# Patient Record
Sex: Male | Born: 1935 | Race: White | Hispanic: No | Marital: Single | Smoking: Former smoker
Health system: Southern US, Community
[De-identification: ages and names within clinical notes are randomized; demographics above are authoritative.]

## PROBLEM LIST (undated history)

## (undated) DIAGNOSIS — N390 Urinary tract infection, site not specified: Secondary | ICD-10-CM

## (undated) DIAGNOSIS — K219 Gastro-esophageal reflux disease without esophagitis: Secondary | ICD-10-CM

## (undated) DIAGNOSIS — E785 Hyperlipidemia, unspecified: Secondary | ICD-10-CM

## (undated) DIAGNOSIS — I219 Acute myocardial infarction, unspecified: Secondary | ICD-10-CM

## (undated) DIAGNOSIS — K851 Biliary acute pancreatitis without necrosis or infection: Secondary | ICD-10-CM

## (undated) DIAGNOSIS — I2510 Atherosclerotic heart disease of native coronary artery without angina pectoris: Secondary | ICD-10-CM

## (undated) DIAGNOSIS — D696 Thrombocytopenia, unspecified: Secondary | ICD-10-CM

## (undated) DIAGNOSIS — N183 Chronic kidney disease, stage 3 unspecified: Secondary | ICD-10-CM

## (undated) DIAGNOSIS — I5042 Chronic combined systolic (congestive) and diastolic (congestive) heart failure: Secondary | ICD-10-CM

## (undated) DIAGNOSIS — H269 Unspecified cataract: Secondary | ICD-10-CM

## (undated) DIAGNOSIS — M1990 Unspecified osteoarthritis, unspecified site: Secondary | ICD-10-CM

## (undated) DIAGNOSIS — Z9981 Dependence on supplemental oxygen: Secondary | ICD-10-CM

## (undated) DIAGNOSIS — C801 Malignant (primary) neoplasm, unspecified: Secondary | ICD-10-CM

## (undated) DIAGNOSIS — I10 Essential (primary) hypertension: Secondary | ICD-10-CM

## (undated) DIAGNOSIS — I739 Peripheral vascular disease, unspecified: Secondary | ICD-10-CM

## (undated) DIAGNOSIS — E875 Hyperkalemia: Secondary | ICD-10-CM

## (undated) DIAGNOSIS — G4730 Sleep apnea, unspecified: Secondary | ICD-10-CM

## (undated) DIAGNOSIS — M109 Gout, unspecified: Secondary | ICD-10-CM

## (undated) HISTORY — DX: Hyperkalemia: E87.5

## (undated) HISTORY — DX: Essential (primary) hypertension: I10

## (undated) HISTORY — PX: UPPER GI ENDOSCOPY: SHX6162

## (undated) HISTORY — PX: EYE SURGERY: SHX253

## (undated) HISTORY — DX: Unspecified cataract: H26.9

## (undated) HISTORY — PX: APPENDECTOMY: SHX54

## (undated) HISTORY — DX: Atherosclerotic heart disease of native coronary artery without angina pectoris: I25.10

## (undated) HISTORY — DX: Sleep apnea, unspecified: G47.30

## (undated) HISTORY — PX: HERNIA REPAIR: SHX51

## (undated) HISTORY — DX: Hyperlipidemia, unspecified: E78.5

## (undated) HISTORY — PX: CHOLECYSTECTOMY: SHX55

## (undated) HISTORY — PX: OTHER SURGICAL HISTORY: SHX169

## (undated) HISTORY — DX: Thrombocytopenia, unspecified: D69.6

## (undated) HISTORY — PX: PARTIAL COLECTOMY: SHX5273

## (undated) HISTORY — DX: Acute myocardial infarction, unspecified: I21.9

## (undated) HISTORY — PX: COLONOSCOPY: SHX174

## (undated) HISTORY — PX: BACK SURGERY: SHX140

## (undated) HISTORY — DX: Unspecified osteoarthritis, unspecified site: M19.90

---

## 1990-10-11 DIAGNOSIS — E119 Type 2 diabetes mellitus without complications: Secondary | ICD-10-CM | POA: Insufficient documentation

## 1995-10-12 DIAGNOSIS — I219 Acute myocardial infarction, unspecified: Secondary | ICD-10-CM

## 1995-10-12 HISTORY — PX: CORONARY ARTERY BYPASS GRAFT: SHX141

## 1995-10-12 HISTORY — DX: Acute myocardial infarction, unspecified: I21.9

## 1998-07-09 ENCOUNTER — Other Ambulatory Visit: Admission: RE | Admit: 1998-07-09 | Discharge: 1998-07-09 | Payer: Self-pay | Admitting: Gastroenterology

## 2000-11-22 ENCOUNTER — Emergency Department (HOSPITAL_COMMUNITY): Admission: EM | Admit: 2000-11-22 | Discharge: 2000-11-22 | Payer: Self-pay

## 2000-11-23 ENCOUNTER — Ambulatory Visit (HOSPITAL_COMMUNITY): Admission: RE | Admit: 2000-11-23 | Discharge: 2000-11-23 | Payer: Self-pay | Admitting: Gastroenterology

## 2000-11-23 ENCOUNTER — Encounter: Payer: Self-pay | Admitting: Gastroenterology

## 2000-11-25 ENCOUNTER — Observation Stay (HOSPITAL_COMMUNITY): Admission: RE | Admit: 2000-11-25 | Discharge: 2000-11-26 | Payer: Self-pay | Admitting: Gastroenterology

## 2000-11-25 ENCOUNTER — Encounter: Payer: Self-pay | Admitting: Gastroenterology

## 2000-11-25 ENCOUNTER — Encounter (INDEPENDENT_AMBULATORY_CARE_PROVIDER_SITE_OTHER): Payer: Self-pay | Admitting: Specialist

## 2000-11-28 ENCOUNTER — Ambulatory Visit (HOSPITAL_COMMUNITY): Admission: RE | Admit: 2000-11-28 | Discharge: 2000-11-28 | Payer: Self-pay

## 2001-07-10 ENCOUNTER — Other Ambulatory Visit: Admission: RE | Admit: 2001-07-10 | Discharge: 2001-07-10 | Payer: Self-pay | Admitting: Gastroenterology

## 2001-07-10 ENCOUNTER — Encounter (INDEPENDENT_AMBULATORY_CARE_PROVIDER_SITE_OTHER): Payer: Self-pay

## 2003-10-12 DIAGNOSIS — Z86010 Personal history of colonic polyps: Secondary | ICD-10-CM | POA: Insufficient documentation

## 2003-10-12 DIAGNOSIS — C801 Malignant (primary) neoplasm, unspecified: Secondary | ICD-10-CM

## 2003-10-12 HISTORY — DX: Malignant (primary) neoplasm, unspecified: C80.1

## 2003-10-12 HISTORY — PX: CARDIAC CATHETERIZATION: SHX172

## 2004-07-21 ENCOUNTER — Inpatient Hospital Stay (HOSPITAL_COMMUNITY): Admission: EM | Admit: 2004-07-21 | Discharge: 2004-07-25 | Payer: Self-pay | Admitting: Emergency Medicine

## 2006-05-03 ENCOUNTER — Emergency Department (HOSPITAL_COMMUNITY): Admission: EM | Admit: 2006-05-03 | Discharge: 2006-05-03 | Payer: Self-pay | Admitting: Emergency Medicine

## 2009-10-11 DIAGNOSIS — D696 Thrombocytopenia, unspecified: Secondary | ICD-10-CM

## 2009-10-11 HISTORY — PX: CARDIAC CATHETERIZATION: SHX172

## 2009-10-11 HISTORY — DX: Thrombocytopenia, unspecified: D69.6

## 2009-11-14 ENCOUNTER — Encounter: Admission: RE | Admit: 2009-11-14 | Discharge: 2009-11-14 | Payer: Self-pay | Admitting: Cardiology

## 2009-11-21 ENCOUNTER — Inpatient Hospital Stay (HOSPITAL_BASED_OUTPATIENT_CLINIC_OR_DEPARTMENT_OTHER): Admission: RE | Admit: 2009-11-21 | Discharge: 2009-11-21 | Payer: Self-pay | Admitting: Cardiology

## 2009-11-25 ENCOUNTER — Inpatient Hospital Stay (HOSPITAL_COMMUNITY): Admission: RE | Admit: 2009-11-25 | Discharge: 2009-11-26 | Payer: Self-pay | Admitting: Cardiology

## 2010-05-21 ENCOUNTER — Ambulatory Visit: Payer: Self-pay | Admitting: Cardiology

## 2010-06-04 ENCOUNTER — Ambulatory Visit: Payer: Self-pay | Admitting: Cardiology

## 2010-12-30 LAB — BASIC METABOLIC PANEL
BUN: 22 mg/dL (ref 6–23)
CO2: 26 mEq/L (ref 19–32)
Calcium: 8.8 mg/dL (ref 8.4–10.5)
Chloride: 104 mEq/L (ref 96–112)
Creatinine, Ser: 1.3 mg/dL (ref 0.4–1.5)
GFR calc Af Amer: >60 mL/min (ref >60)
GFR calc non Af Amer: 54 mL/min — ABNORMAL LOW (ref >60)
Glucose, Bld: 156 mg/dL — ABNORMAL HIGH (ref 70–99)
Potassium: 4.7 mEq/L (ref 3.5–5.1)
Sodium: 138 mEq/L (ref 135–145)

## 2010-12-30 LAB — GLUCOSE, CAPILLARY
Glucose-Capillary: 118 mg/dL — ABNORMAL HIGH (ref 70–99)
Glucose-Capillary: 145 mg/dL — ABNORMAL HIGH (ref 70–99)
Glucose-Capillary: 155 mg/dL — ABNORMAL HIGH (ref 70–99)
Glucose-Capillary: 74 mg/dL (ref 70–99)

## 2010-12-30 LAB — CBC
HCT: 40.3 % (ref 39.0–52.0)
Hemoglobin: 13.8 g/dL (ref 13.0–17.0)
MCHC: 34.2 g/dL (ref 30.0–36.0)
MCV: 93.6 fL (ref 78.0–100.0)
Platelets: 94 10*3/uL — ABNORMAL LOW (ref 150–400)
RBC: 4.31 MIL/uL (ref 4.22–5.81)
RDW: 14.6 % (ref 11.5–15.5)
WBC: 6.1 10*3/uL (ref 4.0–10.5)

## 2011-02-26 NOTE — Discharge Summary (Signed)
NAMEMOBEEN, KUTZNER NO.:  [id number]   MEDICAL RECORD NO.:  [id number]          PATIENT TYPE:  INP   LOCATION:  2928                         FACILITY:  [facility]   PHYSICIAN:  Kaylyn Lim., M.D.DATE OF BIRTH:  1933/04/16   DATE OF ADMISSION:  07/21/2004  DATE OF DISCHARGE:  07/25/2004                                 DISCHARGE SUMMARY   DISCHARGE DIAGNOSES:  1.  Non-ST elevation myocardial infarction, peak troponin 0.9.  2.  Coronary artery disease with 99% blockage in the vein graft to PLV      artery, status post successful percutaneous coronary intervention and      Taxus stent placement.  3.  Dyslipidemia.  4.  Diabetes mellitus.  5.  Hypertension.   HISTORY OF PRESENT ILLNESS AND HOSPITAL COURSE:  The patient is a 77-year-  old white male admitted with non-ST elevation MI.  He underwent cardiac  catheterization on July 23, 2004 which showed significant native vessel  disease as well as disease to his vein grafts.  The vein graft to the PDA  had an ostial 40% stenosis followed by proximal 50-60% stenosis.  Mid and  distal vein grafts had no significant disease.  Vein graft to the PLV  artery.  He had a 99% proximal stenosis with  TIMI-1 flow in the mid and  distal vessels.  It was noted that the LAD was patent without any  significant disease.  Due to contrast load and the patient's baseline  creatinine of 1.4, PCI was staged for July 24, 2004.  At that time the  patient underwent successful PCI to the vein graft to its PLV with Taxus  stent placement x2.  With a 3.5 x 16 mm Taxus stent deployed followed by 3.5  x 12 mm Taxus stent deployed.  The patient had no residual stenosis and TIMI-  3 flow throughout the vein graft.   Post procedure the patient did well.  He has had no significant complaints.  He has been ambulatory in the room and tolerating p.o. diet well.  He will  be discharged home today to continue the following medications.   DISCHARGE MEDICATIONS:  1.  Lisinopril 40 mg daily.  2.  Glucotrol XL 5 mg daily.  3.  Zocor 40 mg daily.  4.  Protonix 40 mg daily.  5.  Metoprolol 25 mg b.i.d.  6.  Plavix 75 mg daily.  7.  Aspirin 325 mg daily.  8.  Insulin as before.  9.  Pain management is Tylenol 500 mg q.6h. p.r.n.   ACTIVITY:  The patient was advised to do no strenuous activity for 48 hours  and to restrict his normal workouts for one week.   DIET:  ADA 2000 calorie.   WOUND CARE:  Routine post catheterization care.   FOLLOW UP:  He will follow up with [facility] in one to two weeks  with either Dr. Verlon Setting, Dr. Doreatha Lew or Dr. Martinique.  The patient will call  the office at [phone] for an appointment.  All the patient's questions were  answered prior  to discharge.       TWK/MEDQ  D:  07/25/2004  T:  07/25/2004  Job:  [id number]

## 2011-02-26 NOTE — Op Note (Signed)
[facility]  Patient:    Scott [name], Scott [name]                         MRN: [id number] Proc. Date: 11/25/00 Adm. Date:  [id number] Attending:  Tiffany Kocher CC:         Sandy Salaam. Deatra Ina, M.D. [facility]   Operative Report  PREOPERATIVE DIAGNOSIS:  Acute cholecystitis and cholelithiasis.  POSTOPERATIVE DIAGNOSIS:  Acute gangrenous cholecystitis and cholelithiasis.  OPERATION:  Laparoscopic cholecystectomy.  SURGEON:  Ward Givens, M.D.  ANESTHESIA: General.  DESCRIPTION OF PROCEDURE:  After adequate induction of anesthesia, adequate monitoring, and routine preparation and draping of the abdomen, I made a short transverse infraumbilical incision and dissected down a small umbilical hernia, opened it, reduced the fat, and dissected down to the fascia.  The hernia defect was about needed size for putting in the scope.  I had good access to the peritoneal cavity.  I placed a pursestring O Vicryl suture and secured a Hasson cannula.  I then inflated the abdomen with CO2 and did a laparoscopy.  I found there was adhesion of omentum to the undersurface of the gallbladder and liver, but otherwise I saw no abnormalities.  After placement of three additional ports and positioning the patient head up, foot down, tilted to the left, I dissected the adhesions away from the fundus of the gallbladder.  I drained the gallbladder with suction aspirator so I could grasp it.  Then I grasped the fundus and elevated it toward the right shoulder.  There was a lot of adhesion to the undersurface of the gallbladder as it was very acutely inflamed.  The gallbladder was evidently necrotic, the integrity was not good, and there was some spillage of bile from it.  I sucked that out as I went along.  No gross stones were spilled.  I then dissected on down the gallbladder, placing another clamp on the infundibulum and retracting it to the right, and I was able to identify the  cystic artery, clip it with three clips, and cut between the two clips closer to the gallbladder.  I dissected on further and clearly saw the infundibulum of the gallbladder merging into the cystic duct.  I clipped the cystic duct with three clips and cut between the the two closer to the gallbladder.  I then elevated the gallbladder out of the gallbladder fossa.  As I came up toward the fundus, the gallbladder was quite necrotic, and integrity was poor, and I simply removed the majority of the gallbladder, leaving the posterior wall intact against the liver.  I cauterized the remaining mucosa, although it was necrotic.  I got hemostasis with the cautery.  I copiously irrigated the right upper quadrant and removed the irrigant.  I placed the gallbladder into a plastic pouch and removed it through the umbilical incision, then replaced the Hasson cannula and, under direct vision, placed a suction drain through one of the lateral ports into the gallbladder fossa and secured it was 2-0 nylon.  I then again inspected the area and found that there was good hemostasis and no evident leakage of bile.  I anesthetized all of the incisions with long-acting local anesthetic.  I closed the pursestring suture in the umbilical incision, and that closed it nicely.  I removed the CO2 and removed the other ports and closed all the incisions with intracuticular 4-0 Vicryl and Steri-Strips.  I applied bandages, and the patient went to  the recovery room in satisfactory condition. DD:  11/25/00 TD:  11/26/00 Job: [id number]

## 2011-02-26 NOTE — Cardiovascular Report (Signed)
[name], [name] NO.:  [id number]   MEDICAL RECORD NO.:  [id number]          PATIENT TYPE:  INP   LOCATION:  2034                         FACILITY:  [facility]   PHYSICIAN:  Kaylyn Lim., M.D.DATE OF BIRTH:  1933-01-02   DATE OF PROCEDURE:  07/23/2004  DATE OF DISCHARGE:                              CARDIAC CATHETERIZATION   PROCEDURES:  Left heart catheterization, graft angiogram, LIMA angiogram, LV  angiogram.   DESCRIPTION OF PROCEDURE:  The patient was brought to the cardiac  catheterization lab after appropriate and informed consent.  He was prepped  and draped in sterile fashion.  A 6-French sheath was placed in the right  femoral artery without difficulty.  Coronary angiography, __________ graft  angiography, LIMA angiography and LV angiography were then performed without  difficulty.   RESULTS:  Left Main:  Long lung vessel with a distal 30-40% stenosis.  LAD:  Has a proximal 60-70% diffuse disease followed by 100% mid occlusion.  Distal vessels seen with milking flow.  LCX:  Proximal 30-40% stenosis.  OM1:  Large vessel with minimal disease.  OM2:  Large vessel with minimal disease.  RCA:  Dominant with proximal ectasia with sequential 40-50% stenosis and  100% mid occlusion.   VEIN GRAFTS:  Vein Graft to the PDA:  Ostial 40% stenosis followed by a  proximal hazy 50-60% stenosis.  Mid and distal vein graft without  significant disease.  Fill in a PDA with no significant disease.  Vein Graft to the PLV:  Has a proximal 99% stenosis with a TIMI I flow in  the mid and distal vessel filling a PLV vessel with mild luminal  irregularities.  LIMA to LAD is patent with no significant disease filling  the mid LAD and back filling small diagonals.  LV:  EF was 45-50% with moderate to severe diaphragmatic hypokinesis.  LVEDP  preinjection was 14 mmHg, post was 32 mmHg.   IMPRESSION:  1.  Obstructive native three vessel disease.  2.  Patent vein graft  to the PDA with a 50-60% proximal stenosis.  Vein      graft to the PLV with 99% proximal stenosis and patent LIMA to the LAD      with no significant blockage.  3.  Mild LV dysfunction with an EF of 45-50% and moderate to severe      diaphragmatic hypokinesis.   PLAN:  1.  Maximal medical therapy including heparin, Integrilin, aspirin, Plavix,      nitroglycerin and beta blockade.  2.  Staged intervention of vein graft to the PLV tomorrow secondary to      patient with renal insufficiency based on creatinine of 1.4.      Approximately 155 cc of Omnipaque dye was used during the procedure.      The patient tolerated the procedure well and was transported to the      holding area in stable condition.       TWK/MEDQ  D:  07/23/2004  T:  07/23/2004  Job:  [id number]

## 2011-02-26 NOTE — Cardiovascular Report (Signed)
NAMEDENZALE, MOYA NO.:  [id number]   MEDICAL RECORD NO.:  [id number]          PATIENT TYPE:  INP   LOCATION:  2928                         FACILITY:  [facility]   PHYSICIAN:  Peter M. Martinique, M.D.  DATE OF BIRTH:  06/05/1933   DATE OF PROCEDURE:  DATE OF DISCHARGE:                              CARDIAC CATHETERIZATION   INDICATIONS FOR PROCEDURE:  The patient is a 77 year old white male, status  post CABG eight years ago presents with a non-Q-wave myocardial infarction.  He has a critical lesion in the proximal saphenous vein graft  to the  posterolateral branch of the right coronary artery.  The procedure is  percutaneous stenting of the vein graft to the posterolateral branch.  Access is via the right femoral artery using the standard Seldinger  technique.   EQUIPMENT:  1.  6-French arterial sheath.  2.  6-French multi-purpose A-1 guide.  3.  A filter wire EZ.  4.  A 3.5 x 16 mm Taxus drug-eluting stent in a 3.5 x 12 mm Taxus drug-      eluting stent.   MEDICATIONS:  1.  Nitroglycerin drip.  2.  Nitroglycerin 200 mcg intracoronary x2.  3.  Heparin 4800 units IV.  4.  Integrilin drip was continued to 0.99 mcg/kg/per minute.  5.  ACT post heparin was 262.   DESCRIPTION OF PROCEDURE:  After initial __________  were obtained, we first  crossed the lesion with a filter wire.  The basket on the filter wire was  deployed in the distal saphenous vein graft.  We then primarily stented the  lesion of the proximal vein graft using a 3.5 x 16 mm Taxus stent.  This was  deployed at 8 atmospheres and then post dilated to 12 atmospheres.  This  yielded an excellent result to the stent site. However, at the distal stent  margin, there was a focal intimal disruption/dissection.  We next covered  this using an additional 3.5 x 12 mm Taxus stent.  This was again deployed  at 8 atmospheres post dilated to 12 atmospheres.  This yielded an excellent  angiographic result with  0% residual stenosis and a smooth appearance.  There was TIMI-3 flow to the far distal vessels.  No filling defects were  noted.  We then extracted the filter wire and examination revealed a small  amount of bland debris within the filter.  The patient tolerated the  procedure well without chest pain or ECG changes.   FINAL INTERPRETATION:  Successful stenting of the proximal saphenous vein  graft to the posterolateral branch of the right coronary artery.       PMJ/MEDQ  D:  07/24/2004  T:  07/24/2004  Job:  [id number]   cc:   Ludwig Lean. Doreatha Lew, M.D.  Fax: [phone]   Kaylyn Lim., M.D.  [address]  [place], [place] [address]  Fax: [phone]
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
## 2011-03-31 ENCOUNTER — Encounter: Payer: Self-pay | Admitting: *Deleted

## 2011-03-31 ENCOUNTER — Ambulatory Visit: Payer: Self-pay | Admitting: Cardiology

## 2011-04-01 ENCOUNTER — Encounter: Payer: Self-pay | Admitting: *Deleted

## 2011-04-07 ENCOUNTER — Ambulatory Visit (INDEPENDENT_AMBULATORY_CARE_PROVIDER_SITE_OTHER): Payer: Medicare Other | Admitting: Cardiology

## 2011-04-07 ENCOUNTER — Encounter: Payer: Self-pay | Admitting: Cardiology

## 2011-04-07 VITALS — BP 122/72 | HR 54 | Ht 72.0 in | Wt 217.0 lb

## 2011-04-07 DIAGNOSIS — E119 Type 2 diabetes mellitus without complications: Secondary | ICD-10-CM | POA: Insufficient documentation

## 2011-04-07 DIAGNOSIS — I219 Acute myocardial infarction, unspecified: Secondary | ICD-10-CM | POA: Insufficient documentation

## 2011-04-07 DIAGNOSIS — E785 Hyperlipidemia, unspecified: Secondary | ICD-10-CM

## 2011-04-07 DIAGNOSIS — I2510 Atherosclerotic heart disease of native coronary artery without angina pectoris: Secondary | ICD-10-CM

## 2011-04-07 DIAGNOSIS — I259 Chronic ischemic heart disease, unspecified: Secondary | ICD-10-CM

## 2011-04-07 DIAGNOSIS — I255 Ischemic cardiomyopathy: Secondary | ICD-10-CM | POA: Insufficient documentation

## 2011-04-07 DIAGNOSIS — I10 Essential (primary) hypertension: Secondary | ICD-10-CM

## 2011-04-07 NOTE — Patient Instructions (Signed)
Continue your current medications.  Get me a copy of your lab work from the [facility] when you go.  I will see you back in 6 months.

## 2011-04-07 NOTE — Progress Notes (Signed)
[name] Date of Birth: 1933-06-16   History of Present Illness: Scott [name] is seen for assumption of care. He is a former patient of Dr. Doreatha Lew. He has a history of coronary disease and is status post myocardial infarction 1997. He at coronary bypass surgery x3 at that time. This included an LIMA graft to LAD, saphenous vein graft to the PDA, saphenous vein graft to the posterior lateral branch. In October 2005 he had stenting of the vein graft to the posterior lateral branch. In 2011 he had stenting of the proximal portion of this vein graft to the posterior lateral branch. On followup today he states he is feeling well. [facility]. He denies any significant left arm pain or chest pain which was his prior anginal symptom.   Current Outpatient Prescriptions on File Prior to Visit  Medication Sig Dispense Refill  . allopurinol (ZYLOPRIM) 100 MG tablet Take 200 mg by mouth daily.       Marland Kitchen aspirin 81 MG EC tablet Take 81 mg by mouth daily.        . clopidogrel (PLAVIX) 75 MG tablet Take 75 mg by mouth daily.        . colchicine 0.6 MG tablet Take 0.6 mg by mouth as needed.        . diclofenac (VOLTAREN) 75 MG EC tablet Take 75 mg by mouth daily.        . hydrochlorothiazide 25 MG tablet Take 12.5 mg by mouth daily.       . insulin aspart (NOVOLOG) 100 UNIT/ML injection Inject into the skin as directed.        . labetalol (NORMODYNE) 200 MG tablet Take 200 mg by mouth 2 (two) times daily.        . Multiple Vitamin (MULTIVITAMIN) tablet Take 1 tablet by mouth daily.        Marland Kitchen OMEPRAZOLE PO Take 1 capsule by mouth daily.        . simvastatin (ZOCOR) 20 MG tablet Take 10 mg by mouth at bedtime.          No Known Allergies  Past Medical History  Diagnosis Date  . Hypertension   . Diabetes mellitus   . Ischemic heart disease     dates back to 1997 --   . Myocardial infarction     had CABG x3 -- left internal mammary to the LAD, vein graft to the  posterior descending, and a vein graft to the posterior lateral branch  . Hyperkalemia   . Thrombocytopenia   . Shortness of breath   . Dyslipidemia   . Arthritis   . Gout     Past Surgical History  Procedure Date  . Coronary artery bypass graft 1997     x3 -- left internal mammary to the LAD, vein graft to the posterior descending, and a vein graft to the posterior lateral branch  . Cardiac catheterization 2005    stenting of the vein graft to the posterior lateral per -- Dr. Martinique      . Cardiac catheterization 2011    showed right coronary, totally occluded LAD and severe stenosis in both saphenous vein graft to the posterior lateral and posterior descending  -- stenting of the proximal portion of the SVG to the posterior lateral branch in 2/11  . Appendectomy   . Cholecystectomy     History  Smoking status  . Former Smoker  . Quit  date: 10/11/1986  Smokeless tobacco  . Not on file    History  Alcohol Use No    Family History  Problem Relation Age of Onset  . Coronary artery disease Father   . Heart attack Father   . Coronary artery disease Brother     1/2 brother   . Kidney disease Sister   . Cancer Cousin   . Cardiomyopathy Mother     Review of Systems: The review of systems is positive for elevated blood sugars particularly morning sometimes up to 200. He is on insulin therapy. His diabetes and medical problems are managed at the [facility].  All other systems were reviewed and are negative.  Physical Exam: BP 122/72  Pulse 54  Ht 6' (1.829 m)  Wt 217 lb (98.431 kg)  BMI 29.43 kg/m2 He is an overweight white male in no acute distress. He is normocephalic, atraumatic. Pupils are equal round and reactive to light and accommodation. Sclera are clear. Oropharynx is clear. Neck is supple without JVD, adenopathy, thyromegaly, or bruits. Lungs are clear. Cardiac exam reveals a regular rate and rhythm without gallop, murmur, or click. Abdomen is obese, soft, nontender. He  has no masses or bruits. Femoral and pedal pulses are 2+ and symmetric. He has no significant edema. He is alert and oriented x3. Cranial nerves II through XII are intact. LABORATORY DATA: ECG demonstrates sinus bradycardia with sinus arrhythmia. He has nonspecific ST and T-wave changes which are unchanged from January of 2011.  Assessment / Plan:

## 2011-04-07 NOTE — Assessment & Plan Note (Signed)
Blood pressure control is acceptable on current medications.

## 2011-04-07 NOTE — Assessment & Plan Note (Signed)
He is having no significant anginal symptoms at this time. We need to continue aggressive risk factor modification. Given his significant vein graft disease with prior stents I would recommend dual antiplatelet therapy indefinitely.

## 2011-04-07 NOTE — Assessment & Plan Note (Signed)
I have requested a copy of his most recent lab work from the [facility]. I will plan to followup again in 6 months.

## 2011-04-08 ENCOUNTER — Encounter: Payer: Self-pay | Admitting: Cardiology

## 2011-11-03 ENCOUNTER — Ambulatory Visit (INDEPENDENT_AMBULATORY_CARE_PROVIDER_SITE_OTHER): Payer: Medicare Other

## 2011-11-03 ENCOUNTER — Encounter: Payer: Self-pay | Admitting: Cardiology

## 2011-11-03 DIAGNOSIS — E119 Type 2 diabetes mellitus without complications: Secondary | ICD-10-CM

## 2011-11-03 DIAGNOSIS — I2510 Atherosclerotic heart disease of native coronary artery without angina pectoris: Secondary | ICD-10-CM

## 2011-11-03 DIAGNOSIS — R0602 Shortness of breath: Secondary | ICD-10-CM

## 2011-11-03 DIAGNOSIS — R059 Cough, unspecified: Secondary | ICD-10-CM

## 2011-11-03 DIAGNOSIS — R05 Cough: Secondary | ICD-10-CM

## 2011-11-05 ENCOUNTER — Encounter: Payer: Self-pay | Admitting: Cardiology

## 2011-11-05 ENCOUNTER — Ambulatory Visit (INDEPENDENT_AMBULATORY_CARE_PROVIDER_SITE_OTHER): Payer: Medicare Other | Admitting: Cardiology

## 2011-11-05 VITALS — BP 118/60 | HR 53 | Ht 72.0 in | Wt 213.4 lb

## 2011-11-05 DIAGNOSIS — R0600 Dyspnea, unspecified: Secondary | ICD-10-CM | POA: Insufficient documentation

## 2011-11-05 DIAGNOSIS — R0989 Other specified symptoms and signs involving the circulatory and respiratory systems: Secondary | ICD-10-CM

## 2011-11-05 DIAGNOSIS — I2510 Atherosclerotic heart disease of native coronary artery without angina pectoris: Secondary | ICD-10-CM

## 2011-11-05 DIAGNOSIS — R0609 Other forms of dyspnea: Secondary | ICD-10-CM

## 2011-11-05 NOTE — Assessment & Plan Note (Signed)
I suspect his symptoms are more related to pulmonary process given his recent flulike illness. However there is concern of component of congestive heart failure. His BNP level is elevated. His JVD is elevated. His lungs are clear today and he has no significant edema. I agree with the addition of diuretic therapy. We will obtain an echocardiogram. Prior LV function was mildly reduced related to his remote heart attack. I'll followup again in one month.

## 2011-11-05 NOTE — Assessment & Plan Note (Signed)
Prior coronary history as noted. He has no significant anginal symptoms. We will continue with his dual antiplatelet therapy and lipid-lowering therapy.

## 2011-11-05 NOTE — Progress Notes (Signed)
Scott [name] Date of Birth: Apr 29, 1933   History of Present Illness: [name] is seen for a followup visit. He has a history of coronary disease and is status post myocardial infarction 1997. He at coronary bypass surgery x3 at that time. This included an LIMA graft to LAD, saphenous vein graft to the PDA, saphenous vein graft to the posterior lateral branch. In October 2005 he had stenting of the vein graft to the posterior lateral branch. In 2011 he had stenting of the proximal portion of this vein graft to the posterior lateral branch. . He denies any significant left arm pain or chest pain which was his prior anginal symptom.  Around Christmastime he had an episode of flu. Since then he is still had persistent chest congestion and some cough. He did have a chest x-ray done at urgent medical care which by report showed a question left lower lobe retrocardiac infiltrate. There were some increased markings throughout. He had cardiomegaly. A BNP level was also done and was 890. We have no baseline for comparison. He was started on antibiotics and diuretic therapy. He feels that his symptoms are improving.  Current Outpatient Prescriptions on File Prior to Visit  Medication Sig Dispense Refill  . allopurinol (ZYLOPRIM) 100 MG tablet Take 200 mg by mouth daily.       Marland Kitchen aspirin 81 MG EC tablet Take 81 mg by mouth daily.        . clopidogrel (PLAVIX) 75 MG tablet Take 75 mg by mouth daily.        . colchicine 0.6 MG tablet Take 0.6 mg by mouth as needed.        . diclofenac (VOLTAREN) 75 MG EC tablet Take 75 mg by mouth daily.        . hydrochlorothiazide 25 MG tablet Take 12.5 mg by mouth daily.       . insulin aspart (NOVOLOG) 100 UNIT/ML injection Inject into the skin as directed.        . labetalol (NORMODYNE) 200 MG tablet Take 200 mg by mouth 2 (two) times daily.        . Multiple Vitamin (MULTIVITAMIN) tablet Take 1 tablet by mouth daily.        Marland Kitchen OMEPRAZOLE PO Take 1 capsule by mouth  daily.        . simvastatin (ZOCOR) 20 MG tablet Take 10 mg by mouth at bedtime.          No Known Allergies  Past Medical History  Diagnosis Date  . Hypertension   . Diabetes mellitus   . Ischemic heart disease     dates back to 1997 --   . Myocardial infarction     had CABG x3 -- left internal mammary to the LAD, vein graft to the posterior descending, and a vein graft to the posterior lateral branch  . Hyperkalemia   . Thrombocytopenia   . Shortness of breath   . Dyslipidemia   . Arthritis   . Gout   . CAD (coronary artery disease)     Past Surgical History  Procedure Date  . Coronary artery bypass graft 1997     x3 -- left internal mammary to the LAD, vein graft to the posterior descending, and a vein graft to the posterior lateral branch  . Cardiac catheterization 2005    stenting of the vein graft to the posterior lateral per -- Dr. Martinique      . Cardiac catheterization 2011  showed right coronary, totally occluded LAD and severe stenosis in both saphenous vein graft to the posterior lateral and posterior descending  -- stenting of the proximal portion of the SVG to the posterior lateral branch in 2/11  . Appendectomy   . Cholecystectomy     History  Smoking status  . Former Smoker  . Quit date: 10/11/1986  Smokeless tobacco  . Not on file    History  Alcohol Use No    Family History  Problem Relation Age of Onset  . Coronary artery disease Father   . Heart attack Father   . Coronary artery disease Brother     1/2 brother   . Kidney disease Sister   . Cancer Cousin   . Cardiomyopathy Mother     Review of Systems: As noted in history of present illness All other systems were reviewed and are negative.  Physical Exam: BP 118/60  Pulse 53  Ht 6' (1.829 m)  Wt 213 lb 6.4 oz (96.798 kg)  BMI 28.94 kg/m2 He is an overweight white male in no acute distress. He is normocephalic, atraumatic. Pupils are equal round and reactive to light and  accommodation. Sclera are clear. Oropharynx is clear. Neck is supple with JVD to 6 cm. There is no adenopathy, thyromegaly, or bruits. Lungs are clear. Cardiac exam reveals a regular rate and rhythm without gallop, murmur, or click. Abdomen is obese, soft, nontender. He has no masses or bruits. Femoral and pedal pulses are 2+ and symmetric. He has no significant edema. He is alert and oriented x3. Cranial nerves II through XII are intact. LABORATORY DATA: ECG demonstrates sinus bradycardia with first degree AV block. He has nonspecific ST and T-wave changes. Compared to prior ECG tracings there is increased T-wave inversion in the lateral leads but with less ST depression.  Assessment / Plan:

## 2011-11-05 NOTE — Patient Instructions (Signed)
We will schedule you for an echocardiogram.  Continue your current medication.  I will see you again in 1 month.

## 2011-11-12 ENCOUNTER — Ambulatory Visit (HOSPITAL_COMMUNITY): Payer: Medicare Other | Attending: Cardiology | Admitting: Radiology

## 2011-11-12 DIAGNOSIS — Z8249 Family history of ischemic heart disease and other diseases of the circulatory system: Secondary | ICD-10-CM | POA: Insufficient documentation

## 2011-11-12 DIAGNOSIS — I2510 Atherosclerotic heart disease of native coronary artery without angina pectoris: Secondary | ICD-10-CM

## 2011-11-12 DIAGNOSIS — R0609 Other forms of dyspnea: Secondary | ICD-10-CM | POA: Insufficient documentation

## 2011-11-12 DIAGNOSIS — I10 Essential (primary) hypertension: Secondary | ICD-10-CM | POA: Insufficient documentation

## 2011-11-12 DIAGNOSIS — I252 Old myocardial infarction: Secondary | ICD-10-CM | POA: Insufficient documentation

## 2011-11-12 DIAGNOSIS — R0600 Dyspnea, unspecified: Secondary | ICD-10-CM

## 2011-11-12 DIAGNOSIS — E119 Type 2 diabetes mellitus without complications: Secondary | ICD-10-CM | POA: Insufficient documentation

## 2011-11-12 DIAGNOSIS — Z87891 Personal history of nicotine dependence: Secondary | ICD-10-CM | POA: Insufficient documentation

## 2011-11-12 DIAGNOSIS — E785 Hyperlipidemia, unspecified: Secondary | ICD-10-CM | POA: Insufficient documentation

## 2011-11-12 DIAGNOSIS — R0989 Other specified symptoms and signs involving the circulatory and respiratory systems: Secondary | ICD-10-CM | POA: Insufficient documentation

## 2011-11-12 DIAGNOSIS — I2589 Other forms of chronic ischemic heart disease: Secondary | ICD-10-CM | POA: Insufficient documentation

## 2011-11-12 DIAGNOSIS — I517 Cardiomegaly: Secondary | ICD-10-CM | POA: Insufficient documentation

## 2011-12-10 ENCOUNTER — Ambulatory Visit (INDEPENDENT_AMBULATORY_CARE_PROVIDER_SITE_OTHER): Payer: Medicare Other | Admitting: Cardiology

## 2011-12-10 ENCOUNTER — Encounter: Payer: Self-pay | Admitting: Cardiology

## 2011-12-10 VITALS — BP 130/71 | HR 59 | Ht 72.0 in | Wt 215.0 lb

## 2011-12-10 DIAGNOSIS — R0609 Other forms of dyspnea: Secondary | ICD-10-CM

## 2011-12-10 DIAGNOSIS — E785 Hyperlipidemia, unspecified: Secondary | ICD-10-CM

## 2011-12-10 DIAGNOSIS — R0989 Other specified symptoms and signs involving the circulatory and respiratory systems: Secondary | ICD-10-CM

## 2011-12-10 DIAGNOSIS — I2789 Other specified pulmonary heart diseases: Secondary | ICD-10-CM

## 2011-12-10 DIAGNOSIS — I2510 Atherosclerotic heart disease of native coronary artery without angina pectoris: Secondary | ICD-10-CM

## 2011-12-10 DIAGNOSIS — R0600 Dyspnea, unspecified: Secondary | ICD-10-CM

## 2011-12-10 DIAGNOSIS — I509 Heart failure, unspecified: Secondary | ICD-10-CM

## 2011-12-10 DIAGNOSIS — I5032 Chronic diastolic (congestive) heart failure: Secondary | ICD-10-CM

## 2011-12-10 DIAGNOSIS — I272 Pulmonary hypertension, unspecified: Secondary | ICD-10-CM

## 2011-12-10 NOTE — Patient Instructions (Signed)
Continue your current medications. Exercise regularly.  Avoid sodium in your diet. Be careful to avoid canned or processed foods  I will see you again in 6 months.  Call if you have more shortness of breath or chest pain.

## 2011-12-10 NOTE — Progress Notes (Signed)
[name] Date of Birth: 1933-08-08   History of Present Illness: Mr. Pearson is seen for a followup visit. He has a history of coronary disease and is status post myocardial infarction 1997. He at coronary bypass surgery x3 at that time. This included an LIMA graft to LAD, saphenous vein graft to the PDA, saphenous vein graft to the posterior lateral branch. In October 2005 he had stenting of the vein graft to the posterior lateral branch. In 2011 he had stenting of the proximal portion of this vein graft to the posterior lateral branch. A with symptoms of increased dyspnea. This followed a flulike illness around Christmas time. He did have an elevated BNP level of 890. His chest x-ray did not show clear evidence of congestive heart failure. He states his breathing is back to his baseline now. He still notes some shortness of breath with exertion. He has had no increase in edema. He took Lasix for about a week but didn't really see any change.  Current Outpatient Prescriptions on File Prior to Visit  Medication Sig Dispense Refill  . allopurinol (ZYLOPRIM) 100 MG tablet Take 200 mg by mouth daily.       Marland Kitchen aspirin 81 MG EC tablet Take 81 mg by mouth daily.        . clopidogrel (PLAVIX) 75 MG tablet Take 75 mg by mouth daily.        . colchicine 0.6 MG tablet Take 0.6 mg by mouth as needed.        . diclofenac (VOLTAREN) 75 MG EC tablet Take 75 mg by mouth daily.        . hydrochlorothiazide 25 MG tablet Take 12.5 mg by mouth daily.       . insulin aspart (NOVOLOG) 100 UNIT/ML injection Inject into the skin as directed.        . labetalol (NORMODYNE) 200 MG tablet Take 200 mg by mouth 2 (two) times daily.        . Multiple Vitamin (MULTIVITAMIN) tablet Take 1 tablet by mouth daily.        Marland Kitchen OMEPRAZOLE PO Take 1 capsule by mouth daily.        . simvastatin (ZOCOR) 20 MG tablet Take 10 mg by mouth at bedtime.          No Known Allergies  Past Medical History  Diagnosis Date  . Hypertension    . Diabetes mellitus   . Ischemic heart disease     dates back to 1997 --   . Myocardial infarction     had CABG x3 -- left internal mammary to the LAD, vein graft to the posterior descending, and a vein graft to the posterior lateral branch  . Hyperkalemia   . Thrombocytopenia   . Shortness of breath   . Dyslipidemia   . Arthritis   . Gout   . CAD (coronary artery disease)     Past Surgical History  Procedure Date  . Coronary artery bypass graft 1997     x3 -- left internal mammary to the LAD, vein graft to the posterior descending, and a vein graft to the posterior lateral branch  . Cardiac catheterization 2005    stenting of the vein graft to the posterior lateral per -- Dr. Martinique      . Cardiac catheterization 2011    showed right coronary, totally occluded LAD and severe stenosis in both saphenous vein graft to the posterior lateral and posterior descending  -- stenting of  the proximal portion of the SVG to the posterior lateral branch in 2/11  . Appendectomy   . Cholecystectomy     History  Smoking status  . Former Smoker  . Quit date: 10/11/1986  Smokeless tobacco  . Not on file    History  Alcohol Use No    Family History  Problem Relation Age of Onset  . Coronary artery disease Father   . Heart attack Father   . Coronary artery disease Brother     1/2 brother   . Kidney disease Sister   . Cancer Cousin   . Cardiomyopathy Mother     Review of Systems: As noted in history of present illness All other systems were reviewed and are negative.  Physical Exam: BP 130/71  Pulse 59  Ht 6' (1.829 m)  Wt 215 lb (97.523 kg)  BMI 29.16 kg/m2 He is an overweight white male in no acute distress. He is normocephalic, atraumatic. Pupils are equal round and reactive to light and accommodation. Sclera are clear. Oropharynx is clear. Neck is supple with JVD to 6 cm. There is no adenopathy, thyromegaly, or bruits. Lungs are clear. Cardiac exam reveals a regular rate  and rhythm without gallop, murmur, or click. Abdomen is obese, soft, nontender. He has no masses or bruits. Femoral and pedal pulses are 2+ and symmetric. He has no significant edema. He is alert and oriented x3. Cranial nerves II through XII are intact. LABORATORY DATA: Echocardiogram demonstrated low-normal left ventricular function with ejection fraction of 50%. This is unchanged. He had moderate to severe left atrial enlargement with moderate pulmonary hypertension. Estimated RV systolic pressure was 60 mm of mercury.  Assessment / Plan:

## 2011-12-10 NOTE — Assessment & Plan Note (Signed)
This ventricular systolic function is low normal. He has a dilated left atrium and evidence of pulmonary hypertension. I suspect this is the primary cause of his ongoing dyspnea. He does not appear to be volume overloaded at this point. We will continue with his hydrochlorothiazide. I stressed the importance of sodium restriction in his diet. I have encouraged him to continue with his exercise. If he notices any increased weight or edema he may take Lasix as needed. I will followup again in 6 months.

## 2011-12-10 NOTE — Assessment & Plan Note (Signed)
He has no anginal symptoms at this time. He shortness of breath is back to baseline. We will defer further ischemic workup at this time. I will see him back again in 6 months. He is to call if he has any chest pain or if his shortness of breath increases.

## 2011-12-10 NOTE — Assessment & Plan Note (Signed)
His lab work is followed routinely at the [place]. He is on simvastatin. I asked that he bring a copy of his lab work on his next visit.

## 2012-01-18 ENCOUNTER — Encounter: Payer: Self-pay | Admitting: Nurse Practitioner

## 2012-01-18 ENCOUNTER — Ambulatory Visit (INDEPENDENT_AMBULATORY_CARE_PROVIDER_SITE_OTHER): Payer: Medicare Other | Admitting: Nurse Practitioner

## 2012-01-18 ENCOUNTER — Telehealth: Payer: Self-pay | Admitting: Cardiology

## 2012-01-18 ENCOUNTER — Other Ambulatory Visit: Payer: Self-pay | Admitting: *Deleted

## 2012-01-18 VITALS — BP 154/80 | HR 62 | Ht 72.0 in | Wt 217.0 lb

## 2012-01-18 DIAGNOSIS — R0600 Dyspnea, unspecified: Secondary | ICD-10-CM

## 2012-01-18 DIAGNOSIS — R0902 Hypoxemia: Secondary | ICD-10-CM

## 2012-01-18 DIAGNOSIS — R0609 Other forms of dyspnea: Secondary | ICD-10-CM

## 2012-01-18 DIAGNOSIS — R0989 Other specified symptoms and signs involving the circulatory and respiratory systems: Secondary | ICD-10-CM

## 2012-01-18 DIAGNOSIS — I5032 Chronic diastolic (congestive) heart failure: Secondary | ICD-10-CM

## 2012-01-18 DIAGNOSIS — I509 Heart failure, unspecified: Secondary | ICD-10-CM

## 2012-01-18 LAB — CBC WITH DIFFERENTIAL/PLATELET
Basophils Absolute: 0 10*3/uL (ref 0.0–0.1)
Basophils Relative: 0.5 % (ref 0.0–3.0)
Eosinophils Absolute: 0.2 10*3/uL (ref 0.0–0.7)
Eosinophils Relative: 2.7 % (ref 0.0–5.0)
HCT: 40.8 % (ref 39.0–52.0)
Hemoglobin: 13.1 g/dL (ref 13.0–17.0)
Lymphocytes Relative: 19.3 % (ref 12.0–46.0)
Lymphs Abs: 1.3 10*3/uL (ref 0.7–4.0)
MCHC: 32.2 g/dL (ref 30.0–36.0)
MCV: 91.3 fl (ref 78.0–100.0)
Monocytes Absolute: 1 10*3/uL (ref 0.1–1.0)
Monocytes Relative: 15.3 % — ABNORMAL HIGH (ref 3.0–12.0)
Neutro Abs: 4 10*3/uL (ref 1.4–7.7)
Neutrophils Relative %: 62.2 % (ref 43.0–77.0)
Platelets: 84 10*3/uL — ABNORMAL LOW (ref 150.0–400.0)
RBC: 4.47 Mil/uL (ref 4.22–5.81)
RDW: 17.4 % — ABNORMAL HIGH (ref 11.5–14.6)
WBC: 6.5 10*3/uL (ref 4.5–10.5)

## 2012-01-18 LAB — BASIC METABOLIC PANEL
BUN: 33 mg/dL — ABNORMAL HIGH (ref 6–23)
CO2: 26 mEq/L (ref 19–32)
Calcium: 9.1 mg/dL (ref 8.4–10.5)
Chloride: 104 mEq/L (ref 96–112)
Creatinine, Ser: 1.6 mg/dL — ABNORMAL HIGH (ref 0.4–1.5)
GFR: 45.42 mL/min — ABNORMAL LOW (ref >60.00)
Glucose, Bld: 131 mg/dL — ABNORMAL HIGH (ref 70–99)
Potassium: 4.9 mEq/L (ref 3.5–5.1)
Sodium: 139 mEq/L (ref 135–145)

## 2012-01-18 LAB — BRAIN NATRIURETIC PEPTIDE: Pro B Natriuretic peptide (BNP): 998 pg/mL — ABNORMAL HIGH (ref 0.0–100.0)

## 2012-01-18 MED ORDER — FUROSEMIDE 20 MG PO TABS: 20.0000 mg | ORAL_TABLET | Freq: Every day | ORAL | Status: DC

## 2012-01-18 NOTE — Assessment & Plan Note (Addendum)
Patient presents with DOE and PND. His oxygen sats at rest are 93%, with walking down the hall, he drops to 81%, and with oxygen and walking it goes to 94%. He did not feel all that short of breath. We are going to check BMET, BNP and CBC today. We will send him for a repeat CXR. We are going to arrange for a CT angio of the chest tomorrow (no current labs in order to do today). May need to consider referral to pulmonary as well. I have also started him on Lasix 20 mg daily. Oxygen has been arranged as well.  Further disposition to follow. Patient is agreeable to this plan and will call if any problems develop in the interim.   Addendum 01/19/12. His labs show some renal impairment. Will not be able to do the CT. Will try to arrange for a VQ scan. On phone conversation today, he reports that he is actually already on Lasix and not on HCTZ. We will increase the Lasix to 40 mg daily.

## 2012-01-18 NOTE — Progress Notes (Signed)
Scott [name] Date of Birth: 06-Mar-1933 Medical Record [id number]  History of Present Illness: Scott [name] is seen today for a work in visit. He is seen for Dr. Martinique. He has multiple medical issues which include CAD, prior MI in 1997, s/p CABG x 3 at that time with LIMA to LAD, SVG to PD, SVG to PL. In 2005, he had stenting of the SVG to the PL and in 2011 he had stenting of the proximal portion of this vein graft to the PL branch. He has chest and arm pain as his angina. His other issues include DM, HTN, pulmonary HTN, arthritis and gout.   He comes in today. He is here alone. He says he has just not done well since December. He thought he had the flu. He finally went to the Urgent Care in January. BNP was up. CXR was not too bad. He took some Lasix for about a week with no real change and slowly got better. He was here in February and was sent for an echo. This showed his EF to be just low normal. His follow up visit in March was unremarkable. Now he says that he has just continued to do poorly. He is more short of breath. For the past few nights, he has had PND. He has no cough. No swelling. Weights have been fairly stable. No hemoptysis. No recent travel. No chest pain. No pain in his lower legs. Blood sugars are up and down. [place] [facility] [place] him longer. He does not have any Lasix on hand.   Current Outpatient Prescriptions on File Prior to Visit  Medication Sig Dispense Refill  . allopurinol (ZYLOPRIM) 100 MG tablet Take 200 mg by mouth daily.       Marland Kitchen aspirin 81 MG EC tablet Take 81 mg by mouth daily.        . clopidogrel (PLAVIX) 75 MG tablet Take 75 mg by mouth daily.        . colchicine 0.6 MG tablet Take 0.6 mg by mouth as needed.        . diclofenac (VOLTAREN) 75 MG EC tablet Take 75 mg by mouth daily.        . hydrochlorothiazide 25 MG tablet Take 12.5 mg by mouth daily.       . insulin aspart (NOVOLOG) 100 UNIT/ML injection Inject into the skin as  directed.        . labetalol (NORMODYNE) 200 MG tablet Take 200 mg by mouth 2 (two) times daily.        . Multiple Vitamin (MULTIVITAMIN) tablet Take 1 tablet by mouth daily.        Marland Kitchen OMEPRAZOLE PO Take 1 capsule by mouth daily.        . simvastatin (ZOCOR) 20 MG tablet Take 10 mg by mouth at bedtime.          No Known Allergies  Past Medical History  Diagnosis Date  . Hypertension   . Diabetes mellitus   . Ischemic heart disease     dates back to 1997 --   . Myocardial infarction     had CABG x3 -- left internal mammary to the LAD, vein graft to the posterior descending, and a vein graft to the posterior lateral branch  . Hyperkalemia   . Thrombocytopenia   . Shortness of breath   . Dyslipidemia   . Arthritis   . Gout   . CAD (coronary  artery disease)     Past Surgical History  Procedure Date  . Coronary artery bypass graft 1997     x3 -- left internal mammary to the LAD, vein graft to the posterior descending, and a vein graft to the posterior lateral branch  . Cardiac catheterization 2005    stenting of the vein graft to the posterior lateral per -- Dr. Martinique      . Cardiac catheterization 2011    showed right coronary, totally occluded LAD and severe stenosis in both saphenous vein graft to the posterior lateral and posterior descending  -- stenting of the proximal portion of the SVG to the posterior lateral branch in 2/11  . Appendectomy   . Cholecystectomy     History  Smoking status  . Former Smoker  . Quit date: 10/11/1986  Smokeless tobacco  . Not on file    History  Alcohol Use No    Family History  Problem Relation Age of Onset  . Coronary artery disease Father   . Heart attack Father   . Coronary artery disease Brother     1/2 brother   . Kidney disease Sister   . Cancer Cousin   . Cardiomyopathy Mother     Review of Systems: The review of systems is per the HPI.  All other systems were reviewed and are negative.  Physical Exam: BP 154/80   Pulse 62  Ht 6' (1.829 m)  Wt 217 lb (98.431 kg)  BMI 29.43 kg/m2  SpO2 95% Patient is very pleasant and in no acute distress. Skin is warm and dry. Color is normal.  HEENT is unremarkable. Normocephalic/atraumatic. PERRL. Sclera are nonicteric. Neck is supple. No masses. No JVD. Lungs are clear. Cardiac exam shows a regular rate and rhythm. Occasional ectopic noted.  Abdomen is soft. Extremities are without edema. Gait and ROM are intact. No gross neurologic deficits noted.  LABORATORY DATA: EKG today shows sinus with ST and T wave changes inferolaterally. He has a prolonged QT. This tracing is unchanged from past EKGs.   Lab Results  Component Value Date   WBC 6.1 11/26/2009   HGB 13.8 11/26/2009   HCT 40.3 11/26/2009   PLT 94* 11/26/2009   GLUCOSE 156* 11/26/2009   NA 138 11/26/2009   K 4.7 11/26/2009   CL 104 11/26/2009   CREATININE 1.30 11/26/2009   BUN 22 11/26/2009   CO2 26 11/26/2009     Assessment / Plan:

## 2012-01-18 NOTE — Telephone Encounter (Signed)
New Problem:     Patient called in wanting to be seen today because he has had trouble breathing and hasn't been sleeping for the past two days.

## 2012-01-18 NOTE — Telephone Encounter (Signed)
Patient called stated for the past 3 to 4 nights wakes up gasping for breath.Appointment scheduled with Truitt Merle NP today 01/18/12 at 3:15 pm

## 2012-01-18 NOTE — Assessment & Plan Note (Signed)
We will see what his BNP comes back at. EF was low normal on recent echo. I suspect other etiologies for his DOE.

## 2012-01-18 NOTE — Patient Instructions (Signed)
We are going to start you on Lasix 20 mg daily  We are going to check labs today  I want you to go and get a CXR at [facility] on the 1st floor tomorrow morning  I am going to arrange for a CT scan of your chest tomorrow here at this office tomorrow  We need to put you on some oxygen - wear especially at night  We may end up sending you to a lung doctor

## 2012-01-19 ENCOUNTER — Other Ambulatory Visit: Payer: Self-pay

## 2012-01-19 ENCOUNTER — Inpatient Hospital Stay: Admission: RE | Admit: 2012-01-19 | Payer: Medicare Other | Source: Ambulatory Visit

## 2012-01-19 ENCOUNTER — Encounter (HOSPITAL_COMMUNITY): Payer: Self-pay

## 2012-01-19 ENCOUNTER — Other Ambulatory Visit: Payer: Self-pay | Admitting: Nurse Practitioner

## 2012-01-19 ENCOUNTER — Encounter (HOSPITAL_COMMUNITY)
Admission: RE | Admit: 2012-01-19 | Discharge: 2012-01-19 | Disposition: A | Discharge status: Home / Self Care | Payer: Medicare Other | Source: Ambulatory Visit | Attending: Nurse Practitioner | Admitting: Nurse Practitioner

## 2012-01-19 DIAGNOSIS — R0902 Hypoxemia: Secondary | ICD-10-CM

## 2012-01-19 DIAGNOSIS — R0602 Shortness of breath: Secondary | ICD-10-CM

## 2012-01-19 DIAGNOSIS — R0600 Dyspnea, unspecified: Secondary | ICD-10-CM

## 2012-01-19 MED ORDER — TECHNETIUM TO 99M ALBUMIN AGGREGATED
3.3000 | Freq: Once | INTRAVENOUS | Status: AC | PRN
  Administered 2012-01-19: 3.3 via INTRAVENOUS

## 2012-02-08 ENCOUNTER — Ambulatory Visit (INDEPENDENT_AMBULATORY_CARE_PROVIDER_SITE_OTHER): Payer: Medicare Other | Admitting: Internal Medicine

## 2012-02-08 ENCOUNTER — Encounter: Payer: Self-pay | Admitting: Internal Medicine

## 2012-02-08 VITALS — BP 126/64 | HR 95 | Temp 97.6°F | Ht 72.0 in | Wt 215.4 lb

## 2012-02-08 DIAGNOSIS — R0600 Dyspnea, unspecified: Secondary | ICD-10-CM

## 2012-02-08 DIAGNOSIS — R0609 Other forms of dyspnea: Secondary | ICD-10-CM

## 2012-02-08 DIAGNOSIS — R0989 Other specified symptoms and signs involving the circulatory and respiratory systems: Secondary | ICD-10-CM

## 2012-02-08 NOTE — Progress Notes (Signed)
  Subjective:    Patient ID: Scott [name], male    DOB: 01-12-1933,   MRN: [id number]  HPI  40 yowm quit smoking 1990 no trouble until Flu Dec 2012 and sob ever since so w/u by dr Martinique who referred to pulmonary clinic 02/08/2012   02/08/2012 1st pulmonary cc some better p rx by Dr Martinique (night time spells resolved p doubled lasix dose) but doe x swimming x 15 laps baseline  vs 10 now and fast walking across parking assoc with hoarseness and some sense of excessive post nasal drainge daytime cough but minimally productive with no discolored or bloody mucus. No obvious daytime variabilty or assoc  r chest tightness, subjective wheeze overt   hb symptoms. No unusual exp hx or h/o childhood pna/ asthma or premature birth to his knowledge.   Sleeping ok without nocturnal  or early am exacerbation  of respiratory  c/o's or need for noct saba. Also denies any obvious fluctuation of symptoms with weather or environmental changes or other aggravating or alleviating factors except as outlined above     Review of Systems  Constitutional: Negative for fever, chills, activity change, appetite change and unexpected weight change.  HENT: Negative for congestion, sore throat, rhinorrhea, sneezing, trouble swallowing, dental problem, voice change and postnasal drip.   Eyes: Negative for visual disturbance.  Respiratory: Positive for cough and shortness of breath. Negative for choking.   Cardiovascular: Negative for chest pain and leg swelling.  Gastrointestinal: Negative for nausea, vomiting and abdominal pain.  Genitourinary: Negative for difficulty urinating.  Musculoskeletal: Positive for arthralgias.  Skin: Negative for rash.  Psychiatric/Behavioral: Negative for behavioral problems and confusion.       Objective:   Physical Exam Very hoarse pot bellied amb wm nad/ upper dentures  Wt 215 02/08/2012  Wt Readings from Last 3 Encounters:  02/08/12 215 lb 6.4 oz (97.705 kg)  01/18/12 217 lb (98.431  kg)  12/10/11 215 lb (97.523 kg)    HEENT mild turbinate edema.  Oropharynx no thrush or excess pnd or cobblestoning.  No JVD or cervical adenopathy. Mild accessory muscle hypertrophy. Trachea midline, nl thryroid. Chest was hyperinflated by percussion with diminished breath sounds and moderate increased exp time without wheeze. Hoover sign positive at mid inspiration. Regular rate and rhythm without murmur gallop or rub or increase P2 or edema.  Abd: no hsm, nl excursion. Ext warm without cyanosis or clubbing.     01/18/12 CXR  Cardiomegaly with progressive interstitial prominence.  Possibilities include pulmonary venous congestion without overt  congestive failure or developing interstitial lung disease  V/Q same day low prob     Assessment & Plan:

## 2012-02-08 NOTE — Patient Instructions (Signed)
Try prilosec 20mg   Take 30-60 min before first meal of the day and Pepcid 20 mg one bedtime until you return  GERD (REFLUX)  is an extremely common cause of respiratory symptoms( like hoarsenss and short of breath), many times with no significant heartburn at all.    It can be treated with medication, but also with lifestyle changes including avoidance of late meals, excessive alcohol, smoking cessation, and avoid fatty foods, chocolate, peppermint, colas, red wine, and acidic juices such as orange juice.  NO MINT OR MENTHOL PRODUCTS SO NO COUGH DROPS  USE SUGARLESS CANDY INSTEAD (jolley ranchers or Stover's)  NO OIL BASED VITAMINS - use powdered substitutes.  Please schedule a follow up office visit in 4 weeks, sooner if needed with PFT's

## 2012-03-01 ENCOUNTER — Telehealth: Payer: Self-pay | Admitting: Nurse Practitioner

## 2012-03-01 NOTE — Telephone Encounter (Signed)
error 

## 2012-03-03 NOTE — Assessment & Plan Note (Signed)
Not able to reproduce this problem in the office but he does have features that suggest  Classic Upper airway cough syndrome, so named because it's frequently impossible to sort out how much is  CR/sinusitis with freq throat clearing (which can be related to primary GERD)   vs  causing  secondary (" extra esophageal")  GERD from wide swings in gastric pressure that occur with throat clearing, often  promoting self use of mint and menthol lozenges that reduce the lower esophageal sphincter tone and exacerbate the problem further in a cyclical fashion.   These are the same pts (now being labeled as having "irritable larynx syndrome" by some cough centers) who not infrequently have a history of having failed to tolerate ace inhibitors,  dry powder inhalers or biphosphonates or report having atypical reflux symptoms that don't respond to standard doses of PPI , and are easily confused as having aecopd or asthma flares by even experienced allergists/ pulmonologists.   For now max rx for GERD then return to office in 4 weeks to regroup.

## 2012-03-08 ENCOUNTER — Encounter: Payer: Self-pay | Admitting: Internal Medicine

## 2012-03-08 ENCOUNTER — Ambulatory Visit (INDEPENDENT_AMBULATORY_CARE_PROVIDER_SITE_OTHER): Payer: Medicare Other | Admitting: Internal Medicine

## 2012-03-08 VITALS — BP 132/66 | HR 60 | Temp 97.7°F | Ht 72.0 in | Wt 214.0 lb

## 2012-03-08 DIAGNOSIS — R0989 Other specified symptoms and signs involving the circulatory and respiratory systems: Secondary | ICD-10-CM

## 2012-03-08 DIAGNOSIS — R0609 Other forms of dyspnea: Secondary | ICD-10-CM

## 2012-03-08 DIAGNOSIS — R0600 Dyspnea, unspecified: Secondary | ICD-10-CM

## 2012-03-08 LAB — PULMONARY FUNCTION TEST

## 2012-03-08 NOTE — Progress Notes (Signed)
PFT done today. 

## 2012-03-08 NOTE — Assessment & Plan Note (Signed)
I had an extended summary discussion with the patient today lasting 15 to 20 minutes of a 25 minute visit on the following issues:  PFT's reviewed and suggest obesity, not air flow obstruction, so he doesn't have significant copd and very unlilkely he ever will  ERV is disproportionate and reflects effect of abd on diaphragm fx and lung volumes  a) needs to work on wt loss as a priority.  b)  May place him at risk of gerd so careful tapering off gerd rx, emphasized diet.

## 2012-03-08 NOTE — Patient Instructions (Addendum)
Your lung functions do not show significant lung damage from smoking but do show the effect of your abdomen on your diaphragm  Try stop  prilosec first for a few weeks to see if any worse respiratory symptoms and if not then ok try off pepcid, too  GERD (REFLUX)  is an extremely common cause of respiratory symptoms, many times with no significant heartburn at all.    It can be treated with medication, but also with lifestyle changes including avoidance of late meals, excessive alcohol, smoking cessation, and avoid fatty foods, chocolate, peppermint, colas, red wine, and acidic juices such as orange juice.  NO MINT OR MENTHOL PRODUCTS SO NO COUGH DROPS  USE SUGARLESS CANDY INSTEAD (jolley ranchers or Stover's)  NO OIL BASED VITAMINS - use powdered substitutes.    Follow up as needed.

## 2012-03-08 NOTE — Progress Notes (Signed)
  Subjective:    Patient ID: Scott [name], male    DOB: 03-18-1933,   MRN: [id number]  HPI  32 yowm quit smoking 1990 no trouble until Flu Dec 2012 and sob ever since so w/u by dr Scott [name] who referred to pulmonary clinic 02/08/2012 with PFT's no significant obstruction 03/08/12  02/08/2012 1st pulmonary cc some better p rx by Dr Scott [name] (night time spells resolved p doubled lasix dose) but doe x swimming x 15 laps baseline  vs 10 now and fast walking across parking assoc with hoarseness and some sense of excessive post nasal drainge daytime cough but minimally productive.  No obvious daytime variabilty or assoc chest tightness, subjective wheeze overt   hb symptoms. No unusual exp hx or h/o childhood pna/ asthma or premature birth to his knowledge.  rec Try prilosec 20mg   Take 30-60 min before first meal of the day and Pepcid 20 mg one bedtime until you return GERDdiet Please schedule a follow up office visit in 4 weeks, sooner if needed with PFT's.  03/08/2012 f/u ov/Scott [name] cc breathing much better to point where can do 15 laps of swimming and all previous activities s limiting sob, no cough, no need for daytime saba, no over hb or reflux symptoms.  Sleeping ok without nocturnal  or early am exacerbation  of respiratory  c/o's or need for noct saba. Also denies any obvious fluctuation of symptoms with weather or environmental changes or other aggravating or alleviating factors except as outlined above.  ROS  At present neg for  any significant sore throat, dysphagia, dental problems, itching, sneezing,  nasal congestion or excess/ purulent secretions, ear ache,   fever, chills, sweats, unintended wt loss, pleuritic or exertional cp, hemoptysis, palpitations, orthopnea pnd or leg swelling.  Also denies presyncope, palpitations, heartburn, abdominal pain, anorexia, nausea, vomiting, diarrhea  or change in bowel or urinary habits, change in stools or urine, dysuria,hematuria,  rash, arthralgias, visual  complaints, headache, numbness weakness or ataxia or problems with walking or coordination. No noted change in mood/affect or memory.              Objective:   Physical Exam Minimally   hoarse pot bellied amb wm nad/ upper dentures  Wt  214 03/08/2012  Wt Readings from Last 3 Encounters:  02/08/12 215 lb 6.4 oz (97.705 kg)  01/18/12 217 lb (98.431 kg)  12/10/11 215 lb (97.523 kg)    HEENT mild turbinate edema.  Oropharynx no thrush or excess pnd or cobblestoning.  No JVD or cervical adenopathy. No sign accessory muscle hypertrophy. Trachea midline, nl thryroid. Chest was not significantly hyperinflated by percussion with ok  breath sounds and no increased exp time without wheeze. Hoover sign positive at end inspiration. Regular rate and rhythm without murmur gallop or rub or increase P2 or edema.  Abd: pot bellied, no hsm, nl excursion. Ext warm without cyanosis or clubbing.     01/18/12 CXR  Cardiomegaly with progressive interstitial prominence.  Possibilities include pulmonary venous congestion without overt  congestive failure or developing interstitial lung disease  V/Q same day low prob     Assessment & Plan:

## 2012-03-14 ENCOUNTER — Telehealth: Payer: Self-pay | Admitting: Cardiology

## 2012-06-02 ENCOUNTER — Telehealth: Payer: Self-pay | Admitting: Cardiology

## 2012-06-02 DIAGNOSIS — I2510 Atherosclerotic heart disease of native coronary artery without angina pectoris: Secondary | ICD-10-CM

## 2012-06-02 NOTE — Telephone Encounter (Signed)
Patient called was told spoke to [place] re quarding artery in rt neck bulges out more than the lf side,no pain or discomfort.Advised if gets worse or becomes painful to call back.

## 2012-06-02 NOTE — Telephone Encounter (Signed)
Patient called stated he is still sob.States he feels bad,no energy.States he saw Dr.Wert and was told his lungs are ok.States he does not sleep well at night and thought he might need a sleep study test done.Spoke with Dr.Jordan he advised ok to do sleep study and needs appointment after sleep study.

## 2012-06-02 NOTE — Telephone Encounter (Signed)
Pt c/o SOB, lightheaded, artery in neck seems to be bulging over the last couple of day.  Please return call to patient at hm#

## 2012-06-02 NOTE — Telephone Encounter (Signed)
Pt has another question, pls call [phone]

## 2012-06-21 ENCOUNTER — Ambulatory Visit (HOSPITAL_BASED_OUTPATIENT_CLINIC_OR_DEPARTMENT_OTHER): Payer: Medicare Other | Attending: Cardiology | Admitting: Radiology

## 2012-06-21 VITALS — Ht 72.0 in | Wt 215.0 lb

## 2012-06-21 DIAGNOSIS — G4733 Obstructive sleep apnea (adult) (pediatric): Secondary | ICD-10-CM | POA: Insufficient documentation

## 2012-06-21 DIAGNOSIS — R259 Unspecified abnormal involuntary movements: Secondary | ICD-10-CM | POA: Insufficient documentation

## 2012-06-21 DIAGNOSIS — I4949 Other premature depolarization: Secondary | ICD-10-CM | POA: Insufficient documentation

## 2012-06-21 DIAGNOSIS — I2510 Atherosclerotic heart disease of native coronary artery without angina pectoris: Secondary | ICD-10-CM

## 2012-06-29 ENCOUNTER — Other Ambulatory Visit: Payer: Self-pay

## 2012-06-29 ENCOUNTER — Telehealth: Payer: Self-pay | Admitting: Pulmonary Disease

## 2012-06-29 DIAGNOSIS — G4730 Sleep apnea, unspecified: Secondary | ICD-10-CM

## 2012-06-29 DIAGNOSIS — G4710 Hypersomnia, unspecified: Secondary | ICD-10-CM

## 2012-06-29 NOTE — Telephone Encounter (Signed)
I spoke with Scott [name] at cardiology and she will have Dr. Martinique call [facility] to discuss sleep test results. Pt is scheduled for consult on 07/27/12 but they would like this sooner. The only consult slot that is available is 07/24/12. Dr. Gwenette Greet, pls advise. Your first 15 min, non consult slot, is on 20/9/13. I have used most of your hold slots already.

## 2012-06-30 ENCOUNTER — Telehealth: Payer: Self-pay

## 2012-06-30 NOTE — Telephone Encounter (Signed)
Spoke to patient 06/29/12 was told received message from [place] sleep study revealed moderate sleep apnea.Appointment scheduled with Dr.Clance 07/27/12.Patient complained of swelling in feet and leg,sob,dizzy.Advised to keep appointment with Dr.Jordan 07/04/12.

## 2012-06-30 NOTE — Procedures (Signed)
NAMETAIGE, DESANTIS NO.:  [id number]  MEDICAL RECORD NO.:  [id number]          PATIENT TYPE:  OUT  LOCATION:  SLEEP CENTER                 FACILITY:  [facility]  PHYSICIAN:  Kathee Delton, MD,FCCPDATE OF BIRTH:  Dec 21[birth year] 1934  DATE OF STUDY:                           NOCTURNAL POLYSOMNOGRAM  REFERRING PHYSICIAN:  Peter M. Martinique, M.D.  LOCATION:  Sleep apnea.  INDICATION FOR STUDY:  Hypersomnia with sleep apnea.  EPWORTH SLEEPINESS SCORE:  2.  MEDICATIONS:  SLEEP ARCHITECTURE:  The patient had total sleep time of 269 minutes with no slow-wave sleep or REM noted.  Sleep onset latency was normal at 7.5 minutes and sleep efficiency was poor at 64%.  RESPIRATORY DATA:  The patient was noted to have 63 apneas and 43 obstructive hypopneas, giving him an apnea-hypopnea index of 24 events per hour.  The events occurred in all body positions, and there was loud snoring noted throughout.  OXYGEN DATA:  The patient was found to have oxygen desaturation as low as 82% with his obstructive events.  At 0133 hours, the patient was placed on 1 L of oxygen per Sleep Center protocol.  CARDIAC DATA:  Frequent PVCs noted throughout, and an occasional episode of bigeminy.  MOVEMENT-PARASOMNIA:  The patient was found have 362 leg jerks with 6 per hour resulting in arousal or awakening.  IMPRESSIONS-RECOMMENDATIONS: 1. Moderate obstructive sleep apnea/hypopnea syndrome, with an AHI of     24 events per hour and oxygen desaturation as low as 82%.     Treatment for this degree of sleep apnea can include a trial of     weight loss alone, upper airway surgery, dental appliance, and also     CPAP.  Clinical correlation suggested. 2. Frequent PVCs noted throughout, as well as an occasional episode of     bigeminy. 3. Very large numbers of periodic limb movements, with significant     sleep disruption.  It is unclear whether the patient's limb     movements were associated with his  sleep-disordered breathing, or     whether he may have a     concomitant primary movement disorder of sleep.  Clinical     correlation is suggested after treatment of his sleep-disordered     breathing.     Kathee Delton, MD,FCCP [place], [place] Board of Sleep Medicine    [facility]/MEDQ  D:  06/29/2012 07:36:25  T:  06/30/2012 00:16:45  Job:  [id number]

## 2012-07-04 ENCOUNTER — Ambulatory Visit (INDEPENDENT_AMBULATORY_CARE_PROVIDER_SITE_OTHER): Payer: Medicare Other | Admitting: Cardiology

## 2012-07-04 ENCOUNTER — Encounter: Payer: Self-pay | Admitting: Cardiology

## 2012-07-04 VITALS — BP 138/76 | HR 61 | Resp 18 | Ht 72.0 in | Wt 229.8 lb

## 2012-07-04 DIAGNOSIS — I10 Essential (primary) hypertension: Secondary | ICD-10-CM

## 2012-07-04 DIAGNOSIS — R0600 Dyspnea, unspecified: Secondary | ICD-10-CM

## 2012-07-04 DIAGNOSIS — I509 Heart failure, unspecified: Secondary | ICD-10-CM

## 2012-07-04 DIAGNOSIS — R0609 Other forms of dyspnea: Secondary | ICD-10-CM

## 2012-07-04 DIAGNOSIS — I2510 Atherosclerotic heart disease of native coronary artery without angina pectoris: Secondary | ICD-10-CM

## 2012-07-04 DIAGNOSIS — R0989 Other specified symptoms and signs involving the circulatory and respiratory systems: Secondary | ICD-10-CM

## 2012-07-04 DIAGNOSIS — G4733 Obstructive sleep apnea (adult) (pediatric): Secondary | ICD-10-CM | POA: Insufficient documentation

## 2012-07-04 DIAGNOSIS — I5033 Acute on chronic diastolic (congestive) heart failure: Secondary | ICD-10-CM

## 2012-07-04 MED ORDER — FUROSEMIDE 40 MG PO TABS: 40.0000 mg | ORAL_TABLET | Freq: Two times a day (BID) | ORAL | Status: DC

## 2012-07-04 NOTE — Progress Notes (Signed)
Scott [name] Date of Birth: [birth year]   History of Present Illness: Scott [name] is seen for a followup visit. He has a history of coronary disease and is status post myocardial infarction 1997. He at coronary bypass surgery x3 at that time. This included an LIMA graft to LAD, saphenous vein graft to the PDA, saphenous vein graft to the posterior lateral branch. In October 2005 he had stenting of the vein graft to the posterior lateral branch. In 2011 he had stenting of the proximal portion of this vein graft to the posterior lateral branch. His more recent complaint has been of increasing dyspnea. In April he was started on Lasix 20 mg a day. He really noted no significant benefit. BNP level at that time was 988. Chest x-ray showed pulmonary vascular congestion. He has since had pulmonary evaluation with Scott [name]. Pulmonary function studies were consistent with restrictive lung disease without obstructive lung disease. A VQ scan was low probability. He has had a sleep study which demonstrated evidence of moderate obstructive sleep apnea. He is scheduled for evaluation with Scott [name] next month. Over the past 2 months he reports that his breathing has been particularly bad. He has noted a marked increase in swelling over the past 2 weeks. His weight is up 15 pounds. He complains of PND. He has dyspnea on exertion walking 30 yards.  Current Outpatient Prescriptions on File Prior to Visit  Medication Sig Dispense Refill  . allopurinol (ZYLOPRIM) 100 MG tablet Take 200 mg by mouth daily.       Marland Kitchen aspirin 81 MG EC tablet Take 81 mg by mouth daily.        . clopidogrel (PLAVIX) 75 MG tablet Take 75 mg by mouth daily.        . colchicine 0.6 MG tablet Take 0.6 mg by mouth as needed.        . diclofenac (VOLTAREN) 75 MG EC tablet Take 75 mg by mouth daily.        . famotidine (PEPCID) 20 MG tablet Take 20 mg by mouth at bedtime.      . insulin aspart (NOVOLOG) 100 UNIT/ML injection Inject into the skin as  directed.        . labetalol (NORMODYNE) 200 MG tablet Take 200 mg by mouth 2 (two) times daily.        . Multiple Vitamin (MULTIVITAMIN) tablet Take 1 tablet by mouth daily.        Marland Kitchen omeprazole (PRILOSEC) 20 MG capsule Take 20 mg by mouth daily before breakfast.      . simvastatin (ZOCOR) 20 MG tablet Take 10 mg by mouth at bedtime.        Marland Kitchen DISCONTD: furosemide (LASIX) 20 MG tablet Take 1 tablet (20 mg total) by mouth daily.  30 tablet  11    No Known Allergies  Past Medical History  Diagnosis Date  . Hypertension   . Diabetes mellitus   . Ischemic heart disease     dates back to 1997 --   . Myocardial infarction     had CABG x3 -- left internal mammary to the LAD, vein graft to the posterior descending, and a vein graft to the posterior lateral branch  . Hyperkalemia   . Thrombocytopenia   . Shortness of breath   . Dyslipidemia   . Arthritis   . Gout   . CAD (coronary artery disease)     Past Surgical History  Procedure Date  . Coronary artery  bypass graft 1997     x3 -- left internal mammary to the LAD, vein graft to the posterior descending, and a vein graft to the posterior lateral branch  . Cardiac catheterization 2005    stenting of the vein graft to the posterior lateral per -- Scott [name]      . Cardiac catheterization 2011    showed right coronary, totally occluded LAD and severe stenosis in both saphenous vein graft to the posterior lateral and posterior descending  -- stenting of the proximal portion of the SVG to the posterior lateral branch in 2/11  . Appendectomy   . Cholecystectomy     History  Smoking status  . Former Smoker -- 1.5 packs/day for 35 years  . Types: Cigarettes  . Quit date: 10/12/1983  Smokeless tobacco  . Never Used    History  Alcohol Use  . Yes    social only    Family History  Problem Relation Age of Onset  . Coronary artery disease Father   . Heart attack Father   . Coronary artery disease Brother     1/2 brother   .  Kidney disease Sister   . Cardiomyopathy Mother   . Asthma Brother     Review of Systems: As noted in history of present illness All other systems were reviewed and are negative.  Physical Exam: BP 138/76  Pulse 61  Resp 18  Ht 6' (1.829 m)  Wt 229 lb 12.8 oz (104.237 kg)  BMI 31.17 kg/m2  SpO2 96% He is an overweight white male in no acute distress. He is normocephalic, atraumatic. Pupils are equal round and reactive to light and accommodation. Sclera are clear. Oropharynx is clear. Neck is supple with JVD to the angle of the jaw. There is no adenopathy, thyromegaly, or bruits. Lungs are clear. Cardiac exam reveals a regular rate and rhythm without gallop, murmur, or click. Abdomen is obese, distended, and nontender. He has no masses or bruits. Femoral and pedal pulses are 2+ and symmetric. He has 2+  edema. He is alert and oriented x3. Cranial nerves II through XII are intact.  LABORATORY DATA: Prior Echocardiogram demonstrated low-normal left ventricular function with ejection fraction of 50%. This is unchanged. He had moderate to severe left atrial enlargement with moderate pulmonary hypertension. Estimated RV systolic pressure was 60 mm of mercury.  Assessment / Plan: 1. Acute on chronic diastolic CHF. Patient has significant volume overloaded today. This is evidenced by increased JVD, edema, and weight gain. I recommended increasing his Lasix to 40 mg twice a day. We will continue with sodium restriction. He will continue his other medications. I will have him return in 2 weeks for followup and we will check a basic metabolic panel and BNP level at that time. He does have a history of some chronic kidney disease. He has not had a previous right heart catheterization. His last left heart catheterization did show a normal LV EDP at the beginning of the procedure but after dye load EDP increased to 32 mmHg which is consistent with diastolic dysfunction. I think he has at least 15 pounds of  volume on board, maybe more.  2. Obesity with obstructive sleep apnea. He will follow through with Scott [name]. For now will continue with nocturnal oxygen.  3. Coronary disease status post CABG and prior coronary interventions as noted above.  4. Hypertension.  5. Diabetes mellitus on insulin.  6. Dyslipidemia.

## 2012-07-04 NOTE — Telephone Encounter (Signed)
Katrinka Blazing with cardiology aware pf [place] recs for appt. She will let the pt know that we will get him in sooner if possible. I will hold the msg in my box as a reminder.

## 2012-07-04 NOTE — Telephone Encounter (Signed)
I looked at his sleep study, and I think the 14th should be adequate.  However, I would like to keep him in mind so that as people cancel or reschedule, we can get him in earlier.  Let pt know we will get him in as soon as possible.

## 2012-07-04 NOTE — Patient Instructions (Addendum)
Increase furosemide to 40 mg twice a day.  Restrict your sodium intake.  I will have you return in 2 weeks with lab work.

## 2012-07-05 NOTE — Telephone Encounter (Signed)
Pt aware of earlier appt for sleep consult on Mon., 07/10/12 and will arrive at 2:15. Pt verbalized understanding.

## 2012-07-05 NOTE — Telephone Encounter (Signed)
LMOMTCB x 1 for pt to see if the pt can come for an earlier appt/sleep consult on Mon., 07/10/12 with [place] at 2:30. He would need to arrive at 0:15 I have held this slot for him.Marland Kitchen

## 2012-07-10 ENCOUNTER — Encounter: Payer: Self-pay | Admitting: Pulmonary Disease

## 2012-07-10 ENCOUNTER — Ambulatory Visit (INDEPENDENT_AMBULATORY_CARE_PROVIDER_SITE_OTHER): Payer: Medicare Other | Admitting: Pulmonary Disease

## 2012-07-10 VITALS — BP 122/62 | HR 64 | Temp 97.8°F | Ht 72.0 in | Wt 222.0 lb

## 2012-07-10 DIAGNOSIS — G4733 Obstructive sleep apnea (adult) (pediatric): Secondary | ICD-10-CM

## 2012-07-10 NOTE — Patient Instructions (Addendum)
Will start on cpap.  Please call if having issues with tolerance. Work on weight reduction. followup with me in 6 weeks.

## 2012-07-10 NOTE — Progress Notes (Signed)
  Subjective:    Patient ID: Scott [name], male    DOB: 11/14/1932, 78 y.o.   MRN: [id number]  HPI The patient is a 78 year old male who I've been asked to see for management of obstructive sleep apnea.  He recently underwent a sleep study, where he was found to have moderate disease with an AHI of 24 events per hour.  The patient has underlying cardiovascular disease as well as diabetes, and from his description clearly has an impact to his quality of life.  He has been told that he has loud snoring, and as well as an abnormal breathing pattern during sleep.  He has frequent awakenings, and is not rested in the mornings upon arising.  He notes significant sleep pressured during the day with periods of inactivity, and can fall sleep very easily with reading the newspaper at times.  He also notes some sleepiness with driving.  The patient states that his weight is up about 5-8 pounds over the last 2 years, and his Epworth sleepiness score today is 12.  Sleep Questionnaire: What time do you typically go to bed?( Between what hours) 10-11pm How long does it take you to fall asleep? 5-70mins How many times during the night do you wake up? 7 What time do you get out of bed to start your day? 0700 Do you drive or operate heavy machinery in your occupation? No How much has your weight changed (up or down) over the past two years? (In pounds) 8 lb (3.629 kg) Have you ever had a sleep study before? Yes If yes, location of study? [place] If yes, date of study? 06/21/2012 Do you currently use CPAP? No Do you wear oxygen at any time? Yes O2 Flow Rate (L/min) 3 L/min     Review of Systems  Constitutional: Negative for fever and unexpected weight change.  HENT: Negative for ear pain, nosebleeds, congestion, sore throat, rhinorrhea, sneezing, trouble swallowing, dental problem, postnasal drip and sinus pressure.   Eyes: Negative for redness and itching.  Respiratory: Positive for shortness of breath. Negative  for cough, chest tightness and wheezing.   Cardiovascular: Positive for palpitations and leg swelling.  Gastrointestinal: Negative for nausea and vomiting.  Genitourinary: Negative for dysuria.  Musculoskeletal: Negative for joint swelling.  Skin: Negative for rash.  Neurological: Negative for headaches.  Hematological: Does not bruise/bleed easily.  Psychiatric/Behavioral: Negative for dysphoric mood. The patient is not nervous/anxious.        Objective:   Physical Exam Constitutional:  Overweight male, no acute distress  HENT:  Nares patent without discharge, but deviation to left with mild narrowing.  Oropharynx without exudate, palate and uvula are thick and elongated.   Eyes:  Perrla, eomi, no scleral icterus  Neck:  No JVD, no TMG  Cardiovascular:  Normal rate, regular rhythm, no rubs or gallops.  No murmurs        Intact distal pulses  Pulmonary :  Normal breath sounds, no stridor or respiratory distress   No rales, rhonchi, or wheezing  Abdominal:  Soft, nondistended, bowel sounds present.  No tenderness noted.   Musculoskeletal:  1-2+ lower extremity edema noted.  Lymph Nodes:  No cervical lymphadenopathy noted  Skin:  No cyanosis noted  Neurologic:  Appears sleepy, appropriate, moves all 4 extremities without obvious deficit.         Assessment & Plan:

## 2012-07-10 NOTE — Assessment & Plan Note (Signed)
The patient has been diagnosed with moderate obstructive sleep apnea on a recent study, and is clearly having an impact on his quality of life by his description.  He also has underlying cardiac issues that can be affected by untreated sleep apnea.  I have reviewed the pathophysiology of sleep apnea with him, including the various treatment options available for moderate disease.  I think CPAP coupled with weight loss would be his best treatment, and he is agreeable to try. I will set the patient up on cpap at a moderate pressure level to allow for desensitization, and will troubleshoot the device over the next 4-6weeks if needed.  The pt is to call me if having issues with tolerance.  Will then optimize the pressure once patient is able to wear cpap on a consistent basis.

## 2012-07-19 ENCOUNTER — Encounter: Payer: Self-pay | Admitting: Nurse Practitioner

## 2012-07-19 ENCOUNTER — Ambulatory Visit (INDEPENDENT_AMBULATORY_CARE_PROVIDER_SITE_OTHER): Payer: Medicare Other | Admitting: Nurse Practitioner

## 2012-07-19 ENCOUNTER — Ambulatory Visit (INDEPENDENT_AMBULATORY_CARE_PROVIDER_SITE_OTHER): Payer: Medicare Other | Admitting: *Deleted

## 2012-07-19 VITALS — BP 112/58 | HR 54 | Ht 72.0 in | Wt 221.0 lb

## 2012-07-19 DIAGNOSIS — I2510 Atherosclerotic heart disease of native coronary artery without angina pectoris: Secondary | ICD-10-CM

## 2012-07-19 DIAGNOSIS — I509 Heart failure, unspecified: Secondary | ICD-10-CM

## 2012-07-19 DIAGNOSIS — R0600 Dyspnea, unspecified: Secondary | ICD-10-CM

## 2012-07-19 DIAGNOSIS — R0602 Shortness of breath: Secondary | ICD-10-CM

## 2012-07-19 DIAGNOSIS — I10 Essential (primary) hypertension: Secondary | ICD-10-CM

## 2012-07-19 DIAGNOSIS — R0609 Other forms of dyspnea: Secondary | ICD-10-CM

## 2012-07-19 DIAGNOSIS — I259 Chronic ischemic heart disease, unspecified: Secondary | ICD-10-CM

## 2012-07-19 DIAGNOSIS — G4733 Obstructive sleep apnea (adult) (pediatric): Secondary | ICD-10-CM

## 2012-07-19 DIAGNOSIS — I219 Acute myocardial infarction, unspecified: Secondary | ICD-10-CM

## 2012-07-19 DIAGNOSIS — I5033 Acute on chronic diastolic (congestive) heart failure: Secondary | ICD-10-CM

## 2012-07-19 LAB — BASIC METABOLIC PANEL
BUN: 29 mg/dL — ABNORMAL HIGH (ref 6–23)
CO2: 31 mEq/L (ref 19–32)
Calcium: 8.9 mg/dL (ref 8.4–10.5)
Chloride: 100 mEq/L (ref 96–112)
Creatinine, Ser: 1.4 mg/dL (ref 0.4–1.5)
GFR: 53.48 mL/min — ABNORMAL LOW (ref >60.00)
Glucose, Bld: 204 mg/dL — ABNORMAL HIGH (ref 70–99)
Potassium: 4.6 mEq/L (ref 3.5–5.1)
Sodium: 138 mEq/L (ref 135–145)

## 2012-07-19 LAB — BRAIN NATRIURETIC PEPTIDE: Pro B Natriuretic peptide (BNP): 1116 pg/mL — ABNORMAL HIGH (ref 0.0–100.0)

## 2012-07-19 MED ORDER — CLOPIDOGREL BISULFATE 75 MG PO TABS: 75.0000 mg | ORAL_TABLET | Freq: Every day | ORAL | Status: DC

## 2012-07-19 NOTE — Progress Notes (Signed)
Scott [name] Date of Birth: [birth year] Medical Record [id number]  History of Present Illness: Scott [name] is seen back today for a 2 week check. He is seen for Dr. Martinique. He has known CAD and is s/p MI in 1997 with CABG x 3 at that time. This included a LIMA to LAD< SVG to PDA, SVG to PL. In October of 2005, he had stenting of the SVG to the PL branch. He has had ongoing issues with dyspnea. PFT's showed a restrictive lung disease pattern. Sleep study has showed moderate OSA. His other issues include DM, HTN, pulmonary HTN, arthritis and gout. Dr. Martinique saw him 2 weeks ago. He had volume overload. Lasix was increased.   He comes back today. He is here alone. He notes that his breathing is not much better but his swelling is improving. No longer with swelling in his upper legs. Still with some in the lower extremities. His weight is down 8 pounds with the extra Lasix. No chest pain. Has felt a little dizzy. He will start a CPAP trial tonight per Dr. Gwenette Greet. He is tolerating his medicines. He does admit that he eats out most meals and probably gets too much salt. He lives alone and says it is just easier to go out.   Current Outpatient Prescriptions on File Prior to Visit  Medication Sig Dispense Refill  . allopurinol (ZYLOPRIM) 100 MG tablet Take 200 mg by mouth daily.       Marland Kitchen aspirin 81 MG EC tablet Take 81 mg by mouth daily.        . colchicine 0.6 MG tablet Take 0.6 mg by mouth as needed.        . diclofenac (VOLTAREN) 75 MG EC tablet Take 75 mg by mouth daily.        . famotidine (PEPCID) 20 MG tablet Take 20 mg by mouth at bedtime.      . furosemide (LASIX) 40 MG tablet Take 1 tablet (40 mg total) by mouth 2 (two) times daily.  180 tablet  3  . insulin aspart (NOVOLOG) 100 UNIT/ML injection Inject into the skin as directed.        . labetalol (NORMODYNE) 200 MG tablet Take 200 mg by mouth 2 (two) times daily.        . Multiple Vitamin (MULTIVITAMIN) tablet Take 1 tablet by mouth daily.        Marland Kitchen  omeprazole (PRILOSEC) 20 MG capsule Take 20 mg by mouth daily before breakfast.      . simvastatin (ZOCOR) 20 MG tablet Take 10 mg by mouth at bedtime.        Marland Kitchen DISCONTD: clopidogrel (PLAVIX) 75 MG tablet Take 75 mg by mouth daily.          No Known Allergies  Past Medical History  Diagnosis Date  . Hypertension   . Diabetes mellitus   . Ischemic heart disease     dates back to 1997 --   . Myocardial infarction     had CABG x3 -- left internal mammary to the LAD, vein graft to the posterior descending, and a vein graft to the posterior lateral branch  . Hyperkalemia   . Thrombocytopenia   . Shortness of breath   . Dyslipidemia   . Arthritis   . Gout   . CAD (coronary artery disease)   . Seasonal allergies   . Sinus trouble   . Sleep apnea     Past Surgical History  Procedure Date  .  Coronary artery bypass graft 1997     x3 -- left internal mammary to the LAD, vein graft to the posterior descending, and a vein graft to the posterior lateral branch  . Cardiac catheterization 2005    stenting of the vein graft to the posterior lateral per -- Dr. Martinique      . Cardiac catheterization 2011    showed right coronary, totally occluded LAD and severe stenosis in both saphenous vein graft to the posterior lateral and posterior descending  -- stenting of the proximal portion of the SVG to the posterior lateral branch in 2/11  . Appendectomy   . Cholecystectomy   . Back surgery     15 years ago  . Colon surgery     cancerous polyps    History  Smoking status  . Former Smoker -- 1.5 packs/day for 35 years  . Types: Cigarettes  . Quit date: 10/12/1983  Smokeless tobacco  . Never Used    History  Alcohol Use  . Yes    social only    Family History  Problem Relation Age of Onset  . Coronary artery disease Father   . Heart attack Father   . Coronary artery disease Brother     1/2 brother   . Kidney disease Sister   . Cardiomyopathy Mother   . Asthma Brother      Review of Systems: The review of systems is per the HPI.  All other systems were reviewed and are negative.  Physical Exam: BP 112/58  Pulse 54  Ht 6' (1.829 m)  Wt 221 lb (100.245 kg)  BMI 29.97 kg/m2  SpO2 95% Patient is very pleasant and in no acute distress. Skin is warm and dry. Color is normal.  HEENT is unremarkable. Normocephalic/atraumatic. PERRL. Sclera are nonicteric. Neck is supple. No masses. No JVD. Lungs are clear. Cardiac exam shows a regular rate and rhythm. Occasional ectopic noted.  Abdomen is soft. Extremities are with 1+ edema. Gait and ROM are intact. No gross neurologic deficits noted.  LABORATORY DATA: BNP & BMET PENDING FOR TODAY  Lab Results  Component Value Date   WBC 6.5 01/18/2012   HGB 13.1 01/18/2012   HCT 40.8 01/18/2012   PLT 84.0* 01/18/2012   GLUCOSE 131* 01/18/2012   NA 139 01/18/2012   K 4.9 01/18/2012   CL 104 01/18/2012   CREATININE 1.6* 01/18/2012   BUN 33* 01/18/2012   CO2 26 01/18/2012   Echo Study Conclusions from February 2013  - Left ventricle: The cavity size was normal. Wall thickness was increased in a pattern of mild LVH. Systolic function was normal. The estimated ejection fraction was 50%, in the range of 50% to 55%. - Mitral valve: Calcified annulus. Mildly thickened leaflets . Mild regurgitation. - Left atrium: The atrium was moderately to severely dilated. - Right ventricle: The cavity size was mildly dilated. - Right atrium: The atrium was moderately to severely dilated. - Pulmonary arteries: PA peak pressure: 45mm Hg (S).  Assessment / Plan:  1. Diastolic heart failure - his weight is down. He still has fluid on board. Salt use is going to be an issue. He doesn't really cook and lives alone. I have left him on his current regimen. Will check labs today and see him back in 3 weeks.   2. CAD - no chest pain reported.  3. HTN - blood pressure looks good.  4. OSA - to start CPAP trial tonight per Dr. Gwenette Greet. I think this will  probably benefit him greatly.  I will see him back in about 3 weeks. Check labs today. Continue with current medicines. Patient is agreeable to this plan and will call if any problems develop in the interim.

## 2012-07-19 NOTE — Patient Instructions (Addendum)
Stay on your current medicines  I want to see you in 3 weeks  Try to cut back on the salt  Call the [place] office at [phone] if you have any questions, problems or concerns.

## 2012-07-25 ENCOUNTER — Telehealth: Payer: Self-pay | Admitting: Cardiology

## 2012-07-25 NOTE — Telephone Encounter (Signed)
Patient called no answer.LMTC. 

## 2012-07-25 NOTE — Telephone Encounter (Signed)
F/u  Returning call back to nurse Alger call.

## 2012-07-27 ENCOUNTER — Institutional Professional Consult (permissible substitution): Payer: Medicare Other | Admitting: Pulmonary Disease

## 2012-07-28 NOTE — Telephone Encounter (Signed)
Patient called 07/25/12 no answer.Left message on personal voice mail lab results.Advised to continue Lasix 40 mg twice a day.Advised to call back if weight increases or if he has any more swelling.

## 2012-08-06 ENCOUNTER — Encounter: Payer: Medicare Other | Admitting: Physician Assistant

## 2012-08-09 ENCOUNTER — Encounter: Payer: Self-pay | Admitting: Nurse Practitioner

## 2012-08-09 ENCOUNTER — Ambulatory Visit (INDEPENDENT_AMBULATORY_CARE_PROVIDER_SITE_OTHER): Payer: Medicare Other | Admitting: Nurse Practitioner

## 2012-08-09 VITALS — BP 138/66 | HR 60 | Ht 72.0 in | Wt 221.1 lb

## 2012-08-09 DIAGNOSIS — R509 Fever, unspecified: Secondary | ICD-10-CM

## 2012-08-09 DIAGNOSIS — I5032 Chronic diastolic (congestive) heart failure: Secondary | ICD-10-CM

## 2012-08-09 LAB — CBC WITH DIFFERENTIAL/PLATELET
Basophils Absolute: 0 10*3/uL (ref 0.0–0.1)
Basophils Relative: 0.3 % (ref 0.0–3.0)
Eosinophils Absolute: 0.2 10*3/uL (ref 0.0–0.7)
Eosinophils Relative: 2.4 % (ref 0.0–5.0)
HCT: 34.9 % — ABNORMAL LOW (ref 39.0–52.0)
Hemoglobin: 11 g/dL — ABNORMAL LOW (ref 13.0–17.0)
Lymphocytes Relative: 7.5 % — ABNORMAL LOW (ref 12.0–46.0)
Lymphs Abs: 0.5 10*3/uL — ABNORMAL LOW (ref 0.7–4.0)
MCHC: 31.6 g/dL (ref 30.0–36.0)
MCV: 94.2 fl (ref 78.0–100.0)
Monocytes Absolute: 1.2 10*3/uL — ABNORMAL HIGH (ref 0.1–1.0)
Monocytes Relative: 18 % — ABNORMAL HIGH (ref 3.0–12.0)
Neutro Abs: 4.6 10*3/uL (ref 1.4–7.7)
Neutrophils Relative %: 71.8 % (ref 43.0–77.0)
Platelets: 81 10*3/uL — ABNORMAL LOW (ref 150.0–400.0)
RBC: 3.71 Mil/uL — ABNORMAL LOW (ref 4.22–5.81)
RDW: 19.1 % — ABNORMAL HIGH (ref 11.5–14.6)
WBC: 6.4 10*3/uL (ref 4.5–10.5)

## 2012-08-09 LAB — BASIC METABOLIC PANEL
BUN: 53 mg/dL — ABNORMAL HIGH (ref 6–23)
CO2: 26 mEq/L (ref 19–32)
Calcium: 8.6 mg/dL (ref 8.4–10.5)
Chloride: 101 mEq/L (ref 96–112)
Creatinine, Ser: 2.1 mg/dL — ABNORMAL HIGH (ref 0.4–1.5)
GFR: 32.31 mL/min — ABNORMAL LOW (ref >60.00)
Glucose, Bld: 241 mg/dL — ABNORMAL HIGH (ref 70–99)
Potassium: 5.4 mEq/L — ABNORMAL HIGH (ref 3.5–5.1)
Sodium: 134 mEq/L — ABNORMAL LOW (ref 135–145)

## 2012-08-09 NOTE — Patient Instructions (Addendum)
We need to check labs today  You need to start using a cane/stick so you want fall.  Stay on your current medicines  Dr. Martinique and I will see you in a month  Call the [place] office at [phone] if you have any questions, problems or concerns.

## 2012-08-09 NOTE — Progress Notes (Signed)
Scott [name] Date of Birth: [birth year] Medical Record [id number]  History of Present Illness: Scott [name] is seen back today for a follow up visit. He is seen for Dr. Martinique. He has known CAD andis s/p MI in 1997 with CABG x 3 at that time. This included a LIMA to LAD, SVG to PD, SVG to PL. He has had stenting of the SVG to the PL in October of 2005. He has had ongoing issues with shortness of breath. PFT's have shown a restrictive pattern. Sleep study has shown moderate OSA. Other issues include DM, HTN, pulmonary HTN, arthritis, and gout. EF has been low normal at 50%.   Most recently he has had issues with volume overload. Lasix was increased. Salt use is a problem and will be a chronic issue. He lives alone and does not cook. Thus he goes out a lot.   He comes in today. He is here alone. He has had the "flu" recently. Says it started a week ago. He woke up not feeling good. Had fever and chills with diarrhea. This has been off and on until about Monday. Now doing a little better. Went to the Urgent Care but had to wait too long and left without being seen. Breathing might be a little better. Still has some swelling. Still gets some salt use. Does not hurt anywhere. Remains unsteady on his feet. Has not fallen. Not really wanting to use a cane or walker. Not able to afford support stockings. Still on the increased dose of Lasix.  He is not having chest pain. Has not been to see his PCP at the [place] in some time but is due. He sees Dr. Gwenette Greet in November and notes that the CPAP has helped and he has been able to tolerate.    Current Outpatient Prescriptions on File Prior to Visit  Medication Sig Dispense Refill  . allopurinol (ZYLOPRIM) 100 MG tablet Take 200 mg by mouth daily.       Marland Kitchen aspirin 81 MG EC tablet Take 81 mg by mouth daily.        . clopidogrel (PLAVIX) 75 MG tablet Take 1 tablet (75 mg total) by mouth daily.  30 tablet  6  . colchicine 0.6 MG tablet Take 0.6 mg by mouth as needed.        .  diclofenac (VOLTAREN) 75 MG EC tablet Take 75 mg by mouth daily.        . famotidine (PEPCID) 20 MG tablet Take 20 mg by mouth at bedtime.      . furosemide (LASIX) 40 MG tablet Take 1 tablet (40 mg total) by mouth 2 (two) times daily.  180 tablet  3  . insulin aspart (NOVOLOG) 100 UNIT/ML injection Inject 10 Units into the skin 2 (two) times daily.       . insulin glargine (LANTUS) 100 UNIT/ML injection Inject 15 Units into the skin 2 (two) times daily.      Marland Kitchen labetalol (NORMODYNE) 200 MG tablet Take 200 mg by mouth 2 (two) times daily.        . Multiple Vitamin (MULTIVITAMIN) tablet Take 1 tablet by mouth daily.        Marland Kitchen omeprazole (PRILOSEC) 20 MG capsule Take 20 mg by mouth daily before breakfast.      . simvastatin (ZOCOR) 20 MG tablet Take 10 mg by mouth at bedtime.          No Known Allergies  Past Medical History  Diagnosis Date  .  Hypertension   . Diabetes mellitus   . Ischemic heart disease     dates back to 1997 --   . Myocardial infarction     had CABG x3 -- left internal mammary to the LAD, vein graft to the posterior descending, and a vein graft to the posterior lateral branch  . Hyperkalemia   . Thrombocytopenia   . Shortness of breath   . Dyslipidemia   . Arthritis   . Gout   . CAD (coronary artery disease)   . Seasonal allergies   . Sinus trouble   . Sleep apnea   . Cataract   . CHF (congestive heart failure)     Past Surgical History  Procedure Date  . Coronary artery bypass graft 1997     x3 -- left internal mammary to the LAD, vein graft to the posterior descending, and a vein graft to the posterior lateral branch  . Cardiac catheterization 2005    stenting of the vein graft to the posterior lateral per -- Dr. Martinique      . Cardiac catheterization 2011    showed right coronary, totally occluded LAD and severe stenosis in both saphenous vein graft to the posterior lateral and posterior descending  -- stenting of the proximal portion of the SVG to the  posterior lateral branch in 2/11  . Appendectomy   . Cholecystectomy   . Back surgery     15 years ago  . Colon surgery     cancerous polyps    History  Smoking status  . Former Smoker -- 1.5 packs/day for 35 years  . Types: Cigarettes  . Quit date: 10/12/1983  Smokeless tobacco  . Never Used    History  Alcohol Use  . Yes    social only    Family History  Problem Relation Age of Onset  . Coronary artery disease Father   . Heart attack Father   . Coronary artery disease Brother     1/2 brother   . Kidney disease Sister   . Cardiomyopathy Mother   . Asthma Brother     Review of Systems: The review of systems is per the HPI.  All other systems were reviewed and are negative.  Physical Exam: BP 138/66  Pulse 60  Ht 6' (1.829 m)  Wt 221 lb 1.9 oz (100.299 kg)  BMI 29.99 kg/m2  SpO2 97% Weight is unchanged today.  Patient is very pleasant and in no acute distress. He is unsteady. He looks chronically ill. Skin is warm and dry. He feels a little hot but temperature is only 97.  Color is normal.  HEENT is unremarkable. Normocephalic/atraumatic. PERRL. Sclera are nonicteric. Neck is supple. No masses. No JVD. Lungs are clear with decreased breath sounds. Cardiac exam shows a regular rate and rhythm. Abdomen is obese but soft. Extremities are with 1+ edema. Gait is unstead and ROM is intact. No gross neurologic deficits noted.  LABORATORY DATA: BMET and CBC are pending for today.   Lab Results  Component Value Date   WBC 6.5 01/18/2012   HGB 13.1 01/18/2012   HCT 40.8 01/18/2012   PLT 84.0* 01/18/2012   GLUCOSE 204* 07/19/2012   NA 138 07/19/2012   K 4.6 07/19/2012   CL 100 07/19/2012   CREATININE 1.4 07/19/2012   BUN 29* 07/19/2012   CO2 31 07/19/2012   Echo Study Conclusions from Feb 2013  - Left ventricle: The cavity size was normal. Wall thickness was increased in a pattern  of mild LVH. Systolic function was normal. The estimated ejection fraction was 50%, in the  range of 50% to 55%. - Mitral valve: Calcified annulus. Mildly thickened leaflets . Mild regurgitation. - Left atrium: The atrium was moderately to severely dilated. - Right ventricle: The cavity size was mildly dilated. - Right atrium: The atrium was moderately to severely dilated. - Pulmonary arteries: PA peak pressure: 27mm Hg (S).  Assessment / Plan:  1. Diastolic heart failure - weight is unchanged. Still with some edema. Salt use is going to be an ongoing issue. He is not on an ACE. Not sure why but with his dizziness I think that will be a problem. I have left him on his current regimen. We will recheck his labs today as well. His issues will be difficult to manage given his life style choices and finances.  2. Recent GI illness with fever - will check some labs today. He says it is getting better.   3. Unsteadiness - I strongly encouraged him to use a cane or walker. He seems pretty resistant. Also encouraged him to see his PCP.   Patient is agreeable to this plan and will call if any problems develop in the interim.

## 2012-08-10 ENCOUNTER — Other Ambulatory Visit: Payer: Self-pay | Admitting: *Deleted

## 2012-08-10 DIAGNOSIS — I25810 Atherosclerosis of coronary artery bypass graft(s) without angina pectoris: Secondary | ICD-10-CM

## 2012-08-11 DIAGNOSIS — I5042 Chronic combined systolic (congestive) and diastolic (congestive) heart failure: Secondary | ICD-10-CM

## 2012-08-11 DIAGNOSIS — N390 Urinary tract infection, site not specified: Secondary | ICD-10-CM

## 2012-08-11 HISTORY — DX: Chronic combined systolic (congestive) and diastolic (congestive) heart failure: I50.42

## 2012-08-11 HISTORY — DX: Urinary tract infection, site not specified: N39.0

## 2012-08-15 ENCOUNTER — Other Ambulatory Visit: Payer: Self-pay

## 2012-08-15 ENCOUNTER — Other Ambulatory Visit (INDEPENDENT_AMBULATORY_CARE_PROVIDER_SITE_OTHER): Payer: Medicare Other

## 2012-08-15 DIAGNOSIS — I25810 Atherosclerosis of coronary artery bypass graft(s) without angina pectoris: Secondary | ICD-10-CM

## 2012-08-15 DIAGNOSIS — E875 Hyperkalemia: Secondary | ICD-10-CM

## 2012-08-15 LAB — CBC WITH DIFFERENTIAL/PLATELET
Basophils Absolute: 0 10*3/uL (ref 0.0–0.1)
Basophils Relative: 0.5 % (ref 0.0–3.0)
Eosinophils Absolute: 0.1 10*3/uL (ref 0.0–0.7)
Eosinophils Relative: 1.1 % (ref 0.0–5.0)
HCT: 36.4 % — ABNORMAL LOW (ref 39.0–52.0)
Hemoglobin: 11.4 g/dL — ABNORMAL LOW (ref 13.0–17.0)
Lymphocytes Relative: 9.8 % — ABNORMAL LOW (ref 12.0–46.0)
Lymphs Abs: 0.8 10*3/uL (ref 0.7–4.0)
MCHC: 31.4 g/dL (ref 30.0–36.0)
MCV: 93.8 fl (ref 78.0–100.0)
Monocytes Absolute: 0.7 10*3/uL (ref 0.1–1.0)
Monocytes Relative: 9 % (ref 3.0–12.0)
Neutro Abs: 6.3 10*3/uL (ref 1.4–7.7)
Neutrophils Relative %: 79.6 % — ABNORMAL HIGH (ref 43.0–77.0)
Platelets: 138 10*3/uL — ABNORMAL LOW (ref 150.0–400.0)
RBC: 3.89 Mil/uL — ABNORMAL LOW (ref 4.22–5.81)
RDW: 19.7 % — ABNORMAL HIGH (ref 11.5–14.6)
WBC: 8 10*3/uL (ref 4.5–10.5)

## 2012-08-15 LAB — BASIC METABOLIC PANEL
BUN: 33 mg/dL — ABNORMAL HIGH (ref 6–23)
CO2: 25 mEq/L (ref 19–32)
Calcium: 8.9 mg/dL (ref 8.4–10.5)
Chloride: 104 mEq/L (ref 96–112)
Creatinine, Ser: 1.5 mg/dL (ref 0.4–1.5)
GFR: 47.79 mL/min — ABNORMAL LOW (ref >60.00)
Glucose, Bld: 86 mg/dL (ref 70–99)
Potassium: 6.1 mEq/L (ref 3.5–5.1)
Sodium: 137 mEq/L (ref 135–145)

## 2012-08-16 ENCOUNTER — Other Ambulatory Visit (INDEPENDENT_AMBULATORY_CARE_PROVIDER_SITE_OTHER): Payer: Medicare Other

## 2012-08-16 ENCOUNTER — Other Ambulatory Visit: Payer: Self-pay | Admitting: *Deleted

## 2012-08-16 DIAGNOSIS — E875 Hyperkalemia: Secondary | ICD-10-CM

## 2012-08-16 DIAGNOSIS — E876 Hypokalemia: Secondary | ICD-10-CM

## 2012-08-16 LAB — BASIC METABOLIC PANEL
BUN: 33 mg/dL — ABNORMAL HIGH (ref 6–23)
CO2: 25 mEq/L (ref 19–32)
Calcium: 8.8 mg/dL (ref 8.4–10.5)
Chloride: 105 mEq/L (ref 96–112)
Creatinine, Ser: 1.4 mg/dL (ref 0.4–1.5)
GFR: 52.58 mL/min — ABNORMAL LOW (ref >60.00)
Glucose, Bld: 153 mg/dL — ABNORMAL HIGH (ref 70–99)
Potassium: 5.2 mEq/L — ABNORMAL HIGH (ref 3.5–5.1)
Sodium: 136 mEq/L (ref 135–145)

## 2012-08-21 ENCOUNTER — Encounter: Payer: Self-pay | Admitting: Pulmonary Disease

## 2012-08-21 ENCOUNTER — Ambulatory Visit (INDEPENDENT_AMBULATORY_CARE_PROVIDER_SITE_OTHER): Payer: Medicare Other | Admitting: Pulmonary Disease

## 2012-08-21 VITALS — BP 108/60 | HR 56 | Temp 97.1°F | Ht 72.0 in | Wt 229.0 lb

## 2012-08-21 DIAGNOSIS — G4733 Obstructive sleep apnea (adult) (pediatric): Secondary | ICD-10-CM

## 2012-08-21 NOTE — Assessment & Plan Note (Signed)
The patient is doing fairly well with CPAP, but maybe having some breakthrough events which is leading to persistent nocturia.  Overall he thinks he is sleeping better, and has improved alertness during the day.  We need to optimize his pressure on the automatic setting, and the patient is agreeable.  I have also encouraged him to work aggressively on weight loss.

## 2012-08-21 NOTE — Patient Instructions (Addendum)
Will set your machine on auto setting for the next few weeks to optimize your pressure.  Will let you know the results. Work on weight loss followup with me in 50mos if doing well.

## 2012-08-21 NOTE — Progress Notes (Signed)
  Subjective:    Patient ID: Scott [name], male    DOB: Jul 08, 1933, 78 y.o.   MRN: [id number]  HPI The patient comes in today for followup of his obstructive sleep apnea.  He is wearing CPAP compliantly, and is having no issues with his mask fit or pressure.  He is seen improved sleep and daytime alertness, but is still having issues with nocturia.  I have reminded him that we have yet to optimize his pressure, although this may have nothing to do with his urinary symptoms.   Review of Systems  Constitutional: Negative for fever and unexpected weight change.  HENT: Positive for congestion and sneezing. Negative for ear pain, nosebleeds, sore throat, rhinorrhea, trouble swallowing, dental problem, postnasal drip and sinus pressure.   Eyes: Negative for redness and itching.  Respiratory: Positive for shortness of breath. Negative for cough, chest tightness and wheezing.   Cardiovascular: Negative for palpitations and leg swelling.  Gastrointestinal: Negative for nausea and vomiting.  Genitourinary: Negative for dysuria.  Musculoskeletal: Negative for joint swelling.  Skin: Negative for rash.  Neurological: Negative for headaches.  Hematological: Does not bruise/bleed easily.  Psychiatric/Behavioral: Negative for dysphoric mood. The patient is not nervous/anxious.        Objective:   Physical Exam Overweight male in no acute distress Nose without purulent discharge noted No skin breakdown or pressure necrosis from the CPAP mask Neck without lymphadenopathy or thyromegaly Lower extremities with minimal edema, no cyanosis Alert and oriented, does not appear to be sleepy, moves all 4 extremities.       Assessment & Plan:

## 2012-08-31 ENCOUNTER — Encounter: Payer: Self-pay | Admitting: Cardiology

## 2012-08-31 ENCOUNTER — Inpatient Hospital Stay (HOSPITAL_COMMUNITY): Payer: Medicare Other

## 2012-08-31 ENCOUNTER — Encounter (HOSPITAL_COMMUNITY): Payer: Self-pay | Admitting: General Practice

## 2012-08-31 ENCOUNTER — Ambulatory Visit: Payer: Medicare Other | Admitting: Nurse Practitioner

## 2012-08-31 ENCOUNTER — Inpatient Hospital Stay (HOSPITAL_COMMUNITY)
Admission: AD | Admit: 2012-08-31 | Discharge: 2012-09-05 | DRG: 287 | Disposition: A | Discharge status: Home / Self Care | Payer: Medicare Other | Source: Ambulatory Visit | Attending: Cardiology | Admitting: Cardiology

## 2012-08-31 ENCOUNTER — Telehealth: Payer: Self-pay | Admitting: Cardiology

## 2012-08-31 ENCOUNTER — Ambulatory Visit (INDEPENDENT_AMBULATORY_CARE_PROVIDER_SITE_OTHER): Payer: Medicare Other | Admitting: Cardiology

## 2012-08-31 ENCOUNTER — Other Ambulatory Visit: Payer: Medicare Other

## 2012-08-31 VITALS — BP 128/60 | HR 49 | Ht 72.0 in | Wt 225.8 lb

## 2012-08-31 DIAGNOSIS — I5043 Acute on chronic combined systolic (congestive) and diastolic (congestive) heart failure: Principal | ICD-10-CM | POA: Diagnosis present

## 2012-08-31 DIAGNOSIS — N183 Chronic kidney disease, stage 3 unspecified: Secondary | ICD-10-CM

## 2012-08-31 DIAGNOSIS — Z794 Long term (current) use of insulin: Secondary | ICD-10-CM

## 2012-08-31 DIAGNOSIS — I509 Heart failure, unspecified: Secondary | ICD-10-CM

## 2012-08-31 DIAGNOSIS — Z7982 Long term (current) use of aspirin: Secondary | ICD-10-CM

## 2012-08-31 DIAGNOSIS — Z951 Presence of aortocoronary bypass graft: Secondary | ICD-10-CM

## 2012-08-31 DIAGNOSIS — I2589 Other forms of chronic ischemic heart disease: Secondary | ICD-10-CM | POA: Diagnosis present

## 2012-08-31 DIAGNOSIS — I5033 Acute on chronic diastolic (congestive) heart failure: Secondary | ICD-10-CM

## 2012-08-31 DIAGNOSIS — I2510 Atherosclerotic heart disease of native coronary artery without angina pectoris: Secondary | ICD-10-CM

## 2012-08-31 DIAGNOSIS — Z79899 Other long term (current) drug therapy: Secondary | ICD-10-CM

## 2012-08-31 DIAGNOSIS — N390 Urinary tract infection, site not specified: Secondary | ICD-10-CM | POA: Diagnosis present

## 2012-08-31 DIAGNOSIS — I2789 Other specified pulmonary heart diseases: Secondary | ICD-10-CM | POA: Diagnosis present

## 2012-08-31 DIAGNOSIS — G4733 Obstructive sleep apnea (adult) (pediatric): Secondary | ICD-10-CM

## 2012-08-31 DIAGNOSIS — E785 Hyperlipidemia, unspecified: Secondary | ICD-10-CM

## 2012-08-31 DIAGNOSIS — I498 Other specified cardiac arrhythmias: Secondary | ICD-10-CM | POA: Diagnosis present

## 2012-08-31 DIAGNOSIS — I5023 Acute on chronic systolic (congestive) heart failure: Secondary | ICD-10-CM

## 2012-08-31 DIAGNOSIS — I252 Old myocardial infarction: Secondary | ICD-10-CM

## 2012-08-31 DIAGNOSIS — A498 Other bacterial infections of unspecified site: Secondary | ICD-10-CM | POA: Diagnosis present

## 2012-08-31 DIAGNOSIS — E119 Type 2 diabetes mellitus without complications: Secondary | ICD-10-CM

## 2012-08-31 DIAGNOSIS — M109 Gout, unspecified: Secondary | ICD-10-CM | POA: Diagnosis present

## 2012-08-31 DIAGNOSIS — I129 Hypertensive chronic kidney disease with stage 1 through stage 4 chronic kidney disease, or unspecified chronic kidney disease: Secondary | ICD-10-CM | POA: Diagnosis present

## 2012-08-31 DIAGNOSIS — I10 Essential (primary) hypertension: Secondary | ICD-10-CM

## 2012-08-31 DIAGNOSIS — I255 Ischemic cardiomyopathy: Secondary | ICD-10-CM

## 2012-08-31 HISTORY — DX: Gastro-esophageal reflux disease without esophagitis: K21.9

## 2012-08-31 HISTORY — DX: Chronic combined systolic (congestive) and diastolic (congestive) heart failure: I50.42

## 2012-08-31 HISTORY — DX: Urinary tract infection, site not specified: N39.0

## 2012-08-31 HISTORY — DX: Gout, unspecified: M10.9

## 2012-08-31 LAB — URINALYSIS, ROUTINE W REFLEX MICROSCOPIC
Bilirubin Urine: NEGATIVE
Glucose, UA: NEGATIVE mg/dL
Ketones, ur: NEGATIVE mg/dL
Nitrite: NEGATIVE
Protein, ur: NEGATIVE mg/dL
Specific Gravity, Urine: 1.013 (ref 1.005–1.030)
Urobilinogen, UA: 1 mg/dL (ref 0.0–1.0)
pH: 6 (ref 5.0–8.0)

## 2012-08-31 LAB — URINE MICROSCOPIC-ADD ON

## 2012-08-31 LAB — TROPONIN I
Troponin I: <0.3 ng/mL (ref <0.30)
Troponin I: <0.3 ng/mL (ref <0.30)

## 2012-08-31 LAB — GLUCOSE, CAPILLARY
Glucose-Capillary: 117 mg/dL — ABNORMAL HIGH (ref 70–99)
Glucose-Capillary: 54 mg/dL — ABNORMAL LOW (ref 70–99)
Glucose-Capillary: 56 mg/dL — ABNORMAL LOW (ref 70–99)
Glucose-Capillary: 74 mg/dL (ref 70–99)

## 2012-08-31 LAB — COMPREHENSIVE METABOLIC PANEL
ALT: 18 U/L (ref 0–53)
AST: 29 U/L (ref 0–37)
Albumin: 3.4 g/dL — ABNORMAL LOW (ref 3.5–5.2)
Alkaline Phosphatase: 185 U/L — ABNORMAL HIGH (ref 39–117)
BUN: 25 mg/dL — ABNORMAL HIGH (ref 6–23)
CO2: 28 mEq/L (ref 19–32)
Calcium: 9.5 mg/dL (ref 8.4–10.5)
Chloride: 101 mEq/L (ref 96–112)
Creatinine, Ser: 1.2 mg/dL (ref 0.50–1.35)
GFR calc Af Amer: 66 mL/min — ABNORMAL LOW (ref >90)
GFR calc non Af Amer: 57 mL/min — ABNORMAL LOW (ref >90)
Glucose, Bld: 127 mg/dL — ABNORMAL HIGH (ref 70–99)
Potassium: 4.9 mEq/L (ref 3.5–5.1)
Sodium: 139 mEq/L (ref 135–145)
Total Bilirubin: 1.1 mg/dL (ref 0.3–1.2)
Total Protein: 7.6 g/dL (ref 6.0–8.3)

## 2012-08-31 LAB — CBC WITH DIFFERENTIAL/PLATELET
Basophils Absolute: 0 10*3/uL (ref 0.0–0.1)
Basophils Relative: 0 % (ref 0–1)
Eosinophils Absolute: 0.2 10*3/uL (ref 0.0–0.7)
Eosinophils Relative: 4 % (ref 0–5)
HCT: 36.7 % — ABNORMAL LOW (ref 39.0–52.0)
Hemoglobin: 11.7 g/dL — ABNORMAL LOW (ref 13.0–17.0)
Lymphocytes Relative: 18 % (ref 12–46)
Lymphs Abs: 1 10*3/uL (ref 0.7–4.0)
MCH: 29.9 pg (ref 26.0–34.0)
MCHC: 31.9 g/dL (ref 30.0–36.0)
MCV: 93.9 fL (ref 78.0–100.0)
Monocytes Absolute: 0.9 10*3/uL (ref 0.1–1.0)
Monocytes Relative: 16 % — ABNORMAL HIGH (ref 3–12)
Neutro Abs: 3.4 10*3/uL (ref 1.7–7.7)
Neutrophils Relative %: 61 % (ref 43–77)
Platelets: 79 10*3/uL — ABNORMAL LOW (ref 150–400)
RBC: 3.91 MIL/uL — ABNORMAL LOW (ref 4.22–5.81)
RDW: 18.7 % — ABNORMAL HIGH (ref 11.5–15.5)
WBC: 5.5 10*3/uL (ref 4.0–10.5)

## 2012-08-31 LAB — PRO B NATRIURETIC PEPTIDE: Pro B Natriuretic peptide (BNP): 2864 pg/mL — ABNORMAL HIGH (ref 0–450)

## 2012-08-31 LAB — MAGNESIUM: Magnesium: 1.7 mg/dL (ref 1.5–2.5)

## 2012-08-31 LAB — PROTIME-INR
INR: 1.29 (ref 0.00–1.49)
Prothrombin Time: 15.8 seconds — ABNORMAL HIGH (ref 11.6–15.2)

## 2012-08-31 LAB — HEMOGLOBIN A1C
Hgb A1c MFr Bld: 7 % — ABNORMAL HIGH (ref <5.7)
Mean Plasma Glucose: 154 mg/dL — ABNORMAL HIGH (ref <117)

## 2012-08-31 LAB — APTT: aPTT: 35 seconds (ref 24–37)

## 2012-08-31 MED ORDER — ALLOPURINOL 100 MG PO TABS
200.0000 mg | ORAL_TABLET | Freq: Every day | ORAL | Status: DC
  Administered 2012-08-31 – 2012-09-05 (×6): 200 mg via ORAL
  Filled 2012-08-31 (×6): qty 2

## 2012-08-31 MED ORDER — INSULIN REGULAR HUMAN 100 UNIT/ML IJ SOLN
10.0000 [IU] | Freq: Two times a day (BID) | INTRAMUSCULAR | Status: DC
  Filled 2012-08-31 (×17): qty 0.1

## 2012-08-31 MED ORDER — FAMOTIDINE 20 MG PO TABS
20.0000 mg | ORAL_TABLET | Freq: Every day | ORAL | Status: DC
  Administered 2012-08-31 – 2012-09-04 (×5): 20 mg via ORAL
  Filled 2012-08-31 (×6): qty 1

## 2012-08-31 MED ORDER — INSULIN ASPART 100 UNIT/ML ~~LOC~~ SOLN
0.0000 [IU] | Freq: Three times a day (TID) | SUBCUTANEOUS | Status: DC
  Administered 2012-09-02: 2 [IU] via SUBCUTANEOUS
  Administered 2012-09-02 – 2012-09-03 (×3): 3 [IU] via SUBCUTANEOUS

## 2012-08-31 MED ORDER — ENOXAPARIN SODIUM 40 MG/0.4ML ~~LOC~~ SOLN
40.0000 mg | SUBCUTANEOUS | Status: DC
  Administered 2012-08-31 – 2012-09-03 (×4): 40 mg via SUBCUTANEOUS
  Filled 2012-08-31 (×6): qty 0.4

## 2012-08-31 MED ORDER — SODIUM CHLORIDE 0.9 % IV SOLN: 250.0000 mL | INTRAVENOUS | Status: DC | PRN

## 2012-08-31 MED ORDER — ONE-DAILY MULTI VITAMINS PO TABS
1.0000 | ORAL_TABLET | Freq: Every day | ORAL | Status: DC
Start: 2012-08-31 — End: 2012-08-31

## 2012-08-31 MED ORDER — ONDANSETRON HCL 4 MG/2ML IJ SOLN: 4.0000 mg | Freq: Four times a day (QID) | INTRAMUSCULAR | Status: DC | PRN

## 2012-08-31 MED ORDER — ZOLPIDEM TARTRATE 5 MG PO TABS
5.0000 mg | ORAL_TABLET | Freq: Every evening | ORAL | Status: DC | PRN
  Administered 2012-09-01 – 2012-09-04 (×4): 5 mg via ORAL
  Filled 2012-08-31 (×4): qty 1

## 2012-08-31 MED ORDER — INSULIN ASPART 100 UNIT/ML ~~LOC~~ SOLN: 0.0000 [IU] | Freq: Every day | SUBCUTANEOUS | Status: DC

## 2012-08-31 MED ORDER — PANTOPRAZOLE SODIUM 40 MG PO TBEC
40.0000 mg | DELAYED_RELEASE_TABLET | Freq: Every day | ORAL | Status: DC
  Administered 2012-08-31 – 2012-09-05 (×6): 40 mg via ORAL
  Filled 2012-08-31 (×4): qty 1

## 2012-08-31 MED ORDER — INSULIN REGULAR HUMAN 100 UNIT/ML IJ SOLN
10.0000 [IU] | Freq: Two times a day (BID) | INTRAMUSCULAR | Status: DC
  Administered 2012-08-31: 7 [IU] via SUBCUTANEOUS
  Administered 2012-09-01: 10 [IU] via SUBCUTANEOUS
  Filled 2012-08-31 (×17): qty 0.1

## 2012-08-31 MED ORDER — ALPRAZOLAM 0.25 MG PO TABS
0.2500 mg | ORAL_TABLET | Freq: Two times a day (BID) | ORAL | Status: DC | PRN
  Filled 2012-08-31: qty 1

## 2012-08-31 MED ORDER — ACETAMINOPHEN 325 MG PO TABS: 650.0000 mg | ORAL_TABLET | ORAL | Status: DC | PRN

## 2012-08-31 MED ORDER — NITROGLYCERIN 0.4 MG SL SUBL: 0.4000 mg | SUBLINGUAL_TABLET | SUBLINGUAL | Status: DC | PRN

## 2012-08-31 MED ORDER — COLCHICINE 0.6 MG PO TABS
0.6000 mg | ORAL_TABLET | Freq: Three times a day (TID) | ORAL | Status: DC | PRN
  Administered 2012-09-01 – 2012-09-04 (×7): 0.6 mg via ORAL
  Filled 2012-08-31 (×9): qty 1

## 2012-08-31 MED ORDER — CARVEDILOL 12.5 MG PO TABS
12.5000 mg | ORAL_TABLET | Freq: Two times a day (BID) | ORAL | Status: DC
  Administered 2012-08-31 – 2012-09-05 (×9): 12.5 mg via ORAL
  Filled 2012-08-31 (×12): qty 1

## 2012-08-31 MED ORDER — SODIUM CHLORIDE 0.9 % IJ SOLN
3.0000 mL | INTRAMUSCULAR | Status: DC | PRN
  Administered 2012-09-02 – 2012-09-03 (×2): 3 mL via INTRAVENOUS

## 2012-08-31 MED ORDER — SODIUM CHLORIDE 0.9 % IJ SOLN
3.0000 mL | Freq: Two times a day (BID) | INTRAMUSCULAR | Status: DC
  Administered 2012-08-31 – 2012-09-04 (×10): 3 mL via INTRAVENOUS

## 2012-08-31 MED ORDER — CLOPIDOGREL BISULFATE 75 MG PO TABS
75.0000 mg | ORAL_TABLET | Freq: Every day | ORAL | Status: DC
  Administered 2012-08-31 – 2012-09-05 (×6): 75 mg via ORAL
  Filled 2012-08-31 (×6): qty 1

## 2012-08-31 MED ORDER — ADULT MULTIVITAMIN W/MINERALS CH
1.0000 | ORAL_TABLET | Freq: Every day | ORAL | Status: DC
  Administered 2012-08-31 – 2012-09-05 (×6): 1 via ORAL
  Filled 2012-08-31 (×6): qty 1

## 2012-08-31 MED ORDER — SIMVASTATIN 10 MG PO TABS
10.0000 mg | ORAL_TABLET | Freq: Every day | ORAL | Status: DC
  Administered 2012-08-31 – 2012-09-04 (×5): 10 mg via ORAL
  Filled 2012-08-31 (×6): qty 1

## 2012-08-31 MED ORDER — INSULIN NPH (HUMAN) (ISOPHANE) 100 UNIT/ML ~~LOC~~ SUSP
15.0000 [IU] | Freq: Two times a day (BID) | SUBCUTANEOUS | Status: DC
  Administered 2012-08-31: 10 [IU] via SUBCUTANEOUS
  Administered 2012-09-01: 8 [IU] via SUBCUTANEOUS
  Administered 2012-09-01: 10 [IU] via SUBCUTANEOUS
  Administered 2012-09-02: 8 [IU] via SUBCUTANEOUS
  Filled 2012-08-31 (×4): qty 10

## 2012-08-31 MED ORDER — ASPIRIN 81 MG PO TBEC: 81.0000 mg | DELAYED_RELEASE_TABLET | Freq: Every day | ORAL | Status: DC

## 2012-08-31 MED ORDER — FUROSEMIDE 10 MG/ML IJ SOLN
80.0000 mg | Freq: Two times a day (BID) | INTRAMUSCULAR | Status: DC
  Administered 2012-08-31 – 2012-09-02 (×4): 80 mg via INTRAVENOUS
  Filled 2012-08-31 (×7): qty 8

## 2012-08-31 MED ORDER — ASPIRIN EC 81 MG PO TBEC
81.0000 mg | DELAYED_RELEASE_TABLET | Freq: Every day | ORAL | Status: DC
  Administered 2012-08-31 – 2012-09-05 (×6): 81 mg via ORAL
  Filled 2012-08-31 (×6): qty 1

## 2012-08-31 NOTE — Progress Notes (Signed)
Patient has arrived to unit as a direct admission, will continue to monitor patient

## 2012-08-31 NOTE — Progress Notes (Signed)
Hypoglycemic Event  CBG: 56  Treatment: 15 GM carbohydrate snack  Symptoms: Shaky  Follow-up CBG: Time:21:57 CBG Result:74  Possible Reasons for Event: Inadequate meal intake  Comments/MD notified: Pt given graham crackers and peanut butter once blood sugar re-checked.     Elyn Aquas A  Remember to initiate Hypoglycemia Order Set & complete

## 2012-08-31 NOTE — Patient Instructions (Signed)
Recommend hospitalization today for IV diuresis

## 2012-08-31 NOTE — Progress Notes (Signed)
Patient is currently being transported to radiology for chest xray.  Ruben Im RN

## 2012-08-31 NOTE — Progress Notes (Signed)
[name] Date of Birth: 08/23/1933 Medical Record [id number]  History of Present Illness: [name] is seen today as a work in visit. He was seen on October 30 with congestive heart failure. His Lasix was increased to 40 mg twice a day but his renal function had deteriorated with a creatinine of 2. It was then decreased to 40 mg per day. Since then he has noticed no improvement. In fact over the past week he has had a significant increase in weight gain, marked increase in abdominal girth, scrotal and penile edema, and lower extremity edema. He has increased shortness of breath. He does eat out a lot and sodium intake has been a persistent problem. He drinks V8 juice regularly. He has a history of coronary disease and is status post CABG in 1997. He had stenting of the saphenous vein graft to the posterior lateral branch in October 2005. In February 2011 he had repeat stenting of this vein graft as well as stenting of the saphenous vein graft to the PDA. Ejection fraction at that time was 45%. Echocardiogram on 11/12/2011 showed an ejection fraction of 50-55%. He had moderate to severe left atrial enlargement and severe right atrial enlargement. Estimated PA pressure was 60 mm of mercury.  Current Outpatient Prescriptions on File Prior to Visit  Medication Sig Dispense Refill  . allopurinol (ZYLOPRIM) 100 MG tablet Take 200 mg by mouth daily.       Marland Kitchen aspirin 81 MG EC tablet Take 81 mg by mouth daily.        . clopidogrel (PLAVIX) 75 MG tablet Take 1 tablet (75 mg total) by mouth daily.  30 tablet  6  . colchicine 0.6 MG tablet Take 0.6 mg by mouth as needed.        . diclofenac (VOLTAREN) 75 MG EC tablet Take 75 mg by mouth daily.        . famotidine (PEPCID) 20 MG tablet Take 20 mg by mouth at bedtime.      . insulin aspart (NOVOLOG) 100 UNIT/ML injection Inject 10 Units into the skin 2 (two) times daily.       . insulin glargine (LANTUS) 100 UNIT/ML injection Inject 15 Units into the skin 2  (two) times daily.      Marland Kitchen labetalol (NORMODYNE) 200 MG tablet Take 200 mg by mouth 2 (two) times daily.        . Multiple Vitamin (MULTIVITAMIN) tablet Take 1 tablet by mouth daily.        Marland Kitchen omeprazole (PRILOSEC) 20 MG capsule Take 20 mg by mouth daily before breakfast.      . simvastatin (ZOCOR) 20 MG tablet Take 10 mg by mouth at bedtime.        . [DISCONTINUED] furosemide (LASIX) 40 MG tablet Take 1 tablet (40 mg total) by mouth 2 (two) times daily.  180 tablet  3    No Known Allergies  Past Medical History  Diagnosis Date  . Hypertension   . Diabetes mellitus   . Ischemic heart disease     dates back to 1997 --   . Myocardial infarction     had CABG x3 -- left internal mammary to the LAD, vein graft to the posterior descending, and a vein graft to the posterior lateral branch  . Hyperkalemia   . Thrombocytopenia   . Shortness of breath   . Dyslipidemia   . Arthritis   . Gout   . CAD (coronary artery disease)   .  Seasonal allergies   . Sinus trouble   . Sleep apnea   . Cataract   . CHF (congestive heart failure)     Past Surgical History  Procedure Date  . Coronary artery bypass graft 1997     x3 -- left internal mammary to the LAD, vein graft to the posterior descending, and a vein graft to the posterior lateral branch  . Cardiac catheterization 2005    stenting of the vein graft to the posterior lateral per -- Dr. Martinique      . Cardiac catheterization 2011    showed right coronary, totally occluded LAD and severe stenosis in both saphenous vein graft to the posterior lateral and posterior descending  -- stenting of the proximal portion of the SVG to the posterior lateral branch in 2/11  . Appendectomy   . Cholecystectomy   . Back surgery     15 years ago  . Colon surgery     cancerous polyps    History  Smoking status  . Former Smoker -- 1.5 packs/day for 35 years  . Types: Cigarettes  . Quit date: 10/12/1983  Smokeless tobacco  . Never Used    History    Alcohol Use  . Yes    Comment: social only    Family History  Problem Relation Age of Onset  . Coronary artery disease Father   . Heart attack Father   . Coronary artery disease Brother     1/2 brother   . Kidney disease Sister   . Cardiomyopathy Mother   . Asthma Brother     Review of Systems: The review of systems is positive for gout. He reports his doctor at the [place] has increased his allopurinol to 300 mg twice a day. He uses colchicine about every other day. He has been using diclofenac intermittently. All other systems were reviewed and are negative.  Physical Exam: BP 128/60  Pulse 49  Ht 6' (1.829 m)  Wt 225 lb 12.8 oz (102.422 kg)  BMI 30.62 kg/m2  SpO2 97% He is a pleasant, elderly white male in no acute distress. HEENT exam: Normocephalic, atraumatic. Balding. Pupils are equal round and reactive to light and accommodation. Extraocular movements are full. Sclera are clear. Oropharynx is clear. He has upper and lower dentures. Neck is supple without adenopathy, thyromegaly, or bruits. He has jugular venous distention to the angle of the jaw. Lungs: Bibasilar rales. Breathing is unlabored. Cardiovascular: Bradycardic. Regular rate without gallop. PMI is nonpalpable. No murmur. Abdomen: Markedly distended and edematous. Bowel sounds are positive. No masses. Liver is enlarged to percussion. Patient has significant scrotal and penile edema. Extremities: 3+ edema. No cyanosis. Pedal pulses are difficult to palpate due to to edema. Skin: Diffuse red scaly rash on the abdomen. Neuro: Alert and oriented x3. Cranial nerves II through XII are intact. Normal motor and sensory exam. LABORATORY DATA: ECG demonstrates marked sinus bradycardia with a rate of 49 beats per minute and first degree AV block. ST and T wave changes consistent with inferior lateral ischemia.  Assessment / Plan: 1. Congestive heart failure acute on chronic diastolic and systolic dysfunction with class IV  symptoms. Anasarca on exam. Outpatient therapy has been unsuccessful. Patient be admitted for IV diuresis with Lasix. Would begin with 80 mg twice a day. He needs sodium and fluid restriction. Would recommend switching his labetalol to carvedilol initially 12.5 mg twice a day. We'll need to watch closely for bradycardia. Recommend complete cessation of any nonsteroidal anti-inflammatory drugs.  He needs dietary teaching. We will update his echocardiogram. If LV function has deteriorated he may need cardiac catheterization to further assess his hemodynamic status and to rule out recurrent ischemia. We'll check baseline lab work including chemistries, CBC, TSH, BNP, and urinalysis. We'll check chest x-ray.  2. Coronary artery disease status post CABG in 1997. Status post multiple stent procedures and saphenous vein graft to the PDA and posterior lateral branches. Continue aspirin and Plavix.  3. Gout. Given his renal insufficiency he should not be on this high dose of allopurinol. Recommended reducing dose to 200 mg daily. He may use colchicine for acute flares.  4. Diabetes mellitus, insulin requiring  5. Chronic kidney disease stage III.  6. Hypertension.  7. Hyperlipidemia on simvastatin. Will reassess lipid status in the hospital.

## 2012-08-31 NOTE — Telephone Encounter (Signed)
New problem:    C/O swelling in both feet's &  legs .

## 2012-08-31 NOTE — Telephone Encounter (Signed)
Patient called stated he is having swelling in lower legs radiating up into groin and abdomen.States he is sob.States urine is dark yellow and has a foul odor.States had to cancel appointment with Scott Merle NP today,  thought he was going to [place] but changed his mind.Patient was told to come to office now Dr.Jordan will see you.

## 2012-08-31 NOTE — H&P (Signed)
Scott [name]  Date of Birth: June 21, 1933  Medical Record [id number]  History of Present Illness:  [name] is seen today as a work in visit. He was seen on October 30 with congestive heart failure. His Lasix was increased to 40 mg twice a day but his renal function had deteriorated with a creatinine of 2. It was then decreased to 40 mg per day. Since then he has noticed no improvement. In fact over the past week he has had a significant increase in weight gain, marked increase in abdominal girth, scrotal and penile edema, and lower extremity edema. He has increased shortness of breath. He does eat out a lot and sodium intake has been a persistent problem. He drinks V8 juice regularly.  He has a history of coronary disease and is status post CABG in 1997. He had stenting of the saphenous vein graft to the posterior lateral branch in October 2005. In February 2011 he had repeat stenting of this vein graft as well as stenting of the saphenous vein graft to the PDA. Ejection fraction at that time was 45%. Echocardiogram on 11/12/2011 showed an ejection fraction of 50-55%. He had moderate to severe left atrial enlargement and severe right atrial enlargement. Estimated PA pressure was 60 mm of mercury.  Current Outpatient Prescriptions on File Prior to Visit   Medication  Sig  Dispense  Refill   .  allopurinol (ZYLOPRIM) 100 MG tablet  Take 200 mg by mouth daily.     Marland Kitchen  aspirin 81 MG EC tablet  Take 81 mg by mouth daily.     .  clopidogrel (PLAVIX) 75 MG tablet  Take 1 tablet (75 mg total) by mouth daily.  30 tablet  6   .  colchicine 0.6 MG tablet  Take 0.6 mg by mouth as needed.     .  diclofenac (VOLTAREN) 75 MG EC tablet  Take 75 mg by mouth daily.     .  famotidine (PEPCID) 20 MG tablet  Take 20 mg by mouth at bedtime.     .  insulin aspart (NOVOLOG) 100 UNIT/ML injection  Inject 10 Units into the skin 2 (two) times daily.     .  insulin glargine (LANTUS) 100 UNIT/ML injection  Inject 15 Units into the  skin 2 (two) times daily.     Marland Kitchen  labetalol (NORMODYNE) 200 MG tablet  Take 200 mg by mouth 2 (two) times daily.     .  Multiple Vitamin (MULTIVITAMIN) tablet  Take 1 tablet by mouth daily.     Marland Kitchen  omeprazole (PRILOSEC) 20 MG capsule  Take 20 mg by mouth daily before breakfast.     .  simvastatin (ZOCOR) 20 MG tablet  Take 10 mg by mouth at bedtime.     .  [DISCONTINUED] furosemide (LASIX) 40 MG tablet  Take 1 tablet (40 mg total) by mouth 2 (two) times daily.  180 tablet  3    No Known Allergies  Past Medical History   Diagnosis  Date   .  Hypertension    .  Diabetes mellitus    .  Ischemic heart disease      dates back to 1997 --   .  Myocardial infarction      had CABG x3 -- left internal mammary to the LAD, vein graft to the posterior descending, and a vein graft to the posterior lateral branch   .  Hyperkalemia    .  Thrombocytopenia    .  Shortness of breath    .  Dyslipidemia    .  Arthritis    .  Gout    .  CAD (coronary artery disease)    .  Seasonal allergies    .  Sinus trouble    .  Sleep apnea    .  Cataract    .  CHF (congestive heart failure)     Past Surgical History   Procedure  Date   .  Coronary artery bypass graft  1997     x3 -- left internal mammary to the LAD, vein graft to the posterior descending, and a vein graft to the posterior lateral branch   .  Cardiac catheterization  2005     stenting of the vein graft to the posterior lateral per -- Dr. Martinique   .  Cardiac catheterization  2011     showed right coronary, totally occluded LAD and severe stenosis in both saphenous vein graft to the posterior lateral and posterior descending -- stenting of the proximal portion of the SVG to the posterior lateral branch in 2/11   .  Appendectomy    .  Cholecystectomy    .  Back surgery      15 years ago   .  Colon surgery      cancerous polyps    History   Smoking status   .  Former Smoker -- 1.5 packs/day for 35 years   .  Types:  Cigarettes   .  Quit date:   10/12/1983   Smokeless tobacco   .  Never Used    History   Alcohol Use   .  Yes     Comment: social only    Family History   Problem  Relation  Age of Onset   .  Coronary artery disease  Father    .  Heart attack  Father    .  Coronary artery disease  Brother       1/2 brother    .  Kidney disease  Sister    .  Cardiomyopathy  Mother    .  Asthma  Brother     Review of Systems:  The review of systems is positive for gout. He reports his doctor at the [place] has increased his allopurinol to 300 mg twice a day. He uses colchicine about every other day. He has been using diclofenac intermittently. All other systems were reviewed and are negative.  Physical Exam:  BP 128/60  Pulse 49  Ht 6' (1.829 m)  Wt 225 lb 12.8 oz (102.422 kg)  BMI 30.62 kg/m2  SpO2 97%  He is a pleasant, elderly white male in no acute distress.  HEENT exam: Normocephalic, atraumatic. Balding. Pupils are equal round and reactive to light and accommodation. Extraocular movements are full. Sclera are clear. Oropharynx is clear. He has upper and lower dentures. Neck is supple without adenopathy, thyromegaly, or bruits. He has jugular venous distention to the angle of the jaw.  Lungs: Bibasilar rales. Breathing is unlabored.  Cardiovascular: Bradycardic. Regular rate without gallop. PMI is nonpalpable. No murmur.  Abdomen: Markedly distended and edematous. Bowel sounds are positive. No masses. Liver is enlarged to percussion. Patient has significant scrotal and penile edema.  Extremities: 3+ edema. No cyanosis. Pedal pulses are difficult to palpate due to to edema.  Skin: Diffuse red scaly rash on the abdomen.  Neuro: Alert and oriented x3. Cranial nerves II through XII are intact. Normal motor and sensory  exam.  LABORATORY DATA:  ECG demonstrates marked sinus bradycardia with a rate of 49 beats per minute and first degree AV block. ST and T wave changes consistent with inferior lateral ischemia.  Assessment /  Plan:  1. Congestive heart failure acute on chronic diastolic and systolic dysfunction with class IV symptoms. Anasarca on exam. Outpatient therapy has been unsuccessful. Patient be admitted for IV diuresis with Lasix. Would begin with 80 mg twice a day. He needs sodium and fluid restriction. Would recommend switching his labetalol to carvedilol initially 12.5 mg twice a day. We'll need to watch closely for bradycardia. Recommend complete cessation of any nonsteroidal anti-inflammatory drugs. He needs dietary teaching. We will update his echocardiogram. If LV function has deteriorated he may need cardiac catheterization to further assess his hemodynamic status and to rule out recurrent ischemia. We'll check baseline lab work including chemistries, CBC, TSH, BNP, and urinalysis. We'll check chest x-ray.  2. Coronary artery disease status post CABG in 1997. Status post multiple stent procedures and saphenous vein graft to the PDA and posterior lateral branches. Continue aspirin and Plavix.  3. Gout. Given his renal insufficiency he should not be on this high dose of allopurinol. Recommended reducing dose to 200 mg daily. He may use colchicine for acute flares.  4. Diabetes mellitus, insulin requiring  5. Chronic kidney disease stage III.  6. Hypertension.  7. Hyperlipidemia on simvastatin. Will reassess lipid status in the hospital.

## 2012-08-31 NOTE — Progress Notes (Signed)
Patient has returned to unit from radiology, patient is stable; will continue to monitor patient. Ruben Im RN

## 2012-09-01 DIAGNOSIS — N183 Chronic kidney disease, stage 3 unspecified: Secondary | ICD-10-CM | POA: Diagnosis present

## 2012-09-01 DIAGNOSIS — I10 Essential (primary) hypertension: Secondary | ICD-10-CM

## 2012-09-01 DIAGNOSIS — I059 Rheumatic mitral valve disease, unspecified: Secondary | ICD-10-CM

## 2012-09-01 DIAGNOSIS — I2510 Atherosclerotic heart disease of native coronary artery without angina pectoris: Secondary | ICD-10-CM

## 2012-09-01 DIAGNOSIS — E119 Type 2 diabetes mellitus without complications: Secondary | ICD-10-CM

## 2012-09-01 DIAGNOSIS — G4733 Obstructive sleep apnea (adult) (pediatric): Secondary | ICD-10-CM

## 2012-09-01 LAB — BASIC METABOLIC PANEL
BUN: 27 mg/dL — ABNORMAL HIGH (ref 6–23)
CO2: 29 mEq/L (ref 19–32)
Calcium: 9.2 mg/dL (ref 8.4–10.5)
Chloride: 95 mEq/L — ABNORMAL LOW (ref 96–112)
Creatinine, Ser: 1.27 mg/dL (ref 0.50–1.35)
GFR calc Af Amer: 61 mL/min — ABNORMAL LOW (ref >90)
GFR calc non Af Amer: 53 mL/min — ABNORMAL LOW (ref >90)
Glucose, Bld: 123 mg/dL — ABNORMAL HIGH (ref 70–99)
Potassium: 4.1 mEq/L (ref 3.5–5.1)
Sodium: 132 mEq/L — ABNORMAL LOW (ref 135–145)

## 2012-09-01 LAB — LIPID PANEL
Cholesterol: 72 mg/dL (ref 0–200)
HDL: 38 mg/dL — ABNORMAL LOW (ref >39)
LDL Cholesterol: 23 mg/dL (ref 0–99)
Total CHOL/HDL Ratio: 1.9 RATIO
Triglycerides: 55 mg/dL (ref <150)
VLDL: 11 mg/dL (ref 0–40)

## 2012-09-01 LAB — GLUCOSE, CAPILLARY
Glucose-Capillary: 115 mg/dL — ABNORMAL HIGH (ref 70–99)
Glucose-Capillary: 126 mg/dL — ABNORMAL HIGH (ref 70–99)
Glucose-Capillary: 113 mg/dL — ABNORMAL HIGH (ref 70–99)
Glucose-Capillary: 98 mg/dL (ref 70–99)

## 2012-09-01 LAB — TROPONIN I: Troponin I: <0.3 ng/mL (ref <0.30)

## 2012-09-01 LAB — TSH: TSH: 1.942 u[IU]/mL (ref 0.350–4.500)

## 2012-09-01 MED ORDER — INSULIN ASPART 100 UNIT/ML ~~LOC~~ SOLN
10.0000 [IU] | Freq: Two times a day (BID) | SUBCUTANEOUS | Status: DC
  Administered 2012-09-01 – 2012-09-02 (×2): 5 [IU] via SUBCUTANEOUS

## 2012-09-01 NOTE — Plan of Care (Signed)
Problem: Phase I Progression Outcomes Goal: EF % per last Echo/documented,Core Reminder form on chart Outcome: Completed/Met Date Met:  09/01/12 EF 25-30% per echo on 09/01/2012

## 2012-09-01 NOTE — Progress Notes (Signed)
TELEMETRY: Reviewed telemetry pt in sinus brady. 9 beat run of NSVT: Filed Vitals:   09/01/12 0442 09/01/12 0612 09/01/12 1022 09/01/12 1456  BP: 130/60 139/69 130/66 149/69  Pulse:  55 53 54  Temp:  98 F (36.7 C)  98.4 F (36.9 C)  TempSrc:  Oral  Oral  Resp:   18 17  Height:      Weight:  98.793 kg (217 lb 12.8 oz)    SpO2:  98% 99% 99%    Intake/Output Summary (Last 24 hours) at 09/01/12 1458 Last data filed at 09/01/12 1444  Gross per 24 hour  Intake    783 ml  Output   5000 ml  Net  -4217 ml    SUBJECTIVE Feels OK. Diuresing well. No chest pain.  LABS: Basic Metabolic Panel:  Basename 09/01/12 0300 08/31/12 1525  NA 132* 139  K 4.1 4.9  CL 95* 101  CO2 29 28  GLUCOSE 123* 127*  BUN 27* 25*  CREATININE 1.27 1.20  CALCIUM 9.2 9.5  MG -- 1.7  PHOS -- --   Liver Function Tests:  Basename 08/31/12 1525  AST 29  ALT 18  ALKPHOS 185*  BILITOT 1.1  PROT 7.6  ALBUMIN 3.4*   No results found for this basename: LIPASE:2,AMYLASE:2 in the last 72 hours CBC:  Basename 08/31/12 1525  WBC 5.5  NEUTROABS 3.4  HGB 11.7*  HCT 36.7*  MCV 93.9  PLT 79*   Cardiac Enzymes:  Basename 09/01/12 0300 08/31/12 2034 08/31/12 1525  CKTOTAL -- -- --  CKMB -- -- --  CKMBINDEX -- -- --  TROPONINI <0.30 <0.30 <0.30   BNP: 2864  Hemoglobin A1C:  Basename 08/31/12 1525  HGBA1C 7.0*   Fasting Lipid Panel:  Basename 09/01/12 0300  CHOL 72  HDL 38*  LDLCALC 23  TRIG 55  CHOLHDL 1.9  LDLDIRECT --   Thyroid Function Tests:  Basename 08/31/12 1726  TSH 1.942  T4TOTAL --  T3FREE --  THYROIDAB --   Radiology/Studies:  X-ray Chest Pa And Lateral  08/31/2012  *RADIOLOGY REPORT*  Clinical Data: Short of breath, CHF  CHEST - 2 VIEW  Comparison: Chest x-ray of 01/19/2012  Findings: Moderate cardiomegaly is present and there are somewhat prominent interstitial markings at the bases suggestive of mild interstitial edema.  No effusion is seen.  Median  sternotomy sutures are noted from prior CABG.  No acute bony abnormality is seen.  IMPRESSION: Cardiomegaly.  Question mild interstitial edema   Original Report Authenticated By: Ivar Drape, M.D.    Echo: pending  PHYSICAL EXAM General: elderly, well nourished, in no acute distress. Head: Normocephalic, atraumatic, sclera non-icteric, no xanthomas, nares are without discharge. Neck: Negative for carotid bruits. JVD elevated to angle of jaw Lungs: Clear bilaterally to auscultation without wheezes, rales, or rhonchi. Breathing is unlabored. Heart: RRR S1 S2 without murmurs, rubs, or gallops.  Abdomen: distended, edematous, + ascites. No rebound/guarding. No obvious abdominal masses. Msk:  Strength and tone appears normal for age. Extremities: 3+ edema.  Distal pedal pulses are 2+ and equal bilaterally. Neuro: Alert and oriented X 3. Moves all extremities spontaneously. Psych:  Responds to questions appropriately with a normal affect.  ASSESSMENT AND PLAN: 1. Acute on chronic systolic and diastolic dysfunction with severe right heart failure. Good initial response to diuresis. Echo pending. Continue coreg and IV lasix. Hold ACEi and ARB for now. May consider adding if renal function remains stable. Continue sodium and fluid restriction. Dietary counseling. Still  has about 15 pounds to lose.  2. CAD s/p CABG 1997. S/p stents to SVGs- last in 2011. Enzymes negative.  3. CKD stage 3. Monitor with diuresis  4. DM on insulin  5. HTN  6. Hyperlipidemia controlled on simvastatin.   Principal Problem:  *Acute on chronic diastolic CHF (congestive heart failure) Active Problems:  CAD (coronary artery disease)  Hypertension  Ischemic heart disease  Dyslipidemia  Diabetes mellitus, type II, insulin dependent  OSA (obstructive sleep apnea)  CKD (chronic kidney disease) stage 3, GFR 30-59 ml/min    Signed, Azure Barrales Martinique MD,FACC 09/01/2012 2:59 PM

## 2012-09-01 NOTE — Plan of Care (Signed)
Problem: Undesirable Food Choices (NB-1.7) Goal: Nutrition education Formal process to instruct or train a patient/client in a skill or to impart knowledge to help patients/clients voluntarily manage or modify food choices and eating behavior to maintain or improve health.  Outcome: Completed/Met Date Met:  09/01/12 Nutrition Education Note  RD consulted for nutrition education regarding CHF and DM compliance.  RD provided "Low Sodium Nutrition Therapy" handout from the [employer]. Reviewed patient's dietary recall. Provided examples on ways to decrease sodium intake in diet. Discouraged intake of processed foods and use of salt shaker. Encouraged fresh fruits and vegetables as well as whole grain sources of carbohydrates to maximize fiber intake.   RD discussed why it is important for patient to adhere to diet recommendations, and emphasized the role of fluids, foods to avoid, and importance of weighing self daily.     Lab Results  Component Value Date    HGBA1C 7.0* 08/31/2012    RD provided "Carbohydrate Counting for People with Diabetes" handout from the Academy of [employer]etics. Discussed different food groups and their effects on blood sugar, emphasizing carbohydrate-containing foods. Provided list of carbohydrates and recommended serving sizes of common foods.  Discussed importance of controlled and consistent carbohydrate intake throughout the day. Provided examples of ways to balance meals/snacks and encouraged intake of high-fiber, whole grain complex carbohydrates.   Expect fair compliance.  Body mass index is 29.54 kg/(m^2). Pt meets criteria for overweight based on current BMI.  Current diet order is Carb Mod, patient is consuming approximately 100% of meals at this time. Labs and medications reviewed. No further nutrition interventions warranted at this time. RD contact information provided. If additional nutrition issues arise, please  re-consult RD.   Orson Slick RD, LDN Pager [phone] After Hours pager [phone]

## 2012-09-01 NOTE — Progress Notes (Signed)
UR Chart Review Completed  

## 2012-09-01 NOTE — Progress Notes (Signed)
Monitor tech notified RN that pt had 9 beat run of V-tach. BP 130/60 manual, HR 76. MD notified and no new orders given. Will continue to monitor and assess. Elyn Aquas A RN, 09/01/2012 4:47 AM

## 2012-09-01 NOTE — Progress Notes (Signed)
Pt HR in 50s this AM. 12.5mg  of coreg scheduled. MD notified and order to hold coreg this AM. Will continue to monitor and assess. Elyn Aquas A RN, 09/01/2012 6:36 AM

## 2012-09-01 NOTE — Progress Notes (Signed)
Echocardiogram 2D Echocardiogram has been performed.  Scott [name] 09/01/2012, 10:26 AM

## 2012-09-01 NOTE — Progress Notes (Signed)
09/01/12 0223  BiPAP/CPAP/SIPAP  BiPAP/CPAP/SIPAP Pt Type Adult  Mask Type Full face mask  Mask Size Medium  IPAP (IPAP Max=20)  EPAP (EPAP Min = 6)  Flow Rate 3 lpm  BiPAP/CPAP/SIPAP CPAP  Patient Home Equipment No  Auto Titrate Yes  BiPAP/CPAP /SiPAP Vitals  Bilateral Breath Sounds Clear;Diminished   Patient placed on CPAP with Full Face Mask and 3L oxygen bleed in. He can not remember his home settings, therefore I placed him on Auto Mode with a Max pressure of 20 and a min pressure of 6. He tolerates it very well at this time and he is instructed to call RT for any problem with the CPAP.

## 2012-09-01 NOTE — Progress Notes (Signed)
09/01/12 2200  BiPAP/CPAP/SIPAP  BiPAP/CPAP/SIPAP Pt Type Adult  Mask Type Full face mask  Mask Size Medium  IPAP (IPAP Max=20)  EPAP (EPAP Min=6)  Oxygen Percent 32 %  Flow Rate 3 lpm  BiPAP/CPAP/SIPAP CPAP  Patient Home Equipment No  Auto Titrate Yes   Patient placed on CPAP Full Face Mask with 3L oxygen bleed in. He is on Auto Mode and tolerates it very well.

## 2012-09-02 DIAGNOSIS — I5033 Acute on chronic diastolic (congestive) heart failure: Secondary | ICD-10-CM

## 2012-09-02 DIAGNOSIS — I509 Heart failure, unspecified: Secondary | ICD-10-CM

## 2012-09-02 LAB — URINE CULTURE: Colony Count: >=100000

## 2012-09-02 LAB — GLUCOSE, CAPILLARY
Glucose-Capillary: 111 mg/dL — ABNORMAL HIGH (ref 70–99)
Glucose-Capillary: 159 mg/dL — ABNORMAL HIGH (ref 70–99)
Glucose-Capillary: 123 mg/dL — ABNORMAL HIGH (ref 70–99)
Glucose-Capillary: 153 mg/dL — ABNORMAL HIGH (ref 70–99)

## 2012-09-02 LAB — BASIC METABOLIC PANEL
BUN: 28 mg/dL — ABNORMAL HIGH (ref 6–23)
CO2: 31 mEq/L (ref 19–32)
Calcium: 9.8 mg/dL (ref 8.4–10.5)
Chloride: 96 mEq/L (ref 96–112)
Creatinine, Ser: 1.41 mg/dL — ABNORMAL HIGH (ref 0.50–1.35)
GFR calc Af Amer: 54 mL/min — ABNORMAL LOW (ref >90)
GFR calc non Af Amer: 47 mL/min — ABNORMAL LOW (ref >90)
Glucose, Bld: 94 mg/dL (ref 70–99)
Potassium: 4.1 mEq/L (ref 3.5–5.1)
Sodium: 137 mEq/L (ref 135–145)

## 2012-09-02 MED ORDER — FUROSEMIDE 10 MG/ML IJ SOLN
40.0000 mg | Freq: Every day | INTRAMUSCULAR | Status: DC
  Administered 2012-09-03: 40 mg via INTRAVENOUS
  Filled 2012-09-02: qty 4

## 2012-09-02 MED ORDER — INSULIN ASPART 100 UNIT/ML ~~LOC~~ SOLN
5.0000 [IU] | Freq: Two times a day (BID) | SUBCUTANEOUS | Status: DC
  Administered 2012-09-02 – 2012-09-05 (×3): 5 [IU] via SUBCUTANEOUS

## 2012-09-02 MED ORDER — INSULIN NPH (HUMAN) (ISOPHANE) 100 UNIT/ML ~~LOC~~ SUSP
5.0000 [IU] | Freq: Two times a day (BID) | SUBCUTANEOUS | Status: DC
  Administered 2012-09-02 – 2012-09-04 (×3): 5 [IU] via SUBCUTANEOUS

## 2012-09-02 NOTE — Progress Notes (Signed)
Pt refuses to take insulin as ordered.  MD has been made aware.  Insulin orders have been adjusted by MD.  Will continue to monitor. Gae Gallop RN

## 2012-09-02 NOTE — Progress Notes (Signed)
PT has cpap machine set up in his room ready to go with 2 lpm o2 bleed in. PT wants to watch tv a little while longer but says he can put the machine on himself when he's ready for it. RT advised PT to call if he does need help. RT will continue to monitor.

## 2012-09-02 NOTE — Progress Notes (Signed)
Patient Name: Scott [name]      SUBJECTIVE: History of ischemic heart disease with prior bypass surgery stenting 2005/11 with echo EF 2/13 50- 55% evidence of pulmonary hypertension and severe biatrial enlargement. He been seen in recent weeks as an outpatient with congestive symptoms, increasing diuretics complicated by worsening renal insufficiency  Hence was admitted. Some propensity to bradycardia  With vigorous diuresis.  Echo yesterday demonstrates severe worsening of LV function now 25-30%. bereathing better, no chest pain  Past Medical History  Diagnosis Date  . Hypertension   . Ischemic heart disease     dates back to 1997 --   . Myocardial infarction     had CABG x3 -- left internal mammary to the LAD, vein graft to the posterior descending, and a vein graft to the posterior lateral branch  . Hyperkalemia   . Thrombocytopenia   . Shortness of breath   . Dyslipidemia   . Arthritis   . Gout   . CAD (coronary artery disease)   . Seasonal allergies   . Sinus trouble   . Sleep apnea   . Cataract   . CHF (congestive heart failure)   . Diabetes mellitus     insulin dependent  . Chronic kidney disease     renal insufficency  . GERD (gastroesophageal reflux disease)     PHYSICAL EXAM Filed Vitals:   09/01/12 1022 09/01/12 1456 09/01/12 2009 09/02/12 0558  BP: 130/66 149/69 137/60 135/51  Pulse: 53 54 55 57  Temp:  98.4 F (36.9 C) 98.2 F (36.8 C) 98.1 F (36.7 C)  TempSrc:  Oral Oral Oral  Resp: 18 17 18 18   Height:      Weight:    218 lb 7.6 oz (99.1 kg)  SpO2: 99% 99% 92% 94%    Well developed and nourished in no acute distress HENT normal Neck supple with JVP<10 Carotids brisk and full witht bruits Clear Regular rate and rhythm, no murmurs or gallops Abd-soft with active BS without hepatomegaly No Clubbing cyanosis edema Skin-warm and dry A & Oriented  Grossly normal sensory and motor function  ECG without change but QT very long  TELEMETRY:  Reviewed telemetry pt in sinus brady >55   Intake/Output Summary (Last 24 hours) at 09/02/12 1203 Last data filed at 09/02/12 0833  Gross per 24 hour  Intake    720 ml  Output   3150 ml  Net  -2430 ml    LABS: Basic Metabolic Panel:  Lab [id number] 09/01/12 0300 08/31/12 1525  NA 137 132* 139  K 4.1 4.1 4.9  CL 96 95* 101  CO2 31 29 28   GLUCOSE 94 123* 127*  BUN 28* 27* 25*  CREATININE 1.41* 1.27 1.20  CALCIUM 9.8 9.2 --  MG -- -- 1.7  PHOS -- -- --   Cardiac Enzymes:  Basename 09/01/12 0300 08/31/12 2034 08/31/12 1525  CKTOTAL -- -- --  CKMB -- -- --  CKMBINDEX -- -- --  TROPONINI <0.30 <0.30 <0.30   CBC:  Lab 08/31/12 1525  WBC 5.5  NEUTROABS 3.4  HGB 11.7*  HCT 36.7*  MCV 93.9  PLT 79*   PROTIME:  Basename 08/31/12 1525  LABPROT 15.8*  INR 1.29   Liver Function Tests:  Basename 08/31/12 1525  AST 29  ALT 18  ALKPHOS 185*  BILITOT 1.1  PROT 7.6  ALBUMIN 3.4*   No results found for this basename: LIPASE:2,AMYLASE:2 in the last 72 hours BNP: BNP (last  3 results)  Basename 08/31/12 1525 07/19/12 1028 01/18/12 1603  PROBNP 2864.0* 1116.0* 998.0*   D-Dimer: No results found for this basename: DDIMER:2 in the last 72 hours Hemoglobin A1C:  Basename 08/31/12 1525  HGBA1C 7.0*   Fasting Lipid Panel:  Basename 09/01/12 0300  CHOL 72  HDL 38*  LDLCALC 23  TRIG 55  CHOLHDL 1.9  LDLDIRECT --   Thyroid Function Tests:  Basename 08/31/12 1726  TSH 1.942  T4TOTAL --  T3FREE --  THYROIDAB --      ASSESSMENT AND PLAN:  Patient [facility] Problem List: Acute on chronic systolic heart failure (A999333)   Hypertension ()   Ischemic cardiomyopathy ()   Dyslipidemia ()   CAD (coronary artery disease) ()   Diabetes mellitus, type II, insulin dependent (04/07/2011)   OSA (obstructive sleep apnea) (07/04/2012)   CKD (chronic kidney disease) stage 3, GFR 30-59 ml/min (09/01/2012)  Long QT    We'll begin to back off on  diuretics a low bit as he will need to undergo catheterization and creatinine is starting to bump. Carvedilol has been initiated; he'll need ACE inhibitors but will defer until post catheterization       Signed, Virl Axe MD  09/02/2012

## 2012-09-03 LAB — GLUCOSE, CAPILLARY
Glucose-Capillary: 87 mg/dL (ref 70–99)
Glucose-Capillary: 158 mg/dL — ABNORMAL HIGH (ref 70–99)
Glucose-Capillary: 156 mg/dL — ABNORMAL HIGH (ref 70–99)

## 2012-09-03 LAB — BASIC METABOLIC PANEL
BUN: 30 mg/dL — ABNORMAL HIGH (ref 6–23)
CO2: 31 mEq/L (ref 19–32)
Calcium: 10.1 mg/dL (ref 8.4–10.5)
Chloride: 94 mEq/L — ABNORMAL LOW (ref 96–112)
Creatinine, Ser: 1.45 mg/dL — ABNORMAL HIGH (ref 0.50–1.35)
GFR calc Af Amer: 52 mL/min — ABNORMAL LOW (ref >90)
GFR calc non Af Amer: 45 mL/min — ABNORMAL LOW (ref >90)
Glucose, Bld: 95 mg/dL (ref 70–99)
Potassium: 4.1 mEq/L (ref 3.5–5.1)
Sodium: 136 mEq/L (ref 135–145)

## 2012-09-03 MED ORDER — CIPROFLOXACIN HCL 500 MG PO TABS
500.0000 mg | ORAL_TABLET | Freq: Two times a day (BID) | ORAL | Status: DC
  Administered 2012-09-03 – 2012-09-05 (×5): 500 mg via ORAL
  Filled 2012-09-03 (×7): qty 1

## 2012-09-03 MED ORDER — DIAZEPAM 5 MG PO TABS: 5.0000 mg | ORAL_TABLET | ORAL | Status: AC

## 2012-09-03 MED ORDER — SODIUM CHLORIDE 0.9 % IV SOLN
1.0000 mL/kg/h | INTRAVENOUS | Status: DC
  Administered 2012-09-04: 1 mL/kg/h via INTRAVENOUS

## 2012-09-03 MED ORDER — ASPIRIN 81 MG PO CHEW
324.0000 mg | CHEWABLE_TABLET | ORAL | Status: AC
  Administered 2012-09-04: 324 mg via ORAL
  Filled 2012-09-03: qty 4

## 2012-09-03 MED ORDER — SODIUM CHLORIDE 0.9 % IV SOLN: INTRAVENOUS | Status: DC

## 2012-09-03 NOTE — Progress Notes (Addendum)
Patient Name: Scott [name]      SUBJECTIVE: History of ischemic heart disease with prior bypass surgery stenting 2005/11 with echo EF 2/13 50- 55% evidence of pulmonary hypertension and severe biatrial enlargement. He been seen in recent weeks as an outpatient with congestive symptoms, increasing diuretics complicated by worsening renal insufficiency  Hence was admitted. Some propensity to bradycardia  With vigorous diuresis.  Echo yesterday demonstrates severe worsening of LV function now 25-30%. Plan Cath in am breathing better, no chest pain  Past Medical History  Diagnosis Date  . Hypertension   . Ischemic heart disease     dates back to 1997 --   . Myocardial infarction     had CABG x3 -- left internal mammary to the LAD, vein graft to the posterior descending, and a vein graft to the posterior lateral branch  . Hyperkalemia   . Thrombocytopenia   . Shortness of breath   . Dyslipidemia   . Arthritis   . Gout   . CAD (coronary artery disease)   . Seasonal allergies   . Sinus trouble   . Sleep apnea   . Cataract   . CHF (congestive heart failure)   . Diabetes mellitus     insulin dependent  . Chronic kidney disease     renal insufficency  . GERD (gastroesophageal reflux disease)     PHYSICAL EXAM Filed Vitals:   09/02/12 1505 09/02/12 1707 09/02/12 2030 09/03/12 0445  BP: 132/59 144/63 138/67 128/59  Pulse: 52 55 57 62  Temp: 98.5 F (36.9 C)  97.8 F (36.6 C) 98.2 F (36.8 C)  TempSrc: Oral  Oral Oral  Resp: 18  18 20   Height:      Weight:    203 lb 4.8 oz (92.216 kg)  SpO2: 97%  97% 96%    Well developed and nourished in no acute distress HENT normal Neck supple with JVP<10 Carotids brisk and full witht bruits Clear Regular rate and rhythm, no murmurs or gallops Abd-soft with active BS without hepatomegaly No Clubbing cyanosis edema Skin-warm and dry A & Oriented  Grossly normal sensory and motor function  ECG without change but QT very  long  TELEMETRY: Reviewed telemetry pt in sinus brady >55   Intake/Output Summary (Last 24 hours) at 09/03/12 1047 Last data filed at 09/03/12 0958  Gross per 24 hour  Intake    936 ml  Output   3975 ml  Net  -3039 ml    LABS: Basic Metabolic Panel:  Lab [id number] 0715 09/02/12 0535 09/01/12 0300 08/31/12 1525  NA 136 137 132* 139  K 4.1 4.1 4.1 4.9  CL 94* 96 95* 101  CO2 31 31 29 28   GLUCOSE 95 94 123* 127*  BUN 30* 28* 27* 25*  CREATININE 1.45* 1.41* 1.27 1.20  CALCIUM 10.1 9.8 -- --  MG -- -- -- 1.7  PHOS -- -- -- --   Cardiac Enzymes:  Basename 09/01/12 0300 08/31/12 2034 08/31/12 1525  CKTOTAL -- -- --  CKMB -- -- --  CKMBINDEX -- -- --  TROPONINI <0.30 <0.30 <0.30   CBC:  Lab 08/31/12 1525  WBC 5.5  NEUTROABS 3.4  HGB 11.7*  HCT 36.7*  MCV 93.9  PLT 79*   PROTIME:  Basename 08/31/12 1525  LABPROT 15.8*  INR 1.29   Liver Function Tests:  Basename 08/31/12 1525  AST 29  ALT 18  ALKPHOS 185*  BILITOT 1.1  PROT 7.6  ALBUMIN 3.4*  No results found for this basename: LIPASE:2,AMYLASE:2 in the last 72 hours BNP: BNP (last 3 results)  Basename 08/31/12 1525 07/19/12 1028 01/18/12 1603  PROBNP 2864.0* 1116.0* 998.0*   D-Dimer: No results found for this basename: DDIMER:2 in the last 72 hours Hemoglobin A1C:  Basename 08/31/12 1525  HGBA1C 7.0*   Fasting Lipid Panel:  Basename 09/01/12 0300  CHOL 72  HDL 38*  LDLCALC 23  TRIG 55  CHOLHDL 1.9  LDLDIRECT --   Thyroid Function Tests:  Basename 08/31/12 1726  TSH 1.942  T4TOTAL --  T3FREE --  THYROIDAB --      ASSESSMENT AND PLAN:  Patient [facility] Problem List: Acute on chronic systolic heart failure (A999333)   Hypertension ()   Ischemic cardiomyopathy ()   Dyslipidemia ()   CAD (coronary artery disease) ()   Diabetes mellitus, type II, insulin dependent (04/07/2011)   OSA (obstructive sleep apnea) (07/04/2012)   CKD (chronic kidney disease) stage 3,  GFR 30-59 ml/min (09/01/2012)  Long QT   +UA  ? UTI    Renal function still climbing  Will hold on diuretics this afternoon and in am>>NOTE THEY ARE D/Ced and will need to be resumed post cath;  Bradycardia stable  Begin cipro X 5d   anticipate Cors only       Signed, Virl Axe MD  09/03/2012

## 2012-09-03 NOTE — Progress Notes (Signed)
IV attempt 2x, unsuccessfully . IV team paged

## 2012-09-04 ENCOUNTER — Encounter (HOSPITAL_COMMUNITY)
Admission: AD | Disposition: A | Discharge status: Home / Self Care | Payer: Self-pay | Source: Ambulatory Visit | Attending: Cardiology

## 2012-09-04 DIAGNOSIS — I2510 Atherosclerotic heart disease of native coronary artery without angina pectoris: Secondary | ICD-10-CM

## 2012-09-04 HISTORY — PX: LEFT AND RIGHT HEART CATHETERIZATION WITH CORONARY/GRAFT ANGIOGRAM: SHX5448

## 2012-09-04 LAB — GLUCOSE, CAPILLARY
Glucose-Capillary: 85 mg/dL (ref 70–99)
Glucose-Capillary: 109 mg/dL — ABNORMAL HIGH (ref 70–99)
Glucose-Capillary: 119 mg/dL — ABNORMAL HIGH (ref 70–99)
Glucose-Capillary: 106 mg/dL — ABNORMAL HIGH (ref 70–99)
Glucose-Capillary: 160 mg/dL — ABNORMAL HIGH (ref 70–99)

## 2012-09-04 LAB — POCT I-STAT 3, VENOUS BLOOD GAS (G3P V)
Acid-Base Excess: 6 mmol/L — ABNORMAL HIGH (ref 0.0–2.0)
Bicarbonate: 31.8 mEq/L — ABNORMAL HIGH (ref 20.0–24.0)
O2 Saturation: 72 %
TCO2: 33 mmol/L (ref 0–100)
pCO2, Ven: 49.7 mmHg (ref 45.0–50.0)
pH, Ven: 7.413 — ABNORMAL HIGH (ref 7.250–7.300)
pO2, Ven: 38 mmHg (ref 30.0–45.0)

## 2012-09-04 LAB — CREATININE, SERUM
Creatinine, Ser: 1.31 mg/dL (ref 0.50–1.35)
GFR calc Af Amer: 59 mL/min — ABNORMAL LOW (ref >90)
GFR calc non Af Amer: 51 mL/min — ABNORMAL LOW (ref >90)

## 2012-09-04 LAB — CBC
HCT: 36.9 % — ABNORMAL LOW (ref 39.0–52.0)
HCT: 38.1 % — ABNORMAL LOW (ref 39.0–52.0)
Hemoglobin: 12 g/dL — ABNORMAL LOW (ref 13.0–17.0)
Hemoglobin: 12.1 g/dL — ABNORMAL LOW (ref 13.0–17.0)
MCH: 29.4 pg (ref 26.0–34.0)
MCH: 29.6 pg (ref 26.0–34.0)
MCHC: 32.5 g/dL (ref 30.0–36.0)
MCHC: 31.8 g/dL (ref 30.0–36.0)
MCV: 90.4 fL (ref 78.0–100.0)
MCV: 93.2 fL (ref 78.0–100.0)
Platelets: 98 10*3/uL — ABNORMAL LOW (ref 150–400)
Platelets: 110 10*3/uL — ABNORMAL LOW (ref 150–400)
RBC: 4.08 MIL/uL — ABNORMAL LOW (ref 4.22–5.81)
RBC: 4.09 MIL/uL — ABNORMAL LOW (ref 4.22–5.81)
RDW: 18.2 % — ABNORMAL HIGH (ref 11.5–15.5)
RDW: 18.4 % — ABNORMAL HIGH (ref 11.5–15.5)
WBC: 6.7 10*3/uL (ref 4.0–10.5)
WBC: 6.1 10*3/uL (ref 4.0–10.5)

## 2012-09-04 LAB — BASIC METABOLIC PANEL
BUN: 30 mg/dL — ABNORMAL HIGH (ref 6–23)
CO2: 30 mEq/L (ref 19–32)
Calcium: 9.8 mg/dL (ref 8.4–10.5)
Chloride: 95 mEq/L — ABNORMAL LOW (ref 96–112)
Creatinine, Ser: 1.33 mg/dL (ref 0.50–1.35)
GFR calc Af Amer: 58 mL/min — ABNORMAL LOW (ref >90)
GFR calc non Af Amer: 50 mL/min — ABNORMAL LOW (ref >90)
Glucose, Bld: 112 mg/dL — ABNORMAL HIGH (ref 70–99)
Potassium: 4.2 mEq/L (ref 3.5–5.1)
Sodium: 137 mEq/L (ref 135–145)

## 2012-09-04 SURGERY — LEFT AND RIGHT HEART CATHETERIZATION WITH CORONARY/GRAFT ANGIOGRAM

## 2012-09-04 MED ORDER — NITROGLYCERIN 0.2 MG/ML ON CALL CATH LAB
INTRAVENOUS | Status: AC
  Filled 2012-09-04: qty 1

## 2012-09-04 MED ORDER — HEPARIN (PORCINE) IN NACL 2-0.9 UNIT/ML-% IJ SOLN
INTRAMUSCULAR | Status: AC
  Filled 2012-09-04: qty 1500

## 2012-09-04 MED ORDER — MIDAZOLAM HCL 2 MG/2ML IJ SOLN
INTRAMUSCULAR | Status: AC
  Filled 2012-09-04: qty 2

## 2012-09-04 MED ORDER — LIDOCAINE HCL (PF) 1 % IJ SOLN
INTRAMUSCULAR | Status: AC
  Filled 2012-09-04: qty 30

## 2012-09-04 MED ORDER — FENTANYL CITRATE 0.05 MG/ML IJ SOLN
INTRAMUSCULAR | Status: AC
  Filled 2012-09-04: qty 2

## 2012-09-04 MED ORDER — ONDANSETRON HCL 4 MG/2ML IJ SOLN: 4.0000 mg | Freq: Four times a day (QID) | INTRAMUSCULAR | Status: DC | PRN

## 2012-09-04 MED ORDER — ACETAMINOPHEN 325 MG PO TABS: 650.0000 mg | ORAL_TABLET | ORAL | Status: DC | PRN

## 2012-09-04 MED ORDER — DIPHENHYDRAMINE HCL 50 MG/ML IJ SOLN
INTRAMUSCULAR | Status: AC
  Filled 2012-09-04: qty 1

## 2012-09-04 MED ORDER — SODIUM CHLORIDE 0.9 % IV SOLN: INTRAVENOUS | Status: AC

## 2012-09-04 NOTE — Plan of Care (Signed)
Problem: Phase I Progression Outcomes Goal: Dyspnea controlled at rest (HF) Outcome: Completed/Met Date Met:  09/04/12 Pt has not had any c/o SOB while at rest, goal met, O2 WNL Goal: Other Phase I Outcomes/Goals Outcome: Completed/Met Date Met:  09/04/12 Pt had cardiac cath today, pts right groin site level 0, remains on bedrest until 8pm, pt doing well, tolerating diet, will continue to monitor

## 2012-09-04 NOTE — CV Procedure (Signed)
   Cardiac Catheterization Procedure Note  Name: Scott [name] MRN: [id number] DOB: 02-12-1933  Procedure: Right Heart Cath, Left Heart Cath, Selective Coronary Angiography, saphenous vein graft angiography, LIMA angiography   Indication: CHF. 79 year-old patient with CAD s/p CABG. Now with reduced LV function and heart failure. Referred for right and left heart cath.   Procedural Details: The right groin was prepped, draped, and anesthetized with 1% lidocaine. Using the modified Seldinger technique a 5 French sheath was placed in the right femoral artery and a 7 French sheath was placed in the right femoral vein. A Swan-Ganz catheter was used for the right heart catheterization. Standard protocol was followed for recording of right heart pressures and sampling of oxygen saturations. Fick cardiac output was calculated. Standard Judkins catheters were used for selective coronary angiography and left ventriculography. There were no immediate procedural complications. The patient was transferred to the post catheterization recovery area for further monitoring.  Procedural Findings: Hemodynamics RA 13 RV 69/15 PA 64/21 mean 37 PCWP a wave 21 v wave 36, mean 21 LV 143/22 AO 143/60 mean 93  Oxygen saturations: PA 72 AO 91  Cardiac Output (Fick) 9.2  Cardiac Index (Fick) 4.3   Coronary angiography: Coronary dominance: right  Left mainstem: Left main is long. There is diffuse 50% mid left main stenosis into 75% distal left main.  Left anterior descending (LAD): The LAD is totally occluded in its proximal aspect.  Left circumflex (LCx): The left circumflex is patent there is diffuse 50% proximal stenosis. There are 2 patent obtuse marginal branches.  Right coronary artery (RCA): The RCA is heavily calcified and severely diseased. The mid vessel is totally occluded.  Saphenous vein graft to PDA: Widely patent with patent stents in the proximal body of the graft.  Saphenous vein graft to  PLA is patent there is mild in-stent restenosis in the proximal aspect of the graft but there is no significant disease noted. The PLA is a large branch with no significant stenosis  LIMA to LAD: Widely patent with no significant stenosis. The native vessel territory is more consistent with the diagonal territory than a true LAD and I suspect the native LAD is an anatomic variant the doesn't reach the left ventricular apex.  Left ventriculography: Deferred because of chronic kidney disease  Final Conclusions:   1. Severe three-vessel native coronary artery disease 2. Continued patency of the saphenous vein graft to PDA, saphenous vein graft to PLA, and LIMA to LAD 3. Moderate pulmonary hypertension likely related to left heart disease 4. Elevated left ventricular and pulmonary wedge pressure with large V waves  Recommendations: Continued medical therapy for treatment of congestive heart failure.   Sherren Mocha 09/04/2012, 3:25 PM

## 2012-09-04 NOTE — Progress Notes (Signed)
TELEMETRY: Reviewed telemetry pt in sinus brady, rate 50s.  Filed Vitals:   09/03/12 1548 09/03/12 2103 09/03/12 2324 09/04/12 0556  BP:  128/61  124/58  Pulse: 57 59 58 63  Temp:  98.1 F (36.7 C)  98.1 F (36.7 C)  TempSrc:  Oral  Oral  Resp:  18 16 18   Height:      Weight:    91.037 kg (200 lb 11.2 oz)  SpO2:  95% 96% 94%    Intake/Output Summary (Last 24 hours) at 09/04/12 0831 Last data filed at 09/04/12 0800  Gross per 24 hour  Intake    726 ml  Output   3025 ml  Net  -2299 ml    SUBJECTIVE Feels well. Diuresing well. No chest pain. Breathing much better.  LABS: Basic Metabolic Panel:  Basename 09/03/12 0715 09/02/12 0535  NA 136 137  K 4.1 4.1  CL 94* 96  CO2 31 31  GLUCOSE 95 94  BUN 30* 28*  CREATININE 1.45* 1.41*  CALCIUM 10.1 9.8  MG -- --  PHOS -- --   CBC:  Basename 09/04/12 0516  WBC 6.7  NEUTROABS --  HGB 12.0*  HCT 36.9*  MCV 90.4  PLT 98*   BNP: 2864  Radiology/Studies:  X-ray Chest Pa And Lateral  08/31/2012  *RADIOLOGY REPORT*  Clinical Data: Short of breath, CHF  CHEST - 2 VIEW  Comparison: Chest x-ray of 01/19/2012  Findings: Moderate cardiomegaly is present and there are somewhat prominent interstitial markings at the bases suggestive of mild interstitial edema.  No effusion is seen.  Median sternotomy sutures are noted from prior CABG.  No acute bony abnormality is seen.  IMPRESSION: Cardiomegaly.  Question mild interstitial edema   Original Report Authenticated By: Ivar Drape, M.D.    Echo: Study Conclusions  - Left ventricle: The cavity size was mildly dilated. Wall thickness was increased in a pattern of mild LVH. Systolic function was severely reduced. The estimated ejection fraction was in the range of 25% to 30%. Diffuse hypokinesis. Doppler parameters are consistent with restrictive physiology, indicative of decreased left ventricular diastolic compliance and/or increased left atrial pressure. Doppler parameters are  consistent with high ventricular filling pressure. - Aortic valve: Trivial regurgitation. - Mitral valve: Calcified annulus. Mild regurgitation. - Left atrium: The atrium was moderately dilated. - Right ventricle: The cavity size was mildly dilated. Systolic function was moderately reduced. - Right atrium: The atrium was moderately dilated. - Tricuspid valve: Moderate regurgitation. - Pulmonary arteries: Systolic pressure was severely increased. PA peak pressure: 18mm Hg (S).    PHYSICAL EXAM General: elderly, well nourished, in no acute distress. Head: Normocephalic, atraumatic, sclera non-icteric, no xanthomas, nares are without discharge. Neck: Negative for carotid bruits. JVD elevated to 4 cm Lungs: Clear bilaterally to auscultation without wheezes, rales, or rhonchi. Breathing is unlabored. Heart: RRR S1 S2 without murmurs, rubs, or gallops.  Abdomen: soft, nondistended. No rebound/guarding. No obvious abdominal masses. Msk:  Strength and tone appears normal for age. Extremities: trace edema.  Distal pedal pulses are 2+ and equal bilaterally. Neuro: Alert and oriented X 3. Moves all extremities spontaneously. Psych:  Responds to questions appropriately with a normal affect.  ASSESSMENT AND PLAN: 1. Acute on chronic systolic and diastolic dysfunction with severe right heart failure. Excellent response to diuresis with 21 lb weight loss since admission. Marked improvement in edema and ascites. Echo shows worsening of LV function since February. Now EF 25-30%. Continue coreg. Hold ACEi and ARB for  now. Lasix on hold for cath with gentle hydration. If creatinine improved today will proceed with right and left heart cath with coronary and graft angiography.  Continue sodium and fluid restriction. Dietary counseling. Consider adding ACEi/ARB when renal function stable post cath.  2. CAD s/p CABG 1997. S/p stents to SVGs- last in 2011. Enzymes negative.  3. CKD stage 3. Monitor with  diuresis  4. DM on insulin  5. HTN  6. Hyperlipidemia controlled on simvastatin.  7. UTI Ecoli. Now on Cipro.   Principal Problem:  *Acute on chronic systolic heart failure Active Problems:  CAD (coronary artery disease)  Hypertension  Ischemic cardiomyopathy  Dyslipidemia  Diabetes mellitus, type II, insulin dependent  OSA (obstructive sleep apnea)  CKD (chronic kidney disease) stage 3, GFR 30-59 ml/min    Signed, Naiah Donahoe [place] MD,FACC 09/04/2012 8:31 AM

## 2012-09-05 ENCOUNTER — Telehealth: Payer: Self-pay | Admitting: Cardiology

## 2012-09-05 ENCOUNTER — Encounter (HOSPITAL_COMMUNITY): Payer: Self-pay | Admitting: Cardiology

## 2012-09-05 DIAGNOSIS — I2589 Other forms of chronic ischemic heart disease: Secondary | ICD-10-CM

## 2012-09-05 DIAGNOSIS — I5043 Acute on chronic combined systolic (congestive) and diastolic (congestive) heart failure: Principal | ICD-10-CM

## 2012-09-05 LAB — POCT I-STAT 3, ART BLOOD GAS (G3+)
Acid-Base Excess: 4 mmol/L — ABNORMAL HIGH (ref 0.0–2.0)
Bicarbonate: 29.3 mEq/L — ABNORMAL HIGH (ref 20.0–24.0)
O2 Saturation: 91 %
TCO2: 31 mmol/L (ref 0–100)
pCO2 arterial: 46.7 mmHg — ABNORMAL HIGH (ref 35.0–45.0)
pH, Arterial: 7.406 (ref 7.350–7.450)
pO2, Arterial: 61 mmHg — ABNORMAL LOW (ref 80.0–100.0)

## 2012-09-05 LAB — BASIC METABOLIC PANEL
BUN: 27 mg/dL — ABNORMAL HIGH (ref 6–23)
CO2: 28 mEq/L (ref 19–32)
Calcium: 9.5 mg/dL (ref 8.4–10.5)
Chloride: 99 mEq/L (ref 96–112)
Creatinine, Ser: 1.16 mg/dL (ref 0.50–1.35)
GFR calc Af Amer: 68 mL/min — ABNORMAL LOW (ref >90)
GFR calc non Af Amer: 59 mL/min — ABNORMAL LOW (ref >90)
Glucose, Bld: 114 mg/dL — ABNORMAL HIGH (ref 70–99)
Potassium: 4.2 mEq/L (ref 3.5–5.1)
Sodium: 135 mEq/L (ref 135–145)

## 2012-09-05 LAB — GLUCOSE, CAPILLARY: Glucose-Capillary: 110 mg/dL — ABNORMAL HIGH (ref 70–99)

## 2012-09-05 MED ORDER — CIPROFLOXACIN HCL 500 MG PO TABS: 500.0000 mg | ORAL_TABLET | Freq: Two times a day (BID) | ORAL | Status: AC

## 2012-09-05 MED ORDER — PANTOPRAZOLE SODIUM 40 MG PO TBEC: 40.0000 mg | DELAYED_RELEASE_TABLET | Freq: Every day | ORAL | Status: DC

## 2012-09-05 MED ORDER — NITROGLYCERIN 0.4 MG SL SUBL: 0.4000 mg | SUBLINGUAL_TABLET | SUBLINGUAL | Status: DC | PRN

## 2012-09-05 MED ORDER — FUROSEMIDE 40 MG PO TABS
40.0000 mg | ORAL_TABLET | Freq: Two times a day (BID) | ORAL | Status: DC
  Administered 2012-09-05: 40 mg via ORAL
  Filled 2012-09-05 (×3): qty 1

## 2012-09-05 MED ORDER — FUROSEMIDE 40 MG PO TABS: 40.0000 mg | ORAL_TABLET | Freq: Two times a day (BID) | ORAL | Status: DC

## 2012-09-05 MED ORDER — ALLOPURINOL 100 MG PO TABS: 200.0000 mg | ORAL_TABLET | Freq: Every day | ORAL | Status: DC

## 2012-09-05 MED ORDER — LISINOPRIL 5 MG PO TABS: 5.0000 mg | ORAL_TABLET | Freq: Every day | ORAL | Status: DC

## 2012-09-05 MED ORDER — LISINOPRIL 5 MG PO TABS
5.0000 mg | ORAL_TABLET | Freq: Every day | ORAL | Status: DC
  Administered 2012-09-05: 5 mg via ORAL
  Filled 2012-09-05: qty 1

## 2012-09-05 MED ORDER — CARVEDILOL 12.5 MG PO TABS: 12.5000 mg | ORAL_TABLET | Freq: Two times a day (BID) | ORAL | Status: DC

## 2012-09-05 NOTE — Telephone Encounter (Signed)
Pt. states that he is doing well since hosp. d/c, he is going to receive all his medications today from pharmacy and is aware of scheduled appt. with Truitt Merle, NP on 09/12/12 at 8:30 am.  Pt. given office phone number and asked to call if he has any problems, he verbalized understanding.

## 2012-09-05 NOTE — Progress Notes (Signed)
TELEMETRY: Reviewed telemetry pt in sinus brady, rate 50s.  Filed Vitals:   09/04/12 2001 09/04/12 2137 09/05/12 0143 09/05/12 0545  BP: 134/61 125/57 124/60 129/70  Pulse: 61 57 60 60  Temp:  97.8 F (36.6 C) 98 F (36.7 C) 97.7 F (36.5 C)  TempSrc:  Oral Oral Oral  Resp:  18 18 16   Height:      Weight:    90.674 kg (199 lb 14.4 oz)  SpO2:  93% 93%     Intake/Output Summary (Last 24 hours) at 09/05/12 0908 Last data filed at 09/05/12 0854  Gross per 24 hour  Intake   1190 ml  Output   1725 ml  Net   -535 ml    SUBJECTIVE Feels well. Diuresing well. No chest pain. Breathing much better.  LABS: Basic Metabolic Panel:  Basename 09/05/12 0545 09/04/12 1655 09/04/12 0516  NA 135 -- 137  K 4.2 -- 4.2  CL 99 -- 95*  CO2 28 -- 30  GLUCOSE 114* -- 112*  BUN 27* -- 30*  CREATININE 1.16 1.31 --  CALCIUM 9.5 -- 9.8  MG -- -- --  PHOS -- -- --   CBC:  Basename 09/04/12 1655 09/04/12 0516  WBC 6.1 6.7  NEUTROABS -- --  HGB 12.1* 12.0*  HCT 38.1* 36.9*  MCV 93.2 90.4  PLT 110* 98*   BNP: 2864  Radiology/Studies:  X-ray Chest Pa And Lateral  08/31/2012  *RADIOLOGY REPORT*  Clinical Data: Short of breath, CHF  CHEST - 2 VIEW  Comparison: Chest x-ray of 01/19/2012  Findings: Moderate cardiomegaly is present and there are somewhat prominent interstitial markings at the bases suggestive of mild interstitial edema.  No effusion is seen.  Median sternotomy sutures are noted from prior CABG.  No acute bony abnormality is seen.  IMPRESSION: Cardiomegaly.  Question mild interstitial edema   Original Report Authenticated By: Ivar Drape, M.D.    Echo: Study Conclusions  - Left ventricle: The cavity size was mildly dilated. Wall thickness was increased in a pattern of mild LVH. Systolic function was severely reduced. The estimated ejection fraction was in the range of 25% to 30%. Diffuse hypokinesis. Doppler parameters are consistent with restrictive physiology,  indicative of decreased left ventricular diastolic compliance and/or increased left atrial pressure. Doppler parameters are consistent with high ventricular filling pressure. - Aortic valve: Trivial regurgitation. - Mitral valve: Calcified annulus. Mild regurgitation. - Left atrium: The atrium was moderately dilated. - Right ventricle: The cavity size was mildly dilated. Systolic function was moderately reduced. - Right atrium: The atrium was moderately dilated. - Tricuspid valve: Moderate regurgitation. - Pulmonary arteries: Systolic pressure was severely increased. PA peak pressure: 56mm Hg (S).  Cardiac Catheterization Procedure Note  Name: Scott [name]  MRN: [id number]  DOB: 1933/03/01  Procedure: Right Heart Cath, Left Heart Cath, Selective Coronary Angiography, saphenous vein graft angiography, LIMA angiography  Indication: CHF. 78 year-old patient with CAD s/p CABG. Now with reduced LV function and heart failure. Referred for right and left heart cath.  Procedural Details: The right groin was prepped, draped, and anesthetized with 1% lidocaine. Using the modified Seldinger technique a 5 French sheath was placed in the right femoral artery and a 7 French sheath was placed in the right femoral vein. A Swan-Ganz catheter was used for the right heart catheterization. Standard protocol was followed for recording of right heart pressures and sampling of oxygen saturations. Fick cardiac output was calculated. Standard Judkins catheters were used for  selective coronary angiography and left ventriculography. There were no immediate procedural complications. The patient was transferred to the post catheterization recovery area for further monitoring.  Procedural Findings:  Hemodynamics  RA 13  RV 69/15  PA 64/21 mean 37  PCWP a wave 21 v wave 36, mean 21  LV 143/22  AO 143/60 mean 93  Oxygen saturations:  PA 72  AO 91  Cardiac Output (Fick) 9.2  Cardiac Index (Fick) 4.3  Coronary  angiography:  Coronary dominance: right  Left mainstem: Left main is long. There is diffuse 50% mid left main stenosis into 75% distal left main.  Left anterior descending (LAD): The LAD is totally occluded in its proximal aspect.  Left circumflex (LCx): The left circumflex is patent there is diffuse 50% proximal stenosis. There are 2 patent obtuse marginal branches.  Right coronary artery (RCA): The RCA is heavily calcified and severely diseased. The mid vessel is totally occluded.  Saphenous vein graft to PDA: Widely patent with patent stents in the proximal body of the graft.  Saphenous vein graft to PLA is patent there is mild in-stent restenosis in the proximal aspect of the graft but there is no significant disease noted. The PLA is a large branch with no significant stenosis  LIMA to LAD: Widely patent with no significant stenosis. The native vessel territory is more consistent with the diagonal territory than a true LAD and I suspect the native LAD is an anatomic variant the doesn't reach the left ventricular apex.  Left ventriculography: Deferred because of chronic kidney disease  Final Conclusions:  1. Severe three-vessel native coronary artery disease  2. Continued patency of the saphenous vein graft to PDA, saphenous vein graft to PLA, and LIMA to LAD  3. Moderate pulmonary hypertension likely related to left heart disease  4. Elevated left ventricular and pulmonary wedge pressure with large V waves  Recommendations: Continued medical therapy for treatment of congestive heart failure.  Sherren Mocha  09/04/2012, 3:25 PM   PHYSICAL EXAM General: elderly, well nourished, in no acute distress. Head: Normocephalic, atraumatic, sclera non-icteric, no xanthomas, nares are without discharge. Neck: Negative for carotid bruits. JVD elevated to 4 cm Lungs: Clear bilaterally to auscultation without wheezes, rales, or rhonchi. Breathing is unlabored. Heart: RRR S1 S2 without murmurs, rubs,  or gallops.  Abdomen: soft, nondistended. No rebound/guarding. No obvious abdominal masses. Msk:  Strength and tone appears normal for age. Extremities: trace edema.  Distal pedal pulses are 2+ and equal bilaterally. Neuro: Alert and oriented X 3. Moves all extremities spontaneously. Psych:  Responds to questions appropriately with a normal affect.  ASSESSMENT AND PLAN: 1. Acute on chronic systolic and diastolic dysfunction with severe right heart failure. Excellent response to diuresis with 22 lb weight loss since admission. Marked improvement in edema and ascites. Echo shows worsening of LV function since February. Now EF 25-30%. Continue coreg. Resume oral Lasix and low dose ACEi. Continue sodium and fluid restriction. Dietary counseling.  Needs to avoid NSAIDS and weigh daily. Plan discharge today with close follow up in our office.  2. CAD s/p CABG 1997. S/p stents to SVGs- last in 2011. Enzymes negative. Grafts patent.  3. CKD stage 3. Creatinine improved.  4. DM on insulin  5. HTN  6. Hyperlipidemia controlled on simvastatin.  7. UTI Ecoli. Now on Cipro.  8. Gout. Patient was on 600 mg Allopurinol per [place]. Given renal function this was reduced to 200 mg. Use colchicine for acute flairs.   Principal Problem:  *  Acute on chronic systolic heart failure Active Problems:  CAD (coronary artery disease)  Hypertension  Ischemic cardiomyopathy  Dyslipidemia  Diabetes mellitus, type II, insulin dependent  OSA (obstructive sleep apnea)  CKD (chronic kidney disease) stage 3, GFR 30-59 ml/min    Signed, Peter Martin[place] MD,FACC 09/05/2012 9:08 AM

## 2012-09-05 NOTE — Discharge Summary (Signed)
Discharge Summary   Patient ID: Scott [name] MRN: [id number], DOB/AGE: 1932-11-08 78 y.o.  Primary MD: Hilbert Corrigan, MD Primary Cardiologist: Dr. Martinique Admit date: 08/31/2012 D/C date:     09/05/2012      Primary Discharge Diagnoses:  1. Acute on chronic systolic and diastolic dysfunction with severe Right Heart Failure  - Diuresed 22lbs (221--> 199lbs)  - Echo showed EF 25-30%, mild MR, mod TR, mod R/LAE, mod reduced RV systolic fxn, PASP [id number]  - Dc'd on Lasix 40mg  BID, Coreg 12.5mg  BID, Lisinopril 5mg ,   2. Coronary Artery Disease  - Cath showed continued graft patency, mod pulm htn, elevated LV & pulm wedge pressure  3. CKD, Stage 3  - Encouraged to avoid NSAIDs   - F/u BMET  4. Gout  - Reduce Allopurinol from 600mg  to 200mg  daily  5. E.Coli UTI  - Cipro for 10 days (through 12/3)  - F/u PCP  6. Dietary Noncompliance  - Encouraged to limit fast food restaurants and sodium intake   Secondary Discharge Diagnoses:  Past Medical History  Diagnosis Date  . Hypertension   . Hyperkalemia   . Thrombocytopenia   . Dyslipidemia   . Arthritis   . Gout   . CAD (coronary artery disease)     3v CABG 1997; PCI of SVG to PL '05; PCI SVG to PL & SVG to PDA '11  . Sleep apnea   . Cataract   . Diabetes mellitus     insulin dependent  . GERD (gastroesophageal reflux disease)     Allergies No Known Allergies  Diagnostic Studies/Procedures:   09/01/12 - Echo Study Conclusions: - Left ventricle: The cavity size was mildly dilated. Wall thickness was increased in a pattern of mild LVH. Systolic function was severely reduced. The estimated ejection fraction was in the range of 25% to 30%. Diffuse hypokinesis. Doppler parameters are consistent with restrictive physiology, indicative of decreased left ventricular diastolic compliance and/or increased left atrial pressure. Doppler parameters are consistent with high ventricular filling pressure. - Aortic valve: Trivial  regurgitation. - Mitral valve: Calcified annulus. Mild regurgitation. - Left atrium: The atrium was moderately dilated. - Right ventricle: The cavity size was mildly dilated. Systolic function was moderately reduced. - Right atrium: The atrium was moderately dilated. - Tricuspid valve: Moderate regurgitation. - Pulmonary arteries: Systolic pressure was severely increased. PA peak pressure: 24mm Hg (S).  09/04/12 - Right/Left Heart Cath Hemodynamics  RA 13  RV 69/15  PA 64/21 mean 37  PCWP a wave 21 v wave 36, mean 21  LV 143/22  AO 143/60 mean 93  Oxygen saturations:  PA 72  AO 91  Cardiac Output (Fick) 9.2  Cardiac Index (Fick) 4.3  Coronary angiography:  Coronary dominance: right  Left mainstem: Left main is long. There is diffuse 50% mid left main stenosis into 75% distal left main.  Left anterior descending (LAD): The LAD is totally occluded in its proximal aspect.  Left circumflex (LCx): The left circumflex is patent there is diffuse 50% proximal stenosis. There are 2 patent obtuse marginal branches.  Right coronary artery (RCA): The RCA is heavily calcified and severely diseased. The mid vessel is totally occluded.  Saphenous vein graft to PDA: Widely patent with patent stents in the proximal body of the graft.  Saphenous vein graft to PLA is patent there is mild in-stent restenosis in the proximal aspect of the graft but there is no significant disease noted. The PLA is a large branch with no  significant stenosis  LIMA to LAD: Widely patent with no significant stenosis. The native vessel territory is more consistent with the diagonal territory than a true LAD and I suspect the native LAD is an anatomic variant the doesn't reach the left ventricular apex.  Left ventriculography: Deferred because of chronic kidney disease  Final Conclusions:  1. Severe three-vessel native coronary artery disease  2. Continued patency of the saphenous vein graft to PDA, saphenous vein graft to  PLA, and LIMA to LAD  3. Moderate pulmonary hypertension likely related to left heart disease  4. Elevated left ventricular and pulmonary wedge pressure with large V waves  Recommendations: Continued medical therapy for treatment of congestive heart failure.   History of Present Illness: 78 y.o. male w/ the above medical problems who was seen in clinic on 08/31/12 for weight gain, edema, and shortness of breath for which he was admitted to [facility] for further evaluation and treatment.   Hospital Course: At [facility] he was noted to be markedly volume overloaded with anasarca. EKG revealed marked sinus bradycardia 49bpm 1st degree AVB, ST/T wave changes consistent with inferior lateral ischemia. CXR showed cardiomegaly and question mild interstitial edema. Cardiac enzymes were cycled and remained negative.  He was diuresed with IV lasix with improvement in volume status and symptoms. Diuresed a total of 22lbs. Labetalol was switched to Coreg with close monitoring for bradycardia. Echo showed EF 25-30%, mild MR, mod TR, mod R/LAE, mod reduced RV systolic fxn, PASP [id number]. Given reduced EF he underwent right/left heart cath which revealed continued graft patency, mod pulm htn, elevated LV & pulm wedge pressure. He tolerated the procedure well without complications. He received extensive education regarding low sodium diet and fluid restriction as well as avoidance of NSAIDs and reduction of Allopurinol. UCx showed E.Coli UTI for which he was started on Cipro on 11/24 with plans to complete 10 day course. He was seen and evaluated by Dr. Martinique who felt he was stable for discharge home with plans for follow up as scheduled below.  Discharge Vitals: Blood pressure 129/70, pulse 60, temperature 97.7 F (36.5 C), temperature source Oral, resp. rate 16, height 6' (1.829 m), weight 199 lb 14.4 oz (90.674 kg), SpO2 93.00%.  Labs: Component Value Date   WBC 6.1 09/04/2012   HGB 12.1* 09/04/2012    HCT 38.1* 09/04/2012   MCV 93.2 09/04/2012   PLT 110* 09/04/2012    Lab 09/05/12 0545 08/31/12 1525  NA 135 --  K 4.2 --  CL 99 --  CO2 28 --  BUN 27* --  CREATININE 1.16 --  CALCIUM 9.5 --  PROT -- 7.6  BILITOT -- 1.1  ALKPHOS -- 185*  ALT -- 18  AST -- 29  GLUCOSE 114* --   Component Value Date   CHOL 72 09/01/2012   HDL 38* 09/01/2012   LDLCALC 23 09/01/2012   TRIG 55 09/01/2012    08/31/2012 15:25 08/31/2012 20:34 09/01/2012 03:00  Troponin I <0.30 <0.30 <0.30     08/31/2012 15:25  Pro B Natriuretic peptide (BNP) 2864.0 (H)     08/31/2012 15:25  Hemoglobin A1C 7.0 (H)     08/31/2012 17:26  TSH 1.942    Discharge Medications     Medication List     As of 09/05/2012 11:32 AM    STOP taking these medications         labetalol 200 MG tablet   Commonly known as: NORMODYNE   Replaced by: carvedilol 12.5  MG tablet      omeprazole 20 MG capsule   Commonly known as: PRILOSEC   Replaced by: pantoprazole 40 MG tablet      TAKE these medications         allopurinol 100 MG tablet   Commonly known as: ZYLOPRIM   Take 2 tablets (200 mg total) by mouth daily.      aspirin 81 MG EC tablet   Take 81 mg by mouth daily.      carvedilol 12.5 MG tablet   Commonly known as: COREG   Take 1 tablet (12.5 mg total) by mouth 2 (two) times daily with a meal.      ciprofloxacin 500 MG tablet   Commonly known as: CIPRO   Take 1 tablet (500 mg total) by mouth 2 (two) times daily.      clopidogrel 75 MG tablet   Commonly known as: PLAVIX   Take 75 mg by mouth daily.      colchicine 0.6 MG tablet   Take 0.6 mg by mouth 3 (three) times daily as needed. For gout pain      famotidine 20 MG tablet   Commonly known as: PEPCID   Take 20 mg by mouth at bedtime.      furosemide 40 MG tablet   Commonly known as: LASIX   Take 1 tablet (40 mg total) by mouth 2 (two) times daily.      insulin NPH 100 UNIT/ML injection   Commonly known as: HUMULIN N,NOVOLIN N   Inject  15 Units into the skin 2 (two) times daily before a meal.      insulin regular 100 units/mL injection   Commonly known as: NOVOLIN R,HUMULIN R   Inject 10 Units into the skin 2 (two) times daily before a meal.      lisinopril 5 MG tablet   Commonly known as: PRINIVIL,ZESTRIL   Take 1 tablet (5 mg total) by mouth daily.      multivitamin tablet   Take 1 tablet by mouth daily.      nitroGLYCERIN 0.4 MG SL tablet   Commonly known as: NITROSTAT   Place 1 tablet (0.4 mg total) under the tongue every 5 (five) minutes as needed for chest pain (up to 3 doses).      pantoprazole 40 MG tablet   Commonly known as: PROTONIX   Take 1 tablet (40 mg total) by mouth daily.      simvastatin 20 MG tablet   Commonly known as: ZOCOR   Take 10 mg by mouth at bedtime.      VITAMIN B-12 PO   Take 1 tablet by mouth daily.          Disposition   Discharge Orders    Future Appointments: Provider: Department: Dept Phone: Center:   09/12/2012 8:30 AM Burtis Junes, NP [facility]) [phone] [id number]   02/22/2013 11:30 AM Kathee Delton, MD [facility] [phone] None     Future Orders Please Complete By Expires   Diet - low sodium heart healthy      Increase activity slowly      Discharge instructions      Comments:   * Please take all medications as prescribed and bring them with you to your office visits. Please bring discharge paperwork with you to the pharmacy to pick up your medications. * Please avoid all NSAIDs (diclofenac, ibuprofen, aleve, etc). * You were started on antibiotics (Cipro) for a urinary  tract infection. Please finish antibiotics and follow up with your primary care provider. * Your Prilosec was changed to Protonix due to its interactions with Plavix.     Follow-up Information    Follow up with Truitt Merle, NP. On 09/12/2012. (8:30)    Contact information:   [facility]. [address] [place]  [address] [phone]           Outstanding Labs/Studies:  BMET  Duration of Discharge Encounter: Greater than 30 minutes including physician and PA time.  Signed, Caprina Wussow PA-C 09/05/2012, 11:32 AM

## 2012-09-05 NOTE — Plan of Care (Signed)
Problem: Phase I Progression Outcomes Goal: Hemodynamically stable Outcome: Completed/Met Date Met:  09/05/12 Pt stable, will be d/c to home, goal met  Problem: Phase II Progression Outcomes Goal: Dyspnea controlled with activity Outcome: Completed/Met Date Met:  09/05/12 Pt oob ad lib, no c/o sob, pt wnl on room air, goal met Goal: Discharge plan established Outcome: Completed/Met Date Met:  09/05/12 Pt being d/c to home, d/c instructions, follow up appointments, and meds to be given to pt,   Problem: Phase III Progression Outcomes Goal: Activity at appropriate level-compared to baseline (UP IN CHAIR FOR HEMODIALYSIS)  Outcome: Completed/Met Date Met:  09/05/12 Pt oob ad lib independently which is baseline, goal met Goal: Dyspnea controlled with activity Outcome: Completed/Met Date Met:  09/05/12 No c/o dyspnea goal met Goal: Discharge plan remains appropriate-arrangements made Outcome: Completed/Met Date Met:  09/05/12 Pt being d/c to home, goal met

## 2012-09-05 NOTE — Telephone Encounter (Signed)
Transitional care pt per [facility] call

## 2012-09-06 NOTE — Discharge Summary (Signed)
Patient seen and examined and history reviewed. Agree with above findings and plan. See earlier rounding note.  Collier Salina [facility] 09/06/2012 9:11 AM

## 2012-09-12 ENCOUNTER — Encounter: Payer: Self-pay | Admitting: Nurse Practitioner

## 2012-09-12 ENCOUNTER — Ambulatory Visit (INDEPENDENT_AMBULATORY_CARE_PROVIDER_SITE_OTHER): Payer: Medicare Other | Admitting: Nurse Practitioner

## 2012-09-12 VITALS — BP 142/60 | HR 54 | Ht 72.0 in | Wt 205.2 lb

## 2012-09-12 DIAGNOSIS — I255 Ischemic cardiomyopathy: Secondary | ICD-10-CM

## 2012-09-12 LAB — BASIC METABOLIC PANEL
BUN: 38 mg/dL — ABNORMAL HIGH (ref 6–23)
CO2: 25 mEq/L (ref 19–32)
Calcium: 8.8 mg/dL (ref 8.4–10.5)
Chloride: 102 mEq/L (ref 96–112)
Creatinine, Ser: 1.4 mg/dL (ref 0.4–1.5)
GFR: 52.14 mL/min — ABNORMAL LOW (ref >60.00)
Glucose, Bld: 143 mg/dL — ABNORMAL HIGH (ref 70–99)
Potassium: 4.4 mEq/L (ref 3.5–5.1)
Sodium: 134 mEq/L — ABNORMAL LOW (ref 135–145)

## 2012-09-12 NOTE — Progress Notes (Signed)
Scott [name] Date of Birth: 05-06-1933 Medical Record [id number]  History of Present Illness: Scott [name] is seen back today for a post hospital visit. He is seen for Dr. Martinique. He has multiple issues which include chronic systolic and diastolic heart failure, HTN, HLD, insulin dependent DM, GERD, CAD with past CABG, stage 3 CKD, gout, UTI and dietary noncompliance.   He has most recently been admitted with severe right heart failure. He was diuresed 22 pounds. Echo showed EF 25 to 30% with mild RM, mod TR, mod RAE & LAE, moderate reduced RV systolic function and PASP of 49mmHg. He did undergo cardiac cath which showed his grafts to be patent, moderate pulmonary HTN, elevated LV and pulmonary wedge pressures.  He comes in today. He is here alone. He says he is doing great! His weight is staying at 200 at home. He is only using 40 mg of Lasix a day and not the BID dose that was advised on discharge. Not short of breath. Belly feels better. Less swelling. He is happy with how he feels. He is trying to use less salt. Not drinking V8 anymore.   Current Outpatient Prescriptions on File Prior to Visit  Medication Sig Dispense Refill  . allopurinol (ZYLOPRIM) 100 MG tablet Take 2 tablets (200 mg total) by mouth daily.  60 tablet  1  . aspirin 81 MG EC tablet Take 81 mg by mouth daily.        . carvedilol (COREG) 12.5 MG tablet Take 1 tablet (12.5 mg total) by mouth 2 (two) times daily with a meal.  60 tablet  6  . ciprofloxacin (CIPRO) 500 MG tablet Take 1 tablet (500 mg total) by mouth 2 (two) times daily.  15 tablet  0  . clopidogrel (PLAVIX) 75 MG tablet Take 75 mg by mouth daily.      . colchicine 0.6 MG tablet Take 0.6 mg by mouth 3 (three) times daily as needed. For gout pain      . Cyanocobalamin (VITAMIN B-12 PO) Take 1 tablet by mouth daily.      . famotidine (PEPCID) 20 MG tablet Take 20 mg by mouth at bedtime.      . furosemide (LASIX) 40 MG tablet Take 1 tablet (40 mg total) by mouth 2 (two)  times daily.  60 tablet  6  . insulin NPH (HUMULIN N,NOVOLIN N) 100 UNIT/ML injection Inject 15 Units into the skin 2 (two) times daily before a meal.      . insulin regular (NOVOLIN R,HUMULIN R) 100 units/mL injection Inject 10 Units into the skin 2 (two) times daily before a meal.      . lisinopril (PRINIVIL,ZESTRIL) 5 MG tablet Take 1 tablet (5 mg total) by mouth daily.  30 tablet  6  . Multiple Vitamin (MULTIVITAMIN) tablet Take 1 tablet by mouth daily.        . nitroGLYCERIN (NITROSTAT) 0.4 MG SL tablet Place 1 tablet (0.4 mg total) under the tongue every 5 (five) minutes as needed for chest pain (up to 3 doses).  25 tablet  3  . pantoprazole (PROTONIX) 40 MG tablet Take 1 tablet (40 mg total) by mouth daily.  30 tablet  2  . simvastatin (ZOCOR) 20 MG tablet Take 10 mg by mouth at bedtime.          No Known Allergies  Past Medical History  Diagnosis Date  . Hypertension   . Hyperkalemia   . Thrombocytopenia   . Dyslipidemia   .  Arthritis   . Gout   . CAD (coronary artery disease)     3v CABG 1997; PCI of SVG to PL '05; PCI SVG to PL & SVG to PDA '11; Cath 08/2012 patent grafts  . Sleep apnea   . Cataract   . Chronic combined systolic and diastolic CHF (congestive heart failure)     EF 50-55% 11/2011; EF 25-30% 08/2012  . Diabetes mellitus     insulin dependent  . Chronic kidney disease     Stage3  . GERD (gastroesophageal reflux disease)   . Gout   . UTI (lower urinary tract infection) 08/2012    E.coli    Past Surgical History  Procedure Date  . Coronary artery bypass graft 1997     (LIMA to LAD, SVG to PDA, SVG to PL)  . Cardiac catheterization 2005    stenting of the vein graft to the posterior lateral per -- Dr. Martinique      . Cardiac catheterization 2011    showed right coronary, totally occluded LAD and severe stenosis in both saphenous vein graft to the posterior lateral and posterior descending  -- stenting of the proximal portion of the SVG to the posterior  lateral branch in 2/11  . Appendectomy   . Cholecystectomy   . Back surgery     15 years ago  . Colon surgery     cancerous polyps    History  Smoking status  . Former Smoker -- 1.5 packs/day for 35 years  . Types: Cigarettes  . Quit date: 10/12/1983  Smokeless tobacco  . Never Used    History  Alcohol Use  . Yes    Comment: social only    Family History  Problem Relation Age of Onset  . Coronary artery disease Father   . Heart attack Father   . Coronary artery disease Brother     1/2 brother   . Kidney disease Sister   . Cardiomyopathy Mother   . Asthma Brother     Review of Systems: The review of systems is per the HPI.  All other systems were reviewed and are negative.  Physical Exam: BP 142/60  Pulse 54  Ht 6' (1.829 m)  Wt 205 lb 3.2 oz (93.078 kg)  BMI 27.83 kg/m2 Weight remains down 20 pounds. Patient is very pleasant and in no acute distress. Skin is warm and dry. Color is normal.  HEENT is unremarkable. Normocephalic/atraumatic. PERRL. Sclera are nonicteric. Neck is supple. No masses. No JVD. Lungs are clear. Cardiac exam shows a regular rate and rhythm. Abdomen is soft. Extremities are with just trace edema. Gait and ROM are intact. No gross neurologic deficits noted.   LABORATORY DATA: BMET is pending.   Lab Results  Component Value Date   WBC 6.1 09/04/2012   HGB 12.1* 09/04/2012   HCT 38.1* 09/04/2012   PLT 110* 09/04/2012   GLUCOSE 114* 09/05/2012   CHOL 72 09/01/2012   TRIG 55 09/01/2012   HDL 38* 09/01/2012   LDLCALC 23 09/01/2012   ALT 18 08/31/2012   AST 29 08/31/2012   NA 135 09/05/2012   K 4.2 09/05/2012   CL 99 09/05/2012   CREATININE 1.16 09/05/2012   BUN 27* 09/05/2012   CO2 28 09/05/2012   TSH 1.942 08/31/2012   INR 1.29 08/31/2012   HGBA1C 7.0* 08/31/2012   Echo Study Conclusions  - Left ventricle: The cavity size was mildly dilated. Wall thickness was increased in a pattern of mild LVH. Systolic  function was  severely reduced. The estimated ejection fraction was in the range of 25% to 30%. Diffuse hypokinesis. Doppler parameters are consistent with restrictive physiology, indicative of decreased left ventricular diastolic compliance and/or increased left atrial pressure. Doppler parameters are consistent with high ventricular filling pressure. - Aortic valve: Trivial regurgitation. - Mitral valve: Calcified annulus. Mild regurgitation. - Left atrium: The atrium was moderately dilated. - Right ventricle: The cavity size was mildly dilated. Systolic function was moderately reduced. - Right atrium: The atrium was moderately dilated. - Tricuspid valve: Moderate regurgitation. - Pulmonary arteries: Systolic pressure was severely increased. PA peak pressure: 58mm Hg (S).  Cardiac Cath Final Conclusions:   1. Severe three-vessel native coronary artery disease  2. Continued patency of the saphenous vein graft to PDA, saphenous vein graft to PLA, and LIMA to LAD  3. Moderate pulmonary hypertension likely related to left heart disease  4. Elevated left ventricular and pulmonary wedge pressure with large V waves   Recommendations: Continued medical therapy for treatment of congestive heart failure.  Sherren Mocha  09/04/2012, 3:25 PM   Assessment / Plan: 1. Ischemic CM - EF of 25 to 30% - looks better clinically. Will recheck BMET today. No change in his medicines. I will see him back in about 3 to 4 weeks. He is strongly encouraged to stay away from the salt/fast food. Use extra Lasix prn weight gain of 3 pounds overnight.   2. CAD - recent cath shows bypass grafts to be patent - no chest pain reported.   3. CKD - need to recheck labs today. He is on low dose ACE.   4. UTI - finishing Cipro today.   Overall he is doing better. We need to check labs today. I will see him in about 4 weeks.   Patient is agreeable to this plan and will call if any problems develop in the interim.

## 2012-09-12 NOTE — Patient Instructions (Addendum)
Stay on your current medicines  We need to recheck your lab today (BMET)  Weigh every morning. Take an extra dose of your Lasix in the afternoon if your weight goes up 3 pounds overnight. This would be fluid weight.   I will see you in 3 to 4 weeks  Stay away from salt/fast food  Call the [place] office at [phone] if you have any questions, problems or concerns.

## 2012-10-04 DIAGNOSIS — I2589 Other forms of chronic ischemic heart disease: Secondary | ICD-10-CM

## 2012-10-12 ENCOUNTER — Telehealth: Payer: Self-pay | Admitting: Cardiology

## 2012-10-12 NOTE — Telephone Encounter (Signed)
Pt needs refill of plavix uses [facility]

## 2012-10-13 ENCOUNTER — Other Ambulatory Visit: Payer: Self-pay | Admitting: *Deleted

## 2012-10-13 MED ORDER — CLOPIDOGREL BISULFATE 75 MG PO TABS: 75.0000 mg | ORAL_TABLET | Freq: Every day | ORAL | Status: DC

## 2012-10-17 ENCOUNTER — Encounter: Payer: Self-pay | Admitting: Nurse Practitioner

## 2012-10-17 ENCOUNTER — Other Ambulatory Visit: Payer: Self-pay

## 2012-10-17 ENCOUNTER — Ambulatory Visit (INDEPENDENT_AMBULATORY_CARE_PROVIDER_SITE_OTHER): Payer: Medicare Other | Admitting: Nurse Practitioner

## 2012-10-17 VITALS — BP 122/76 | HR 60 | Ht 72.0 in | Wt 194.0 lb

## 2012-10-17 DIAGNOSIS — I2589 Other forms of chronic ischemic heart disease: Secondary | ICD-10-CM

## 2012-10-17 DIAGNOSIS — I255 Ischemic cardiomyopathy: Secondary | ICD-10-CM

## 2012-10-17 DIAGNOSIS — I10 Essential (primary) hypertension: Secondary | ICD-10-CM

## 2012-10-17 LAB — BASIC METABOLIC PANEL
BUN: 43 mg/dL — ABNORMAL HIGH (ref 6–23)
CO2: 30 mEq/L (ref 19–32)
Calcium: 9.5 mg/dL (ref 8.4–10.5)
Chloride: 100 mEq/L (ref 96–112)
Creatinine, Ser: 1.4 mg/dL (ref 0.4–1.5)
GFR: 54.36 mL/min — ABNORMAL LOW (ref >60.00)
Glucose, Bld: 253 mg/dL — ABNORMAL HIGH (ref 70–99)
Potassium: 5 mEq/L (ref 3.5–5.1)
Sodium: 135 mEq/L (ref 135–145)

## 2012-10-17 NOTE — Progress Notes (Signed)
Scott [name] Date of Birth: Sep 05, 1933 Medical Record [id number]  History of Present Illness: Scott [name] is seen back today for a one month check. He is seen for Dr. Martinique. He has multiple issues which include chronic systolic and diastolic heart failure, HTN, HLD, IDDM, GERD, CAD with past CABG and recent cath showing his grafts were patent, stage 3 CKD, gout, UTIs and dietary noncompliance.   He was admitted back in November with worsening heart failure. Was diuresed 22 pounds. Echo showed his EF to be 25 to 30% with mild MR, mod TR, mod RAE and LAE, moderately reduced RV function and PASP of 77. Ad moderate pulmonary HTN on cath with elevated LV and pulmonary wedge pressures.   He was seen a month ago. Felt to be ok at that time.   He comes in today. He is here alone. He is doing very well. He has returned to swimming 3 x a week. No chest pain. Not short of breath. Swelling is gone. Weight is staying around 190 at home. He is avoiding salt. Tolerating his medicines well.   Current Outpatient Prescriptions on File Prior to Visit  Medication Sig Dispense Refill  . allopurinol (ZYLOPRIM) 100 MG tablet Take 2 tablets (200 mg total) by mouth daily.  60 tablet  1  . aspirin 81 MG EC tablet Take 81 mg by mouth daily.        . carvedilol (COREG) 12.5 MG tablet Take 1 tablet (12.5 mg total) by mouth 2 (two) times daily with a meal.  60 tablet  6  . clopidogrel (PLAVIX) 75 MG tablet Take 1 tablet (75 mg total) by mouth daily.  30 tablet  5  . colchicine 0.6 MG tablet Take 0.6 mg by mouth 3 (three) times daily as needed. For gout pain      . Cyanocobalamin (VITAMIN B-12 PO) Take 1 tablet by mouth daily.      . famotidine (PEPCID) 20 MG tablet Take 20 mg by mouth at bedtime.      . furosemide (LASIX) 40 MG tablet Take 40 mg by mouth daily.      . insulin NPH (HUMULIN N,NOVOLIN N) 100 UNIT/ML injection Inject 15 Units into the skin 2 (two) times daily before a meal.      . insulin regular (NOVOLIN  R,HUMULIN R) 100 units/mL injection Inject 10 Units into the skin 2 (two) times daily before a meal.      . lisinopril (PRINIVIL,ZESTRIL) 5 MG tablet Take 1 tablet (5 mg total) by mouth daily.  30 tablet  6  . Multiple Vitamin (MULTIVITAMIN) tablet Take 1 tablet by mouth daily.        . nitroGLYCERIN (NITROSTAT) 0.4 MG SL tablet Place 1 tablet (0.4 mg total) under the tongue every 5 (five) minutes as needed for chest pain (up to 3 doses).  25 tablet  3  . pantoprazole (PROTONIX) 40 MG tablet Take 1 tablet (40 mg total) by mouth daily.  30 tablet  2  . simvastatin (ZOCOR) 20 MG tablet Take 10 mg by mouth at bedtime.          No Known Allergies  Past Medical History  Diagnosis Date  . Hypertension   . Hyperkalemia   . Thrombocytopenia   . Dyslipidemia   . Arthritis   . Gout   . CAD (coronary artery disease)     3v CABG 1997; PCI of SVG to PL '05; PCI SVG to PL & SVG to PDA '11;  Cath 08/2012 patent grafts  . Sleep apnea   . Cataract   . Chronic combined systolic and diastolic CHF (congestive heart failure)     EF 50-55% 11/2011; EF 25-30% 08/2012  . Diabetes mellitus     insulin dependent  . Chronic kidney disease     Stage3  . GERD (gastroesophageal reflux disease)   . Gout   . UTI (lower urinary tract infection) 08/2012    E.coli    Past Surgical History  Procedure Date  . Coronary artery bypass graft 1997     (LIMA to LAD, SVG to PDA, SVG to PL)  . Cardiac catheterization 2005    stenting of the vein graft to the posterior lateral per -- Dr. Martinique      . Cardiac catheterization 2011    showed right coronary, totally occluded LAD and severe stenosis in both saphenous vein graft to the posterior lateral and posterior descending  -- stenting of the proximal portion of the SVG to the posterior lateral branch in 2/11  . Appendectomy   . Cholecystectomy   . Back surgery     15 years ago  . Colon surgery     cancerous polyps    History  Smoking status  . Former Smoker --  1.5 packs/day for 35 years  . Types: Cigarettes  . Quit date: 10/12/1983  Smokeless tobacco  . Never Used    History  Alcohol Use  . Yes    Comment: social only    Family History  Problem Relation Age of Onset  . Coronary artery disease Father   . Heart attack Father   . Coronary artery disease Brother     1/2 brother   . Kidney disease Sister   . Cardiomyopathy Mother   . Asthma Brother     Review of Systems: The review of systems is per the HPI.  All other systems were reviewed and are negative.  Physical Exam: BP 122/76  Pulse 60  Ht 6' (1.829 m)  Wt 194 lb (87.998 kg)  BMI 26.31 kg/m2 Patient is very pleasant and in no acute distress. Skin is warm and dry. Color is normal.  HEENT is unremarkable. Normocephalic/atraumatic. PERRL. Sclera are nonicteric. Neck is supple. No masses. No JVD. Lungs are clear. Cardiac exam shows a regular rate and rhythm. Abdomen is soft. Extremities are without edema. Gait and ROM are intact. No gross neurologic deficits noted.   LABORATORY DATA: BMET is pending  Lab Results  Component Value Date   WBC 6.1 09/04/2012   HGB 12.1* 09/04/2012   HCT 38.1* 09/04/2012   PLT 110* 09/04/2012   GLUCOSE 143* 09/12/2012   CHOL 72 09/01/2012   TRIG 55 09/01/2012   HDL 38* 09/01/2012   LDLCALC 23 09/01/2012   ALT 18 08/31/2012   AST 29 08/31/2012   NA 134* 09/12/2012   K 4.4 09/12/2012   CL 102 09/12/2012   CREATININE 1.4 09/12/2012   BUN 38* 09/12/2012   CO2 25 09/12/2012   TSH 1.942 08/31/2012   INR 1.29 08/31/2012   HGBA1C 7.0* 08/31/2012   Assessment / Plan:  1. Ischemic CM with systolic and diastolic heart failure - EF is 25 to 30% - he looks very well today. I have left him on his current regimen. He will see Dr. Martinique back in 2 months. Salt restriction I think will be the crux of him doing well.  2. CAD - patent grafts per cath in November. Will continue with medical  management  3. CKD - remains on low dose ACE - we are checking  lab today.   Patient is agreeable to this plan and will call if any problems develop in the interim.

## 2012-10-17 NOTE — Patient Instructions (Addendum)
I think you are doing very well.  Stay on your current medicines  Activity is as tolerated.   Continue to restrict your salt use  See Dr. Martinique in 2 months  Use extra Lasix if your weight goes up by 3 pounds overnight.   We will check lab today (BMET)  Call the [place] office at [phone] if you have any questions, problems or concerns.

## 2012-11-26 ENCOUNTER — Other Ambulatory Visit: Payer: Self-pay | Admitting: Pulmonary Disease

## 2012-11-26 DIAGNOSIS — G4733 Obstructive sleep apnea (adult) (pediatric): Secondary | ICD-10-CM

## 2012-12-09 ENCOUNTER — Encounter: Payer: Self-pay | Admitting: Family Medicine

## 2012-12-25 ENCOUNTER — Ambulatory Visit (INDEPENDENT_AMBULATORY_CARE_PROVIDER_SITE_OTHER): Payer: Medicare Other | Admitting: Cardiology

## 2012-12-25 ENCOUNTER — Other Ambulatory Visit (INDEPENDENT_AMBULATORY_CARE_PROVIDER_SITE_OTHER): Payer: Medicare Other

## 2012-12-25 ENCOUNTER — Encounter: Payer: Self-pay | Admitting: Cardiology

## 2012-12-25 VITALS — BP 134/66 | HR 63 | Ht 72.0 in | Wt 196.8 lb

## 2012-12-25 DIAGNOSIS — R0989 Other specified symptoms and signs involving the circulatory and respiratory systems: Secondary | ICD-10-CM

## 2012-12-25 DIAGNOSIS — I2589 Other forms of chronic ischemic heart disease: Secondary | ICD-10-CM

## 2012-12-25 DIAGNOSIS — I255 Ischemic cardiomyopathy: Secondary | ICD-10-CM

## 2012-12-25 DIAGNOSIS — I2510 Atherosclerotic heart disease of native coronary artery without angina pectoris: Secondary | ICD-10-CM

## 2012-12-25 DIAGNOSIS — I5023 Acute on chronic systolic (congestive) heart failure: Secondary | ICD-10-CM

## 2012-12-25 NOTE — Patient Instructions (Signed)
Continue your current therapy including sodium restriction.  Monitor your weight.  I will see you in 3 months.

## 2012-12-25 NOTE — Progress Notes (Signed)
Scott [name] Date of Birth: 1933-05-10 Medical Record [id number]  History of Present Illness: Scott [name] is seen today for follow up. He has multiple issues which include chronic systolic and diastolic heart failure, HTN, HLD, insulin dependent DM, GERD, CAD with past CABG, stage 3 CKD, gout, UTI and dietary noncompliance.   He has most recently been admitted in December with severe right heart failure. He was diuresed 22 pounds. Echo showed EF 25 to 30% with mild RM, mod TR, mod RAE & LAE, moderate reduced RV systolic function and PASP of 36mmHg. He did undergo cardiac cath which showed his grafts to be patent, moderate pulmonary HTN, elevated LV and pulmonary wedge pressures.  On follow up today he reports he is doing well. He is restricting his sodium intake. His weight at home has been stable. He denies any increase in edema, orthopnea, or dyspnea. His energy level is good.  Current Outpatient Prescriptions on File Prior to Visit  Medication Sig Dispense Refill  . allopurinol (ZYLOPRIM) 100 MG tablet Take 2 tablets (200 mg total) by mouth daily.  60 tablet  1  . aspirin 81 MG EC tablet Take 81 mg by mouth daily.        . carvedilol (COREG) 12.5 MG tablet Take 1 tablet (12.5 mg total) by mouth 2 (two) times daily with a meal.  60 tablet  6  . clopidogrel (PLAVIX) 75 MG tablet Take 1 tablet (75 mg total) by mouth daily.  30 tablet  5  . colchicine 0.6 MG tablet Take 0.6 mg by mouth 3 (three) times daily as needed. For gout pain      . Cyanocobalamin (VITAMIN B-12 PO) Take 1 tablet by mouth daily.      . famotidine (PEPCID) 20 MG tablet Take 20 mg by mouth at bedtime.      . furosemide (LASIX) 40 MG tablet Take 40 mg by mouth daily.      . insulin NPH (HUMULIN N,NOVOLIN N) 100 UNIT/ML injection Inject 15 Units into the skin 2 (two) times daily before a meal.      . insulin regular (NOVOLIN R,HUMULIN R) 100 units/mL injection Inject 10 Units into the skin 2 (two) times daily before a meal.      .  lisinopril (PRINIVIL,ZESTRIL) 5 MG tablet Take 1 tablet (5 mg total) by mouth daily.  30 tablet  6  . Multiple Vitamin (MULTIVITAMIN) tablet Take 1 tablet by mouth daily.        . nitroGLYCERIN (NITROSTAT) 0.4 MG SL tablet Place 1 tablet (0.4 mg total) under the tongue every 5 (five) minutes as needed for chest pain (up to 3 doses).  25 tablet  3  . pantoprazole (PROTONIX) 40 MG tablet Take 1 tablet (40 mg total) by mouth daily.  30 tablet  2  . simvastatin (ZOCOR) 20 MG tablet Take 10 mg by mouth at bedtime.         No current facility-administered medications on file prior to visit.    No Known Allergies  Past Medical History  Diagnosis Date  . Hypertension   . Hyperkalemia   . Thrombocytopenia   . Dyslipidemia   . Arthritis   . Gout   . CAD (coronary artery disease)     3v CABG 1997; PCI of SVG to PL '05; PCI SVG to PL & SVG to PDA '11; Cath 08/2012 patent grafts  . Sleep apnea   . Cataract   . Chronic combined systolic and diastolic CHF (  congestive heart failure)     EF 50-55% 11/2011; EF 25-30% 08/2012  . Diabetes mellitus     insulin dependent  . Chronic kidney disease     Stage3  . GERD (gastroesophageal reflux disease)   . Gout   . UTI (lower urinary tract infection) 08/2012    E.coli    Past Surgical History  Procedure Laterality Date  . Coronary artery bypass graft  1997     (LIMA to LAD, SVG to PDA, SVG to PL)  . Cardiac catheterization  2005    stenting of the vein graft to the posterior lateral per -- Dr. Martinique      . Cardiac catheterization  2011    showed right coronary, totally occluded LAD and severe stenosis in both saphenous vein graft to the posterior lateral and posterior descending  -- stenting of the proximal portion of the SVG to the posterior lateral branch in 2/11  . Appendectomy    . Cholecystectomy    . Back surgery      15 years ago  . Colon surgery      cancerous polyps    History  Smoking status  . Former Smoker -- 1.50 packs/day for  35 years  . Types: Cigarettes  . Quit date: 10/12/1983  Smokeless tobacco  . Never Used    History  Alcohol Use  . Yes    Comment: social only    Family History  Problem Relation Age of Onset  . Coronary artery disease Father   . Heart attack Father   . Coronary artery disease Brother     1/2 brother   . Kidney disease Sister   . Cardiomyopathy Mother   . Asthma Brother     Review of Systems: Patient had recent steroid injections in his right knee and right shoulder. This has resulted in a significant increase in his blood sugars.  All other systems were reviewed and are negative.  Physical Exam: BP 134/66  Pulse 63  Ht 6' (1.829 m)  Wt 196 lb 12.8 oz (89.268 kg)  BMI 26.69 kg/m2  SpO2 95% Patient was weighed with his coat on. Patient is very pleasant and in no acute distress. Skin is warm and dry. Color is normal.  HEENT is unremarkable. Normocephalic/atraumatic. PERRL. Sclera are nonicteric. Neck is supple. No masses. No JVD. Lungs are clear. Cardiac exam shows a regular rate and rhythm. Abdomen is soft. Extremities are with just trace edema. Gait and ROM are intact. No gross neurologic deficits noted.   LABORATORY DATA: BMET is pending.   Lab Results  Component Value Date   WBC 6.1 09/04/2012   HGB 12.1* 09/04/2012   HCT 38.1* 09/04/2012   PLT 110* 09/04/2012   GLUCOSE 253* 10/17/2012   CHOL 72 09/01/2012   TRIG 55 09/01/2012   HDL 38* 09/01/2012   LDLCALC 23 09/01/2012   ALT 18 08/31/2012   AST 29 08/31/2012   NA 135 10/17/2012   K 5.0 10/17/2012   CL 100 10/17/2012   CREATININE 1.4 10/17/2012   BUN 43* 10/17/2012   CO2 30 10/17/2012   TSH 1.942 08/31/2012   INR 1.29 08/31/2012   HGBA1C 7.0* 08/31/2012   Echo Study Conclusions  - Left ventricle: The cavity size was mildly dilated. Wall thickness was increased in a pattern of mild LVH. Systolic function was severely reduced. The estimated ejection fraction was in the range of 25% to 30%. Diffuse hypokinesis.  Doppler parameters are consistent with restrictive physiology, indicative  of decreased left ventricular diastolic compliance and/or increased left atrial pressure. Doppler parameters are consistent with high ventricular filling pressure. - Aortic valve: Trivial regurgitation. - Mitral valve: Calcified annulus. Mild regurgitation. - Left atrium: The atrium was moderately dilated. - Right ventricle: The cavity size was mildly dilated. Systolic function was moderately reduced. - Right atrium: The atrium was moderately dilated. - Tricuspid valve: Moderate regurgitation. - Pulmonary arteries: Systolic pressure was severely increased. PA peak pressure: 59mm Hg (S).  Cardiac Cath Final Conclusions:   1. Severe three-vessel native coronary artery disease  2. Continued patency of the saphenous vein graft to PDA, saphenous vein graft to PLA, and LIMA to LAD  3. Moderate pulmonary hypertension likely related to left heart disease  4. Elevated left ventricular and pulmonary wedge pressure with large V waves   Recommendations: Continued medical therapy for treatment of congestive heart failure.  Sherren Mocha  09/04/2012, 3:25 PM   Assessment / Plan: 1. Chronic systolic and diastolic congestive heart care secondary to Ischemic CM - EF of 25 to 30% - clinically well compensated. No change in his medicines. I will see him back in 3 months. He is strongly encouraged to stay away from the salt/fast food. Use extra Lasix prn weight gain of 3 pounds overnight. We will check fasting lab work on his return.  2. CAD - recent cath shows bypass grafts to be patent - no chest pain reported.   3. CKD -  He is on low dose ACE.

## 2013-02-22 ENCOUNTER — Encounter: Payer: Self-pay | Admitting: Pulmonary Disease

## 2013-02-22 ENCOUNTER — Ambulatory Visit (INDEPENDENT_AMBULATORY_CARE_PROVIDER_SITE_OTHER): Payer: Medicare Other | Admitting: Pulmonary Disease

## 2013-02-22 VITALS — BP 142/62 | HR 50 | Temp 97.4°F | Ht 72.0 in | Wt 207.8 lb

## 2013-02-22 DIAGNOSIS — G4733 Obstructive sleep apnea (adult) (pediatric): Secondary | ICD-10-CM

## 2013-02-22 NOTE — Assessment & Plan Note (Signed)
The pt is doing well with cpap at optimal pressure.  He denies any mask or pressure issues, and feels his sleep and daytime alertness have improved.  I have asked him to keep up with his supplies, and to keep working aggressively on weight loss.  Will see him back in one year if doing well.

## 2013-02-22 NOTE — Patient Instructions (Addendum)
Continue with cpap, and keep up with mask cushion changes and supplies Keep working on weight loss followup with me in one year if doing well, but call sooner if you get to 180 pounds.

## 2013-02-22 NOTE — Progress Notes (Signed)
  Subjective:    Patient ID: Scott [name], male    DOB: Aug 20, 1933, 79 y.o.   MRN: [id number]  HPI The patient comes in today for followup of his obstructive sleep apnea.  He is wearing CPAP compliantly and his optimized pressure, and feels that his sleep is definitely improved with increased daytime alertness.  He is having no issues with his mask fit or pressure.  He has lost 22 pounds since his last visit, but tells me the majority of that is fluid because of congestive heart failure.   Review of Systems  Constitutional: Negative for fever and unexpected weight change.  HENT: Positive for rhinorrhea. Negative for ear pain, nosebleeds, congestion, sore throat, sneezing, trouble swallowing, dental problem, postnasal drip and sinus pressure.        Allergies  Eyes: Negative for redness and itching.  Respiratory: Negative for cough, chest tightness, shortness of breath and wheezing.   Cardiovascular: Negative for palpitations and leg swelling.  Gastrointestinal: Negative for nausea and vomiting.  Genitourinary: Negative for dysuria.  Musculoskeletal: Negative for joint swelling.  Skin: Negative for rash.  Neurological: Negative for headaches.  Hematological: Does not bruise/bleed easily.  Psychiatric/Behavioral: Negative for dysphoric mood. The patient is not nervous/anxious.        Objective:   Physical Exam Well-developed male in no acute distress Nose without purulence or discharge noted No skin breakdown or pressure necrosis from the CPAP mask Neck without lymphadenopathy or thyromegaly Lower extremities with no significant edema, no cyanosis Alert, does not appear to be overly sleepy, moves all 4 extremities.       Assessment & Plan:

## 2013-03-27 ENCOUNTER — Ambulatory Visit (INDEPENDENT_AMBULATORY_CARE_PROVIDER_SITE_OTHER): Payer: Medicare Other | Admitting: Cardiology

## 2013-03-27 ENCOUNTER — Encounter: Payer: Self-pay | Admitting: Cardiology

## 2013-03-27 VITALS — BP 138/62 | HR 58 | Ht 72.0 in | Wt 209.0 lb

## 2013-03-27 DIAGNOSIS — I2589 Other forms of chronic ischemic heart disease: Secondary | ICD-10-CM

## 2013-03-27 DIAGNOSIS — I255 Ischemic cardiomyopathy: Secondary | ICD-10-CM

## 2013-03-27 DIAGNOSIS — I5023 Acute on chronic systolic (congestive) heart failure: Secondary | ICD-10-CM

## 2013-03-27 DIAGNOSIS — I2510 Atherosclerotic heart disease of native coronary artery without angina pectoris: Secondary | ICD-10-CM

## 2013-03-27 NOTE — Patient Instructions (Signed)
Increase your Lasix to 40 mg twice a day.  Restrict your sodium intake.  I will see you in 4 weeks with lab work  1.5 Gram Low Sodium Diet A 1.5 gram sodium diet restricts the amount of sodium in the diet to no more than 1.5 g or 1500 mg daily. The American Heart Association recommends Americans over the age of 67 to consume no more than 1500 mg of sodium each day to reduce the risk of developing high blood pressure. Research also shows that limiting sodium may reduce heart attack and stroke risk. Many foods contain sodium for flavor and sometimes as a preservative. When the amount of sodium in a diet needs to be low, it is important to know what to look for when choosing foods and drinks. The following includes some information and guidelines to help make it easier for you to adapt to a low sodium diet. QUICK TIPS  Do not add salt to food.  Avoid convenience items and fast food.  Choose unsalted snack foods.  Buy lower sodium products, often labeled as "lower sodium" or "no salt added."  Check food labels to learn how much sodium is in 1 serving.  When eating at a restaurant, ask that your food be prepared with less salt or none, if possible. READING FOOD LABELS FOR SODIUM INFORMATION The nutrition facts label is a good place to find how much sodium is in foods. Look for products with no more than 400 mg of sodium per serving. Remember that 1.5 g = 1500 mg. The food label may also list foods as:  Sodium-free: Less than 5 mg in a serving.  Very low sodium: 35 mg or less in a serving.  Low-sodium: 140 mg or less in a serving.  Light in sodium: 50% less sodium in a serving. For example, if a food that usually has 300 mg of sodium is changed to become light in sodium, it will have 150 mg of sodium.  Reduced sodium: 25% less sodium in a serving. For example, if a food that usually has 400 mg of sodium is changed to reduced sodium, it will have 300 mg of sodium. CHOOSING  FOODS Grains  Avoid: Salted crackers and snack items. Some cereals, including instant hot cereals. Bread stuffing and biscuit mixes. Seasoned rice or pasta mixes.  Choose: Unsalted snack items. Low-sodium cereals, oats, puffed wheat and rice, shredded wheat. English muffins and bread. Pasta. Meats  Avoid: Salted, canned, smoked, spiced, pickled meats, including fish and poultry. Bacon, ham, sausage, cold cuts, hot dogs, anchovies.  Choose: Low-sodium canned tuna and salmon. Fresh or frozen meat, poultry, and fish. Dairy  Avoid: Processed cheese and spreads. Cottage cheese. Buttermilk and condensed milk. Regular cheese.  Choose: Milk. Low-sodium cottage cheese. Yogurt. Sour cream. Low-sodium cheese. Fruits and Vegetables  Avoid: Regular canned vegetables. Regular canned tomato sauce and paste. Frozen vegetables in sauces. Olives. Angie Fava. Relishes. Sauerkraut.  Choose: Low-sodium canned vegetables. Low-sodium tomato sauce and paste. Frozen or fresh vegetables. Fresh and frozen fruit. Condiments  Avoid: Canned and packaged gravies. Worcestershire sauce. Tartar sauce. Barbecue sauce. Soy sauce. Steak sauce. Ketchup. Onion, garlic, and table salt. Meat flavorings and tenderizers.  Choose: Fresh and dried herbs and spices. Low-sodium varieties of mustard and ketchup. Lemon juice. Tabasco sauce. Horseradish. SAMPLE 1.5 GRAM SODIUM MEAL PLAN Breakfast / Sodium (mg)  1 cup low-fat milk / 143 mg  1 whole-wheat English muffin / 240 mg  1 tbs heart-healthy margarine / 153 mg  1  hard-boiled egg / 139 mg  1 small orange / 0 mg Lunch / Sodium (mg)  1 cup raw carrots / 76 mg  2 tbs no salt added peanut butter / 5 mg  2 slices whole-wheat bread / 270 mg  1 tbs jelly / 6 mg   cup red grapes / 2 mg Dinner / Sodium (mg)  1 cup whole-wheat pasta / 2 mg  1 cup low-sodium tomato sauce / 73 mg  3 oz lean ground beef / 57 mg  1 small side salad (1 cup raw spinach leaves,  cup  cucumber,  cup yellow bell pepper) with 1 tsp olive oil and 1 tsp red wine vinegar / 25 mg Snack / Sodium (mg)  1 container low-fat vanilla yogurt / 107 mg  3 graham cracker squares / 127 mg Nutrient Analysis  Calories: 1745  Protein: 75 g  Carbohydrate: 237 g  Fat: 57 g  Sodium: 1425 mg Document Released: 09/27/2005 Document Revised: 12/20/2011 Document Reviewed: 12/29/2009 [employer] Patient Information 2014 [place], [place].

## 2013-03-31 NOTE — Progress Notes (Signed)
[name] Date of Birth: 17-Dec-1932 Medical Record [id number]  History of Present Illness: Scott [name] is seen today for follow up. He has multiple issues which include chronic systolic and diastolic heart failure, HTN, HLD, insulin dependent DM, GERD, CAD with past CABG, stage 3 CKD, gout, UTI and dietary noncompliance.  He was most recently been admitted in December 2013 with severe right heart failure. He was diuresed 22 pounds. Echo showed EF 25 to 30% with mild RM, mod TR, mod RAE & LAE, moderate reduced RV systolic function and PASP of 44mmHg. He did undergo cardiac cath which showed his grafts to be patent, moderate pulmonary HTN, elevated LV and pulmonary wedge pressures. On followup today he continues to struggle with his weight. He still eats out most of his meals and so has a high sodium intake. His weight is up 13 pounds. He does note some increased abdominal swelling but denies any increased lower extremity edema, shortness of breath, orthopnea, or chest pain. [facility]   Current Outpatient Prescriptions on File Prior to Visit  Medication Sig Dispense Refill  . allopurinol (ZYLOPRIM) 100 MG tablet Take 2 tablets (200 mg total) by mouth daily.  60 tablet  1  . aspirin 81 MG EC tablet Take 81 mg by mouth daily.        . carvedilol (COREG) 12.5 MG tablet Take 1 tablet (12.5 mg total) by mouth 2 (two) times daily with a meal.  60 tablet  6  . clopidogrel (PLAVIX) 75 MG tablet Take 1 tablet (75 mg total) by mouth daily.  30 tablet  5  . colchicine 0.6 MG tablet Take 0.6 mg by mouth 3 (three) times daily as needed. For gout pain      . Cyanocobalamin (VITAMIN B-12 PO) Take 1 tablet by mouth daily.      . famotidine (PEPCID) 20 MG tablet Take 20 mg by mouth at bedtime.      . furosemide (LASIX) 40 MG tablet Take 40 mg by mouth daily.      . insulin NPH (HUMULIN N,NOVOLIN N) 100 UNIT/ML injection Inject 15 Units into the skin 2 (two) times daily before a  meal.      . insulin regular (NOVOLIN R,HUMULIN R) 100 units/mL injection Inject 10 Units into the skin 2 (two) times daily before a meal.      . lisinopril (PRINIVIL,ZESTRIL) 5 MG tablet Take 1 tablet (5 mg total) by mouth daily.  30 tablet  6  . Multiple Vitamin (MULTIVITAMIN) tablet Take 1 tablet by mouth daily.        . nitroGLYCERIN (NITROSTAT) 0.4 MG SL tablet Place 1 tablet (0.4 mg total) under the tongue every 5 (five) minutes as needed for chest pain (up to 3 doses).  25 tablet  3  . pantoprazole (PROTONIX) 40 MG tablet Take 1 tablet (40 mg total) by mouth daily.  30 tablet  2  . simvastatin (ZOCOR) 20 MG tablet Take 10 mg by mouth at bedtime.         No current facility-administered medications on file prior to visit.    No Known Allergies  Past Medical History  Diagnosis Date  . Hypertension   . Hyperkalemia   . Thrombocytopenia   . Dyslipidemia   . Arthritis   . Gout   . CAD (coronary artery disease)     3v CABG 1997; PCI of SVG to PL '05; PCI SVG to  PL & SVG to PDA '11; Cath 08/2012 patent grafts  . Sleep apnea   . Cataract   . Chronic combined systolic and diastolic CHF (congestive heart failure)     EF 50-55% 11/2011; EF 25-30% 08/2012  . Diabetes mellitus     insulin dependent  . Chronic kidney disease     Stage3  . GERD (gastroesophageal reflux disease)   . Gout   . UTI (lower urinary tract infection) 08/2012    E.coli    Past Surgical History  Procedure Laterality Date  . Coronary artery bypass graft  1997     (LIMA to LAD, SVG to PDA, SVG to PL)  . Cardiac catheterization  2005    stenting of the vein graft to the posterior lateral per -- Dr. Martinique      . Cardiac catheterization  2011    showed right coronary, totally occluded LAD and severe stenosis in both saphenous vein graft to the posterior lateral and posterior descending  -- stenting of the proximal portion of the SVG to the posterior lateral branch in 2/11  . Appendectomy    . Cholecystectomy     . Back surgery      15 years ago  . Colon surgery      cancerous polyps    History  Smoking status  . Former Smoker -- 1.50 packs/day for 35 years  . Types: Cigarettes  . Quit date: 10/12/1983  Smokeless tobacco  . Never Used    History  Alcohol Use  . Yes    Comment: social only    Family History  Problem Relation Age of Onset  . Coronary artery disease Father   . Heart attack Father   . Coronary artery disease Brother     1/2 brother   . Kidney disease Sister   . Cardiomyopathy Mother   . Asthma Brother     Review of Systems: As noted in history of present illness.  All other systems were reviewed and are negative.  Physical Exam: BP 138/62  Pulse 58  Ht 6' (1.829 m)  Wt 209 lb (94.802 kg)  BMI 28.34 kg/m2 Patient is very pleasant and in no acute distress. Skin is warm and dry. Color is normal.  HEENT is unremarkable. Normocephalic/atraumatic. PERRL. Sclera are nonicteric. Neck is supple. No masses. No JVD. Lungs are clear. Cardiac exam shows a regular rate and rhythm. Abdomen is soft. It is mildly distended. No hepatosplenomegaly. Bowel sounds are positive. Extremities are with just trace edema. Gait and ROM are intact. No gross neurologic deficits noted.   LABORATORY DATA:   Lab Results  Component Value Date   WBC 6.1 09/04/2012   HGB 12.1* 09/04/2012   HCT 38.1* 09/04/2012   PLT 110* 09/04/2012   GLUCOSE 253* 10/17/2012   CHOL 72 09/01/2012   TRIG 55 09/01/2012   HDL 38* 09/01/2012   LDLCALC 23 09/01/2012   ALT 18 08/31/2012   AST 29 08/31/2012   NA 135 10/17/2012   K 5.0 10/17/2012   CL 100 10/17/2012   CREATININE 1.4 10/17/2012   BUN 43* 10/17/2012   CO2 30 10/17/2012   TSH 1.942 08/31/2012   INR 1.29 08/31/2012   HGBA1C 7.0* 08/31/2012   Echo Study Conclusions  - Left ventricle: The cavity size was mildly dilated. Wall thickness was increased in a pattern of mild LVH. Systolic function was severely reduced. The estimated ejection fraction was in  the range of 25% to 30%. Diffuse hypokinesis. Doppler parameters  are consistent with restrictive physiology, indicative of decreased left ventricular diastolic compliance and/or increased left atrial pressure. Doppler parameters are consistent with high ventricular filling pressure. - Aortic valve: Trivial regurgitation. - Mitral valve: Calcified annulus. Mild regurgitation. - Left atrium: The atrium was moderately dilated. - Right ventricle: The cavity size was mildly dilated. Systolic function was moderately reduced. - Right atrium: The atrium was moderately dilated. - Tricuspid valve: Moderate regurgitation. - Pulmonary arteries: Systolic pressure was severely increased. PA peak pressure: 73mm Hg (S).  Cardiac Cath Final Conclusions:   1. Severe three-vessel native coronary artery disease  2. Continued patency of the saphenous vein graft to PDA, saphenous vein graft to PLA, and LIMA to LAD  3. Moderate pulmonary hypertension likely related to left heart disease  4. Elevated left ventricular and pulmonary wedge pressure with large V waves   Recommendations: Continued medical therapy for treatment of congestive heart failure.  Sherren Mocha  09/04/2012, 3:25 PM   Assessment / Plan: 1. Chronic systolic and diastolic congestive heart care secondary to Ischemic CM - EF of 25 to 30% - clinically volume overloaded with weight gain of 13 pounds. He does weigh himself at home but doesn't make any adjustments in his diuretics if he gains weight. I recommended increasing his Lasix to 40 mg twice a day. Suggested that if he gains more than 3 pounds in a day or 5 pounds in a week he should increase this to 3 times a day. I will followup again in 4 weeks and we will check a CBC and basic metabolic panel. If his renal function remains stable I would consider adding Aldactone at that time.  2. CAD - prior cath shows bypass grafts to be patent - no chest pain reported.   3. CKD -  He is on low  dose ACE.

## 2013-04-11 NOTE — Progress Notes (Signed)
This encounter was created in error - please disregard.

## 2013-05-02 ENCOUNTER — Encounter: Payer: Self-pay | Admitting: Cardiology

## 2013-05-02 ENCOUNTER — Other Ambulatory Visit: Payer: Self-pay | Admitting: Nurse Practitioner

## 2013-05-02 ENCOUNTER — Ambulatory Visit (INDEPENDENT_AMBULATORY_CARE_PROVIDER_SITE_OTHER): Payer: Non-veteran care | Admitting: Cardiology

## 2013-05-02 VITALS — BP 130/69 | HR 64 | Ht 72.0 in | Wt 201.0 lb

## 2013-05-02 DIAGNOSIS — I2510 Atherosclerotic heart disease of native coronary artery without angina pectoris: Secondary | ICD-10-CM

## 2013-05-02 DIAGNOSIS — I2589 Other forms of chronic ischemic heart disease: Secondary | ICD-10-CM

## 2013-05-02 DIAGNOSIS — I10 Essential (primary) hypertension: Secondary | ICD-10-CM

## 2013-05-02 DIAGNOSIS — I5023 Acute on chronic systolic (congestive) heart failure: Secondary | ICD-10-CM

## 2013-05-02 DIAGNOSIS — I255 Ischemic cardiomyopathy: Secondary | ICD-10-CM

## 2013-05-02 LAB — CBC WITH DIFFERENTIAL/PLATELET
Basophils Absolute: 0 10*3/uL (ref 0.0–0.1)
Basophils Relative: 0.4 % (ref 0.0–3.0)
Eosinophils Absolute: 0.1 10*3/uL (ref 0.0–0.7)
Eosinophils Relative: 1.4 % (ref 0.0–5.0)
HCT: 44.3 % (ref 39.0–52.0)
Hemoglobin: 14.8 g/dL (ref 13.0–17.0)
Lymphocytes Relative: 21.4 % (ref 12.0–46.0)
Lymphs Abs: 1.9 10*3/uL (ref 0.7–4.0)
MCHC: 33.3 g/dL (ref 30.0–36.0)
MCV: 93.8 fl (ref 78.0–100.0)
Monocytes Absolute: 1.1 10*3/uL — ABNORMAL HIGH (ref 0.1–1.0)
Monocytes Relative: 12.3 % — ABNORMAL HIGH (ref 3.0–12.0)
Neutro Abs: 5.7 10*3/uL (ref 1.4–7.7)
Neutrophils Relative %: 64.5 % (ref 43.0–77.0)
Platelets: 97 10*3/uL — ABNORMAL LOW (ref 150.0–400.0)
RBC: 4.72 Mil/uL (ref 4.22–5.81)
RDW: 14.5 % (ref 11.5–14.6)
WBC: 8.9 10*3/uL (ref 4.5–10.5)

## 2013-05-02 LAB — BASIC METABOLIC PANEL
BUN: 41 mg/dL — ABNORMAL HIGH (ref 6–23)
CO2: 28 mEq/L (ref 19–32)
Calcium: 10.2 mg/dL (ref 8.4–10.5)
Chloride: 100 mEq/L (ref 96–112)
Creatinine, Ser: 1.6 mg/dL — ABNORMAL HIGH (ref 0.4–1.5)
GFR: 44.62 mL/min — ABNORMAL LOW (ref >60.00)
Glucose, Bld: 186 mg/dL — ABNORMAL HIGH (ref 70–99)
Potassium: 5.3 mEq/L — ABNORMAL HIGH (ref 3.5–5.1)
Sodium: 137 mEq/L (ref 135–145)

## 2013-05-02 NOTE — Addendum Note (Signed)
Addended by: Roberts Gaudy on: 05/02/2013 12:22 PM   Modules accepted: Orders

## 2013-05-02 NOTE — Patient Instructions (Signed)
Continue the higher dose of lasix  Continue sodium restriction  We will check lab work today  I will see you in 6 weeks.

## 2013-05-02 NOTE — Progress Notes (Signed)
Scott [name] Date of Birth: 22-Jun-1933 Medical Record [id number]  History of Present Illness: Scott [name] is seen today for follow up. He has multiple issues which include chronic systolic and diastolic heart failure, HTN, HLD, insulin dependent DM, GERD, CAD with past CABG, stage 3 CKD, gout, UTI and dietary noncompliance.  He was most recently been admitted in December 2013 with severe right heart failure. He was diuresed 22 pounds. Echo showed EF 25 to 30% with mild RM, mod TR, mod RAE & LAE, moderate reduced RV systolic function and PASP of 25mmHg. He did undergo cardiac cath which showed his grafts to be patent, moderate pulmonary HTN, elevated LV and pulmonary wedge pressures. On his last visit in June he had gained 13 pounds. We increased his Lasix to 80 mg per day. He has been trying to do better with his diet. He has responded well to the increased diuretic dose and his weight is now down 8 pounds. He denies any increase in shortness of breath and it may be a little bit better. In reviewing his old records he apparently has a history of hyperkalemia. He states this was diagnosed about 7 years ago and he has had to be careful with high potassium foods.   Current Outpatient Prescriptions on File Prior to Visit  Medication Sig Dispense Refill  . allopurinol (ZYLOPRIM) 100 MG tablet Take 2 tablets (200 mg total) by mouth daily.  60 tablet  1  . aspirin 81 MG EC tablet Take 81 mg by mouth daily.        . carvedilol (COREG) 12.5 MG tablet Take 1 tablet (12.5 mg total) by mouth 2 (two) times daily with a meal.  60 tablet  6  . colchicine 0.6 MG tablet Take 0.6 mg by mouth 3 (three) times daily as needed. For gout pain      . Cyanocobalamin (VITAMIN B-12 PO) Take 1 tablet by mouth daily.      . famotidine (PEPCID) 20 MG tablet Take 20 mg by mouth at bedtime.      . furosemide (LASIX) 40 MG tablet Take 80 mg by mouth daily. 40 mg tab in the morning and 40 mg tab at night      . insulin NPH (HUMULIN  N,NOVOLIN N) 100 UNIT/ML injection Inject 15 Units into the skin 2 (two) times daily before a meal.      . insulin regular (NOVOLIN R,HUMULIN R) 100 units/mL injection Inject 10 Units into the skin 2 (two) times daily before a meal.      . lisinopril (PRINIVIL,ZESTRIL) 5 MG tablet Take 1 tablet (5 mg total) by mouth daily.  30 tablet  6  . Multiple Vitamin (MULTIVITAMIN) tablet Take 1 tablet by mouth daily.        . nitroGLYCERIN (NITROSTAT) 0.4 MG SL tablet Place 1 tablet (0.4 mg total) under the tongue every 5 (five) minutes as needed for chest pain (up to 3 doses).  25 tablet  3  . pantoprazole (PROTONIX) 40 MG tablet Take 1 tablet (40 mg total) by mouth daily.  30 tablet  2  . simvastatin (ZOCOR) 20 MG tablet Take 10 mg by mouth at bedtime.         No current facility-administered medications on file prior to visit.    No Known Allergies  Past Medical History  Diagnosis Date  . Hypertension   . Hyperkalemia   . Thrombocytopenia   . Dyslipidemia   . Arthritis   . Gout   .  CAD (coronary artery disease)     3v CABG 1997; PCI of SVG to PL '05; PCI SVG to PL & SVG to PDA '11; Cath 08/2012 patent grafts  . Sleep apnea   . Cataract   . Chronic combined systolic and diastolic CHF (congestive heart failure)     EF 50-55% 11/2011; EF 25-30% 08/2012  . Diabetes mellitus     insulin dependent  . Chronic kidney disease     Stage3  . GERD (gastroesophageal reflux disease)   . Gout   . UTI (lower urinary tract infection) 08/2012    E.coli    Past Surgical History  Procedure Laterality Date  . Coronary artery bypass graft  1997     (LIMA to LAD, SVG to PDA, SVG to PL)  . Cardiac catheterization  2005    stenting of the vein graft to the posterior lateral per -- Dr. Martinique      . Cardiac catheterization  2011    showed right coronary, totally occluded LAD and severe stenosis in both saphenous vein graft to the posterior lateral and posterior descending  -- stenting of the proximal  portion of the SVG to the posterior lateral branch in 2/11  . Appendectomy    . Cholecystectomy    . Back surgery      15 years ago  . Colon surgery      cancerous polyps    History  Smoking status  . Former Smoker -- 1.50 packs/day for 35 years  . Types: Cigarettes  . Quit date: 10/12/1983  Smokeless tobacco  . Never Used    History  Alcohol Use  . Yes    Comment: social only    Family History  Problem Relation Age of Onset  . Coronary artery disease Father   . Heart attack Father   . Coronary artery disease Brother     1/2 brother   . Kidney disease Sister   . Cardiomyopathy Mother   . Asthma Brother     Review of Systems: As noted in history of present illness.  All other systems were reviewed and are negative.  Physical Exam: BP 130/69  Pulse 64  Ht 6' (1.829 m)  Wt 201 lb (91.173 kg)  BMI 27.25 kg/m2 Patient is very pleasant and in no acute distress. Skin is warm and dry. Color is normal.  HEENT is unremarkable. Normocephalic/atraumatic. PERRL. Sclera are nonicteric. Neck is supple. No masses. No JVD. Lungs are clear. Cardiac exam shows a regular rate and rhythm. Abdomen is soft. It is not distended. No hepatosplenomegaly. Bowel sounds are positive. Extremities are without edema. Gait and ROM are intact. No gross neurologic deficits noted.   LABORATORY DATA:   Lab Results  Component Value Date   WBC 6.1 09/04/2012   HGB 12.1* 09/04/2012   HCT 38.1* 09/04/2012   PLT 110* 09/04/2012   GLUCOSE 253* 10/17/2012   CHOL 72 09/01/2012   TRIG 55 09/01/2012   HDL 38* 09/01/2012   LDLCALC 23 09/01/2012   ALT 18 08/31/2012   AST 29 08/31/2012   NA 135 10/17/2012   K 5.0 10/17/2012   CL 100 10/17/2012   CREATININE 1.4 10/17/2012   BUN 43* 10/17/2012   CO2 30 10/17/2012   TSH 1.942 08/31/2012   INR 1.29 08/31/2012   HGBA1C 7.0* 08/31/2012   Echo Study Conclusions  - Left ventricle: The cavity size was mildly dilated. Wall thickness was increased in a pattern of  mild LVH. Systolic function was  severely reduced. The estimated ejection fraction was in the range of 25% to 30%. Diffuse hypokinesis. Doppler parameters are consistent with restrictive physiology, indicative of decreased left ventricular diastolic compliance and/or increased left atrial pressure. Doppler parameters are consistent with high ventricular filling pressure. - Aortic valve: Trivial regurgitation. - Mitral valve: Calcified annulus. Mild regurgitation. - Left atrium: The atrium was moderately dilated. - Right ventricle: The cavity size was mildly dilated. Systolic function was moderately reduced. - Right atrium: The atrium was moderately dilated. - Tricuspid valve: Moderate regurgitation. - Pulmonary arteries: Systolic pressure was severely increased. PA peak pressure: 56mm Hg (S).  Cardiac Cath Final Conclusions:   1. Severe three-vessel native coronary artery disease  2. Continued patency of the saphenous vein graft to PDA, saphenous vein graft to PLA, and LIMA to LAD  3. Moderate pulmonary hypertension likely related to left heart disease  4. Elevated left ventricular and pulmonary wedge pressure with large V waves   Recommendations: Continued medical therapy for treatment of congestive heart failure.  Sherren Mocha  09/04/2012, 3:25 PM   Assessment / Plan: 1. Chronic systolic and diastolic congestive heart care secondary to Ischemic CM - EF of 25 to 30% - clinically improved with a weight loss of 8 pounds on a higher dose of Lasix.  I recommended continuing his Lasix to 40 mg twice a day. Suggested that if he gains more than 3 pounds in a day or 5 pounds in a week he should increase this to 3 times a day. We will check a basic metabolic panel and CBC today. Given his history of hyperkalemia he is not a good candidate for Aldactone. I will followup again in 6 weeks.  2. CAD - prior cath shows bypass grafts to be patent - no chest pain reported.   3. CKD -  He is on low  dose ACE.

## 2013-05-03 ENCOUNTER — Other Ambulatory Visit: Payer: Self-pay

## 2013-05-03 DIAGNOSIS — I10 Essential (primary) hypertension: Secondary | ICD-10-CM

## 2013-05-12 ENCOUNTER — Observation Stay (HOSPITAL_COMMUNITY)
Admission: EM | Admit: 2013-05-12 | Discharge: 2013-05-13 | Disposition: A | Discharge status: Home / Self Care | Payer: Medicare Other | Attending: Cardiology | Admitting: Cardiology

## 2013-05-12 ENCOUNTER — Encounter (HOSPITAL_COMMUNITY): Payer: Self-pay | Admitting: Emergency Medicine

## 2013-05-12 ENCOUNTER — Emergency Department (HOSPITAL_COMMUNITY): Payer: Medicare Other

## 2013-05-12 DIAGNOSIS — I10 Essential (primary) hypertension: Secondary | ICD-10-CM | POA: Diagnosis present

## 2013-05-12 DIAGNOSIS — I509 Heart failure, unspecified: Secondary | ICD-10-CM | POA: Insufficient documentation

## 2013-05-12 DIAGNOSIS — E119 Type 2 diabetes mellitus without complications: Secondary | ICD-10-CM | POA: Insufficient documentation

## 2013-05-12 DIAGNOSIS — I209 Angina pectoris, unspecified: Secondary | ICD-10-CM

## 2013-05-12 DIAGNOSIS — J4489 Other specified chronic obstructive pulmonary disease: Secondary | ICD-10-CM | POA: Insufficient documentation

## 2013-05-12 DIAGNOSIS — Z79899 Other long term (current) drug therapy: Secondary | ICD-10-CM | POA: Insufficient documentation

## 2013-05-12 DIAGNOSIS — Z951 Presence of aortocoronary bypass graft: Secondary | ICD-10-CM | POA: Insufficient documentation

## 2013-05-12 DIAGNOSIS — Z794 Long term (current) use of insulin: Secondary | ICD-10-CM | POA: Insufficient documentation

## 2013-05-12 DIAGNOSIS — I5043 Acute on chronic combined systolic (congestive) and diastolic (congestive) heart failure: Secondary | ICD-10-CM

## 2013-05-12 DIAGNOSIS — I200 Unstable angina: Secondary | ICD-10-CM

## 2013-05-12 DIAGNOSIS — E785 Hyperlipidemia, unspecified: Secondary | ICD-10-CM | POA: Insufficient documentation

## 2013-05-12 DIAGNOSIS — I2589 Other forms of chronic ischemic heart disease: Secondary | ICD-10-CM | POA: Insufficient documentation

## 2013-05-12 DIAGNOSIS — J449 Chronic obstructive pulmonary disease, unspecified: Secondary | ICD-10-CM | POA: Insufficient documentation

## 2013-05-12 DIAGNOSIS — I255 Ischemic cardiomyopathy: Secondary | ICD-10-CM | POA: Diagnosis present

## 2013-05-12 DIAGNOSIS — I129 Hypertensive chronic kidney disease with stage 1 through stage 4 chronic kidney disease, or unspecified chronic kidney disease: Secondary | ICD-10-CM | POA: Insufficient documentation

## 2013-05-12 DIAGNOSIS — N183 Chronic kidney disease, stage 3 unspecified: Secondary | ICD-10-CM | POA: Insufficient documentation

## 2013-05-12 DIAGNOSIS — G4733 Obstructive sleep apnea (adult) (pediatric): Secondary | ICD-10-CM | POA: Insufficient documentation

## 2013-05-12 DIAGNOSIS — I5042 Chronic combined systolic (congestive) and diastolic (congestive) heart failure: Secondary | ICD-10-CM | POA: Insufficient documentation

## 2013-05-12 DIAGNOSIS — I2510 Atherosclerotic heart disease of native coronary artery without angina pectoris: Principal | ICD-10-CM | POA: Insufficient documentation

## 2013-05-12 LAB — COMPREHENSIVE METABOLIC PANEL
ALT: 80 U/L — ABNORMAL HIGH (ref 0–53)
AST: 85 U/L — ABNORMAL HIGH (ref 0–37)
Albumin: 3.7 g/dL (ref 3.5–5.2)
Alkaline Phosphatase: 80 U/L (ref 39–117)
BUN: 34 mg/dL — ABNORMAL HIGH (ref 6–23)
CO2: 31 mEq/L (ref 19–32)
Calcium: 10.1 mg/dL (ref 8.4–10.5)
Chloride: 92 mEq/L — ABNORMAL LOW (ref 96–112)
Creatinine, Ser: 1.29 mg/dL (ref 0.50–1.35)
GFR calc Af Amer: 60 mL/min — ABNORMAL LOW (ref >90)
GFR calc non Af Amer: 51 mL/min — ABNORMAL LOW (ref >90)
Glucose, Bld: 170 mg/dL — ABNORMAL HIGH (ref 70–99)
Potassium: 4.4 mEq/L (ref 3.5–5.1)
Sodium: 135 mEq/L (ref 135–145)
Total Bilirubin: 0.5 mg/dL (ref 0.3–1.2)
Total Protein: 7.2 g/dL (ref 6.0–8.3)

## 2013-05-12 LAB — PROTIME-INR
INR: 0.99 (ref 0.00–1.49)
Prothrombin Time: 12.9 seconds (ref 11.6–15.2)

## 2013-05-12 LAB — CBC WITH DIFFERENTIAL/PLATELET
Basophils Absolute: 0 10*3/uL (ref 0.0–0.1)
Basophils Relative: 0 % (ref 0–1)
Eosinophils Absolute: 0.1 10*3/uL (ref 0.0–0.7)
Eosinophils Relative: 2 % (ref 0–5)
HCT: 43.1 % (ref 39.0–52.0)
Hemoglobin: 14.7 g/dL (ref 13.0–17.0)
Lymphocytes Relative: 26 % (ref 12–46)
Lymphs Abs: 2 10*3/uL (ref 0.7–4.0)
MCH: 31.7 pg (ref 26.0–34.0)
MCHC: 34.1 g/dL (ref 30.0–36.0)
MCV: 92.9 fL (ref 78.0–100.0)
Monocytes Absolute: 0.9 10*3/uL (ref 0.1–1.0)
Monocytes Relative: 11 % (ref 3–12)
Neutro Abs: 4.7 10*3/uL (ref 1.7–7.7)
Neutrophils Relative %: 61 % (ref 43–77)
Platelets: 75 10*3/uL — ABNORMAL LOW (ref 150–400)
RBC: 4.64 MIL/uL (ref 4.22–5.81)
RDW: 14.1 % (ref 11.5–15.5)
WBC: 7.8 10*3/uL (ref 4.0–10.5)

## 2013-05-12 LAB — POCT I-STAT, CHEM 8
BUN: 35 mg/dL — ABNORMAL HIGH (ref 6–23)
Calcium, Ion: 1.13 mmol/L (ref 1.13–1.30)
Chloride: 96 mEq/L (ref 96–112)
Creatinine, Ser: 1.6 mg/dL — ABNORMAL HIGH (ref 0.50–1.35)
Glucose, Bld: 231 mg/dL — ABNORMAL HIGH (ref 70–99)
HCT: 46 % (ref 39.0–52.0)
Hemoglobin: 15.6 g/dL (ref 13.0–17.0)
Potassium: 5.1 mEq/L (ref 3.5–5.1)
Sodium: 134 mEq/L — ABNORMAL LOW (ref 135–145)
TCO2: 28 mmol/L (ref 0–100)

## 2013-05-12 LAB — POCT I-STAT TROPONIN I: Troponin i, poc: 0.02 ng/mL (ref 0.00–0.08)

## 2013-05-12 LAB — APTT: aPTT: 25 seconds (ref 24–37)

## 2013-05-12 LAB — GLUCOSE, CAPILLARY: Glucose-Capillary: 173 mg/dL — ABNORMAL HIGH (ref 70–99)

## 2013-05-12 LAB — TROPONIN I
Troponin I: <0.3 ng/mL (ref <0.30)
Troponin I: <0.3 ng/mL (ref <0.30)

## 2013-05-12 LAB — PRO B NATRIURETIC PEPTIDE: Pro B Natriuretic peptide (BNP): 335.6 pg/mL (ref 0–450)

## 2013-05-12 MED ORDER — INSULIN ASPART 100 UNIT/ML ~~LOC~~ SOLN: 0.0000 [IU] | Freq: Every day | SUBCUTANEOUS | Status: DC

## 2013-05-12 MED ORDER — INSULIN NPH (HUMAN) (ISOPHANE) 100 UNIT/ML ~~LOC~~ SUSP
15.0000 [IU] | Freq: Two times a day (BID) | SUBCUTANEOUS | Status: DC
  Filled 2013-05-12: qty 10

## 2013-05-12 MED ORDER — HEPARIN (PORCINE) IN NACL 100-0.45 UNIT/ML-% IJ SOLN
1250.0000 [IU]/h | INTRAMUSCULAR | Status: DC
Start: 2013-05-12 — End: 2013-05-13
  Administered 2013-05-12: 1250 [IU]/h via INTRAVENOUS
  Filled 2013-05-12 (×2): qty 250

## 2013-05-12 MED ORDER — INSULIN NPH (HUMAN) (ISOPHANE) 100 UNIT/ML ~~LOC~~ SUSP
15.0000 [IU] | Freq: Two times a day (BID) | SUBCUTANEOUS | Status: DC
  Administered 2013-05-12: 15 [IU] via SUBCUTANEOUS
  Filled 2013-05-12: qty 10

## 2013-05-12 MED ORDER — FUROSEMIDE 40 MG PO TABS
40.0000 mg | ORAL_TABLET | Freq: Two times a day (BID) | ORAL | Status: DC
Start: 2013-05-12 — End: 2013-05-13
  Administered 2013-05-12: 40 mg via ORAL
  Filled 2013-05-12 (×4): qty 1

## 2013-05-12 MED ORDER — SODIUM CHLORIDE 0.9 % IV SOLN: 250.0000 mL | INTRAVENOUS | Status: DC | PRN

## 2013-05-12 MED ORDER — PANTOPRAZOLE SODIUM 40 MG PO TBEC
40.0000 mg | DELAYED_RELEASE_TABLET | Freq: Every day | ORAL | Status: DC
  Administered 2013-05-13: 40 mg via ORAL
  Filled 2013-05-12: qty 1

## 2013-05-12 MED ORDER — SODIUM CHLORIDE 0.9 % IJ SOLN: 3.0000 mL | INTRAMUSCULAR | Status: DC | PRN

## 2013-05-12 MED ORDER — LISINOPRIL 5 MG PO TABS
5.0000 mg | ORAL_TABLET | Freq: Every day | ORAL | Status: DC
  Administered 2013-05-13: 5 mg via ORAL
  Filled 2013-05-12: qty 1

## 2013-05-12 MED ORDER — ONDANSETRON HCL 4 MG/2ML IJ SOLN: 4.0000 mg | Freq: Four times a day (QID) | INTRAMUSCULAR | Status: DC | PRN

## 2013-05-12 MED ORDER — SIMVASTATIN 10 MG PO TABS
10.0000 mg | ORAL_TABLET | Freq: Every day | ORAL | Status: DC
  Administered 2013-05-12: 10 mg via ORAL
  Filled 2013-05-12 (×2): qty 1

## 2013-05-12 MED ORDER — ACETAMINOPHEN 325 MG PO TABS
650.0000 mg | ORAL_TABLET | ORAL | Status: DC | PRN
  Administered 2013-05-13: 650 mg via ORAL
  Filled 2013-05-12: qty 2

## 2013-05-12 MED ORDER — SODIUM CHLORIDE 0.9 % IJ SOLN: 3.0000 mL | Freq: Two times a day (BID) | INTRAMUSCULAR | Status: DC

## 2013-05-12 MED ORDER — COLCHICINE 0.6 MG PO TABS
0.6000 mg | ORAL_TABLET | Freq: Three times a day (TID) | ORAL | Status: DC | PRN
  Administered 2013-05-12 – 2013-05-13 (×2): 0.6 mg via ORAL
  Filled 2013-05-12 (×2): qty 1

## 2013-05-12 MED ORDER — NITROGLYCERIN 0.4 MG SL SUBL: 0.4000 mg | SUBLINGUAL_TABLET | SUBLINGUAL | Status: DC | PRN

## 2013-05-12 MED ORDER — HEPARIN BOLUS VIA INFUSION
4000.0000 [IU] | Freq: Once | INTRAVENOUS | Status: AC
  Administered 2013-05-12: 4000 [IU] via INTRAVENOUS
  Filled 2013-05-12: qty 4000

## 2013-05-12 MED ORDER — INSULIN ASPART 100 UNIT/ML ~~LOC~~ SOLN
0.0000 [IU] | Freq: Three times a day (TID) | SUBCUTANEOUS | Status: DC
  Administered 2013-05-13: 11 [IU] via SUBCUTANEOUS

## 2013-05-12 MED ORDER — CLOPIDOGREL BISULFATE 75 MG PO TABS
75.0000 mg | ORAL_TABLET | Freq: Every day | ORAL | Status: DC
  Administered 2013-05-13: 75 mg via ORAL
  Filled 2013-05-12: qty 1

## 2013-05-12 MED ORDER — CARVEDILOL 12.5 MG PO TABS
12.5000 mg | ORAL_TABLET | Freq: Two times a day (BID) | ORAL | Status: DC
  Filled 2013-05-12 (×3): qty 1

## 2013-05-12 MED ORDER — FAMOTIDINE 20 MG PO TABS
20.0000 mg | ORAL_TABLET | Freq: Every day | ORAL | Status: DC
Start: 2013-05-12 — End: 2013-05-13
  Administered 2013-05-12: 20 mg via ORAL
  Filled 2013-05-12 (×2): qty 1

## 2013-05-12 MED ORDER — ALLOPURINOL 100 MG PO TABS
200.0000 mg | ORAL_TABLET | Freq: Every day | ORAL | Status: DC
  Administered 2013-05-13: 200 mg via ORAL
  Filled 2013-05-12: qty 2

## 2013-05-12 MED ORDER — ASPIRIN 81 MG PO CHEW
81.0000 mg | CHEWABLE_TABLET | Freq: Every day | ORAL | Status: DC
  Administered 2013-05-13: 81 mg via ORAL
  Filled 2013-05-12: qty 1

## 2013-05-12 NOTE — Progress Notes (Signed)
ANTICOAGULATION CONSULT NOTE - Initial Consult  Pharmacy Consult for Heparin Indication: chest pain/ACS  No Known Allergies  Patient Measurements: Height: 6' (182.9 cm) Weight: 200 lb (90.719 kg) IBW/kg (Calculated) : 77.6   Vital Signs: Temp: 98.2 F (36.8 C) (08/02 1426) Temp src: Oral (08/02 1426) BP: 110/94 mmHg (08/02 1745) Pulse Rate: 59 (08/02 1745)  Labs:  Recent Labs  05/12/13 1438 05/12/13 1450 05/12/13 1627  HGB 14.7 15.6  --   HCT 43.1 46.0  --   PLT 75*  --   --   CREATININE  --  1.60*  --   TROPONINI  --   --  <0.30    Estimated Creatinine Clearance: 41.8 ml/min (by C-G formula based on Cr of 1.6).   Medical History: Past Medical History  Diagnosis Date  . Hypertension   . Hyperkalemia   . Thrombocytopenia   . Dyslipidemia   . Arthritis   . Gout   . CAD (coronary artery disease)     3v CABG 1997; PCI of SVG to PL '05; PCI SVG to PL & SVG to PDA '11; Cath 08/2012 patent grafts  . Sleep apnea   . Cataract   . Chronic combined systolic and diastolic CHF (congestive heart failure)     EF 50-55% 11/2011; EF 25-30% 08/2012  . Diabetes mellitus     insulin dependent  . Chronic kidney disease     Stage3  . GERD (gastroesophageal reflux disease)   . Gout   . UTI (lower urinary tract infection) 08/2012    E.coli     Assessment: 80yom with hx CAD/CABG -multiple PCIs since CABG -today developed CP with radiation to L arm.  First tp is negative, H/H stable pltc low but stable - last 48mo of CBC shows pltc 70-80.  Begin heparin drip.   Goal of Therapy:  Heparin level 0.3-0.7 units/ml Monitor platelets by anticoagulation protocol: Yes   Plan:  Heparin bolus 4000 uts IV x1 Heparin drip 1250 uts/hr Daily HL and CBC  Bonnita Nasuti Pharm.D. CPP, BCPS Clinical Pharmacist [phone] 05/12/2013 7:09 PM

## 2013-05-12 NOTE — H&P (Signed)
History and Physical   Patient ID: Scott [name], MRN: [id number], DOB: January 23, 1933   Date of Encounter: 05/12/2013, 4:28 PM  Primary Physician: Hilbert Corrigan, MD Cardiologist: Dr. Peter Martinique   Chief Complaint:  Chest Pain  History of Present Illness: Scott [name] is a 79 y.o. male with a hx of CAD, s/p CABG and multiple PCIs since, DM2, HTN, HL, combined systolic and diastolic CHF as well as right-sided heart failure, gout, CKD. He was admitted in 08/2012 with severe right-sided heart failure. His ejection fraction was previously normal but was now reduced. Echo 08/2012: Mild LVH, EF 25-30%, trivial AI, MAC, mild MR, moderate BAE, mild RVE, moderately reduced RVSF, moderate TR, PASP 77. Because of his reduced EF, he was set up for a right and left-sided heart catheterization. This demonstrated that the LAD and RCA was occluded, proximal circumflex with 50%, SVG-PDA was patent with patent stents, SVG-PLA patent with mild proximal ISR, LIMA-LAD patent. He was last seen in the office 05/02/13 by Dr. Martinique. He had recently had his diuresis increased for volume overload. He was in improved condition and continued on his current dose of diuretic. He was otherwise in his usual state of health. He works out at [employer] 3 times a week swimming and riding a stationary bike. He denies exertional chest pain or shortness of breath. His weights at home have been stable. Today, shortly after eating lunch he developed what he thought was indigestion. He took Maalox without relief. His symptoms continued to worsen and felt like a heaviness. He then had associated left arm numbness and diaphoresis and slight shortness of breath. This seemed similar to previous anginal symptoms. He took nitroglycerin without relief. He called 911. EMS gave him one more nitroglycerin tablet as well as 4 baby aspirin. Shortly after this his symptoms resolved. Pain lasted for a total of 30-40 minutes. He has not had a recurrence of pain. Initial  ECG is similar to prior electrocardiograms. Initial troponin is negative. Initial chest x-ray without acute findings. Cardiology is asked to further evaluate.   Past Medical History  Diagnosis Date  . Hypertension   . Hyperkalemia   . Thrombocytopenia   . Dyslipidemia   . Arthritis   . Gout   . CAD (coronary artery disease)     3v CABG 1997; PCI of SVG to PL '05; PCI SVG to PL & SVG to PDA '11; Cath 08/2012 patent grafts  . Sleep apnea   . Cataract   . Chronic combined systolic and diastolic CHF (congestive heart failure)     EF 50-55% 11/2011; EF 25-30% 08/2012  . Diabetes mellitus     insulin dependent  . Chronic kidney disease     Stage3  . GERD (gastroesophageal reflux disease)   . Gout   . UTI (lower urinary tract infection) 08/2012    E.coli     Past Surgical History  Procedure Laterality Date  . Coronary artery bypass graft  1997     (LIMA to LAD, SVG to PDA, SVG to PL)  . Cardiac catheterization  2005    stenting of the vein graft to the posterior lateral per -- Dr. Martinique      . Cardiac catheterization  2011    showed right coronary, totally occluded LAD and severe stenosis in both saphenous vein graft to the posterior lateral and posterior descending  -- stenting of the proximal portion of the SVG to the posterior lateral branch in 2/11  . Appendectomy    .  Cholecystectomy    . Back surgery      15 years ago  . Partial colectomy      cancerous polyps      No current facility-administered medications for this encounter.   Current Outpatient Prescriptions  Medication Sig Dispense Refill  . allopurinol (ZYLOPRIM) 100 MG tablet Take 2 tablets (200 mg total) by mouth daily.  60 tablet  1  . aspirin 81 MG EC tablet Take 81 mg by mouth daily.        . carvedilol (COREG) 12.5 MG tablet Take 1 tablet (12.5 mg total) by mouth 2 (two) times daily with a meal.  60 tablet  6  . clopidogrel (PLAVIX) 75 MG tablet Take 75 mg by mouth daily.      . colchicine 0.6 MG  tablet Take 0.6 mg by mouth 3 (three) times daily as needed. For gout pain      . Cyanocobalamin (VITAMIN B-12 PO) Take 1 tablet by mouth daily.      . famotidine (PEPCID) 20 MG tablet Take 20 mg by mouth at bedtime.      . furosemide (LASIX) 40 MG tablet Take 40 mg by mouth 2 (two) times daily.       . insulin NPH (HUMULIN N,NOVOLIN N) 100 UNIT/ML injection Inject 15 Units into the skin 2 (two) times daily before a meal.      . insulin regular (NOVOLIN R,HUMULIN R) 100 units/mL injection Inject 10 Units into the skin 2 (two) times daily before a meal.      . lisinopril (PRINIVIL,ZESTRIL) 5 MG tablet Take 1 tablet (5 mg total) by mouth daily.  30 tablet  6  . Multiple Vitamin (MULTIVITAMIN) tablet Take 1 tablet by mouth daily.        . nitroGLYCERIN (NITROSTAT) 0.4 MG SL tablet Place 1 tablet (0.4 mg total) under the tongue every 5 (five) minutes as needed for chest pain (up to 3 doses).  25 tablet  3  . pantoprazole (PROTONIX) 40 MG tablet Take 1 tablet (40 mg total) by mouth daily.  30 tablet  2  . simvastatin (ZOCOR) 20 MG tablet Take 10 mg by mouth at bedtime.            Allergies: No Known Allergies   Social History:  The patient  reports that he quit smoking about 29 years ago. His smoking use included Cigarettes. He has a 52.5 pack-year smoking history. He has never used smokeless tobacco. He reports that  drinks alcohol. He reports that he does not use illicit drugs.   Family History:  The patient's family history includes Asthma in his brother; Cardiomyopathy in his mother; Coronary artery disease in his brother and father; Heart attack in his father; and Kidney disease in his sister.   ROS:  Please see the history of present illness.     All other systems reviewed and negative.   Vital Signs: Blood pressure 147/60, pulse 56, temperature 98.2 F (36.8 C), temperature source Oral, resp. rate 17, height 6' (1.829 m), weight 200 lb (90.719 kg), SpO2 98.00%.  PHYSICAL EXAM: General:   Well nourished, well developed, in no acute distress HEENT: normal Lymph: no adenopathy Neck: no JVD Endocrine:  No thryomegaly Vascular: No carotid bruits  Cardiac:  normal S1, S2; RRR; no murmur Lungs:  clear to auscultation bilaterally, no wheezing, rhonchi or rales Abd: soft, nontender, no hepatomegaly Ext: no edema Musculoskeletal:  No deformities, BUE and BLE strength normal and equal Skin: warm  and dry Neuro:  CNs 2-12 intact, no focal abnormalities noted Psych:  Normal affect    EKG:  NSR, HR 54, interventricular conduction delay, T-wave inversions in 1, aVL, V2-V6  Labs:   Lab Results  Component Value Date   WBC 7.8 05/12/2013   HGB 15.6 05/12/2013   HCT 46.0 05/12/2013   MCV 92.9 05/12/2013   PLT 75* 05/12/2013       Recent Labs Lab 05/12/13 1450  NA 134*  K 5.1  CL 96  BUN 35*  CREATININE 1.60*  GLUCOSE 231*    Troponin I   0.02   Radiology/Studies:   Dg Chest 2 View  05/12/2013     IMPRESSION: Stable mild cardiomegaly and COPD.  No acute cardiopulmonary disease.  Probable early interstitial fibrosis in the lower lobes.   Original Report Authenticated By: Evangeline Dakin, M.D.      ASSESSMENT AND PLAN:   1. Unstable Angina:  Symptoms concerning for [place].  However, he had stable anatomy < 12 mos ago by cath.  His symptoms lasted 30-40 mins.  Will admit for obs.  Continue ASA, beta blocker, statin.  Start IV heparin.  If he rules out, could consider inpatient myoview for risk stratification.  If he rules in, will need cath if renal fxn remains stable. 2. CAD:  Continue ASA, statin, beta blocker.  Cycle enzymes as noted.   3. Combined Systolic and Diastolic CHF:  Volume appears stable.  Continue current diuretic dose.  Continue beta blocker, ACE. 4. CKD:  Keep close eye on renal fxn. 5. Diabetes Mellitus:  Continue current regimen.  Cover with SSI. 6. GERD:  Continue PPI. 7. Hyperlipidemia:  Continue statin. 8. Hypertension:  Continue current regimen.    Signed,  Richardson Dopp, PA-C 05/12/2013 4:28 PM    Pager # [phone]  as above, patient seen and examined. Briefly he is a 79 year old male with past medical history of coronary artery disease status post coronary artery bypass and graft, diabetes mellitus, congestive heart failure, thrombocytopenia and renal insufficiency who presents with chest pain. Last cardiac catheterization in November of 2013 as outlined above. Medical therapy recommended. The patient had lunch today and approximately 15-30 minutes later developed substernal chest pain. It was described as a pressure and "indigestion". There was some left arm tingling. He had diaphoresis but no nausea or dyspnea. Note he finds it difficult to differentiate indigestion which he has occasionally from his cardiac pain. This pain was more severe and lasted approximately one hour. It did not improve with Maalox but did improve following 2 sublingual nitroglycerin. He is presently pain-free. Plan to admit and rule out myocardial infarction with serial enzymes. If his enzymes are abnormal she will most likely require cardiac catheterization. If normal I would favor a functional study. This particularly in light of catheterization in November of 2013 and medical therapy recommended, renal insufficiency and thrombocytopenia. Continue aspirin, beta blocker and statin. We'll treat with IV heparin until enzymes returned. Kirk Ruths 4:52 PM

## 2013-05-12 NOTE — ED Notes (Signed)
Pt presents to ED via EMS after having chest pain today. Patient was sitting at home when he started having chest pain, left arm pain, shortness of breath, diaphoretic and paleness. Patient took 1 sl nitro at home before calling EMS. EMS gave 2 more sl nitro, 324 baby asa. Patient pain free on arrival to ED. Patient states he feels like he did when he had heart attack. Patient has history of CABG cardiac stents. NAD.

## 2013-05-12 NOTE — ED Provider Notes (Signed)
CSN: [id number]     Arrival date & time 05/12/13  1422 History     None    Chief Complaint  Patient presents with  . Chest Pain   (Consider location/radiation/quality/duration/timing/severity/associated sxs/prior Treatment) Patient is a 79 y.o. male presenting with chest pain. The history is provided by the patient.  Chest Pain Pain location:  Substernal area Pain quality: pressure   Pain radiates to:  L arm Pain radiates to the back: no   Pain severity:  Moderate Onset quality:  Sudden Timing:  Rare Progression:  Resolved Chronicity:  Recurrent Relieved by:  Nitroglycerin and aspirin Worsened by:  Nothing tried Ineffective treatments:  None tried Associated symptoms: diaphoresis, shortness of breath and weakness   Associated symptoms: no abdominal pain, no back pain, no cough, no dysphagia, no fatigue, no fever, no headache, no nausea, no numbness and not vomiting     Past Medical History  Diagnosis Date  . Hypertension   . Hyperkalemia   . Thrombocytopenia   . Dyslipidemia   . Arthritis   . Gout   . CAD (coronary artery disease)     3v CABG 1997; PCI of SVG to PL '05; PCI SVG to PL & SVG to PDA '11; Cath 08/2012 patent grafts  . Sleep apnea   . Cataract   . Chronic combined systolic and diastolic CHF (congestive heart failure)     EF 50-55% 11/2011; EF 25-30% 08/2012  . Diabetes mellitus     insulin dependent  . Chronic kidney disease     Stage3  . GERD (gastroesophageal reflux disease)   . Gout   . UTI (lower urinary tract infection) 08/2012    E.coli   Past Surgical History  Procedure Laterality Date  . Coronary artery bypass graft  1997     (LIMA to LAD, SVG to PDA, SVG to PL)  . Cardiac catheterization  2005    stenting of the vein graft to the posterior lateral per -- Dr. Martinique      . Cardiac catheterization  2011    showed right coronary, totally occluded LAD and severe stenosis in both saphenous vein graft to the posterior lateral and posterior  descending  -- stenting of the proximal portion of the SVG to the posterior lateral branch in 2/11  . Appendectomy    . Cholecystectomy    . Back surgery      15 years ago  . Colon surgery      cancerous polyps  . Cardiac stents     Family History  Problem Relation Age of Onset  . Coronary artery disease Father   . Heart attack Father   . Coronary artery disease Brother     1/2 brother   . Kidney disease Sister   . Cardiomyopathy Mother   . Asthma Brother    History  Substance Use Topics  . Smoking status: Former Smoker -- 1.50 packs/day for 35 years    Types: Cigarettes    Quit date: 10/12/1983  . Smokeless tobacco: Never Used  . Alcohol Use: Yes     Comment: social only    Review of Systems  Constitutional: Positive for diaphoresis. Negative for fever, activity change, appetite change and fatigue.  HENT: Negative for congestion, sore throat, facial swelling, rhinorrhea, trouble swallowing, neck pain, neck stiffness, voice change and sinus pressure.   Eyes: Negative.   Respiratory: Positive for shortness of breath. Negative for cough, choking, chest tightness and wheezing.   Cardiovascular: Positive for chest pain.  Gastrointestinal: Negative for nausea, vomiting and abdominal pain.  Genitourinary: Negative for dysuria, urgency, frequency, hematuria, flank pain and difficulty urinating.  Musculoskeletal: Negative for back pain and gait problem.  Skin: Negative for rash and wound.  Neurological: Positive for weakness and light-headedness. Negative for facial asymmetry, numbness and headaches.  Psychiatric/Behavioral: Negative for behavioral problems, confusion and agitation. The patient is not nervous/anxious and is not hyperactive.   All other systems reviewed and are negative.    Allergies  Review of patient's allergies indicates no known allergies.  Home Medications   Current Outpatient Rx  Name  Route  Sig  Dispense  Refill  . allopurinol (ZYLOPRIM) 100 MG  tablet   Oral   Take 2 tablets (200 mg total) by mouth daily.   60 tablet   1   . aspirin 81 MG EC tablet   Oral   Take 81 mg by mouth daily.           . carvedilol (COREG) 12.5 MG tablet   Oral   Take 1 tablet (12.5 mg total) by mouth 2 (two) times daily with a meal.   60 tablet   6   . clopidogrel (PLAVIX) 75 MG tablet   Oral   Take 75 mg by mouth daily.         . colchicine 0.6 MG tablet   Oral   Take 0.6 mg by mouth 3 (three) times daily as needed. For gout pain         . Cyanocobalamin (VITAMIN B-12 PO)   Oral   Take 1 tablet by mouth daily.         . famotidine (PEPCID) 20 MG tablet   Oral   Take 20 mg by mouth at bedtime.         . furosemide (LASIX) 40 MG tablet   Oral   Take 40 mg by mouth 2 (two) times daily.          . insulin NPH (HUMULIN N,NOVOLIN N) 100 UNIT/ML injection   Subcutaneous   Inject 15 Units into the skin 2 (two) times daily before a meal.         . insulin regular (NOVOLIN R,HUMULIN R) 100 units/mL injection   Subcutaneous   Inject 10 Units into the skin 2 (two) times daily before a meal.         . lisinopril (PRINIVIL,ZESTRIL) 5 MG tablet   Oral   Take 1 tablet (5 mg total) by mouth daily.   30 tablet   6   . Multiple Vitamin (MULTIVITAMIN) tablet   Oral   Take 1 tablet by mouth daily.           . nitroGLYCERIN (NITROSTAT) 0.4 MG SL tablet   Sublingual   Place 1 tablet (0.4 mg total) under the tongue every 5 (five) minutes as needed for chest pain (up to 3 doses).   25 tablet   3   . pantoprazole (PROTONIX) 40 MG tablet   Oral   Take 1 tablet (40 mg total) by mouth daily.   30 tablet   2   . simvastatin (ZOCOR) 20 MG tablet   Oral   Take 10 mg by mouth at bedtime.            BP 147/60  Pulse 56  Temp(Src) 98.2 F (36.8 C) (Oral)  Resp 17  Ht 6' (1.829 m)  Wt 200 lb (90.719 kg)  BMI 27.12 kg/m2  SpO2 98% Physical Exam  Nursing note and vitals reviewed. Constitutional: He is oriented to  person, place, and time. He appears well-developed and well-nourished. No distress.  HENT:  Head: Normocephalic and atraumatic.  Right Ear: External ear normal.  Left Ear: External ear normal.  Mouth/Throat: No oropharyngeal exudate.  Eyes: Conjunctivae and EOM are normal. Pupils are equal, round, and reactive to light. Right eye exhibits no discharge. Left eye exhibits no discharge.  Neck: Normal range of motion. Neck supple. No JVD present. No tracheal deviation present. No thyromegaly present.  Cardiovascular: Regular rhythm, normal heart sounds and intact distal pulses.  Bradycardia present.  Exam reveals no gallop and no friction rub.   No murmur heard. Pulmonary/Chest: Effort normal and breath sounds normal. No respiratory distress. He has no wheezes. He exhibits no tenderness.  Abdominal: Soft. Bowel sounds are normal. He exhibits no distension. There is no tenderness. There is no rebound and no guarding.  Musculoskeletal: Normal range of motion. He exhibits no edema and no tenderness.  Lymphadenopathy:    He has no cervical adenopathy.  Neurological: He is alert and oriented to person, place, and time. No cranial nerve deficit.  Skin: Skin is warm and dry. No rash noted. He is not diaphoretic. No pallor.  Psychiatric: He has a normal mood and affect. His behavior is normal.    ED Course   Procedures (including critical care time)  Labs Reviewed  CBC WITH DIFFERENTIAL - Abnormal; Notable for the following:    Platelets 75 (*)    All other components within normal limits  POCT I-STAT, CHEM 8 - Abnormal; Notable for the following:    Sodium 134 (*)    BUN 35 (*)    Creatinine, Ser 1.60 (*)    Glucose, Bld 231 (*)    All other components within normal limits  TROPONIN I  PRO B NATRIURETIC PEPTIDE  POCT I-STAT TROPONIN I   Dg Chest 2 View  05/12/2013   *RADIOLOGY REPORT*  Clinical Data: Chest pain radiating into the left arm.  Shortness of breath.  Diaphoresis.  Dizziness.   Nausea.  CABG 1999.  Current history of diabetes and hypertension.  Former smoker.  CHEST - 2 VIEW  Comparison: Two-view chest x-ray 08/31/2012, 01/19/2012, 11/14/2009.  Findings: Prior sternotomy for CABG.  Cardiac silhouette mildly enlarged but stable.  Thoracic aorta tortuous atherosclerotic, unchanged.  Hilar and mediastinal contours otherwise unremarkable. Pulmonary vascularity normal without evidence of pulmonary edema. Increased interstitial markings in the lower lobes, unchanged. Mild central peribronchial thickening, unchanged.  No new pulmonary parenchymal abnormalities.  No pleural effusions.  Visualized bony thorax intact.  IMPRESSION: Stable mild cardiomegaly and COPD.  No acute cardiopulmonary disease.  Probable early interstitial fibrosis in the lower lobes.   Original Report Authenticated By: Evangeline Dakin, M.D.   No diagnosis found.  MDM  79 y.o. M with a PMH significant for CAD s/p CABG and PCI x 2, CHF with EF of 25-30%, CKD, DM, HTN here with concerning CP. It is substernal, associated with SOB, diaphoresis, radiates to left arm and similar to previous anginal events he has had in the past. Patient pain now resolved after getting two doses of nitro en route by EMS. EMS also gave aspirin. EKG shows lateral t wave depressions that are different that old EKG. Spoke with cardiology who will admit the patient. The istat troponin is currently negative.  EKG: normal sinus rhythm, ST depression in V4, V5, V6.   Case discussed with Dr. Tito Dine, MD 05/12/13  1559 

## 2013-05-12 NOTE — ED Provider Notes (Signed)
I saw and evaluated the patient, reviewed the resident's note and I agree with the findings and plan.   80yM with CP. Hx of CAD s/p 3 vessel CABG. Had cath 11/13 which showed patent grafts. Symptoms today concerning though. CP pressure with numbness in L arm, SOB and diaphoresis. Onset around 1300. Currently symptom free. Has EKG changes. ASA prehospital. Initial troponin normal. Dr Martinique cardiologist. Admit.   Virgel Manifold, MD 05/12/13 [id number]

## 2013-05-13 ENCOUNTER — Observation Stay (HOSPITAL_COMMUNITY): Payer: Medicare Other

## 2013-05-13 ENCOUNTER — Encounter (HOSPITAL_COMMUNITY): Payer: Self-pay | Admitting: Physician Assistant

## 2013-05-13 DIAGNOSIS — I2510 Atherosclerotic heart disease of native coronary artery without angina pectoris: Secondary | ICD-10-CM

## 2013-05-13 DIAGNOSIS — I200 Unstable angina: Secondary | ICD-10-CM

## 2013-05-13 DIAGNOSIS — I209 Angina pectoris, unspecified: Secondary | ICD-10-CM

## 2013-05-13 LAB — HEPARIN LEVEL (UNFRACTIONATED)
Heparin Unfractionated: 0.6 IU/mL (ref 0.30–0.70)
Heparin Unfractionated: 0.59 IU/mL (ref 0.30–0.70)

## 2013-05-13 LAB — GLUCOSE, CAPILLARY
Glucose-Capillary: 213 mg/dL — ABNORMAL HIGH (ref 70–99)
Glucose-Capillary: 251 mg/dL — ABNORMAL HIGH (ref 70–99)
Glucose-Capillary: 236 mg/dL — ABNORMAL HIGH (ref 70–99)

## 2013-05-13 LAB — TROPONIN I
Troponin I: <0.3 ng/mL (ref <0.30)
Troponin I: <0.3 ng/mL (ref <0.30)

## 2013-05-13 LAB — CBC
HCT: 43.6 % (ref 39.0–52.0)
Hemoglobin: 14.8 g/dL (ref 13.0–17.0)
MCH: 31.4 pg (ref 26.0–34.0)
MCHC: 33.9 g/dL (ref 30.0–36.0)
MCV: 92.6 fL (ref 78.0–100.0)
Platelets: 70 10*3/uL — ABNORMAL LOW (ref 150–400)
RBC: 4.71 MIL/uL (ref 4.22–5.81)
RDW: 14.1 % (ref 11.5–15.5)
WBC: 10.3 10*3/uL (ref 4.0–10.5)

## 2013-05-13 MED ORDER — TECHNETIUM TC 99M SESTAMIBI - CARDIOLITE
30.0000 | Freq: Once | INTRAVENOUS | Status: AC | PRN
  Administered 2013-05-13: 11:00:00 30 via INTRAVENOUS

## 2013-05-13 MED ORDER — TECHNETIUM TC 99M SESTAMIBI - CARDIOLITE
10.0000 | Freq: Once | INTRAVENOUS | Status: AC | PRN
  Administered 2013-05-13: 09:00:00 10 via INTRAVENOUS

## 2013-05-13 MED ORDER — REGADENOSON 0.4 MG/5ML IV SOLN
INTRAVENOUS | Status: AC
  Administered 2013-05-13: 0.4 mg
  Filled 2013-05-13: qty 5

## 2013-05-13 MED ORDER — REGADENOSON 0.4 MG/5ML IV SOLN
0.4000 mg | Freq: Once | INTRAVENOUS | Status: DC
  Filled 2013-05-13: qty 5

## 2013-05-13 MED ORDER — ATORVASTATIN CALCIUM 80 MG PO TABS: 80.0000 mg | ORAL_TABLET | Freq: Every day | ORAL | Status: DC

## 2013-05-13 MED ORDER — ISOSORBIDE MONONITRATE ER 30 MG PO TB24: 30.0000 mg | ORAL_TABLET | Freq: Every day | ORAL | Status: DC

## 2013-05-13 NOTE — Progress Notes (Signed)
Patient Name: Scott [name] Date of Encounter: 05/13/2013     Principal Problem:   Unstable angina Active Problems:   Hypertension   Dyslipidemia   CAD (coronary artery disease)   Diabetes mellitus, type II, insulin dependent   CKD (chronic kidney disease) stage 3, GFR 30-59 ml/min   Chronic combined systolic and diastolic heart failure    SUBJECTIVE: Feels well without chest pain or shortness of breath.   OBJECTIVE  Filed Vitals:   05/13/13 0900 05/13/13 1046 05/13/13 1049 05/13/13 1412  BP: 141/74 133/96 126/67 127/79  Pulse: 80 91  72  Temp:    98 F (36.7 C)  TempSrc:    Oral  Resp:    18  Height:      Weight:      SpO2:    96%   No intake or output data in the 24 hours ending 05/13/13 1630 Weight change:   PHYSICAL EXAM  General: Well developed, well nourished, in no acute distress. Head: Normocephalic, atraumatic, sclera non-icteric, no xanthomas, nares are without discharge.  Neck: Supple without bruits or JVD. Lungs:  Resp regular and unlabored, CTAB without appreciable wheezes, rales or rhonchi Heart: RRR no s3, s4, or murmurs. Abdomen: Soft, non-tender, non-distended, BS + x 4.  Msk: Prior sternotomy scar noted. Strength and tone appears normal for age. Extremities: No clubbing, cyanosis or edema. DP/PT/Radials 2+ and equal bilaterally. Neuro: Alert and oriented X 3. Moves all extremities spontaneously. Psych: Normal affect.    LABS:  Recent Labs     05/12/13  1438  05/12/13  1450  05/13/13  0150  WBC  7.8   --   10.3  HGB  14.7  15.6  14.8  HCT  43.1  46.0  43.6  MCV  92.9   --   92.6  PLT  75*   --   70*   Recent Labs Lab 05/12/13 1450 05/12/13 2009  NA 134* 135  K 5.1 4.4  CL 96 92*  CO2  --  31  BUN 35* 34*  CREATININE 1.60* 1.29  CALCIUM  --  10.1  PROT  --  7.2  BILITOT  --  0.5  ALKPHOS  --  80  ALT  --  80*  AST  --  85*  GLUCOSE 231* 170*   Recent Labs     05/12/13  2010  05/13/13  0150  05/13/13  0720  TROPONINI   <0.30  <0.30  <0.30    TELE: NSR, 70-80 bpm, rare PVCs  ECG: NSR, 75 bpm, ST depressions V3-V6, II, III, aVF, isolated 1 mm STE aVR (more prominent from 08/31/12 tracing), trace PVCs    Radiology/Studies:  Dg Chest 2 View  05/12/2013   *RADIOLOGY REPORT*  Clinical Data: Chest pain radiating into the left arm.  Shortness of breath.  Diaphoresis.  Dizziness.  Nausea.  CABG 1999.  Current history of diabetes and hypertension.  Former smoker.  CHEST - 2 VIEW  Comparison: Two-view chest x-ray 08/31/2012, 01/19/2012, 11/14/2009.  Findings: Prior sternotomy for CABG.  Cardiac silhouette mildly enlarged but stable.  Thoracic aorta tortuous atherosclerotic, unchanged.  Hilar and mediastinal contours otherwise unremarkable. Pulmonary vascularity normal without evidence of pulmonary edema. Increased interstitial markings in the lower lobes, unchanged. Mild central peribronchial thickening, unchanged.  No new pulmonary parenchymal abnormalities.  No pleural effusions.  Visualized bony thorax intact.  IMPRESSION: Stable mild cardiomegaly and COPD.  No acute cardiopulmonary disease.  Probable early interstitial fibrosis in the  lower lobes.   Original Report Authenticated By: Evangeline Dakin, M.D.   Nm Myocar Multi W/spect W/wall Motion / Ef  05/13/2013   *RADIOLOGY REPORT*  Clinical data: Unstable angina, CAD  NUCLEAR MEDICINE MYOCARDIAL PERFUSION IMAGING NUCLEAR MEDICINE LEFT VENTRICULAR WALL MOTION ANALYSIS NUCLEAR MEDICINE LEFT VENTRICULAR EJECTION FRACTION CALCULATION  Technique: Standard single day myocardial SPECT imaging was performed after resting intravenous injection of Tc-58m cardiolite. After intravenous infusion of Lexiscan (regadenoson) under supervision of cardiology staff, cardiolite was injected intravenously and standard myocardial SPECT imaging was performed. Quantitative gated imaging was also performed to evaluate left ventricular wall motion and estimate left ventricular ejection fraction.   Radiopharmaceutical: 10+30 mCi Tc61m cardiolite IV.  Comparison: None.  Findings:  The stress SPECT images demonstrate physiologic distribution of radiopharmaceutical. Rest images demonstrate no perfusion defects.  The gated stress SPECT images demonstrate normal left ventricular myocardial thickening.  No focal wall motion abnormality is seen.  Calculated left ventricular end-diastolic volume 123456, end- systolic volume [id number], ejection fraction of 44%.  IMPRESSION:  1. Negative for pharmacologic-stress induced ischemia.  2. Left ventricular ejection fraction 44%.   Original Report Authenticated By: Julian Hy, M.D.    Current Medications:  . allopurinol  200 mg Oral Daily  . aspirin  81 mg Oral Daily  . carvedilol  12.5 mg Oral BID WC  . clopidogrel  75 mg Oral Daily  . famotidine  20 mg Oral QHS  . furosemide  40 mg Oral BID  . insulin aspart  0-20 Units Subcutaneous TID WC  . insulin aspart  0-5 Units Subcutaneous QHS  . insulin NPH  15 Units Subcutaneous BID AC  . lisinopril  5 mg Oral Daily  . pantoprazole  40 mg Oral Daily  . regadenoson  0.4 mg Intravenous Once  . simvastatin  10 mg Oral QHS  . sodium chloride  3 mL Intravenous Q12H    ASSESSMENT AND PLAN:  79yo Caucasian male with PMHx s/f CAD (s/p CABG, multiple PCIs), ischemic CM/combined CHF, type 2 DM, HTN, HLD, CKD (stage III) and gout who was hospitalized yesterday for chest pain.   1. Chest pain, moderate-high risk for cardiac etiology- the patient has ruled out overnight. No further chest pain. He does have difficulty distinguishing from indigestion, but notes if it was heartburn, it was "the worst heartburn of my life." Lexiscan Myoview reveals no evidence of ischemia. EF 44%. EKG this AM is worrisome when comparing to 08/31/12 tracing- there is more prominent downsloping ST depressions in V4-V6, II, III, aVF and an isolated 1 mm STE in aVR. He does have 50% mid left main and 75% distal left main lesions on prior cath.  The cath film was reviewed by Dr. Acie Fredrickson. The patient's EKG changes were felt to represent diffuse CAD and potentially microvascular disease. Will plan to discharge on ASA, Plavix, BB, ACEi. Switch to high-potency statin (atorvastatin 80). Add long-acting nitrate and recommended use of NTG SL PRN for future episodes of chest pain. GI etiologies may also be contributing and he should continue Protonix and follow-up with his PCP. Continue PPI, H2 blocker.   2. Ischemic cardiomyopathy/combined CHF- euvolemic on exam. Continue ACEi/BB and home Lasix. EF 44% by Myoview today. Consider adding aldosterone antagonist in the outpatient setting if BP permits.   3. Type 2 DM- CBGs elevated this admission. Will resume outpatient diabetic regimen. Follow-up with PCP for ongoing management.   4. CKD, stage III- stable. Continue glycemic control to mitigate progressive diabetic nephropathy. Currently on ACEi.  Cr < 1.5.    5. Hypertension- well-controlled. Monitor as an outpatient with addition of Imdur.   6. Hyperlipidemia- switch simvastatin with atorvastatin. Check LFTs, lipid panel in 6 weeks.   Dispo- discharge today. Follow-up in office in 1-2 weeks, PCP 1 week. Medication changes as above.    Signed, R. Valeria Batman, PA-C 05/13/2013, 4:30 PM  Attending Note:   The patient was seen and examined.  Agree with assessment and plan as noted above.  Changes made to the above note as needed.  Pt appears to be stable.  I personally reviewed his ECG compared to his previous tracing from 2013.  I also reviewed the cath angiograms cines.    He is currently pain free.  The myoview did not reveal any significant ischemia.  It is possible that the somewhat worsened ECG changes are due to his severe native CAD.  The last cath revealed a patent SVG  to the PDA, SVG to PLSA, and IMA to LAD.  I do not think that a stenosis in one of these major branches would not be picked up on the myoview study and I think he would be  having signifcant symptoms.  Will continue current medications for now.  Follow up with [place] in the next several weeks.   Thayer Headings, Brooke Bonito., MD, [facility] 05/13/2013, 9:42 PM

## 2013-05-13 NOTE — Progress Notes (Signed)
ANTICOAGULATION CONSULT NOTE - Follow Up Consult  Pharmacy Consult for heparin Indication: USAP  Labs:  Recent Labs  05/12/13 1438 05/12/13 1450 05/12/13 1627 05/12/13 2009 05/12/13 2010 05/13/13 0150  HGB 14.7 15.6  --   --   --  14.8  HCT 43.1 46.0  --   --   --  43.6  PLT 75*  --   --   --   --  70*  APTT  --   --   --  25  --   --   LABPROT  --   --   --  12.9  --   --   INR  --   --   --  0.99  --   --   HEPARINUNFRC  --   --   --   --   --  0.60  CREATININE  --  1.60*  --  1.29  --   --   TROPONINI  --   --  <0.30  --  <0.30  --     Assessment/Plan:  79yo male therapeutic on heparin with initial dosing for USAP.  Will continue gtt at current rate and confirm stable with next CE.  Wynona Neat, PharmD, BCPS  05/13/2013,2:23 AM

## 2013-05-13 NOTE — Discharge Summary (Signed)
Discharge Summary   Patient ID: Scott [name],  MRN: [id number], DOB/AGE: [birth year] 79 y.o.  Admit date: 05/12/2013 Discharge date: 05/13/2013  Primary Physician: Hilbert Corrigan, MD Primary Cardiologist: P. Martinique, MD  Discharge Diagnoses Principal Problem:   Unstable angina Active Problems:   CAD (coronary artery disease)   Chronic combined systolic and diastolic heart failure   Diabetes mellitus, type II, insulin dependent   CKD (chronic kidney disease) stage 3, GFR 30-59 ml/min   Hypertension   Ischemic cardiomyopathy   Dyslipidemia   OSA (obstructive sleep apnea)   Allergies No Known Allergies  Diagnostic Studies/Procedures  PA/LATERAL CHEST X-RAY - 05/12/13  IMPRESSION:  Stable mild cardiomegaly and COPD. No acute cardiopulmonary disease. Probable early interstitial fibrosis in the lower lobes.   LEXISCAN MYOVIEW - 05/13/13  IMPRESSION:  1. Negative for pharmacologic-stress induced ischemia.  2. Left ventricular ejection fraction 44%.  History of Present Illness Scott [name] is a 79 y.o. male who was observed overnight at [facility] from 8/2 to 05/13/13 with the above problem list.   79yo Caucasian male with PMHx s/f CAD (s/p CABG, multiple PCIs), ischemic CM/combined CHF, type 2 DM, HTN, HLD, CKD (stage III) and gout. He was admitted 08/2012 with severe right-sided heart failure and newly reduced EF (25-30%). He underwent right and left heart catheterization revealing occluded LAD, RCA, 50% proximal LCx and patent grafts. He followup with Dr. Martinique last month and was noted to be stable from a cardiac standpoint. He is quite active exercising at the [facility] 3 times a week (swimming and riding a stationary bike). He had not experienced limitation to his functional capacity.  The date of admission, he experienced substernal chest pressure which he attributed to indigestion. He took Maalox without relief. His symptoms continued to worsen and experienced associated left arm  numbness, diaphoresis and shortness of breath. This was numbness in his prior anginal symptoms. He took after glycerin without relief. EMS was called he was transported to the ED.  There, EKG revealed no evidence of ischemia. Initial troponin I returned within normal limits. Chest x-ray as above indicated no acute findings. There were changes consistent with COPD and early findings of pulmonary fibrosis. Given his cardiac history, cardiac risk factors and chest pain description consistent with unstable angina, the patient was admitted for observation and started on heparin.  Hospital Course   Three subsequent sets of troponin I returned within normal limits. He remained asymptomatic overnight. He was set up for [facility] the following morning. As above, this indicated no evidence of ischemia or infarction; EF of 44%. He was rounded on later in the afternoon. EKG was compared to November of last year. There were more prominent downsloping ST depressions V3 to V6, 2, 3, aVF and an isolated 1 mm ST elevation in aVR. This finding was discussed with Dr. nausea or personally reviewed the patient's prior cath films. The changes represented on the patient's EKG was suspected to be secondary to diffuse coronary disease. Additionally, the patient's episode of chest pain was attributed to potentially microvascular disease. He was deemed to be overall stable from a cardiac standpoint and discharged home.   He will followup as detailed below. He will continue the medication regimen outlined below. Imdur will be added for antianginal effect. Simvastatin will be replaced with hypodensity atorvastatin. LFTs and lipid panel should be checked in 6 weeks. Additionally, he was advised to followup with his PCP to rule out GI etiologies of his chest pain. He  did remain euvolemic this admission. Blood sugars were elevated. He will continue his outpatient insulin regimen and followup with his PCP. This information has been  clearly outlined in the discharge AVS.  Discharge Vitals:  Blood pressure 127/79, pulse 72, temperature 98 F (36.7 C), temperature source Oral, resp. rate 18, height 6' (1.829 m), weight 90.719 kg (200 lb), SpO2 96.00%.   Labs: Recent Labs     05/12/13  1438  05/12/13  1450  05/13/13  0150  WBC  7.8   --   10.3  HGB  14.7  15.6  14.8  HCT  43.1  46.0  43.6  MCV  92.9   --   92.6  PLT  75*   --   70*    Recent Labs Lab 05/12/13 1450 05/12/13 2009  NA 134* 135  K 5.1 4.4  CL 96 92*  CO2  --  31  BUN 35* 34*  CREATININE 1.60* 1.29  CALCIUM  --  10.1  PROT  --  7.2  BILITOT  --  0.5  ALKPHOS  --  80  ALT  --  80*  AST  --  85*  GLUCOSE 231* 170*   Recent Labs     05/12/13  2010  05/13/13  0150  05/13/13  0720  TROPONINI  <0.30  <0.30  <0.30   Disposition:  Discharge Orders   Future Appointments Provider Department Dept Phone   02/22/2014 11:00 AM Kathee Delton, MD [facility] [phone]   Future Orders Complete By Expires     Diet - low sodium heart healthy  As directed           Follow-up Information   Follow up with [facility]. (Office will call you with an appointment date & time in 1-2 weeks. )    Contact information:   [place]  [phone]       Discharge Medications:    Medication List    STOP taking these medications       simvastatin 20 MG tablet  Commonly known as:  ZOCOR      TAKE these medications       allopurinol 100 MG tablet  Commonly known as:  ZYLOPRIM  Take 2 tablets (200 mg total) by mouth daily.     aspirin 81 MG EC tablet  Take 81 mg by mouth daily.     atorvastatin 80 MG tablet  Commonly known as:  LIPITOR  Take 1 tablet (80 mg total) by mouth daily.     carvedilol 12.5 MG tablet  Commonly known as:  COREG  Take 1 tablet (12.5 mg total) by mouth 2 (two) times daily with a meal.     clopidogrel 75 MG tablet  Commonly known as:  PLAVIX  Take 75 mg by mouth  daily.     colchicine 0.6 MG tablet  Take 0.6 mg by mouth 3 (three) times daily as needed. For gout pain     famotidine 20 MG tablet  Commonly known as:  PEPCID  Take 20 mg by mouth at bedtime.     furosemide 40 MG tablet  Commonly known as:  LASIX  Take 40 mg by mouth 2 (two) times daily.     insulin NPH 100 UNIT/ML injection  Commonly known as:  HUMULIN N,NOVOLIN N  Inject 15 Units into the skin 2 (two) times daily before a meal.     insulin regular 100 units/mL injection  Commonly known  as:  NOVOLIN R,HUMULIN R  Inject 10 Units into the skin 2 (two) times daily before a meal.     isosorbide mononitrate 30 MG 24 hr tablet  Commonly known as:  IMDUR  Take 1 tablet (30 mg total) by mouth daily.     lisinopril 5 MG tablet  Commonly known as:  PRINIVIL,ZESTRIL  Take 1 tablet (5 mg total) by mouth daily.     multivitamin tablet  Take 1 tablet by mouth daily.     nitroGLYCERIN 0.4 MG SL tablet  Commonly known as:  NITROSTAT  Place 1 tablet (0.4 mg total) under the tongue every 5 (five) minutes as needed for chest pain (up to 3 doses).     pantoprazole 40 MG tablet  Commonly known as:  PROTONIX  Take 1 tablet (40 mg total) by mouth daily.     VITAMIN B-12 PO  Take 1 tablet by mouth daily.       Outstanding Labs/Studies: LFTs, lipid panel in 6 weeks  Duration of Discharge Encounter: Greater than 30 minutes including physician time.  Signed, R. Valeria Batman, PA-C 05/13/2013, 7:11 PM   Attending Note:   The patient was seen and examined.  Agree with assessment and plan as noted above.  Changes made to the above note as needed.  See progress note.  He is stable for DC  Ramond Dial., MD, [facility] 05/13/2013, 9:46 PM

## 2013-05-13 NOTE — Progress Notes (Signed)
ANTICOAGULATION CONSULT NOTE - Initial Consult  Pharmacy Consult for Heparin Indication: chest pain/ACS  No Known Allergies  Patient Measurements: Height: 6' (182.9 cm) Weight: 200 lb (90.719 kg) IBW/kg (Calculated) : 77.6   Vital Signs: Temp: 98.5 F (36.9 C) (08/03 0446) Temp src: Oral (08/03 0200) BP: 152/72 mmHg (08/03 0446) Pulse Rate: 86 (08/03 0446)  Labs:  Recent Labs  05/12/13 1438 05/12/13 1450  05/12/13 2009 05/12/13 2010 05/13/13 0150 05/13/13 0720  HGB 14.7 15.6  --   --   --  14.8  --   HCT 43.1 46.0  --   --   --  43.6  --   PLT 75*  --   --   --   --  70*  --   APTT  --   --   --  25  --   --   --   LABPROT  --   --   --  12.9  --   --   --   INR  --   --   --  0.99  --   --   --   HEPARINUNFRC  --   --   --   --   --  0.60 0.59  CREATININE  --  1.60*  --  1.29  --   --   --   TROPONINI  --   --   < >  --  <0.30 <0.30 <0.30  < > = values in this interval not displayed.  Estimated Creatinine Clearance: 51.8 ml/min (by C-G formula based on Cr of 1.29).   Medical History: Past Medical History  Diagnosis Date  . Hypertension   . Hyperkalemia   . Thrombocytopenia   . Dyslipidemia   . Arthritis   . Gout   . CAD (coronary artery disease)     3v CABG 1997; PCI of SVG to PL '05; PCI SVG to PL & SVG to PDA '11; Cath 08/2012 patent grafts  . Sleep apnea   . Cataract   . Chronic combined systolic and diastolic CHF (congestive heart failure)     EF 50-55% 11/2011; EF 25-30% 08/2012  . Diabetes mellitus     insulin dependent  . Chronic kidney disease     Stage3  . GERD (gastroesophageal reflux disease)   . Gout   . UTI (lower urinary tract infection) 08/2012    E.coli     Assessment: 80yom with hx CAD/CABG -multiple PCIs since CABG, developed CP with radiation to L arm now on heparin.  Heparin level at goal.  No bleeding noted. Plt=70, but last 8mo of CBC shows pltc 70-80.   Goal of Therapy:  Heparin level 0.3-0.7 units/ml Monitor platelets  by anticoagulation protocol: Yes   Plan:  Continue heparin at 1250 units/hr. Daily heparin level/cbc.  Jerrye Beavers, PharmD, BCPS Clinical Pharmacist  Pager: [phone]

## 2013-05-17 NOTE — ED Provider Notes (Signed)
I saw and evaluated the patient, reviewed the resident's note and I agree with the findings and plan.  Please see other note.   Virgel Manifold, MD 05/17/13 1217

## 2013-06-12 ENCOUNTER — Ambulatory Visit (INDEPENDENT_AMBULATORY_CARE_PROVIDER_SITE_OTHER): Payer: Medicare Other | Admitting: Physician Assistant

## 2013-06-12 ENCOUNTER — Encounter: Payer: Self-pay | Admitting: Physician Assistant

## 2013-06-12 VITALS — BP 136/68 | HR 56 | Ht 72.0 in | Wt 203.8 lb

## 2013-06-12 DIAGNOSIS — I10 Essential (primary) hypertension: Secondary | ICD-10-CM

## 2013-06-12 DIAGNOSIS — E785 Hyperlipidemia, unspecified: Secondary | ICD-10-CM

## 2013-06-12 DIAGNOSIS — I5042 Chronic combined systolic (congestive) and diastolic (congestive) heart failure: Secondary | ICD-10-CM

## 2013-06-12 DIAGNOSIS — I2510 Atherosclerotic heart disease of native coronary artery without angina pectoris: Secondary | ICD-10-CM

## 2013-06-12 MED ORDER — ATORVASTATIN CALCIUM 80 MG PO TABS: 40.0000 mg | ORAL_TABLET | Freq: Every day | ORAL | Status: DC

## 2013-06-12 NOTE — Progress Notes (Signed)
[facility]. [address], [address] [place], [place]  [address] Phone: [phone] Fax:  [phone]  Date:  06/12/2013   ID:  Scott [name], DOB [birth year], MRN [id number]  PCP:  Hilbert Corrigan, MD  Cardiologist:  Dr. Peter Martinique     History of Present Illness: Scott [name] is a 79 y.o. male who returns for follow up after a recent admission to the hospital 8/2-8/3 for chest pain.  He has a hx of CAD, s/p CABG and multiple PCIs since, DM2, HTN, HL, combined systolic and diastolic CHF as well as R sided CHF, gout, CKD. He was admitted in 08/2012 with severe R sided heart failure. His EF was previously normal but was now reduced. Echo 08/2012: Mild LVH, EF 25-30%, trivial AI, MAC, mild MR, moderate BAE, mild RVE, moderately reduced RVSF, moderate TR, PASP 77. Because of his reduced EF, he was set up for a right and left-sided heart catheterization. This demonstrated that the LAD and RCA was occluded, pCFX with 50%, SVG-PDA was patent with patent stents, SVG-PLA patent with mild proximal ISR, LIMA-LAD patent.   He presented to the hospital with prolonged CP after eating similar to previous angina with associated diaphoresis, left arm numbness and dyspnea. Cardiac markers remained normal. Inpatient Lexiscan Myoview 05/13/13: No ischemia, EF 44%.  Patient was noted to have worsening ECG changes with more prominent downsloping ST depressions in V4-V6, II, III, aVF. It was thought this may have represented changes from severe native CAD. Prior cardiac catheterization was reviewed by Dr. Acie Fredrickson and medical therapy was planned.  Long-acting nitrates were added to his medical regimen.  Patient has not started isosorbide. He has been quite active since his discharge from the hospital. He works out at [employer] 3 days week. He denies recurrent chest discomfort. He denies significant dyspnea. He denies arm or jaw discomfort. He denies syncope. He denies orthopnea, PND or edema. Weights have been stable.  Labs  (11/13):   TSH 1.942, LDL 23 Labs (8/14):     K 4.4, Cr 1.29, ALT 80, Hgb 14.8, PLT 70K  Wt Readings from Last 3 Encounters:  06/12/13 203 lb 12.8 oz (92.443 kg)  05/12/13 200 lb (90.719 kg)  05/02/13 201 lb (91.173 kg)     Past Medical History  Diagnosis Date  . Hypertension   . Hyperkalemia   . Thrombocytopenia   . Dyslipidemia   . Arthritis   . Gout   . CAD (coronary artery disease)     a. 3v CABG 1997 b. PCI of SVG to PL '05 c. PCI SVG to PL & SVG to PDA '11 d. Cath 08/2012 patent grafts e. Lexiscan Myoview 05/13/13: no evidence of ischemia or infarction, EF 44%  . Sleep apnea   . Cataract   . Chronic combined systolic and diastolic CHF (congestive heart failure)     EF 50-55% 11/2011; EF 25-30% 08/2012  . Diabetes mellitus     insulin dependent  . Chronic kidney disease     Stage3  . GERD (gastroesophageal reflux disease)   . Gout   . UTI (lower urinary tract infection) 08/2012    E.coli    Current Outpatient Prescriptions  Medication Sig Dispense Refill  . allopurinol (ZYLOPRIM) 100 MG tablet Take 2 tablets (200 mg total) by mouth daily.  60 tablet  1  . aspirin 81 MG EC tablet Take 81 mg by mouth daily.        . carvedilol (COREG) 12.5 MG tablet Take 1  tablet (12.5 mg total) by mouth 2 (two) times daily with a meal.  60 tablet  6  . clopidogrel (PLAVIX) 75 MG tablet Take 75 mg by mouth daily.      . colchicine 0.6 MG tablet Take 0.6 mg by mouth 3 (three) times daily as needed. For gout pain      . Cyanocobalamin (VITAMIN B-12 PO) Take 1 tablet by mouth daily.      . famotidine (PEPCID) 20 MG tablet Take 20 mg by mouth at bedtime.      . furosemide (LASIX) 40 MG tablet Take 40 mg by mouth 2 (two) times daily.       . insulin NPH (HUMULIN N,NOVOLIN N) 100 UNIT/ML injection Inject 15 Units into the skin 2 (two) times daily before a meal.      . insulin regular (NOVOLIN R,HUMULIN R) 100 units/mL injection Inject 10 Units into the skin 2 (two) times daily before a meal.       . lisinopril (PRINIVIL,ZESTRIL) 5 MG tablet Take 1 tablet (5 mg total) by mouth daily.  30 tablet  6  . Multiple Vitamin (MULTIVITAMIN) tablet Take 1 tablet by mouth daily.        . nitroGLYCERIN (NITROSTAT) 0.4 MG SL tablet Place 1 tablet (0.4 mg total) under the tongue every 5 (five) minutes as needed for chest pain (up to 3 doses).  25 tablet  3  . pantoprazole (PROTONIX) 40 MG tablet Take 1 tablet (40 mg total) by mouth daily.  30 tablet  2  . atorvastatin (LIPITOR) 80 MG tablet Take 1 tablet (80 mg total) by mouth daily.  30 tablet  3  . isosorbide mononitrate (IMDUR) 30 MG 24 hr tablet Take 1 tablet (30 mg total) by mouth daily.  30 tablet  3   No current facility-administered medications for this visit.    Allergies:   No Known Allergies  Social History:  The patient  reports that he quit smoking about 29 years ago. His smoking use included Cigarettes. He has a 52.5 pack-year smoking history. He has never used smokeless tobacco. He reports that  drinks alcohol. He reports that he does not use illicit drugs.   ROS:  Please see the history of present illness.      All other systems reviewed and negative.   PHYSICAL EXAM: VS:  BP 136/68  Pulse 56  Ht 6' (1.829 m)  Wt 203 lb 12.8 oz (92.443 kg)  BMI 27.63 kg/m2 Well nourished, well developed, in no acute distress HEENT: normal Neck: no JVD Cardiac:  normal S1, S2; RRR; no murmur Lungs:  clear to auscultation bilaterally, no wheezing, rhonchi or rales Abd: soft, nontender, no hepatomegaly Ext: no edema Skin: warm and dry Neuro:  CNs 2-12 intact, no focal abnormalities noted  EKG:  Sinus bradycardia, HR 56, rightward axis, inferolateral ST changes similar to prior tracings     ASSESSMENT AND PLAN:  1. CAD:  No angina.  Continue ASA, Plavix and statin. Without recurrent symptoms, he can refrain from starting isosorbide. 2. Chronic Combined Systolic and Diastolic CHF:  EF improved to 44% by recent nuclear study.  He is NYHA  Class II-IIb.  Volume is stable.  Continue beta blocker and ACE inhibitor.  He weighs daily and knows when to take extra lasix.  3. CKD:  Recent creatinine stable.  4. Hyperlipidemia:  He was switched to Atorvastatin in the hospital.  He notes muscle cramps with higher doses of Simvastatin in the past.  Given his age and prior intolerance to statins, I have recommended he remain on Atorvastatin but reduce his dose to 40 mg QD.  Check Lipids and LFTs in 6-8 weeks.   5. Hypertension:  Controlled.  Continue current therapy.  6. GERD:  Continue PPI. 7. Disposition:  F/u with Dr. Peter Martinique in 3 mos.   Signed, Richardson Dopp, PA-C  06/12/2013 10:42 AM

## 2013-06-12 NOTE — Patient Instructions (Addendum)
DECREASE ATORVASTATIN TO 40 MG EVERY NIGHT (1/2 TAB OF THE 80 MG)  STOP TAKING THE IMDUR  YOU WILL NEED FASTING LIPID AND LIVER PANEL TO BE DONE IN 6-8 WEEKS  PLEASE FOLLOW UP WITH DR. Martinique IN 3 MONTHS

## 2013-08-06 ENCOUNTER — Other Ambulatory Visit (INDEPENDENT_AMBULATORY_CARE_PROVIDER_SITE_OTHER): Payer: Medicare Other

## 2013-08-06 DIAGNOSIS — E785 Hyperlipidemia, unspecified: Secondary | ICD-10-CM

## 2013-08-06 DIAGNOSIS — I10 Essential (primary) hypertension: Secondary | ICD-10-CM

## 2013-08-06 LAB — HEPATIC FUNCTION PANEL
ALT: 29 U/L (ref 0–53)
AST: 27 U/L (ref 0–37)
Albumin: 4.1 g/dL (ref 3.5–5.2)
Alkaline Phosphatase: 61 U/L (ref 39–117)
Bilirubin, Direct: 0.1 mg/dL (ref 0.0–0.3)
Total Bilirubin: 0.9 mg/dL (ref 0.3–1.2)
Total Protein: 7.3 g/dL (ref 6.0–8.3)

## 2013-08-06 LAB — LDL CHOLESTEROL, DIRECT: Direct LDL: 29.8 mg/dL

## 2013-08-06 LAB — BASIC METABOLIC PANEL
BUN: 43 mg/dL — ABNORMAL HIGH (ref 6–23)
CO2: 31 mEq/L (ref 19–32)
Calcium: 9.1 mg/dL (ref 8.4–10.5)
Chloride: 98 mEq/L (ref 96–112)
Creatinine, Ser: 1.5 mg/dL (ref 0.4–1.5)
GFR: 48.04 mL/min — ABNORMAL LOW (ref >60.00)
Glucose, Bld: 252 mg/dL — ABNORMAL HIGH (ref 70–99)
Potassium: 4.1 mEq/L (ref 3.5–5.1)
Sodium: 140 mEq/L (ref 135–145)

## 2013-08-06 LAB — LIPID PANEL
Cholesterol: 118 mg/dL (ref 0–200)
HDL: 47.6 mg/dL (ref >39.00)
Total CHOL/HDL Ratio: 2
Triglycerides: 237 mg/dL — ABNORMAL HIGH (ref 0.0–149.0)
VLDL: 47.4 mg/dL — ABNORMAL HIGH (ref 0.0–40.0)

## 2013-08-09 ENCOUNTER — Other Ambulatory Visit: Payer: Self-pay

## 2013-08-09 MED ORDER — FUROSEMIDE 40 MG PO TABS: 40.0000 mg | ORAL_TABLET | Freq: Two times a day (BID) | ORAL | Status: DC

## 2013-09-12 ENCOUNTER — Ambulatory Visit (INDEPENDENT_AMBULATORY_CARE_PROVIDER_SITE_OTHER): Payer: Medicare Other | Admitting: Cardiology

## 2013-09-12 ENCOUNTER — Encounter: Payer: Self-pay | Admitting: Cardiology

## 2013-09-12 VITALS — BP 139/90 | HR 67 | Ht 72.0 in | Wt 213.0 lb

## 2013-09-12 DIAGNOSIS — E785 Hyperlipidemia, unspecified: Secondary | ICD-10-CM

## 2013-09-12 DIAGNOSIS — I10 Essential (primary) hypertension: Secondary | ICD-10-CM

## 2013-09-12 DIAGNOSIS — I2589 Other forms of chronic ischemic heart disease: Secondary | ICD-10-CM

## 2013-09-12 DIAGNOSIS — I255 Ischemic cardiomyopathy: Secondary | ICD-10-CM

## 2013-09-12 DIAGNOSIS — I2510 Atherosclerotic heart disease of native coronary artery without angina pectoris: Secondary | ICD-10-CM

## 2013-09-12 DIAGNOSIS — I5023 Acute on chronic systolic (congestive) heart failure: Secondary | ICD-10-CM

## 2013-09-12 NOTE — Patient Instructions (Signed)
Monitor your weight daily. If your weight is increasing or you notice more swelling increase your morning dose of lasix to 80 mg until the weight comes down.  I will see you in 3 months with lab work

## 2013-09-12 NOTE — Progress Notes (Signed)
And in  Scott [name] Date of Birth: 06-Jul-1933 Medical Record [id number]  History of Present Illness: Scott [name] is seen today for follow up. He has multiple issues which include chronic systolic and diastolic heart failure, HTN, HLD, insulin dependent DM, GERD, CAD with past CABG, stage 3 CKD, gout, UTI and dietary noncompliance.  He was most recently been admitted in December 2013 with severe right heart failure. He was diuresed 22 pounds. Echo showed EF 25 to 30% with mild RM, mod TR, mod RAE & LAE, moderate reduced RV systolic function and PASP of 16mmHg. He did undergo cardiac cath which showed his grafts to be patent, moderate pulmonary HTN, elevated LV and pulmonary wedge pressures. On followup today he reports he is doing okay. He still eats out for most of his meals. His sodium intake is unknown. His weight has been steadily creeping up. He still goes to the [employer] days a [facility] and either swims rides an exercise bike. He denies any increase shortness of breath. Sometimes his ankles feel warm or swollen. He is currently taking 40 of Lasix twice a day.   Current Outpatient Prescriptions on File Prior to Visit  Medication Sig Dispense Refill  . allopurinol (ZYLOPRIM) 100 MG tablet Take 2 tablets (200 mg total) by mouth daily.  60 tablet  1  . aspirin 81 MG EC tablet Take 81 mg by mouth daily.        . carvedilol (COREG) 12.5 MG tablet Take 1 tablet (12.5 mg total) by mouth 2 (two) times daily with a meal.  60 tablet  6  . clopidogrel (PLAVIX) 75 MG tablet Take 75 mg by mouth daily.      . colchicine 0.6 MG tablet Take 0.6 mg by mouth 3 (three) times daily as needed. For gout pain      . Cyanocobalamin (VITAMIN B-12 PO) Take 1 tablet by mouth daily.      . famotidine (PEPCID) 20 MG tablet Take 20 mg by mouth at bedtime.      . furosemide (LASIX) 40 MG tablet Take 1 tablet (40 mg total) by mouth 2 (two) times daily.  60 tablet  6  . insulin NPH (HUMULIN N,NOVOLIN N) 100 UNIT/ML injection Inject 15  Units into the skin 2 (two) times daily before a meal.      . insulin regular (NOVOLIN R,HUMULIN R) 100 units/mL injection Inject 10 Units into the skin 2 (two) times daily before a meal.      . lisinopril (PRINIVIL,ZESTRIL) 5 MG tablet Take 1 tablet (5 mg total) by mouth daily.  30 tablet  6  . Multiple Vitamin (MULTIVITAMIN) tablet Take 1 tablet by mouth daily.        . nitroGLYCERIN (NITROSTAT) 0.4 MG SL tablet Place 1 tablet (0.4 mg total) under the tongue every 5 (five) minutes as needed for chest pain (up to 3 doses).  25 tablet  3  . pantoprazole (PROTONIX) 40 MG tablet Take 1 tablet (40 mg total) by mouth daily.  30 tablet  2   No current facility-administered medications on file prior to visit.    No Known Allergies  Past Medical History  Diagnosis Date  . Hypertension   . Hyperkalemia   . Thrombocytopenia   . Dyslipidemia   . Arthritis   . Gout   . CAD (coronary artery disease)     a. 3v CABG 1997 b. PCI of SVG to PL '05 c. PCI SVG to PL & SVG to PDA '11  d. Cath 08/2012 patent grafts e. Lexiscan Myoview 05/13/13: no evidence of ischemia or infarction, EF 44%  . Sleep apnea   . Cataract   . Chronic combined systolic and diastolic CHF (congestive heart failure)     EF 50-55% 11/2011; EF 25-30% 08/2012  . Diabetes mellitus     insulin dependent  . Chronic kidney disease     Stage3  . GERD (gastroesophageal reflux disease)   . Gout   . UTI (lower urinary tract infection) 08/2012    E.coli    Past Surgical History  Procedure Laterality Date  . Coronary artery bypass graft  1997     (LIMA to LAD, SVG to PDA, SVG to PL)  . Cardiac catheterization  2005    stenting of the vein graft to the posterior lateral per -- Dr. Martinique      . Cardiac catheterization  2011    showed right coronary, totally occluded LAD and severe stenosis in both saphenous vein graft to the posterior lateral and posterior descending  -- stenting of the proximal portion of the SVG to the posterior  lateral branch in 2/11  . Appendectomy    . Cholecystectomy    . Back surgery      15 years ago  . Partial colectomy      cancerous polyps    History  Smoking status  . Former Smoker -- 1.50 packs/day for 35 years  . Types: Cigarettes  . Quit date: 10/12/1983  Smokeless tobacco  . Never Used    History  Alcohol Use  . Yes    Comment: social only    Family History  Problem Relation Age of Onset  . Coronary artery disease Father   . Heart attack Father   . Coronary artery disease Brother     1/2 brother   . Kidney disease Sister   . Cardiomyopathy Mother   . Asthma Brother     Review of Systems: As noted in history of present illness.  All other systems were reviewed and are negative.  Physical Exam: BP 139/90  Pulse 67  Ht 6' (1.829 m)  Wt 213 lb (96.616 kg)  BMI 28.88 kg/m2 Patient is very pleasant and in no acute distress. Skin is warm and dry. Color is normal.  HEENT is unremarkable. Normocephalic/atraumatic. PERRL. Sclera are nonicteric. Neck is supple. No masses. No JVD. Lungs are clear. Cardiac exam shows a regular rate and rhythm. Abdomen is soft. It is  distended. No hepatosplenomegaly. Bowel sounds are positive. Extremities are without edema. Gait and ROM are intact. No gross neurologic deficits noted.   LABORATORY DATA:   Lab Results  Component Value Date   WBC 10.3 05/13/2013   HGB 14.8 05/13/2013   HCT 43.6 05/13/2013   PLT 70* 05/13/2013   GLUCOSE 252* 08/06/2013   CHOL 118 08/06/2013   TRIG 237.0* 08/06/2013   HDL 47.60 08/06/2013   LDLDIRECT 29.8 08/06/2013   LDLCALC 23 09/01/2012   ALT 29 08/06/2013   AST 27 08/06/2013   NA 140 08/06/2013   K 4.1 08/06/2013   CL 98 08/06/2013   CREATININE 1.5 08/06/2013   BUN 43* 08/06/2013   CO2 31 08/06/2013   TSH 1.942 08/31/2012   INR 0.99 05/12/2013   HGBA1C 7.0* 08/31/2012   Echo Study Conclusions  - Left ventricle: The cavity size was mildly dilated. Wall thickness was increased in a pattern of  mild LVH. Systolic function was severely reduced. The estimated ejection fraction was in  the range of 25% to 30%. Diffuse hypokinesis. Doppler parameters are consistent with restrictive physiology, indicative of decreased left ventricular diastolic compliance and/or increased left atrial pressure. Doppler parameters are consistent with high ventricular filling pressure. - Aortic valve: Trivial regurgitation. - Mitral valve: Calcified annulus. Mild regurgitation. - Left atrium: The atrium was moderately dilated. - Right ventricle: The cavity size was mildly dilated. Systolic function was moderately reduced. - Right atrium: The atrium was moderately dilated. - Tricuspid valve: Moderate regurgitation. - Pulmonary arteries: Systolic pressure was severely increased. PA peak pressure: 42mm Hg (S).  Cardiac Cath Final Conclusions:   1. Severe three-vessel native coronary artery disease  2. Continued patency of the saphenous vein graft to PDA, saphenous vein graft to PLA, and LIMA to LAD  3. Moderate pulmonary hypertension likely related to left heart disease  4. Elevated left ventricular and pulmonary wedge pressure with large V waves   Recommendations: Continued medical therapy for treatment of congestive heart failure.  Sherren Mocha  09/04/2012, 3:25 PM   Assessment / Plan: 1. Chronic systolic and diastolic congestive heart care secondary to Ischemic CM - EF of 25 to 30% - he has major issues with compliance with sodium restriction. Currently NYHA class IIB. I recommended continuing his Lasix to 40 mg twice a day. Suggested that if he gains more than 3 pounds in a day or 5 pounds in a week he should increase his a.m. Lasix to 80 mg.  Given his history of hyperkalemia he is not a good candidate for Aldactone. I will followup again in 3 months and check a CBC, basic metabolic panel, and BNP level at that time.  2. CAD - prior cath shows bypass grafts to be patent - no chest pain reported.    3. CKD -  He is on low dose ACE.

## 2013-10-28 ENCOUNTER — Other Ambulatory Visit: Payer: Self-pay | Admitting: Cardiology

## 2013-12-13 ENCOUNTER — Other Ambulatory Visit: Payer: Self-pay | Admitting: Cardiology

## 2013-12-27 ENCOUNTER — Ambulatory Visit (INDEPENDENT_AMBULATORY_CARE_PROVIDER_SITE_OTHER): Payer: Commercial Managed Care - HMO | Admitting: Cardiology

## 2013-12-27 ENCOUNTER — Encounter: Payer: Self-pay | Admitting: Cardiology

## 2013-12-27 VITALS — BP 132/60 | HR 50 | Ht 72.0 in | Wt 213.0 lb

## 2013-12-27 DIAGNOSIS — I2510 Atherosclerotic heart disease of native coronary artery without angina pectoris: Secondary | ICD-10-CM

## 2013-12-27 DIAGNOSIS — E785 Hyperlipidemia, unspecified: Secondary | ICD-10-CM

## 2013-12-27 DIAGNOSIS — I5022 Chronic systolic (congestive) heart failure: Secondary | ICD-10-CM

## 2013-12-27 DIAGNOSIS — I219 Acute myocardial infarction, unspecified: Secondary | ICD-10-CM

## 2013-12-27 DIAGNOSIS — I10 Essential (primary) hypertension: Secondary | ICD-10-CM

## 2013-12-27 NOTE — Patient Instructions (Signed)
Continue your current therapy  Restrict your salt intake as much as possible.  We will schedule you for an Echocardiogram.  I will see you in 4 months.

## 2013-12-27 NOTE — Progress Notes (Signed)
[facility] in  [name] Date of Birth: [birth year] Medical Record [id number]  History of Present Illness: Scott [name] is seen today for follow up. He has multiple issues which include chronic systolic and diastolic heart failure, HTN, HLD, insulin dependent DM, GERD, CAD with past CABG, stage 3 CKD, gout, UTI and dietary noncompliance.  In December 2013-  Echo showed EF 25 to 30% with mild RM, mod TR, mod RAE & LAE, moderate reduced RV systolic function and PASP of 81mmHg. He did undergo cardiac cath which showed his grafts to be patent, moderate pulmonary HTN, elevated LV and pulmonary wedge pressures. On followup today he reports he is doing okay. He still eats out for most of his meals. Tries to watch his salt intake but it is difficult. His weight is stable. No increase in edema, SOB, or chest pain. He still goes to [facility] [employer] days a week and either swims rides an exercise bike. He does have regular gouty attacks in his big toe for which he takes colchicine.   Current Outpatient Prescriptions on File Prior to Visit  Medication Sig Dispense Refill  . allopurinol (ZYLOPRIM) 100 MG tablet Take 2 tablets (200 mg total) by mouth daily.  60 tablet  1  . aspirin 81 MG EC tablet Take 81 mg by mouth daily.        . carvedilol (COREG) 12.5 MG tablet Take 1 tablet (12.5 mg total) by mouth 2 (two) times daily with a meal.  60 tablet  6  . clopidogrel (PLAVIX) 75 MG tablet TAKE 1 TABLET (75 MG TOTAL) BY MOUTH DAILY.  30 tablet  1  . colchicine 0.6 MG tablet Take 0.6 mg by mouth 3 (three) times daily as needed. For gout pain      . Cyanocobalamin (VITAMIN B-12 PO) Take 1 tablet by mouth daily.      . famotidine (PEPCID) 20 MG tablet Take 20 mg by mouth at bedtime.      . furosemide (LASIX) 40 MG tablet Take 1 tablet (40 mg total) by mouth 2 (two) times daily.  60 tablet  6  . insulin NPH (HUMULIN N,NOVOLIN N) 100 UNIT/ML injection Inject 15 Units into the skin 2 (two) times daily before a meal.      . insulin regular  (NOVOLIN R,HUMULIN R) 100 units/mL injection Inject 10 Units into the skin 2 (two) times daily before a meal.      . lisinopril (PRINIVIL,ZESTRIL) 5 MG tablet Take 1 tablet (5 mg total) by mouth daily.  30 tablet  6  . Multiple Vitamin (MULTIVITAMIN) tablet Take 1 tablet by mouth daily.        . nitroGLYCERIN (NITROSTAT) 0.4 MG SL tablet Place 1 tablet (0.4 mg total) under the tongue every 5 (five) minutes as needed for chest pain (up to 3 doses).  25 tablet  3  . pantoprazole (PROTONIX) 40 MG tablet Take 1 tablet (40 mg total) by mouth daily.  30 tablet  2   No current facility-administered medications on file prior to visit.    No Known Allergies  Past Medical History  Diagnosis Date  . Hypertension   . Hyperkalemia   . Thrombocytopenia   . Dyslipidemia   . Arthritis   . Gout   . CAD (coronary artery disease)     a. 3v CABG 1997 b. PCI of SVG to PL '05 c. PCI SVG to PL & SVG to PDA '11 d. Cath 08/2012 patent grafts e. Lexiscan Myoview 05/13/13: no  evidence of ischemia or infarction, EF 44%  . Sleep apnea   . Cataract   . Chronic combined systolic and diastolic CHF (congestive heart failure)     EF 50-55% 11/2011; EF 25-30% 08/2012  . Diabetes mellitus     insulin dependent  . Chronic kidney disease     Stage3  . GERD (gastroesophageal reflux disease)   . Gout   . UTI (lower urinary tract infection) 08/2012    E.coli    Past Surgical History  Procedure Laterality Date  . Coronary artery bypass graft  1997     (LIMA to LAD, SVG to PDA, SVG to PL)  . Cardiac catheterization  2005    stenting of the vein graft to the posterior lateral per -- Dr. Martinique      . Cardiac catheterization  2011    showed right coronary, totally occluded LAD and severe stenosis in both saphenous vein graft to the posterior lateral and posterior descending  -- stenting of the proximal portion of the SVG to the posterior lateral branch in 2/11  . Appendectomy    . Cholecystectomy    . Back surgery       15 years ago  . Partial colectomy      cancerous polyps    History  Smoking status  . Former Smoker -- 1.50 packs/day for 35 years  . Types: Cigarettes  . Quit date: 10/12/1983  Smokeless tobacco  . Never Used    History  Alcohol Use  . Yes    Comment: social only    Family History  Problem Relation Age of Onset  . Coronary artery disease Father   . Heart attack Father   . Coronary artery disease Brother     1/2 brother   . Kidney disease Sister   . Cardiomyopathy Mother   . Asthma Brother     Review of Systems: As noted in history of present illness.  All other systems were reviewed and are negative.  Physical Exam: BP 132/60  Pulse 50  Ht 6' (1.829 m)  Wt 213 lb (96.616 kg)  BMI 28.88 kg/m2 Patient is very pleasant and in no acute distress. Skin is warm and dry. Color is normal.  HEENT is unremarkable. Normocephalic/atraumatic. PERRL. Sclera are nonicteric. Neck is supple. No masses. No JVD. Lungs are clear. Cardiac exam shows a regular rate and rhythm. Abdomen is soft. It is not distended. No hepatosplenomegaly. Bowel sounds are positive. Extremities are without edema. Gait and ROM are intact. No gross neurologic deficits noted.   LABORATORY DATA:   Lab Results  Component Value Date   WBC 10.3 05/13/2013   HGB 14.8 05/13/2013   HCT 43.6 05/13/2013   PLT 70* 05/13/2013   GLUCOSE 252* 08/06/2013   CHOL 118 08/06/2013   TRIG 237.0* 08/06/2013   HDL 47.60 08/06/2013   LDLDIRECT 29.8 08/06/2013   LDLCALC 23 09/01/2012   ALT 29 08/06/2013   AST 27 08/06/2013   NA 140 08/06/2013   K 4.1 08/06/2013   CL 98 08/06/2013   CREATININE 1.5 08/06/2013   BUN 43* 08/06/2013   CO2 31 08/06/2013   TSH 1.942 08/31/2012   INR 0.99 05/12/2013   HGBA1C 7.0* 08/31/2012   Echo Study Conclusions  - Left ventricle: The cavity size was mildly dilated. Wall thickness was increased in a pattern of mild LVH. Systolic function was severely reduced. The estimated ejection fraction  was in the range of 25% to 30%. Diffuse hypokinesis. Doppler parameters  are consistent with restrictive physiology, indicative of decreased left ventricular diastolic compliance and/or increased left atrial pressure. Doppler parameters are consistent with high ventricular filling pressure. - Aortic valve: Trivial regurgitation. - Mitral valve: Calcified annulus. Mild regurgitation. - Left atrium: The atrium was moderately dilated. - Right ventricle: The cavity size was mildly dilated. Systolic function was moderately reduced. - Right atrium: The atrium was moderately dilated. - Tricuspid valve: Moderate regurgitation. - Pulmonary arteries: Systolic pressure was severely increased. PA peak pressure: 14mm Hg (S).  Cardiac Cath Final Conclusions:   1. Severe three-vessel native coronary artery disease  2. Continued patency of the saphenous vein graft to PDA, saphenous vein graft to PLA, and LIMA to LAD  3. Moderate pulmonary hypertension likely related to left heart disease  4. Elevated left ventricular and pulmonary wedge pressure with large V waves   Recommendations: Continued medical therapy for treatment of congestive heart failure.  Sherren Mocha  09/04/2012, 3:25 PM   Assessment / Plan: 1. Chronic systolic and diastolic congestive heart care secondary to Ischemic CM - EF of 25 to 30% - he has major issues with compliance with sodium restriction. I recommended continuing his Lasix to 40 mg twice a day. Suggested that if he gains more than 3 pounds in a day or 5 pounds in a week he should increase his a.m. Lasix to 80 mg.  Given his history of hyperkalemia he is not a good candidate for Aldactone. We will update his Echocardiogram. Follow up in 4 months with BMET and BNP  2. CAD - prior cath shows bypass grafts to be patent - no chest pain reported.   3. CKD -  He is on low dose ACE.

## 2014-01-10 ENCOUNTER — Ambulatory Visit (HOSPITAL_COMMUNITY): Payer: Medicare HMO | Attending: Cardiovascular Disease | Admitting: Radiology

## 2014-01-10 ENCOUNTER — Encounter: Payer: Self-pay | Admitting: Cardiovascular Disease

## 2014-01-10 DIAGNOSIS — Z87891 Personal history of nicotine dependence: Secondary | ICD-10-CM | POA: Insufficient documentation

## 2014-01-10 DIAGNOSIS — I252 Old myocardial infarction: Secondary | ICD-10-CM | POA: Insufficient documentation

## 2014-01-10 DIAGNOSIS — I129 Hypertensive chronic kidney disease with stage 1 through stage 4 chronic kidney disease, or unspecified chronic kidney disease: Secondary | ICD-10-CM | POA: Insufficient documentation

## 2014-01-10 DIAGNOSIS — I2510 Atherosclerotic heart disease of native coronary artery without angina pectoris: Secondary | ICD-10-CM

## 2014-01-10 DIAGNOSIS — E119 Type 2 diabetes mellitus without complications: Secondary | ICD-10-CM | POA: Insufficient documentation

## 2014-01-10 DIAGNOSIS — I5022 Chronic systolic (congestive) heart failure: Secondary | ICD-10-CM

## 2014-01-10 DIAGNOSIS — N189 Chronic kidney disease, unspecified: Secondary | ICD-10-CM | POA: Insufficient documentation

## 2014-01-10 DIAGNOSIS — I059 Rheumatic mitral valve disease, unspecified: Secondary | ICD-10-CM | POA: Insufficient documentation

## 2014-01-10 DIAGNOSIS — E785 Hyperlipidemia, unspecified: Secondary | ICD-10-CM

## 2014-01-10 DIAGNOSIS — E669 Obesity, unspecified: Secondary | ICD-10-CM | POA: Insufficient documentation

## 2014-01-10 DIAGNOSIS — I509 Heart failure, unspecified: Secondary | ICD-10-CM | POA: Insufficient documentation

## 2014-01-10 DIAGNOSIS — I379 Nonrheumatic pulmonary valve disorder, unspecified: Secondary | ICD-10-CM | POA: Insufficient documentation

## 2014-01-10 DIAGNOSIS — I219 Acute myocardial infarction, unspecified: Secondary | ICD-10-CM

## 2014-01-10 DIAGNOSIS — I079 Rheumatic tricuspid valve disease, unspecified: Secondary | ICD-10-CM | POA: Insufficient documentation

## 2014-01-10 DIAGNOSIS — I10 Essential (primary) hypertension: Secondary | ICD-10-CM

## 2014-01-10 NOTE — Progress Notes (Signed)
Echocardiogram Performed. 

## 2014-02-08 ENCOUNTER — Other Ambulatory Visit: Payer: Self-pay | Admitting: Cardiology

## 2014-02-20 ENCOUNTER — Telehealth: Payer: Self-pay | Admitting: Cardiology

## 2014-02-20 NOTE — Telephone Encounter (Signed)
Called stating he is having cataract surgery Fri at the [facility] and they need note from Dr. Martinique stating it is OK for him to have the surgery.  Will need to pick up note tomorrow afternoon.  Advised pt that Elly Modena, his nurse, will call you when note is available for pick up.  Will forward message to her.  Note just needs to say that he is OK to have cataract surgery.

## 2014-02-20 NOTE — Telephone Encounter (Signed)
New problem     Pt having eye surgery and need to know if it's ok to have surgery.

## 2014-02-20 NOTE — Telephone Encounter (Signed)
He is OK to have cataract surgery from my standpoint.  Tedi Hughson Martinique MD, [facility]

## 2014-02-21 ENCOUNTER — Ambulatory Visit (INDEPENDENT_AMBULATORY_CARE_PROVIDER_SITE_OTHER): Payer: Commercial Managed Care - HMO | Admitting: Pulmonary Disease

## 2014-02-21 ENCOUNTER — Encounter: Payer: Self-pay | Admitting: Pulmonary Disease

## 2014-02-21 VITALS — BP 130/82 | HR 49 | Temp 98.2°F | Ht 72.0 in | Wt 215.2 lb

## 2014-02-21 DIAGNOSIS — G4733 Obstructive sleep apnea (adult) (pediatric): Secondary | ICD-10-CM

## 2014-02-21 NOTE — Progress Notes (Signed)
   Subjective:    Patient ID: Scott [name], male    DOB: [birth year], 80 y.o.   MRN: [id number]  HPI The patient comes in today for followup of his obstructive sleep apnea. He is wearing CPAP compliantly, and is having no issues with his mask fit or pressure. He does complain about the straps on his mask hurting his ears, and would like to look at alternatives. He feels that he sleeps well with the device, and continues to have adequate daytime alertness. Of note, his weight is up about 8 pounds since last visit.   Review of Systems  Constitutional: Negative for fever and unexpected weight change.  HENT: Positive for rhinorrhea. Negative for congestion, dental problem, ear pain, nosebleeds, postnasal drip, sinus pressure, sneezing, sore throat and trouble swallowing.   Eyes: Negative for redness and itching.  Respiratory: Negative for cough, chest tightness, shortness of breath and wheezing.   Cardiovascular: Negative for palpitations and leg swelling.  Gastrointestinal: Negative for nausea and vomiting.  Genitourinary: Negative for dysuria.  Musculoskeletal: Negative for joint swelling.  Skin: Negative for rash.  Neurological: Negative for headaches.  Hematological: Does not bruise/bleed easily.  Psychiatric/Behavioral: Negative for dysphoric mood. The patient is not nervous/anxious.        Objective:   Physical Exam Ow male in nad Nose without purulence or d/c noted. No skin breakdown or pressure necrosis from the cpap mask Neck without LN or TMG LE without edema, no cyanosis Alert and oriented, moves all 4.        Assessment & Plan:

## 2014-02-21 NOTE — Assessment & Plan Note (Signed)
The pt is doing fairly well with cpap, and feels that it continues to help his sleep and daytime alertness.  He is having an issue with his headgear, and we can work on trying a replacement.  I have also asked him to keep up with supplies and to work on weight loss.

## 2014-02-21 NOTE — Patient Instructions (Signed)
Continue with cpap, and keep up with mask changes and supplies. See if your homecare company can suggest a different head gear for your mask that doesn't rub your ears. Work on weight loss followup with me again in one year.

## 2014-02-22 ENCOUNTER — Ambulatory Visit: Payer: Medicare Other | Admitting: Pulmonary Disease

## 2014-02-22 NOTE — Telephone Encounter (Signed)
Note was wrote saying ok to have cataract surgery.Note left at front desk 3rd floor.

## 2014-02-28 ENCOUNTER — Ambulatory Visit: Payer: Commercial Managed Care - HMO | Admitting: Pulmonary Disease

## 2014-09-04 ENCOUNTER — Telehealth: Payer: Self-pay

## 2014-09-04 NOTE — Telephone Encounter (Signed)
Received request from [facility] requesting Dr.Jordan's last office note showing patient requires O2 for insurance to keep paying.12/27/13 last office note faxed to Particia Lather with [facility] at fax # [phone].

## 2014-09-11 ENCOUNTER — Other Ambulatory Visit: Payer: Self-pay

## 2014-09-11 MED ORDER — CLOPIDOGREL BISULFATE 75 MG PO TABS: 75.0000 mg | ORAL_TABLET | Freq: Every day | ORAL | Status: DC

## 2014-09-19 ENCOUNTER — Encounter (HOSPITAL_COMMUNITY): Payer: Self-pay | Admitting: Cardiovascular Disease

## 2014-10-16 ENCOUNTER — Other Ambulatory Visit: Payer: Self-pay

## 2014-10-16 MED ORDER — CLOPIDOGREL BISULFATE 75 MG PO TABS: 75.0000 mg | ORAL_TABLET | Freq: Every day | ORAL | Status: DC

## 2014-11-19 DIAGNOSIS — I2510 Atherosclerotic heart disease of native coronary artery without angina pectoris: Secondary | ICD-10-CM | POA: Diagnosis not present

## 2014-11-19 DIAGNOSIS — I259 Chronic ischemic heart disease, unspecified: Secondary | ICD-10-CM | POA: Diagnosis not present

## 2014-11-19 DIAGNOSIS — I509 Heart failure, unspecified: Secondary | ICD-10-CM | POA: Diagnosis not present

## 2014-11-19 DIAGNOSIS — R0602 Shortness of breath: Secondary | ICD-10-CM | POA: Diagnosis not present

## 2014-12-11 ENCOUNTER — Other Ambulatory Visit: Payer: Self-pay | Admitting: Cardiology

## 2014-12-18 DIAGNOSIS — I2510 Atherosclerotic heart disease of native coronary artery without angina pectoris: Secondary | ICD-10-CM | POA: Diagnosis not present

## 2014-12-18 DIAGNOSIS — I509 Heart failure, unspecified: Secondary | ICD-10-CM | POA: Diagnosis not present

## 2014-12-18 DIAGNOSIS — I259 Chronic ischemic heart disease, unspecified: Secondary | ICD-10-CM | POA: Diagnosis not present

## 2014-12-18 DIAGNOSIS — R0602 Shortness of breath: Secondary | ICD-10-CM | POA: Diagnosis not present

## 2014-12-30 ENCOUNTER — Encounter: Payer: Self-pay | Admitting: Cardiology

## 2014-12-30 ENCOUNTER — Ambulatory Visit (INDEPENDENT_AMBULATORY_CARE_PROVIDER_SITE_OTHER): Payer: Commercial Managed Care - HMO | Admitting: Cardiology

## 2014-12-30 ENCOUNTER — Telehealth: Payer: Self-pay

## 2014-12-30 VITALS — BP 160/74 | HR 57 | Ht 72.0 in | Wt 215.0 lb

## 2014-12-30 DIAGNOSIS — R112 Nausea with vomiting, unspecified: Secondary | ICD-10-CM | POA: Diagnosis not present

## 2014-12-30 DIAGNOSIS — R110 Nausea: Secondary | ICD-10-CM

## 2014-12-30 DIAGNOSIS — I5042 Chronic combined systolic (congestive) and diastolic (congestive) heart failure: Secondary | ICD-10-CM

## 2014-12-30 DIAGNOSIS — R509 Fever, unspecified: Secondary | ICD-10-CM

## 2014-12-30 DIAGNOSIS — R0609 Other forms of dyspnea: Secondary | ICD-10-CM | POA: Diagnosis not present

## 2014-12-30 DIAGNOSIS — I2510 Atherosclerotic heart disease of native coronary artery without angina pectoris: Secondary | ICD-10-CM

## 2014-12-30 DIAGNOSIS — R0602 Shortness of breath: Secondary | ICD-10-CM | POA: Diagnosis not present

## 2014-12-30 LAB — CBC WITH DIFFERENTIAL/PLATELET
Basophils Absolute: 0 10*3/uL (ref 0.0–0.1)
Basophils Relative: 0 % (ref 0–1)
Eosinophils Absolute: 0.3 10*3/uL (ref 0.0–0.7)
Eosinophils Relative: 3 % (ref 0–5)
HCT: 45.7 % (ref 39.0–52.0)
Hemoglobin: 14.5 g/dL (ref 13.0–17.0)
Lymphocytes Relative: 27 % (ref 12–46)
Lymphs Abs: 2.6 10*3/uL (ref 0.7–4.0)
MCH: 29.4 pg (ref 26.0–34.0)
MCHC: 31.7 g/dL (ref 30.0–36.0)
MCV: 92.5 fL (ref 78.0–100.0)
MPV: 11.4 fL (ref 8.6–12.4)
Monocytes Absolute: 0.8 10*3/uL (ref 0.1–1.0)
Monocytes Relative: 8 % (ref 3–12)
Neutro Abs: 5.9 10*3/uL (ref 1.7–7.7)
Neutrophils Relative %: 62 % (ref 43–77)
Platelets: 104 10*3/uL — ABNORMAL LOW (ref 150–400)
RBC: 4.94 MIL/uL (ref 4.22–5.81)
RDW: 14.4 % (ref 11.5–15.5)
WBC: 9.5 10*3/uL (ref 4.0–10.5)

## 2014-12-30 LAB — BASIC METABOLIC PANEL
BUN: 23 mg/dL (ref 6–23)
CO2: 27 mEq/L (ref 19–32)
Calcium: 9.3 mg/dL (ref 8.4–10.5)
Chloride: 100 mEq/L (ref 96–112)
Creat: 1.29 mg/dL (ref 0.50–1.35)
Glucose, Bld: 204 mg/dL — ABNORMAL HIGH (ref 70–99)
Potassium: 4.7 mEq/L (ref 3.5–5.3)
Sodium: 138 mEq/L (ref 135–145)

## 2014-12-30 MED ORDER — METOCLOPRAMIDE HCL 5 MG PO TABS: 5.0000 mg | ORAL_TABLET | Freq: Three times a day (TID) | ORAL | Status: DC

## 2014-12-30 NOTE — Patient Instructions (Signed)
PLEASE DO LABS  FOLLOW UP WITH PRIMARY ABOUT YOUR SYMPTOMS  TAKE REGLAN 5 MG ONE TABLET WITH MEALS AN AT BEDTIME.

## 2014-12-30 NOTE — Telephone Encounter (Signed)
Received a call from patient stating he would like to see Dr.Jordan today.Stated he has been nauseated for 1 week,tingling in left arm,sob.Stated symptoms worse today.Advised Dr.Jordan not in office today.Appointment scheduled with [facility] [place] today at 11:15 am at [facility] office.

## 2014-12-31 LAB — PRO B NATRIURETIC PEPTIDE: Pro B Natriuretic peptide (BNP): 1521 pg/mL — ABNORMAL HIGH (ref <451)

## 2015-01-01 ENCOUNTER — Telehealth: Payer: Self-pay | Admitting: *Deleted

## 2015-01-01 ENCOUNTER — Encounter: Payer: Self-pay | Admitting: Cardiology

## 2015-01-01 DIAGNOSIS — I5042 Chronic combined systolic (congestive) and diastolic (congestive) heart failure: Secondary | ICD-10-CM

## 2015-01-01 DIAGNOSIS — R509 Fever, unspecified: Secondary | ICD-10-CM | POA: Insufficient documentation

## 2015-01-01 DIAGNOSIS — R7989 Other specified abnormal findings of blood chemistry: Secondary | ICD-10-CM

## 2015-01-01 DIAGNOSIS — R0609 Other forms of dyspnea: Secondary | ICD-10-CM

## 2015-01-01 DIAGNOSIS — R110 Nausea: Secondary | ICD-10-CM | POA: Insufficient documentation

## 2015-01-01 DIAGNOSIS — Z79899 Other long term (current) drug therapy: Secondary | ICD-10-CM

## 2015-01-01 NOTE — Telephone Encounter (Signed)
-----   Message from Leonie Man, MD sent at 01/01/2015  1:23 AM EDT ----- BNP is elevated - suggests increased LV filling pressures --  See note to temporarily increase Lasix dose.  Renal Fxn looks stable.

## 2015-01-01 NOTE — Assessment & Plan Note (Addendum)
Subjective finding. Check BMP and CBC with differential. No dysuria to suggest UTI. No signs or symptoms of pulmonary infection. With nausea would expect potential GI bug.  Recommend f/u with PCP

## 2015-01-01 NOTE — Assessment & Plan Note (Signed)
He doesn't really describe early satiety, but has felt full and bloated. He does have diabetes, he could have some problems with gastroparesis. We'll prescribe a short course of Reglan to see if this helps his nausea. Also second CBC with differential to exclude any potential infectious etiology.

## 2015-01-01 NOTE — Telephone Encounter (Signed)
Spoke to patient RN informed patient of Dr Harding--instructions concerning lasix Patient voiced uderstanding. Will go to lab next week

## 2015-01-01 NOTE — Progress Notes (Signed)
PCP: Hilbert Corrigan, MD  Clinic Note: Chief Complaint  Patient presents with  . Nausea    has been occuring x 1 week, denies chest pain  . Tingling    in left arm  . Shortness of Breath   HPI: Scott [name] is a 81 y.o. male with a PMH below who presents today for urgent work in visit for nausea, shortness of breath and arm tingling. He is a patient of Dr. Carlis Stable with chronic combined systolic and diastolic heart failure, hypertension, hyperlipidemia, insulin-dependent type 2 diabetes and stage IIIc daily in addition to the CAD status post CABG. He is known for having poor dietary habits.  In December 2013- Echo showed EF 25 to 30% with mild RM, mod TR, mod RAE & LAE, moderate reduced RV systolic function and PASP of 92mmHg. He did undergo cardiac cath which showed his grafts to be patent, moderate pulmonary HTN, elevated LV and pulmonary wedge pressures.  Past Medical History  Diagnosis Date  . Hypertension   . Hyperkalemia   . Thrombocytopenia   . Dyslipidemia   . Arthritis   . Gout   . CAD (coronary artery disease)     a. 3v CABG 1997 b. PCI of SVG to PL '05 c. PCI SVG to PL & SVG to PDA '11 d. Cath 08/2012 patent grafts e. Lexiscan Myoview 05/13/13: no evidence of ischemia or infarction, EF 44%  . Sleep apnea   . Cataract   . Chronic combined systolic and diastolic CHF (congestive heart failure) 08/2012    EF 50-55% 11/2011; 08/2012 - EF 25-30%,  with mild RM, mod TR, mod RAE & LAE, moderate reduced RV systolic function and PASP of 64mmHg. - > cath showed patent grafts & Mod Puml HTN wth elevated PCWP  . Diabetes mellitus     insulin dependent  . Chronic kidney disease     Stage3  . GERD (gastroesophageal reflux disease)   . Gout   . UTI (lower urinary tract infection) 08/2012    E.coli    Prior Cardiac Evaluation and Past Surgical History: Past Surgical History  Procedure Laterality Date  . Coronary artery bypass graft  1997     (LIMA to LAD, SVG to PDA, SVG to PL)   . Cardiac catheterization  2005    stenting of the vein graft to the posterior lateral per -- Dr. Martinique      . Cardiac catheterization  2011    showed right coronary, totally occluded LAD and severe stenosis in both saphenous vein graft to the posterior lateral and posterior descending  -- stenting of the proximal portion of the SVG to the posterior lateral branch in 2/11  . Appendectomy    . Cholecystectomy    . Back surgery      15 years ago  . Partial colectomy      cancerous polyps  . Left and right heart catheterization with coronary/graft angiogram  09/04/2012    Procedure: LEFT AND RIGHT HEART CATHETERIZATION WITH Beatrix Fetters;  Surgeon: Sherren Mocha, MD;  Location: [facility] CATH LAB;  patent grafts, mod 2ndary pulmonary HTN, elevated LV EDP & PCWP   Interval History: Scott [name] presents today with a not set of symptoms. He says he feels full basically from the top of the sternum up to his throat. He feels nauseated as though he feels that he may want to throw up but is unable to. He says he is "a little short of breath and notes it more  when moving "too much."  He denies any PND or orthopnea however. He has felt a bit warm for the last couple mornings. The nausea this morning and they did not want to take his medications, so his blood pressure is elevated. He really has baseline mild edema but nothing significant. He hasn't been taking an extra dose or 2 of Lasix, but has not done it in a while. His weight is excellent stable the last year in May. Over last couple days he has not felt like going to do his routine exercise. He denies any increased salt loading.  He notes having occasional left arm tingling, this is also not exertional. He denies any of these symptoms are similar to his previous anginal equivalent. He doesn't facility note early satiety but does feel bloated.  No palpitations, lightheadedness, dizziness, weakness or syncope/near syncope. No TIA/amaurosis fugax  symptoms.  ROS: A comprehensive was performed. Review of Systems  Constitutional: Positive for fever (subjectively felt warm off & on over pst few days.) and malaise/fatigue (just doesn not "feel well"). Negative for chills.  Respiratory: Positive for shortness of breath. Negative for cough and wheezing.   Gastrointestinal: Positive for nausea. Negative for vomiting, abdominal pain (more of  a "fullness"), diarrhea, constipation, blood in stool and melena.  Genitourinary: Negative for hematuria.  Musculoskeletal: Positive for joint pain.  Neurological: Positive for tingling (left arm). Negative for loss of consciousness and headaches.  Endo/Heme/Allergies: Negative.  Does not bruise/bleed easily.  Psychiatric/Behavioral: Negative.   All other systems reviewed and are negative.   Current Outpatient Prescriptions on File Prior to Visit  Medication Sig Dispense Refill  . allopurinol (ZYLOPRIM) 100 MG tablet Take 2 tablets (200 mg total) by mouth daily. 60 tablet 1  . aspirin 81 MG EC tablet Take 81 mg by mouth daily.      . carvedilol (COREG) 12.5 MG tablet Take 1 tablet (12.5 mg total) by mouth 2 (two) times daily with a meal. 60 tablet 6  . clopidogrel (PLAVIX) 75 MG tablet Take 1 tablet (75 mg total) by mouth daily. 30 tablet 0  . colchicine 0.6 MG tablet Take 0.6 mg by mouth 3 (three) times daily as needed. For gout pain    . Cyanocobalamin (VITAMIN B-12 PO) Take 1 tablet by mouth daily.    . famotidine (PEPCID) 20 MG tablet Take 20 mg by mouth at bedtime.    . furosemide (LASIX) 40 MG tablet Take 1 tablet (40 mg total) by mouth 2 (two) times daily. (Patient taking differently: Take 40 mg by mouth daily. ) 60 tablet 6  . insulin NPH (HUMULIN N,NOVOLIN N) 100 UNIT/ML injection Inject 15 Units into the skin 2 (two) times daily before a meal.    . insulin regular (NOVOLIN R,HUMULIN R) 100 units/mL injection Inject 10 Units into the skin 2 (two) times daily before a meal.    . lisinopril  (PRINIVIL,ZESTRIL) 5 MG tablet Take 1 tablet (5 mg total) by mouth daily. 30 tablet 6  . Multiple Vitamin (MULTIVITAMIN) tablet Take 1 tablet by mouth daily.      . nitroGLYCERIN (NITROSTAT) 0.4 MG SL tablet Place 1 tablet (0.4 mg total) under the tongue every 5 (five) minutes as needed for chest pain (up to 3 doses). 25 tablet 3   No current facility-administered medications on file prior to visit.   No Known Allergies  History  Substance Use Topics  . Smoking status: Former Smoker -- 1.50 packs/day for 35 years  Types: Cigarettes    Quit date: 10/12/1983  . Smokeless tobacco: Never Used  . Alcohol Use: Yes     Comment: social only   Family History  Problem Relation Age of Onset  . Coronary artery disease Father   . Heart attack Father   . Coronary artery disease Brother     1/2 brother   . Kidney disease Sister   . Cardiomyopathy Mother   . Asthma Brother     Wt Readings from Last 3 Encounters:  12/30/14 215 lb (97.523 kg)  02/21/14 215 lb 3.2 oz (97.614 kg)  12/27/13 213 lb (96.616 kg)   PHYSICAL EXAM BP 160/74 mmHg  Pulse 57  Ht 6' (1.829 m)  Wt 215 lb (97.523 kg)  BMI 29.15 kg/m2 General appearance: alert, cooperative, appears stated age, maybe mild distress/ just looks a bit uncomfortable - non-toxic;  and borderline obese with a protuberant abdomen HEENT: [place]/AT, EOMI, MMM, anicteric sclera Neck: no adenopathy, no carotid bruit and minimal JVD Lungs: clear to auscultation bilaterally, normal percussion bilaterally and non-labored Heart: regular rate and rhythm, S1 notmal & split  S2, soft ~1/6 HSM @ LLSB; No other M/R/G; non-displaced PMI Abdomen: soft, non-tender; bowel sounds normal; no masses,  no organomegaly; no HJR Extremities: extremities normal, atraumatic, no cyanosis, and edema ~1+ bilateral Pulses: 2+ and symmetric;  Skin: mobility and turgor normal  Neurologic: Mental status: Alert, oriented, thought content appropriate Cranial nerves: normal  (II-XII grossly intact)   Adult ECG Report  Rate: 57 ;  Rhythm: normal sinus rhythm, premature ventricular contractions (PVC) and 1 AVB, Non-specific IVCD, Non-specific ST-T wave abnormalities.  Narrative Interpretation: essentially stable ECG - QRS is slightly widened when compared to 05/2013.  Recent Labs:  None since 07/2013   ASSESSMENT / PLAN: Problem List Items Addressed This Visit    Arteriosclerotic coronary artery disease (Chronic)    I'm not sure and left arm tingling, does not sound like angina with what to me. Would defer any further invasive evaluation at this point. Unfortunately he did not his blood pressure medications today, but he is on a good dose of carvedilol and lisinopril. He has had multiple PCI's. He is on aspirin plus Plavix for long-standing dual antibiotic therapy.      Chronic combined systolic and diastolic heart failure    Not really sure what to make of his current symptoms. He has not been on any weight, and doesn't have any PND orthopnea. He does have some exertional dyspnea, but I'm not sure what his baseline is. He is a relatively poor historian.  No edema, minimal JVD no HJR or pulsatile liver.  At this point, I will check a BNP to assess for any signs of increased LV filling pressures. If elevated would probably recommend short course of increased Lasix. Would also potentially consider an echocardiogram, but would defer to Dr. Martinique who knows the patient.       Relevant Orders   EKG 12-Lead   Pro b natriuretic peptide (BNP) (Completed)   Basic metabolic panel (Completed)   CBC w/Diff (Completed)   DOE (dyspnea on exertion)    Unfortunately, I don't know his baseline. He does have chronic combined systolic and diastolic heart failure.  Check BNP withCMP (in case Lasix titration is indicated) Consider Echo.      Relevant Orders   EKG 12-Lead   Pro b natriuretic peptide (BNP) (Completed)   Basic metabolic panel (Completed)   CBC w/Diff  (Completed)   Low grade  fever    Subjective finding. Check BMP and CBC with differential. No dysuria to suggest UTI. No signs or symptoms of pulmonary infection. With nausea would expect potential GI bug.  Recommend f/u with PCP      Relevant Orders   EKG 12-Lead   Pro b natriuretic peptide (BNP) (Completed)   Basic metabolic panel (Completed)   CBC w/Diff (Completed)   Nausea - without vomiting - Primary    He doesn't really describe early satiety, but has felt full and bloated. He does have diabetes, he could have some problems with gastroparesis. We'll prescribe a short course of Reglan to see if this helps his nausea. Also second CBC with differential to exclude any potential infectious etiology.      Relevant Orders   EKG 12-Lead   Pro b natriuretic peptide (BNP) (Completed)   Basic metabolic panel (Completed)   CBC w/Diff (Completed)    Other Visit Diagnoses    SOB (shortness of breath)        Relevant Medications    metoCLOPramide (REGLAN) tablet    Other Relevant Orders    EKG 12-Lead    Pro b natriuretic peptide (BNP) (Completed)    Basic metabolic panel (Completed)    CBC w/Diff (Completed)       Orders Placed This Encounter  Procedures  . Pro b natriuretic peptide (BNP)  . Basic metabolic panel  . CBC w/Diff  . EKG 12-Lead    Order Specific Question:  Where should this test be performed    Answer:  OTHER   Meds ordered this encounter  Medications  . metoCLOPramide (REGLAN) 5 MG tablet    Sig: Take 1 tablet (5 mg total) by mouth 4 (four) times daily -  before meals and at bedtime.    Dispense:  28 tablet    Refill:  0    Followup: with Dr. Martinique or APP -    Also needs to see PCP for Nausea.  Leonie Man, M.D., M.S. Interventional Cardiologist   Pager # [phone]

## 2015-01-01 NOTE — Assessment & Plan Note (Signed)
Not really sure what to make of his current symptoms. He has not been on any weight, and doesn't have any PND orthopnea. He does have some exertional dyspnea, but I'm not sure what his baseline is. He is a relatively poor historian.  No edema, minimal JVD no HJR or pulsatile liver.  At this point, I will check a BNP to assess for any signs of increased LV filling pressures. If elevated would probably recommend short course of increased Lasix. Would also potentially consider an echocardiogram, but would defer to Dr. Martinique who knows the patient.

## 2015-01-01 NOTE — Telephone Encounter (Signed)
Spoke to patient. Results given . Verbalized understanding Patient aware to increase lasix for a few days.  Patient stated he is feeling better now. Patient states he thinks his problem was that he took a big dose of colchicine and that usually irritated his stomach.

## 2015-01-01 NOTE — Assessment & Plan Note (Signed)
Unfortunately, I don't know his baseline. He does have chronic combined systolic and diastolic heart failure.  Check BNP withCMP (in case Lasix titration is indicated) Consider Echo.

## 2015-01-01 NOTE — Telephone Encounter (Signed)
-----   Message from Leonie Man, MD sent at 01/01/2015  1:20 AM EDT ----- So - his BNP was elevated afterall.  Lets increase Lasix to 80 mg AM & 40 mg PM x 2 days then 40 mg BID x 4 days.  Reassess BNP on Monday. Will need f/u with Martinique or APP depending on Sx.  [place]

## 2015-01-01 NOTE — Assessment & Plan Note (Signed)
I'm not sure and left arm tingling, does not sound like angina with what to me. Would defer any further invasive evaluation at this point. Unfortunately he did not his blood pressure medications today, but he is on a good dose of carvedilol and lisinopril. He has had multiple PCI's. He is on aspirin plus Plavix for long-standing dual antibiotic therapy.

## 2015-01-06 DIAGNOSIS — Z79899 Other long term (current) drug therapy: Secondary | ICD-10-CM | POA: Diagnosis not present

## 2015-01-06 DIAGNOSIS — R7989 Other specified abnormal findings of blood chemistry: Secondary | ICD-10-CM | POA: Diagnosis not present

## 2015-01-06 DIAGNOSIS — R799 Abnormal finding of blood chemistry, unspecified: Secondary | ICD-10-CM | POA: Diagnosis not present

## 2015-01-06 DIAGNOSIS — R0609 Other forms of dyspnea: Secondary | ICD-10-CM | POA: Diagnosis not present

## 2015-01-06 DIAGNOSIS — I5042 Chronic combined systolic (congestive) and diastolic (congestive) heart failure: Secondary | ICD-10-CM | POA: Diagnosis not present

## 2015-01-07 LAB — PRO B NATRIURETIC PEPTIDE: Pro B Natriuretic peptide (BNP): 819.9 pg/mL — ABNORMAL HIGH (ref <451)

## 2015-01-10 ENCOUNTER — Other Ambulatory Visit: Payer: Self-pay | Admitting: Cardiology

## 2015-01-18 DIAGNOSIS — I2510 Atherosclerotic heart disease of native coronary artery without angina pectoris: Secondary | ICD-10-CM | POA: Diagnosis not present

## 2015-01-18 DIAGNOSIS — I259 Chronic ischemic heart disease, unspecified: Secondary | ICD-10-CM | POA: Diagnosis not present

## 2015-01-18 DIAGNOSIS — R0602 Shortness of breath: Secondary | ICD-10-CM | POA: Diagnosis not present

## 2015-01-18 DIAGNOSIS — I509 Heart failure, unspecified: Secondary | ICD-10-CM | POA: Diagnosis not present

## 2015-02-17 DIAGNOSIS — I509 Heart failure, unspecified: Secondary | ICD-10-CM | POA: Diagnosis not present

## 2015-02-17 DIAGNOSIS — R0602 Shortness of breath: Secondary | ICD-10-CM | POA: Diagnosis not present

## 2015-02-17 DIAGNOSIS — I259 Chronic ischemic heart disease, unspecified: Secondary | ICD-10-CM | POA: Diagnosis not present

## 2015-02-17 DIAGNOSIS — I2510 Atherosclerotic heart disease of native coronary artery without angina pectoris: Secondary | ICD-10-CM | POA: Diagnosis not present

## 2015-02-20 ENCOUNTER — Ambulatory Visit (INDEPENDENT_AMBULATORY_CARE_PROVIDER_SITE_OTHER): Payer: Commercial Managed Care - HMO | Admitting: Cardiology

## 2015-02-20 ENCOUNTER — Encounter: Payer: Self-pay | Admitting: Cardiology

## 2015-02-20 VITALS — BP 148/64 | HR 64 | Ht 72.0 in | Wt 219.2 lb

## 2015-02-20 DIAGNOSIS — N183 Chronic kidney disease, stage 3 unspecified: Secondary | ICD-10-CM

## 2015-02-20 DIAGNOSIS — I10 Essential (primary) hypertension: Secondary | ICD-10-CM | POA: Diagnosis not present

## 2015-02-20 DIAGNOSIS — I5042 Chronic combined systolic (congestive) and diastolic (congestive) heart failure: Secondary | ICD-10-CM | POA: Diagnosis not present

## 2015-02-20 DIAGNOSIS — I2510 Atherosclerotic heart disease of native coronary artery without angina pectoris: Secondary | ICD-10-CM

## 2015-02-20 MED ORDER — NITROGLYCERIN 0.4 MG SL SUBL: 0.4000 mg | SUBLINGUAL_TABLET | SUBLINGUAL | Status: AC | PRN

## 2015-02-20 NOTE — Patient Instructions (Signed)
Continue your current therapy  Monitor your weights- if your weight increases by 2-3 lbs I would take an extra lasix.  I will see you in 4 months with lab work.

## 2015-02-20 NOTE — Progress Notes (Signed)
And in  [name] Date of Birth: Jul 30, 1933 Medical Record [id number]  History of Present Illness: Castiel is seen today for follow up CHF. He has multiple issues which include chronic systolic and diastolic heart failure, HTN, HLD, insulin dependent DM, GERD, CAD with past CABG, stage 3 CKD, gout, UTI and dietary noncompliance.  In December 2013-  Echo showed EF 25 to 30% with mild MR, mod TR, mod RAE & LAE, moderate reduced RV systolic function and PASP of 13mmHg. He did undergo cardiac cath which showed his grafts to be patent, moderate pulmonary HTN, elevated LV and pulmonary wedge pressures. He was seen by Dr. Ellyn Hack on 12/30/14 with increased dyspnea, nausea, and arm tingling. He was volume overloaded. BNP up to 1500. Lasix increased to bid. Weight came down and he resumed prior dose. He thinks nausea related to colchicine which he was taking for gout at the time. He still eats out for most of his meals. Tries to watch his salt intake but it is difficult. His weight is up. No increase in edema, SOB, or chest pain. [facility] [employer] [facility] and either swims rides an exercise bike. Echo repeated 01/11/15 and showed improvement in EF to 35-40% with mild MR and moderate pulmonary HTN. BNP came down to 819.   Current Outpatient Prescriptions on File Prior to Visit  Medication Sig Dispense Refill  . allopurinol (ZYLOPRIM) 100 MG tablet Take 2 tablets (200 mg total) by mouth daily. 60 tablet 1  . aspirin 81 MG EC tablet Take 81 mg by mouth daily.      . carvedilol (COREG) 12.5 MG tablet Take 1 tablet (12.5 mg total) by mouth 2 (two) times daily with a meal. 60 tablet 6  . clopidogrel (PLAVIX) 75 MG tablet Take 1 tablet (75 mg total) by mouth daily. 30 tablet 0  . clopidogrel (PLAVIX) 75 MG tablet TAKE 1 TABLET BY MOUTH DAILY 30 tablet 1  . colchicine 0.6 MG tablet Take 0.6 mg by mouth 3 (three) times daily as needed. For gout pain    . Cyanocobalamin (VITAMIN B-12 PO) Take 1 tablet by mouth  daily.    . famotidine (PEPCID) 20 MG tablet Take 20 mg by mouth at bedtime.    . furosemide (LASIX) 40 MG tablet Take 1 tablet (40 mg total) by mouth 2 (two) times daily. (Patient taking differently: Take 40 mg by mouth daily. ) 60 tablet 6  . insulin NPH (HUMULIN N,NOVOLIN N) 100 UNIT/ML injection Inject 15 Units into the skin 2 (two) times daily before a meal.    . insulin regular (NOVOLIN R,HUMULIN R) 100 units/mL injection Inject 10 Units into the skin 2 (two) times daily before a meal.    . lisinopril (PRINIVIL,ZESTRIL) 5 MG tablet Take 1 tablet (5 mg total) by mouth daily. 30 tablet 6  . metoCLOPramide (REGLAN) 5 MG tablet Take 1 tablet (5 mg total) by mouth 4 (four) times daily -  before meals and at bedtime. 28 tablet 0  . Multiple Vitamin (MULTIVITAMIN) tablet Take 1 tablet by mouth daily.       No current facility-administered medications on file prior to visit.    No Known Allergies  Past Medical History  Diagnosis Date  . Hypertension   . Hyperkalemia   . Thrombocytopenia   . Dyslipidemia   . Arthritis   . Gout   . CAD (coronary artery disease)     a. 3v CABG 1997 b. PCI of  SVG to PL '05 c. PCI SVG to PL & SVG to PDA '11 d. Cath 08/2012 patent grafts e. Lexiscan Myoview 05/13/13: no evidence of ischemia or infarction, EF 44%  . Sleep apnea   . Cataract   . Chronic combined systolic and diastolic CHF (congestive heart failure) 08/2012    EF 50-55% 11/2011; 08/2012 - EF 25-30%,  with mild RM, mod TR, mod RAE & LAE, moderate reduced RV systolic function and PASP of 64mmHg. - > cath showed patent grafts & Mod Puml HTN wth elevated PCWP  . Diabetes mellitus     insulin dependent  . Chronic kidney disease     Stage3  . GERD (gastroesophageal reflux disease)   . Gout   . UTI (lower urinary tract infection) 08/2012    E.coli    Past Surgical History  Procedure Laterality Date  . Coronary artery bypass graft  1997     (LIMA to LAD, SVG to PDA, SVG to PL)  . Cardiac  catheterization  2005    stenting of the vein graft to the posterior lateral per -- Dr. Martinique      . Cardiac catheterization  2011    showed right coronary, totally occluded LAD and severe stenosis in both saphenous vein graft to the posterior lateral and posterior descending  -- stenting of the proximal portion of the SVG to the posterior lateral branch in 2/11  . Appendectomy    . Cholecystectomy    . Back surgery      15 years ago  . Partial colectomy      cancerous polyps  . Left and right heart catheterization with coronary/graft angiogram  09/04/2012    Procedure: LEFT AND RIGHT HEART CATHETERIZATION WITH Beatrix Fetters;  Surgeon: Sherren Mocha, MD;  Location: [facility] C[facility] LAB;  patent grafts, mod 2ndary pulmonary HTN, elevated LV EDP & PCWP    History  Smoking status  . Former Smoker -- 1.50 packs/day for 35 years  . Types: Cigarettes  . Quit date: 10/12/1983  Smokeless tobacco  . Never Used    History  Alcohol Use  . Yes    Comment: social only    Family History  Problem Relation Age of Onset  . Coronary artery disease Father   . Heart attack Father   . Coronary artery disease Brother     1/2 brother   . Kidney disease Sister   . Cardiomyopathy Mother   . Asthma Brother     Review of Systems: As noted in history of present illness.  All other systems were reviewed and are negative.  Physical Exam: BP 148/64 mmHg  Pulse 64  Ht 6' (1.829 m)  Wt 219 lb 3.2 oz (99.428 kg)  BMI 29.72 kg/m2 Patient is very pleasant and in no acute distress. Skin is warm and dry. Color is normal.  HEENT is unremarkable. Normocephalic/atraumatic. PERRL. Sclera are nonicteric. Neck is supple. No masses. No JVD. Lungs are clear. Cardiac exam shows a regular rate and rhythm. Abdomen is soft. It is not distended. No hepatosplenomegaly. Bowel sounds are positive. Extremities are without edema. Gait and ROM are intact. No gross neurologic deficits noted.   LABORATORY DATA:   Lab  Results  Component Value Date   WBC 9.5 12/30/2014   HGB 14.5 12/30/2014   HCT 45.7 12/30/2014   PLT 104* 12/30/2014   GLUCOSE 204* 12/30/2014   CHOL 118 08/06/2013   TRIG 237.0* 08/06/2013   HDL 47.60 08/06/2013   LDLDIRECT 29.8 08/06/2013  [place] 23 09/01/2012   ALT 29 08/06/2013   AST 27 08/06/2013   NA 138 12/30/2014   K 4.7 12/30/2014   CL 100 12/30/2014   CREATININE 1.29 12/30/2014   BUN 23 12/30/2014   CO2 27 12/30/2014   TSH 1.942 08/31/2012   INR 0.99 05/12/2013   HGBA1C 7.0* 08/31/2012   Echo Study Conclusions 01/11/15 Study Conclusions  - Left ventricle: The cavity size was moderately dilated. Wall thickness was increased in a pattern of moderate LVH. There was focal basal hypertrophy. Systolic function was moderately reduced. The estimated ejection fraction was in the range of 35% to 40%. Moderate hypokinesis. - Mitral valve: Mild regurgitation. - Left atrium: The atrium was mildly dilated. - Right atrium: The atrium was mildly to moderately dilated. - Pulmonary arteries: Systolic pressure was moderately increased. PA peak pressure: 53mm Hg (S).  Cardiac Cath Final Conclusions:   1. Severe three-vessel native coronary artery disease  2. Continued patency of the saphenous vein graft to PDA, saphenous vein graft to PLA, and LIMA to LAD  3. Moderate pulmonary hypertension likely related to left heart disease  4. Elevated left ventricular and pulmonary wedge pressure with large V waves   Recommendations: Continued medical therapy for treatment of congestive heart failure.  Sherren Mocha  09/04/2012, 3:25 PM   Assessment / Plan: 1. Chronic systolic and diastolic congestive heart care secondary to Ischemic CM - EF of 35-40% - he has major issues with compliance with sodium restriction. I recommended continuing his Lasix to 40 mg once day. If weight is up more than 2 lbs he is to take 40 mg bid.  Given his history of hyperkalemia he is not a good  candidate for Aldactone.  Follow up in 4 months with BMET and BNP  2. CAD - prior cath shows bypass grafts to be patent - no chest pain reported.   3. CKD -  He is on low dose ACE.   4. IDDM  5. Pulmonary HTN now moderate- improved from prior Echo.

## 2015-02-24 ENCOUNTER — Telehealth: Payer: Self-pay | Admitting: Cardiology

## 2015-02-24 NOTE — Telephone Encounter (Signed)
Returned call to patient no answer.LMTC. 

## 2015-02-24 NOTE — Telephone Encounter (Signed)
Please call,says he need to ask you about some blood work.

## 2015-02-24 NOTE — Telephone Encounter (Signed)
Returned call to patient.He stated he had lab work done at [place] [facility] in [place].Stated platelets 90.02/21/15 repeat 77.Stated he wanted to know if platelets have been low before.Advised

## 2015-02-24 NOTE — Telephone Encounter (Signed)
12/30/14 platelets 104.Stated he wanted to know if plavix could be causing.I will ask Dr.Jordan and call you back.

## 2015-02-25 NOTE — Telephone Encounter (Signed)
Pt is returning Cheryl's call from yesterday .

## 2015-02-25 NOTE — Telephone Encounter (Signed)
Returned call to patient Dr.Jordan advised to stop plavix.Continue aspirin 81 mg daily.Advised to see PCP about low platelets.

## 2015-02-27 ENCOUNTER — Ambulatory Visit (INDEPENDENT_AMBULATORY_CARE_PROVIDER_SITE_OTHER): Payer: Commercial Managed Care - HMO | Admitting: Pulmonary Disease

## 2015-02-27 ENCOUNTER — Encounter: Payer: Self-pay | Admitting: Pulmonary Disease

## 2015-02-27 VITALS — BP 142/72 | HR 62 | Temp 97.0°F | Ht 72.0 in | Wt 215.0 lb

## 2015-02-27 DIAGNOSIS — G4733 Obstructive sleep apnea (adult) (pediatric): Secondary | ICD-10-CM

## 2015-02-27 NOTE — Assessment & Plan Note (Signed)
The patient has been doing very well with his C Pap since the last visit, and is satisfied with his sleep and daytime alertness. I have asked him to keep up with his mask changes and supplies, and to follow-up again in one year.

## 2015-02-27 NOTE — Patient Instructions (Signed)
Continue on cpap, and keep up with mask changes and supplies Work on weight loss followup either here or [facility] in one year.

## 2015-02-27 NOTE — Progress Notes (Signed)
   Subjective:    Patient ID: Scott [name], male    DOB: 11/23/1932, 81 y.o.   MRN: [id number]  HPI The patient comes in today for follow-up of his obstructive sleep apnea. He is doing well with this EPAP device, and is having no issues with his mask fit or pressure. He is satisfied with his sleep and daytime alertness, and has maintained a stable weight since the last visit.   Review of Systems  Constitutional: Negative for fever and unexpected weight change.  HENT: Negative for congestion, dental problem, ear pain, nosebleeds, postnasal drip, rhinorrhea, sinus pressure, sneezing, sore throat and trouble swallowing.   Eyes: Negative for redness and itching.  Respiratory: Negative for cough, chest tightness, shortness of breath and wheezing.   Cardiovascular: Negative for palpitations and leg swelling.  Gastrointestinal: Negative for nausea and vomiting.  Genitourinary: Negative for dysuria.  Musculoskeletal: Negative for joint swelling.  Skin: Negative for rash.  Neurological: Negative for headaches.  Hematological: Does not bruise/bleed easily.  Psychiatric/Behavioral: Negative for dysphoric mood. The patient is not nervous/anxious.        Objective:   Physical Exam Overweight male in no acute distress Nose without purulence or discharge noted No skin breakdown or pressure necrosis from the C Pap mask Neck without lymphadenopathy or thyromegaly Lower extremities with minimal edema, no cyanosis Alert and oriented, moves all 4 extremities.       Assessment & Plan:

## 2015-03-05 ENCOUNTER — Encounter: Payer: Self-pay | Admitting: Cardiology

## 2015-03-20 DIAGNOSIS — I509 Heart failure, unspecified: Secondary | ICD-10-CM | POA: Diagnosis not present

## 2015-03-20 DIAGNOSIS — I2510 Atherosclerotic heart disease of native coronary artery without angina pectoris: Secondary | ICD-10-CM | POA: Diagnosis not present

## 2015-03-20 DIAGNOSIS — I259 Chronic ischemic heart disease, unspecified: Secondary | ICD-10-CM | POA: Diagnosis not present

## 2015-03-20 DIAGNOSIS — R0602 Shortness of breath: Secondary | ICD-10-CM | POA: Diagnosis not present

## 2015-04-19 DIAGNOSIS — I2510 Atherosclerotic heart disease of native coronary artery without angina pectoris: Secondary | ICD-10-CM | POA: Diagnosis not present

## 2015-04-19 DIAGNOSIS — I259 Chronic ischemic heart disease, unspecified: Secondary | ICD-10-CM | POA: Diagnosis not present

## 2015-04-19 DIAGNOSIS — I509 Heart failure, unspecified: Secondary | ICD-10-CM | POA: Diagnosis not present

## 2015-04-19 DIAGNOSIS — R0602 Shortness of breath: Secondary | ICD-10-CM | POA: Diagnosis not present

## 2015-06-24 ENCOUNTER — Ambulatory Visit (INDEPENDENT_AMBULATORY_CARE_PROVIDER_SITE_OTHER): Payer: Commercial Managed Care - HMO | Admitting: Cardiology

## 2015-06-24 ENCOUNTER — Encounter: Payer: Self-pay | Admitting: Cardiology

## 2015-06-24 VITALS — BP 140/64 | HR 58 | Ht 72.0 in | Wt 214.1 lb

## 2015-06-24 DIAGNOSIS — N183 Chronic kidney disease, stage 3 unspecified: Secondary | ICD-10-CM

## 2015-06-24 DIAGNOSIS — Z794 Long term (current) use of insulin: Secondary | ICD-10-CM

## 2015-06-24 DIAGNOSIS — E119 Type 2 diabetes mellitus without complications: Secondary | ICD-10-CM

## 2015-06-24 DIAGNOSIS — I2510 Atherosclerotic heart disease of native coronary artery without angina pectoris: Secondary | ICD-10-CM | POA: Diagnosis not present

## 2015-06-24 DIAGNOSIS — I255 Ischemic cardiomyopathy: Secondary | ICD-10-CM

## 2015-06-24 DIAGNOSIS — I5042 Chronic combined systolic (congestive) and diastolic (congestive) heart failure: Secondary | ICD-10-CM | POA: Diagnosis not present

## 2015-06-24 NOTE — Patient Instructions (Signed)
Continue your current therapy  I will see you in 4 months  

## 2015-06-24 NOTE — Progress Notes (Signed)
And in  [name] Date of Birth: 30-Jan-1933 Medical Record [id number]  History of Present Illness: Scott [name] is seen today for follow up CHF. He has multiple issues which include chronic systolic and diastolic heart failure, HTN, HLD, insulin dependent DM, GERD, CAD with past CABG, stage 3 CKD, gout, UTI and dietary noncompliance.  In December 2013-  Echo showed EF 25 to 30% with mild MR, mod TR, mod RAE & LAE, moderate reduced RV systolic function and PASP of 63mmHg. He did undergo cardiac cath which showed his grafts to be patent, moderate pulmonary HTN, elevated LV and pulmonary wedge pressures. He still eats out for most of his meals. Tries to watch his salt intake but it is difficult. His weight is stable. No increase in edema, SOB, or chest pain. [facility] still [facility] [employer] [facility] and either swims rides an exercise bike. Echo repeated 01/11/15 and showed improvement in EF to 35-40% with mild MR and moderate pulmonary HTN. He is taking lasix only once a day and can only remember one day when he took an extra dose. He is now being followed at the [facility] clinic in [place] and has regular lab work there.    Current Outpatient Prescriptions on File Prior to Visit  Medication Sig Dispense Refill  . allopurinol (ZYLOPRIM) 100 MG tablet Take 2 tablets (200 mg total) by mouth daily. 60 tablet 1  . aspirin 81 MG EC tablet Take 81 mg by mouth daily.      . carvedilol (COREG) 12.5 MG tablet Take 1 tablet (12.5 mg total) by mouth 2 (two) times daily with a meal. 60 tablet 6  . colchicine 0.6 MG tablet Take 0.6 mg by mouth 3 (three) times daily as needed. For gout pain    . Cyanocobalamin (VITAMIN B-12 PO) Take 1 tablet by mouth daily.    . famotidine (PEPCID) 20 MG tablet Take 20 mg by mouth at bedtime.    . furosemide (LASIX) 40 MG tablet Take 1 tablet (40 mg total) by mouth 2 (two) times daily. (Patient taking differently: Take 40 mg by mouth daily. ) 60 tablet 6  . insulin NPH (HUMULIN N,NOVOLIN N) 100  UNIT/ML injection Inject 15 Units into the skin 2 (two) times daily before a meal.    . insulin regular (NOVOLIN R,HUMULIN R) 100 units/mL injection Inject 10 Units into the skin 2 (two) times daily before a meal.    . lisinopril (PRINIVIL,ZESTRIL) 5 MG tablet Take 1 tablet (5 mg total) by mouth daily. 30 tablet 6  . metoCLOPramide (REGLAN) 5 MG tablet Take 1 tablet (5 mg total) by mouth 4 (four) times daily -  before meals and at bedtime. 28 tablet 0  . Multiple Vitamin (MULTIVITAMIN) tablet Take 1 tablet by mouth daily.      . nitroGLYCERIN (NITROSTAT) 0.4 MG SL tablet Place 1 tablet (0.4 mg total) under the tongue every 5 (five) minutes as needed for chest pain (up to 3 doses). 25 tablet 3   No current facility-administered medications on file prior to visit.    No Known Allergies  Past Medical History  Diagnosis Date  . Hypertension   . Hyperkalemia   . Thrombocytopenia   . Dyslipidemia   . Arthritis   . Gout   . CAD (coronary artery disease)     a. 3v CABG 1997 b. PCI of SVG to PL '05 c. PCI SVG to PL & SVG to PDA '11 d. Cath 08/2012 patent grafts  e. Lexiscan Myoview 05/13/13: no evidence of ischemia or infarction, EF 44%  . Sleep apnea   . Cataract   . Chronic combined systolic and diastolic CHF (congestive heart failure) 08/2012    EF 50-55% 11/2011; 08/2012 - EF 25-30%,  with mild RM, mod TR, mod RAE & LAE, moderate reduced RV systolic function and PASP of 55mmHg. - > cath showed patent grafts & Mod Puml HTN wth elevated PCWP  . Diabetes mellitus     insulin dependent  . Chronic kidney disease     Stage3  . GERD (gastroesophageal reflux disease)   . Gout   . UTI (lower urinary tract infection) 08/2012    E.coli    Past Surgical History  Procedure Laterality Date  . Coronary artery bypass graft  1997     (LIMA to LAD, SVG to PDA, SVG to PL)  . Cardiac catheterization  2005    stenting of the vein graft to the posterior lateral per -- Dr. Martinique      . Cardiac  catheterization  2011    showed right coronary, totally occluded LAD and severe stenosis in both saphenous vein graft to the posterior lateral and posterior descending  -- stenting of the proximal portion of the SVG to the posterior lateral branch in 2/11  . Appendectomy    . Cholecystectomy    . Back surgery      15 years ago  . Partial colectomy      cancerous polyps  . Left and right heart catheterization with coronary/graft angiogram  09/04/2012    Procedure: LEFT AND RIGHT HEART CATHETERIZATION WITH Beatrix Fetters;  Surgeon: Sherren Mocha, MD;  Location: [facility] CATH LAB;  patent grafts, mod 2ndary pulmonary HTN, elevated LV EDP & PCWP    History  Smoking status  . Former Smoker -- 1.50 packs/day for 35 years  . Types: Cigarettes  . Quit date: 10/12/1983  Smokeless tobacco  . Never Used    History  Alcohol Use  . Yes    Comment: social only    Family History  Problem Relation Age of Onset  . Coronary artery disease Father   . Heart attack Father   . Coronary artery disease Brother     1/2 brother   . Kidney disease Sister   . Cardiomyopathy Mother   . Asthma Brother     Review of Systems: As noted in history of present illness.  All other systems were reviewed and are negative.  Physical Exam: BP 140/64 mmHg  Pulse 58  Ht 6' (1.829 m)  Wt 97.115 kg (214 lb 1.6 oz)  BMI 29.03 kg/m2 Patient is very pleasant and in no acute distress. Skin is warm and dry. Color is normal.  HEENT is unremarkable. Normocephalic/atraumatic. PERRL. Sclera are nonicteric. Neck is supple. No masses. No JVD. Lungs are clear. Cardiac exam shows a regular rate and rhythm. Abdomen is soft. It is not distended. No hepatosplenomegaly. Bowel sounds are positive. Extremities are without edema. Gait and ROM are intact. No gross neurologic deficits noted.   LABORATORY DATA:   Lab Results  Component Value Date   WBC 9.5 12/30/2014   HGB 14.5 12/30/2014   HCT 45.7 12/30/2014   PLT 104*  12/30/2014   GLUCOSE 204* 12/30/2014   CHOL 118 08/06/2013   TRIG 237.0* 08/06/2013   HDL 47.60 08/06/2013   LDLDIRECT 29.8 08/06/2013   LDLCALC 23 09/01/2012   ALT 29 08/06/2013   AST 27 08/06/2013   NA 138  12/30/2014   K 4.7 12/30/2014   CL 100 12/30/2014   CREATININE 1.29 12/30/2014   BUN 23 12/30/2014   CO2 27 12/30/2014   TSH 1.942 08/31/2012   INR 0.99 05/12/2013   HGBA1C 7.0* 08/31/2012   Echo Study Conclusions 01/11/15 Study Conclusions  - Left ventricle: The cavity size was moderately dilated. Wall thickness was increased in a pattern of moderate LVH. There was focal basal hypertrophy. Systolic function was moderately reduced. The estimated ejection fraction was in the range of 35% to 40%. Moderate hypokinesis. - Mitral valve: Mild regurgitation. - Left atrium: The atrium was mildly dilated. - Right atrium: The atrium was mildly to moderately dilated. - Pulmonary arteries: Systolic pressure was moderately increased. PA peak pressure: 15mm Hg (S).  Cardiac Cath Final Conclusions:   1. Severe three-vessel native coronary artery disease  2. Continued patency of the saphenous vein graft to PDA, saphenous vein graft to PLA, and LIMA to LAD  3. Moderate pulmonary hypertension likely related to left heart disease  4. Elevated left ventricular and pulmonary wedge pressure with large V waves   Recommendations: Continued medical therapy for treatment of congestive heart failure.  Sherren Mocha  09/04/2012, 3:25 PM   Labs reviewed from the [facility]. Creatinine 1.9. Glucose 315 with A1c 8.0%. Platelets 107K.   Assessment / Plan: 1. Chronic systolic and diastolic congestive heart care secondary to Ischemic CM - EF of 35-40% - he has major issues with compliance with sodium restriction. I recommended continuing his Lasix to 40 mg once day. If weight is up more than 2 lbs he is to take 40 mg bid.  Given his history of hyperkalemia he is not a good candidate for Aldactone.   Follow up in 4 months.  2. CAD - prior cath shows bypass grafts to be patent - no chest pain reported.   3. CKD -  He is on low dose ACE. Labs monitored at the [facility]  4. IDDM- suboptimal control. Per primary care.   5. Pulmonary HTN now moderate- improved from prior Echo.

## 2015-10-20 DIAGNOSIS — I2510 Atherosclerotic heart disease of native coronary artery without angina pectoris: Secondary | ICD-10-CM | POA: Diagnosis not present

## 2015-10-20 DIAGNOSIS — I259 Chronic ischemic heart disease, unspecified: Secondary | ICD-10-CM | POA: Diagnosis not present

## 2015-10-20 DIAGNOSIS — I509 Heart failure, unspecified: Secondary | ICD-10-CM | POA: Diagnosis not present

## 2015-10-20 DIAGNOSIS — R0602 Shortness of breath: Secondary | ICD-10-CM | POA: Diagnosis not present

## 2015-11-14 ENCOUNTER — Ambulatory Visit (INDEPENDENT_AMBULATORY_CARE_PROVIDER_SITE_OTHER): Payer: Commercial Managed Care - HMO | Admitting: Cardiology

## 2015-11-14 ENCOUNTER — Encounter: Payer: Self-pay | Admitting: Cardiology

## 2015-11-14 VITALS — BP 142/72 | HR 58 | Ht 72.0 in | Wt 222.7 lb

## 2015-11-14 DIAGNOSIS — I2510 Atherosclerotic heart disease of native coronary artery without angina pectoris: Secondary | ICD-10-CM | POA: Diagnosis not present

## 2015-11-14 DIAGNOSIS — I5042 Chronic combined systolic (congestive) and diastolic (congestive) heart failure: Secondary | ICD-10-CM

## 2015-11-14 DIAGNOSIS — I10 Essential (primary) hypertension: Secondary | ICD-10-CM

## 2015-11-14 DIAGNOSIS — Z794 Long term (current) use of insulin: Secondary | ICD-10-CM

## 2015-11-14 DIAGNOSIS — E119 Type 2 diabetes mellitus without complications: Secondary | ICD-10-CM

## 2015-11-14 MED ORDER — LISINOPRIL 10 MG PO TABS: 10.0000 mg | ORAL_TABLET | Freq: Every day | ORAL | Status: DC

## 2015-11-14 NOTE — Progress Notes (Signed)
And in  Scott [name] Date of Birth: 10-Apr-1933 Medical Record [id number]  History of Present Illness: Scott [name] is seen today for follow up CHF. He has multiple issues which include chronic systolic and diastolic heart failure, HTN, HLD, insulin dependent DM, GERD, CAD with past CABG, stage 3 CKD, gout, UTI and dietary noncompliance.  In December 2013-  Echo showed EF 25 to 30% with mild MR, mod TR, mod RAE & LAE, moderate reduced RV systolic function and PASP of 8mmHg. He did undergo cardiac cath which showed his grafts to be patent, moderate pulmonary HTN, elevated LV and pulmonary wedge pressures. He still eats out for most of his meals. He finds it difficult to regulate his salt intake.  His weight has increased by 8 lbs. No increase in edema, SOB, or chest pain. He has only taken extra lasix once since his last visit. [facility] and either swims rides an exercise bike. Echo repeated 01/11/15 and showed improvement in EF to 35-40% with mild MR and moderate pulmonary HTN. His lab work is followed at the [facility]. Reports his sugars have been running high and insulin regimen has been changed.    Current Outpatient Prescriptions on File Prior to Visit  Medication Sig Dispense Refill  . allopurinol (ZYLOPRIM) 100 MG tablet Take 2 tablets (200 mg total) by mouth daily. 60 tablet 1  . aspirin 81 MG EC tablet Take 81 mg by mouth daily.      . carvedilol (COREG) 12.5 MG tablet Take 1 tablet (12.5 mg total) by mouth 2 (two) times daily with a meal. 60 tablet 6  . colchicine 0.6 MG tablet Take 0.6 mg by mouth 3 (three) times daily as needed. For gout pain    . Cyanocobalamin (VITAMIN B-12 PO) Take 1 tablet by mouth daily.    . famotidine (PEPCID) 20 MG tablet Take 20 mg by mouth at bedtime.    . furosemide (LASIX) 40 MG tablet Take 1 tablet (40 mg total) by mouth 2 (two) times daily. (Patient taking differently: Take 40 mg by mouth daily. ) 60 tablet 6  . insulin NPH (HUMULIN  N,NOVOLIN N) 100 UNIT/ML injection Inject 15 Units into the skin 2 (two) times daily before a meal.    . insulin regular (NOVOLIN R,HUMULIN R) 100 units/mL injection Inject 10 Units into the skin 2 (two) times daily before a meal.    . metoCLOPramide (REGLAN) 5 MG tablet Take 1 tablet (5 mg total) by mouth 4 (four) times daily -  before meals and at bedtime. 28 tablet 0  . Multiple Vitamin (MULTIVITAMIN) tablet Take 1 tablet by mouth daily.      . nitroGLYCERIN (NITROSTAT) 0.4 MG SL tablet Place 1 tablet (0.4 mg total) under the tongue every 5 (five) minutes as needed for chest pain (up to 3 doses). 25 tablet 3  . [DISCONTINUED] lisinopril (PRINIVIL,ZESTRIL) 5 MG tablet Take 1 tablet (5 mg total) by mouth daily. 30 tablet 6   No current facility-administered medications on file prior to visit.    No Known Allergies  Past Medical History  Diagnosis Date  . Hypertension   . Hyperkalemia   . Thrombocytopenia ([place])   . Dyslipidemia   . Arthritis   . Gout   . CAD (coronary artery disease)     a. 3v CABG 1997 b. PCI of SVG to PL '05 c. PCI SVG to PL & SVG to PDA '11 d. Cath 08/2012  patent grafts e. Lexiscan Myoview 05/13/13: no evidence of ischemia or infarction, EF 44%  . Sleep apnea   . Cataract   . Chronic combined systolic and diastolic CHF (congestive heart failure) ([place]) 08/2012    EF 50-55% 11/2011; 08/2012 - EF 25-30%,  with mild RM, mod TR, mod RAE & LAE, moderate reduced RV systolic function and PASP of 35mmHg. - > cath showed patent grafts & Mod Puml HTN wth elevated PCWP  . Diabetes mellitus     insulin dependent  . Chronic kidney disease     Stage3  . GERD (gastroesophageal reflux disease)   . Gout   . UTI (lower urinary tract infection) 08/2012    E.coli    Past Surgical History  Procedure Laterality Date  . Coronary artery bypass graft  1997     (LIMA to LAD, SVG to PDA, SVG to PL)  . Cardiac catheterization  2005    stenting of the vein graft to the posterior lateral  per -- Dr. Martinique      . Cardiac catheterization  2011    showed right coronary, totally occluded LAD and severe stenosis in both saphenous vein graft to the posterior lateral and posterior descending  -- stenting of the proximal portion of the SVG to the posterior lateral branch in 2/11  . Appendectomy    . Cholecystectomy    . Back surgery      15 years ago  . Partial colectomy      cancerous polyps  . Left and right heart catheterization with coronary/graft angiogram  09/04/2012    Procedure: LEFT AND RIGHT HEART CATHETERIZATION WITH Beatrix Fetters;  Surgeon: Sherren Mocha, MD;  Location: [facility] CATH LAB;  patent grafts, mod 2ndary pulmonary HTN, elevated LV EDP & PCWP    History  Smoking status  . Former Smoker -- 1.50 packs/day for 35 years  . Types: Cigarettes  . Quit date: 10/12/1983  Smokeless tobacco  . Never Used    History  Alcohol Use  . Yes    Comment: social only    Family History  Problem Relation Age of Onset  . Coronary artery disease Father   . Heart attack Father   . Coronary artery disease Brother     1/2 brother   . Kidney disease Sister   . Cardiomyopathy Mother   . Asthma Brother     Review of Systems: As noted in history of present illness.  All other systems were reviewed and are negative.  Physical Exam: BP 142/72 mmHg  Pulse 58  Ht 6' (1.829 m)  Wt 101.016 kg (222 lb 11.2 oz)  BMI 30.20 kg/m2 Patient is very pleasant and in no acute distress. Skin is warm and dry. Color is normal.  HEENT is unremarkable. Normocephalic/atraumatic. PERRL. Sclera are nonicteric. Neck is supple. No masses. No JVD. Lungs are clear. Cardiac exam shows a regular rate and rhythm. Normal S1-2. No gallop. Abdomen is soft. It is  distended. No hepatosplenomegaly. Bowel sounds are positive. Extremities reveal 1+ edema. Gait and ROM are intact. No gross neurologic deficits noted.   LABORATORY DATA:   Lab Results  Component Value Date   WBC 9.5 12/30/2014    HGB 14.5 12/30/2014   HCT 45.7 12/30/2014   PLT 104* 12/30/2014   GLUCOSE 204* 12/30/2014   CHOL 118 08/06/2013   TRIG 237.0* 08/06/2013   HDL 47.60 08/06/2013   LDLDIRECT 29.8 08/06/2013   LDLCALC 23 09/01/2012   ALT 29 08/06/2013  AST 27 08/06/2013   NA 138 12/30/2014   K 4.7 12/30/2014   CL 100 12/30/2014   CREATININE 1.29 12/30/2014   BUN 23 12/30/2014   CO2 27 12/30/2014   TSH 1.942 08/31/2012   INR 0.99 05/12/2013   HGBA1C 7.0* 08/31/2012   Echo Study Conclusions 01/11/15 Study Conclusions  - Left ventricle: The cavity size was moderately dilated. Wall thickness was increased in a pattern of moderate LVH. There was focal basal hypertrophy. Systolic function was moderately reduced. The estimated ejection fraction was in the range of 35% to 40%. Moderate hypokinesis. - Mitral valve: Mild regurgitation. - Left atrium: The atrium was mildly dilated. - Right atrium: The atrium was mildly to moderately dilated. - Pulmonary arteries: Systolic pressure was moderately increased. PA peak pressure: 47mm Hg (S).  Cardiac Cath Final Conclusions:   1. Severe three-vessel native coronary artery disease  2. Continued patency of the saphenous vein graft to PDA, saphenous vein graft to PLA, and LIMA to LAD  3. Moderate pulmonary hypertension likely related to left heart disease  4. Elevated left ventricular and pulmonary wedge pressure with large V waves   Recommendations: Continued medical therapy for treatment of congestive heart failure.  Sherren Mocha  09/04/2012, 3:25 PM   Ecg today shows NSR rate 58, first degree AV block. Right axis. ST-T changes consistent with inferolateral ischemia. This is unchanged from prior. I have personally reviewed and interpreted this study.   Assessment / Plan: 1. Chronic systolic and diastolic congestive heart failure secondary to Ischemic CM - EF of 35-40% - he has major issues with compliance with sodium restriction. I recommended  increasing his lasix to twice a day until weight is back to baseline. Will increase lisinopril to 10 mg daily.   Given his history of hyperkalemia he is not a good candidate for Aldactone.  Follow up in 4 months.  2. CAD - prior cath in 2013 showed bypass grafts to be patent - no chest pain reported.   3. CKD -   Labs monitored at the [place]  4. IDDM- suboptimal control. Per primary care.   5. Pulmonary HTN now moderate- improved from prior Echo.

## 2015-11-14 NOTE — Patient Instructions (Signed)
Increase lisinopril to 10 mg daily  You need to take lasix twice a day until your weight is back down to your baseline.  I will see you in 4 months.

## 2016-01-13 ENCOUNTER — Telehealth: Payer: Self-pay | Admitting: Cardiology

## 2016-01-13 MED ORDER — COLCHICINE 0.6 MG PO TABS: 0.6000 mg | ORAL_TABLET | Freq: Three times a day (TID) | ORAL | Status: DC | PRN

## 2016-01-13 NOTE — Telephone Encounter (Signed)
He is cleared from a cardiac standpoint for trigger finger surgery. We can prescribe colchicine 0.6 mg to take tid prn gout flair.  Rasheka Denard Martinique MD, [facility]

## 2016-01-13 NOTE — Telephone Encounter (Signed)
Received call from patient Dr.Jordan cleared you to have trigger finger surgery.Note faxed to Dominica Severin at [facility] fax # [phone].  Colchicine refill sent to pharmacy.

## 2016-01-13 NOTE — Telephone Encounter (Signed)
pt calling to ask that we send a fax to ok trigger finger surgery with [facility] Shu [facility]-pt will be partially under general anesthesia --pls fax to Att: Dominica Severin [phone]

## 2016-01-13 NOTE — Telephone Encounter (Signed)
Returned call to patient.He stated he needs surgical clearance from Dr.Jordan to have surgery on left little finger to be done at [place] in [facility] 02/02/16.Also wants to ask Dr.Jordan if ok to refill Colchicine 0.6 mg three times a day as needed for gout flare ups.Stated he was in a gout study and he received colchicine,study has ended.Message sent to [place].

## 2016-01-13 NOTE — Telephone Encounter (Signed)
Returned call to patient no answer.LMTC. 

## 2016-01-13 NOTE — Addendum Note (Signed)
Addended by: Kathyrn Lass on: 01/13/2016 03:47 PM   Modules accepted: Orders

## 2016-02-25 DIAGNOSIS — M720 Palmar fascial fibromatosis [Dupuytren]: Secondary | ICD-10-CM | POA: Insufficient documentation

## 2016-03-17 ENCOUNTER — Ambulatory Visit (INDEPENDENT_AMBULATORY_CARE_PROVIDER_SITE_OTHER): Payer: Commercial Managed Care - HMO | Admitting: Cardiology

## 2016-03-17 ENCOUNTER — Encounter: Payer: Self-pay | Admitting: Cardiology

## 2016-03-17 VITALS — BP 154/69 | HR 56 | Ht 72.0 in | Wt 218.4 lb

## 2016-03-17 DIAGNOSIS — I2510 Atherosclerotic heart disease of native coronary artery without angina pectoris: Secondary | ICD-10-CM

## 2016-03-17 DIAGNOSIS — I255 Ischemic cardiomyopathy: Secondary | ICD-10-CM | POA: Diagnosis not present

## 2016-03-17 DIAGNOSIS — I5042 Chronic combined systolic (congestive) and diastolic (congestive) heart failure: Secondary | ICD-10-CM | POA: Diagnosis not present

## 2016-03-17 DIAGNOSIS — Z794 Long term (current) use of insulin: Secondary | ICD-10-CM

## 2016-03-17 DIAGNOSIS — E119 Type 2 diabetes mellitus without complications: Secondary | ICD-10-CM

## 2016-03-17 DIAGNOSIS — N183 Chronic kidney disease, stage 3 unspecified: Secondary | ICD-10-CM

## 2016-03-17 DIAGNOSIS — I10 Essential (primary) hypertension: Secondary | ICD-10-CM

## 2016-03-17 NOTE — Progress Notes (Signed)
And in  [name] Date of Birth: 1932-12-20 Medical Record [id number]  History of Present Illness: Scott [name] is seen today for follow up CAD and CHF. He has multiple issues which include chronic systolic and diastolic heart failure, HTN, HLD, insulin dependent DM, GERD, CAD with past CABG, stage 3 CKD, gout, UTI and dietary noncompliance.  In December 2013-  Echo showed EF 25 to 30% with mild MR, mod TR, mod RAE & LAE, moderate reduced RV systolic function and PASP of 15mmHg. He did undergo cardiac cath which showed his grafts to be patent, moderate pulmonary HTN, elevated LV and pulmonary wedge pressures. He still eats out for most of his meals. He is really unable to monitor his salt intake.  His weight has been stable. He takes extra lasix if needed. No increase in edema, SOB, or chest pain.[facility] and either swims rides an exercise bike. Echo repeated 01/11/15 and showed improvement in EF to 35-40% with mild MR and moderate pulmonary HTN. His lab work is followed at the [facility]. Reports his sugars still run high. He does use CPAP at night.    Current Outpatient Prescriptions on File Prior to Visit  Medication Sig Dispense Refill  . allopurinol (ZYLOPRIM) 100 MG tablet Take 2 tablets (200 mg total) by mouth daily. 60 tablet 1  . aspirin 81 MG EC tablet Take 81 mg by mouth daily.      . carvedilol (COREG) 12.5 MG tablet Take 1 tablet (12.5 mg total) by mouth 2 (two) times daily with a meal. 60 tablet 6  . colchicine 0.6 MG tablet Take 1 tablet (0.6 mg total) by mouth 3 (three) times daily as needed. For gout pain 60 tablet 6  . Cyanocobalamin (VITAMIN B-12 PO) Take 1 tablet by mouth daily.    . famotidine (PEPCID) 20 MG tablet Take 20 mg by mouth at bedtime.    . furosemide (LASIX) 40 MG tablet Take 1 tablet (40 mg total) by mouth 2 (two) times daily. 60 tablet 6  . insulin NPH (HUMULIN N,NOVOLIN N) 100 UNIT/ML injection Inject 15 Units into the skin 2 (two)  times daily before a meal.    . insulin regular (NOVOLIN R,HUMULIN R) 100 units/mL injection Inject 10 Units into the skin 2 (two) times daily before a meal.    . lisinopril (PRINIVIL,ZESTRIL) 10 MG tablet Take 1 tablet (10 mg total) by mouth daily. 90 tablet 3  . metoCLOPramide (REGLAN) 5 MG tablet Take 1 tablet (5 mg total) by mouth 4 (four) times daily -  before meals and at bedtime. 28 tablet 0  . Multiple Vitamin (MULTIVITAMIN) tablet Take 1 tablet by mouth daily.      . nitroGLYCERIN (NITROSTAT) 0.4 MG SL tablet Place 1 tablet (0.4 mg total) under the tongue every 5 (five) minutes as needed for chest pain (up to 3 doses). 25 tablet 3   No current facility-administered medications on file prior to visit.    No Known Allergies  Past Medical History  Diagnosis Date  . Hypertension   . Hyperkalemia   . Thrombocytopenia ([place])   . Dyslipidemia   . Arthritis   . Gout   . CAD (coronary artery disease)     a. 3v CABG 1997 b. PCI of SVG to PL '05 c. PCI SVG to PL & SVG to PDA '11 d. Cath 08/2012 patent grafts e. Lexiscan Myoview 05/13/13: no evidence of ischemia or infarction, EF 44%  .  Sleep apnea   . Cataract   . Chronic combined systolic and diastolic CHF (congestive heart failure) ([place]) 08/2012    EF 50-55% 11/2011; 08/2012 - EF 25-30%,  with mild RM, mod TR, mod RAE & LAE, moderate reduced RV systolic function and PASP of 69mmHg. - > cath showed patent grafts & Mod Puml HTN wth elevated PCWP  . Diabetes mellitus     insulin dependent  . Chronic kidney disease     Stage3  . GERD (gastroesophageal reflux disease)   . Gout   . UTI (lower urinary tract infection) 08/2012    E.coli    Past Surgical History  Procedure Laterality Date  . Coronary artery bypass graft  1997     (LIMA to LAD, SVG to PDA, SVG to PL)  . Cardiac catheterization  2005    stenting of the vein graft to the posterior lateral per -- Dr. Martinique      . Cardiac catheterization  2011    showed right coronary,  totally occluded LAD and severe stenosis in both saphenous vein graft to the posterior lateral and posterior descending  -- stenting of the proximal portion of the SVG to the posterior lateral branch in 2/11  . Appendectomy    . Cholecystectomy    . Back surgery      15 years ago  . Partial colectomy      cancerous polyps  . Left and right heart catheterization with coronary/graft angiogram  09/04/2012    Procedure: LEFT AND RIGHT HEART CATHETERIZATION WITH Beatrix Fetters;  Surgeon: Sherren Mocha, MD;  Location: [facility] CATH LAB;  patent grafts, mod 2ndary pulmonary HTN, elevated LV EDP & PCWP    History  Smoking status  . Former Smoker -- 1.50 packs/day for 35 years  . Types: Cigarettes  . Quit date: 10/12/1983  Smokeless tobacco  . Never Used    History  Alcohol Use  . 0.0 oz/week  . 0 Standard drinks or equivalent per week    Comment: social only    Family History  Problem Relation Age of Onset  . Coronary artery disease Father   . Heart attack Father   . Coronary artery disease Brother     1/2 brother   . Kidney disease Sister   . Cardiomyopathy Mother   . Asthma Brother     Review of Systems: As noted in history of present illness.  All other systems were reviewed and are negative.  Physical Exam: BP 154/69 mmHg  Pulse 56  Ht 6' (1.829 m)  Wt 218 lb 6.4 oz (99.066 kg)  BMI 29.61 kg/m2 Patient is very pleasant and in no acute distress. Skin is warm and dry. Color is normal.  HEENT is unremarkable. Normocephalic/atraumatic. PERRL. Sclera are nonicteric. Neck is supple. No masses. No JVD. Lungs are clear. Cardiac exam shows a regular rate and rhythm. Normal S1-2. No gallop. Abdomen is soft. It is  distended. No hepatosplenomegaly. Bowel sounds are positive. Extremities reveal no edema. Gait and ROM are intact. No gross neurologic deficits noted.   LABORATORY DATA:   Lab Results  Component Value Date   WBC 9.5 12/30/2014   HGB 14.5 12/30/2014   HCT 45.7  12/30/2014   PLT 104* 12/30/2014   GLUCOSE 204* 12/30/2014   CHOL 118 08/06/2013   TRIG 237.0* 08/06/2013   HDL 47.60 08/06/2013   LDLDIRECT 29.8 08/06/2013   LDLCALC 23 09/01/2012   ALT 29 08/06/2013   AST 27 08/06/2013   NA  138 12/30/2014   K 4.7 12/30/2014   CL 100 12/30/2014   CREATININE 1.29 12/30/2014   BUN 23 12/30/2014   CO2 27 12/30/2014   TSH 1.942 08/31/2012   INR 0.99 05/12/2013   HGBA1C 7.0* 08/31/2012   Echo Study Conclusions 01/11/15 Study Conclusions  - Left ventricle: The cavity size was moderately dilated. Wall thickness was increased in a pattern of moderate LVH. There was focal basal hypertrophy. Systolic function was moderately reduced. The estimated ejection fraction was in the range of 35% to 40%. Moderate hypokinesis. - Mitral valve: Mild regurgitation. - Left atrium: The atrium was mildly dilated. - Right atrium: The atrium was mildly to moderately dilated. - Pulmonary arteries: Systolic pressure was moderately increased. PA peak pressure: 5mm Hg (S).   Ecg not done today    Assessment / Plan: 1. Chronic systolic and diastolic congestive heart failure secondary to Ischemic CM - EF of 35-40% - he has difficulty with compliance with sodium restriction since he does not cook. Will continue lasix 80 mg daily. Continue Coreg and lisinopril.  Given his history of hyperkalemia he is not a good candidate for Aldactone.  Follow up in 4 months.  2. CAD - prior cath in 2013 showed bypass grafts to be patent - no chest pain reported.   3. CKD -   Labs monitored at the [place]  4. IDDM- suboptimal control. Per primary care.   5. Pulmonary HTN now moderate  6. OSA on CPAP.

## 2016-03-17 NOTE — Patient Instructions (Signed)
Continue your current therapy  I will see you in 4-6 months

## 2016-04-18 DIAGNOSIS — I259 Chronic ischemic heart disease, unspecified: Secondary | ICD-10-CM | POA: Diagnosis not present

## 2016-04-18 DIAGNOSIS — R0602 Shortness of breath: Secondary | ICD-10-CM | POA: Diagnosis not present

## 2016-04-18 DIAGNOSIS — I509 Heart failure, unspecified: Secondary | ICD-10-CM | POA: Diagnosis not present

## 2016-04-18 DIAGNOSIS — I2510 Atherosclerotic heart disease of native coronary artery without angina pectoris: Secondary | ICD-10-CM | POA: Diagnosis not present

## 2016-10-19 DIAGNOSIS — R0602 Shortness of breath: Secondary | ICD-10-CM | POA: Diagnosis not present

## 2016-10-19 DIAGNOSIS — I259 Chronic ischemic heart disease, unspecified: Secondary | ICD-10-CM | POA: Diagnosis not present

## 2016-10-19 DIAGNOSIS — I2510 Atherosclerotic heart disease of native coronary artery without angina pectoris: Secondary | ICD-10-CM | POA: Diagnosis not present

## 2016-10-19 DIAGNOSIS — I509 Heart failure, unspecified: Secondary | ICD-10-CM | POA: Diagnosis not present

## 2017-03-04 ENCOUNTER — Other Ambulatory Visit: Payer: Self-pay | Admitting: *Deleted

## 2017-03-04 MED ORDER — COLCHICINE 0.6 MG PO TABS: 0.6000 mg | ORAL_TABLET | Freq: Three times a day (TID) | ORAL | 6 refills | Status: DC | PRN

## 2017-03-04 NOTE — Telephone Encounter (Signed)
REFILL 

## 2017-04-18 DIAGNOSIS — I259 Chronic ischemic heart disease, unspecified: Secondary | ICD-10-CM | POA: Diagnosis not present

## 2017-04-18 DIAGNOSIS — I2510 Atherosclerotic heart disease of native coronary artery without angina pectoris: Secondary | ICD-10-CM | POA: Diagnosis not present

## 2017-04-18 DIAGNOSIS — R0602 Shortness of breath: Secondary | ICD-10-CM | POA: Diagnosis not present

## 2017-04-18 DIAGNOSIS — I509 Heart failure, unspecified: Secondary | ICD-10-CM | POA: Diagnosis not present

## 2017-05-21 ENCOUNTER — Observation Stay (HOSPITAL_COMMUNITY)
Admission: EM | Admit: 2017-05-21 | Discharge: 2017-05-22 | Disposition: A | Discharge status: Home / Self Care | Payer: Medicare HMO | Attending: Cardiology | Admitting: Cardiology

## 2017-05-21 ENCOUNTER — Encounter (HOSPITAL_COMMUNITY): Payer: Self-pay | Admitting: Emergency Medicine

## 2017-05-21 ENCOUNTER — Emergency Department (HOSPITAL_COMMUNITY): Payer: Medicare HMO

## 2017-05-21 DIAGNOSIS — I129 Hypertensive chronic kidney disease with stage 1 through stage 4 chronic kidney disease, or unspecified chronic kidney disease: Secondary | ICD-10-CM | POA: Insufficient documentation

## 2017-05-21 DIAGNOSIS — I2510 Atherosclerotic heart disease of native coronary artery without angina pectoris: Secondary | ICD-10-CM | POA: Diagnosis present

## 2017-05-21 DIAGNOSIS — Z794 Long term (current) use of insulin: Secondary | ICD-10-CM

## 2017-05-21 DIAGNOSIS — G4733 Obstructive sleep apnea (adult) (pediatric): Secondary | ICD-10-CM | POA: Diagnosis present

## 2017-05-21 DIAGNOSIS — I110 Hypertensive heart disease with heart failure: Secondary | ICD-10-CM | POA: Insufficient documentation

## 2017-05-21 DIAGNOSIS — I5042 Chronic combined systolic (congestive) and diastolic (congestive) heart failure: Secondary | ICD-10-CM | POA: Insufficient documentation

## 2017-05-21 DIAGNOSIS — I10 Essential (primary) hypertension: Secondary | ICD-10-CM | POA: Diagnosis present

## 2017-05-21 DIAGNOSIS — Z87891 Personal history of nicotine dependence: Secondary | ICD-10-CM | POA: Diagnosis not present

## 2017-05-21 DIAGNOSIS — R079 Chest pain, unspecified: Secondary | ICD-10-CM | POA: Diagnosis not present

## 2017-05-21 DIAGNOSIS — Z79899 Other long term (current) drug therapy: Secondary | ICD-10-CM | POA: Insufficient documentation

## 2017-05-21 DIAGNOSIS — R9431 Abnormal electrocardiogram [ECG] [EKG]: Secondary | ICD-10-CM | POA: Diagnosis not present

## 2017-05-21 DIAGNOSIS — E119 Type 2 diabetes mellitus without complications: Secondary | ICD-10-CM

## 2017-05-21 DIAGNOSIS — R0789 Other chest pain: Secondary | ICD-10-CM | POA: Insufficient documentation

## 2017-05-21 DIAGNOSIS — N183 Chronic kidney disease, stage 3 unspecified: Secondary | ICD-10-CM | POA: Diagnosis present

## 2017-05-21 DIAGNOSIS — I200 Unstable angina: Secondary | ICD-10-CM | POA: Diagnosis not present

## 2017-05-21 DIAGNOSIS — Z7982 Long term (current) use of aspirin: Secondary | ICD-10-CM | POA: Insufficient documentation

## 2017-05-21 DIAGNOSIS — I5043 Acute on chronic combined systolic (congestive) and diastolic (congestive) heart failure: Secondary | ICD-10-CM | POA: Diagnosis present

## 2017-05-21 DIAGNOSIS — E785 Hyperlipidemia, unspecified: Secondary | ICD-10-CM | POA: Diagnosis present

## 2017-05-21 DIAGNOSIS — I255 Ischemic cardiomyopathy: Secondary | ICD-10-CM | POA: Diagnosis present

## 2017-05-21 HISTORY — DX: Chronic kidney disease, stage 3 unspecified: N18.30

## 2017-05-21 HISTORY — DX: Chronic kidney disease, stage 3 (moderate): N18.3

## 2017-05-21 LAB — CBC
HCT: 43.6 % (ref 39.0–52.0)
Hemoglobin: 13.8 g/dL (ref 13.0–17.0)
MCH: 29.6 pg (ref 26.0–34.0)
MCHC: 31.7 g/dL (ref 30.0–36.0)
MCV: 93.4 fL (ref 78.0–100.0)
Platelets: 73 10*3/uL — ABNORMAL LOW (ref 150–400)
RBC: 4.67 MIL/uL (ref 4.22–5.81)
RDW: 15 % (ref 11.5–15.5)
WBC: 7 10*3/uL (ref 4.0–10.5)

## 2017-05-21 LAB — GLUCOSE, CAPILLARY
Glucose-Capillary: 163 mg/dL — ABNORMAL HIGH (ref 65–99)
Glucose-Capillary: 194 mg/dL — ABNORMAL HIGH (ref 65–99)

## 2017-05-21 LAB — TROPONIN I
Troponin I: <0.03 ng/mL (ref <0.03)
Troponin I: 0.04 ng/mL (ref <0.03)

## 2017-05-21 LAB — I-STAT TROPONIN, ED: Troponin i, poc: 0.02 ng/mL (ref 0.00–0.08)

## 2017-05-21 LAB — BASIC METABOLIC PANEL
Anion gap: 9 (ref 5–15)
BUN: 28 mg/dL — ABNORMAL HIGH (ref 6–20)
CO2: 25 mmol/L (ref 22–32)
Calcium: 9.2 mg/dL (ref 8.9–10.3)
Chloride: 103 mmol/L (ref 101–111)
Creatinine, Ser: 1.46 mg/dL — ABNORMAL HIGH (ref 0.61–1.24)
GFR calc Af Amer: 50 mL/min — ABNORMAL LOW (ref >60)
GFR calc non Af Amer: 43 mL/min — ABNORMAL LOW (ref >60)
Glucose, Bld: 188 mg/dL — ABNORMAL HIGH (ref 65–99)
Potassium: 4.6 mmol/L (ref 3.5–5.1)
Sodium: 137 mmol/L (ref 135–145)

## 2017-05-21 MED ORDER — LISINOPRIL 10 MG PO TABS
10.0000 mg | ORAL_TABLET | Freq: Every day | ORAL | Status: DC
  Administered 2017-05-21 – 2017-05-22 (×2): 10 mg via ORAL
  Filled 2017-05-21 (×2): qty 1

## 2017-05-21 MED ORDER — ASPIRIN EC 81 MG PO TBEC
81.0000 mg | DELAYED_RELEASE_TABLET | Freq: Every day | ORAL | Status: DC
  Administered 2017-05-22: 81 mg via ORAL
  Filled 2017-05-21 (×2): qty 1

## 2017-05-21 MED ORDER — ONDANSETRON HCL 4 MG/2ML IJ SOLN: 4.0000 mg | Freq: Four times a day (QID) | INTRAMUSCULAR | Status: DC | PRN

## 2017-05-21 MED ORDER — ASPIRIN 81 MG PO CHEW
324.0000 mg | CHEWABLE_TABLET | Freq: Once | ORAL | Status: AC
  Administered 2017-05-21: 324 mg via ORAL
  Filled 2017-05-21: qty 4

## 2017-05-21 MED ORDER — REGADENOSON 0.4 MG/5ML IV SOLN
0.4000 mg | Freq: Once | INTRAVENOUS | Status: AC
  Administered 2017-05-22: 0.4 mg via INTRAVENOUS
  Filled 2017-05-21: qty 5

## 2017-05-21 MED ORDER — ALLOPURINOL 100 MG PO TABS
200.0000 mg | ORAL_TABLET | Freq: Every day | ORAL | Status: DC
  Administered 2017-05-21 – 2017-05-22 (×2): 200 mg via ORAL
  Filled 2017-05-21 (×2): qty 2

## 2017-05-21 MED ORDER — ACETAMINOPHEN 325 MG PO TABS: 650.0000 mg | ORAL_TABLET | ORAL | Status: DC | PRN

## 2017-05-21 MED ORDER — NITROGLYCERIN 0.4 MG SL SUBL: 0.4000 mg | SUBLINGUAL_TABLET | SUBLINGUAL | Status: DC | PRN

## 2017-05-21 MED ORDER — FAMOTIDINE 20 MG PO TABS
20.0000 mg | ORAL_TABLET | Freq: Every day | ORAL | Status: DC
  Administered 2017-05-21: 20 mg via ORAL
  Filled 2017-05-21: qty 1

## 2017-05-21 MED ORDER — METOCLOPRAMIDE HCL 5 MG PO TABS
5.0000 mg | ORAL_TABLET | Freq: Three times a day (TID) | ORAL | Status: DC
  Administered 2017-05-21 – 2017-05-22 (×4): 5 mg via ORAL
  Filled 2017-05-21 (×4): qty 1

## 2017-05-21 MED ORDER — CARVEDILOL 12.5 MG PO TABS
12.5000 mg | ORAL_TABLET | Freq: Two times a day (BID) | ORAL | Status: DC
  Administered 2017-05-21: 12.5 mg via ORAL
  Filled 2017-05-21: qty 1

## 2017-05-21 MED ORDER — INSULIN NPH (HUMAN) (ISOPHANE) 100 UNIT/ML ~~LOC~~ SUSP
15.0000 [IU] | Freq: Two times a day (BID) | SUBCUTANEOUS | Status: DC
  Administered 2017-05-21: 15 [IU] via SUBCUTANEOUS
  Filled 2017-05-21: qty 10

## 2017-05-21 MED ORDER — FUROSEMIDE 40 MG PO TABS
40.0000 mg | ORAL_TABLET | Freq: Two times a day (BID) | ORAL | Status: DC
  Administered 2017-05-21 – 2017-05-22 (×2): 40 mg via ORAL
  Filled 2017-05-21 (×2): qty 1

## 2017-05-21 MED ORDER — ATORVASTATIN CALCIUM 40 MG PO TABS
40.0000 mg | ORAL_TABLET | Freq: Every day | ORAL | Status: DC
  Administered 2017-05-21: 40 mg via ORAL
  Filled 2017-05-21: qty 1

## 2017-05-21 MED ORDER — INSULIN ASPART 100 UNIT/ML ~~LOC~~ SOLN
0.0000 [IU] | Freq: Three times a day (TID) | SUBCUTANEOUS | Status: DC
  Administered 2017-05-21: 3 [IU] via SUBCUTANEOUS
  Administered 2017-05-22: 2 [IU] via SUBCUTANEOUS
  Administered 2017-05-22: 3 [IU] via SUBCUTANEOUS

## 2017-05-21 NOTE — ED Triage Notes (Signed)
Pt to ER for onset of substernal chest discomfort and epigastric discomfort last night while at rest. States no radiation but is relieved with nitro. States took aspirin and nitro last night with relief. This morning the pain began again, took another nitro and it was relieved. Hx of stent placement. NAD at triage. Rates pain 2/10, states was 6/10 before nitro. VSS.

## 2017-05-21 NOTE — ED Provider Notes (Signed)
[facility] DEPT Provider Note   CSN: [id number] Arrival date & time: 05/21/17  0757     History   Chief Complaint Chief Complaint  Patient presents with  . Chest Pain    HPI Scott [name] is a 83 y.o. male.  83 yo M with a chief complaint of chest pain. This started last night severity with some diaphoresis. Felt that it was epigastric in nature and then radiated across the top of his abdomen. Feels similar to his prior MI but slightly lower. Improved significantly with nitroglycerin and so he did not seek medical care. Pain reoccurred this morning. No diaphoresis with it, felt mildly weak. Unsure if anything worse on exertion because he cannot exert himself. Denies lower extremity edema. Denies cough congestion or fever. Denies prior PE or DVT.   The history is provided by the patient.  Chest Pain   This is a new problem. The current episode started yesterday. The problem occurs daily. The problem has been gradually worsening. The pain is present in the substernal region. The pain is at a severity of 9/10. The pain is moderate. The quality of the pain is described as brief. The pain does not radiate. Duration of episode(s) is 2 days. Associated symptoms include diaphoresis. Pertinent negatives include no abdominal pain, no fever, no headaches, no palpitations, no shortness of breath and no vomiting. He has tried nothing for the symptoms. The treatment provided no relief.  His past medical history is significant for MI.    Past Medical History:  Diagnosis Date  . Arthritis   . CAD (coronary artery disease)    a. 1997 CABGx3 (VG->RPDA, VG->PLV, LIMA->LAD;  b. 07/2004 PCI VG->PLV (3.5x16 Taxus DES, 3.5x12 Taxus DES); c. 11/2009 PCI: VG->PLV 3.5x12 Promus DES), VG->RPDA (3.5x15 Promus DES); d. 08/2012 Cath: patent grafts; e. 05/2013 Lexiscan MV: no ischemia/infarction, EF 44%.  . Cataract   . Chronic combined systolic and diastolic CHF (congestive heart failure) ([place]) 08/2012   a. EF  50-55% 11/2011; b. 08/2012 - EF 25-30%,  with mild RM, mod TR, mod RAE & LAE, moderate reduced RV systolic function and PASP of 66mmHg. - > cath showed patent grafts & Mod Puml HTN wth elevated PCWP;  c. 01/2014 Echo: EF 35-40%, mod LVH, mod HK, mildly dil LA, mod dil RA, PASP 39mmHg.  . CKD (chronic kidney disease), stage III   . Dyslipidemia   . GERD (gastroesophageal reflux disease)   . Gout   . Hyperkalemia   . Hypertension   . Insulin Dependent Diabetes mellitus   . Sleep apnea   . Thrombocytopenia ([place])   . UTI (lower urinary tract infection) 08/2012   E.coli    Patient Active Problem List   Diagnosis Date Noted  . Nausea - without vomiting 01/01/2015  . Low grade fever 01/01/2015  . DOE (dyspnea on exertion) 01/01/2015  . Unstable angina ([place]) 05/12/2013  . Chronic combined systolic and diastolic heart failure ([place]) 05/12/2013  . CKD (chronic kidney disease) stage 3, GFR 30-59 ml/min 09/01/2012  . OSA (obstructive sleep apnea) 07/04/2012  . Dyspnea 11/05/2011  . Diabetes mellitus, type II, insulin dependent ([place]) 04/07/2011  . Hypertension   . Ischemic cardiomyopathy   . Myocardial infarction ([place])   . Dyslipidemia   . Arteriosclerotic coronary artery disease     Past Surgical History:  Procedure Laterality Date  . APPENDECTOMY    . BACK SURGERY     15 years ago  . CARDIAC CATHETERIZATION  2005  stenting of the vein graft to the posterior lateral per -- Dr. Martinique      . CARDIAC CATHETERIZATION  2011   showed right coronary, totally occluded LAD and severe stenosis in both saphenous vein graft to the posterior lateral and posterior descending  -- stenting of the proximal portion of the SVG to the posterior lateral branch in 2/11  . CHOLECYSTECTOMY    . CORONARY ARTERY BYPASS GRAFT  1997    (LIMA to LAD, SVG to PDA, SVG to PL)  . LEFT AND RIGHT HEART CATHETERIZATION WITH CORONARY/GRAFT ANGIOGRAM  09/04/2012   Procedure: LEFT AND RIGHT HEART CATHETERIZATION WITH  Beatrix Fetters;  Surgeon: Sherren Mocha, MD;  Location: [facility] CATH LAB;  patent grafts, mod 2ndary pulmonary HTN, elevated LV EDP & PCWP  . PARTIAL COLECTOMY     cancerous polyps       Home Medications    Prior to Admission medications   Medication Sig Start Date End Date Taking? Authorizing Provider  allopurinol (ZYLOPRIM) 100 MG tablet Take 2 tablets (200 mg total) by mouth daily. 09/05/12  Yes Hope, Jessica A, PA-C  aspirin 81 MG EC tablet Take 81 mg by mouth daily.     Yes [provider]  atorvastatin (LIPITOR) 80 MG tablet Take 80 mg by mouth every morning.   Yes [provider]  carvedilol (COREG) 12.5 MG tablet Take 1 tablet (12.5 mg total) by mouth 2 (two) times daily with a meal. 09/05/12  Yes Hope, Jessica A, PA-C  colchicine 0.6 MG tablet Take 1 tablet (0.6 mg total) by mouth 3 (three) times daily as needed. For gout pain. NEED OV. Patient taking differently: Take 0.6-1.8 mg by mouth 3 (three) times daily as needed (for gout). For gout pain. NEED OV. 03/04/17  Yes Martinique, Peter M, MD  Cyanocobalamin (VITAMIN B-12 PO) Take 1 tablet by mouth daily.   Yes [provider]  famotidine (PEPCID) 20 MG tablet Take 20 mg by mouth every morning.    Yes [provider]  fluticasone (FLONASE) 50 MCG/ACT nasal spray Place 2 sprays into both nostrils daily as needed for allergies or rhinitis.   Yes [provider]  furosemide (LASIX) 40 MG tablet Take 1 tablet (40 mg total) by mouth 2 (two) times daily. Patient taking differently: Take 20 mg by mouth 2 (two) times daily.  08/09/13  Yes Martinique, Peter M, MD  gabapentin (NEURONTIN) 300 MG capsule Take 300 mg by mouth 2 (two) times daily.   Yes [provider]  lisinopril (PRINIVIL,ZESTRIL) 10 MG tablet Take 1 tablet (10 mg total) by mouth daily. 11/14/15  Yes Martinique, Peter M, MD  loratadine (CLARITIN) 10 MG tablet Take 10 mg by mouth daily as needed for allergies.   Yes [provider]  losartan (COZAAR) 100 MG tablet Take 100 mg by mouth daily.   Yes [provider]  metoCLOPramide (REGLAN) 5 MG tablet Take 1 tablet (5 mg total) by mouth 4 (four) times daily -  before meals and at bedtime. 12/30/14  Yes Leonie Man, MD  Multiple Vitamin (MULTIVITAMIN) tablet Take 1 tablet by mouth daily.     Yes [provider]  nitroGLYCERIN (NITROSTAT) 0.4 MG SL tablet Place 1 tablet (0.4 mg total) under the tongue every 5 (five) minutes as needed for chest pain (up to 3 doses). 02/20/15  Yes Martinique, Peter M, MD  pantoprazole (PROTONIX) 40 MG tablet Take 40 mg by mouth daily.   Yes [provider]  tamsulosin (FLOMAX) 0.4 MG CAPS capsule Take 0.4 mg by mouth daily.   Yes [provider]  fluticasone (FLOVENT DISKUS) 50 MCG/BLIST diskus inhaler Inhale 1 puff into the lungs 2 (two) times daily.    [provider]  insulin NPH (HUMULIN N,NOVOLIN N) 100 UNIT/ML injection Inject 15 Units into the skin 2 (two) times daily before a meal.    [provider]  insulin regular (NOVOLIN R,HUMULIN R) 100 units/mL injection Inject 10 Units into the skin 2 (two) times daily before a meal.    [provider]    Family History Family History  Problem Relation Age of Onset  . Coronary artery disease Father   . Heart attack Father   . Coronary artery disease Brother        1/2 brother   . Kidney disease Sister   . Cardiomyopathy Mother   . Asthma Brother     Social History Social History  Substance Use Topics  . Smoking status: Former Smoker    Packs/day: 1.50    Years: 35.00    Types: Cigarettes    Quit date: 10/12/1983  . Smokeless tobacco: Never Used  . Alcohol use 0.0 oz/week     Comment: 2-3 drinks 3-4x/wk.     Allergies   Patient has no known allergies.   Review of Systems Review of Systems  Constitutional: Positive for diaphoresis. Negative for chills and fever.  HENT: Negative for congestion and  facial swelling.   Eyes: Negative for discharge and visual disturbance.  Respiratory: Negative for shortness of breath.   Cardiovascular: Positive for chest pain. Negative for palpitations.  Gastrointestinal: Negative for abdominal pain, diarrhea and vomiting.  Musculoskeletal: Negative for arthralgias and myalgias.  Skin: Negative for color change and rash.  Neurological: Negative for tremors, syncope and headaches.  Psychiatric/Behavioral: Negative for confusion and dysphoric mood.     Physical Exam Updated Vital Signs BP (!) 152/65   Pulse (!) 56   Temp 97.6 F (36.4 C) (Oral)   Resp 19   SpO2 96%   Physical Exam  Constitutional: He is oriented to person, place, and time. He appears well-developed and well-nourished.  HENT:  Head: Normocephalic and atraumatic.  Eyes: Pupils are equal, round, and reactive to light. EOM are normal.  Neck: Normal range of motion. Neck supple. No JVD present.  Cardiovascular: Normal rate and regular rhythm.  Exam reveals no gallop and no friction rub.   No murmur heard. Pulmonary/Chest: No respiratory distress. He has no wheezes. He exhibits no tenderness.  Abdominal: He exhibits no distension and no mass. There is no tenderness. There is no rebound and no guarding.  Musculoskeletal: Normal range of motion.  Neurological: He is alert and oriented to person, place, and time.  Skin: No rash noted. No pallor.  Psychiatric: He has a normal mood and affect. His behavior is normal.  Nursing note and vitals reviewed.    ED Treatments / Results  Labs (all labs ordered are listed, but only abnormal results are displayed) Labs Reviewed  BASIC METABOLIC PANEL - Abnormal; Notable for the following:       Result Value   Glucose, Bld 188 (*)    BUN 28 (*)    Creatinine, Ser 1.46 (*)    GFR calc non Af Amer 43 (*)    GFR calc Af Amer 50 (*)    All other components within normal limits  CBC - Abnormal; Notable for the following:    Platelets  9 (*)     All other components within normal limits  I-STAT TROPONIN, ED    EKG  EKG Interpretation  Date/Time:  Saturday May 21 2017 08:01:17 EDT Ventricular Rate:  54 PR Interval:  314 QRS Duration: 112 QT Interval:  508 QTC Calculation: 481 R Axis:   74 Text Interpretation:  Sinus bradycardia with sinus arrhythmia with 1st degree A-V block Cannot rule out Anterior infarct , age undetermined ST & T wave abnormality, consider inferolateral ischemia Abnormal ECG No significant change since last tracing Confirmed by Deno Etienne [phone]) on 05/21/2017 9:03:01 AM       Radiology Dg Chest 2 View  Result Date: 05/21/2017 CLINICAL DATA:  Substernal chest pain EXAM: CHEST  2 VIEW COMPARISON:  05/12/2013 FINDINGS: Cardiac shadow is stable. Postsurgical changes are again seen. The lungs are well aerated bilaterally. No focal infiltrate or sizable effusion is seen. No acute bony abnormality is noted. IMPRESSION: No active cardiopulmonary disease. Electronically Signed   By: Inez Catalina M.D.   On: 05/21/2017 08:30    Procedures Procedures (including critical care time)  Medications Ordered in ED Medications  nitroGLYCERIN (NITROSTAT) SL tablet 0.4 mg (not administered)  aspirin chewable tablet 324 mg (not administered)     Initial Impression / Assessment and Plan / ED Course  I have reviewed the triage vital signs and the nursing notes.  Pertinent labs & imaging results that were available during my care of the patient were reviewed by me and considered in my medical decision making (see chart for details).     83 yo M With a chief complaint of chest pain. This feels somewhat like his prior chest pain event. His EKG and troponin are unremarkable. I discussed the case with cardiology who came down to see the patient.  They will admit.    The patients results and plan were reviewed and discussed.   Any x-rays performed were independently reviewed by myself.   Differential diagnosis were  considered with the presenting HPI.  Medications  nitroGLYCERIN (NITROSTAT) SL tablet 0.4 mg (not administered)  aspirin chewable tablet 324 mg (not administered)    Vitals:   05/21/17 0806 05/21/17 0812 05/21/17 1000  BP: (!) 155/69 (!) 158/71 (!) 152/65  Pulse: (!) 56    Resp: 18  19  Temp: 97.6 F (36.4 C)    TempSrc: Oral    SpO2: 96%      Final diagnoses:  Chest pain with high risk for cardiac etiology    Admission/ observation were discussed with the admitting physician, patient and/or family and they are comfortable with the plan.    Final Clinical Impressions(s) / ED Diagnoses   Final diagnoses:  Chest pain with high risk for cardiac etiology    New Prescriptions New Prescriptions   No medications on file     Deno Etienne, DO 05/21/17 1243

## 2017-05-21 NOTE — H&P (Signed)
History & Physical    Patient ID: Scott [name] MRN: [id number], DOB/AGE: [birth year]   Admit date: 05/21/2017   Primary Physician: Reubin Milan, MD Primary Cardiologist: P. Martinique, MD   Patient Profile    83 year old male with prior history of CAD status post CABG and multiple interventions to vein grafts, chronic combined systolic and diastolic heart failure, stage III chronic kidney disease, hypertension, hyperlipidemia, sleep apnea, and gout, who presented to the emergency department this morning after an episode of chest discomfort.  Past Medical History    Past Medical History:  Diagnosis Date  . Arthritis   . CAD (coronary artery disease)    a. 1997 CABGx3 (VG->RPDA, VG->PLV, LIMA->LAD;  b. 07/2004 PCI VG->PLV (3.5x16 Taxus DES, 3.5x12 Taxus DES); c. 11/2009 PCI: VG->PLV 3.5x12 Promus DES), VG->RPDA (3.5x15 Promus DES); d. 08/2012 Cath: patent grafts; e. 05/2013 Lexiscan MV: no ischemia/infarction, EF 44%.  . Cataract   . Chronic combined systolic and diastolic CHF (congestive heart failure) ([place]) 08/2012   a. EF 50-55% 11/2011; b. 08/2012 - EF 25-30%,  with mild RM, mod TR, mod RAE & LAE, moderate reduced RV systolic function and PASP of 61mmHg. - > cath showed patent grafts & Mod Puml HTN wth elevated PCWP;  c. 01/2014 Echo: EF 35-40%, mod LVH, mod HK, mildly dil LA, mod dil RA, PASP 53mmHg.  . CKD (chronic kidney disease), stage III   . Dyslipidemia   . GERD (gastroesophageal reflux disease)   . Gout   . Hyperkalemia   . Hypertension   . Insulin Dependent Diabetes mellitus   . Sleep apnea   . Thrombocytopenia ([place])   . UTI (lower urinary tract infection) 08/2012   E.coli    Past Surgical History:  Procedure Laterality Date  . APPENDECTOMY    . BACK SURGERY     15 years ago  . CARDIAC CATHETERIZATION  2005   stenting of the vein graft to the posterior lateral per -- Dr. Martinique      . CARDIAC CATHETERIZATION  2011   showed right coronary, totally occluded LAD and  severe stenosis in both saphenous vein graft to the posterior lateral and posterior descending  -- stenting of the proximal portion of the SVG to the posterior lateral branch in 2/11  . CHOLECYSTECTOMY    . CORONARY ARTERY BYPASS GRAFT  1997    (LIMA to LAD, SVG to PDA, SVG to PL)  . LEFT AND RIGHT HEART CATHETERIZATION WITH CORONARY/GRAFT ANGIOGRAM  09/04/2012   Procedure: LEFT AND RIGHT HEART CATHETERIZATION WITH Beatrix Fetters;  Surgeon: Sherren Mocha, MD;  Location: [facility] CATH LAB;  patent grafts, mod 2ndary pulmonary HTN, elevated LV EDP & PCWP  . PARTIAL COLECTOMY     cancerous polyps     Allergies  No Known Allergies  History of Present Illness    No weight 83 year old male with the above complex past medical history including coronary artery disease status post coronary artery bypass grafting in 1997 with subsequent interventions to the vein graft to the PLV in 2005 followed by drug-eluting stent placement to the vein graft to the PLV and also vein graft to the PDA in 2011.  He was last hospitalized for c/p in 2014 @ which time he underwent stress testing which was nonischemic. His other history includes chronic combined systolic and diastolic congestive heart failure and ischemic cardiomyopathy with an EF as low as 25-30% in November 2013. Last documented EF was 35-40% by echo in April 2015. Other history  includes hypertension, hyperlipidemia, stage III chronic kidney disease, insulin-dependent diabetes, gout, and GERD.  He was last seen in clinic in June 2017, at which time he was doing recently well. He continues to do well over the past year and is reasonably active though does not routinely exercise. He does have 2-3 drinks multiple nights per week. He does not smoke. He was in his usual state of health until about 5 PM on August 10, when he developed lower sternal and epigastric bandlike discomfort without associated symptoms. He took Maalox and Pepto-Bismol. When symptoms did  not resolve, he took a nitroglycerin and drove to the emergency department. Once he arrived in the parking lot, symptoms resolve completely. Total duration symptoms was about 40 minutes. Since symptoms resolved, he drove back home. He had no recurrent symptoms overnight when he awoke this morning at about 7 AM, he again noted epigastric and lower sternal discomfort. Again, he took Pepto-Bismol followed by nitroglycerin with relief in about 30 minutes. He called a friend and asked him to drive him to the emergency department. Here, ECG is nonacute and so far, troponin is normal. He is currently chest pain-free.  Home Medications    Prior to Admission medications   Medication Sig Start Date End Date Taking? Authorizing Provider  allopurinol (ZYLOPRIM) 100 MG tablet Take 2 tablets (200 mg total) by mouth daily. 09/05/12   Hope, Jessica A, PA-C  aspirin 81 MG EC tablet Take 81 mg by mouth daily.      [provider]  carvedilol (COREG) 12.5 MG tablet Take 1 tablet (12.5 mg total) by mouth 2 (two) times daily with a meal. 09/05/12   Hope, Jessica A, PA-C  colchicine 0.6 MG tablet Take 1 tablet (0.6 mg total) by mouth 3 (three) times daily as needed. For gout pain. NEED OV. 03/04/17   Martinique, Peter M, MD  Cyanocobalamin (VITAMIN B-12 PO) Take 1 tablet by mouth daily.    [provider]  famotidine (PEPCID) 20 MG tablet Take 20 mg by mouth at bedtime.    [provider]  furosemide (LASIX) 40 MG tablet Take 1 tablet (40 mg total) by mouth 2 (two) times daily. 08/09/13   Martinique, Peter M, MD  insulin NPH (HUMULIN N,NOVOLIN N) 100 UNIT/ML injection Inject 15 Units into the skin 2 (two) times daily before a meal.    [provider]  insulin regular (NOVOLIN R,HUMULIN R) 100 units/mL injection Inject 10 Units into the skin 2 (two) times daily before a meal.    [provider]  lisinopril (PRINIVIL,ZESTRIL) 10 MG tablet Take 1 tablet (10 mg total) by mouth daily. 11/14/15    Martinique, Peter M, MD  metoCLOPramide (REGLAN) 5 MG tablet Take 1 tablet (5 mg total) by mouth 4 (four) times daily -  before meals and at bedtime. 12/30/14   Leonie Man, MD  Multiple Vitamin (MULTIVITAMIN) tablet Take 1 tablet by mouth daily.      [provider]  nitroGLYCERIN (NITROSTAT) 0.4 MG SL tablet Place 1 tablet (0.4 mg total) under the tongue every 5 (five) minutes as needed for chest pain (up to 3 doses). 02/20/15   Martinique, Peter M, MD    Family History    Family History  Problem Relation Age of Onset  . Coronary artery disease Father   . Heart attack Father   . Coronary artery disease Brother        1/2 brother   . Kidney disease Sister   .  Cardiomyopathy Mother   . Asthma Brother     Social History    Social History   Social History  . Marital status: Single    Spouse name: N/A  . Number of children: 0  . Years of education: N/A   Occupational History  . retired   ran a Office manager course    Social History Main Topics  . Smoking status: Former Smoker    Packs/day: 1.50    Years: 35.00    Types: Cigarettes    Quit date: 10/12/1983  . Smokeless tobacco: Never Used  . Alcohol use 0.0 oz/week     Comment: 2-3 drinks 3-4x/wk.  . Drug use: No  . Sexual activity: Not Currently    Birth control/ protection: None   Other Topics Concern  . Not on file   Social History Narrative   Lives in [place] by himself.  Active but does not routinely exercise.     Review of Systems    General:  No chills, fever, night sweats or weight changes.  Cardiovascular:  Positive chest pain, no dyspnea on exertion, edema, orthopnea, palpitations, paroxysmal nocturnal dyspnea. Dermatological: No rash, lesions/masses Respiratory: No cough, dyspnea Urologic: No hematuria, dysuria Abdominal:   No nausea, vomiting, diarrhea, bright red blood per rectum, melena, or hematemesis Neurologic:  No visual changes, wkns, changes in mental status. All other systems reviewed and are  otherwise negative except as noted above.  Physical Exam    Blood pressure (!) 152/65, pulse (!) 56, temperature 97.6 F (36.4 C), temperature source Oral, resp. rate 19, SpO2 96 %.  General: Pleasant, NAD Psych: Normal affect. Neuro: Alert and oriented X 3. Moves all extremities spontaneously. HEENT: Normal  Neck: Supple without bruits or JVD. Lungs:  Resp regular and unlabored, CTA. Heart: RRR no s3, s4, or murmurs. Abdomen: Soft, non-tender, non-distended, BS + x 4.  Extremities: No clubbing, cyanosis or edema. DP/PT/Radials 2+ and equal bilaterally.  Labs    Troponin [facility] of Care Test)  Recent Labs  05/21/17 0822  TROPIPOC 0.02    Lab Results  Component Value Date   WBC 7.0 05/21/2017   HGB 13.8 05/21/2017   HCT 43.6 05/21/2017   MCV 93.4 05/21/2017   PLT 73 (L) 05/21/2017    Recent Labs Lab 05/21/17 0810  NA 137  K 4.6  CL 103  CO2 25  BUN 28*  CREATININE 1.46*  CALCIUM 9.2  GLUCOSE 188*   Lab Results  Component Value Date   CHOL 118 08/06/2013   HDL 47.60 08/06/2013   LDLCALC 23 09/01/2012   TRIG 237.0 (H) 08/06/2013      Radiology Studies    Dg Chest 2 View  Result Date: 05/21/2017 CLINICAL DATA:  Substernal chest pain EXAM: CHEST  2 VIEW COMPARISON:  05/12/2013 FINDINGS: Cardiac shadow is stable. Postsurgical changes are again seen. The lungs are well aerated bilaterally. No focal infiltrate or sizable effusion is seen. No acute bony abnormality is noted. IMPRESSION: No active cardiopulmonary disease. Electronically Signed   By: Inez Catalina M.D.   On: 05/21/2017 08:30    ECG & Cardiac Imaging    Sinus bradycardia, 54, first AV block, incomplete left bundle branch block, inferolateral ST depression which is similar to prior ECGs.  Assessment & Plan    1. Precordial chest and epigastric discomfort/coronary artery disease: Patient presented to the emergency department this morning after 2 episodes of lower sternal and epigastric discomfort  lasting between 30-40 minutes and resolving after antacid preparations  and nitrates. ECG is nonacute and initial troponin is normal. He is currently chest pain-free. He does have a significant history of coronary artery disease with remote coronary artery bypass grafting and subsequent stenting of vein grafts to the PL and PDA. Last catheterization was in 2013 while last stress test was in 2014, and was nonischemic. Plan to admit cycle cardiac enzymes. Provided the troponins remained normal, we would likely pursue an inpatient Myoview in the morning. Continue aspirin, beta blocker, and ACE inhibitor. He is not currently on a statin and he says he isn't sure why.  Will look into it an plan to add.  2.  Essential HTN:  BP elevated in ED.  Resume home meds and adjust as necessary.  3.  HL:  Not on statin.  LDL was 23 in 08/2012.  4.  HFrEF/ICM:  euvolemic on exam.  Cont  blocker, acei, and lasix.  Not on MRA 2/2 h/o hyperkalemia  5.  IDDM: cont home meds.   6.  CKD III:  Creat appears to be @ baseline.  Follow.  7.  Thrombocytopenia:  Stable.  Will avoid heparin for now.  Signed, Murray Hodgkins, NP 05/21/2017, 10:55 AM  I have seen and examined this patient with Ignacia Bayley.  Agree with above, note added to reflect my findings.  On exam, iRRR, no murmurs, lungs clear. Had chest pain over the last 2 days with some relief with NTG. Troponin negative x1. Plan for rule out and if negative, will plan for stress myoview. ECG without acute changes. There is a myoview in the past to compare.     Will M. Camnitz MD 05/21/2017 12:11 PM

## 2017-05-22 ENCOUNTER — Observation Stay (HOSPITAL_COMMUNITY): Payer: Medicare HMO

## 2017-05-22 ENCOUNTER — Encounter (HOSPITAL_COMMUNITY): Payer: Self-pay | Admitting: Nurse Practitioner

## 2017-05-22 ENCOUNTER — Other Ambulatory Visit: Payer: Self-pay

## 2017-05-22 DIAGNOSIS — I2510 Atherosclerotic heart disease of native coronary artery without angina pectoris: Secondary | ICD-10-CM | POA: Diagnosis not present

## 2017-05-22 DIAGNOSIS — I200 Unstable angina: Secondary | ICD-10-CM | POA: Diagnosis not present

## 2017-05-22 DIAGNOSIS — E785 Hyperlipidemia, unspecified: Secondary | ICD-10-CM | POA: Diagnosis not present

## 2017-05-22 DIAGNOSIS — I129 Hypertensive chronic kidney disease with stage 1 through stage 4 chronic kidney disease, or unspecified chronic kidney disease: Secondary | ICD-10-CM | POA: Diagnosis not present

## 2017-05-22 DIAGNOSIS — E119 Type 2 diabetes mellitus without complications: Secondary | ICD-10-CM | POA: Diagnosis not present

## 2017-05-22 DIAGNOSIS — I5042 Chronic combined systolic (congestive) and diastolic (congestive) heart failure: Secondary | ICD-10-CM | POA: Diagnosis not present

## 2017-05-22 DIAGNOSIS — N183 Chronic kidney disease, stage 3 (moderate): Secondary | ICD-10-CM | POA: Diagnosis not present

## 2017-05-22 DIAGNOSIS — R079 Chest pain, unspecified: Secondary | ICD-10-CM | POA: Diagnosis not present

## 2017-05-22 DIAGNOSIS — Z87891 Personal history of nicotine dependence: Secondary | ICD-10-CM | POA: Diagnosis not present

## 2017-05-22 DIAGNOSIS — I110 Hypertensive heart disease with heart failure: Secondary | ICD-10-CM | POA: Diagnosis not present

## 2017-05-22 LAB — NM MYOCAR MULTI W/SPECT W/WALL MOTION / EF
Estimated workload: 1 METS
Exercise duration (min): 0 min
Exercise duration (sec): 0 s
MPHR: 138 {beats}/min
Peak HR: 138 {beats}/min
Percent HR: 47 %
Rest HR: 53 {beats}/min

## 2017-05-22 LAB — COMPREHENSIVE METABOLIC PANEL
ALT: 46 U/L (ref 17–63)
AST: 42 U/L — ABNORMAL HIGH (ref 15–41)
Albumin: 3.5 g/dL (ref 3.5–5.0)
Alkaline Phosphatase: 87 U/L (ref 38–126)
Anion gap: 7 (ref 5–15)
BUN: 30 mg/dL — ABNORMAL HIGH (ref 6–20)
CO2: 27 mmol/L (ref 22–32)
Calcium: 8.9 mg/dL (ref 8.9–10.3)
Chloride: 103 mmol/L (ref 101–111)
Creatinine, Ser: 1.58 mg/dL — ABNORMAL HIGH (ref 0.61–1.24)
GFR calc Af Amer: 45 mL/min — ABNORMAL LOW (ref >60)
GFR calc non Af Amer: 39 mL/min — ABNORMAL LOW (ref >60)
Glucose, Bld: 152 mg/dL — ABNORMAL HIGH (ref 65–99)
Potassium: 4.6 mmol/L (ref 3.5–5.1)
Sodium: 137 mmol/L (ref 135–145)
Total Bilirubin: 0.9 mg/dL (ref 0.3–1.2)
Total Protein: 6.7 g/dL (ref 6.5–8.1)

## 2017-05-22 LAB — GLUCOSE, CAPILLARY
Glucose-Capillary: 171 mg/dL — ABNORMAL HIGH (ref 65–99)
Glucose-Capillary: 194 mg/dL — ABNORMAL HIGH (ref 65–99)

## 2017-05-22 LAB — LIPID PANEL
Cholesterol: 107 mg/dL (ref 0–200)
HDL: 36 mg/dL — ABNORMAL LOW (ref >40)
LDL Cholesterol: 29 mg/dL (ref 0–99)
Total CHOL/HDL Ratio: 3 RATIO
Triglycerides: 212 mg/dL — ABNORMAL HIGH (ref <150)
VLDL: 42 mg/dL — ABNORMAL HIGH (ref 0–40)

## 2017-05-22 LAB — TROPONIN I: Troponin I: 0.03 ng/mL (ref <0.03)

## 2017-05-22 MED ORDER — REGADENOSON 0.4 MG/5ML IV SOLN
INTRAVENOUS | Status: AC
  Filled 2017-05-22: qty 5

## 2017-05-22 MED ORDER — ISOSORBIDE MONONITRATE ER 30 MG PO TB24: 30.0000 mg | ORAL_TABLET | Freq: Every day | ORAL | 6 refills | Status: DC

## 2017-05-22 MED ORDER — TECHNETIUM TC 99M TETROFOSMIN IV KIT
10.0000 | PACK | Freq: Once | INTRAVENOUS | Status: AC | PRN
  Administered 2017-05-22: 10 via INTRAVENOUS

## 2017-05-22 MED ORDER — TECHNETIUM TC 99M TETROFOSMIN IV KIT
30.0000 | PACK | Freq: Once | INTRAVENOUS | Status: AC | PRN
  Administered 2017-05-22: 30 via INTRAVENOUS

## 2017-05-22 NOTE — Discharge Summary (Signed)
Discharge Summary    Patient ID: Scott [name],  MRN: [id number], DOB/AGE: 1933/07/02 83 y.o.  Admit date: 05/21/2017 Discharge date: 05/22/2017  Primary Care Provider: Reubin [name] Primary Cardiologist: P. Martinique, MD   Discharge Diagnoses    Principal Problem:   Unstable angina (Scott [name])  **Low risk stress test this admission.  Active Problems:   Arteriosclerotic coronary artery disease   Hypertension   Ischemic cardiomyopathy   Diabetes mellitus, type II, insulin dependent (HCC)   CKD (chronic kidney disease) stage 3, GFR 30-59 ml/min   Chronic combined systolic and diastolic heart failure (HCC)   Dyslipidemia   OSA (obstructive sleep apnea)   Allergies No Known Allergies  Diagnostic Studies/Procedures    Lexiscan Cardiolite 8.12.2018  Low-risk study per Dr. Meda [name].  Final report not yet available. _____________   History of Present Illness     83 y/o ? with a h/o CAD s/p CABG x 3 in 1997 with subsequent PCI within the [facility]  PLV in 2005, followed by DES to both the [facility]  PLV and [facility]  PDA in 2011.  He had a non-ischemic myoview in 2014.  Other history included combined CHF with an EF of 35-40%, HTN, HL, stage III CKD, IDDM, gout, and GERD.  He was in his usual state of health until the evening prior to admission, when he had a 30-40 minute episode of lower sternal and epigastric band-like discomfort.  This eventually resolved after taking Scott [name], Scott [name], and sl ntg.  Symptoms recurred on the morning of admission, again resolving after approx 30 mins after taking Scott [name] and sl ntg.  Following the second episode, he decided to present to the ED for further evaluation.  There, ECG was non-acute and troponin was normal.  He was admitted for further evaluation.  Hospital Course     Consultants: None   Pt had no further chest pain following admission.  His troponin rose minimally to 0.04 with a flat trend, which was felt to be non-specific.  He underwent a lexiscan  myoview this morning, which showed a small area of questionable ischemia vs attenuation artifact, but was overall felt to be low-risk.  As [name] has been ambulating without recurrent symptoms or limitations, he will be discharged home today in good condition.  We did add long-acting nitrate therapy to his current regimen and have also arranged for early outpatient follow-up.  If he develops recurrent symptoms, he will likely require diagnostic catheterization. _____________  Discharge Vitals Blood pressure (!) 150/75, pulse (!) 59, temperature 98.5 F (36.9 C), temperature source Oral, resp. rate 18, height 6' (1.829 m), weight 216 lb 1.6 oz (98 kg), SpO2 94 %.  Filed Weights   05/21/17 1554 05/22/17 0536  Weight: 219 lb 9.3 oz (99.6 kg) 216 lb 1.6 oz (98 kg)    Labs & Radiologic Studies    CBC  Recent Labs  05/21/17 0810  WBC 7.0  HGB 13.8  HCT 43.6  MCV 93.4  PLT 73*   Basic Metabolic Panel  Recent Labs  05/21/17 0810 05/22/17 0359  NA 137 137  K 4.6 4.6  CL 103 103  CO2 25 27  GLUCOSE 188* 152*  BUN 28* 30*  CREATININE 1.46* 1.58*  CALCIUM 9.2 8.9   Liver Function Tests  Recent Labs  05/22/17 0359  AST 42*  ALT 46  ALKPHOS 87  BILITOT 0.9  PROT 6.7  ALBUMIN 3.5   Cardiac Enzymes  Recent Labs  05/21/17  1610 05/21/17 2125 05/22/17 0359  TROPONINI <0.03 0.04* 0.03*   Fasting Lipid Panel  Recent Labs  05/22/17 0359  CHOL 107  HDL 36*  LDLCALC 29  TRIG 212*  CHOLHDL 3.0   _____________  Dg Chest 2 View  Result Date: 05/21/2017 CLINICAL DATA:  Substernal chest pain EXAM: CHEST  2 VIEW COMPARISON:  05/12/2013 FINDINGS: Cardiac shadow is stable. Postsurgical changes are again seen. The lungs are well aerated bilaterally. No focal infiltrate or sizable effusion is seen. No acute bony abnormality is noted. IMPRESSION: No active cardiopulmonary disease. Electronically Signed   By: Scott [name] M.D.   On: 05/21/2017 08:30   Disposition   Pt is  being discharged home today in good condition.  Follow-up Plans & Appointments    Follow-up Information    Scott [name], [place] Follow up on 06/09/2017.   Specialties:  Cardiology, Radiology Why:  9:30 AM Contact information: [address] [place] [address] [phone]        Scott Milan, MD. Schedule an appointment as soon as possible for a visit in 2 week(s).   Specialty:  Internal Medicine Contact information: [place] [address] [phone]            Discharge Medications   Current Discharge Medication List    START taking these medications   Details  isosorbide mononitrate (IMDUR) 30 MG 24 hr tablet Take 1 tablet (30 mg total) by mouth daily. Qty: 30 tablet, Refills: 6      CONTINUE these medications which have NOT CHANGED   Details  allopurinol (ZYLOPRIM) 100 MG tablet Take 2 tablets (200 mg total) by mouth daily. Qty: 60 tablet, Refills: 1    aspirin 81 MG EC tablet Take 81 mg by mouth daily.      atorvastatin (LIPITOR) 80 MG tablet Take 80 mg by mouth every morning.    carvedilol (COREG) 12.5 MG tablet Take 1 tablet (12.5 mg total) by mouth 2 (two) times daily with a meal. Qty: 60 tablet, Refills: 6    colchicine 0.6 MG tablet Take 1 tablet (0.6 mg total) by mouth 3 (three) times daily as needed. For gout pain. NEED OV. Qty: 60 tablet, Refills: 6    Cyanocobalamin (VITAMIN B-12 PO) Take 1 tablet by mouth daily.    famotidine (PEPCID) 20 MG tablet Take 20 mg by mouth every morning.     fluticasone (FLONASE) 50 MCG/ACT nasal spray Place 2 sprays into both nostrils daily as needed for allergies or rhinitis.    furosemide (LASIX) 40 MG tablet Take 1 tablet (40 mg total) by mouth 2 (two) times daily. Qty: 60 tablet, Refills: 6    gabapentin (NEURONTIN) 300 MG capsule Take 300 mg by mouth 2 (two) times daily.    insulin aspart protamine- aspart (NOVOLOG MIX 70/30) (70-30) 100 UNIT/ML injection Inject 35 Units  into the skin See admin instructions. Inject 35 units underskin two time daily before breakfast and dinner.    loratadine (CLARITIN) 10 MG tablet Take 10 mg by mouth daily as needed for allergies.    losartan (COZAAR) 100 MG tablet Take 100 mg by mouth daily.    metoCLOPramide (REGLAN) 5 MG tablet Take 1 tablet (5 mg total) by mouth 4 (four) times daily -  before meals and at bedtime. Qty: 28 tablet, Refills: 0   Associated Diagnoses: SOB (shortness of breath)    Multiple Vitamin (MULTIVITAMIN) tablet Take 1 tablet by mouth daily.  nitroGLYCERIN (NITROSTAT) 0.4 MG SL tablet Place 1 tablet (0.4 mg total) under the tongue every 5 (five) minutes as needed for chest pain (up to 3 doses). Qty: 25 tablet, Refills: 3    pantoprazole (PROTONIX) 40 MG tablet Take 40 mg by mouth daily.    PRESCRIPTION MEDICATION Place 1-2 drops into both eyes daily.    tamsulosin (FLOMAX) 0.4 MG CAPS capsule Take 0.4 mg by mouth daily.      STOP taking these medications     lisinopril (PRINIVIL,ZESTRIL) 10 MG tablet      insulin NPH (HUMULIN N,NOVOLIN N) 100 UNIT/ML injection           Outstanding Labs/Studies   None  Duration of Discharge Encounter   Greater than 30 minutes including physician time.  Signed, Murray Hodgkins NP 05/22/2017, 2:35 PM   I have seen and examined this patient with Ignacia Bayley.  Agree with above, note added to reflect my findings.  On exam, RRR, no murmurs, lungs clear. Presented to the hospital with chest pain thought due to unstable angina. Nuclear stress test was deemed low risk. Long acting nitrate was started and the patient was discharged home in stable condition.    Will M. Camnitz MD 05/23/2017 6:14 AM

## 2017-05-22 NOTE — Discharge Instructions (Signed)
**  PLEASE REMEMBER TO BRING ALL OF YOUR MEDICATIONS TO EACH OF YOUR FOLLOW-UP OFFICE VISITS. ° °  ° °10 Habits of Highly Healthy People ° ° wants to help you get well and stay well.  Live a longer, healthier life by practicing healthy habits every day. ° °1.  Visit your primary care provider regularly. °2.  Make time for family and friends.  Healthy relationships are important. °3.  Take medications as directed by your provider. °4.  Maintain a healthy weight and a trim waistline. °5.  Eat healthy meals and snacks, rich in fruits, vegetables, whole grains, and lean proteins. °6.  Get moving every day - aim for 150 minutes of moderate physical activity each week. °7.  Don't smoke. °8.  Avoid alcohol or drink in moderation. °9.  Manage stress through meditation or mindful relaxation. °10.  Get seven to nine hours of quality sleep each night. ° °Want more information on healthy habits?  To learn more about these and other healthy habits, visit [email or web address]. °_____________ °  ° ° °

## 2017-05-22 NOTE — Progress Notes (Signed)
Progress Note  Patient Name: Scott [name] Date of Encounter: 05/22/2017  Primary Cardiologist: Martinique  Subjective   Feeling well. No current chest pain.  Inpatient Medications    Scheduled Meds: . allopurinol  200 mg Oral Daily  . aspirin EC  81 mg Oral Daily  . atorvastatin  40 mg Oral q1800  . carvedilol  12.5 mg Oral BID WC  . famotidine  20 mg Oral QHS  . furosemide  40 mg Oral BID  . insulin aspart  0-15 Units Subcutaneous TID WC  . insulin NPH Human  15 Units Subcutaneous BID AC  . lisinopril  10 mg Oral Daily  . metoCLOPramide  5 mg Oral TID AC & HS  . regadenoson  0.4 mg Intravenous Once   Continuous Infusions:  PRN Meds: acetaminophen, nitroGLYCERIN, ondansetron (ZOFRAN) IV   Vital Signs    Vitals:   05/21/17 1554 05/21/17 1703 05/21/17 2122 05/22/17 0536  BP: (!) 181/73 (!) 162/68  (!) 138/57  Pulse:  61 (!) 59 (!) 59  Resp: 16 20 18 18   Temp: 99.6 F (37.6 C) 98 F (36.7 C) 98.2 F (36.8 C) 98.5 F (36.9 C)  TempSrc: Oral Oral Oral Oral  SpO2: 96% 100% 100% 94%  Weight: 219 lb 9.3 oz (99.6 kg)   216 lb 1.6 oz (98 kg)  Height: 6' (1.829 m)       Intake/Output Summary (Last 24 hours) at 05/22/17 0739 Last data filed at 05/22/17 0546  Gross per 24 hour  Intake              480 ml  Output             1500 ml  Net            -1020 ml   Filed Weights   05/21/17 1554 05/22/17 0536  Weight: 219 lb 9.3 oz (99.6 kg) 216 lb 1.6 oz (98 kg)    Telemetry    Sinus bradycardia - Personally Reviewed  ECG    Sinus rhythm, first-degree AV block - Personally Reviewed  Physical Exam   GEN: No acute distress.   Neck: No JVD Cardiac: RRR, no murmurs, rubs, or gallops.  Respiratory: Clear to auscultation bilaterally. GI: Soft, nontender, non-distended  MS: No edema; No deformity. Neuro:  Nonfocal  Psych: Normal affect   Labs    Chemistry Recent Labs Lab 05/21/17 0810 05/22/17 0359  NA 137 137  K 4.6 4.6  CL 103 103  CO2 25 27  GLUCOSE  188* 152*  BUN 28* 30*  CREATININE 1.46* 1.58*  CALCIUM 9.2 8.9  PROT  --  6.7  ALBUMIN  --  3.5  AST  --  42*  ALT  --  46  ALKPHOS  --  87  BILITOT  --  0.9  GFRNONAA 43* 39*  GFRAA 50* 45*  ANIONGAP 9 7     Hematology Recent Labs Lab 05/21/17 0810  WBC 7.0  RBC 4.67  HGB 13.8  HCT 43.6  MCV 93.4  MCH 29.6  MCHC 31.7  RDW 15.0  PLT 73*    Cardiac Enzymes Recent Labs Lab 05/21/17 1610 05/21/17 2125 05/22/17 0359  TROPONINI <0.03 0.04* 0.03*    Recent Labs Lab 05/21/17 0822  TROPIPOC 0.02     BNPNo results for input(s): BNP, PROBNP in the last 168 hours.   DDimer No results for input(s): DDIMER in the last 168 hours.   Radiology    Dg Chest  2 View  Result Date: 05/21/2017 CLINICAL DATA:  Substernal chest pain EXAM: CHEST  2 VIEW COMPARISON:  05/12/2013 FINDINGS: Cardiac shadow is stable. Postsurgical changes are again seen. The lungs are well aerated bilaterally. No focal infiltrate or sizable effusion is seen. No acute bony abnormality is noted. IMPRESSION: No active cardiopulmonary disease. Electronically Signed   By: Inez Catalina M.D.   On: 05/21/2017 08:30    Cardiac Studies  TTE 2015 - Left ventricle: The cavity size was moderately dilated. Wall thickness was increased in a pattern of moderate LVH. There was focal basal hypertrophy. Systolic function was moderately reduced. The estimated ejection fraction was in the range of 35% to 40%. Moderate hypokinesis. - Mitral valve: Mild regurgitation. - Left atrium: The atrium was mildly dilated. - Right atrium: The atrium was mildly to moderately dilated. - Pulmonary arteries: Systolic pressure was moderately increased. PA peak pressure: 73mm Hg (S).  Patient Profile     83 y.o. male prior history of CAD status post CABG and multiple interventions to vein grafts, chronic combined systolic and diastolic heart failure, stage III chronic kidney disease, hypertension, hyperlipidemia, sleep  apnea, and gout presented with chest pain.  Assessment & Plan    1. Unstable angina: patient's chest pain has improved. He has not had pain since being in the hospital. Troponins are mildly elevated at 0.04 but has since trended down. We'll plan for stress testing to further determine if he has any evidence of obstructive coronary disease. He may benefit from long-acting nitrates at discharge.  2.  Essential HTN:   blood pressure well-controlled today  3.  HL:  Not on statin.   LDL 29 this admission  4.  HFrEF/ICM:   euvolemic, no changes.  5.  IDDM: Continue home medications   6.  CKD III:   creatinine at baseline  7.  Thrombocytopenia:   stable  Signed, Hayley Horn Meredith Leeds, MD  05/22/2017, 7:39 AM

## 2017-06-09 ENCOUNTER — Encounter: Payer: Self-pay | Admitting: Physician Assistant

## 2017-06-09 ENCOUNTER — Ambulatory Visit (INDEPENDENT_AMBULATORY_CARE_PROVIDER_SITE_OTHER): Payer: Medicare HMO | Admitting: Physician Assistant

## 2017-06-09 VITALS — BP 128/58 | HR 50 | Ht 72.0 in | Wt 218.0 lb

## 2017-06-09 DIAGNOSIS — G4733 Obstructive sleep apnea (adult) (pediatric): Secondary | ICD-10-CM | POA: Diagnosis not present

## 2017-06-09 DIAGNOSIS — E785 Hyperlipidemia, unspecified: Secondary | ICD-10-CM | POA: Diagnosis not present

## 2017-06-09 DIAGNOSIS — IMO0001 Reserved for inherently not codable concepts without codable children: Secondary | ICD-10-CM

## 2017-06-09 DIAGNOSIS — Z794 Long term (current) use of insulin: Secondary | ICD-10-CM

## 2017-06-09 DIAGNOSIS — N183 Chronic kidney disease, stage 3 unspecified: Secondary | ICD-10-CM

## 2017-06-09 DIAGNOSIS — R072 Precordial pain: Secondary | ICD-10-CM

## 2017-06-09 DIAGNOSIS — I25700 Atherosclerosis of coronary artery bypass graft(s), unspecified, with unstable angina pectoris: Secondary | ICD-10-CM

## 2017-06-09 DIAGNOSIS — Z9989 Dependence on other enabling machines and devices: Secondary | ICD-10-CM

## 2017-06-09 DIAGNOSIS — I5042 Chronic combined systolic (congestive) and diastolic (congestive) heart failure: Secondary | ICD-10-CM

## 2017-06-09 DIAGNOSIS — E119 Type 2 diabetes mellitus without complications: Secondary | ICD-10-CM

## 2017-06-09 DIAGNOSIS — I10 Essential (primary) hypertension: Secondary | ICD-10-CM

## 2017-06-09 LAB — BASIC METABOLIC PANEL
BUN/Creatinine Ratio: 23 (ref 10–24)
BUN: 42 mg/dL — ABNORMAL HIGH (ref 8–27)
CO2: 27 mmol/L (ref 20–29)
Calcium: 9.9 mg/dL (ref 8.6–10.2)
Chloride: 97 mmol/L (ref 96–106)
Creatinine, Ser: 1.8 mg/dL — ABNORMAL HIGH (ref 0.76–1.27)
GFR calc Af Amer: 40 mL/min/{1.73_m2} — ABNORMAL LOW (ref >59)
GFR calc non Af Amer: 34 mL/min/{1.73_m2} — ABNORMAL LOW (ref >59)
Glucose: 217 mg/dL — ABNORMAL HIGH (ref 65–99)
Potassium: 5.7 mmol/L — ABNORMAL HIGH (ref 3.5–5.2)
Sodium: 139 mmol/L (ref 134–144)

## 2017-06-09 LAB — CBC
Hematocrit: 43.6 % (ref 37.5–51.0)
Hemoglobin: 14.3 g/dL (ref 13.0–17.7)
MCH: 30.4 pg (ref 26.6–33.0)
MCHC: 32.8 g/dL (ref 31.5–35.7)
MCV: 93 fL (ref 79–97)
Platelets: 86 10*3/uL — CL (ref 150–379)
RBC: 4.71 x10E6/uL (ref 4.14–5.80)
RDW: 14.3 % (ref 12.3–15.4)
WBC: 6.9 10*3/uL (ref 3.4–10.8)

## 2017-06-09 LAB — PROTIME-INR
INR: 1.1 (ref 0.8–1.2)
Prothrombin Time: 11 s (ref 9.1–12.0)

## 2017-06-09 MED ORDER — PANTOPRAZOLE SODIUM 40 MG PO TBEC: 40.0000 mg | DELAYED_RELEASE_TABLET | Freq: Two times a day (BID) | ORAL | 3 refills | Status: DC

## 2017-06-09 NOTE — Progress Notes (Signed)
Cardiology Office Note    Date:  06/11/2017   ID:  Scott [name], DOB 08-13-1933, MRN [id number]  PCP:  Reubin Milan, MD  Cardiologist:  Dr. Martinique  Chief Complaint  Patient presents with  . Follow-up    post hospital     History of Present Illness:  Scott [name] is a 83 y.o. male with PMH of CAD s/p CABG (LIMA to LAD, SVG to PLA and SVG to PDA), HTN, HLD, chronic combined systolic and diastolic HF, IDDM, stage III CKD, gout, UTI, OSA on CPAP and dietary noncompliance. EF was 25-30% on echocardiogram in December 2013. Last cardiac catheterization was on 09/04/2012 which showed diffuse 50% mid left main stenosis into 75% distal left main, diffuse 50% left circumflex stenosis, mid RCA was totally occluded, widely patent SVG to PDA, patent SVG to PLA with mild in-stent restenosis in the proximal aspect of the graft, patent LIMA to LAD. Last echocardiogram obtained on 01/10/2014 showed EF has improved to 35-40%, moderate LVH, mild MR, PA peak pressure 50 mmHg. He was most recently admitted with chest pain on 05/21/2017. Serial troponin was borderline. He eventually underwent Myoview on 05/22/2017 which showed a small area of questionable ischemia versus attenuation artifact but overall felt to be low risk. He was able to ambulate without recurrent symptom limitation. Therefore he was discharged on the same day on 30 mg Imdur, lisinopril was discontinued.  Patient presents today for cardiology office visit. He describes this previous angina as a pain across the entire upper chest. However the recent symptom he went to the hospital for was a lower chest/upper abdomen pain. Since his discharge, he continued to have almost hourly episode of discomfort. It is occurring both when he is laying down and when he is sitting up. I will increase his Protonix to 40 mg twice a day. Interestingly, he was able to ride his bicycle for 10 minutes and swim for 10 minutes yesterday at [facility] without any discomfort. He is  planning to see a GI doctor. However he also mentions, there is no pain on palpation, food does not affect the degree of chest pain. The pain is also not affected by deep palpation, body rotation or deep inspiration. This morning, he had a prolonged chest pain, EKG does not show significant changes. I had a discussed the case with DOD Dr. Percival Spanish who agree that given worsening frequency of symptom, despite there are typical and atypical features, we need t to make sure there is no new blockages. We recommended cardiac catheterization, risk and benefit has been discussed with the patient will also agree to proceed.   Past Medical History:  Diagnosis Date  . Arthritis   . CAD (coronary artery disease)    a. 1997 CABGx3 (VG->RPDA, VG->PLV, LIMA->LAD);  b. 07/2004 PCI VG->PLV (3.5x16 Taxus DES, 3.5x12 Taxus DES); c. 11/2009 PCI: VG->PLV 3.5x12 Promus DES), VG->RPDA (3.5x15 Promus DES); d. 08/2012 Cath: patent grafts; e. 05/2013 Lexiscan MV: no ischemia/infarction, EF 44%.  . Cataract   . Chronic combined systolic and diastolic CHF (congestive heart failure) ([place]) 08/2012   a. EF 50-55% 11/2011; b. 08/2012 - EF 25-30%,  with mild RM, mod TR, mod RAE & LAE, moderate reduced RV systolic function and PASP of 91mmHg. - > cath showed patent grafts & Mod Puml HTN wth elevated PCWP;  c. 01/2014 Echo: EF 35-40%, mod LVH, mod HK, mildly dil LA, mod dil RA, PASP 4mmHg.  . CKD (chronic kidney disease), stage III   .  Dyslipidemia   . GERD (gastroesophageal reflux disease)   . Gout   . Hyperkalemia   . Hypertension   . Insulin Dependent Diabetes mellitus   . Sleep apnea   . Thrombocytopenia ([place])   . UTI (lower urinary tract infection) 08/2012   E.coli    Past Surgical History:  Procedure Laterality Date  . APPENDECTOMY    . BACK SURGERY     15 years ago  . CARDIAC CATHETERIZATION  2005   stenting of the vein graft to the posterior lateral per -- Dr. Martinique      . CARDIAC CATHETERIZATION  2011   showed  right coronary, totally occluded LAD and severe stenosis in both saphenous vein graft to the posterior lateral and posterior descending  -- stenting of the proximal portion of the SVG to the posterior lateral branch in 2/11  . CHOLECYSTECTOMY    . CORONARY ARTERY BYPASS GRAFT  1997    (LIMA to LAD, SVG to PDA, SVG to PL)  . LEFT AND RIGHT HEART CATHETERIZATION WITH CORONARY/GRAFT ANGIOGRAM  09/04/2012   Procedure: LEFT AND RIGHT HEART CATHETERIZATION WITH Beatrix Fetters;  Surgeon: Sherren Mocha, MD;  Location: [facility] CATH LAB;  patent grafts, mod 2ndary pulmonary HTN, elevated LV EDP & PCWP  . PARTIAL COLECTOMY     cancerous polyps    Current Medications: Outpatient Medications Prior to Visit  Medication Sig Dispense Refill  . allopurinol (ZYLOPRIM) 100 MG tablet Take 2 tablets (200 mg total) by mouth daily. 60 tablet 1  . aspirin 81 MG EC tablet Take 81 mg by mouth daily.      Marland Kitchen atorvastatin (LIPITOR) 80 MG tablet Take 80 mg by mouth every morning.    . carvedilol (COREG) 12.5 MG tablet Take 1 tablet (12.5 mg total) by mouth 2 (two) times daily with a meal. 60 tablet 6  . colchicine 0.6 MG tablet Take 1 tablet (0.6 mg total) by mouth 3 (three) times daily as needed. For gout pain. NEED OV. (Patient taking differently: Take 0.6-1.8 mg by mouth 3 (three) times daily as needed (for gout pain.). For gout pain. NEED OV.) 60 tablet 6  . Cyanocobalamin (VITAMIN B-12 PO) Take 1 tablet by mouth daily.    . famotidine (PEPCID) 20 MG tablet Take 20 mg by mouth every morning.     . fluticasone (FLONASE) 50 MCG/ACT nasal spray Place 2 sprays into both nostrils daily as needed for allergies or rhinitis.    . furosemide (LASIX) 40 MG tablet Take 1 tablet (40 mg total) by mouth 2 (two) times daily. (Patient taking differently: Take 20 mg by mouth 2 (two) times daily. ) 60 tablet 6  . gabapentin (NEURONTIN) 300 MG capsule Take 300 mg by mouth 2 (two) times daily.    . insulin aspart protamine- aspart  (NOVOLOG MIX 70/30) (70-30) 100 UNIT/ML injection Inject 35 Units into the skin See admin instructions. Inject 35 units underskin two time daily before breakfast and dinner.    . isosorbide mononitrate (IMDUR) 30 MG 24 hr tablet Take 1 tablet (30 mg total) by mouth daily. 30 tablet 6  . loratadine (CLARITIN) 10 MG tablet Take 10 mg by mouth daily as needed for allergies.    Marland Kitchen losartan (COZAAR) 100 MG tablet Take 100 mg by mouth daily.    . Multiple Vitamin (MULTIVITAMIN) tablet Take 1 tablet by mouth daily.      . nitroGLYCERIN (NITROSTAT) 0.4 MG SL tablet Place 1 tablet (0.4 mg total) under the  tongue every 5 (five) minutes as needed for chest pain (up to 3 doses). 25 tablet 3  . tamsulosin (FLOMAX) 0.4 MG CAPS capsule Take 0.4 mg by mouth daily.    . metoCLOPramide (REGLAN) 5 MG tablet Take 1 tablet (5 mg total) by mouth 4 (four) times daily -  before meals and at bedtime. 28 tablet 0  . pantoprazole (PROTONIX) 40 MG tablet Take 40 mg by mouth daily.    Marland Kitchen PRESCRIPTION MEDICATION Place 1-2 drops into both eyes daily.     No facility-administered medications prior to visit.      Allergies:   Patient has no known allergies.   Social History   Social History  . Marital status: Single    Spouse name: N/A  . Number of children: 0  . Years of education: N/A   Occupational History  . retired   ran a Office manager course    Social History Main Topics  . Smoking status: Former Smoker    Packs/day: 1.50    Years: 35.00    Types: Cigarettes    Quit date: 10/12/1983  . Smokeless tobacco: Never Used  . Alcohol use 0.0 oz/week     Comment: 2-3 drinks 3-4x/wk.  . Drug use: No  . Sexual activity: Not Currently    Birth control/ protection: None   Other Topics Concern  . None   Social History Narrative   Lives in [place] by himself.  Active but does not routinely exercise.     Family History:  The patient's family history includes Asthma in his brother; Cardiomyopathy in his mother; Coronary artery  disease in his brother and father; Heart attack in his father; Kidney disease in his sister.   ROS:   Please see the history of present illness.    ROS All other systems reviewed and are negative.   PHYSICAL EXAM:   VS:  BP (!) 128/58   Pulse (!) 50   Ht 6' (1.829 m)   Wt 218 lb (98.9 kg)   BMI 29.57 kg/m    GEN: Well nourished, well developed, in no acute distress  HEENT: normal  Neck: no JVD, carotid bruits, or masses Cardiac: RRR; no murmurs, rubs, or gallops,no edema  Respiratory:  clear to auscultation bilaterally, normal work of breathing GI: soft, nontender, nondistended, + BS MS: no deformity or atrophy  Skin: warm and dry, no rash Neuro:  Alert and Oriented x 3, Strength and sensation are intact Psych: euthymic mood, full affect  Wt Readings from Last 3 Encounters:  06/09/17 218 lb (98.9 kg)  05/22/17 216 lb 1.6 oz (98 kg)  03/17/16 218 lb 6.4 oz (99.1 kg)      Studies/Labs Reviewed:   EKG:  EKG is ordered today.  The ekg ordered today demonstrates sinus brady with incomplete LBBB and downsloping ST segment in lateral leads unchanged from prior EKG  Recent Labs: 05/22/2017: ALT 46 06/09/2017: BUN 42; Creatinine, Ser 1.80; Hemoglobin 14.3; Platelets 86; Potassium 5.7; Sodium 139   Lipid Panel    Component Value Date/Time   CHOL 107 05/22/2017 0359   TRIG 212 (H) 05/22/2017 0359   HDL 36 (L) 05/22/2017 0359   CHOLHDL 3.0 05/22/2017 0359   VLDL 42 (H) 05/22/2017 0359   LDLCALC 29 05/22/2017 0359   LDLDIRECT 29.8 08/06/2013 0849    Additional studies/ records that were reviewed today include:   Cath 09/04/2012 Procedural Findings: Hemodynamics RA 13 RV 69/15 PA 64/21 mean 37 PCWP a wave 21 v wave  36, mean 21 LV 143/22 AO 143/60 mean 93  Oxygen saturations: PA 72 AO 91  Cardiac Output (Fick) 9.2  Cardiac Index (Fick) 4.3           Coronary angiography: Coronary dominance: right  Left mainstem: Left main is long. There is diffuse 50% mid  left main stenosis into 75% distal left main.  Left anterior descending (LAD): The LAD is totally occluded in its proximal aspect.  Left circumflex (LCx): The left circumflex is patent there is diffuse 50% proximal stenosis. There are 2 patent obtuse marginal branches.  Right coronary artery (RCA): The RCA is heavily calcified and severely diseased. The mid vessel is totally occluded.  Saphenous vein graft to PDA: Widely patent with patent stents in the proximal body of the graft.  Saphenous vein graft to PLA is patent there is mild in-stent restenosis in the proximal aspect of the graft but there is no significant disease noted. The PLA is a large branch with no significant stenosis  LIMA to LAD: Widely patent with no significant stenosis. The native vessel territory is more consistent with the diagonal territory than a true LAD and I suspect the native LAD is an anatomic variant the doesn't reach the left ventricular apex.  Left ventriculography: Deferred because of chronic kidney disease  Final Conclusions:   1. Severe three-vessel native coronary artery disease 2. Continued patency of the saphenous vein graft to PDA, saphenous vein graft to PLA, and LIMA to LAD 3. Moderate pulmonary hypertension likely related to left heart disease 4. Elevated left ventricular and pulmonary wedge pressure with large V waves  Recommendations: Continued medical therapy for treatment of congestive heart failure.    Echo 01/10/2014 - Left ventricle: The cavity size was moderately dilated. Wall thickness was increased in a pattern of moderate LVH. There was focal basal hypertrophy. Systolic function was moderately reduced. The estimated ejection fraction was in the range of 35% to 40%. Moderate hypokinesis. - Mitral valve: Mild regurgitation. - Left atrium: The atrium was mildly dilated. - Right atrium: The atrium was mildly to moderately dilated. - Pulmonary arteries: Systolic  pressure was moderately increased. PA peak pressure: 1mm Hg (S).   ASSESSMENT:    1. Precordial pain   2. Coronary artery disease involving coronary bypass graft of native heart with unstable angina pectoris ([place])   3. Essential hypertension   4. Hyperlipidemia, unspecified hyperlipidemia type   5. Chronic combined systolic and diastolic heart failure ([place])   6. IDDM (insulin dependent diabetes mellitus) ([place])   7. CKD (chronic kidney disease), stage III   8. OSA on CPAP      PLAN:  In order of problems listed above:  1. Progressive chest pain: Despite recent negative Myoview, he continued to have worsening chest discomfort. The location of the chest just comfort is different from his previous angina, however its progressive nature recently. I discussed the case with Dr. Percival Spanish who recommended cardiac catheterization for definitive assessment.  -Risk and benefit of procedure explained to the patient who display clear understanding and agree to proceed. Discussed with patient possible procedural risk include bleeding, vascular injury, renal injury, arrythmia, MI, stroke and loss of limb or life.  2. CAD s/p CABG: Progressive chest discomfort. Recent negative Myoview on 05/22/2017.  3. Hypertension: Blood pressure stable. Chronic bradycardia, however no symptom of dizziness.  4. Hyperlipidemia: On Lipitor 80 mg daily.  5. IDDM: On insulin.  6. Chronic combined systolic and diastolic heart failure: No obvious heart failure symptoms  on physical exam  7. CKD stage III: Basic metabolic panel prior to cath  8. OSA on CPAP: continue current therapy    Medication Adjustments/Labs and Tests Ordered: Current medicines are reviewed at length with the patient today.  Concerns regarding medicines are outlined above.  Medication changes, Labs and Tests ordered today are listed in the Patient Instructions below. Patient Instructions    [facility] [address] [place] [address] [place] [address] Dept: [phone] Loc: [place]  06/09/2017  You are scheduled for a Cardiac Catheterization on Friday, August 31 with Dr. Peter Martinique.  1. Please arrive at the [facility] (Main Entrance A) at [facility]: [address] [place], [place] [address] at 11:00 [place] parking service is available.   Special note: Every effort is made to have your procedure done on time. Please understand that emergencies sometimes delay scheduled procedures.  2. Diet: Do not eat or drink anything after midnight prior to your procedure except sips of water to take medications.  3. Labs: YOU WILL HAVE LAB WORK TODAY  4. Medication instructions in preparation for your procedure:  TAKE 1/2 OF THE EVENING DOSE OF INSULIN TONIGHT AND NONE Friday MORNING  On the morning of your procedure, take your Aspirin and any morning medicines NOT listed above.  You may use sips of water.  5. Plan for one night stay--bring personal belongings. 6. Bring a current list of your medications and current insurance cards. 7. You MUST have a responsible person to drive you home. 8. Someone MUST be with you the first 24 hours after you arrive home or your discharge will be delayed. 9. Please wear clothes that are easy to get on and off and wear slip-on shoes.  Thank you for allowing [place] to care for you!   -- [facility] [place] TO [address] MG [address]    Weston Brass Almyra [place], [place]  06/11/2017 9:27 AM    [place] [place], [place], [place]  [address] Phone: [phone]; Fax: [phone]

## 2017-06-09 NOTE — Patient Instructions (Signed)
   [facility] [address] [place] [address] Dept: [phone] Loc: [place]  06/09/2017  You are scheduled for a Cardiac Catheterization on Friday, August 31 with Dr. Peter Martinique.  1. Please arrive at the [place] (Main Entrance A) at [facility]: [address] [place], [place] [address] at 11:00 [place] parking service is available.   Special note: Every effort is made to have your procedure done on time. Please understand that emergencies sometimes delay scheduled procedures.  2. Diet: Do not eat or drink anything after midnight prior to your procedure except sips of water to take medications.  3. Labs: YOU WILL HAVE LAB WORK TODAY  4. Medication instructions in preparation for your procedure:  TAKE 1/2 OF THE EVENING DOSE OF INSULIN TONIGHT AND NONE Friday MORNING  On the morning of your procedure, take your Aspirin and any morning medicines NOT listed above.  You may use sips of water.  5. Plan for one night stay--bring personal belongings. 6. Bring a current list of your medications and current insurance cards. 7. You MUST have a responsible person to drive you home. 8. Someone MUST be with you the first 24 hours after you arrive home or your discharge will be delayed. 9. Please wear clothes that are easy to get on and off and wear slip-on shoes.  Thank you for allowing [place] to care for you!   -- [facility]    INCREASE PROTONIX TO 40 MG TWICE DAILY

## 2017-06-09 NOTE — Progress Notes (Signed)
When repeating BMET, also plan to repeat CBC as well, likely explaination for severely low platelet is due to clotting of samples.

## 2017-06-10 ENCOUNTER — Encounter (HOSPITAL_COMMUNITY): Admission: RE | Payer: Self-pay | Source: Ambulatory Visit

## 2017-06-10 ENCOUNTER — Telehealth: Payer: Self-pay | Admitting: *Deleted

## 2017-06-10 ENCOUNTER — Ambulatory Visit (HOSPITAL_COMMUNITY): Admission: RE | Admit: 2017-06-10 | Payer: Medicare HMO | Source: Ambulatory Visit | Admitting: Cardiology

## 2017-06-10 DIAGNOSIS — N289 Disorder of kidney and ureter, unspecified: Secondary | ICD-10-CM

## 2017-06-10 SURGERY — LEFT HEART CATH AND CORS/GRAFTS ANGIOGRAPHY
Anesthesia: LOCAL

## 2017-06-10 NOTE — Telephone Encounter (Signed)
Follow up         Pt is returning call to [facility].

## 2017-06-10 NOTE — Telephone Encounter (Signed)
Spoke with pt, aware of directions regarding cath and medication changes. He will return to the office 06-15-17 for bmp and cbc. Lab orders placed.

## 2017-06-10 NOTE — Telephone Encounter (Signed)
-----   Message from [place], [place] sent at 06/09/2017  5:16 PM EDT ----- Hold lasix for 2 days this weekend as well as on the day of cath

## 2017-06-10 NOTE — Telephone Encounter (Signed)
Cath rescheduled for Friday 06-17-17 @ 12 noon with dr Martinique. He will need to arrive 3 hours prior for hydration. Left message for pt to call to discuss cath and medication instruction.

## 2017-06-11 ENCOUNTER — Encounter: Payer: Self-pay | Admitting: Physician Assistant

## 2017-06-14 ENCOUNTER — Other Ambulatory Visit: Payer: Self-pay | Admitting: Physician Assistant

## 2017-06-15 ENCOUNTER — Telehealth: Payer: Self-pay

## 2017-06-15 DIAGNOSIS — N289 Disorder of kidney and ureter, unspecified: Secondary | ICD-10-CM | POA: Diagnosis not present

## 2017-06-15 NOTE — Telephone Encounter (Signed)
Patient contacted pre-catheterization at [facility] scheduled for:  06/17/2017 @ 1200 Verified arrival time and place:  [facility] @ 0900 Confirmed AM meds to be taken pre-cath with sip of water: Take ASA Hold losartan, lasix for 48 hours prior to procedure 70/30-take 1/2 dose evening prior to procedure and hold am of procedure  Confirmed patient has responsible person to drive home post procedure and observe patient for 24 hours:  yes Addl concerns:  None noted

## 2017-06-16 LAB — CBC
Hematocrit: 41.2 % (ref 37.5–51.0)
Hemoglobin: 12.9 g/dL — ABNORMAL LOW (ref 13.0–17.7)
MCH: 29.7 pg (ref 26.6–33.0)
MCHC: 31.3 g/dL — ABNORMAL LOW (ref 31.5–35.7)
MCV: 95 fL (ref 79–97)
Platelets: 87 10*3/uL — CL (ref 150–379)
RBC: 4.35 x10E6/uL (ref 4.14–5.80)
RDW: 14.6 % (ref 12.3–15.4)
WBC: 8.2 10*3/uL (ref 3.4–10.8)

## 2017-06-16 LAB — BASIC METABOLIC PANEL
BUN/Creatinine Ratio: 16 (ref 10–24)
BUN: 21 mg/dL (ref 8–27)
CO2: 26 mmol/L (ref 20–29)
Calcium: 9.5 mg/dL (ref 8.6–10.2)
Chloride: 101 mmol/L (ref 96–106)
Creatinine, Ser: 1.32 mg/dL — ABNORMAL HIGH (ref 0.76–1.27)
GFR calc Af Amer: 58 mL/min/{1.73_m2} — ABNORMAL LOW (ref >59)
GFR calc non Af Amer: 50 mL/min/{1.73_m2} — ABNORMAL LOW (ref >59)
Glucose: 152 mg/dL — ABNORMAL HIGH (ref 65–99)
Potassium: 5.7 mmol/L — ABNORMAL HIGH (ref 3.5–5.2)
Sodium: 139 mmol/L (ref 134–144)

## 2017-06-16 NOTE — Telephone Encounter (Signed)
Kidney function looks much better. Platelet still low, I did review the result with Dr. Martinique who felt platelet is stable.

## 2017-06-17 ENCOUNTER — Ambulatory Visit (HOSPITAL_COMMUNITY)
Admission: RE | Admit: 2017-06-17 | Discharge: 2017-06-17 | Disposition: A | Discharge status: Home / Self Care | Payer: Medicare HMO | Source: Ambulatory Visit | Attending: Cardiology | Admitting: Cardiology

## 2017-06-17 ENCOUNTER — Ambulatory Visit (HOSPITAL_COMMUNITY)
Admission: RE | Disposition: A | Discharge status: Home / Self Care | Payer: Self-pay | Source: Ambulatory Visit | Attending: Cardiology

## 2017-06-17 DIAGNOSIS — E785 Hyperlipidemia, unspecified: Secondary | ICD-10-CM | POA: Diagnosis present

## 2017-06-17 DIAGNOSIS — Z7983 Long term (current) use of bisphosphonates: Secondary | ICD-10-CM | POA: Insufficient documentation

## 2017-06-17 DIAGNOSIS — I25110 Atherosclerotic heart disease of native coronary artery with unstable angina pectoris: Secondary | ICD-10-CM | POA: Diagnosis not present

## 2017-06-17 DIAGNOSIS — K219 Gastro-esophageal reflux disease without esophagitis: Secondary | ICD-10-CM | POA: Diagnosis not present

## 2017-06-17 DIAGNOSIS — I130 Hypertensive heart and chronic kidney disease with heart failure and stage 1 through stage 4 chronic kidney disease, or unspecified chronic kidney disease: Secondary | ICD-10-CM | POA: Insufficient documentation

## 2017-06-17 DIAGNOSIS — Z7982 Long term (current) use of aspirin: Secondary | ICD-10-CM | POA: Diagnosis not present

## 2017-06-17 DIAGNOSIS — E1122 Type 2 diabetes mellitus with diabetic chronic kidney disease: Secondary | ICD-10-CM | POA: Diagnosis not present

## 2017-06-17 DIAGNOSIS — E119 Type 2 diabetes mellitus without complications: Secondary | ICD-10-CM

## 2017-06-17 DIAGNOSIS — I10 Essential (primary) hypertension: Secondary | ICD-10-CM | POA: Diagnosis present

## 2017-06-17 DIAGNOSIS — Z951 Presence of aortocoronary bypass graft: Secondary | ICD-10-CM | POA: Insufficient documentation

## 2017-06-17 DIAGNOSIS — I5042 Chronic combined systolic (congestive) and diastolic (congestive) heart failure: Secondary | ICD-10-CM | POA: Diagnosis not present

## 2017-06-17 DIAGNOSIS — R079 Chest pain, unspecified: Secondary | ICD-10-CM | POA: Diagnosis present

## 2017-06-17 DIAGNOSIS — G4733 Obstructive sleep apnea (adult) (pediatric): Secondary | ICD-10-CM | POA: Insufficient documentation

## 2017-06-17 DIAGNOSIS — M109 Gout, unspecified: Secondary | ICD-10-CM | POA: Insufficient documentation

## 2017-06-17 DIAGNOSIS — Z87891 Personal history of nicotine dependence: Secondary | ICD-10-CM | POA: Diagnosis not present

## 2017-06-17 DIAGNOSIS — Y831 Surgical operation with implant of artificial internal device as the cause of abnormal reaction of the patient, or of later complication, without mention of misadventure at the time of the procedure: Secondary | ICD-10-CM | POA: Insufficient documentation

## 2017-06-17 DIAGNOSIS — I2510 Atherosclerotic heart disease of native coronary artery without angina pectoris: Secondary | ICD-10-CM | POA: Diagnosis present

## 2017-06-17 DIAGNOSIS — T82855A Stenosis of coronary artery stent, initial encounter: Secondary | ICD-10-CM | POA: Insufficient documentation

## 2017-06-17 DIAGNOSIS — N183 Chronic kidney disease, stage 3 (moderate): Secondary | ICD-10-CM | POA: Diagnosis not present

## 2017-06-17 DIAGNOSIS — I200 Unstable angina: Secondary | ICD-10-CM | POA: Diagnosis present

## 2017-06-17 DIAGNOSIS — Z794 Long term (current) use of insulin: Secondary | ICD-10-CM | POA: Diagnosis not present

## 2017-06-17 DIAGNOSIS — Z79899 Other long term (current) drug therapy: Secondary | ICD-10-CM | POA: Diagnosis not present

## 2017-06-17 DIAGNOSIS — R001 Bradycardia, unspecified: Secondary | ICD-10-CM | POA: Diagnosis not present

## 2017-06-17 HISTORY — PX: LEFT HEART CATH AND CORS/GRAFTS ANGIOGRAPHY: CATH118250

## 2017-06-17 LAB — BASIC METABOLIC PANEL
Anion gap: 9 (ref 5–15)
BUN: 25 mg/dL — ABNORMAL HIGH (ref 6–20)
CO2: 25 mmol/L (ref 22–32)
Calcium: 9 mg/dL (ref 8.9–10.3)
Chloride: 104 mmol/L (ref 101–111)
Creatinine, Ser: 1.46 mg/dL — ABNORMAL HIGH (ref 0.61–1.24)
GFR calc Af Amer: 50 mL/min — ABNORMAL LOW (ref >60)
GFR calc non Af Amer: 43 mL/min — ABNORMAL LOW (ref >60)
Glucose, Bld: 170 mg/dL — ABNORMAL HIGH (ref 65–99)
Potassium: 5.1 mmol/L (ref 3.5–5.1)
Sodium: 138 mmol/L (ref 135–145)

## 2017-06-17 LAB — GLUCOSE, CAPILLARY
Glucose-Capillary: 162 mg/dL — ABNORMAL HIGH (ref 65–99)
Glucose-Capillary: 159 mg/dL — ABNORMAL HIGH (ref 65–99)

## 2017-06-17 SURGERY — LEFT HEART CATH AND CORS/GRAFTS ANGIOGRAPHY
Anesthesia: LOCAL

## 2017-06-17 MED ORDER — IOPAMIDOL (ISOVUE-370) INJECTION 76%
INTRAVENOUS | Status: DC | PRN
  Administered 2017-06-17: 105 mL via INTRAVENOUS

## 2017-06-17 MED ORDER — HEPARIN (PORCINE) IN NACL 2-0.9 UNIT/ML-% IJ SOLN
INTRAMUSCULAR | Status: AC | PRN
  Administered 2017-06-17: 1000 mL

## 2017-06-17 MED ORDER — LIDOCAINE HCL (PF) 1 % IJ SOLN
INTRAMUSCULAR | Status: AC
  Filled 2017-06-17: qty 30

## 2017-06-17 MED ORDER — FUROSEMIDE 40 MG PO TABS: 20.0000 mg | ORAL_TABLET | Freq: Two times a day (BID) | ORAL | 3 refills | Status: DC

## 2017-06-17 MED ORDER — HYDRALAZINE HCL 20 MG/ML IJ SOLN
INTRAMUSCULAR | Status: AC
  Filled 2017-06-17: qty 1

## 2017-06-17 MED ORDER — HEPARIN (PORCINE) IN NACL 2-0.9 UNIT/ML-% IJ SOLN
INTRAMUSCULAR | Status: AC
  Filled 2017-06-17: qty 1000

## 2017-06-17 MED ORDER — SODIUM CHLORIDE 0.9% FLUSH: 3.0000 mL | Freq: Two times a day (BID) | INTRAVENOUS | Status: DC

## 2017-06-17 MED ORDER — MIDAZOLAM HCL 2 MG/2ML IJ SOLN
INTRAMUSCULAR | Status: DC | PRN
  Administered 2017-06-17: 1 mg via INTRAVENOUS

## 2017-06-17 MED ORDER — SODIUM CHLORIDE 0.9 % WEIGHT BASED INFUSION
3.0000 mL/kg/h | INTRAVENOUS | Status: AC
  Administered 2017-06-17: 3 mL/kg/h via INTRAVENOUS

## 2017-06-17 MED ORDER — SODIUM CHLORIDE 0.9 % WEIGHT BASED INFUSION: 1.0000 mL/kg/h | INTRAVENOUS | Status: AC

## 2017-06-17 MED ORDER — SODIUM CHLORIDE 0.9% FLUSH: 3.0000 mL | INTRAVENOUS | Status: DC | PRN

## 2017-06-17 MED ORDER — HYDRALAZINE HCL 20 MG/ML IJ SOLN
10.0000 mg | Freq: Once | INTRAMUSCULAR | Status: AC
  Administered 2017-06-17: 10 mg via INTRAVENOUS

## 2017-06-17 MED ORDER — LIDOCAINE HCL (PF) 1 % IJ SOLN
INTRAMUSCULAR | Status: DC | PRN
  Administered 2017-06-17: 18 mL

## 2017-06-17 MED ORDER — ASPIRIN 81 MG PO CHEW: 81.0000 mg | CHEWABLE_TABLET | ORAL | Status: DC

## 2017-06-17 MED ORDER — SODIUM CHLORIDE 0.9 % IV SOLN: 250.0000 mL | INTRAVENOUS | Status: DC | PRN

## 2017-06-17 MED ORDER — SODIUM CHLORIDE 0.9 % WEIGHT BASED INFUSION: 1.0000 mL/kg/h | INTRAVENOUS | Status: DC

## 2017-06-17 MED ORDER — IOPAMIDOL (ISOVUE-370) INJECTION 76%
INTRAVENOUS | Status: AC
  Filled 2017-06-17: qty 125

## 2017-06-17 MED ORDER — MIDAZOLAM HCL 2 MG/2ML IJ SOLN
INTRAMUSCULAR | Status: AC
  Filled 2017-06-17: qty 2

## 2017-06-17 SURGICAL SUPPLY — 10 items
CATH INFINITI 5 FR RCB (CATHETERS) ×2 IMPLANT
CATH INFINITI 5FR MULTPACK ANG (CATHETERS) ×2 IMPLANT
HOVERMATT SINGLE USE (MISCELLANEOUS) ×2 IMPLANT
KIT HEART LEFT (KITS) ×2 IMPLANT
PACK CARDIAC CATHETERIZATION (CUSTOM PROCEDURE TRAY) ×2 IMPLANT
SHEATH PINNACLE 5F 10CM (SHEATH) ×2 IMPLANT
SYR MEDRAD MARK V 150ML (SYRINGE) ×2 IMPLANT
TRANSDUCER W/STOPCOCK (MISCELLANEOUS) ×2 IMPLANT
TUBING CIL FLEX 10 FLL-RA (TUBING) ×2 IMPLANT
WIRE EMERALD 3MM-J .035X150CM (WIRE) ×2 IMPLANT

## 2017-06-17 NOTE — H&P (View-Only) (Signed)
Cardiology Office Note    Date:  06/11/2017   ID:  Scott [name], DOB 1933-02-21, MRN [id number]  PCP:  Reubin Milan, MD  Cardiologist:  Dr. Martinique  Chief Complaint  Patient presents with  . Follow-up    post hospital     History of Present Illness:  Scott [name] is a 83 y.o. male with PMH of CAD s/p CABG (LIMA to LAD, SVG to PLA and SVG to PDA), HTN, HLD, chronic combined systolic and diastolic HF, IDDM, stage III CKD, gout, UTI, OSA on CPAP and dietary noncompliance. EF was 25-30% on echocardiogram in December 2013. Last cardiac catheterization was on 09/04/2012 which showed diffuse 50% mid left main stenosis into 75% distal left main, diffuse 50% left circumflex stenosis, mid RCA was totally occluded, widely patent SVG to PDA, patent SVG to PLA with mild in-stent restenosis in the proximal aspect of the graft, patent LIMA to LAD. Last echocardiogram obtained on 01/10/2014 showed EF has improved to 35-40%, moderate LVH, mild MR, PA peak pressure 50 mmHg. He was most recently admitted with chest pain on 05/21/2017. Serial troponin was borderline. He eventually underwent Myoview on 05/22/2017 which showed a small area of questionable ischemia versus attenuation artifact but overall felt to be low risk. He was able to ambulate without recurrent symptom limitation. Therefore he was discharged on the same day on 30 mg Imdur, lisinopril was discontinued.  Patient presents today for cardiology office visit. He describes this previous angina as a pain across the entire upper chest. However the recent symptom he went to the hospital for was a lower chest/upper abdomen pain. Since his discharge, he continued to have almost hourly episode of discomfort. It is occurring both when he is laying down and when he is sitting up. I will increase his Protonix to 40 mg twice a day. Interestingly, he was able to ride his bicycle for 10 minutes and swim for 10 minutes yesterday at [facility] without any discomfort. He is  planning to see a GI doctor. However he also mentions, there is no pain on palpation, food does not affect the degree of chest pain. The pain is also not affected by deep palpation, body rotation or deep inspiration. This morning, he had a prolonged chest pain, EKG does not show significant changes. I had a discussed the case with DOD Dr. Percival Spanish who agree that given worsening frequency of symptom, despite there are typical and atypical features, we need t to make sure there is no new blockages. We recommended cardiac catheterization, risk and benefit has been discussed with the patient will also agree to proceed.   Past Medical History:  Diagnosis Date  . Arthritis   . CAD (coronary artery disease)    a. 1997 CABGx3 (VG->RPDA, VG->PLV, LIMA->LAD);  b. 07/2004 PCI VG->PLV (3.5x16 Taxus DES, 3.5x12 Taxus DES); c. 11/2009 PCI: VG->PLV 3.5x12 Promus DES), VG->RPDA (3.5x15 Promus DES); d. 08/2012 Cath: patent grafts; e. 05/2013 Lexiscan MV: no ischemia/infarction, EF 44%.  . Cataract   . Chronic combined systolic and diastolic CHF (congestive heart failure) ([place]) 08/2012   a. EF 50-55% 11/2011; b. 08/2012 - EF 25-30%,  with mild RM, mod TR, mod RAE & LAE, moderate reduced RV systolic function and PASP of 82mmHg. - > cath showed patent grafts & Mod Puml HTN wth elevated PCWP;  c. 01/2014 Echo: EF 35-40%, mod LVH, mod HK, mildly dil LA, mod dil RA, PASP 68mmHg.  . CKD (chronic kidney disease), stage III   .  Dyslipidemia   . GERD (gastroesophageal reflux disease)   . Gout   . Hyperkalemia   . Hypertension   . Insulin Dependent Diabetes mellitus   . Sleep apnea   . Thrombocytopenia ([place])   . UTI (lower urinary tract infection) 08/2012   E.coli    Past Surgical History:  Procedure Laterality Date  . APPENDECTOMY    . BACK SURGERY     15 years ago  . CARDIAC CATHETERIZATION  2005   stenting of the vein graft to the posterior lateral per -- Dr. Martinique      . CARDIAC CATHETERIZATION  2011   showed  right coronary, totally occluded LAD and severe stenosis in both saphenous vein graft to the posterior lateral and posterior descending  -- stenting of the proximal portion of the SVG to the posterior lateral branch in 2/11  . CHOLECYSTECTOMY    . CORONARY ARTERY BYPASS GRAFT  1997    (LIMA to LAD, SVG to PDA, SVG to PL)  . LEFT AND RIGHT HEART CATHETERIZATION WITH CORONARY/GRAFT ANGIOGRAM  09/04/2012   Procedure: LEFT AND RIGHT HEART CATHETERIZATION WITH Beatrix Fetters;  Surgeon: Sherren Mocha, MD;  Location: [facility] C[facility] LAB;  patent grafts, mod 2ndary pulmonary HTN, elevated LV EDP & PCWP  . PARTIAL COLECTOMY     cancerous polyps    Current Medications: Outpatient Medications Prior to Visit  Medication Sig Dispense Refill  . allopurinol (ZYLOPRIM) 100 MG tablet Take 2 tablets (200 mg total) by mouth daily. 60 tablet 1  . aspirin 81 MG EC tablet Take 81 mg by mouth daily.      Marland Kitchen atorvastatin (LIPITOR) 80 MG tablet Take 80 mg by mouth every morning.    . carvedilol (COREG) 12.5 MG tablet Take 1 tablet (12.5 mg total) by mouth 2 (two) times daily with a meal. 60 tablet 6  . colchicine 0.6 MG tablet Take 1 tablet (0.6 mg total) by mouth 3 (three) times daily as needed. For gout pain. NEED OV. (Patient taking differently: Take 0.6-1.8 mg by mouth 3 (three) times daily as needed (for gout pain.). For gout pain. NEED OV.) 60 tablet 6  . Cyanocobalamin (VITAMIN B-12 PO) Take 1 tablet by mouth daily.    . famotidine (PEPCID) 20 MG tablet Take 20 mg by mouth every morning.     . fluticasone (FLONASE) 50 MCG/ACT nasal spray Place 2 sprays into both nostrils daily as needed for allergies or rhinitis.    . furosemide (LASIX) 40 MG tablet Take 1 tablet (40 mg total) by mouth 2 (two) times daily. (Patient taking differently: Take 20 mg by mouth 2 (two) times daily. ) 60 tablet 6  . gabapentin (NEURONTIN) 300 MG capsule Take 300 mg by mouth 2 (two) times daily.    . insulin aspart protamine- aspart  (NOVOLOG MIX 70/30) (70-30) 100 UNIT/ML injection Inject 35 Units into the skin See admin instructions. Inject 35 units underskin two time daily before breakfast and dinner.    . isosorbide mononitrate (IMDUR) 30 MG 24 hr tablet Take 1 tablet (30 mg total) by mouth daily. 30 tablet 6  . loratadine (CLARITIN) 10 MG tablet Take 10 mg by mouth daily as needed for allergies.    Marland Kitchen losartan (COZAAR) 100 MG tablet Take 100 mg by mouth daily.    . Multiple Vitamin (MULTIVITAMIN) tablet Take 1 tablet by mouth daily.      . nitroGLYCERIN (NITROSTAT) 0.4 MG SL tablet Place 1 tablet (0.4 mg total) under the  tongue every 5 (five) minutes as needed for chest pain (up to 3 doses). 25 tablet 3  . tamsulosin (FLOMAX) 0.4 MG CAPS capsule Take 0.4 mg by mouth daily.    . metoCLOPramide (REGLAN) 5 MG tablet Take 1 tablet (5 mg total) by mouth 4 (four) times daily -  before meals and at bedtime. 28 tablet 0  . pantoprazole (PROTONIX) 40 MG tablet Take 40 mg by mouth daily.    Marland Kitchen PRESCRIPTION MEDICATION Place 1-2 drops into both eyes daily.     No facility-administered medications prior to visit.      Allergies:   Patient has no known allergies.   Social History   Social History  . Marital status: Single    Spouse name: N/A  . Number of children: 0  . Years of education: N/A   Occupational History  . retired   ran a Office manager course    Social History Main Topics  . Smoking status: Former Smoker    Packs/day: 1.50    Years: 35.00    Types: Cigarettes    Quit date: 10/12/1983  . Smokeless tobacco: Never Used  . Alcohol use 0.0 oz/week     Comment: 2-3 drinks 3-4x/wk.  . Drug use: No  . Sexual activity: Not Currently    Birth control/ protection: None   Other Topics Concern  . None   Social History Narrative   Lives in [place] by himself.  Active but does not routinely exercise.     Family History:  The patient's family history includes Asthma in his brother; Cardiomyopathy in his mother; Coronary artery  disease in his brother and father; Heart attack in his father; Kidney disease in his sister.   ROS:   Please see the history of present illness.    ROS All other systems reviewed and are negative.   PHYSICAL EXAM:   VS:  BP (!) 128/58   Pulse (!) 50   Ht 6' (1.829 m)   Wt 218 lb (98.9 kg)   BMI 29.57 kg/m    GEN: Well nourished, well developed, in no acute distress  HEENT: normal  Neck: no JVD, carotid bruits, or masses Cardiac: RRR; no murmurs, rubs, or gallops,no edema  Respiratory:  clear to auscultation bilaterally, normal work of breathing GI: soft, nontender, nondistended, + BS MS: no deformity or atrophy  Skin: warm and dry, no rash Neuro:  Alert and Oriented x 3, Strength and sensation are intact Psych: euthymic mood, full affect  Wt Readings from Last 3 Encounters:  06/09/17 218 lb (98.9 kg)  05/22/17 216 lb 1.6 oz (98 kg)  03/17/16 218 lb 6.4 oz (99.1 kg)      Studies/Labs Reviewed:   EKG:  EKG is ordered today.  The ekg ordered today demonstrates sinus brady with incomplete LBBB and downsloping ST segment in lateral leads unchanged from prior EKG  Recent Labs: 05/22/2017: ALT 46 06/09/2017: BUN 42; Creatinine, Ser 1.80; Hemoglobin 14.3; Platelets 86; Potassium 5.7; Sodium 139   Lipid Panel    Component Value Date/Time   CHOL 107 05/22/2017 0359   TRIG 212 (H) 05/22/2017 0359   HDL 36 (L) 05/22/2017 0359   CHOLHDL 3.0 05/22/2017 0359   VLDL 42 (H) 05/22/2017 0359   LDLCALC 29 05/22/2017 0359   LDLDIRECT 29.8 08/06/2013 0849    Additional studies/ records that were reviewed today include:   Cath 09/04/2012 Procedural Findings: Hemodynamics RA 13 RV 69/15 PA 64/21 mean 37 PCWP a wave 21 v wave  36, mean 21 LV 143/22 AO 143/60 mean 93  Oxygen saturations: PA 72 AO 91  Cardiac Output (Fick) 9.2  Cardiac Index (Fick) 4.3           Coronary angiography: Coronary dominance: right  Left mainstem: Left main is long. There is diffuse 50% mid  left main stenosis into 75% distal left main.  Left anterior descending (LAD): The LAD is totally occluded in its proximal aspect.  Left circumflex (LCx): The left circumflex is patent there is diffuse 50% proximal stenosis. There are 2 patent obtuse marginal branches.  Right coronary artery (RCA): The RCA is heavily calcified and severely diseased. The mid vessel is totally occluded.  Saphenous vein graft to PDA: Widely patent with patent stents in the proximal body of the graft.  Saphenous vein graft to PLA is patent there is mild in-stent restenosis in the proximal aspect of the graft but there is no significant disease noted. The PLA is a large branch with no significant stenosis  LIMA to LAD: Widely patent with no significant stenosis. The native vessel territory is more consistent with the diagonal territory than a true LAD and I suspect the native LAD is an anatomic variant the doesn't reach the left ventricular apex.  Left ventriculography: Deferred because of chronic kidney disease  Final Conclusions:   1. Severe three-vessel native coronary artery disease 2. Continued patency of the saphenous vein graft to PDA, saphenous vein graft to PLA, and LIMA to LAD 3. Moderate pulmonary hypertension likely related to left heart disease 4. Elevated left ventricular and pulmonary wedge pressure with large V waves  Recommendations: Continued medical therapy for treatment of congestive heart failure.    Echo 01/10/2014 - Left ventricle: The cavity size was moderately dilated. Wall thickness was increased in a pattern of moderate LVH. There was focal basal hypertrophy. Systolic function was moderately reduced. The estimated ejection fraction was in the range of 35% to 40%. Moderate hypokinesis. - Mitral valve: Mild regurgitation. - Left atrium: The atrium was mildly dilated. - Right atrium: The atrium was mildly to moderately dilated. - Pulmonary arteries: Systolic  pressure was moderately increased. PA peak pressure: 20mm Hg (S).   ASSESSMENT:    1. Precordial pain   2. Coronary artery disease involving coronary bypass graft of native heart with unstable angina pectoris ([place])   3. Essential hypertension   4. Hyperlipidemia, unspecified hyperlipidemia type   5. Chronic combined systolic and diastolic heart failure ([place])   6. IDDM (insulin dependent diabetes mellitus) ([place])   7. CKD (chronic kidney disease), stage III   8. OSA on CPAP      PLAN:  In order of problems listed above:  1. Progressive chest pain: Despite recent negative Myoview, he continued to have worsening chest discomfort. The location of the chest just comfort is different from his previous angina, however its progressive nature recently. I discussed the case with Dr. Percival Spanish who recommended cardiac catheterization for definitive assessment.  -Risk and benefit of procedure explained to the patient who display clear understanding and agree to proceed. Discussed with patient possible procedural risk include bleeding, vascular injury, renal injury, arrythmia, MI, stroke and loss of limb or life.  2. CAD s/p CABG: Progressive chest discomfort. Recent negative Myoview on 05/22/2017.  3. Hypertension: Blood pressure stable. Chronic bradycardia, however no symptom of dizziness.  4. Hyperlipidemia: On Lipitor 80 mg daily.  5. IDDM: On insulin.  6. Chronic combined systolic and diastolic heart failure: No obvious heart failure symptoms  on physical exam  7. CKD stage III: Basic metabolic panel prior to cath  8. OSA on CPAP: continue current therapy    Medication Adjustments/Labs and Tests Ordered: Current medicines are reviewed at length with the patient today.  Concerns regarding medicines are outlined above.  Medication changes, Labs and Tests ordered today are listed in the Patient Instructions below. Patient Instructions    [facility] [address] [place] [address] Dept: [phone] Loc: [place]  06/09/2017  You are scheduled for a Cardiac Catheterization on Friday, August 31 with Dr. Peter Martinique.  1. Please arrive at the [facility] (Main Entrance A) at [facility]: [address] [place], [place] [address] at 11:00 [place] parking service is available.   Special note: Every effort is made to have your procedure done on time. Please understand that emergencies sometimes delay scheduled procedures.  2. Diet: Do not eat or drink anything after midnight prior to your procedure except sips of water to take medications.  3. Labs: YOU WILL HAVE LAB WORK TODAY  4. Medication instructions in preparation for your procedure:  TAKE 1/2 OF THE EVENING DOSE OF INSULIN TONIGHT AND NONE Friday MORNING  On the morning of your procedure, take your Aspirin and any morning medicines NOT listed above.  You may use sips of water.  5. Plan for one night stay--bring personal belongings. 6. Bring a current list of your medications and current insurance cards. 7. You MUST have a responsible person to drive you home. 8. Someone MUST be with you the first 24 hours after you arrive home or your discharge will be delayed. 9. Please wear clothes that are easy to get on and off and wear slip-on shoes.  Thank you for allowing [place] to care for you!   -- [facility] [place] TO [address] MG [address]    Weston Brass Almyra Deforest, [place]  06/11/2017 9:27 AM    [facility], [place], [place]  [address] Phone: [phone]; Fax: [phone]

## 2017-06-17 NOTE — Interval H&P Note (Signed)
History and Physical Interval Note:  06/17/2017 1:07 PM  Scott [name]  has presented today for surgery, with the diagnosis of  chest pain  The various methods of treatment have been discussed with the patient and family. After consideration of risks, benefits and other options for treatment, the patient has consented to  Procedure(s): LEFT HEART CATH AND CORS/GRAFTS ANGIOGRAPHY (N/A) as a surgical intervention .  The patient's history has been reviewed, patient examined, no change in status, stable for surgery.  I have reviewed the patient's chart and labs.  Questions were answered to the patient's satisfaction.   Cath Lab Visit (complete for each Cath Lab visit)  Clinical Evaluation Leading to the Procedure:   ACS: Yes.    Non-ACS:    Anginal Classification: CCS III  Anti-ischemic medical therapy: Maximal Therapy (2 or more classes of medications)  Non-Invasive Test Results: Low-risk stress test findings: cardiac mortality <1%/year  Prior CABG: Previous CABG        Scott [name] Martinique MD,FACC 06/17/2017 1:09 PM

## 2017-06-17 NOTE — Discharge Instructions (Signed)
Angiogram, Care After °This sheet gives you information about how to care for yourself after your procedure. Your health care provider may also give you more specific instructions. If you have problems or questions, contact your health care provider. °What can I expect after the procedure? °After the procedure, it is common to have bruising and tenderness at the catheter insertion area. °Follow these instructions at home: °Insertion site care  °· Follow instructions from your health care provider about how to take care of your insertion site. Make sure you: °¨ Wash your hands with soap and water before you change your bandage (dressing). If soap and water are not available, use hand sanitizer. °¨ Change your dressing as told by your health care provider. °¨ Leave stitches (sutures), skin glue, or adhesive strips in place. These skin closures may need to stay in place for 2 weeks or longer. If adhesive strip edges start to loosen and curl up, you may trim the loose edges. Do not remove adhesive strips completely unless your health care provider tells you to do that. °· Do not take baths, swim, or use a hot tub until your health care provider approves. °· You may shower 24-48 hours after the procedure or as told by your health care provider. °¨ Gently wash the site with plain soap and water. °¨ Pat the area dry with a clean towel. °¨ Do not rub the site. This may cause bleeding. °· Do not apply powder or lotion to the site. Keep the site clean and dry. °· Check your insertion site every day for signs of infection. Check for: °¨ Redness, swelling, or pain. °¨ Fluid or blood. °¨ Warmth. °¨ Pus or a bad smell. °Activity  °· Rest as told by your health care provider, usually for 1-2 days. °· Do not lift anything that is heavier than 10 lbs. (4.5 kg) or as told by your health care provider. °· Do not drive for 24 hours if you were given a medicine to help you relax (sedative). °· Do not drive or use heavy machinery while  taking prescription pain medicine. °General instructions  °· Return to your normal activities as told by your health care provider, usually in about a week. Ask your health care provider what activities are safe for you. °· If the catheter site starts bleeding, lie flat and put pressure on the site. If the bleeding does not stop, get help right away. This is a medical emergency. °· Drink enough fluid to keep your urine clear or pale yellow. This helps flush the contrast dye from your body. °· Take over-the-counter and prescription medicines only as told by your health care provider. °· Keep all follow-up visits as told by your health care provider. This is important. °Contact a health care provider if: °· You have a fever or chills. °· You have redness, swelling, or pain around your insertion site. °· You have fluid or blood coming from your insertion site. °· The insertion site feels warm to the touch. °· You have pus or a bad smell coming from your insertion site. °· You have bruising around the insertion site. °· You notice blood collecting in the tissue around the catheter site (hematoma). The hematoma may be painful to the touch. °Get help right away if: °· You have severe pain at the catheter insertion area. °· The catheter insertion area swells very fast. °· The catheter insertion area is bleeding, and the bleeding does not stop when you hold steady pressure on   the area. °· The area near or just beyond the catheter insertion site becomes pale, cool, tingly, or numb. °These symptoms may represent a serious problem that is an emergency. Do not wait to see if the symptoms will go away. Get medical help right away. Call your local emergency services (911 in the [place] Do not drive yourself to the hospital. °Summary °· After the procedure, it is common to have bruising and tenderness at the catheter insertion area. °· After the procedure, it is important to rest and drink plenty of fluids. °· Do not take baths,  swim, or use a hot tub until your health care provider says it is okay to do so. You may shower 24-48 hours after the procedure or as told by your health care provider. °· If the catheter site starts bleeding, lie flat and put pressure on the site. If the bleeding does not stop, get help right away. This is a medical emergency. °This information is not intended to replace advice given to you by your health care provider. Make sure you discuss any questions you have with your health care provider. °Document Released: 04/15/2005 Document Revised: 09/01/2016 Document Reviewed: 09/01/2016 °Elsevier Interactive Patient Education © 2017 [employer] ° °

## 2017-06-17 NOTE — Progress Notes (Signed)
Site area: Right groin a 5 french arterial sheath was removed  Site Prior to Removal:  Level 0  Pressure Applied For 20 MINUTES    Bedrest Beginning at 1430p  Manual:   Yes.    Patient Status During Pull:  stable  Post Pull Groin Site:  Level 0  Post Pull Instructions Given:  Yes.    Post Pull Pulses Present:  Yes.    Dressing Applied:  Yes.    Comments:  VS remain stable

## 2017-06-20 ENCOUNTER — Encounter (HOSPITAL_COMMUNITY): Payer: Self-pay | Admitting: Cardiology

## 2017-06-20 MED FILL — Heparin Sodium (Porcine) 2 Unit/ML in Sodium Chloride 0.9%: INTRAMUSCULAR | Qty: 500 | Status: AC

## 2017-06-30 ENCOUNTER — Telehealth: Payer: Self-pay | Admitting: Cardiology

## 2017-06-30 NOTE — Telephone Encounter (Signed)
Spoke to pt. He indicates the GI referral was discussed at [place] visit.  Please advise - OK to order GI referral?   Informed pt I will f/u w him.

## 2017-06-30 NOTE — Telephone Encounter (Signed)
°  New Message   pt verbalized that he is calling for rn   Dr.Jordan told him that he would refer him to a GI physician

## 2017-06-30 NOTE — Telephone Encounter (Signed)
Yes refer to GI for evaluation of noncardiac chest pain.  Shanzay Hepworth Martinique MD, [facility]

## 2017-07-01 NOTE — Telephone Encounter (Signed)
Returned call to patient left Dr.Jordan's recommendations on personal voice mail.Scheduler will call back to schedule GI appointment.

## 2017-07-06 ENCOUNTER — Other Ambulatory Visit: Payer: Self-pay

## 2017-07-06 DIAGNOSIS — R079 Chest pain, unspecified: Secondary | ICD-10-CM

## 2017-08-31 ENCOUNTER — Encounter: Payer: Self-pay | Admitting: Gastroenterology

## 2017-08-31 ENCOUNTER — Ambulatory Visit (INDEPENDENT_AMBULATORY_CARE_PROVIDER_SITE_OTHER): Payer: Medicare HMO | Admitting: Gastroenterology

## 2017-08-31 VITALS — BP 158/82 | HR 60 | Ht 72.0 in | Wt 223.8 lb

## 2017-08-31 DIAGNOSIS — G8929 Other chronic pain: Secondary | ICD-10-CM

## 2017-08-31 DIAGNOSIS — I5042 Chronic combined systolic (congestive) and diastolic (congestive) heart failure: Secondary | ICD-10-CM

## 2017-08-31 DIAGNOSIS — R1013 Epigastric pain: Secondary | ICD-10-CM

## 2017-08-31 DIAGNOSIS — Z794 Long term (current) use of insulin: Secondary | ICD-10-CM | POA: Diagnosis not present

## 2017-08-31 DIAGNOSIS — N183 Chronic kidney disease, stage 3 unspecified: Secondary | ICD-10-CM

## 2017-08-31 DIAGNOSIS — E119 Type 2 diabetes mellitus without complications: Secondary | ICD-10-CM | POA: Diagnosis not present

## 2017-08-31 DIAGNOSIS — I2510 Atherosclerotic heart disease of native coronary artery without angina pectoris: Secondary | ICD-10-CM | POA: Diagnosis not present

## 2017-08-31 NOTE — Progress Notes (Signed)
[facility] Consult Note:  History: Scott [name] 08/31/2017  Referring physician: Reubin Milan, MD  Reason for consult/chief complaint: Non cardiac chest pain (Had cardiac workup that was negative per patient) and Gastroesophageal Reflux   Subjective  HPI:  This is an 83 year old man referred by primary care and his cardiologist regarding chest pain.  He has known coronary disease with a prior two-vessel bypass.  Scott [name] came to the ED in early August with severe lower sternal chest pain, and ruled out for MI.  He was then followed up by cardiology soon afterwards, and although there was not a clear exertional component, it was felt best that he should undergo a catheterization.  I reviewed this report, it showed no dominant lesions in his bypassed vessels to explain the pain.  This test on September 7 also showed continued cardiomyopathy with ejection fraction about 25%. Scott [name] has had several episodes of this pain since then, usually lasting about 5 minutes and they have not brought him to the ED.  He says on one occasion he was about to get in his car and come to the ED for it, but it resolved.  He took nitroglycerin with perhaps the first episode in early August, but not with subsequent episodes.  He took some Pepto-Bismol or Maalox for these episodes and they seem to get better.  There have not been any episodes where he did not take something so it is unknown if they might have resolved on their own. Scott [name] has been on a twice daily PPI for many years and was recently put on Pepcid because of this pain. In addition, when I noted that he has thrombocytopenia, he says this is been evaluated the [place], but there are no records for review regarding that. As an aside, he has a history of a carcinoma in situ and a polyp in 2005 during a colonoscopy at [facility].  This apparently resulted in a right hemicolectomy.  We have reports from a colonoscopy done at [facility] in 2008 where only  hyperplastic polyp was found.  He was advised to have a repeat colonoscopy in 5 years, and he believes that he did do so.  He cannot recall where or when he had that done, but thinks it may have been at the [place].  We do not have that record today.   ROS:  Review of Systems  Constitutional: Negative for appetite change and unexpected weight change.  HENT: Negative for mouth sores and voice change.   Eyes: Negative for pain and redness.  Respiratory: Negative for cough and shortness of breath.   Cardiovascular: Positive for chest pain. Negative for palpitations.  Genitourinary: Negative for dysuria and hematuria.  Musculoskeletal: Negative for arthralgias and myalgias.  Skin: Negative for pallor and rash.  Neurological: Negative for weakness and headaches.  Hematological: Negative for adenopathy.     Past Medical History: Past Medical History:  Diagnosis Date  . Arthritis   . CAD (coronary artery disease)    a. 1997 CABGx3 (VG->RPDA, VG->PLV, LIMA->LAD);  b. 07/2004 PCI VG->PLV (3.5x16 Taxus DES, 3.5x12 Taxus DES); c. 11/2009 PCI: VG->PLV 3.5x12 Promus DES), VG->RPDA (3.5x15 Promus DES); d. 08/2012 Cath: patent grafts; e. 05/2013 Lexiscan MV: no ischemia/infarction, EF 44%.  . Cataract   . Chronic combined systolic and diastolic CHF (congestive heart failure) ([place]) 08/2012   a. EF 50-55% 11/2011; b. 08/2012 - EF 25-30%,  with mild RM, mod TR, mod RAE & LAE, moderate reduced RV systolic function and PASP  of 24mmHg. - > cath showed patent grafts & Mod Puml HTN wth elevated PCWP;  c. 01/2014 Echo: EF 35-40%, mod LVH, mod HK, mildly dil LA, mod dil RA, PASP 65mmHg.  . CKD (chronic kidney disease), stage III ([place])   . Dyslipidemia   . GERD (gastroesophageal reflux disease)   . Gout   . Hyperkalemia   . Hypertension   . Insulin Dependent Diabetes mellitus   . Sleep apnea   . Thrombocytopenia ([place])   . UTI (lower urinary tract infection) 08/2012   E.coli     Past Surgical History: Past  Surgical History:  Procedure Laterality Date  . APPENDECTOMY    . BACK SURGERY     15 years ago  . CARDIAC CATHETERIZATION  2005   stenting of the vein graft to the posterior lateral per -- Dr. Martinique      . CARDIAC CATHETERIZATION  2011   showed right coronary, totally occluded LAD and severe stenosis in both saphenous vein graft to the posterior lateral and posterior descending  -- stenting of the proximal portion of the SVG to the posterior lateral branch in 2/11  . CHOLECYSTECTOMY    . CORONARY ARTERY BYPASS GRAFT  1997    (LIMA to LAD, SVG to PDA, SVG to PL)  . LEFT AND RIGHT HEART CATHETERIZATION WITH CORONARY/GRAFT ANGIOGRAM  09/04/2012   Procedure: LEFT AND RIGHT HEART CATHETERIZATION WITH Beatrix Fetters;  Surgeon: Sherren Mocha, MD;  Location: [facility] CATH LAB;  patent grafts, mod 2ndary pulmonary HTN, elevated LV EDP & PCWP  . LEFT HEART CATH AND CORS/GRAFTS ANGIOGRAPHY N/A 06/17/2017   Procedure: LEFT HEART CATH AND CORS/GRAFTS ANGIOGRAPHY;  Surgeon: Martinique, Peter M, MD;  Location: [place] CV LAB;  Service: Cardiovascular;  Laterality: N/A;  . PARTIAL COLECTOMY     cancerous polyps     Family History: Family History  Problem Relation Age of Onset  . Coronary artery disease Father   . Heart attack Father   . Coronary artery disease Brother        1/2 brother   . Kidney disease Sister   . Cardiomyopathy Mother   . Asthma Brother     Social History: Social History   Socioeconomic History  . Marital status: Single    Spouse name: None  . Number of children: 0  . Years of education: None  . Highest education level: None  Social Needs  . Financial resource strain: None  . Food insecurity - worry: None  . Food insecurity - inability: None  . Transportation needs - medical: None  . Transportation needs - non-medical: None  Occupational History  . Occupation: retired   ran a Office manager course  Tobacco Use  . Smoking status: Former Smoker    Packs/day: 1.50     Years: 35.00    Pack years: 52.50    Types: Cigarettes    Last attempt to quit: 10/12/1983    Years since quitting: 33.9  . Smokeless tobacco: Never Used  Substance and Sexual Activity  . Alcohol use: Yes    Alcohol/week: 0.0 oz    Comment: 2-3 drinks 3-4x/wk.  . Drug use: No  . Sexual activity: Not Currently    Birth control/protection: None  Other Topics Concern  . None  Social History Narrative   Lives in [place] by himself.  Active but does not routinely exercise.    Allergies: No Known Allergies  Outpatient Meds: Current Outpatient Medications  Medication Sig Dispense Refill  . allopurinol (  ZYLOPRIM) 100 MG tablet Take 2 tablets (200 mg total) by mouth daily. 60 tablet 1  . aspirin 81 MG EC tablet Take 81 mg by mouth daily.      Marland Kitchen atorvastatin (LIPITOR) 80 MG tablet Take 80 mg by mouth every morning.    . carvedilol (COREG) 12.5 MG tablet Take 1 tablet (12.5 mg total) by mouth 2 (two) times daily with a meal. 60 tablet 6  . colchicine 0.6 MG tablet Take 1 tablet (0.6 mg total) by mouth 3 (three) times daily as needed. For gout pain. NEED OV. (Patient taking differently: Take 0.6-1.8 mg by mouth 3 (three) times daily as needed (for gout pain.). For gout pain. NEED OV.) 60 tablet 6  . Cyanocobalamin (VITAMIN B-12 PO) Take 1 tablet by mouth daily.    . famotidine (PEPCID) 20 MG tablet Take 20 mg by mouth every morning.     . fluticasone (FLONASE) 50 MCG/ACT nasal spray Place 2 sprays into both nostrils daily as needed for allergies or rhinitis.    . furosemide (LASIX) 40 MG tablet Take 0.5 tablets (20 mg total) by mouth 2 (two) times daily. 180 tablet 3  . gabapentin (NEURONTIN) 300 MG capsule Take 300 mg by mouth 2 (two) times daily.    . insulin aspart protamine- aspart (NOVOLOG MIX 70/30) (70-30) 100 UNIT/ML injection Inject 35 Units into the skin See admin instructions. Inject 35 units underskin two time daily before breakfast and dinner.    . isosorbide mononitrate (IMDUR) 30 MG  24 hr tablet Take 1 tablet (30 mg total) by mouth daily. 30 tablet 6  . latanoprost (XALATAN) 0.005 % ophthalmic solution Place 1 drop into both eyes at bedtime.    Marland Kitchen loratadine (CLARITIN) 10 MG tablet Take 10 mg by mouth daily as needed for allergies.    Marland Kitchen losartan (COZAAR) 100 MG tablet Take 100 mg by mouth daily.    . Multiple Vitamin (MULTIVITAMIN) tablet Take 1 tablet by mouth daily.      . nitroGLYCERIN (NITROSTAT) 0.4 MG SL tablet Place 1 tablet (0.4 mg total) under the tongue every 5 (five) minutes as needed for chest pain (up to 3 doses). 25 tablet 3  . pantoprazole (PROTONIX) 40 MG tablet Take 1 tablet (40 mg total) by mouth 2 (two) times daily. 180 tablet 3  . tamsulosin (FLOMAX) 0.4 MG CAPS capsule Take 0.4 mg by mouth daily.     No current facility-administered medications for this visit.       ___________________________________________________________________ Objective   Exam:  BP (!) 158/82   Pulse 60   Ht 6' (1.829 m)   Wt 223 lb 12.8 oz (101.5 kg)   SpO2 95%   BMI 30.35 kg/m    General: this is a(n) elderly man in no acute distress.  He is pleasant and conversational.  Eyes: sclera anicteric, no redness  ENT: oral mucosa moist without lesions, no cervical or supraclavicular lymphadenopathy, good dentition  CV: RRR without murmur, S1/split S2, no JVD, no peripheral edema  Resp: clear to auscultation bilaterally, normal RR and effort noted  GI: soft, no tenderness, with active bowel sounds. No guarding or palpable organomegaly noted. midline large hernia over scar  Skin; warm and dry, no rash or jaundice noted  Neuro: awake, alert and oriented x 3. Normal gross motor function and fluent speech  Labs:  CMP Latest Ref Rng & Units 06/17/2017 06/15/2017 06/09/2017  Glucose 65 - 99 mg/dL 170(H) 152(H) 217(H)  BUN 6 - 20  mg/dL 25(H) 21 42(H)  Creatinine 0.61 - 1.24 mg/dL 1.46(H) 1.32(H) 1.80(H)  Sodium 135 - 145 mmol/L 138 139 139  Potassium 3.5 - 5.1 mmol/L  5.1 5.7(H) 5.7(H)  Chloride 101 - 111 mmol/L 104 101 97  CO2 22 - 32 mmol/L 25 26 27   Calcium 8.9 - 10.3 mg/dL 9.0 9.5 9.9  Total Protein 6.5 - 8.1 g/dL - - -  Total Bilirubin 0.3 - 1.2 mg/dL - - -  Alkaline Phos 38 - 126 U/L - - -  AST 15 - 41 U/L - - -  ALT 17 - 63 U/L - - -   CBC Latest Ref Rng & Units 06/15/2017 06/09/2017 05/21/2017  WBC 3.4 - 10.8 x10E3/uL 8.2 6.9 7.0  Hemoglobin 13.0 - 17.7 g/dL 12.9(L) 14.3 13.8  Hematocrit 37.5 - 51.0 % 41.2 43.6 43.6  Platelets 150 - 379 x10E3/uL 87(LL) 86(LL) 73(L)     Radiologic Studies: PA and lateral chest x-ray done during episode of chest pain 1118, no other imaging of the chest or abdomen performed.  Assessment: Encounter Diagnoses  Name Primary?  . Abdominal pain, chronic, epigastric Yes  . Chronic combined systolic and diastolic heart failure ([place])   . Arteriosclerotic coronary artery disease   . Diabetes mellitus, type II, insulin dependent ([place])   . CKD (chronic kidney disease) stage 3, GFR 30-59 ml/min ([facility])     Epigastric pain that is radiating along bilateral lower rib cage, brief episodes that are nonexertional and difficult to characterize.  He does not feel they are typical of his previous reflux symptoms.  He is also been on chronic PPI for many years.  It is not yet clear if this pain is digestive in nature, but I think it warrants an upper endoscopy to try and understand it better.  He is agreeable after a thorough discussion of procedure and risks.  The benefits and risks of the planned procedure were described in detail with the patient or (when appropriate) their health care proxy.  Risks were outlined as including, but not limited to, bleeding, infection, perforation, adverse medication reaction leading to cardiac or pulmonary decompensation, or pancreatitis (if ERCP).  The limitation of incomplete mucosal visualization was also discussed.  No guarantees or warranties were given.  The procedure will be done at the  hospital endoscopy lab due to the increased risk from his medical comorbidities, especially congestive heart failure.  I also feel he can discontinue famotidine.  Thank you for the courtesy of this consult.  Please call me with any questions or concerns.  Nelida Meuse III  CC: Reubin Milan, MD  Peter Martinique, MD

## 2017-08-31 NOTE — H&P (View-Only) (Signed)
[facility] Consult Note:  History: Scott [name] 08/31/2017  Referring physician: Reubin Milan, MD  Reason for consult/chief complaint: Non cardiac chest pain (Had cardiac workup that was negative per patient) and Gastroesophageal Reflux   Subjective  HPI:  This is an 83 year old man referred by primary care and his cardiologist regarding chest pain.  He has known coronary disease with a prior two-vessel bypass.  Scott [name] came to the ED in early August with severe lower sternal chest pain, and ruled out for MI.  He was then followed up by cardiology soon afterwards, and although there was not a clear exertional component, it was felt best that he should undergo a catheterization.  I reviewed this report, it showed no dominant lesions in his bypassed vessels to explain the pain.  This test on September 7 also showed continued cardiomyopathy with ejection fraction about 25%. Scott [name] has had several episodes of this pain since then, usually lasting about 5 minutes and they have not brought him to the ED.  He says on one occasion he was about to get in his car and come to the ED for it, but it resolved.  He took nitroglycerin with perhaps the first episode in early August, but not with subsequent episodes.  He took some Pepto-Bismol or Maalox for these episodes and they seem to get better.  There have not been any episodes where he did not take something so it is unknown if they might have resolved on their own. Scott [name] has been on a twice daily PPI for many years and was recently put on Pepcid because of this pain. In addition, when I noted that he has thrombocytopenia, he says this is been evaluated the [place], but there are no records for review regarding that. As an aside, he has a history of a carcinoma in situ and a polyp in 2005 during a colonoscopy at [facility].  This apparently resulted in a right hemicolectomy.  We have reports from a colonoscopy done at [facility] in 2008 where only  hyperplastic polyp was found.  He was advised to have a repeat colonoscopy in 5 years, and he believes that he did do so.  He cannot recall where or when he had that done, but thinks it may have been at the [place].  We do not have that record today.   ROS:  Review of Systems  Constitutional: Negative for appetite change and unexpected weight change.  HENT: Negative for mouth sores and voice change.   Eyes: Negative for pain and redness.  Respiratory: Negative for cough and shortness of breath.   Cardiovascular: Positive for chest pain. Negative for palpitations.  Genitourinary: Negative for dysuria and hematuria.  Musculoskeletal: Negative for arthralgias and myalgias.  Skin: Negative for pallor and rash.  Neurological: Negative for weakness and headaches.  Hematological: Negative for adenopathy.     Past Medical History: Past Medical History:  Diagnosis Date  . Arthritis   . CAD (coronary artery disease)    a. 1997 CABGx3 (VG->RPDA, VG->PLV, LIMA->LAD);  b. 07/2004 PCI VG->PLV (3.5x16 Taxus DES, 3.5x12 Taxus DES); c. 11/2009 PCI: VG->PLV 3.5x12 Promus DES), VG->RPDA (3.5x15 Promus DES); d. 08/2012 Cath: patent grafts; e. 05/2013 Lexiscan MV: no ischemia/infarction, EF 44%.  . Cataract   . Chronic combined systolic and diastolic CHF (congestive heart failure) ([place]) 08/2012   a. EF 50-55% 11/2011; b. 08/2012 - EF 25-30%,  with mild RM, mod TR, mod RAE & LAE, moderate reduced RV systolic function and PASP  of 37mmHg. - > cath showed patent grafts & Mod Puml HTN wth elevated PCWP;  c. 01/2014 Echo: EF 35-40%, mod LVH, mod HK, mildly dil LA, mod dil RA, PASP 63mmHg.  . CKD (chronic kidney disease), stage III ([place])   . Dyslipidemia   . GERD (gastroesophageal reflux disease)   . Gout   . Hyperkalemia   . Hypertension   . Insulin Dependent Diabetes mellitus   . Sleep apnea   . Thrombocytopenia ([place])   . UTI (lower urinary tract infection) 08/2012   E.coli     Past Surgical History: Past  Surgical History:  Procedure Laterality Date  . APPENDECTOMY    . BACK SURGERY     15 years ago  . CARDIAC CATHETERIZATION  2005   stenting of the vein graft to the posterior lateral per -- Dr. Martinique      . CARDIAC CATHETERIZATION  2011   showed right coronary, totally occluded LAD and severe stenosis in both saphenous vein graft to the posterior lateral and posterior descending  -- stenting of the proximal portion of the SVG to the posterior lateral branch in 2/11  . CHOLECYSTECTOMY    . CORONARY ARTERY BYPASS GRAFT  1997    (LIMA to LAD, SVG to PDA, SVG to PL)  . LEFT AND RIGHT HEART CATHETERIZATION WITH CORONARY/GRAFT ANGIOGRAM  09/04/2012   Procedure: LEFT AND RIGHT HEART CATHETERIZATION WITH Beatrix Fetters;  Surgeon: Sherren Mocha, MD;  Location: [facility] CATH LAB;  patent grafts, mod 2ndary pulmonary HTN, elevated LV EDP & PCWP  . LEFT HEART CATH AND CORS/GRAFTS ANGIOGRAPHY N/A 06/17/2017   Procedure: LEFT HEART CATH AND CORS/GRAFTS ANGIOGRAPHY;  Surgeon: Martinique, Peter M, MD;  Location: [facility] CV LAB;  Service: Cardiovascular;  Laterality: N/A;  . PARTIAL COLECTOMY     cancerous polyps     Family History: Family History  Problem Relation Age of Onset  . Coronary artery disease Father   . Heart attack Father   . Coronary artery disease Brother        1/2 brother   . Kidney disease Sister   . Cardiomyopathy Mother   . Asthma Brother     Social History: Social History   Socioeconomic History  . Marital status: Single    Spouse name: None  . Number of children: 0  . Years of education: None  . Highest education level: None  Social Needs  . Financial resource strain: None  . Food insecurity - worry: None  . Food insecurity - inability: None  . Transportation needs - medical: None  . Transportation needs - non-medical: None  Occupational History  . Occupation: retired   ran a Office manager course  Tobacco Use  . Smoking status: Former Smoker    Packs/day: 1.50     Years: 35.00    Pack years: 52.50    Types: Cigarettes    Last attempt to quit: 10/12/1983    Years since quitting: 33.9  . Smokeless tobacco: Never Used  Substance and Sexual Activity  . Alcohol use: Yes    Alcohol/week: 0.0 oz    Comment: 2-3 drinks 3-4x/wk.  . Drug use: No  . Sexual activity: Not Currently    Birth control/protection: None  Other Topics Concern  . None  Social History Narrative   Lives in [place] by himself.  Active but does not routinely exercise.    Allergies: No Known Allergies  Outpatient Meds: Current Outpatient Medications  Medication Sig Dispense Refill  . allopurinol (  ZYLOPRIM) 100 MG tablet Take 2 tablets (200 mg total) by mouth daily. 60 tablet 1  . aspirin 81 MG EC tablet Take 81 mg by mouth daily.      Marland Kitchen atorvastatin (LIPITOR) 80 MG tablet Take 80 mg by mouth every morning.    . carvedilol (COREG) 12.5 MG tablet Take 1 tablet (12.5 mg total) by mouth 2 (two) times daily with a meal. 60 tablet 6  . colchicine 0.6 MG tablet Take 1 tablet (0.6 mg total) by mouth 3 (three) times daily as needed. For gout pain. NEED OV. (Patient taking differently: Take 0.6-1.8 mg by mouth 3 (three) times daily as needed (for gout pain.). For gout pain. NEED OV.) 60 tablet 6  . Cyanocobalamin (VITAMIN B-12 PO) Take 1 tablet by mouth daily.    . famotidine (PEPCID) 20 MG tablet Take 20 mg by mouth every morning.     . fluticasone (FLONASE) 50 MCG/ACT nasal spray Place 2 sprays into both nostrils daily as needed for allergies or rhinitis.    . furosemide (LASIX) 40 MG tablet Take 0.5 tablets (20 mg total) by mouth 2 (two) times daily. 180 tablet 3  . gabapentin (NEURONTIN) 300 MG capsule Take 300 mg by mouth 2 (two) times daily.    . insulin aspart protamine- aspart (NOVOLOG MIX 70/30) (70-30) 100 UNIT/ML injection Inject 35 Units into the skin See admin instructions. Inject 35 units underskin two time daily before breakfast and dinner.    . isosorbide mononitrate (IMDUR) 30 MG  24 hr tablet Take 1 tablet (30 mg total) by mouth daily. 30 tablet 6  . latanoprost (XALATAN) 0.005 % ophthalmic solution Place 1 drop into both eyes at bedtime.    Marland Kitchen loratadine (CLARITIN) 10 MG tablet Take 10 mg by mouth daily as needed for allergies.    Marland Kitchen losartan (COZAAR) 100 MG tablet Take 100 mg by mouth daily.    . Multiple Vitamin (MULTIVITAMIN) tablet Take 1 tablet by mouth daily.      . nitroGLYCERIN (NITROSTAT) 0.4 MG SL tablet Place 1 tablet (0.4 mg total) under the tongue every 5 (five) minutes as needed for chest pain (up to 3 doses). 25 tablet 3  . pantoprazole (PROTONIX) 40 MG tablet Take 1 tablet (40 mg total) by mouth 2 (two) times daily. 180 tablet 3  . tamsulosin (FLOMAX) 0.4 MG CAPS capsule Take 0.4 mg by mouth daily.     No current facility-administered medications for this visit.       ___________________________________________________________________ Objective   Exam:  BP (!) 158/82   Pulse 60   Ht 6' (1.829 m)   Wt 223 lb 12.8 oz (101.5 kg)   SpO2 95%   BMI 30.35 kg/m    General: this is a(n) elderly man in no acute distress.  He is pleasant and conversational.  Eyes: sclera anicteric, no redness  ENT: oral mucosa moist without lesions, no cervical or supraclavicular lymphadenopathy, good dentition  CV: RRR without murmur, S1/split S2, no JVD, no peripheral edema  Resp: clear to auscultation bilaterally, normal RR and effort noted  GI: soft, no tenderness, with active bowel sounds. No guarding or palpable organomegaly noted. midline large hernia over scar  Skin; warm and dry, no rash or jaundice noted  Neuro: awake, alert and oriented x 3. Normal gross motor function and fluent speech  Labs:  CMP Latest Ref Rng & Units 06/17/2017 06/15/2017 06/09/2017  Glucose 65 - 99 mg/dL 170(H) 152(H) 217(H)  BUN 6 - 20  mg/dL 25(H) 21 42(H)  Creatinine 0.61 - 1.24 mg/dL 1.46(H) 1.32(H) 1.80(H)  Sodium 135 - 145 mmol/L 138 139 139  Potassium 3.5 - 5.1 mmol/L  5.1 5.7(H) 5.7(H)  Chloride 101 - 111 mmol/L 104 101 97  CO2 22 - 32 mmol/L 25 26 27   Calcium 8.9 - 10.3 mg/dL 9.0 9.5 9.9  Total Protein 6.5 - 8.1 g/dL - - -  Total Bilirubin 0.3 - 1.2 mg/dL - - -  Alkaline Phos 38 - 126 U/L - - -  AST 15 - 41 U/L - - -  ALT 17 - 63 U/L - - -   CBC Latest Ref Rng & Units 06/15/2017 06/09/2017 05/21/2017  WBC 3.4 - 10.8 x10E3/uL 8.2 6.9 7.0  Hemoglobin 13.0 - 17.7 g/dL 12.9(L) 14.3 13.8  Hematocrit 37.5 - 51.0 % 41.2 43.6 43.6  Platelets 150 - 379 x10E3/uL 87(LL) 86(LL) 73(L)     Radiologic Studies: PA and lateral chest x-ray done during episode of chest pain 1118, no other imaging of the chest or abdomen performed.  Assessment: Encounter Diagnoses  Name Primary?  . Abdominal pain, chronic, epigastric Yes  . Chronic combined systolic and diastolic heart failure ([place])   . Arteriosclerotic coronary artery disease   . Diabetes mellitus, type II, insulin dependent ([place])   . CKD (chronic kidney disease) stage 3, GFR 30-59 ml/min ([facility])     Epigastric pain that is radiating along bilateral lower rib cage, brief episodes that are nonexertional and difficult to characterize.  He does not feel they are typical of his previous reflux symptoms.  He is also been on chronic PPI for many years.  It is not yet clear if this pain is digestive in nature, but I think it warrants an upper endoscopy to try and understand it better.  He is agreeable after a thorough discussion of procedure and risks.  The benefits and risks of the planned procedure were described in detail with the patient or (when appropriate) their health care proxy.  Risks were outlined as including, but not limited to, bleeding, infection, perforation, adverse medication reaction leading to cardiac or pulmonary decompensation, or pancreatitis (if ERCP).  The limitation of incomplete mucosal visualization was also discussed.  No guarantees or warranties were given.  The procedure will be done at the  hospital endoscopy lab due to the increased risk from his medical comorbidities, especially congestive heart failure.  I also feel he can discontinue famotidine.  Thank you for the courtesy of this consult.  Please call me with any questions or concerns.  Nelida Meuse III  CC: Reubin Milan, MD  Peter Martinique, MD

## 2017-08-31 NOTE — Patient Instructions (Addendum)
If you are age 81 or older, your body mass index should be between 23-30. Your Body mass index is 30.35 kg/m. If this is out of the aforementioned range listed, please consider follow up with your Primary Care Provider.  If you are age 23 or younger, your body mass index should be between 19-25. Your Body mass index is 30.35 kg/m. If this is out of the aformentioned range listed, please consider follow up with your Primary Care Provider.   You have been scheduled for an endoscopy. Please follow written instructions given to you at your visit today. If you use inhalers (even only as needed), please bring them with you on the day of your procedure. Your physician has requested that you go to www.startemmi.com and enter the access code given to you at your visit today. This web site gives a general overview about your procedure. However, you should still follow specific instructions given to you by our office regarding your preparation for the procedure.  Stop taking famotidine.  Thank you for choosing [facility]  Dr Wilfrid Lund III

## 2017-09-14 ENCOUNTER — Encounter (HOSPITAL_COMMUNITY): Payer: Self-pay | Admitting: *Deleted

## 2017-09-14 ENCOUNTER — Other Ambulatory Visit: Payer: Self-pay

## 2017-09-21 ENCOUNTER — Ambulatory Visit (HOSPITAL_COMMUNITY)
Admission: RE | Admit: 2017-09-21 | Discharge: 2017-09-21 | Disposition: A | Discharge status: Home / Self Care | Payer: Medicare HMO | Source: Ambulatory Visit | Attending: Gastroenterology | Admitting: Gastroenterology

## 2017-09-21 ENCOUNTER — Encounter (HOSPITAL_COMMUNITY)
Admission: RE | Disposition: A | Discharge status: Home / Self Care | Payer: Self-pay | Source: Ambulatory Visit | Attending: Gastroenterology

## 2017-09-21 ENCOUNTER — Ambulatory Visit (HOSPITAL_COMMUNITY): Payer: Medicare HMO | Admitting: Anesthesiology

## 2017-09-21 ENCOUNTER — Other Ambulatory Visit: Payer: Self-pay

## 2017-09-21 ENCOUNTER — Encounter (HOSPITAL_COMMUNITY): Payer: Self-pay | Admitting: *Deleted

## 2017-09-21 DIAGNOSIS — M109 Gout, unspecified: Secondary | ICD-10-CM | POA: Insufficient documentation

## 2017-09-21 DIAGNOSIS — Z794 Long term (current) use of insulin: Secondary | ICD-10-CM | POA: Insufficient documentation

## 2017-09-21 DIAGNOSIS — K219 Gastro-esophageal reflux disease without esophagitis: Secondary | ICD-10-CM | POA: Diagnosis not present

## 2017-09-21 DIAGNOSIS — I2510 Atherosclerotic heart disease of native coronary artery without angina pectoris: Secondary | ICD-10-CM | POA: Insufficient documentation

## 2017-09-21 DIAGNOSIS — R110 Nausea: Secondary | ICD-10-CM | POA: Diagnosis not present

## 2017-09-21 DIAGNOSIS — Z951 Presence of aortocoronary bypass graft: Secondary | ICD-10-CM | POA: Diagnosis not present

## 2017-09-21 DIAGNOSIS — G4730 Sleep apnea, unspecified: Secondary | ICD-10-CM | POA: Insufficient documentation

## 2017-09-21 DIAGNOSIS — R079 Chest pain, unspecified: Secondary | ICD-10-CM

## 2017-09-21 DIAGNOSIS — Z79899 Other long term (current) drug therapy: Secondary | ICD-10-CM | POA: Insufficient documentation

## 2017-09-21 DIAGNOSIS — Z87891 Personal history of nicotine dependence: Secondary | ICD-10-CM | POA: Diagnosis not present

## 2017-09-21 DIAGNOSIS — R0789 Other chest pain: Secondary | ICD-10-CM | POA: Insufficient documentation

## 2017-09-21 DIAGNOSIS — M1990 Unspecified osteoarthritis, unspecified site: Secondary | ICD-10-CM | POA: Diagnosis not present

## 2017-09-21 DIAGNOSIS — N183 Chronic kidney disease, stage 3 unspecified: Secondary | ICD-10-CM

## 2017-09-21 DIAGNOSIS — G8929 Other chronic pain: Secondary | ICD-10-CM

## 2017-09-21 DIAGNOSIS — R1013 Epigastric pain: Secondary | ICD-10-CM | POA: Diagnosis not present

## 2017-09-21 DIAGNOSIS — Z7982 Long term (current) use of aspirin: Secondary | ICD-10-CM | POA: Diagnosis not present

## 2017-09-21 DIAGNOSIS — I10 Essential (primary) hypertension: Secondary | ICD-10-CM | POA: Diagnosis not present

## 2017-09-21 DIAGNOSIS — I5042 Chronic combined systolic (congestive) and diastolic (congestive) heart failure: Secondary | ICD-10-CM | POA: Diagnosis not present

## 2017-09-21 DIAGNOSIS — I130 Hypertensive heart and chronic kidney disease with heart failure and stage 1 through stage 4 chronic kidney disease, or unspecified chronic kidney disease: Secondary | ICD-10-CM | POA: Diagnosis not present

## 2017-09-21 DIAGNOSIS — E119 Type 2 diabetes mellitus without complications: Secondary | ICD-10-CM

## 2017-09-21 DIAGNOSIS — E785 Hyperlipidemia, unspecified: Secondary | ICD-10-CM | POA: Diagnosis not present

## 2017-09-21 DIAGNOSIS — D696 Thrombocytopenia, unspecified: Secondary | ICD-10-CM | POA: Insufficient documentation

## 2017-09-21 HISTORY — DX: Dependence on supplemental oxygen: Z99.81

## 2017-09-21 HISTORY — DX: Malignant (primary) neoplasm, unspecified: C80.1

## 2017-09-21 HISTORY — PX: ESOPHAGOGASTRODUODENOSCOPY (EGD) WITH PROPOFOL: SHX5813

## 2017-09-21 LAB — GLUCOSE, CAPILLARY: Glucose-Capillary: 245 mg/dL — ABNORMAL HIGH (ref 65–99)

## 2017-09-21 SURGERY — ESOPHAGOGASTRODUODENOSCOPY (EGD) WITH PROPOFOL
Anesthesia: Monitor Anesthesia Care

## 2017-09-21 MED ORDER — PROPOFOL 10 MG/ML IV BOLUS
INTRAVENOUS | Status: DC | PRN
  Administered 2017-09-21 (×2): 20 mg via INTRAVENOUS

## 2017-09-21 MED ORDER — LACTATED RINGERS IV SOLN
INTRAVENOUS | Status: DC
  Administered 2017-09-21: 11:00:00 via INTRAVENOUS

## 2017-09-21 MED ORDER — PROPOFOL 500 MG/50ML IV EMUL
INTRAVENOUS | Status: DC | PRN
  Administered 2017-09-21: 140 ug/kg/min via INTRAVENOUS

## 2017-09-21 MED ORDER — LIDOCAINE 2% (20 MG/ML) 5 ML SYRINGE
INTRAMUSCULAR | Status: DC | PRN
  Administered 2017-09-21: 100 mg via INTRAVENOUS

## 2017-09-21 MED ORDER — PROPOFOL 10 MG/ML IV BOLUS
INTRAVENOUS | Status: AC
  Filled 2017-09-21: qty 40

## 2017-09-21 MED ORDER — SODIUM CHLORIDE 0.9 % IV SOLN: INTRAVENOUS | Status: DC

## 2017-09-21 SURGICAL SUPPLY — 14 items

## 2017-09-21 NOTE — Anesthesia Postprocedure Evaluation (Signed)
Anesthesia Post Note  Patient: Scott [name]  Procedure(s) Performed: ESOPHAGOGASTRODUODENOSCOPY (EGD) WITH PROPOFOL (N/A )     Patient location during evaluation: PACU Anesthesia Type: MAC Level of consciousness: awake and alert Pain management: pain level controlled Vital Signs Assessment: post-procedure vital signs reviewed and stable Respiratory status: spontaneous breathing, nonlabored ventilation, respiratory function stable and patient connected to nasal cannula oxygen Cardiovascular status: stable and blood pressure returned to baseline Postop Assessment: no apparent nausea or vomiting Anesthetic complications: no    Last Vitals:  Vitals:   09/21/17 1300 09/21/17 1310  BP: (!) 149/77   Pulse: 60 65  Resp: 15 (!) 22  Temp:    SpO2: 96% 95%    Last Pain:  Vitals:   09/21/17 1245  TempSrc: Oral                 Cyril Woodmansee

## 2017-09-21 NOTE — Anesthesia Procedure Notes (Signed)
Performed by: Tyliah Schlereth L, CRNA Oxygen Delivery Method: Nasal cannula       

## 2017-09-21 NOTE — Transfer of Care (Signed)
Immediate Anesthesia Transfer of Care Note  Patient: Scott [name]  Procedure(s) Performed: ESOPHAGOGASTRODUODENOSCOPY (EGD) WITH PROPOFOL (N/A )  Patient Location: Endoscopy Unit  Anesthesia Type:MAC  Level of Consciousness: awake  Airway & Oxygen Therapy: Patient Spontanous Breathing and Patient connected to nasal cannula oxygen  Post-op Assessment: Report given to RN and Post -op Vital signs reviewed and stable  Post vital signs: Reviewed and stable  Last Vitals:  Vitals:   09/21/17 1115  BP: (!) 167/68  Pulse: 70  Resp: 13  Temp: 36.7 C  SpO2: 97%    Last Pain:  Vitals:   09/21/17 1115  TempSrc: Oral         Complications: No apparent anesthesia complications

## 2017-09-21 NOTE — Op Note (Signed)
[facility] Patient Name: Scott [name] Procedure Date: 09/21/2017 MRN: [id number] Attending MD: Estill Cotta. Loletha Carrow , MD Date of Birth: [birth year] CSN: [id number] Age: 83 Admit Type: Outpatient Procedure:                Upper GI endoscopy Indications:              Unexplained chest pain Providers:                Mallie Mussel L. Loletha Carrow, MD, Cleda Daub, RN, William Dalton, Technician Referring MD:             Peter Martinique, MD Medicines:                Monitored Anesthesia Care Complications:            No immediate complications. Estimated Blood Loss:     Estimated blood loss: none. Procedure:                Pre-Anesthesia Assessment:                           - Prior to the procedure, a History and Physical                            was performed, and patient medications and                            allergies were reviewed. The patient's tolerance of                            previous anesthesia was also reviewed. The risks                            and benefits of the procedure and the sedation                            options and risks were discussed with the patient.                            All questions were answered, and informed consent                            was obtained. Anticoagulants: The patient has taken                            aspirin. It was decided not to withhold this                            medication prior to the procedure. ASA Grade                            Assessment: III - A patient with severe systemic  disease. After reviewing the risks and benefits,                            the patient was deemed in satisfactory condition to                            undergo the procedure.                           After obtaining informed consent, the endoscope was                            passed under direct vision. Throughout the                            procedure, the patient's blood  pressure, pulse, and                            oxygen saturations were monitored continuously. The                            Endoscope was introduced through the mouth, and                            advanced to the second part of duodenum. The upper                            GI endoscopy was accomplished without difficulty.                            The patient tolerated the procedure well. Scope In: Scope Out: Findings:      The esophagus was normal.      The stomach was normal.      The cardia and gastric fundus were normal on retroflexion.      The examined duodenum was normal. Impression:               - Normal esophagus.                           - Normal stomach.                           - Normal examined duodenum.                           - No specimens collected.                           No visible cause for this chest pain, which the                            patient reports is different than his previous acid                            reflux symptoms. Moderate Sedation:  MAC sedation used Recommendation:           - Patient has a contact number available for                            emergencies. The signs and symptoms of potential                            delayed complications were discussed with the                            patient. Return to normal activities tomorrow.                            Written discharge instructions were provided to the                            patient.                           - Resume previous diet.                           - Continue present medications.                           - Return to primary care physician as previously                            scheduled. Procedure Code(s):        --- Professional ---                           [phone], Esophagogastroduodenoscopy, flexible,                            transoral; diagnostic, including collection of                            specimen(s) by brushing or washing,  when performed                            (separate procedure) Diagnosis Code(s):        --- Professional ---                           R07.9, Chest pain, unspecified CPT copyright 2016 American Medical Association. All rights reserved. The codes documented in this report are preliminary and upon coder review may  be revised to meet current compliance requirements. Demarri Elie L. Loletha Carrow, MD 09/21/2017 12:44:43 PM This report has been signed electronically. Number of Addenda: 0

## 2017-09-21 NOTE — Interval H&P Note (Signed)
History and Physical Interval Note:  09/21/2017 12:27 PM  Scott [name]  has presented today for surgery, with the diagnosis of epigastric pain  The various methods of treatment have been discussed with the patient and family. After consideration of risks, benefits and other options for treatment, the patient has consented to  Procedure(s): ESOPHAGOGASTRODUODENOSCOPY (EGD) WITH PROPOFOL (N/A) as a surgical intervention .  The patient's history has been reviewed, patient examined, no change in status, stable for surgery.  I have reviewed the patient's chart and labs.  Questions were answered to the patient's satisfaction.     Nelida Meuse III

## 2017-09-21 NOTE — Discharge Instructions (Signed)

## 2017-09-21 NOTE — Anesthesia Preprocedure Evaluation (Signed)
Anesthesia Evaluation  Patient identified by MRN, date of birth, ID band Patient awake    Reviewed: Allergy & Precautions, NPO status , Patient's Chart, lab work & pertinent test results, reviewed documented beta blocker date and time   History of Anesthesia Complications Negative for: history of anesthetic complications  Airway Mallampati: II  TM Distance: >3 FB Neck ROM: Full    Dental  (+) Upper Dentures, Missing,    Pulmonary sleep apnea , neg COPD, former smoker,    breath sounds clear to auscultation- rhonchi       Cardiovascular hypertension, Pt. on medications and Pt. on home beta blockers (-) angina+ CAD, + Past MI, + CABG and +CHF   Rhythm:Regular     Neuro/Psych negative neurological ROS  negative psych ROS   GI/Hepatic GERD  Medicated and Controlled,  Endo/Other  diabetes, Type 2, Insulin Dependent  Renal/GU Renal InsufficiencyRenal disease     Musculoskeletal  (+) Arthritis ,   Abdominal   Peds  Hematology negative hematology ROS (+)   Anesthesia Other Findings   Reproductive/Obstetrics                             Anesthesia Physical Anesthesia Plan  ASA: III  Anesthesia Plan: MAC   Post-op Pain Management:    Induction: Intravenous  PONV Risk Score and Plan: 1 and Treatment may vary due to age or medical condition  Airway Management Planned: Nasal Cannula  Additional Equipment: None  Intra-op Plan:   Post-operative Plan:   Informed Consent: I have reviewed the patients History and Physical, chart, labs and discussed the procedure including the risks, benefits and alternatives for the proposed anesthesia with the patient or authorized representative who has indicated his/her understanding and acceptance.   Dental advisory given  Plan Discussed with: CRNA and Surgeon  Anesthesia Plan Comments:         Anesthesia Quick Evaluation

## 2017-09-22 ENCOUNTER — Encounter (HOSPITAL_COMMUNITY): Payer: Self-pay | Admitting: Gastroenterology

## 2017-10-11 DIAGNOSIS — K851 Biliary acute pancreatitis without necrosis or infection: Secondary | ICD-10-CM

## 2017-10-11 HISTORY — DX: Biliary acute pancreatitis without necrosis or infection: K85.10

## 2017-10-15 ENCOUNTER — Inpatient Hospital Stay (HOSPITAL_COMMUNITY)
Admission: EM | Admit: 2017-10-15 | Discharge: 2017-10-18 | DRG: 439 | Disposition: A | Discharge status: Home / Self Care | Payer: Medicare HMO | Attending: Internal Medicine | Admitting: Internal Medicine

## 2017-10-15 ENCOUNTER — Other Ambulatory Visit: Payer: Self-pay

## 2017-10-15 ENCOUNTER — Encounter (HOSPITAL_COMMUNITY): Payer: Self-pay | Admitting: *Deleted

## 2017-10-15 ENCOUNTER — Emergency Department (HOSPITAL_COMMUNITY): Payer: Medicare HMO

## 2017-10-15 DIAGNOSIS — Z87891 Personal history of nicotine dependence: Secondary | ICD-10-CM | POA: Diagnosis not present

## 2017-10-15 DIAGNOSIS — E669 Obesity, unspecified: Secondary | ICD-10-CM | POA: Diagnosis present

## 2017-10-15 DIAGNOSIS — I130 Hypertensive heart and chronic kidney disease with heart failure and stage 1 through stage 4 chronic kidney disease, or unspecified chronic kidney disease: Secondary | ICD-10-CM | POA: Diagnosis present

## 2017-10-15 DIAGNOSIS — I252 Old myocardial infarction: Secondary | ICD-10-CM

## 2017-10-15 DIAGNOSIS — I5042 Chronic combined systolic (congestive) and diastolic (congestive) heart failure: Secondary | ICD-10-CM | POA: Diagnosis present

## 2017-10-15 DIAGNOSIS — R740 Nonspecific elevation of levels of transaminase and lactic acid dehydrogenase [LDH]: Secondary | ICD-10-CM | POA: Diagnosis not present

## 2017-10-15 DIAGNOSIS — Z8249 Family history of ischemic heart disease and other diseases of the circulatory system: Secondary | ICD-10-CM

## 2017-10-15 DIAGNOSIS — Z794 Long term (current) use of insulin: Secondary | ICD-10-CM | POA: Diagnosis not present

## 2017-10-15 DIAGNOSIS — N183 Chronic kidney disease, stage 3 unspecified: Secondary | ICD-10-CM | POA: Diagnosis present

## 2017-10-15 DIAGNOSIS — I2510 Atherosclerotic heart disease of native coronary artery without angina pectoris: Secondary | ICD-10-CM | POA: Diagnosis present

## 2017-10-15 DIAGNOSIS — E119 Type 2 diabetes mellitus without complications: Secondary | ICD-10-CM

## 2017-10-15 DIAGNOSIS — Z85038 Personal history of other malignant neoplasm of large intestine: Secondary | ICD-10-CM

## 2017-10-15 DIAGNOSIS — E1142 Type 2 diabetes mellitus with diabetic polyneuropathy: Secondary | ICD-10-CM | POA: Diagnosis present

## 2017-10-15 DIAGNOSIS — K8050 Calculus of bile duct without cholangitis or cholecystitis without obstruction: Secondary | ICD-10-CM | POA: Diagnosis present

## 2017-10-15 DIAGNOSIS — Z841 Family history of disorders of kidney and ureter: Secondary | ICD-10-CM

## 2017-10-15 DIAGNOSIS — D696 Thrombocytopenia, unspecified: Secondary | ICD-10-CM | POA: Diagnosis present

## 2017-10-15 DIAGNOSIS — R932 Abnormal findings on diagnostic imaging of liver and biliary tract: Secondary | ICD-10-CM | POA: Diagnosis not present

## 2017-10-15 DIAGNOSIS — E785 Hyperlipidemia, unspecified: Secondary | ICD-10-CM | POA: Diagnosis present

## 2017-10-15 DIAGNOSIS — Z9049 Acquired absence of other specified parts of digestive tract: Secondary | ICD-10-CM

## 2017-10-15 DIAGNOSIS — Z951 Presence of aortocoronary bypass graft: Secondary | ICD-10-CM

## 2017-10-15 DIAGNOSIS — Z6829 Body mass index (BMI) 29.0-29.9, adult: Secondary | ICD-10-CM

## 2017-10-15 DIAGNOSIS — K219 Gastro-esophageal reflux disease without esophagitis: Secondary | ICD-10-CM | POA: Diagnosis present

## 2017-10-15 DIAGNOSIS — K838 Other specified diseases of biliary tract: Secondary | ICD-10-CM | POA: Diagnosis not present

## 2017-10-15 DIAGNOSIS — Z79899 Other long term (current) drug therapy: Secondary | ICD-10-CM

## 2017-10-15 DIAGNOSIS — E1136 Type 2 diabetes mellitus with diabetic cataract: Secondary | ICD-10-CM | POA: Diagnosis present

## 2017-10-15 DIAGNOSIS — K839 Disease of biliary tract, unspecified: Secondary | ICD-10-CM | POA: Diagnosis not present

## 2017-10-15 DIAGNOSIS — K439 Ventral hernia without obstruction or gangrene: Secondary | ICD-10-CM | POA: Diagnosis present

## 2017-10-15 DIAGNOSIS — K851 Biliary acute pancreatitis without necrosis or infection: Secondary | ICD-10-CM | POA: Diagnosis present

## 2017-10-15 DIAGNOSIS — M109 Gout, unspecified: Secondary | ICD-10-CM | POA: Diagnosis present

## 2017-10-15 DIAGNOSIS — Z9861 Coronary angioplasty status: Secondary | ICD-10-CM

## 2017-10-15 DIAGNOSIS — Z825 Family history of asthma and other chronic lower respiratory diseases: Secondary | ICD-10-CM | POA: Diagnosis not present

## 2017-10-15 DIAGNOSIS — E1122 Type 2 diabetes mellitus with diabetic chronic kidney disease: Secondary | ICD-10-CM | POA: Diagnosis present

## 2017-10-15 DIAGNOSIS — N179 Acute kidney failure, unspecified: Secondary | ICD-10-CM | POA: Diagnosis present

## 2017-10-15 DIAGNOSIS — I10 Essential (primary) hypertension: Secondary | ICD-10-CM | POA: Diagnosis not present

## 2017-10-15 DIAGNOSIS — G4733 Obstructive sleep apnea (adult) (pediatric): Secondary | ICD-10-CM | POA: Diagnosis present

## 2017-10-15 DIAGNOSIS — R109 Unspecified abdominal pain: Secondary | ICD-10-CM | POA: Diagnosis not present

## 2017-10-15 DIAGNOSIS — M1990 Unspecified osteoarthritis, unspecified site: Secondary | ICD-10-CM | POA: Diagnosis present

## 2017-10-15 DIAGNOSIS — I5043 Acute on chronic combined systolic (congestive) and diastolic (congestive) heart failure: Secondary | ICD-10-CM | POA: Diagnosis present

## 2017-10-15 DIAGNOSIS — R1013 Epigastric pain: Secondary | ICD-10-CM | POA: Diagnosis not present

## 2017-10-15 DIAGNOSIS — K859 Acute pancreatitis without necrosis or infection, unspecified: Secondary | ICD-10-CM | POA: Diagnosis not present

## 2017-10-15 DIAGNOSIS — Z7982 Long term (current) use of aspirin: Secondary | ICD-10-CM

## 2017-10-15 DIAGNOSIS — R05 Cough: Secondary | ICD-10-CM | POA: Diagnosis present

## 2017-10-15 DIAGNOSIS — N281 Cyst of kidney, acquired: Secondary | ICD-10-CM | POA: Diagnosis not present

## 2017-10-15 DIAGNOSIS — I440 Atrioventricular block, first degree: Secondary | ICD-10-CM | POA: Diagnosis present

## 2017-10-15 HISTORY — DX: Biliary acute pancreatitis without necrosis or infection: K85.10

## 2017-10-15 LAB — CBC
HCT: 43.2 % (ref 39.0–52.0)
Hemoglobin: 13.7 g/dL (ref 13.0–17.0)
MCH: 29.7 pg (ref 26.0–34.0)
MCHC: 31.7 g/dL (ref 30.0–36.0)
MCV: 93.7 fL (ref 78.0–100.0)
Platelets: 102 10*3/uL — ABNORMAL LOW (ref 150–400)
RBC: 4.61 MIL/uL (ref 4.22–5.81)
RDW: 14.7 % (ref 11.5–15.5)
WBC: 10.9 10*3/uL — ABNORMAL HIGH (ref 4.0–10.5)

## 2017-10-15 LAB — I-STAT TROPONIN, ED: Troponin i, poc: 0 ng/mL (ref 0.00–0.08)

## 2017-10-15 LAB — COMPREHENSIVE METABOLIC PANEL
ALT: 188 U/L — ABNORMAL HIGH (ref 17–63)
AST: 255 U/L — ABNORMAL HIGH (ref 15–41)
Albumin: 4.1 g/dL (ref 3.5–5.0)
Alkaline Phosphatase: 185 U/L — ABNORMAL HIGH (ref 38–126)
Anion gap: 9 (ref 5–15)
BUN: 34 mg/dL — ABNORMAL HIGH (ref 6–20)
CO2: 28 mmol/L (ref 22–32)
Calcium: 9.4 mg/dL (ref 8.9–10.3)
Chloride: 97 mmol/L — ABNORMAL LOW (ref 101–111)
Creatinine, Ser: 1.84 mg/dL — ABNORMAL HIGH (ref 0.61–1.24)
GFR calc Af Amer: 38 mL/min — ABNORMAL LOW (ref >60)
GFR calc non Af Amer: 32 mL/min — ABNORMAL LOW (ref >60)
Glucose, Bld: 234 mg/dL — ABNORMAL HIGH (ref 65–99)
Potassium: 4.8 mmol/L (ref 3.5–5.1)
Sodium: 134 mmol/L — ABNORMAL LOW (ref 135–145)
Total Bilirubin: 2.3 mg/dL — ABNORMAL HIGH (ref 0.3–1.2)
Total Protein: 7.7 g/dL (ref 6.5–8.1)

## 2017-10-15 LAB — LIPASE, BLOOD: Lipase: >10000 U/L — ABNORMAL HIGH (ref 11–51)

## 2017-10-15 MED ORDER — HYDROMORPHONE HCL 1 MG/ML IJ SOLN
0.5000 mg | Freq: Once | INTRAMUSCULAR | Status: AC
  Administered 2017-10-15: 0.5 mg via INTRAVENOUS
  Filled 2017-10-15: qty 1

## 2017-10-15 MED ORDER — ONDANSETRON HCL 4 MG/2ML IJ SOLN
4.0000 mg | Freq: Once | INTRAMUSCULAR | Status: AC
  Administered 2017-10-15: 4 mg via INTRAVENOUS
  Filled 2017-10-15: qty 2

## 2017-10-15 MED ORDER — SODIUM CHLORIDE 0.9 % IV BOLUS (SEPSIS)
2000.0000 mL | Freq: Once | INTRAVENOUS | Status: AC
  Administered 2017-10-15: 2000 mL via INTRAVENOUS

## 2017-10-15 MED ORDER — IOPAMIDOL (ISOVUE-300) INJECTION 61%
INTRAVENOUS | Status: AC
  Administered 2017-10-15: 75 mL
  Filled 2017-10-15: qty 75

## 2017-10-15 NOTE — ED Provider Notes (Signed)
[facility] EMERGENCY DEPARTMENT Provider Note   CSN: [id number] Arrival date & time: 10/15/17  1738     History   Chief Complaint Chief Complaint  Patient presents with  . Abdominal Pain    HPI Scott [name] is a 84 y.o. male.  The history is provided by the patient and medical records.  Abdominal Pain   This is a new problem. The current episode started 6 to 12 hours ago. The problem occurs constantly. The problem has not changed since onset.Associated with: started an hour after eating. The pain is located in the epigastric region. Quality: unable to specify. The pain is at a severity of 5/10 (but increases to 8-9 intermittently). Associated symptoms include nausea. Pertinent negatives include fever, vomiting, dysuria, headaches and arthralgias. The symptoms are aggravated by palpation. Nothing relieves the symptoms. Past workup comments: none. Past medical history comments: cholecystectomy.    Past Medical History:  Diagnosis Date  . Arthritis   . CAD (coronary artery disease)    a. 1997 CABGx3 (VG->RPDA, VG->PLV, LIMA->LAD);  b. 07/2004 PCI VG->PLV (3.5x16 Taxus DES, 3.5x12 Taxus DES); c. 11/2009 PCI: VG->PLV 3.5x12 Promus DES), VG->RPDA (3.5x15 Promus DES); d. 08/2012 Cath: patent grafts; e. 05/2013 Lexiscan MV: no ischemia/infarction, EF 44%.  . Cancer [facility]) 2005   colon surgery done  . Cataract   . Chronic combined systolic and diastolic CHF (congestive heart failure) ([place]) 08/2012   a. EF 50-55% 11/2011; b. 08/2012 - EF 25-30%,  with mild RM, mod TR, mod RAE & LAE, moderate reduced RV systolic function and PASP of 60mmHg. - > cath showed patent grafts & Mod Puml HTN wth elevated PCWP;  c. 01/2014 Echo: EF 35-40%, mod LVH, mod HK, mildly dil LA, mod dil RA, PASP 15mmHg.  . CKD (chronic kidney disease), stage III ([place])   . Dyslipidemia   . GERD (gastroesophageal reflux disease)   . Gout   . History of home oxygen therapy    uses oxygen with cpap low level not  sure how many liters  . Hyperkalemia   . Hypertension   . Insulin Dependent Diabetes mellitus    type 2  . Myocardial infarction ([place]) 1997  . Sleep apnea   . Thrombocytopenia ([place])   . UTI (lower urinary tract infection) 08/2012   E.coli    Patient Active Problem List   Diagnosis Date Noted  . Midsternal chest pain 05/21/2017  . Nausea - without vomiting 01/01/2015  . Low grade fever 01/01/2015  . DOE (dyspnea on exertion) 01/01/2015  . Unstable angina ([place]) 05/12/2013  . Chronic combined systolic and diastolic heart failure ([place]) 05/12/2013  . CKD (chronic kidney disease) stage 3, GFR 30-59 ml/min ([facility]) 09/01/2012  . OSA (obstructive sleep apnea) 07/04/2012  . Dyspnea 11/05/2011  . Diabetes mellitus, type II, insulin dependent ([place]) 04/07/2011  . Hypertension   . Ischemic cardiomyopathy   . Myocardial infarction ([place])   . Dyslipidemia   . Arteriosclerotic coronary artery disease     Past Surgical History:  Procedure Laterality Date  . APPENDECTOMY    . BACK SURGERY     15 years ago lower  . CARDIAC CATHETERIZATION  2005   stenting of the vein graft to the posterior lateral per -- Dr. Martinique      . CARDIAC CATHETERIZATION  2011   showed right coronary, totally occluded LAD and severe stenosis in both saphenous vein graft to the posterior lateral and posterior descending  -- stenting of the proximal  portion of the SVG to the posterior lateral branch in 2/11  . CHOLECYSTECTOMY    . CORONARY ARTERY BYPASS GRAFT  1997    (LIMA to LAD, SVG to PDA, SVG to PL)  . ESOPHAGOGASTRODUODENOSCOPY (EGD) WITH PROPOFOL N/A 09/21/2017   Procedure: ESOPHAGOGASTRODUODENOSCOPY (EGD) WITH PROPOFOL;  Surgeon: Doran Stabler, MD;  Location: [facility] ENDOSCOPY;  Service: Gastroenterology;  Laterality: N/A;  . EYE SURGERY     ioc for cataract  . HERNIA REPAIR    . LEFT AND RIGHT HEART CATHETERIZATION WITH CORONARY/GRAFT ANGIOGRAM  09/04/2012   Procedure: LEFT AND RIGHT HEART CATHETERIZATION  WITH Beatrix Fetters;  Surgeon: Sherren Mocha, MD;  Location: [facility] CATH LAB;  patent grafts, mod 2ndary pulmonary HTN, elevated LV EDP & PCWP  . LEFT HEART CATH AND CORS/GRAFTS ANGIOGRAPHY N/A 06/17/2017   Procedure: LEFT HEART CATH AND CORS/GRAFTS ANGIOGRAPHY;  Surgeon: Martinique, Peter M, MD;  Location: [facility] CV LAB;  Service: Cardiovascular;  Laterality: N/A;  . PARTIAL COLECTOMY     cancerous polyps  . stent to heart  last done 5 to 6 yrs ago   x 4       Home Medications    Prior to Admission medications   Medication Sig Start Date End Date Taking? Authorizing Provider  allopurinol (ZYLOPRIM) 100 MG tablet Take 2 tablets (200 mg total) by mouth daily. Patient taking differently: Take 100 mg by mouth 2 (two) times daily.  09/05/12   Hope, Jessica A, PA-C  aspirin 81 MG EC tablet Take 81 mg by mouth daily.      [provider]  atorvastatin (LIPITOR) 80 MG tablet Take 80 mg by mouth every morning.    [provider]  bismuth subsalicylate (PEPTO BISMOL) 262 MG/15ML suspension Take 30 mLs by mouth every 6 (six) hours as needed for indigestion.    [provider]  carvedilol (COREG) 12.5 MG tablet Take 1 tablet (12.5 mg total) by mouth 2 (two) times daily with a meal. 09/05/12   Hope, Jessica A, PA-C  cholecalciferol (VITAMIN D) 1000 units tablet Take 1,000 Units by mouth daily.    [provider]  colchicine 0.6 MG tablet Take 1 tablet (0.6 mg total) by mouth 3 (three) times daily as needed. For gout pain. NEED OV. Patient taking differently: Take 0.6-1.8 mg by mouth 3 (three) times daily as needed (for gout pain.). For gout pain. NEED OV. 03/04/17   Martinique, Peter M, MD  fluticasone [facility]) 50 MCG/ACT nasal spray Place 2 sprays into both nostrils daily as needed for allergies or rhinitis.    [provider]  furosemide (LASIX) 80 MG tablet Take 40 mg by mouth 2 (two) times daily.    [provider]  gabapentin (NEURONTIN) 300  MG capsule Take 300 mg by mouth 2 (two) times daily.     [provider]  insulin aspart protamine- aspart (NOVOLOG MIX 70/30) (70-30) 100 UNIT/ML injection Inject 35 Units into the skin 2 (two) times daily.     [provider]  isosorbide mononitrate (IMDUR) 30 MG 24 hr tablet Take 1 tablet (30 mg total) by mouth daily. Patient not taking: Reported on 09/15/2017 05/22/17 05/22/18  Theora Gianotti, NP  latanoprost (XALATAN) 0.005 % ophthalmic solution Place 1 drop into both eyes at bedtime.    [provider]  losartan (COZAAR) 100 MG tablet Take 100 mg by mouth daily.    [provider]  Multiple Vitamin (MULTIVITAMIN) tablet Take 1 tablet by  mouth daily.      [provider]  nitroGLYCERIN (NITROSTAT) 0.4 MG SL tablet Place 1 tablet (0.4 mg total) under the tongue every 5 (five) minutes as needed for chest pain (up to 3 doses). 02/20/15   Martinique, Peter M, MD  pantoprazole (PROTONIX) 40 MG tablet Take 1 tablet (40 mg total) by mouth 2 (two) times daily. 06/09/17   Almyra Deforest, PA  tamsulosin (FLOMAX) 0.4 MG CAPS capsule Take 0.4 mg by mouth every evening.     [provider]    Family History Family History  Problem Relation Age of Onset  . Coronary artery disease Father   . Heart attack Father   . Coronary artery disease Brother        1/2 brother   . Kidney disease Sister   . Cardiomyopathy Mother   . Asthma Brother     Social History Social History   Tobacco Use  . Smoking status: Former Smoker    Packs/day: 1.50    Years: 35.00    Pack years: 52.50    Types: Cigarettes    Last attempt to quit: 10/12/1983    Years since quitting: 34.0  . Smokeless tobacco: Never Used  Substance Use Topics  . Alcohol use: Yes    Alcohol/week: 0.0 oz    Comment: 2-3 drinks 3-4x/wk.  . Drug use: No     Allergies   Patient has no known allergies.   Review of Systems Review of Systems  Constitutional: Positive for activity change.  Negative for chills and fever.  HENT: Negative for sore throat and trouble swallowing.   Eyes: Negative for visual disturbance.  Respiratory: Negative for cough and shortness of breath.   Cardiovascular: Negative for chest pain.  Gastrointestinal: Positive for abdominal pain and nausea. Negative for vomiting.  Genitourinary: Negative for dysuria.  Musculoskeletal: Negative for arthralgias and back pain.  Skin: Negative for rash.  Allergic/Immunologic: Negative for immunocompromised state.  Neurological: Negative for syncope and headaches.  Psychiatric/Behavioral: Negative for confusion.  All other systems reviewed and are negative.    Physical Exam Updated Vital Signs BP 140/62   Pulse 70   Temp 98 F (36.7 C) (Oral)   Resp (!) 25   Ht 6' (1.829 m)   Wt 99.8 kg (220 lb)   SpO2 92%   BMI 29.84 kg/m   Physical Exam  Constitutional: He is oriented to person, place, and time. He appears well-developed and well-nourished.  HENT:  Head: Normocephalic and atraumatic.  Eyes: Conjunctivae are normal.  Neck: Neck supple.  Cardiovascular: Normal rate and regular rhythm.  No murmur heard. Pulmonary/Chest: Effort normal and breath sounds normal. No respiratory distress.  Abdominal: Soft. There is tenderness in the right upper quadrant, epigastric area and left upper quadrant. There is no rigidity, no rebound, no guarding and negative Murphy's sign.  Musculoskeletal: He exhibits no edema or deformity.  Neurological: He is alert and oriented to person, place, and time.  Skin: Skin is warm and dry.  Psychiatric: He has a normal mood and affect.  Nursing note and vitals reviewed.    ED Treatments / Results  Labs (all labs ordered are listed, but only abnormal results are displayed) Labs Reviewed  LIPASE, BLOOD - Abnormal; Notable for the following components:      Result Value   Lipase >10,000 (*)    All other components within normal limits  COMPREHENSIVE METABOLIC PANEL -  Abnormal; Notable for the following components:   Sodium 134 (*)  Chloride 97 (*)    Glucose, Bld 234 (*)    BUN 34 (*)    Creatinine, Ser 1.84 (*)    AST 255 (*)    ALT 188 (*)    Alkaline Phosphatase 185 (*)    Total Bilirubin 2.3 (*)    GFR calc non Af Amer 32 (*)    GFR calc Af Amer 38 (*)    All other components within normal limits  CBC - Abnormal; Notable for the following components:   WBC 10.9 (*)    Platelets 102 (*)    All other components within normal limits  I-STAT TROPONIN, ED    EKG  EKG Interpretation None       Radiology Dg Chest 2 View  Result Date: 10/15/2017 CLINICAL DATA:  Epigastric pain. EXAM: CHEST  2 VIEW COMPARISON:  05/21/2017 FINDINGS: The cardiopericardial silhouette is within normal limits for size. The lungs are clear without focal pneumonia, edema, pneumothorax or pleural effusion. Interstitial markings are diffusely coarsened with chronic features. The visualized bony structures of the thorax are intact. IMPRESSION: Stable. Chronic interstitial coarsening without acute cardiopulmonary findings. Electronically Signed   By: Misty Stanley M.D.   On: 10/15/2017 18:54   Ct Abdomen Pelvis W Contrast  Result Date: 10/15/2017 CLINICAL DATA:  Epigastric pain. Pancreatitis suspected, atypical presentation/labs EXAM: CT ABDOMEN AND PELVIS WITH CONTRAST TECHNIQUE: Multidetector CT imaging of the abdomen and pelvis was performed using the standard protocol following bolus administration of intravenous contrast. CONTRAST:  95mL ISOVUE-300 IOPAMIDOL (ISOVUE-300) INJECTION 61% COMPARISON:  None available. FINDINGS: Lower chest: Cardiomegaly with coronary artery calcifications. Post median sternotomy. Subpleural reticulation in both lower lobes, mild. No pleural fluid or consolidation. Hepatobiliary: Postcholecystectomy. Intra and extrahepatic biliary ductal dilatation with common bile duct measuring 2 cm at porta hepatis. Possible rounded 10 mm filling defect in  the distal common bile duct at the duodenal insertion. No discrete focal hepatic lesion. Pancreas: Mild parenchymal atrophy. Probable 10 mm pancreatic cyst in the body (image 34 series 3) versus less likely volume averaging. No ductal dilatation or inflammation. Spleen: Normal in size without focal abnormality. Adrenals/Urinary Tract: Normal adrenal glands. Bilateral perinephric edema, symmetric. Multiple small bilateral low-density renal lesions, majority of which are too small to characterize. 17 mm exophytic lesion from the lower left kidney has Hounsfield units of 70 and is consistent with a benign hemorrhagic cyst. No significant excretion on delayed phase imaging suggesting renal dysfunction. Urinary bladder is physiologically distended without wall thickening. Stomach/Bowel: Stomach physiologically distended. No small bowel inflammation, wall thickening or obstruction. Appendix is surgically absent. Enteric sutures in the ascending colon. Umbilical hernia with neck 7.0 cm contains nonobstructed noninflamed transverse colon. No colonic inflammation or wall thickening. No significant diverticular disease. Vascular/Lymphatic: Calcified and noncalcified atheromatous plaque in the abdominal aorta. Focal aneurysmal dilatation is day inferior to the renal arteries measuring up to 3.8 cm. No evidence of aortic rupture. Moderate to advanced iliac atherosclerosis and tortuosity. No acute vascular findings. No enlarged abdominal or pelvic lymph nodes. Reproductive: Prominent prostate gland spanning 5.5 cm with mild mass effect on the bladder base. Other: No ascites.  No intra-abdominal abscess or free air. Musculoskeletal: Multilevel degenerative change in the spine. There are no acute or suspicious osseous abnormalities. IMPRESSION: 1. Post cholecystectomy. Intra and extrahepatic biliary dilatation with a 10 mm filling defect in the distal common bile duct, possible choledocholithiasis. Recommend ERCP. MRCP could be  considered if patient is able tolerate breath hold technique. 2. No evidence pancreatic  inflammation or pancreatic ductal dilatation. Possible 10 mm pancreatic cyst. This is likely incidental, however incompletely characterized. The decision to pursue further imaging should be anchored to patient's overall health and perferences. Recommend follow up pre and post contrast MRI/MRCP or pancreatic protocol CT in 2 years. This recommendation follows ACR consensus guidelines: Management of Incidental Pancreatic Cysts: A White Paper of the ACR Incidental Findings Committee. J Am Coll Radiol 1610;96:045-409. 3. Umbilical hernia containing nonobstructed noninflamed transverse colon. 4. Bilateral renal cysts including a hemorrhagic cyst in the left kidney. 5. Aortic atherosclerosis with focal aneurysmal dilatation of the infrarenal aorta, 3.8 cm. Recommend followup by ultrasound in 2 years. This recommendation follows ACR consensus guidelines: White Paper of the ACR Incidental Findings Committee II on Vascular Findings. J Am Coll Radiol 2013; 10:789-794. Aortic Atherosclerosis (ICD10-I70.0). Aortic aneurysm NOS (ICD10-I71.9). Electronically Signed   By: Jeb Levering M.D.   On: 10/15/2017 22:50    Procedures Procedures (including critical care time)  Medications Ordered in ED Medications  sodium chloride 0.9 % bolus 2,000 mL (2,000 mLs Intravenous New Bag/Given 10/15/17 2124)  HYDROmorphone (DILAUDID) injection 0.5 mg (0.5 mg Intravenous Given 10/15/17 2117)  ondansetron (ZOFRAN) injection 4 mg (4 mg Intravenous Given 10/15/17 2122)  iopamidol (ISOVUE-300) 61 % injection (75 mLs  Contrast Given 10/15/17 2144)     Initial Impression / Assessment and Plan / ED Course  I have reviewed the triage vital signs and the nursing notes.  Pertinent labs & imaging results that were available during my care of the patient were reviewed by me and considered in my medical decision making (see chart for details).     Pt with  h/o CAD, CHF, CKD, multiple abdominal surgeries presents with epigastric pain. Says the pain started suddenly, w/o provocation, at about noon today; cannot describe the pain, but says it's been moderate in severity w/occasional increases in the pain. Endorses associated anorexia and nausea today; denies F/C, HA, lightheadedness, CP, SOB, diarrhea/constipation, urinary symptoms. Last BM was this morning and normal.  VS & exam as above. EKG: SR @ 72bpm w/1st degree AV block; similar to prior tracings. CXR w/o acute findings. Labs remarkable for WBC 10.9, Crt 1.84, AST 255, ALT 188, AlkPhos 185, lipase >10K. NS bolus, antiemetic, analgesic given.  CT A&P w/"Intra and extrahepatic biliary dilatation with a 10 mm filling defect in the distal common bile duct, possible choledocholithiasis. Recommend ERCP."  Will admit the Pt to the hospitalist for further evaluation and management.  Final Clinical Impressions(s) / ED Diagnoses   Final diagnoses:  Choledocholithiasis  AKI (acute kidney injury) [facility])    ED Discharge Orders    None       Jenny Reichmann, MD 10/15/17 2320    Fatima Blank, MD 10/16/17 0100

## 2017-10-15 NOTE — ED Triage Notes (Signed)
Pt states epigastric pain that started about 1 hour after he at breakfast (11 am).  Pain radiated to lateral ribs and is increasing.  Pain was not reduced with nitro.  Extensive cardiac hx as well as multiple abdominal surgeries.

## 2017-10-15 NOTE — ED Notes (Signed)
Taken to ct at this time. 

## 2017-10-16 ENCOUNTER — Other Ambulatory Visit: Payer: Self-pay

## 2017-10-16 DIAGNOSIS — K8050 Calculus of bile duct without cholangitis or cholecystitis without obstruction: Secondary | ICD-10-CM | POA: Diagnosis present

## 2017-10-16 DIAGNOSIS — E119 Type 2 diabetes mellitus without complications: Secondary | ICD-10-CM | POA: Diagnosis not present

## 2017-10-16 DIAGNOSIS — Z841 Family history of disorders of kidney and ureter: Secondary | ICD-10-CM | POA: Diagnosis not present

## 2017-10-16 DIAGNOSIS — M1990 Unspecified osteoarthritis, unspecified site: Secondary | ICD-10-CM | POA: Diagnosis present

## 2017-10-16 DIAGNOSIS — K859 Acute pancreatitis without necrosis or infection, unspecified: Secondary | ICD-10-CM | POA: Diagnosis not present

## 2017-10-16 DIAGNOSIS — E1142 Type 2 diabetes mellitus with diabetic polyneuropathy: Secondary | ICD-10-CM | POA: Diagnosis present

## 2017-10-16 DIAGNOSIS — I5042 Chronic combined systolic (congestive) and diastolic (congestive) heart failure: Secondary | ICD-10-CM | POA: Diagnosis present

## 2017-10-16 DIAGNOSIS — N183 Chronic kidney disease, stage 3 (moderate): Secondary | ICD-10-CM | POA: Diagnosis present

## 2017-10-16 DIAGNOSIS — E785 Hyperlipidemia, unspecified: Secondary | ICD-10-CM | POA: Diagnosis present

## 2017-10-16 DIAGNOSIS — K219 Gastro-esophageal reflux disease without esophagitis: Secondary | ICD-10-CM | POA: Diagnosis present

## 2017-10-16 DIAGNOSIS — N179 Acute kidney failure, unspecified: Secondary | ICD-10-CM

## 2017-10-16 DIAGNOSIS — D696 Thrombocytopenia, unspecified: Secondary | ICD-10-CM | POA: Diagnosis not present

## 2017-10-16 DIAGNOSIS — Z9049 Acquired absence of other specified parts of digestive tract: Secondary | ICD-10-CM | POA: Diagnosis not present

## 2017-10-16 DIAGNOSIS — Z85038 Personal history of other malignant neoplasm of large intestine: Secondary | ICD-10-CM | POA: Diagnosis not present

## 2017-10-16 DIAGNOSIS — Z9861 Coronary angioplasty status: Secondary | ICD-10-CM | POA: Diagnosis not present

## 2017-10-16 DIAGNOSIS — E1122 Type 2 diabetes mellitus with diabetic chronic kidney disease: Secondary | ICD-10-CM | POA: Diagnosis present

## 2017-10-16 DIAGNOSIS — M109 Gout, unspecified: Secondary | ICD-10-CM | POA: Diagnosis present

## 2017-10-16 DIAGNOSIS — E1136 Type 2 diabetes mellitus with diabetic cataract: Secondary | ICD-10-CM | POA: Diagnosis present

## 2017-10-16 DIAGNOSIS — I252 Old myocardial infarction: Secondary | ICD-10-CM | POA: Diagnosis not present

## 2017-10-16 DIAGNOSIS — Z8249 Family history of ischemic heart disease and other diseases of the circulatory system: Secondary | ICD-10-CM | POA: Diagnosis not present

## 2017-10-16 DIAGNOSIS — Z87891 Personal history of nicotine dependence: Secondary | ICD-10-CM | POA: Diagnosis not present

## 2017-10-16 DIAGNOSIS — I10 Essential (primary) hypertension: Secondary | ICD-10-CM | POA: Diagnosis not present

## 2017-10-16 DIAGNOSIS — K839 Disease of biliary tract, unspecified: Secondary | ICD-10-CM | POA: Diagnosis not present

## 2017-10-16 DIAGNOSIS — I2510 Atherosclerotic heart disease of native coronary artery without angina pectoris: Secondary | ICD-10-CM | POA: Diagnosis present

## 2017-10-16 DIAGNOSIS — I130 Hypertensive heart and chronic kidney disease with heart failure and stage 1 through stage 4 chronic kidney disease, or unspecified chronic kidney disease: Secondary | ICD-10-CM | POA: Diagnosis present

## 2017-10-16 DIAGNOSIS — Z794 Long term (current) use of insulin: Secondary | ICD-10-CM | POA: Diagnosis not present

## 2017-10-16 DIAGNOSIS — K851 Biliary acute pancreatitis without necrosis or infection: Secondary | ICD-10-CM | POA: Diagnosis present

## 2017-10-16 DIAGNOSIS — G4733 Obstructive sleep apnea (adult) (pediatric): Secondary | ICD-10-CM | POA: Diagnosis present

## 2017-10-16 DIAGNOSIS — Z825 Family history of asthma and other chronic lower respiratory diseases: Secondary | ICD-10-CM | POA: Diagnosis not present

## 2017-10-16 LAB — GLUCOSE, CAPILLARY
Glucose-Capillary: 335 mg/dL — ABNORMAL HIGH (ref 65–99)
Glucose-Capillary: 240 mg/dL — ABNORMAL HIGH (ref 65–99)
Glucose-Capillary: 185 mg/dL — ABNORMAL HIGH (ref 65–99)
Glucose-Capillary: 154 mg/dL — ABNORMAL HIGH (ref 65–99)

## 2017-10-16 LAB — COMPREHENSIVE METABOLIC PANEL
ALT: 301 U/L — ABNORMAL HIGH (ref 17–63)
AST: 353 U/L — ABNORMAL HIGH (ref 15–41)
Albumin: 3.2 g/dL — ABNORMAL LOW (ref 3.5–5.0)
Alkaline Phosphatase: 164 U/L — ABNORMAL HIGH (ref 38–126)
Anion gap: 6 (ref 5–15)
BUN: 35 mg/dL — ABNORMAL HIGH (ref 6–20)
CO2: 23 mmol/L (ref 22–32)
Calcium: 8.2 mg/dL — ABNORMAL LOW (ref 8.9–10.3)
Chloride: 104 mmol/L (ref 101–111)
Creatinine, Ser: 1.75 mg/dL — ABNORMAL HIGH (ref 0.61–1.24)
GFR calc Af Amer: 40 mL/min — ABNORMAL LOW (ref >60)
GFR calc non Af Amer: 35 mL/min — ABNORMAL LOW (ref >60)
Glucose, Bld: 284 mg/dL — ABNORMAL HIGH (ref 65–99)
Potassium: 5.5 mmol/L — ABNORMAL HIGH (ref 3.5–5.1)
Sodium: 133 mmol/L — ABNORMAL LOW (ref 135–145)
Total Bilirubin: 3.4 mg/dL — ABNORMAL HIGH (ref 0.3–1.2)
Total Protein: 6.1 g/dL — ABNORMAL LOW (ref 6.5–8.1)

## 2017-10-16 LAB — CBC
HCT: 38.9 % — ABNORMAL LOW (ref 39.0–52.0)
Hemoglobin: 12 g/dL — ABNORMAL LOW (ref 13.0–17.0)
MCH: 28.7 pg (ref 26.0–34.0)
MCHC: 30.8 g/dL (ref 30.0–36.0)
MCV: 93.1 fL (ref 78.0–100.0)
Platelets: 78 10*3/uL — ABNORMAL LOW (ref 150–400)
RBC: 4.18 MIL/uL — ABNORMAL LOW (ref 4.22–5.81)
RDW: 14.9 % (ref 11.5–15.5)
WBC: 8.6 10*3/uL (ref 4.0–10.5)

## 2017-10-16 MED ORDER — INSULIN ASPART 100 UNIT/ML ~~LOC~~ SOLN
0.0000 [IU] | Freq: Every day | SUBCUTANEOUS | Status: DC
  Administered 2017-10-16: 2 [IU] via SUBCUTANEOUS

## 2017-10-16 MED ORDER — INSULIN ASPART 100 UNIT/ML ~~LOC~~ SOLN
0.0000 [IU] | Freq: Four times a day (QID) | SUBCUTANEOUS | Status: DC
  Administered 2017-10-16 (×2): 2 [IU] via SUBCUTANEOUS
  Administered 2017-10-16: 4 [IU] via SUBCUTANEOUS
  Administered 2017-10-16: 3 [IU] via SUBCUTANEOUS
  Administered 2017-10-17 (×3): 1 [IU] via SUBCUTANEOUS
  Administered 2017-10-18: 7 [IU] via SUBCUTANEOUS

## 2017-10-16 MED ORDER — LACTATED RINGERS IV SOLN
INTRAVENOUS | Status: AC
  Administered 2017-10-16 (×2): via INTRAVENOUS

## 2017-10-16 MED ORDER — SODIUM CHLORIDE 0.9 % IV SOLN
INTRAVENOUS | Status: DC
  Administered 2017-10-16: 22:00:00 via INTRAVENOUS

## 2017-10-16 MED ORDER — CARVEDILOL 12.5 MG PO TABS
12.5000 mg | ORAL_TABLET | Freq: Two times a day (BID) | ORAL | Status: DC
  Administered 2017-10-16 – 2017-10-18 (×3): 12.5 mg via ORAL
  Filled 2017-10-16 (×4): qty 1

## 2017-10-16 MED ORDER — VITAMIN D 1000 UNITS PO TABS
1000.0000 [IU] | ORAL_TABLET | Freq: Every day | ORAL | Status: DC
  Administered 2017-10-16 – 2017-10-18 (×3): 1000 [IU] via ORAL
  Filled 2017-10-16 (×3): qty 1

## 2017-10-16 MED ORDER — ATORVASTATIN CALCIUM 80 MG PO TABS
80.0000 mg | ORAL_TABLET | Freq: Every day | ORAL | Status: DC
  Administered 2017-10-16 – 2017-10-18 (×3): 80 mg via ORAL
  Filled 2017-10-16 (×3): qty 1

## 2017-10-16 MED ORDER — HYDROMORPHONE HCL 1 MG/ML IJ SOLN: 0.4000 mg | INTRAMUSCULAR | Status: DC | PRN

## 2017-10-16 MED ORDER — GABAPENTIN 300 MG PO CAPS
300.0000 mg | ORAL_CAPSULE | Freq: Two times a day (BID) | ORAL | Status: DC
  Administered 2017-10-16 – 2017-10-18 (×5): 300 mg via ORAL
  Filled 2017-10-16 (×6): qty 1

## 2017-10-16 MED ORDER — INSULIN GLARGINE 100 UNIT/ML ~~LOC~~ SOLN
20.0000 [IU] | Freq: Every day | SUBCUTANEOUS | Status: DC
  Administered 2017-10-16 – 2017-10-18 (×3): 20 [IU] via SUBCUTANEOUS
  Filled 2017-10-16 (×3): qty 0.2

## 2017-10-16 MED ORDER — OXYCODONE HCL 5 MG PO TABS: 5.0000 mg | ORAL_TABLET | ORAL | Status: DC | PRN

## 2017-10-16 MED ORDER — TAMSULOSIN HCL 0.4 MG PO CAPS
0.4000 mg | ORAL_CAPSULE | Freq: Every evening | ORAL | Status: DC
  Administered 2017-10-16 – 2017-10-17 (×2): 0.4 mg via ORAL
  Filled 2017-10-16 (×2): qty 1

## 2017-10-16 MED ORDER — ALLOPURINOL 100 MG PO TABS
100.0000 mg | ORAL_TABLET | Freq: Two times a day (BID) | ORAL | Status: DC
  Administered 2017-10-16 – 2017-10-18 (×5): 100 mg via ORAL
  Filled 2017-10-16 (×5): qty 1

## 2017-10-16 MED ORDER — SODIUM CHLORIDE 0.9% FLUSH
3.0000 mL | Freq: Two times a day (BID) | INTRAVENOUS | Status: DC
  Administered 2017-10-16 – 2017-10-18 (×3): 3 mL via INTRAVENOUS

## 2017-10-16 MED ORDER — ASPIRIN EC 81 MG PO TBEC
81.0000 mg | DELAYED_RELEASE_TABLET | Freq: Every day | ORAL | Status: DC
  Administered 2017-10-16 – 2017-10-18 (×3): 81 mg via ORAL
  Filled 2017-10-16 (×3): qty 1

## 2017-10-16 MED ORDER — CIPROFLOXACIN IN D5W 400 MG/200ML IV SOLN
400.0000 mg | Freq: Two times a day (BID) | INTRAVENOUS | Status: DC
  Administered 2017-10-16 – 2017-10-17 (×3): 400 mg via INTRAVENOUS
  Filled 2017-10-16 (×5): qty 200

## 2017-10-16 MED ORDER — PANTOPRAZOLE SODIUM 40 MG PO TBEC
40.0000 mg | DELAYED_RELEASE_TABLET | Freq: Two times a day (BID) | ORAL | Status: DC
  Administered 2017-10-16 (×2): 40 mg via ORAL
  Filled 2017-10-16 (×2): qty 1

## 2017-10-16 NOTE — H&P (View-Only) (Signed)
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  He was evaluated by Dr. Loletha Carrow 08/2017 for complaints of noncardiac chest pain.  Cardiology evaluated him and then referred him to Dr. Loletha Carrow for further evaluation.  An EGD was performed in 09/2017 and the examination was normal.  Yesterday afternoon he started to have epigastric abdominal pain.  He took NTG without any improvement and then he presented to the ER.  His lipase on admission was >10,000 with a normal WBC.  His creatinine was elevated and his liver panel displays an obstructive pattern.  The CT scan shows a 2 cm CBD dilation at the porta hepatis and a 1 cm distal CBD filling defect.  There is intrahepatic biliary ductal dilation.  Past Medical History:  Diagnosis Date  . Arthritis   . CAD (coronary artery disease)    a. 1997 CABGx3 (VG->RPDA, VG->PLV, LIMA->LAD);  b. 07/2004 PCI VG->PLV (3.5x16 Taxus DES, 3.5x12 Taxus DES); c. 11/2009 PCI: VG->PLV 3.5x12 Promus DES), VG->RPDA (3.5x15 Promus DES); d. 08/2012 Cath: patent grafts; e. 05/2013 Lexiscan MV: no ischemia/infarction, EF 44%.  . Cancer [facility] 2005   colon surgery done  . Cataract   . Chronic combined systolic and diastolic CHF (congestive heart failure) ([place]) 08/2012   a. EF 50-55% 11/2011; b. 08/2012 - EF 25-30%,  with mild RM, mod TR, mod RAE & LAE, moderate reduced RV systolic function and PASP of 68mmHg. - > cath showed patent grafts & Mod Puml HTN wth elevated PCWP;  c. 01/2014 Echo: EF 35-40%, mod LVH, mod HK, mildly dil LA, mod dil RA, PASP 38mmHg.  . CKD (chronic kidney disease), stage III ([place])   . Dyslipidemia   . GERD (gastroesophageal reflux disease)   . Gout   .  History of home oxygen therapy    uses oxygen with cpap low level not sure how many liters  . Hyperkalemia   . Hypertension   . Insulin Dependent Diabetes mellitus    type 2  . Myocardial infarction ([place]) 1997  . Sleep apnea   . Thrombocytopenia ([place])   . UTI (lower urinary tract infection) 08/2012   E.coli    Past Surgical History:  Procedure Laterality Date  . APPENDECTOMY    . BACK SURGERY     15 years ago lower  . CARDIAC CATHETERIZATION  2005   stenting of the vein graft to the posterior lateral per -- Dr. Martinique      . CARDIAC CATHETERIZATION  2011   showed right coronary, totally occluded LAD and severe stenosis in both saphenous vein graft to the posterior lateral and posterior descending  -- stenting of the proximal portion of the SVG to the posterior lateral branch in 2/11  . CHOLECYSTECTOMY    . CORONARY ARTERY BYPASS GRAFT  1997    (LIMA to LAD, SVG to PDA, SVG to PL)  . ESOPHAGOGASTRODUODENOSCOPY (EGD) WITH PROPOFOL N/A 09/21/2017   Procedure: ESOPHAGOGASTRODUODENOSCOPY (EGD) WITH PROPOFOL;  Surgeon: Doran Stabler, MD;  Location: [facility] ENDOSCOPY;  Service: Gastroenterology;  Laterality: N/A;  . EYE SURGERY     ioc for cataract  . HERNIA REPAIR    . LEFT AND RIGHT HEART CATHETERIZATION WITH CORONARY/GRAFT ANGIOGRAM  09/04/2012   Procedure: LEFT AND RIGHT HEART CATHETERIZATION WITH Beatrix Fetters;  Surgeon: Sherren Mocha, MD;  Location: [facility] CATH LAB;  patent grafts, mod 2ndary pulmonary HTN, elevated LV EDP & PCWP  . LEFT HEART CATH AND CORS/GRAFTS ANGIOGRAPHY N/A 06/17/2017   Procedure: LEFT HEART CATH AND CORS/GRAFTS ANGIOGRAPHY;  Surgeon: Martinique, Peter M, MD;  Location: [facility] CV LAB;  Service: Cardiovascular;  Laterality: N/A;  . PARTIAL COLECTOMY     cancerous polyps  . stent to heart  last done 5 to 6 yrs ago   x 4    Family History  Problem Relation Age of Onset  . Coronary artery disease Father   . Heart attack Father   . Coronary artery  disease Brother        1/2 brother   . Kidney disease Sister   . Cardiomyopathy Mother   . Asthma Brother     Social History:  reports that he quit smoking about 34 years ago. His smoking use included cigarettes. He has a 52.50 pack-year smoking history. he has never used smokeless tobacco. He reports that he drinks alcohol. He reports that he does not use drugs.  Allergies: No Known Allergies  Medications:  Scheduled: . allopurinol  100 mg Oral BID  . aspirin EC  81 mg Oral Daily  . atorvastatin  80 mg Oral Daily  . carvedilol  12.5 mg Oral BID WC  . cholecalciferol  1,000 Units Oral Daily  . gabapentin  300 mg Oral BID  . insulin aspart  0-5 Units Subcutaneous QHS  . insulin aspart  0-9 Units Subcutaneous Q6H  . insulin glargine  20 Units Subcutaneous Daily  . pantoprazole  40 mg Oral BID  . sodium chloride flush  3 mL Intravenous Q12H  . tamsulosin  0.4 mg Oral QPM   Continuous: . lactated ringers 100 mL/hr at 10/16/17 0205    Results for orders placed or performed during the hospital encounter of 10/15/17 (from the past 24 hour(s))  Lipase, blood     Status: Abnormal   Collection Time: 10/15/17  5:45 PM  Result Value Ref Range   Lipase >10,000 (H) 11 - 51 U/L  Comprehensive metabolic panel     Status: Abnormal   Collection Time: 10/15/17  5:45 PM  Result Value Ref Range   Sodium 134 (L) 135 - 145 mmol/L   Potassium 4.8 3.5 - 5.1 mmol/L   Chloride 97 (L) 101 - 111 mmol/L   CO2 28 22 - 32 mmol/L   Glucose, Bld 234 (H) 65 - 99 mg/dL   BUN 34 (H) 6 - 20 mg/dL   Creatinine, Ser 1.84 (H) 0.61 - 1.24 mg/dL   Calcium 9.4 8.9 - 10.3 mg/dL   Total Protein 7.7 6.5 - 8.1 g/dL   Albumin 4.1 3.5 - 5.0 g/dL   AST 255 (H) 15 - 41 U/L   ALT 188 (H) 17 - 63 U/L   Alkaline Phosphatase 185 (H) 38 - 126 U/L   Total Bilirubin 2.3 (H) 0.3 - 1.2 mg/dL   GFR calc non Af Amer 32 (L) >60 mL/min   GFR calc Af Amer 38 (L) >60 mL/min   Anion gap 9 5 - 15  CBC     Status: Abnormal    Collection Time: 10/15/17  5:45 PM  Result Value Ref Range   WBC 10.9 (H) 4.0 - 10.5 K/uL   RBC 4.61 4.22 - 5.81 MIL/uL   Hemoglobin 13.7 13.0 - 17.0 g/dL  HCT 43.2 39.0 - 52.0 %   MCV 93.7 78.0 - 100.0 fL   MCH 29.7 26.0 - 34.0 pg   MCHC 31.7 30.0 - 36.0 g/dL   RDW 14.7 11.5 - 15.5 %   Platelets 102 (L) 150 - 400 K/uL  I-Stat Troponin, ED (not at [facility])     Status: None   Collection Time: 10/15/17  6:04 PM  Result Value Ref Range   Troponin i, poc 0.00 0.00 - 0.08 ng/mL   Comment 3          Glucose, capillary     Status: Abnormal   Collection Time: 10/16/17  2:06 AM  Result Value Ref Range   Glucose-Capillary 335 (H) 65 - 99 mg/dL   Comment 1 Document in Chart   Comprehensive metabolic panel     Status: Abnormal   Collection Time: 10/16/17  4:20 AM  Result Value Ref Range   Sodium 133 (L) 135 - 145 mmol/L   Potassium 5.5 (H) 3.5 - 5.1 mmol/L   Chloride 104 101 - 111 mmol/L   CO2 23 22 - 32 mmol/L   Glucose, Bld 284 (H) 65 - 99 mg/dL   BUN 35 (H) 6 - 20 mg/dL   Creatinine, Ser 1.75 (H) 0.61 - 1.24 mg/dL   Calcium 8.2 (L) 8.9 - 10.3 mg/dL   Total Protein 6.1 (L) 6.5 - 8.1 g/dL   Albumin 3.2 (L) 3.5 - 5.0 g/dL   AST 353 (H) 15 - 41 U/L   ALT 301 (H) 17 - 63 U/L   Alkaline Phosphatase 164 (H) 38 - 126 U/L   Total Bilirubin 3.4 (H) 0.3 - 1.2 mg/dL   GFR calc non Af Amer 35 (L) >60 mL/min   GFR calc Af Amer 40 (L) >60 mL/min   Anion gap 6 5 - 15  CBC     Status: Abnormal   Collection Time: 10/16/17  4:20 AM  Result Value Ref Range   WBC 8.6 4.0 - 10.5 K/uL   RBC 4.18 (L) 4.22 - 5.81 MIL/uL   Hemoglobin 12.0 (L) 13.0 - 17.0 g/dL   HCT 38.9 (L) 39.0 - 52.0 %   MCV 93.1 78.0 - 100.0 fL   MCH 28.7 26.0 - 34.0 pg   MCHC 30.8 30.0 - 36.0 g/dL   RDW 14.9 11.5 - 15.5 %   Platelets 78 (L) 150 - 400 K/uL  Glucose, capillary     Status: Abnormal   Collection Time: 10/16/17  8:15 AM  Result Value Ref Range   Glucose-Capillary 240 (H) 65 - 99 mg/dL     Dg Chest 2  View  Result Date: 10/15/2017 CLINICAL DATA:  Epigastric pain. EXAM: CHEST  2 VIEW COMPARISON:  05/21/2017 FINDINGS: The cardiopericardial silhouette is within normal limits for size. The lungs are clear without focal pneumonia, edema, pneumothorax or pleural effusion. Interstitial markings are diffusely coarsened with chronic features. The visualized bony structures of the thorax are intact. IMPRESSION: Stable. Chronic interstitial coarsening without acute cardiopulmonary findings. Electronically Signed   By: Misty Stanley M.D.   On: 10/15/2017 18:54   Ct Abdomen Pelvis W Contrast  Result Date: 10/15/2017 CLINICAL DATA:  Epigastric pain. Pancreatitis suspected, atypical presentation/labs EXAM: CT ABDOMEN AND PELVIS WITH CONTRAST TECHNIQUE: Multidetector CT imaging of the abdomen and pelvis was performed using the standard protocol following bolus administration of intravenous contrast. CONTRAST:  24mL ISOVUE-300 IOPAMIDOL (ISOVUE-300) INJECTION 61% COMPARISON:  None available. FINDINGS: Lower chest: Cardiomegaly with coronary artery calcifications. Post  median sternotomy. Subpleural reticulation in both lower lobes, mild. No pleural fluid or consolidation. Hepatobiliary: Postcholecystectomy. Intra and extrahepatic biliary ductal dilatation with common bile duct measuring 2 cm at porta hepatis. Possible rounded 10 mm filling defect in the distal common bile duct at the duodenal insertion. No discrete focal hepatic lesion. Pancreas: Mild parenchymal atrophy. Probable 10 mm pancreatic cyst in the body (image 34 series 3) versus less likely volume averaging. No ductal dilatation or inflammation. Spleen: Normal in size without focal abnormality. Adrenals/Urinary Tract: Normal adrenal glands. Bilateral perinephric edema, symmetric. Multiple small bilateral low-density renal lesions, majority of which are too small to characterize. 17 mm exophytic lesion from the lower left kidney has Hounsfield units of 70 and is  consistent with a benign hemorrhagic cyst. No significant excretion on delayed phase imaging suggesting renal dysfunction. Urinary bladder is physiologically distended without wall thickening. Stomach/Bowel: Stomach physiologically distended. No small bowel inflammation, wall thickening or obstruction. Appendix is surgically absent. Enteric sutures in the ascending colon. Umbilical hernia with neck 7.0 cm contains nonobstructed noninflamed transverse colon. No colonic inflammation or wall thickening. No significant diverticular disease. Vascular/Lymphatic: Calcified and noncalcified atheromatous plaque in the abdominal aorta. Focal aneurysmal dilatation is day inferior to the renal arteries measuring up to 3.8 cm. No evidence of aortic rupture. Moderate to advanced iliac atherosclerosis and tortuosity. No acute vascular findings. No enlarged abdominal or pelvic lymph nodes. Reproductive: Prominent prostate gland spanning 5.5 cm with mild mass effect on the bladder base. Other: No ascites.  No intra-abdominal abscess or free air. Musculoskeletal: Multilevel degenerative change in the spine. There are no acute or suspicious osseous abnormalities. IMPRESSION: 1. Post cholecystectomy. Intra and extrahepatic biliary dilatation with a 10 mm filling defect in the distal common bile duct, possible choledocholithiasis. Recommend ERCP. MRCP could be considered if patient is able tolerate breath hold technique. 2. No evidence pancreatic inflammation or pancreatic ductal dilatation. Possible 10 mm pancreatic cyst. This is likely incidental, however incompletely characterized. The decision to pursue further imaging should be anchored to patient's overall health and perferences. Recommend follow up pre and post contrast MRI/MRCP or pancreatic protocol CT in 2 years. This recommendation follows ACR consensus guidelines: Management of Incidental Pancreatic Cysts: A White Paper of the ACR Incidental Findings Committee. J Am Coll  Radiol 9604;54:098-119. 3. Umbilical hernia containing nonobstructed noninflamed transverse colon. 4. Bilateral renal cysts including a hemorrhagic cyst in the left kidney. 5. Aortic atherosclerosis with focal aneurysmal dilatation of the infrarenal aorta, 3.8 cm. Recommend followup by ultrasound in 2 years. This recommendation follows ACR consensus guidelines: White Paper of the ACR Incidental Findings Committee II on Vascular Findings. J Am Coll Radiol 2013; 10:789-794. Aortic Atherosclerosis (ICD10-I70.0). Aortic aneurysm NOS (ICD10-I71.9). Electronically Signed   By: Jeb Levering M.D.   On: 10/15/2017 22:50    ROS:  As stated above in the HPI otherwise negative.  Blood pressure (!) 153/71, pulse 63, temperature 98.6 F (37 C), temperature source Oral, resp. rate (!) 21, height 6' (1.829 m), weight 99.8 kg (220 lb), SpO2 96 %.    PE: Gen: NAD, Alert and Oriented HEENT:  [place]/AT, EOMI Neck: Supple, no LAD Lungs: CTA Bilaterally CV: RRR without M/G/R ABM: Soft, NTND, +BS Ext: No C/C/E  Assessment/Plan: 1) Gallstone pancreatitis. 2) Choledocholithiasis. 3) CAD.   Surprisingly the CT scan does not show any significant pancreatitis.  He does require an ERCP for further treatment to resolve his condition.  This was most likely the source of his noncardiac chest  pain.  He is feeling well at this time.  Currently he is not experiencing any pain, but he is using has used pain medications.  Plan: 1) ERCP with stone extraction.  Jabri Blancett D 10/16/2017, 10:50 AM

## 2017-10-16 NOTE — H&P (Signed)
History and Physical   MAGIC MOHLER MGN:[id number] DOB: Oct 19, 1932 DOA: 10/15/2017  PCP: Reubin Milan, MD  Chief Complaint: Abdominal pain  HPI: This is an 84 year old man with coronary artery disease, chronic systolic heart failure, OSA on CPAP and 3.5 L nasal cannula O2 daily at bedtime, insulin-dependent diabetes, CKD stage III presenting with abdominal pain.  Patient reports onset of pain in the early afternoon on 10/15/2017. He describes the pain as sharp, rates 6 out of 10 in intensity, located in the epigastrium, does not radiate. He took 4 sublingual nitroglycerin did not help. Associated symptoms include shortness of breath. Denies any nausea or vomiting. Does report decreased appetite. No fevers, chills. He lives alone, present for drinks 2-3 times a week on each occasion has 2 to 3 beverages. Reports being independent in his ADLs and IADLs, does not use any assist devices.  Of note, he has had similar pain prior, he reports having a cardiac catheterization in October 2018 that did not reveal any significant obstructive coronary disease. He underwent EGD in December 2018 which not reveal any  abnormality. He's been taking PPI  ED Course: Vital signs remarkable for hypertension with systolics in the 916X, normal heart rate, creatinine of 1.84, total bilirubin of 2.3, AST of 255, failed to 188, alkaline phosphatase 185 area and lipase over 10,000. Troponin normal. Hemoglobin 13.7, white blood cell count of 10.9. CT scan of the abdomen and pelvis revealed status post cholecystectomy status, intravenous drip attic biliary dilation with a 10 mm filling defect in the distal common bile duct-possible choledocholithiasis, no evidence of pancreatic inflammation. Possible 10 mm pink chronic cyst. Hospital medicine was consulted for further management. Treatment patient received in the emergency department included 2 L of isotonic fluid, IV hydromorphone.  Review of Systems: A complete ROS was  obtained; pertinent positives negatives are denoted in the HPI. Otherwise, all systems are negative.   Past Medical History:  Diagnosis Date  . Arthritis   . CAD (coronary artery disease)    a. 1997 CABGx3 (VG->RPDA, VG->PLV, LIMA->LAD);  b. 07/2004 PCI VG->PLV (3.5x16 Taxus DES, 3.5x12 Taxus DES); c. 11/2009 PCI: VG->PLV 3.5x12 Promus DES), VG->RPDA (3.5x15 Promus DES); d. 08/2012 Cath: patent grafts; e. 05/2013 Lexiscan MV: no ischemia/infarction, EF 44%.  . Cancer [facility]) 2005   colon surgery done  . Cataract   . Chronic combined systolic and diastolic CHF (congestive heart failure) ([place]) 08/2012   a. EF 50-55% 11/2011; b. 08/2012 - EF 25-30%,  with mild RM, mod TR, mod RAE & LAE, moderate reduced RV systolic function and PASP of 78mmHg. - > cath showed patent grafts & Mod Puml HTN wth elevated PCWP;  c. 01/2014 Echo: EF 35-40%, mod LVH, mod HK, mildly dil LA, mod dil RA, PASP 65mmHg.  . CKD (chronic kidney disease), stage III ([place])   . Dyslipidemia   . GERD (gastroesophageal reflux disease)   . Gout   . History of home oxygen therapy    uses oxygen with cpap low level not sure how many liters  . Hyperkalemia   . Hypertension   . Insulin Dependent Diabetes mellitus    type 2  . Myocardial infarction ([place]) 1997  . Sleep apnea   . Thrombocytopenia ([place])   . UTI (lower urinary tract infection) 08/2012   E.coli   Social History   Socioeconomic History  . Marital status: Single    Spouse name: Not on file  . Number of children: 0  . Years of education: Not  on file  . Highest education level: Not on file  Social Needs  . Financial resource strain: Not on file  . Food insecurity - worry: Not on file  . Food insecurity - inability: Not on file  . Transportation needs - medical: Not on file  . Transportation needs - non-medical: Not on file  Occupational History  . Occupation: retired   ran a Office manager course  Tobacco Use  . Smoking status: Former Smoker    Packs/day: 1.50    Years:  35.00    Pack years: 52.50    Types: Cigarettes    Last attempt to quit: 10/12/1983    Years since quitting: 34.0  . Smokeless tobacco: Never Used  Substance and Sexual Activity  . Alcohol use: Yes    Alcohol/week: 0.0 oz    Comment: 2-3 drinks 3-4x/wk.  . Drug use: No  . Sexual activity: Not Currently    Birth control/protection: None  Other Topics Concern  . Not on file  Social History Narrative   Lives in [place] by himself.  Active but does not routinely exercise.   Family History  Problem Relation Age of Onset  . Coronary artery disease Father   . Heart attack Father   . Coronary artery disease Brother        1/2 brother   . Kidney disease Sister   . Cardiomyopathy Mother   . Asthma Brother     Physical Exam: Vitals:   10/15/17 2215 10/15/17 2245 10/15/17 2315 10/15/17 2318  BP: (!) 154/64 (!) 141/63 140/62   Pulse: 75 71  70  Resp: 18 20  (!) 25  Temp:      TempSrc:      SpO2: 91% 92%  92%  Weight:      Height:       General: Appears calm and comfortable, white man. ENT: Grossly normal hearing, MMM.  Cardiovascular: RRR. No M/R/G. No LE edema.  Respiratory: CTA bilaterally. No wheezes or crackles. Normal respiratory effort. Abdomen: Soft, large ventral hernia, mild tender to palpation in epigastrium Skin: No rash or induration seen on limited exam. Musculoskeletal: Grossly normal tone BUE/BLE. Appropriate ROM.  Psychiatric: Grossly normal mood and affect. Neurologic: Moves all extremities in coordinated fashion.  I have personally reviewed the following labs, culture data, and imaging studies.  Assessment/Plan:  Pancreatitis, likely  Choledocholithiasis, suspected This 84 year old man with regular ETOH consumption presents with recurrence of epigastric pain.  While abdominal pain and elevated lipase suggest acute pancreatitis; CT showing no evidence of pancreatic inflammation raises the question as to whether this is truly the driving force to his symptoms.  His abdominal pain may be related to biliary stones in CBD; which while less common in the presence of prior cholecystectomy, still possible. Plan: *NPO and GI consult in am to decide if MRCP or ERCP most appropriate *will continue with IVF support cautiously with LR at 100 cc q hr given hx of systolic HF (normally takes furosemide 40 mg po bid) *Pain control with po oxycodone and as needed breakthrough IV opioids *currently not septic appearing; if he displays any signs of infectious symptoms or if rapidly increasing T bili or LFTs, low threshold to initiate systemic antimicrobial therapy to treat developing cholangitis  Other problems -CAD, chronic systolic HF: continue ASA, statin, BB; holding furosemide and ARB not in HF at this time -Insulin dependent DM: reports taking 35 u of 70/30 BID, unable to report what BS run at home, will provide  lower amount of basal insulin at 20 units daily and rapid acting corrective factor q 6 hours while NPO -Hx of gout: continue home allopurinol -GERD: continue PPI -Peripheral neuropathy: continue home gabapentin -Regular ETOH use: patient reports  DVT prophylaxis: SCDs; no pharmacologic in the event patient goes for ERCP and sphincterotomy to reduce bleeding risk Code Status: full code Disposition Plan: Anticipate D/C home in 2-5 days Consults called: none yet, anticipate GI consultation in AM Admission status: admit to hospital medicine   Cheri Rous, MD [facility] Page:[phone]  If 7PM-7AM, please contact night-coverage [email or web address] Password [email or web address]

## 2017-10-16 NOTE — Consult Note (Signed)
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  He was evaluated by Dr. Loletha Carrow 08/2017 for complaints of noncardiac chest pain.  Cardiology evaluated him and then referred him to Dr. Loletha Carrow for further evaluation.  An EGD was performed in 09/2017 and the examination was normal.  Yesterday afternoon he started to have epigastric abdominal pain.  He took NTG without any improvement and then he presented to the ER.  His lipase on admission was >10,000 with a normal WBC.  His creatinine was elevated and his liver panel displays an obstructive pattern.  The CT scan shows a 2 cm CBD dilation at the porta hepatis and a 1 cm distal CBD filling defect.  There is intrahepatic biliary ductal dilation.  Past Medical History:  Diagnosis Date  . Arthritis   . CAD (coronary artery disease)    a. 1997 CABGx3 (VG->RPDA, VG->PLV, LIMA->LAD);  b. 07/2004 PCI VG->PLV (3.5x16 Taxus DES, 3.5x12 Taxus DES); c. 11/2009 PCI: VG->PLV 3.5x12 Promus DES), VG->RPDA (3.5x15 Promus DES); d. 08/2012 Cath: patent grafts; e. 05/2013 Lexiscan MV: no ischemia/infarction, EF 44%.  . Cancer [facility]) 2005   colon surgery done  . Cataract   . Chronic combined systolic and diastolic CHF (congestive heart failure) ([place]) 08/2012   a. EF 50-55% 11/2011; b. 08/2012 - EF 25-30%,  with mild RM, mod TR, mod RAE & LAE, moderate reduced RV systolic function and PASP of 75mmHg. - > cath showed patent grafts & Mod Puml HTN wth elevated PCWP;  c. 01/2014 Echo: EF 35-40%, mod LVH, mod HK, mildly dil LA, mod dil RA, PASP 39mmHg.  . CKD (chronic kidney disease), stage III ([place])   . Dyslipidemia   . GERD (gastroesophageal reflux disease)   . Gout   .  History of home oxygen therapy    uses oxygen with cpap low level not sure how many liters  . Hyperkalemia   . Hypertension   . Insulin Dependent Diabetes mellitus    type 2  . Myocardial infarction ([name]) 1997  . Sleep apnea   . Thrombocytopenia ([place])   . UTI (lower urinary tract infection) 08/2012   E.coli    Past Surgical History:  Procedure Laterality Date  . APPENDECTOMY    . BACK SURGERY     15 years ago lower  . CARDIAC CATHETERIZATION  2005   stenting of the vein graft to the posterior lateral per -- Dr. Martinique      . CARDIAC CATHETERIZATION  2011   showed right coronary, totally occluded LAD and severe stenosis in both saphenous vein graft to the posterior lateral and posterior descending  -- stenting of the proximal portion of the SVG to the posterior lateral branch in 2/11  . CHOLECYSTECTOMY    . CORONARY ARTERY BYPASS GRAFT  1997    (LIMA to LAD, SVG to PDA, SVG to PL)  . ESOPHAGOGASTRODUODENOSCOPY (EGD) WITH PROPOFOL N/A 09/21/2017   Procedure: ESOPHAGOGASTRODUODENOSCOPY (EGD) WITH PROPOFOL;  Surgeon: Doran Stabler, MD;  Location: [facility] ENDOSCOPY;  Service: Gastroenterology;  Laterality: N/A;  . EYE SURGERY     ioc for cataract  . HERNIA REPAIR    . LEFT AND RIGHT HEART CATHETERIZATION WITH CORONARY/GRAFT ANGIOGRAM  09/04/2012   Procedure: LEFT AND RIGHT HEART CATHETERIZATION WITH Beatrix Fetters;  Surgeon: Sherren Mocha, MD;  Location: [facility] CATH LAB;  patent grafts, mod 2ndary pulmonary HTN, elevated LV EDP & PCWP  . LEFT HEART CATH AND CORS/GRAFTS ANGIOGRAPHY N/A 06/17/2017   Procedure: LEFT HEART CATH AND CORS/GRAFTS ANGIOGRAPHY;  Surgeon: Martinique, Peter M, MD;  Location: [facility] CV LAB;  Service: Cardiovascular;  Laterality: N/A;  . PARTIAL COLECTOMY     cancerous polyps  . stent to heart  last done 5 to 6 yrs ago   x 4    Family History  Problem Relation Age of Onset  . Coronary artery disease Father   . Heart attack Father   . Coronary artery  disease Brother        1/2 brother   . Kidney disease Sister   . Cardiomyopathy Mother   . Asthma Brother     Social History:  reports that he quit smoking about 34 years ago. His smoking use included cigarettes. He has a 52.50 pack-year smoking history. he has never used smokeless tobacco. He reports that he drinks alcohol. He reports that he does not use drugs.  Allergies: No Known Allergies  Medications:  Scheduled: . allopurinol  100 mg Oral BID  . aspirin EC  81 mg Oral Daily  . atorvastatin  80 mg Oral Daily  . carvedilol  12.5 mg Oral BID WC  . cholecalciferol  1,000 Units Oral Daily  . gabapentin  300 mg Oral BID  . insulin aspart  0-5 Units Subcutaneous QHS  . insulin aspart  0-9 Units Subcutaneous Q6H  . insulin glargine  20 Units Subcutaneous Daily  . pantoprazole  40 mg Oral BID  . sodium chloride flush  3 mL Intravenous Q12H  . tamsulosin  0.4 mg Oral QPM   Continuous: . lactated ringers 100 mL/hr at 10/16/17 0205    Results for orders placed or performed during the hospital encounter of 10/15/17 (from the past 24 hour(s))  Lipase, blood     Status: Abnormal   Collection Time: 10/15/17  5:45 PM  Result Value Ref Range   Lipase >10,000 (H) 11 - 51 U/L  Comprehensive metabolic panel     Status: Abnormal   Collection Time: 10/15/17  5:45 PM  Result Value Ref Range   Sodium 134 (L) 135 - 145 mmol/L   Potassium 4.8 3.5 - 5.1 mmol/L   Chloride 97 (L) 101 - 111 mmol/L   CO2 28 22 - 32 mmol/L   Glucose, Bld 234 (H) 65 - 99 mg/dL   BUN 34 (H) 6 - 20 mg/dL   Creatinine, Ser 1.84 (H) 0.61 - 1.24 mg/dL   Calcium 9.4 8.9 - 10.3 mg/dL   Total Protein 7.7 6.5 - 8.1 g/dL   Albumin 4.1 3.5 - 5.0 g/dL   AST 255 (H) 15 - 41 U/L   ALT 188 (H) 17 - 63 U/L   Alkaline Phosphatase 185 (H) 38 - 126 U/L   Total Bilirubin 2.3 (H) 0.3 - 1.2 mg/dL   GFR calc non Af Amer 32 (L) >60 mL/min   GFR calc Af Amer 38 (L) >60 mL/min   Anion gap 9 5 - 15  CBC     Status: Abnormal    Collection Time: 10/15/17  5:45 PM  Result Value Ref Range   WBC 10.9 (H) 4.0 - 10.5 K/uL   RBC 4.61 4.22 - 5.81 MIL/uL   Hemoglobin 13.7 13.0 - 17.0 g/dL  HCT 43.2 39.0 - 52.0 %   MCV 93.7 78.0 - 100.0 fL   MCH 29.7 26.0 - 34.0 pg   MCHC 31.7 30.0 - 36.0 g/dL   RDW 14.7 11.5 - 15.5 %   Platelets 102 (L) 150 - 400 K/uL  I-Stat Troponin, ED (not at [facility])     Status: None   Collection Time: 10/15/17  6:04 PM  Result Value Ref Range   Troponin i, poc 0.00 0.00 - 0.08 ng/mL   Comment 3          Glucose, capillary     Status: Abnormal   Collection Time: 10/16/17  2:06 AM  Result Value Ref Range   Glucose-Capillary 335 (H) 65 - 99 mg/dL   Comment 1 Document in Chart   Comprehensive metabolic panel     Status: Abnormal   Collection Time: 10/16/17  4:20 AM  Result Value Ref Range   Sodium 133 (L) 135 - 145 mmol/L   Potassium 5.5 (H) 3.5 - 5.1 mmol/L   Chloride 104 101 - 111 mmol/L   CO2 23 22 - 32 mmol/L   Glucose, Bld 284 (H) 65 - 99 mg/dL   BUN 35 (H) 6 - 20 mg/dL   Creatinine, Ser 1.75 (H) 0.61 - 1.24 mg/dL   Calcium 8.2 (L) 8.9 - 10.3 mg/dL   Total Protein 6.1 (L) 6.5 - 8.1 g/dL   Albumin 3.2 (L) 3.5 - 5.0 g/dL   AST 353 (H) 15 - 41 U/L   ALT 301 (H) 17 - 63 U/L   Alkaline Phosphatase 164 (H) 38 - 126 U/L   Total Bilirubin 3.4 (H) 0.3 - 1.2 mg/dL   GFR calc non Af Amer 35 (L) >60 mL/min   GFR calc Af Amer 40 (L) >60 mL/min   Anion gap 6 5 - 15  CBC     Status: Abnormal   Collection Time: 10/16/17  4:20 AM  Result Value Ref Range   WBC 8.6 4.0 - 10.5 K/uL   RBC 4.18 (L) 4.22 - 5.81 MIL/uL   Hemoglobin 12.0 (L) 13.0 - 17.0 g/dL   HCT 38.9 (L) 39.0 - 52.0 %   MCV 93.1 78.0 - 100.0 fL   MCH 28.7 26.0 - 34.0 pg   MCHC 30.8 30.0 - 36.0 g/dL   RDW 14.9 11.5 - 15.5 %   Platelets 78 (L) 150 - 400 K/uL  Glucose, capillary     Status: Abnormal   Collection Time: 10/16/17  8:15 AM  Result Value Ref Range   Glucose-Capillary 240 (H) 65 - 99 mg/dL     Dg Chest 2  View  Result Date: 10/15/2017 CLINICAL DATA:  Epigastric pain. EXAM: CHEST  2 VIEW COMPARISON:  05/21/2017 FINDINGS: The cardiopericardial silhouette is within normal limits for size. The lungs are clear without focal pneumonia, edema, pneumothorax or pleural effusion. Interstitial markings are diffusely coarsened with chronic features. The visualized bony structures of the thorax are intact. IMPRESSION: Stable. Chronic interstitial coarsening without acute cardiopulmonary findings. Electronically Signed   By: Misty Stanley M.D.   On: 10/15/2017 18:54   Ct Abdomen Pelvis W Contrast  Result Date: 10/15/2017 CLINICAL DATA:  Epigastric pain. Pancreatitis suspected, atypical presentation/labs EXAM: CT ABDOMEN AND PELVIS WITH CONTRAST TECHNIQUE: Multidetector CT imaging of the abdomen and pelvis was performed using the standard protocol following bolus administration of intravenous contrast. CONTRAST:  63mL ISOVUE-300 IOPAMIDOL (ISOVUE-300) INJECTION 61% COMPARISON:  None available. FINDINGS: Lower chest: Cardiomegaly with coronary artery calcifications. Post  median sternotomy. Subpleural reticulation in both lower lobes, mild. No pleural fluid or consolidation. Hepatobiliary: Postcholecystectomy. Intra and extrahepatic biliary ductal dilatation with common bile duct measuring 2 cm at porta hepatis. Possible rounded 10 mm filling defect in the distal common bile duct at the duodenal insertion. No discrete focal hepatic lesion. Pancreas: Mild parenchymal atrophy. Probable 10 mm pancreatic cyst in the body (image 34 series 3) versus less likely volume averaging. No ductal dilatation or inflammation. Spleen: Normal in size without focal abnormality. Adrenals/Urinary Tract: Normal adrenal glands. Bilateral perinephric edema, symmetric. Multiple small bilateral low-density renal lesions, majority of which are too small to characterize. 17 mm exophytic lesion from the lower left kidney has Hounsfield units of 70 and is  consistent with a benign hemorrhagic cyst. No significant excretion on delayed phase imaging suggesting renal dysfunction. Urinary bladder is physiologically distended without wall thickening. Stomach/Bowel: Stomach physiologically distended. No small bowel inflammation, wall thickening or obstruction. Appendix is surgically absent. Enteric sutures in the ascending colon. Umbilical hernia with neck 7.0 cm contains nonobstructed noninflamed transverse colon. No colonic inflammation or wall thickening. No significant diverticular disease. Vascular/Lymphatic: Calcified and noncalcified atheromatous plaque in the abdominal aorta. Focal aneurysmal dilatation is day inferior to the renal arteries measuring up to 3.8 cm. No evidence of aortic rupture. Moderate to advanced iliac atherosclerosis and tortuosity. No acute vascular findings. No enlarged abdominal or pelvic lymph nodes. Reproductive: Prominent prostate gland spanning 5.5 cm with mild mass effect on the bladder base. Other: No ascites.  No intra-abdominal abscess or free air. Musculoskeletal: Multilevel degenerative change in the spine. There are no acute or suspicious osseous abnormalities. IMPRESSION: 1. Post cholecystectomy. Intra and extrahepatic biliary dilatation with a 10 mm filling defect in the distal common bile duct, possible choledocholithiasis. Recommend ERCP. MRCP could be considered if patient is able tolerate breath hold technique. 2. No evidence pancreatic inflammation or pancreatic ductal dilatation. Possible 10 mm pancreatic cyst. This is likely incidental, however incompletely characterized. The decision to pursue further imaging should be anchored to patient's overall health and perferences. Recommend follow up pre and post contrast MRI/MRCP or pancreatic protocol CT in 2 years. This recommendation follows ACR consensus guidelines: Management of Incidental Pancreatic Cysts: A White Paper of the ACR Incidental Findings Committee. J Am Coll  Radiol 8921;19:417-408. 3. Umbilical hernia containing nonobstructed noninflamed transverse colon. 4. Bilateral renal cysts including a hemorrhagic cyst in the left kidney. 5. Aortic atherosclerosis with focal aneurysmal dilatation of the infrarenal aorta, 3.8 cm. Recommend followup by ultrasound in 2 years. This recommendation follows ACR consensus guidelines: White Paper of the ACR Incidental Findings Committee II on Vascular Findings. J Am Coll Radiol 2013; 10:789-794. Aortic Atherosclerosis (ICD10-I70.0). Aortic aneurysm NOS (ICD10-I71.9). Electronically Signed   By: Jeb Levering M.D.   On: 10/15/2017 22:50    ROS:  As stated above in the HPI otherwise negative.  Blood pressure (!) 153/71, pulse 63, temperature 98.6 F (37 C), temperature source Oral, resp. rate (!) 21, height 6' (1.829 m), weight 99.8 kg (220 lb), SpO2 96 %.    PE: Gen: NAD, Alert and Oriented HEENT:  [place]/AT, EOMI Neck: Supple, no LAD Lungs: CTA Bilaterally CV: RRR without M/G/R ABM: Soft, NTND, +BS Ext: No C/C/E  Assessment/Plan: 1) Gallstone pancreatitis. 2) Choledocholithiasis. 3) CAD.   Surprisingly the CT scan does not show any significant pancreatitis.  He does require an ERCP for further treatment to resolve his condition.  This was most likely the source of his noncardiac chest  pain.  He is feeling well at this time.  Currently he is not experiencing any pain, but he is using has used pain medications.  Plan: 1) ERCP with stone extraction.  Jaysen Wey D 10/16/2017, 10:50 AM

## 2017-10-16 NOTE — Progress Notes (Signed)
The patient was admitted early this AM after midnight and H and P has been reviewed and I am in current Agreement with the Assessment and Plan done by Dr. Cheri Rous. Additional changes to the Plan of Care have been made accordingly. The patient is an obese 84 year old Caucasian male with a PMH of CAD s/p CAG and PCI Stenting, Hx of Colon Cancer, Chronic Combined Systolic and Diastolic CHF, CKD Stage 3, GERD, HLD, Gout, OSA on CPAP, Hx of Hyperkalemia, Thrombocytopenia and other comorbids who presented to [facility] ED with a cc of Abdominal Pain. He was worked up and found to have Gallstone Pancreatitis with CT Abd/Pelvis showing Intra and extrahepatic biliary dilatation with a 10 mm filling defect in the distal common bile duct, possible choledocholithiasis. Gastroenterology Dr. Benson Norway was consulted as [facility] was off, and Dr. Benson Norway recommending ERCP and it will likely be done tomorrow AM. Patient's LFT's remain elevated and will repeat Lipase level in AM. For now will keep the patient NPO except meds and sips and continue gentle IVF rehydration with LR at 50 mL/hr x20 hours. Will continue to Monitor patient's volume status closely as well as monitor patient's clinical response to intervention. Will repeat Blood work in AM.

## 2017-10-17 ENCOUNTER — Encounter (HOSPITAL_COMMUNITY)
Admission: EM | Disposition: A | Discharge status: Home / Self Care | Payer: Self-pay | Source: Home / Self Care | Attending: Internal Medicine

## 2017-10-17 ENCOUNTER — Inpatient Hospital Stay (HOSPITAL_COMMUNITY): Payer: Medicare HMO | Admitting: Anesthesiology

## 2017-10-17 ENCOUNTER — Inpatient Hospital Stay (HOSPITAL_COMMUNITY): Payer: Medicare HMO

## 2017-10-17 ENCOUNTER — Encounter (HOSPITAL_COMMUNITY): Payer: Self-pay | Admitting: *Deleted

## 2017-10-17 DIAGNOSIS — E119 Type 2 diabetes mellitus without complications: Secondary | ICD-10-CM

## 2017-10-17 DIAGNOSIS — K839 Disease of biliary tract, unspecified: Secondary | ICD-10-CM

## 2017-10-17 DIAGNOSIS — K859 Acute pancreatitis without necrosis or infection, unspecified: Secondary | ICD-10-CM

## 2017-10-17 DIAGNOSIS — I5042 Chronic combined systolic (congestive) and diastolic (congestive) heart failure: Secondary | ICD-10-CM

## 2017-10-17 DIAGNOSIS — Z9049 Acquired absence of other specified parts of digestive tract: Secondary | ICD-10-CM

## 2017-10-17 DIAGNOSIS — K8050 Calculus of bile duct without cholangitis or cholecystitis without obstruction: Secondary | ICD-10-CM

## 2017-10-17 DIAGNOSIS — Z794 Long term (current) use of insulin: Secondary | ICD-10-CM

## 2017-10-17 DIAGNOSIS — E785 Hyperlipidemia, unspecified: Secondary | ICD-10-CM

## 2017-10-17 DIAGNOSIS — N183 Chronic kidney disease, stage 3 (moderate): Secondary | ICD-10-CM

## 2017-10-17 DIAGNOSIS — I10 Essential (primary) hypertension: Secondary | ICD-10-CM

## 2017-10-17 HISTORY — PX: ERCP: SHX5425

## 2017-10-17 LAB — COMPREHENSIVE METABOLIC PANEL
ALT: 214 U/L — ABNORMAL HIGH (ref 17–63)
AST: 153 U/L — ABNORMAL HIGH (ref 15–41)
Albumin: 3.4 g/dL — ABNORMAL LOW (ref 3.5–5.0)
Alkaline Phosphatase: 153 U/L — ABNORMAL HIGH (ref 38–126)
Anion gap: 6 (ref 5–15)
BUN: 30 mg/dL — ABNORMAL HIGH (ref 6–20)
CO2: 26 mmol/L (ref 22–32)
Calcium: 8.6 mg/dL — ABNORMAL LOW (ref 8.9–10.3)
Chloride: 107 mmol/L (ref 101–111)
Creatinine, Ser: 1.82 mg/dL — ABNORMAL HIGH (ref 0.61–1.24)
GFR calc Af Amer: 38 mL/min — ABNORMAL LOW (ref >60)
GFR calc non Af Amer: 33 mL/min — ABNORMAL LOW (ref >60)
Glucose, Bld: 131 mg/dL — ABNORMAL HIGH (ref 65–99)
Potassium: 4.4 mmol/L (ref 3.5–5.1)
Sodium: 139 mmol/L (ref 135–145)
Total Bilirubin: 2 mg/dL — ABNORMAL HIGH (ref 0.3–1.2)
Total Protein: 6.6 g/dL (ref 6.5–8.1)

## 2017-10-17 LAB — GLUCOSE, CAPILLARY
Glucose-Capillary: 126 mg/dL — ABNORMAL HIGH (ref 65–99)
Glucose-Capillary: 126 mg/dL — ABNORMAL HIGH (ref 65–99)
Glucose-Capillary: 163 mg/dL — ABNORMAL HIGH (ref 65–99)
Glucose-Capillary: 335 mg/dL — ABNORMAL HIGH (ref 65–99)

## 2017-10-17 LAB — LIPASE, BLOOD: Lipase: 131 U/L — ABNORMAL HIGH (ref 11–51)

## 2017-10-17 LAB — MAGNESIUM: Magnesium: 1.7 mg/dL (ref 1.7–2.4)

## 2017-10-17 LAB — CBC WITH DIFFERENTIAL/PLATELET
Basophils Absolute: 0 10*3/uL (ref 0.0–0.1)
Basophils Relative: 0 %
Eosinophils Absolute: 0.3 10*3/uL (ref 0.0–0.7)
Eosinophils Relative: 5 %
HCT: 39.5 % (ref 39.0–52.0)
Hemoglobin: 11.9 g/dL — ABNORMAL LOW (ref 13.0–17.0)
Lymphocytes Relative: 19 %
Lymphs Abs: 1.3 10*3/uL (ref 0.7–4.0)
MCH: 28.8 pg (ref 26.0–34.0)
MCHC: 30.1 g/dL (ref 30.0–36.0)
MCV: 95.6 fL (ref 78.0–100.0)
Monocytes Absolute: 0.7 10*3/uL (ref 0.1–1.0)
Monocytes Relative: 10 %
Neutro Abs: 4.4 10*3/uL (ref 1.7–7.7)
Neutrophils Relative %: 66 %
Platelets: 76 10*3/uL — ABNORMAL LOW (ref 150–400)
RBC: 4.13 MIL/uL — ABNORMAL LOW (ref 4.22–5.81)
RDW: 15.4 % (ref 11.5–15.5)
WBC: 6.8 10*3/uL (ref 4.0–10.5)

## 2017-10-17 LAB — PHOSPHORUS: Phosphorus: 3.3 mg/dL (ref 2.5–4.6)

## 2017-10-17 SURGERY — ERCP, WITH INTERVENTION IF INDICATED
Anesthesia: General

## 2017-10-17 MED ORDER — ONDANSETRON HCL 4 MG/2ML IJ SOLN
INTRAMUSCULAR | Status: DC | PRN
  Administered 2017-10-17: 4 mg via INTRAVENOUS

## 2017-10-17 MED ORDER — SODIUM CHLORIDE 0.9 % IV SOLN
INTRAVENOUS | Status: DC | PRN
  Administered 2017-10-17: 100 mL

## 2017-10-17 MED ORDER — DEXAMETHASONE SODIUM PHOSPHATE 10 MG/ML IJ SOLN
INTRAMUSCULAR | Status: DC | PRN
  Administered 2017-10-17: 10 mg via INTRAVENOUS

## 2017-10-17 MED ORDER — ROCURONIUM BROMIDE 100 MG/10ML IV SOLN
INTRAVENOUS | Status: DC | PRN
  Administered 2017-10-17: 50 mg via INTRAVENOUS

## 2017-10-17 MED ORDER — GLYCOPYRROLATE 0.2 MG/ML IJ SOLN
INTRAMUSCULAR | Status: DC | PRN
  Administered 2017-10-17: 0.2 mg via INTRAVENOUS

## 2017-10-17 MED ORDER — EPHEDRINE SULFATE-NACL 50-0.9 MG/10ML-% IV SOSY
PREFILLED_SYRINGE | INTRAVENOUS | Status: DC | PRN
  Administered 2017-10-17: 10 mg via INTRAVENOUS
  Administered 2017-10-17: 5 mg via INTRAVENOUS

## 2017-10-17 MED ORDER — FENTANYL CITRATE (PF) 100 MCG/2ML IJ SOLN
INTRAMUSCULAR | Status: AC
  Filled 2017-10-17: qty 2

## 2017-10-17 MED ORDER — GLUCAGON HCL RDNA (DIAGNOSTIC) 1 MG IJ SOLR
INTRAMUSCULAR | Status: AC
  Filled 2017-10-17: qty 1

## 2017-10-17 MED ORDER — INDOMETHACIN 50 MG RE SUPP
RECTAL | Status: AC
  Filled 2017-10-17: qty 2

## 2017-10-17 MED ORDER — LIDOCAINE HCL (CARDIAC) 20 MG/ML IV SOLN
INTRAVENOUS | Status: DC | PRN
  Administered 2017-10-17: 60 mg via INTRAVENOUS

## 2017-10-17 MED ORDER — FENTANYL CITRATE (PF) 100 MCG/2ML IJ SOLN
INTRAMUSCULAR | Status: DC | PRN
  Administered 2017-10-17: 50 ug via INTRAVENOUS

## 2017-10-17 MED ORDER — GLUCAGON HCL RDNA (DIAGNOSTIC) 1 MG IJ SOLR
INTRAMUSCULAR | Status: DC | PRN
  Administered 2017-10-17: .5 mg via INTRAVENOUS

## 2017-10-17 MED ORDER — IOPAMIDOL (ISOVUE-300) INJECTION 61%
INTRAVENOUS | Status: AC
  Filled 2017-10-17: qty 50

## 2017-10-17 MED ORDER — PANTOPRAZOLE SODIUM 40 MG PO TBEC
40.0000 mg | DELAYED_RELEASE_TABLET | Freq: Every day | ORAL | Status: DC
  Administered 2017-10-17 – 2017-10-18 (×2): 40 mg via ORAL
  Filled 2017-10-17 (×2): qty 1

## 2017-10-17 MED ORDER — INDOMETHACIN 50 MG RE SUPP
RECTAL | Status: DC | PRN
  Administered 2017-10-17: 100 mg via RECTAL

## 2017-10-17 MED ORDER — PROPOFOL 10 MG/ML IV BOLUS
INTRAVENOUS | Status: DC | PRN
  Administered 2017-10-17: 120 mg via INTRAVENOUS

## 2017-10-17 MED ORDER — SUGAMMADEX SODIUM 200 MG/2ML IV SOLN
INTRAVENOUS | Status: DC | PRN
  Administered 2017-10-17: 200 mg via INTRAVENOUS

## 2017-10-17 MED ORDER — INDOMETHACIN 50 MG RE SUPP: 100.0000 mg | Freq: Once | RECTAL | Status: DC

## 2017-10-17 NOTE — Transfer of Care (Signed)
Immediate Anesthesia Transfer of Care Note  Patient: Scott [name]  Procedure(s) Performed: ENDOSCOPIC RETROGRADE CHOLANGIOPANCREATOGRAPHY (ERCP) (N/A )  Patient Location: Endoscopy Unit  Anesthesia Type:General  Level of Consciousness: awake, alert  and oriented  Airway & Oxygen Therapy: Patient Spontanous Breathing and Patient connected to nasal cannula oxygen  Post-op Assessment: Report given to RN and Post -op Vital signs reviewed and stable  Post vital signs: Reviewed and stable  Last Vitals:  Vitals:   10/17/17 0603 10/17/17 1045  BP: (!) 125/47 (!) 160/62  Pulse: (!) 53 (!) 53  Resp: 16 14  Temp: 36.6 C 36.7 C  SpO2: 98% 93%    Last Pain:  Vitals:   10/17/17 1045  TempSrc: Oral  PainSc:          Complications: No apparent anesthesia complications

## 2017-10-17 NOTE — Progress Notes (Signed)
Patient refused CPAP at this time.

## 2017-10-17 NOTE — Op Note (Signed)
[facility] Patient Name: Scott [name] Procedure Date : 10/17/2017 MRN: [id number] Attending MD: Docia Chuck. Henrene Pastor , MD Date of Birth: 18-Sep-1933 CSN: [id number] Age: 84 Admit Type: Inpatient Procedure:                ERCP, with biliary sphincterotomy and common duct                            stone removal Indications:              Biliary dilation on Computed Tomogram Scan, Bile                            duct stone on Computed Tomogram Scan, Acute                            pancreatitis Providers:                Docia Chuck. Henrene Pastor, MD, Elna Breslow, RN, Alan Mulder, Technician Referring MD:             [facility] Medicines:                General Anesthesia Complications:            No immediate complications. Estimated Blood Loss:     Estimated blood loss: none. Procedure:                Pre-Anesthesia Assessment:                           - Prior to the procedure, a History and Physical                            was performed, and patient medications and                            allergies were reviewed. The patient is competent.                            The risks and benefits of the procedure and the                            sedation options and risks were discussed with the                            patient. All questions were answered and informed                            consent was obtained. Patient identification and                            proposed procedure were verified by the physician.                            Mental  Status Examination: alert and oriented.                            Airway Examination: normal oropharyngeal airway and                            neck mobility. Respiratory Examination: clear to                            auscultation. CV Examination: normal. Prophylactic                            Antibiotics: The patient does not require                            prophylactic antibiotics. Prior  Anticoagulants: The                            patient has taken no previous anticoagulant or                            antiplatelet agents. ASA Grade Assessment: III - A                            patient with severe systemic disease. After                            reviewing the risks and benefits, the patient was                            deemed in satisfactory condition to undergo the                            procedure. The anesthesia plan was to use moderate                            sedation / analgesia (conscious sedation).                            Immediately prior to administration of medications,                            the patient was re-assessed for adequacy to receive                            sedatives. The heart rate, respiratory rate, oxygen                            saturations, blood pressure, adequacy of pulmonary                            ventilation, and response to care were monitored  throughout the procedure. The physical status of                            the patient was re-assessed after the procedure.                           After obtaining informed consent, the scope was                            passed under direct vision. Throughout the                            procedure, the patient's blood pressure, pulse, and                            oxygen saturations were monitored continuously. The                            [id number] scope was introduced through the                            mouth, and used to inject contrast into and used to                            inject contrast into the bile duct. The ERCP was                            accomplished without difficulty. The patient                            tolerated the procedure well. Scope In: Scope Out: Findings:      The esophagus was successfully intubated under direct vision. The scope       was advanced to a normal major papilla in the descending  duodenum       without detailed examination of the pharynx, larynx and associated       structures, and upper GI tract. The upper GI tract was grossly normal.       The bile duct was deeply cannulated with the traction (standard)       sphincterotome. Contrast was injected. I personally interpreted the bile       duct images. Opacification of the entire biliary tree except for the       gallbladder was successful. The common bile duct contained one stone,       which was 10 mm in diameter. The entire biliary tree was diffusely       dilated. The largest diameter was 20 mm. A cholecystectomy had been       performed. A 0.035 inch straight standard wire was passed into the       biliary tree. Biliary sphincterotomy was made with a traction (standard)       sphincterotome using ERBE electrocautery. There was no       post-sphincterotomy bleeding. The biliary tree was swept with a 20 mm       balloon starting at the bifurcation. All stones were removed. No stones  remained. Excellent drainage. There was no injection or manipulation of       the pancreatic duct. Impression:               - The entire biliary tree was dilated.                           - The patient has had a cholecystectomy.                           - Choledocholithiasis was found.                           - A biliary sphincterotomy was performed.                           - The biliary tree was swept , and stone material                            extracted. Recommendation:           1. Standard post ERCP observation                           2. Clear liquid diet                           3. Stop antibiotics after next dose                           4. If doing well tomorrow may go home.                           5. Outpatient GI follow-up with Dr. Loletha Carrow as needed Procedure Code(s):        --- Professional ---                           [phone], Endoscopic retrograde                            cholangiopancreatography  (ERCP); with removal of                            calculi/debris from biliary/pancreatic duct(s)                           43262, Endoscopic retrograde                            cholangiopancreatography (ERCP); with                            sphincterotomy/papillotomy Diagnosis Code(s):        --- Professional ---                           K83.8, Other specified diseases of biliary tract  Z90.49, Acquired absence of other specified parts                            of digestive tract                           K80.50, Calculus of bile duct without cholangitis                            or cholecystitis without obstruction                           K85.90, Acute pancreatitis without necrosis or                            infection, unspecified CPT copyright 2016 American Medical Association. All rights reserved. The codes documented in this report are preliminary and upon coder review may  be revised to meet current compliance requirements. Docia Chuck. Henrene Pastor, MD 10/17/2017 12:30:46 PM This report has been signed electronically. Number of Addenda: 0

## 2017-10-17 NOTE — Anesthesia Procedure Notes (Signed)
Procedure Name: Intubation Date/Time: 10/17/2017 11:42 AM Performed by: Candis Shine, CRNA Pre-anesthesia Checklist: Patient identified, Emergency Drugs available, Suction available and Patient being monitored Patient Re-evaluated:Patient Re-evaluated prior to induction Oxygen Delivery Method: Circle System Utilized Preoxygenation: Pre-oxygenation with 100% oxygen Induction Type: IV induction Ventilation: Mask ventilation without difficulty and Oral airway inserted - appropriate to patient size Laryngoscope Size: Mac and 4 Grade View: Grade I Tube type: Oral Tube size: 7.5 mm Number of attempts: 1 Airway Equipment and Method: Stylet and Oral airway Placement Confirmation: ETT inserted through vocal cords under direct vision,  positive ETCO2 and breath sounds checked- equal and bilateral Secured at: 22 cm Tube secured with: Tape Dental Injury: Teeth and Oropharynx as per pre-operative assessment

## 2017-10-17 NOTE — Interval H&P Note (Signed)
History and Physical Interval Note:  10/17/2017 11:21 AM  Scott [name]  has presented today for surgery, with the diagnosis of Choledocholithiasis  The various methods of treatment have been discussed with the patient and family. After consideration of risks, benefits and other options for treatment, the patient has consented to  Procedure(s): ENDOSCOPIC RETROGRADE CHOLANGIOPANCREATOGRAPHY (ERCP) (N/A) as a surgical intervention .  The patient's history has been reviewed, patient examined, no change in status, stable for surgery.  I have reviewed the patient's chart and labs.  Questions were answered to the patient's satisfaction.    Case reviewed with Dr. Benson Norway. Patient personally seen and examined at bedside this morning. Acute biliary pancreatitis with radiographic evidence of choledocholithiasis. For ERCP with sphincterotomy and stone extraction.The nature of the procedure, as well as the risks, benefits, and alternatives were carefully and thoroughly reviewed with the patient. Ample time for discussion and questions allowed. The patient understood, was satisfied, and agreed to proceed.   Scott [name]

## 2017-10-17 NOTE — Progress Notes (Signed)
PROGRESS NOTE    Scott [name]  XTG:[id number] DOB: 09-28-1933 DOA: 10/15/2017 PCP: Reubin Milan, MD   Brief Narrative:  The patient is an obese 84 year old Caucasian male with a PMH of CAD s/p CAG and PCI Stenting, Hx of Colon Cancer, Chronic Combined Systolic and Diastolic CHF, CKD Stage 3, GERD, HLD, Gout, OSA on CPAP, Hx of Hyperkalemia, Thrombocytopenia and other comorbids who presented to [facility] ED with a cc of Abdominal Pain. He was worked up and found to have Gallstone Pancreatitis with CT Abd/Pelvis showing Intra and extrahepatic biliary dilatation with a 10 mm filling defect in the distal common bile duct, possible choledocholithiasis. Gastroenterology Dr. Benson Norway was consulted as [facility] was off, and Dr. Benson Norway recommending ERCP and it was done by Dr. Henrene Pastor today Patient's LFT's remain elevated but are trending down and repeat Lipase level much improved. He was  given gentle IVF rehydration with LR at 50 mL/hr x20 hours and now able to be on Clear Liquid Diet after procedure. Will repeat blood work in AM and if improving possibly D/C home.  Assessment & Plan:   Active Problems:   Hypertension   Dyslipidemia   Diabetes mellitus, type II, insulin dependent (HCC)   CKD (chronic kidney disease) stage 3, GFR 30-59 ml/min (HCC)   Chronic combined systolic and diastolic heart failure (HCC)   Pancreatitis   Choledocholithiasis   Biliary Pancreatitis, likely from Choledocholithiasis s/p ERCP with Biliary Sphincterotomy and CBD Stone Removal  -While abdominal pain and elevated lipase suggest acute pancreatitis the CT showing no evidence of pancreatic inflammation raises the question as to whether this is truly the driving force to his symptoms.  -His abdominal pain likely related to biliary stones in CBD; which while less common in the presence of prior cholecystectomy, still possible. -GI consulted and took the patient for ERCP -Given IV Fluid Rehydration and now D/C'd -Lipase Level  went from >10,000 to 131 -AST trending down  -ALT Trending Down -T Bil Trending Down -Pain control with po oxycodone and as needed breakthrough IV opioids with Hydromorphone -Given Cipro for Surgical Prophylaxis  Abnormal LFT's -As Above -Trending Down -Repeat CMP in AM   Hyperbilirubinemia -Trending Down -Repeat CMP in AM  CAD and Chronic Systolic HF -Continue ASA, statin, BB; holding furosemide and ARB not in HF at this time  Insulin dependent DM -Reports taking 35 u of 70/30 BID, -Given basal insulin at 20 units daily and rapid acting corrective factor with Sensitive Novolog SSI q 6 hours while NPO -Continue to monitor CBG's -CBG ranging from 126-163  Hx of Gout -Continue home Allopurinol 100 mg po BID  GERD -Continue PPI with Pantoprazole 40 mg po Daily  Peripheral Neuropathy -Continue home Gabapentin  300 mg po BID  Regular ETOH use  CKD Stage 3 -BUN/Cr stable -Continue to Monitor  DVT prophylaxis: SCD's  Code Status: FULL CODE Family Communication: No family present at bedside Disposition Plan: Possible D/C Home in AM  Consultants:   Gastroenterology Dr. Henrene Pastor   Procedures:  ERCP Findings:      The esophagus was successfully intubated under direct vision. The scope       was advanced to a normal major papilla in the descending duodenum       without detailed examination of the pharynx, larynx and associated       structures, and upper GI tract. The upper GI tract was grossly normal.       The bile duct was deeply  cannulated with the traction (standard)       sphincterotome. Contrast was injected. I personally interpreted the bile       duct images. Opacification of the entire biliary tree except for the       gallbladder was successful. The common bile duct contained one stone,       which was 10 mm in diameter. The entire biliary tree was diffusely       dilated. The largest diameter was 20 mm. A cholecystectomy had been       performed. A 0.035  inch straight standard wire was passed into the       biliary tree. Biliary sphincterotomy was made with a traction (standard)       sphincterotome using ERBE electrocautery. There was no       post-sphincterotomy bleeding. The biliary tree was swept with a 20 mm       balloon starting at the bifurcation. All stones were removed. No stones       remained. Excellent drainage. There was no injection or manipulation of       the pancreatic duct. Impression:               - The entire biliary tree was dilated.                           - The patient has had a cholecystectomy.                           - Choledocholithiasis was found.                           - A biliary sphincterotomy was performed.                           - The biliary tree was swept , and stone material                            extracted.   Antimicrobials:  Anti-infectives (From admission, onward)   Start     Dose/Rate Route Frequency Ordered Stop   10/16/17 1900  ciprofloxacin (CIPRO) IVPB 400 mg     400 mg 200 mL/hr over 60 Minutes Intravenous Every 12 hours 10/16/17 1852       Subjective: Seen and examined at bedside after ERCP and stated he was feeling good. Tolerated Clears ok and denied any Abdominal pain. No CP or SOB.   Objective: Vitals:   10/17/17 1236 10/17/17 1240 10/17/17 1250 10/17/17 1325  BP: (!) 155/60 (!) 147/59 (!) 142/65 (!) 175/81  Pulse: 65 66 62 (!) 56  Resp: (!) 25 15 13 15   Temp: 98.3 F (36.8 C)   98.1 F (36.7 C)  TempSrc: Oral   Oral  SpO2: 94% 94% 93% 93%  Weight:      Height:        Intake/Output Summary (Last 24 hours) at 10/17/2017 1918 Last data filed at 10/17/2017 1900 Gross per 24 hour  Intake 1710 ml  Output -  Net 1710 ml   Filed Weights   10/15/17 1747  Weight: 99.8 kg (220 lb)   Examination: Physical Exam:  Constitutional: WN/WD Caucasian Male in NAD and appears calm Eyes: Lids and conjunctivae normal, sclerae anicteric  ENMT: External Ears,  Nose appear  normal. Grossly normal hearing. Mucous membranes are moist. Neck: Appears normal, supple, no cervical masses, normal ROM, no appreciable thyromegaly, no JVD Respiratory: Clear to auscultation bilaterally, no wheezing, rales, rhonchi or crackles. Normal respiratory effort and patient is not tachypenic. No accessory muscle use.  Cardiovascular: RRR, no murmurs / rubs / gallops. S1 and S2 auscultated. No extremity edema. Abdomen: Soft, non-tender, non-distended. No masses palpated. No appreciable hepatosplenomegaly. Bowel sounds positive x4.  GU: Deferred. Musculoskeletal: No clubbing / cyanosis of digits/nails. No joint deformity upper and lower extremities. Good ROM, no contractures Skin: No rashes, lesions, ulcers on a limited skin eval. No induration; Warm and dry.  Neurologic: CN 2-12 grossly intact with no focal deficits. Sensation intact in all 4 Extremities, Romberg sign and cerebellar reflexes not assessed.  Psychiatric: Normal judgment and insight. Alert and oriented x 3. Normal mood and appropriate affect.   Data Reviewed: I have personally reviewed following labs and imaging studies  CBC: Recent Labs  Lab 10/15/17 1745 10/16/17 0420 10/17/17 0859  WBC 10.9* 8.6 6.8  NEUTROABS  --   --  4.4  HGB 13.7 12.0* 11.9*  HCT 43.2 38.9* 39.5  MCV 93.7 93.1 95.6  PLT 102* 78* 76*   Basic Metabolic Panel: Recent Labs  Lab 10/15/17 1745 10/16/17 0420 10/17/17 0859  NA 134* 133* 139  K 4.8 5.5* 4.4  CL 97* 104 107  CO2 28 23 26   GLUCOSE 234* 284* 131*  BUN 34* 35* 30*  CREATININE 1.84* 1.75* 1.82*  CALCIUM 9.4 8.2* 8.6*  MG  --   --  1.7  PHOS  --   --  3.3   GFR: Estimated Creatinine Clearance: 38.3 mL/min (A) (by C-G formula based on SCr of 1.82 mg/dL (H)). Liver Function Tests: Recent Labs  Lab 10/15/17 1745 10/16/17 0420 10/17/17 0859  AST 255* 353* 153*  ALT 188* 301* 214*  ALKPHOS 185* 164* 153*  BILITOT 2.3* 3.4* 2.0*  PROT 7.7 6.1* 6.6  ALBUMIN 4.1 3.2* 3.4*    Recent Labs  Lab 10/15/17 1745 10/17/17 0859  LIPASE >10,000* 131*   No results for input(s): AMMONIA in the last 168 hours. Coagulation Profile: No results for input(s): INR, PROTIME in the last 168 hours. Cardiac Enzymes: No results for input(s): CKTOTAL, CKMB, CKMBINDEX, TROPONINI in the last 168 hours. BNP (last 3 results) No results for input(s): PROBNP in the last 8760 hours. HbA1C: No results for input(s): HGBA1C in the last 72 hours. CBG: Recent Labs  Lab 10/16/17 1411 10/16/17 2032 10/17/17 0207 10/17/17 0817 10/17/17 1411  GLUCAP 185* 154* 126* 126* 163*   Lipid Profile: No results for input(s): CHOL, HDL, LDLCALC, TRIG, CHOLHDL, LDLDIRECT in the last 72 hours. Thyroid Function Tests: No results for input(s): TSH, T4TOTAL, FREET4, T3FREE, THYROIDAB in the last 72 hours. Anemia Panel: No results for input(s): VITAMINB12, FOLATE, FERRITIN, TIBC, IRON, RETICCTPCT in the last 72 hours. Sepsis Labs: No results for input(s): PROCALCITON, LATICACIDVEN in the last 168 hours.  No results found for this or any previous visit (from the past 240 hour(s)).   Radiology Studies: Ct Abdomen Pelvis W Contrast  Result Date: 10/15/2017 CLINICAL DATA:  Epigastric pain. Pancreatitis suspected, atypical presentation/labs EXAM: CT ABDOMEN AND PELVIS WITH CONTRAST TECHNIQUE: Multidetector CT imaging of the abdomen and pelvis was performed using the standard protocol following bolus administration of intravenous contrast. CONTRAST:  28mL ISOVUE-300 IOPAMIDOL (ISOVUE-300) INJECTION 61% COMPARISON:  None available. FINDINGS: Lower chest: Cardiomegaly with coronary artery calcifications.  Post median sternotomy. Subpleural reticulation in both lower lobes, mild. No pleural fluid or consolidation. Hepatobiliary: Postcholecystectomy. Intra and extrahepatic biliary ductal dilatation with common bile duct measuring 2 cm at porta hepatis. Possible rounded 10 mm filling defect in the distal common  bile duct at the duodenal insertion. No discrete focal hepatic lesion. Pancreas: Mild parenchymal atrophy. Probable 10 mm pancreatic cyst in the body (image 34 series 3) versus less likely volume averaging. No ductal dilatation or inflammation. Spleen: Normal in size without focal abnormality. Adrenals/Urinary Tract: Normal adrenal glands. Bilateral perinephric edema, symmetric. Multiple small bilateral low-density renal lesions, majority of which are too small to characterize. 17 mm exophytic lesion from the lower left kidney has Hounsfield units of 70 and is consistent with a benign hemorrhagic cyst. No significant excretion on delayed phase imaging suggesting renal dysfunction. Urinary bladder is physiologically distended without wall thickening. Stomach/Bowel: Stomach physiologically distended. No small bowel inflammation, wall thickening or obstruction. Appendix is surgically absent. Enteric sutures in the ascending colon. Umbilical hernia with neck 7.0 cm contains nonobstructed noninflamed transverse colon. No colonic inflammation or wall thickening. No significant diverticular disease. Vascular/Lymphatic: Calcified and noncalcified atheromatous plaque in the abdominal aorta. Focal aneurysmal dilatation is day inferior to the renal arteries measuring up to 3.8 cm. No evidence of aortic rupture. Moderate to advanced iliac atherosclerosis and tortuosity. No acute vascular findings. No enlarged abdominal or pelvic lymph nodes. Reproductive: Prominent prostate gland spanning 5.5 cm with mild mass effect on the bladder base. Other: No ascites.  No intra-abdominal abscess or free air. Musculoskeletal: Multilevel degenerative change in the spine. There are no acute or suspicious osseous abnormalities. IMPRESSION: 1. Post cholecystectomy. Intra and extrahepatic biliary dilatation with a 10 mm filling defect in the distal common bile duct, possible choledocholithiasis. Recommend ERCP. MRCP could be considered if  patient is able tolerate breath hold technique. 2. No evidence pancreatic inflammation or pancreatic ductal dilatation. Possible 10 mm pancreatic cyst. This is likely incidental, however incompletely characterized. The decision to pursue further imaging should be anchored to patient's overall health and perferences. Recommend follow up pre and post contrast MRI/MRCP or pancreatic protocol CT in 2 years. This recommendation follows ACR consensus guidelines: Management of Incidental Pancreatic Cysts: A White Paper of the ACR Incidental Findings Committee. J Am Coll Radiol 1245;80:998-338. 3. Umbilical hernia containing nonobstructed noninflamed transverse colon. 4. Bilateral renal cysts including a hemorrhagic cyst in the left kidney. 5. Aortic atherosclerosis with focal aneurysmal dilatation of the infrarenal aorta, 3.8 cm. Recommend followup by ultrasound in 2 years. This recommendation follows ACR consensus guidelines: White Paper of the ACR Incidental Findings Committee II on Vascular Findings. J Am Coll Radiol 2013; 10:789-794. Aortic Atherosclerosis (ICD10-I70.0). Aortic aneurysm NOS (ICD10-I71.9). Electronically Signed   By: Jeb Levering M.D.   On: 10/15/2017 22:50   Dg Ercp Biliary & Pancreatic Ducts  Result Date: 10/17/2017 CLINICAL DATA:  Stones common duct. EXAM: ERCP TECHNIQUE: Multiple spot images obtained with the fluoroscopic device and submitted for interpretation post-procedure. FLUOROSCOPY TIME:  Fluoroscopy Time:  6 minutes and 13 seconds Number of Acquired Spot Images: 12 COMPARISON:  10/15/2017 FINDINGS: Dilated common bile duct. Biliary system was cannulated and a wire was advanced into the intrahepatic ducts. Probable filling defects in the proximal common bile duct region. Evidence for a balloon sweep for stone removal. No large filling defects on the final image. IMPRESSION: Dilated biliary system appears to be secondary to choledocholithiasis. Evidence for stone extraction. These  images were submitted for radiologic  interpretation only. Please see the procedural report for the amount of contrast and the fluoroscopy time utilized. Electronically Signed   By: Markus Daft M.D.   On: 10/17/2017 15:30   Scheduled Meds: . allopurinol  100 mg Oral BID  . aspirin EC  81 mg Oral Daily  . atorvastatin  80 mg Oral Daily  . carvedilol  12.5 mg Oral BID WC  . cholecalciferol  1,000 Units Oral Daily  . gabapentin  300 mg Oral BID  . indomethacin  100 mg Rectal Once  . insulin aspart  0-5 Units Subcutaneous QHS  . insulin aspart  0-9 Units Subcutaneous Q6H  . insulin glargine  20 Units Subcutaneous Daily  . pantoprazole  40 mg Oral Daily  . sodium chloride flush  3 mL Intravenous Q12H  . tamsulosin  0.4 mg Oral QPM   Continuous Infusions: . ciprofloxacin 400 mg (10/17/17 1816)    LOS: 1 day   Kerney Elbe, DO [facility] Pager [phone]  If 7PM-7AM, please contact night-coverage [email or web address] Password [facility] 10/17/2017, 7:18 PM

## 2017-10-17 NOTE — Anesthesia Preprocedure Evaluation (Addendum)
Anesthesia Evaluation  Patient identified by MRN, date of birth, ID band Patient awake    Reviewed: Allergy & Precautions, H&P , NPO status , Patient's Chart, lab work & pertinent test results, reviewed documented beta blocker date and time   Airway Mallampati: II  TM Distance: >3 FB Neck ROM: Full    Dental no notable dental hx. (+) Upper Dentures, Dental Advisory Given   Pulmonary sleep apnea and Continuous Positive Airway Pressure Ventilation , former smoker,    Pulmonary exam normal breath sounds clear to auscultation       Cardiovascular hypertension, Pt. on medications and Pt. on home beta blockers + CAD, + Past MI, + Cardiac Stents, + CABG, +CHF and + DOE   Rhythm:Regular Rate:Normal     Neuro/Psych negative neurological ROS  negative psych ROS   GI/Hepatic Neg liver ROS, GERD  Medicated and Controlled,  Endo/Other  diabetes, Insulin Dependent  Renal/GU Renal disease  negative genitourinary   Musculoskeletal  (+) Arthritis , Osteoarthritis,    Abdominal   Peds  Hematology negative hematology ROS (+)   Anesthesia Other Findings   Reproductive/Obstetrics negative OB ROS                            Anesthesia Physical Anesthesia Plan  ASA: III  Anesthesia Plan: General   Post-op Pain Management:    Induction: Intravenous  PONV Risk Score and Plan: 2 and Treatment may vary due to age or medical condition, Ondansetron and Dexamethasone  Airway Management Planned: Oral ETT  Additional Equipment:   Intra-op Plan:   Post-operative Plan: Extubation in OR  Informed Consent: I have reviewed the patients History and Physical, chart, labs and discussed the procedure including the risks, benefits and alternatives for the proposed anesthesia with the patient or authorized representative who has indicated his/her understanding and acceptance.   Dental advisory given  Plan Discussed  with: CRNA  Anesthesia Plan Comments:         Anesthesia Quick Evaluation

## 2017-10-18 ENCOUNTER — Encounter (HOSPITAL_COMMUNITY): Payer: Self-pay | Admitting: Internal Medicine

## 2017-10-18 DIAGNOSIS — D696 Thrombocytopenia, unspecified: Secondary | ICD-10-CM

## 2017-10-18 DIAGNOSIS — K851 Biliary acute pancreatitis without necrosis or infection: Principal | ICD-10-CM

## 2017-10-18 LAB — MAGNESIUM: Magnesium: 1.8 mg/dL (ref 1.7–2.4)

## 2017-10-18 LAB — CBC WITH DIFFERENTIAL/PLATELET
Basophils Absolute: 0 10*3/uL (ref 0.0–0.1)
Basophils Relative: 0 %
Eosinophils Absolute: 0 10*3/uL (ref 0.0–0.7)
Eosinophils Relative: 0 %
HCT: 36.9 % — ABNORMAL LOW (ref 39.0–52.0)
Hemoglobin: 11.4 g/dL — ABNORMAL LOW (ref 13.0–17.0)
Lymphocytes Relative: 7 %
Lymphs Abs: 0.6 10*3/uL — ABNORMAL LOW (ref 0.7–4.0)
MCH: 28.9 pg (ref 26.0–34.0)
MCHC: 30.9 g/dL (ref 30.0–36.0)
MCV: 93.4 fL (ref 78.0–100.0)
Monocytes Absolute: 0.5 10*3/uL (ref 0.1–1.0)
Monocytes Relative: 7 %
Neutro Abs: 6.6 10*3/uL (ref 1.7–7.7)
Neutrophils Relative %: 86 %
Platelets: 73 10*3/uL — ABNORMAL LOW (ref 150–400)
RBC: 3.95 MIL/uL — ABNORMAL LOW (ref 4.22–5.81)
RDW: 14.7 % (ref 11.5–15.5)
WBC: 7.7 10*3/uL (ref 4.0–10.5)

## 2017-10-18 LAB — COMPREHENSIVE METABOLIC PANEL
ALT: 151 U/L — ABNORMAL HIGH (ref 17–63)
AST: 78 U/L — ABNORMAL HIGH (ref 15–41)
Albumin: 2.9 g/dL — ABNORMAL LOW (ref 3.5–5.0)
Alkaline Phosphatase: 141 U/L — ABNORMAL HIGH (ref 38–126)
Anion gap: 8 (ref 5–15)
BUN: 35 mg/dL — ABNORMAL HIGH (ref 6–20)
CO2: 24 mmol/L (ref 22–32)
Calcium: 8.4 mg/dL — ABNORMAL LOW (ref 8.9–10.3)
Chloride: 102 mmol/L (ref 101–111)
Creatinine, Ser: 2.02 mg/dL — ABNORMAL HIGH (ref 0.61–1.24)
GFR calc Af Amer: 34 mL/min — ABNORMAL LOW (ref >60)
GFR calc non Af Amer: 29 mL/min — ABNORMAL LOW (ref >60)
Glucose, Bld: 283 mg/dL — ABNORMAL HIGH (ref 65–99)
Potassium: 4.7 mmol/L (ref 3.5–5.1)
Sodium: 134 mmol/L — ABNORMAL LOW (ref 135–145)
Total Bilirubin: 1.5 mg/dL — ABNORMAL HIGH (ref 0.3–1.2)
Total Protein: 6.3 g/dL — ABNORMAL LOW (ref 6.5–8.1)

## 2017-10-18 LAB — GLUCOSE, CAPILLARY
Glucose-Capillary: 288 mg/dL — ABNORMAL HIGH (ref 65–99)
Glucose-Capillary: 306 mg/dL — ABNORMAL HIGH (ref 65–99)

## 2017-10-18 LAB — PHOSPHORUS: Phosphorus: 3.9 mg/dL (ref 2.5–4.6)

## 2017-10-18 MED ORDER — PANTOPRAZOLE SODIUM 40 MG PO TBEC: 40.0000 mg | DELAYED_RELEASE_TABLET | Freq: Every day | ORAL | 0 refills | Status: DC

## 2017-10-18 NOTE — Progress Notes (Signed)
Removed IV, provided discharge education/instructions, all questions and concerns addressed, Pt not in distress, discharged home with belongings accompanied by friend.

## 2017-10-18 NOTE — Anesthesia Postprocedure Evaluation (Signed)
Anesthesia Post Note  Patient: Scott [name]  Procedure(s) Performed: ENDOSCOPIC RETROGRADE CHOLANGIOPANCREATOGRAPHY (ERCP) (N/A )     Patient location during evaluation: PACU Anesthesia Type: General Level of consciousness: awake and alert Pain management: pain level controlled Vital Signs Assessment: post-procedure vital signs reviewed and stable Respiratory status: spontaneous breathing, nonlabored ventilation, respiratory function stable and patient connected to nasal cannula oxygen Cardiovascular status: blood pressure returned to baseline and stable Postop Assessment: no apparent nausea or vomiting Anesthetic complications: no    Last Vitals:  Vitals:   10/18/17 0037 10/18/17 0633  BP: (!) 153/58 (!) 141/50  Pulse: (!) 55 (!) 53  Resp: 16   Temp: 36.5 C 36.5 C  SpO2: 93% 98%    Last Pain:  Vitals:   10/18/17 0633  TempSrc: Oral  PainSc:                  Shalanda Brogden,W. EDMOND

## 2017-10-18 NOTE — Plan of Care (Signed)
Patient's skin is clean, dry, and intact.

## 2017-10-18 NOTE — Discharge Summary (Signed)
Physician Discharge Summary  TEMITAYO COVALT HYI:[id number] DOB: 1933/08/28 DOA: 10/15/2017  PCP: Reubin Milan, MD  Admit date: 10/15/2017 Discharge date: 10/18/2017  Admitted From: Home Disposition:  Home  Recommendations for Outpatient Follow-up:  1. Follow up with PCP in 1-2 weeks and repeat LFT's in 1 week 2. Follow up with Gastroenterology Dr. Loletha Carrow as needed 3. Please obtain CMP/CBC, Mag, Phos in one week 4. Please follow up on the following pending results:  Home Health: No Equipment/Devices: None   Discharge Condition: Stable CODE STATUS: FULL CODE Diet recommendation: Heart Healthy Carb Modified Diet  Brief/Interim Summary: The patient is an obese 84 year old Caucasian male with a PMH of CAD s/p CAG and PCI Stenting, Hx of Colon Cancer, Chronic Combined Systolic and Diastolic CHF, CKD Stage 3, GERD, HLD, Gout, OSA on CPAP, Hx of Hyperkalemia, Thrombocytopenia and other comorbids who presented to [facility] ED with a cc of Abdominal Pain. He was worked up and found to have Gallstone Pancreatitis with CT Abd/Pelvis showingIntra and extrahepatic biliary dilatation with a 10 mm filling defect in the distal common bile duct, possible choledocholithiasis.Gastroenterology Dr. Benson Norway was consulted as [facility] was off, and Dr. Benson Norway recommending ERCP and it was done by Dr. Henrene Pastor today Patient's LFT's remain elevated but are trending down and repeat Lipase level much improved. He was  given gentle IVF rehydration with LR at 50 mL/hr x20 hours and now able to be on Clear Liquid Diet after procedure and advanced to Heart Healthy Carb Modified by GI without issues. Patient improved and was deemed medically stable to D/C Home and will need to follow up with PCP and Gastroenterology as needed.   Discharge Diagnoses:  Active Problems:   Hypertension   Dyslipidemia   Diabetes mellitus, type II, insulin dependent (HCC)   CKD (chronic kidney disease) stage 3, GFR 30-59 ml/min (HCC)   Chronic combined  systolic and diastolic heart failure (HCC)   Pancreatitis   Choledocholithiasis  Biliary Pancreatitis, likely from Choledocholithiasis s/p ERCP with Biliary Sphincterotomy and CBD Stone Removal POD 1 -While abdominal pain and elevated lipase suggest acute pancreatitis the CT showing no evidence of pancreatic inflammation raises the question as to whether this is truly the driving force to his symptoms.  -His abdominal pain likely related to biliary stones in CBD; which while less common in the presence of prior cholecystectomy, still possible. -GI consulted and took the patient for ERCP 10/17/16 -Given IV Fluid Rehydration and now D/C'd -Lipase Level went from >10,000 to 131 -AST trending down and now 78 -ALT Trending Down and now 151 -T Bil Trending Down and now 1.5 -Pain control with po oxycodone and as needed breakthrough IV opioids with Hydromorphone while hospitalized  -Given Cipro for Surgical Prophylaxis but now D/C'd -Diet advanced to Heart Healthy/Carb Modified Diet by Gastroenterology  -Repeat CMP in 1 week to Monitor LFT's and follow up with GI Dr. Loletha Carrow as an outpatient as needed.   Abnormal LFT's -As Above -Trending Down -Repeat CMP as an outpatient   Hyperbilirubinemia -Trending Down; T Bili went from 3.4 -> 1.5 -Repeat CMP as an outpatient   CAD and Chronic Systolic HF -Continue ASA, statin, BB; Held furosemide and ARB for ERCP  -Not in HF at this time  Insulin dependent DM -Reports taking 35 u of 70/30 BID and can continue at D/C -Given Basal insulin at 20 units daily and rapid acting corrective factor with Sensitive Novolog SSI q 6 hours while Hospitalized and while NPO -  Continue to monitor CBG's -CBG ranging from 163-306  Hx of Gout -Continue home Allopurinol 100 mg po BID  GERD -Continue PPI with Pantoprazole 40 mg po BID  Peripheral Neuropathy -Continue home Gabapentin  300 mg po BID  Regular ETOH use -Counseled about cutting back   CKD  Stage 3 -BUN/Cr stable at 35/2.02; Slightly up from yesterday at 30/1.82 -Continue to Monitor and follow up as an outpatient to repeat CMP  Discharge Instructions  Discharge Instructions    Call MD for:  difficulty breathing, headache or visual disturbances   Complete by:  As directed    Call MD for:  extreme fatigue   Complete by:  As directed    Call MD for:  hives   Complete by:  As directed    Call MD for:  persistant dizziness or light-headedness   Complete by:  As directed    Call MD for:  persistant nausea and vomiting   Complete by:  As directed    Call MD for:  redness, tenderness, or signs of infection (pain, swelling, redness, odor or green/yellow discharge around incision site)   Complete by:  As directed    Call MD for:  severe uncontrolled pain   Complete by:  As directed    Call MD for:  temperature >100.4   Complete by:  As directed    Diet - low sodium heart healthy   Complete by:  As directed    Diet Carb Modified   Complete by:  As directed    Discharge instructions   Complete by:  As directed    Follow up with PCP as an outpatient and GI as needed. Take all medication as prescribed. If symptoms change or worsen please return to the ED for evaluation.   Increase activity slowly   Complete by:  As directed      Allergies as of 10/18/2017   No Known Allergies     Medication List    TAKE these medications   allopurinol 100 MG tablet Commonly known as:  ZYLOPRIM Take 2 tablets (200 mg total) by mouth daily. What changed:    how much to take  when to take this   aspirin 81 MG EC tablet Take 81 mg by mouth daily.   atorvastatin 80 MG tablet Commonly known as:  LIPITOR Take 80 mg by mouth every morning.   bismuth subsalicylate 998 PJ/82NK suspension Commonly known as:  PEPTO BISMOL Take 30 mLs by mouth every 6 (six) hours as needed for indigestion.   carvedilol 12.5 MG tablet Commonly known as:  COREG Take 1 tablet (12.5 mg total) by mouth 2  (two) times daily with a meal.   cholecalciferol 1000 units tablet Commonly known as:  VITAMIN D Take 1,000 Units by mouth daily.   colchicine 0.6 MG tablet Take 1 tablet (0.6 mg total) by mouth 3 (three) times daily as needed. For gout pain. NEED OV. What changed:    how much to take  reasons to take this  additional instructions   fluticasone 50 MCG/ACT nasal spray Commonly known as:  FLONASE Place 2 sprays into both nostrils daily as needed for allergies or rhinitis.   furosemide 80 MG tablet Commonly known as:  LASIX Take 40 mg by mouth 2 (two) times daily.   gabapentin 300 MG capsule Commonly known as:  NEURONTIN Take 300 mg by mouth 2 (two) times daily.   insulin aspart protamine- aspart (70-30) 100 UNIT/ML injection Commonly known as:  NOVOLOG  MIX 70/30 Inject 35 Units into the skin 2 (two) times daily.   isosorbide mononitrate 30 MG 24 hr tablet Commonly known as:  IMDUR Take 1 tablet (30 mg total) by mouth daily.   latanoprost 0.005 % ophthalmic solution Commonly known as:  XALATAN Place 1 drop into both eyes at bedtime.   losartan 100 MG tablet Commonly known as:  COZAAR Take 100 mg by mouth daily.   multivitamin tablet Take 1 tablet by mouth daily.   nitroGLYCERIN 0.4 MG SL tablet Commonly known as:  NITROSTAT Place 1 tablet (0.4 mg total) under the tongue every 5 (five) minutes as needed for chest pain (up to 3 doses).   pantoprazole 40 MG tablet Commonly known as:  PROTONIX Take 1 tablet (40 mg total) by mouth 2 (two) times daily.   tamsulosin 0.4 MG Caps capsule Commonly known as:  FLOMAX Take 0.4 mg by mouth every evening.       No Known Allergies  Consultations: -Gastroenterology  Procedures/Studies: Dg Chest 2 View  Result Date: 10/15/2017 CLINICAL DATA:  Epigastric pain. EXAM: CHEST  2 VIEW COMPARISON:  05/21/2017 FINDINGS: The cardiopericardial silhouette is within normal limits for size. The lungs are clear without focal  pneumonia, edema, pneumothorax or pleural effusion. Interstitial markings are diffusely coarsened with chronic features. The visualized bony structures of the thorax are intact. IMPRESSION: Stable. Chronic interstitial coarsening without acute cardiopulmonary findings. Electronically Signed   By: Misty Stanley M.D.   On: 10/15/2017 18:54   Ct Abdomen Pelvis W Contrast  Result Date: 10/15/2017 CLINICAL DATA:  Epigastric pain. Pancreatitis suspected, atypical presentation/labs EXAM: CT ABDOMEN AND PELVIS WITH CONTRAST TECHNIQUE: Multidetector CT imaging of the abdomen and pelvis was performed using the standard protocol following bolus administration of intravenous contrast. CONTRAST:  60mL ISOVUE-300 IOPAMIDOL (ISOVUE-300) INJECTION 61% COMPARISON:  None available. FINDINGS: Lower chest: Cardiomegaly with coronary artery calcifications. Post median sternotomy. Subpleural reticulation in both lower lobes, mild. No pleural fluid or consolidation. Hepatobiliary: Postcholecystectomy. Intra and extrahepatic biliary ductal dilatation with common bile duct measuring 2 cm at porta hepatis. Possible rounded 10 mm filling defect in the distal common bile duct at the duodenal insertion. No discrete focal hepatic lesion. Pancreas: Mild parenchymal atrophy. Probable 10 mm pancreatic cyst in the body (image 34 series 3) versus less likely volume averaging. No ductal dilatation or inflammation. Spleen: Normal in size without focal abnormality. Adrenals/Urinary Tract: Normal adrenal glands. Bilateral perinephric edema, symmetric. Multiple small bilateral low-density renal lesions, majority of which are too small to characterize. 17 mm exophytic lesion from the lower left kidney has Hounsfield units of 70 and is consistent with a benign hemorrhagic cyst. No significant excretion on delayed phase imaging suggesting renal dysfunction. Urinary bladder is physiologically distended without wall thickening. Stomach/Bowel: Stomach  physiologically distended. No small bowel inflammation, wall thickening or obstruction. Appendix is surgically absent. Enteric sutures in the ascending colon. Umbilical hernia with neck 7.0 cm contains nonobstructed noninflamed transverse colon. No colonic inflammation or wall thickening. No significant diverticular disease. Vascular/Lymphatic: Calcified and noncalcified atheromatous plaque in the abdominal aorta. Focal aneurysmal dilatation is day inferior to the renal arteries measuring up to 3.8 cm. No evidence of aortic rupture. Moderate to advanced iliac atherosclerosis and tortuosity. No acute vascular findings. No enlarged abdominal or pelvic lymph nodes. Reproductive: Prominent prostate gland spanning 5.5 cm with mild mass effect on the bladder base. Other: No ascites.  No intra-abdominal abscess or free air. Musculoskeletal: Multilevel degenerative change in the spine. There are  no acute or suspicious osseous abnormalities. IMPRESSION: 1. Post cholecystectomy. Intra and extrahepatic biliary dilatation with a 10 mm filling defect in the distal common bile duct, possible choledocholithiasis. Recommend ERCP. MRCP could be considered if patient is able tolerate breath hold technique. 2. No evidence pancreatic inflammation or pancreatic ductal dilatation. Possible 10 mm pancreatic cyst. This is likely incidental, however incompletely characterized. The decision to pursue further imaging should be anchored to patient's overall health and perferences. Recommend follow up pre and post contrast MRI/MRCP or pancreatic protocol CT in 2 years. This recommendation follows ACR consensus guidelines: Management of Incidental Pancreatic Cysts: A White Paper of the ACR Incidental Findings Committee. J Am Coll Radiol 1027;25:366-440. 3. Umbilical hernia containing nonobstructed noninflamed transverse colon. 4. Bilateral renal cysts including a hemorrhagic cyst in the left kidney. 5. Aortic atherosclerosis with focal  aneurysmal dilatation of the infrarenal aorta, 3.8 cm. Recommend followup by ultrasound in 2 years. This recommendation follows ACR consensus guidelines: White Paper of the ACR Incidental Findings Committee II on Vascular Findings. J Am Coll Radiol 2013; 10:789-794. Aortic Atherosclerosis (ICD10-I70.0). Aortic aneurysm NOS (ICD10-I71.9). Electronically Signed   By: Jeb Levering M.D.   On: 10/15/2017 22:50   Dg Ercp Biliary & Pancreatic Ducts  Result Date: 10/17/2017 CLINICAL DATA:  Stones common duct. EXAM: ERCP TECHNIQUE: Multiple spot images obtained with the fluoroscopic device and submitted for interpretation post-procedure. FLUOROSCOPY TIME:  Fluoroscopy Time:  6 minutes and 13 seconds Number of Acquired Spot Images: 12 COMPARISON:  10/15/2017 FINDINGS: Dilated common bile duct. Biliary system was cannulated and a wire was advanced into the intrahepatic ducts. Probable filling defects in the proximal common bile duct region. Evidence for a balloon sweep for stone removal. No large filling defects on the final image. IMPRESSION: Dilated biliary system appears to be secondary to choledocholithiasis. Evidence for stone extraction. These images were submitted for radiologic interpretation only. Please see the procedural report for the amount of contrast and the fluoroscopy time utilized. Electronically Signed   By: Markus Daft M.D.   On: 10/17/2017 15:30    ERCP Findings: The esophagus was successfully intubated under direct vision. The scope  was advanced to a normal major papilla in the descending duodenum  without detailed examination of the pharynx, larynx and associated  structures, and upper GI tract. The upper GI tract was grossly normal.  The bile duct was deeply cannulated with the traction (standard)  sphincterotome. Contrast was injected. I personally interpreted the bile  duct images. Opacification of the entire biliary tree except for the   gallbladder was successful. The common bile duct contained one stone,  which was 10 mm in diameter. The entire biliary tree was diffusely  dilated. The largest diameter was 20 mm. A cholecystectomy had been  performed. A 0.035 inch straight standard wire was passed into the  biliary tree. Biliary sphincterotomy was made with a traction (standard)  sphincterotome using ERBE electrocautery. There was no  post-sphincterotomy bleeding. The biliary tree was swept with a 20 mm  balloon starting at the bifurcation. All stones were removed. No stones  remained. Excellent drainage. There was no injection or manipulation of  the pancreatic duct. Impression: - The entire biliary tree was dilated. - The patient has had a cholecystectomy. - Choledocholithiasis was found. - A biliary sphincterotomy was performed. - The biliary tree was swept , and stone material  extracted.  Subjective: Seen and examined at bedside and stated throat was a little sore after having procedure but was  not bad. Had no issues and denied any abdominal pain, SOB, or CP. Ready to go home.  Discharge Exam: Vitals:   10/18/17 0037 10/18/17 0633  BP: (!) 153/58 (!) 141/50  Pulse: (!) 55 (!) 53  Resp: 16   Temp: 97.7 F (36.5 C) 97.7 F (36.5 C)  SpO2: 93% 98%   Vitals:   10/17/17 1325 10/17/17 2104 10/18/17 0037 10/18/17 0633  BP: (!) 175/81 (!) 162/67 (!) 153/58 (!) 141/50  Pulse: (!) 56 (!) 57 (!) 55 (!) 53  Resp: 15 14 16    Temp: 98.1 F (36.7 C) 97.7 F (36.5 C) 97.7 F (36.5 C) 97.7 F (36.5 C)  TempSrc: Oral Oral Oral Oral  SpO2: 93% 99% 93% 98%  Weight:      Height:       General: Pt is alert, awake, not in acute distress Cardiovascular: RRR, S1/S2 +, no rubs, no gallops Respiratory: Slightly diminished  bilaterally, no wheezing, no rhonchi Abdominal: Soft, NT, ND, bowel sounds +; Has abdominal Hernia noted Extremities: Mild extremity edema, no cyanosis  The results of significant diagnostics from this hospitalization (including imaging, microbiology, ancillary and laboratory) are listed below for reference.    Microbiology: No results found for this or any previous visit (from the past 240 hour(s)).   Labs: BNP (last 3 results) No results for input(s): BNP in the last 8760 hours. Basic Metabolic Panel: Recent Labs  Lab 10/15/17 1745 10/16/17 0420 10/17/17 0859 10/18/17 0702  NA 134* 133* 139 134*  K 4.8 5.5* 4.4 4.7  CL 97* 104 107 102  CO2 28 23 26 24   GLUCOSE 234* 284* 131* 283*  BUN 34* 35* 30* 35*  CREATININE 1.84* 1.75* 1.82* 2.02*  CALCIUM 9.4 8.2* 8.6* 8.4*  MG  --   --  1.7 1.8  PHOS  --   --  3.3 3.9   Liver Function Tests: Recent Labs  Lab 10/15/17 1745 10/16/17 0420 10/17/17 0859 10/18/17 0702  AST 255* 353* 153* 78*  ALT 188* 301* 214* 151*  ALKPHOS 185* 164* 153* 141*  BILITOT 2.3* 3.4* 2.0* 1.5*  PROT 7.7 6.1* 6.6 6.3*  ALBUMIN 4.1 3.2* 3.4* 2.9*   Recent Labs  Lab 10/15/17 1745 10/17/17 0859  LIPASE >10,000* 131*   No results for input(s): AMMONIA in the last 168 hours. CBC: Recent Labs  Lab 10/15/17 1745 10/16/17 0420 10/17/17 0859 10/18/17 0702  WBC 10.9* 8.6 6.8 7.7  NEUTROABS  --   --  4.4 6.6  HGB 13.7 12.0* 11.9* 11.4*  HCT 43.2 38.9* 39.5 36.9*  MCV 93.7 93.1 95.6 93.4  PLT 102* 78* 76* 73*   Cardiac Enzymes: No results for input(s): CKTOTAL, CKMB, CKMBINDEX, TROPONINI in the last 168 hours. BNP: Invalid input(s): POCBNP CBG: Recent Labs  Lab 10/17/17 0817 10/17/17 1411 10/17/17 2108 10/18/17 0217 10/18/17 0757  GLUCAP 126* 163* 335* 288* 306*   D-Dimer No results for input(s): DDIMER in the last 72 hours. Hgb A1c No results for input(s): HGBA1C in the last 72 hours. Lipid Profile No results for input(s): CHOL,  HDL, LDLCALC, TRIG, CHOLHDL, LDLDIRECT in the last 72 hours. Thyroid function studies No results for input(s): TSH, T4TOTAL, T3FREE, THYROIDAB in the last 72 hours.  Invalid input(s): FREET3 Anemia work up No results for input(s): VITAMINB12, FOLATE, FERRITIN, TIBC, IRON, RETICCTPCT in the last 72 hours. Urinalysis    Component Value Date/Time   COLORURINE YELLOW 08/31/2012 1735   APPEARANCEUR CLOUDY (A) 08/31/2012 1735   LABSPEC  1.013 08/31/2012 1735   PHURINE 6.0 08/31/2012 1735   GLUCOSEU NEGATIVE 08/31/2012 1735   HGBUR SMALL (A) 08/31/2012 1735   BILIRUBINUR NEGATIVE 08/31/2012 1735   KETONESUR NEGATIVE 08/31/2012 1735   PROTEINUR NEGATIVE 08/31/2012 1735   UROBILINOGEN 1.0 08/31/2012 1735   NITRITE NEGATIVE 08/31/2012 1735   LEUKOCYTESUR SMALL (A) 08/31/2012 1735   Sepsis Labs Invalid input(s): PROCALCITONIN,  WBC,  LACTICIDVEN Microbiology No results found for this or any previous visit (from the past 240 hour(s)).   Time coordinating discharge: 35 minutes  SIGNED:  Kerney Elbe, DO [facility] 10/19/2017, 2:02 PM Pager [phone]  If 7PM-7AM, please contact night-coverage [email or web address] Password [email or web address]

## 2017-10-18 NOTE — Progress Notes (Signed)
Daily Rounding Note  10/18/2017, 10:56 AM  LOS: 2 days   SUBJECTIVE:   Denies abdominal pain.  Hungry, on clears.  Last BM yesterday.  Wondering when he will go home     OBJECTIVE:         Vital signs in last 24 hours:    Temp:  [97.7 F (36.5 C)-98.3 F (36.8 C)] 97.7 F (36.5 C) (01/08 0037) Pulse Rate:  [53-66] 53 (01/08 0633) Resp:  [13-25] 16 (01/08 0037) BP: (141-175)/(50-81) 141/50 (01/08 0633) SpO2:  [93 %-99 %] 98 % (01/08 0633) Last BM Date: 10/15/17 Filed Weights   10/15/17 1747  Weight: 99.8 kg (220 lb)   General: overweight, moderately unhealthy but not acutely ill looking   Heart: RRR Chest: clear bil.  + cough.  No labored breathing at rest.  Raspy voice Abdomen: protuberant with ventral hernia.  NT.  Active BS.    Extremities: no CCE Neuro/Psych:  Oriented x 3.  No obvious deficits or weakness.  No tremor.    Intake/Output from previous day: 01/07 0701 - 01/08 0700 In: 1710 [P.O.:710; I.V.:400; IV Piggyback:600] Out: -   Intake/Output this shift: No intake/output data recorded.  Lab Results: Recent Labs    10/16/17 0420 10/17/17 0859 10/18/17 0702  WBC 8.6 6.8 7.7  HGB 12.0* 11.9* 11.4*  HCT 38.9* 39.5 36.9*  PLT 78* 76* 73*   BMET Recent Labs    10/16/17 0420 10/17/17 0859 10/18/17 0702  NA 133* 139 134*  K 5.5* 4.4 4.7  CL 104 107 102  CO2 23 26 24   GLUCOSE 284* 131* 283*  BUN 35* 30* 35*  CREATININE 1.75* 1.82* 2.02*  CALCIUM 8.2* 8.6* 8.4*   LFT Recent Labs    10/16/17 0420 10/17/17 0859 10/18/17 0702  PROT 6.1* 6.6 6.3*  ALBUMIN 3.2* 3.4* 2.9*  AST 353* 153* 78*  ALT 301* 214* 151*  ALKPHOS 164* 153* 141*  BILITOT 3.4* 2.0* 1.5*   PT/INR No results for input(s): LABPROT, INR in the last 72 hours. Hepatitis Panel No results for input(s): HEPBSAG, HCVAB, HEPAIGM, HEPBIGM in the last 72 hours.  Studies/Results: Dg Ercp Biliary & Pancreatic Ducts  Result  Date: 10/17/2017 CLINICAL DATA:  Stones common duct. EXAM: ERCP TECHNIQUE: Multiple spot images obtained with the fluoroscopic device and submitted for interpretation post-procedure. FLUOROSCOPY TIME:  Fluoroscopy Time:  6 minutes and 13 seconds Number of Acquired Spot Images: 12 COMPARISON:  10/15/2017 FINDINGS: Dilated common bile duct. Biliary system was cannulated and a wire was advanced into the intrahepatic ducts. Probable filling defects in the proximal common bile duct region. Evidence for a balloon sweep for stone removal. No large filling defects on the final image. IMPRESSION: Dilated biliary system appears to be secondary to choledocholithiasis. Evidence for stone extraction. These images were submitted for radiologic interpretation only. Please see the procedural report for the amount of contrast and the fluoroscopy time utilized. Electronically Signed   By: Markus Daft M.D.   On: 10/17/2017 15:30   Scheduled Meds: . allopurinol  100 mg Oral BID  . aspirin EC  81 mg Oral Daily  . atorvastatin  80 mg Oral Daily  . carvedilol  12.5 mg Oral BID WC  . cholecalciferol  1,000 Units Oral Daily  . gabapentin  300 mg Oral BID  . indomethacin  100 mg Rectal Once  . insulin aspart  0-5 Units Subcutaneous QHS  . insulin aspart  0-9 Units Subcutaneous  Q6H  . insulin glargine  20 Units Subcutaneous Daily  . pantoprazole  40 mg Oral Daily  . sodium chloride flush  3 mL Intravenous Q12H  . tamsulosin  0.4 mg Oral QPM   Continuous Infusions: PRN Meds:.HYDROmorphone (DILAUDID) injection, oxyCODONE  ASSESMENT:   *  Biliary pancreatitis, Choledocholithiasis.  Previous cholecystectomy 10/17/17 ERCP with sphincterotomy and stone removal.   Lipase, LFTs much improved.    *  Thrombocytopenia, non-critical.  Dates back to 2011.      PLAN   *   Advance to CM/HH diet.   [facility] [place] for discharge today.   Daily PPI for GER, has been on this a long time.   OK to continue 81 ASA.   PRN GI follow up with Dr  Loletha Carrow.    Azucena Freed  10/18/2017, 10:56 AM Pager: [phone]  GI ATTENDING  Interval history data reviewed. Patient seen and examined. Agree with interval progress note. Patient doing well post-ERCP with sphincterotomy and common duct stone extraction post cholecystectomy. Liver tests improving. Okay for discharge. Should have repeat LFTs with his PCP in 1 week to assure ongoing improvement/resolution. Please call for questions or problems. Will sign off.  Docia Chuck. Geri Seminole., M.D. [facility] Division of Gastroenterology

## 2017-10-19 DIAGNOSIS — I509 Heart failure, unspecified: Secondary | ICD-10-CM | POA: Diagnosis not present

## 2017-10-19 DIAGNOSIS — I2510 Atherosclerotic heart disease of native coronary artery without angina pectoris: Secondary | ICD-10-CM | POA: Diagnosis not present

## 2017-10-19 DIAGNOSIS — I259 Chronic ischemic heart disease, unspecified: Secondary | ICD-10-CM | POA: Diagnosis not present

## 2017-10-19 DIAGNOSIS — R0602 Shortness of breath: Secondary | ICD-10-CM | POA: Diagnosis not present

## 2017-10-24 ENCOUNTER — Other Ambulatory Visit: Payer: Self-pay | Admitting: *Deleted

## 2017-10-24 NOTE — Patient Outreach (Signed)
[facility]) Care Management  10/24/2017  Scott [name] 12-Jun-1933 [id number]  Transition of Care Referral  Referral Date: 10/21/17 Referral Source: [employer] Date of Discharge: 10/18/17 Discharge Diagnosis: Gallstones Pancreatitis Insurance: [facility]  Outreach attempt # 1 to patient. HIPAA identifiers verified with patient. Patient acknowledged being in the hospital diagnosed with Gallstone Pancreatitis.  Patient believes, he wouldn't have been in the hospital the last 3 times if the doctors would have explored further. He stated, "The doctors were looking for issues with his heart due to his previous heart problems". Patient reported receiving his discharge hospital instructions and understanding it. He stated, he is taking his medications as prescribed. He is not adhering to a heart healthy carb modified diet. Patient stated, he eats out majority of the time. RN CM encouraged patient to order foods that have more nutritional value. Patient agreed to receive EMMI educational material heart healthy foods. Patient reported, he monitors his blood glucose twice a day and his last reading was 194. He reported, his las weight was 215, which he lost 5 pounds in the hospital. Patient stated, hasn't scheduled a follow-up appointment with his primary MD at the [facility] hospital. His last primary MD visit was 4 to 6 months ago. RN CM explained to patient the importance of following up with his primary MD after discharging from the hospital. Patient stated, he will eventually arrange an appointment with his primary MD. [facility] services and benefits explained to patient. Patient stated, "Since the doctors took care of his gallstones; he is doing better". Patient verbalized, "He doesn't need any assistance at this time". Patient is willing to receive [facility] Brochure and EMMI educational materials.  Plan: RN CM will notify [facility] Case Management Assistant regarding case closure.  RN CM advised patient to  contact RN CM for any needs or concerns. RN CM advised patient to alert MD for any changes in conditions.  RN CM will send patient EMMI educational materials.    Lake Bells, RN, BSN, MHA/MSL, [place] Telephonic Care Manager Coordinator [facility] Direct Phone: [phone] Cell Phone: [phone] Toll Free: [phone] Fax: [phone]

## 2017-10-25 ENCOUNTER — Encounter: Payer: Self-pay | Admitting: *Deleted

## 2018-05-05 DIAGNOSIS — L97521 Non-pressure chronic ulcer of other part of left foot limited to breakdown of skin: Secondary | ICD-10-CM | POA: Diagnosis not present

## 2018-05-05 DIAGNOSIS — M79672 Pain in left foot: Secondary | ICD-10-CM | POA: Diagnosis not present

## 2018-05-05 DIAGNOSIS — M19071 Primary osteoarthritis, right ankle and foot: Secondary | ICD-10-CM | POA: Diagnosis not present

## 2018-05-05 DIAGNOSIS — S91109A Unspecified open wound of unspecified toe(s) without damage to nail, initial encounter: Secondary | ICD-10-CM | POA: Diagnosis not present

## 2018-05-05 DIAGNOSIS — M79671 Pain in right foot: Secondary | ICD-10-CM | POA: Diagnosis not present

## 2018-05-05 DIAGNOSIS — L97512 Non-pressure chronic ulcer of other part of right foot with fat layer exposed: Secondary | ICD-10-CM | POA: Diagnosis not present

## 2018-05-12 DIAGNOSIS — L97522 Non-pressure chronic ulcer of other part of left foot with fat layer exposed: Secondary | ICD-10-CM | POA: Diagnosis not present

## 2018-05-12 DIAGNOSIS — L97512 Non-pressure chronic ulcer of other part of right foot with fat layer exposed: Secondary | ICD-10-CM | POA: Diagnosis not present

## 2018-05-26 DIAGNOSIS — L97512 Non-pressure chronic ulcer of other part of right foot with fat layer exposed: Secondary | ICD-10-CM | POA: Diagnosis not present

## 2018-05-26 DIAGNOSIS — L97521 Non-pressure chronic ulcer of other part of left foot limited to breakdown of skin: Secondary | ICD-10-CM | POA: Diagnosis not present

## 2018-06-07 DIAGNOSIS — L97512 Non-pressure chronic ulcer of other part of right foot with fat layer exposed: Secondary | ICD-10-CM | POA: Diagnosis not present

## 2018-06-15 ENCOUNTER — Other Ambulatory Visit: Payer: Self-pay | Admitting: Cardiology

## 2018-06-21 DIAGNOSIS — L97511 Non-pressure chronic ulcer of other part of right foot limited to breakdown of skin: Secondary | ICD-10-CM | POA: Diagnosis not present

## 2018-08-30 ENCOUNTER — Other Ambulatory Visit: Payer: Self-pay | Admitting: Cardiology

## 2018-08-30 NOTE — Telephone Encounter (Signed)
This needs to be filled by primary care  Peter Martinique MD, [facility]

## 2018-09-22 ENCOUNTER — Other Ambulatory Visit (HOSPITAL_COMMUNITY): Payer: Self-pay | Admitting: Cardiology

## 2018-12-18 ENCOUNTER — Other Ambulatory Visit (HOSPITAL_COMMUNITY): Payer: Self-pay | Admitting: Cardiology

## 2018-12-18 DIAGNOSIS — I2510 Atherosclerotic heart disease of native coronary artery without angina pectoris: Secondary | ICD-10-CM | POA: Diagnosis not present

## 2018-12-18 DIAGNOSIS — R062 Wheezing: Secondary | ICD-10-CM | POA: Diagnosis not present

## 2018-12-18 DIAGNOSIS — I259 Chronic ischemic heart disease, unspecified: Secondary | ICD-10-CM | POA: Diagnosis not present

## 2018-12-18 DIAGNOSIS — I509 Heart failure, unspecified: Secondary | ICD-10-CM | POA: Diagnosis not present

## 2018-12-30 ENCOUNTER — Emergency Department (HOSPITAL_COMMUNITY): Payer: Medicare HMO

## 2018-12-30 ENCOUNTER — Other Ambulatory Visit: Payer: Self-pay

## 2018-12-30 ENCOUNTER — Encounter (HOSPITAL_COMMUNITY): Payer: Self-pay

## 2018-12-30 ENCOUNTER — Emergency Department (HOSPITAL_COMMUNITY)
Admission: EM | Admit: 2018-12-30 | Discharge: 2018-12-30 | Disposition: A | Discharge status: Home / Self Care | Payer: Medicare HMO | Attending: Emergency Medicine | Admitting: Emergency Medicine

## 2018-12-30 DIAGNOSIS — Z79899 Other long term (current) drug therapy: Secondary | ICD-10-CM | POA: Insufficient documentation

## 2018-12-30 DIAGNOSIS — I5042 Chronic combined systolic (congestive) and diastolic (congestive) heart failure: Secondary | ICD-10-CM | POA: Insufficient documentation

## 2018-12-30 DIAGNOSIS — I130 Hypertensive heart and chronic kidney disease with heart failure and stage 1 through stage 4 chronic kidney disease, or unspecified chronic kidney disease: Secondary | ICD-10-CM | POA: Insufficient documentation

## 2018-12-30 DIAGNOSIS — K429 Umbilical hernia without obstruction or gangrene: Secondary | ICD-10-CM | POA: Diagnosis not present

## 2018-12-30 DIAGNOSIS — N183 Chronic kidney disease, stage 3 (moderate): Secondary | ICD-10-CM | POA: Diagnosis not present

## 2018-12-30 DIAGNOSIS — Z7982 Long term (current) use of aspirin: Secondary | ICD-10-CM | POA: Insufficient documentation

## 2018-12-30 DIAGNOSIS — Z794 Long term (current) use of insulin: Secondary | ICD-10-CM | POA: Insufficient documentation

## 2018-12-30 DIAGNOSIS — Z87891 Personal history of nicotine dependence: Secondary | ICD-10-CM | POA: Insufficient documentation

## 2018-12-30 DIAGNOSIS — E1122 Type 2 diabetes mellitus with diabetic chronic kidney disease: Secondary | ICD-10-CM | POA: Insufficient documentation

## 2018-12-30 DIAGNOSIS — R1010 Upper abdominal pain, unspecified: Secondary | ICD-10-CM | POA: Diagnosis not present

## 2018-12-30 DIAGNOSIS — R109 Unspecified abdominal pain: Secondary | ICD-10-CM | POA: Diagnosis not present

## 2018-12-30 LAB — TROPONIN I: Troponin I: <0.03 ng/mL (ref <0.03)

## 2018-12-30 LAB — CBC
HCT: 44.5 % (ref 39.0–52.0)
Hemoglobin: 14 g/dL (ref 13.0–17.0)
MCH: 29.7 pg (ref 26.0–34.0)
MCHC: 31.5 g/dL (ref 30.0–36.0)
MCV: 94.5 fL (ref 80.0–100.0)
Platelets: UNDETERMINED 10*3/uL (ref 150–400)
RBC: 4.71 MIL/uL (ref 4.22–5.81)
RDW: 14.9 % (ref 11.5–15.5)
WBC: 9.9 10*3/uL (ref 4.0–10.5)
nRBC: 0 % (ref 0.0–0.2)

## 2018-12-30 LAB — COMPREHENSIVE METABOLIC PANEL
ALT: 26 U/L (ref 0–44)
AST: 38 U/L (ref 15–41)
Albumin: 4.1 g/dL (ref 3.5–5.0)
Alkaline Phosphatase: 74 U/L (ref 38–126)
Anion gap: 11 (ref 5–15)
BUN: 38 mg/dL — ABNORMAL HIGH (ref 8–23)
CO2: 28 mmol/L (ref 22–32)
Calcium: 9.8 mg/dL (ref 8.9–10.3)
Chloride: 99 mmol/L (ref 98–111)
Creatinine, Ser: 1.94 mg/dL — ABNORMAL HIGH (ref 0.61–1.24)
GFR calc Af Amer: 36 mL/min — ABNORMAL LOW (ref >60)
GFR calc non Af Amer: 31 mL/min — ABNORMAL LOW (ref >60)
Glucose, Bld: 141 mg/dL — ABNORMAL HIGH (ref 70–99)
Potassium: 4.5 mmol/L (ref 3.5–5.1)
Sodium: 138 mmol/L (ref 135–145)
Total Bilirubin: 1.2 mg/dL (ref 0.3–1.2)
Total Protein: 7.7 g/dL (ref 6.5–8.1)

## 2018-12-30 LAB — LIPASE, BLOOD: Lipase: 34 U/L (ref 11–51)

## 2018-12-30 NOTE — ED Provider Notes (Signed)
[facility] [place] EMERGENCY DEPARTMENT Provider Note   CSN: [id number] Arrival date & time: 12/30/18  1730    History   Chief Complaint Chief Complaint  Patient presents with   Abdominal Pain    HPI Scott [name] is a 85 y.o. male.     HPI Patient is an 85 year old male with a history of prior cholecystectomy and coronary artery disease status post CABG who presents the emergency department with ongoing upper abdominal pain since 7 AM this morning.  He reports nausea.  Denies vomiting.  History of cholecystectomy as well as a history of common bile duct stone status post colonoscopy in the past.  No vomiting.  No fevers or chills.  No lower abdominal pain.  Reports some radiation of his upper abdominal discomfort into his chest.  No arm or back pain.  No neck or jaw pain.  No shortness of breath.  No fevers.   Past Medical History:  Diagnosis Date   Acute biliary pancreatitis 10/2017   with choledocholithiasis.  ERCP, stone extraction performed   Arthritis    CAD (coronary artery disease)    a. 1997 CABGx3 (VG->RPDA, VG->PLV, LIMA->LAD);  b. 07/2004 PCI VG->PLV (3.5x16 Taxus DES, 3.5x12 Taxus DES); c. 11/2009 PCI: VG->PLV 3.5x12 Promus DES), VG->RPDA (3.5x15 Promus DES); d. 08/2012 Cath: patent grafts; e. 05/2013 Lexiscan MV: no ischemia/infarction, EF 44%.   Cancer [facility]) 2005   colon surgery done   Cataract    Chronic combined systolic and diastolic CHF (congestive heart failure) ([place]) 08/2012   a. EF 50-55% 11/2011; b. 08/2012 - EF 25-30%,  with mild RM, mod TR, mod RAE & LAE, moderate reduced RV systolic function and PASP of 52mmHg. - > cath showed patent grafts & Mod Puml HTN wth elevated PCWP;  c. 01/2014 Echo: EF 35-40%, mod LVH, mod HK, mildly dil LA, mod dil RA, PASP 61mmHg.   CKD (chronic kidney disease), stage III (HCC)    Dyslipidemia    GERD (gastroesophageal reflux disease)    Gout    History of home oxygen therapy    uses oxygen with cpap low  level not sure how many liters   Hyperkalemia    Hypertension    Insulin Dependent Diabetes mellitus    type 2   Myocardial infarction ([place]) 1997   Sleep apnea    Thrombocytopenia ([place]) 2011   dates back to 2011   UTI (lower urinary tract infection) 08/2012   E.coli    Patient Active Problem List   Diagnosis Date Noted   Choledocholithiasis    Pancreatitis 10/16/2017   Midsternal chest pain 05/21/2017   Nausea - without vomiting 01/01/2015   Low grade fever 01/01/2015   DOE (dyspnea on exertion) 01/01/2015   Unstable angina ([place]) 05/12/2013   Chronic combined systolic and diastolic heart failure ([place]) 05/12/2013   CKD (chronic kidney disease) stage 3, GFR 30-59 ml/min (HCC) 09/01/2012   OSA (obstructive sleep apnea) 07/04/2012   Dyspnea 11/05/2011   Diabetes mellitus, type II, insulin dependent ([place]) 04/07/2011   Hypertension    Ischemic cardiomyopathy    Myocardial infarction [facility])    Dyslipidemia    Arteriosclerotic coronary artery disease     Past Surgical History:  Procedure Laterality Date   APPENDECTOMY     BACK SURGERY     15 years ago lower   CARDIAC CATHETERIZATION  2005   stenting of the vein graft to the posterior lateral per -- Dr. Martinique  CARDIAC CATHETERIZATION  2011   showed right coronary, totally occluded LAD and severe stenosis in both saphenous vein graft to the posterior lateral and posterior descending  -- stenting of the proximal portion of the SVG to the posterior lateral branch in 2/11   [place]    (LIMA to LAD, SVG to PDA, SVG to PL)   ERCP N/A 10/17/2017    Irene Shipper, MD;  [facility] ENDOSCOPY; choledocholithiasis, biliary pancreatitis.  s/p sphincterotomy and stone extraction.     ESOPHAGOGASTRODUODENOSCOPY (EGD) WITH PROPOFOL N/A 09/21/2017   Procedure: ESOPHAGOGASTRODUODENOSCOPY (EGD) WITH PROPOFOL;  Surgeon: Doran Stabler, MD;  Location: [facility] ENDOSCOPY;   Service: Gastroenterology;  Laterality: N/A;   EYE SURGERY     ioc for cataract   HERNIA REPAIR     LEFT AND RIGHT HEART CATHETERIZATION WITH CORONARY/GRAFT ANGIOGRAM  09/04/2012   Procedure: LEFT AND RIGHT HEART CATHETERIZATION WITH Beatrix Fetters;  Surgeon: Sherren Mocha, MD;  Location: [facility] CATH LAB;  patent grafts, mod 2ndary pulmonary HTN, elevated LV EDP & PCWP   LEFT HEART CATH AND CORS/GRAFTS ANGIOGRAPHY N/A 06/17/2017   Procedure: LEFT HEART CATH AND CORS/GRAFTS ANGIOGRAPHY;  Surgeon: Martinique, Peter M, MD;  Location: [facility] CV LAB;  Service: Cardiovascular;  Laterality: N/A;   PARTIAL COLECTOMY     cancerous polyps   stent to heart  last done 5 to 6 yrs ago   x 4        Home Medications    Prior to Admission medications   Medication Sig Start Date End Date Taking? Authorizing Provider  PRESCRIPTION MEDICATION CPAP: At bedtime   Yes [provider]  allopurinol (ZYLOPRIM) 100 MG tablet Take 2 tablets (200 mg total) by mouth daily. Patient taking differently: Take 100 mg by mouth 2 (two) times daily.  09/05/12   Hope, Jessica A, PA-C  aspirin 81 MG EC tablet Take 81 mg by mouth daily.      [provider]  atorvastatin (LIPITOR) 80 MG tablet Take 80 mg by mouth every morning.    [provider]  bismuth subsalicylate (PEPTO BISMOL) 262 MG/15ML suspension Take 30 mLs by mouth every 6 (six) hours as needed for indigestion.    [provider]  carvedilol (COREG) 12.5 MG tablet Take 1 tablet (12.5 mg total) by mouth 2 (two) times daily with a meal. 09/05/12   Hope, Jessica A, PA-C  cholecalciferol (VITAMIN D) 1000 units tablet Take 1,000 Units by mouth daily.    [provider]  colchicine 0.6 MG tablet TAKE 1 TABLET(0.6 MG) BY MOUTH THREE TIMES DAILY AS NEEDED FOR GOUT PAIN 06/15/18   Martinique, Peter M, MD  fluticasone [facility]) 50 MCG/ACT nasal spray Place 2 sprays into both nostrils daily as needed for allergies or  rhinitis.    [provider]  furosemide (LASIX) 40 MG tablet TAKE 1/2 TABLETS BY MOUTH 2 TIMES DAILY 12/18/18   Martinique, Peter M, MD  furosemide (LASIX) 80 MG tablet Take 40 mg by mouth 2 (two) times daily.    [provider]  gabapentin (NEURONTIN) 300 MG capsule Take 300 mg by mouth 2 (two) times daily.     [provider]  insulin aspart protamine- aspart (NOVOLOG MIX 70/30) (70-30) 100 UNIT/ML injection Inject 35 Units into the skin 2 (two) times daily.     [provider]  isosorbide mononitrate (IMDUR) 30 MG 24 hr tablet Take 1 tablet (30  mg total) by mouth daily. Patient not taking: Reported on 09/15/2017 05/22/17 05/22/18  Theora Gianotti, NP  latanoprost (XALATAN) 0.005 % ophthalmic solution Place 1 drop into both eyes at bedtime.    [provider]  losartan (COZAAR) 100 MG tablet Take 100 mg by mouth daily.    [provider]  Multiple Vitamin (MULTIVITAMIN) tablet Take 1 tablet by mouth daily.      [provider]  nitroGLYCERIN (NITROSTAT) 0.4 MG SL tablet Place 1 tablet (0.4 mg total) under the tongue every 5 (five) minutes as needed for chest pain (up to 3 doses). 02/20/15   Martinique, Peter M, MD  pantoprazole (PROTONIX) 40 MG tablet Take 1 tablet (40 mg total) by mouth 2 (two) times daily. 06/09/17   Almyra Deforest, PA  tamsulosin (FLOMAX) 0.4 MG CAPS capsule Take 0.4 mg by mouth every evening.     [provider]    Family History Family History  Problem Relation Age of Onset   Coronary artery disease Father    Heart attack Father    Coronary artery disease Brother        1/2 brother    Kidney disease Sister    Cardiomyopathy Mother    Asthma Brother     Social History Social History   Tobacco Use   Smoking status: Former Smoker    Packs/day: 1.50    Years: 35.00    Pack years: 52.50    Types: Cigarettes    Last attempt to quit: 10/12/1983    Years since quitting: 35.2   Smokeless tobacco:  Never Used  Substance Use Topics   Alcohol use: Yes    Alcohol/week: 0.0 standard drinks    Comment: 2-3 drinks 3-4x/wk.   Drug use: No     Allergies   Patient has no known allergies.   Review of Systems Review of Systems  All other systems reviewed and are negative.    Physical Exam Updated Vital Signs BP (!) 157/95 (BP Location: Right Arm)    Pulse 61    Temp 98.6 F (37 C) (Oral)    Resp 17    Ht 6' (1.829 m)    Wt 99.8 kg    SpO2 97%    BMI 29.84 kg/m   Physical Exam Vitals signs and nursing note reviewed.  Constitutional:      Appearance: He is well-developed.  HENT:     Head: Normocephalic and atraumatic.  Neck:     Musculoskeletal: Normal range of motion.  Cardiovascular:     Rate and Rhythm: Normal rate and regular rhythm.     Heart sounds: Normal heart sounds.  Pulmonary:     Effort: Pulmonary effort is normal. No respiratory distress.     Breath sounds: Normal breath sounds.  Abdominal:     General: There is no distension.     Palpations: Abdomen is soft.     Tenderness: There is no abdominal tenderness.  Musculoskeletal: Normal range of motion.  Skin:    General: Skin is warm and dry.  Neurological:     Mental Status: He is alert and oriented to person, place, and time.  Psychiatric:        Judgment: Judgment normal.      ED Treatments / Results  Labs (all labs ordered are listed, but only abnormal results are displayed) Labs Reviewed  COMPREHENSIVE METABOLIC PANEL - Abnormal; Notable for the following components:      Result Value   Glucose, Bld 141 (*)  BUN 38 (*)    Creatinine, Ser 1.94 (*)    GFR calc non Af Amer 31 (*)    GFR calc Af Amer 36 (*)    All other components within normal limits  CBC  LIPASE, BLOOD  TROPONIN I    EKG EKG Interpretation  Date/Time:  Saturday December 30 2018 17:37:04 EDT Ventricular Rate:  61 PR Interval:    QRS Duration: 111 QT Interval:  487 QTC Calculation: 491 R Axis:   80 Text  Interpretation:  Sinus rhythm Prolonged PR interval Borderline low voltage, extremity leads Borderline repolarization abnormality Borderline prolonged QT interval No significant change was found Confirmed by Jola Schmidt [id number]) on 12/30/2018 5:57:39 PM   Radiology Ct Abdomen Pelvis Wo Contrast  Result Date: 12/30/2018 CLINICAL DATA:  Upper abdominal pain since 0700 hours this morning, rated at 5/10 EXAM: CT ABDOMEN AND PELVIS WITHOUT CONTRAST TECHNIQUE: Multidetector CT imaging of the abdomen and pelvis was performed following the standard protocol without IV contrast. Sagittal and coronal MPR images reconstructed from axial data set. COMPARISON:  10/15/2017 FINDINGS: Lower chest: Emphysematous changes with peripheral bibasilar interstitial changes, chronic, question fibrosis. No acute infiltrate. Hepatobiliary: New pneumobilia, by history interval ERCP with sphincterotomy gallbladder surgically absent. No focal hepatic mass lesions. Pancreas: Normal appearance Spleen: Normal appearance Adrenals/Urinary Tract: Adrenal glands normal appearance. BILATERAL renal cysts. No additional renal masses or hydronephrosis. Single tiny nonobstructing calculus mid LEFT kidney. Unremarkable ureters and bladder Stomach/Bowel: Prior appendectomy. Extension of segment of transverse colon into a supraumbilical ventral hernia without obstruction. Additional supraumbilical ventral hernias stomach decompressed. Bowel loops otherwise unremarkable. Containing fat Vascular/Lymphatic: Atherosclerotic calcifications aorta, iliac arteries, visceral arteries, coronary arteries. 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 No adenopathy. Reproductive: Prostatic enlargement, gland 5.5 x 4.0 cm image 81. Other: No free air or free fluid.  No acute inflammatory process. Musculoskeletal: Osseous demineralization. Scattered degenerative disc and facet disease changes thoracolumbar spine.  IMPRESSION: Segment of nonobstructed mid transverse colon within a supraumbilical ventral hernia. Additional small fat containing supraumbilical ventral hernias. BILATERAL renal cysts. Prostatic enlargement. No other intra-abdominal or intrapelvic abnormalities. 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 Emphysematous changes with suspect mild bibasilar peripheral pulmonary fibrosis. New pneumobilia consistent with interval ERCP. Aortic Atherosclerosis (ICD10-I70.0). Electronically Signed   By: Lavonia Dana M.D.   On: 12/30/2018 20:21   Dg Abdomen Acute W/chest  Result Date: 12/30/2018 CLINICAL DATA:  Abdominal pain beginning this morning. EXAM: DG ABDOMEN ACUTE W/ 1V CHEST COMPARISON:  CT of the abdomen 10/15/2017. Two-view chest x-ray 10/15/2017. FINDINGS: Heart is enlarged. There is no edema or effusion. No focal airspace disease is present. The bowel gas pattern is normal. Postsurgical changes are present in the right lower quadrant. Scoliosis and degenerative changes are noted in the lower lumbar spine. IMPRESSION: Negative abdominal radiographs.  No acute cardiopulmonary disease. Electronically Signed   By: San Morelle M.D.   On: 12/30/2018 18:59    Procedures Procedures (including critical care time)  Medications Ordered in ED Medications - No data to display   Initial Impression / Assessment and Plan / ED Course  I have reviewed the triage vital signs and the nursing notes.  Pertinent labs & imaging results that were available during my care of the patient were reviewed by me and considered in my medical decision making (see chart for details).        Baseline renal insufficiency.  No vomiting in  the ER.  Patient does not want any medication for pain.  Doubt ACS.  EKG and troponin negative.  Upper abdominal discomfort is been constant since earlier this morning.  Currently tolerating fluids.  Work-up in the emergency  department including labs and CT scan without acute abnormality.  Discharged home in good condition.  Primary care follow-up.  If his symptoms persist he may benefit from upper GI or endoscopy.  Final Clinical Impressions(s) / ED Diagnoses   Final diagnoses:  Pain of upper abdomen    ED Discharge Orders    None       Jola Schmidt, MD 12/30/18 [id number]

## 2018-12-30 NOTE — ED Triage Notes (Signed)
Pt having 5/10 upper abdominal pain since 7am this morning.

## 2019-01-18 DIAGNOSIS — I509 Heart failure, unspecified: Secondary | ICD-10-CM | POA: Diagnosis not present

## 2019-01-18 DIAGNOSIS — R062 Wheezing: Secondary | ICD-10-CM | POA: Diagnosis not present

## 2019-01-18 DIAGNOSIS — I259 Chronic ischemic heart disease, unspecified: Secondary | ICD-10-CM | POA: Diagnosis not present

## 2019-01-18 DIAGNOSIS — I2510 Atherosclerotic heart disease of native coronary artery without angina pectoris: Secondary | ICD-10-CM | POA: Diagnosis not present

## 2019-02-17 DIAGNOSIS — I2510 Atherosclerotic heart disease of native coronary artery without angina pectoris: Secondary | ICD-10-CM | POA: Diagnosis not present

## 2019-02-17 DIAGNOSIS — I259 Chronic ischemic heart disease, unspecified: Secondary | ICD-10-CM | POA: Diagnosis not present

## 2019-02-17 DIAGNOSIS — I509 Heart failure, unspecified: Secondary | ICD-10-CM | POA: Diagnosis not present

## 2019-02-17 DIAGNOSIS — R062 Wheezing: Secondary | ICD-10-CM | POA: Diagnosis not present

## 2019-03-20 DIAGNOSIS — I509 Heart failure, unspecified: Secondary | ICD-10-CM | POA: Diagnosis not present

## 2019-03-20 DIAGNOSIS — I259 Chronic ischemic heart disease, unspecified: Secondary | ICD-10-CM | POA: Diagnosis not present

## 2019-03-20 DIAGNOSIS — R062 Wheezing: Secondary | ICD-10-CM | POA: Diagnosis not present

## 2019-03-20 DIAGNOSIS — I2510 Atherosclerotic heart disease of native coronary artery without angina pectoris: Secondary | ICD-10-CM | POA: Diagnosis not present

## 2019-04-19 DIAGNOSIS — I259 Chronic ischemic heart disease, unspecified: Secondary | ICD-10-CM | POA: Diagnosis not present

## 2019-04-19 DIAGNOSIS — R062 Wheezing: Secondary | ICD-10-CM | POA: Diagnosis not present

## 2019-04-19 DIAGNOSIS — I509 Heart failure, unspecified: Secondary | ICD-10-CM | POA: Diagnosis not present

## 2019-04-19 DIAGNOSIS — I2510 Atherosclerotic heart disease of native coronary artery without angina pectoris: Secondary | ICD-10-CM | POA: Diagnosis not present

## 2019-05-20 DIAGNOSIS — I509 Heart failure, unspecified: Secondary | ICD-10-CM | POA: Diagnosis not present

## 2019-05-20 DIAGNOSIS — R062 Wheezing: Secondary | ICD-10-CM | POA: Diagnosis not present

## 2019-05-20 DIAGNOSIS — I259 Chronic ischemic heart disease, unspecified: Secondary | ICD-10-CM | POA: Diagnosis not present

## 2019-05-20 DIAGNOSIS — I2510 Atherosclerotic heart disease of native coronary artery without angina pectoris: Secondary | ICD-10-CM | POA: Diagnosis not present

## 2019-05-30 ENCOUNTER — Other Ambulatory Visit: Payer: Self-pay

## 2019-05-30 ENCOUNTER — Ambulatory Visit (HOSPITAL_COMMUNITY)
Admission: EM | Admit: 2019-05-30 | Discharge: 2019-05-30 | Disposition: A | Discharge status: Home / Self Care | Payer: Medicare HMO

## 2019-05-30 ENCOUNTER — Encounter (HOSPITAL_COMMUNITY): Payer: Self-pay | Admitting: Emergency Medicine

## 2019-05-30 DIAGNOSIS — I509 Heart failure, unspecified: Secondary | ICD-10-CM | POA: Diagnosis not present

## 2019-05-30 DIAGNOSIS — I130 Hypertensive heart and chronic kidney disease with heart failure and stage 1 through stage 4 chronic kidney disease, or unspecified chronic kidney disease: Secondary | ICD-10-CM | POA: Diagnosis not present

## 2019-05-30 DIAGNOSIS — L84 Corns and callosities: Secondary | ICD-10-CM

## 2019-05-30 DIAGNOSIS — Z794 Long term (current) use of insulin: Secondary | ICD-10-CM | POA: Diagnosis not present

## 2019-05-30 DIAGNOSIS — E1159 Type 2 diabetes mellitus with other circulatory complications: Secondary | ICD-10-CM | POA: Diagnosis not present

## 2019-05-30 NOTE — ED Provider Notes (Signed)
[place]    CSN: [id number] Arrival date & time: 05/30/19  1206     History   Chief Complaint Chief Complaint  Patient presents with  . Foot Pain    HPI Scott [name] is a 85 y.o. male.   Patient is a 85 year old male with possible history of pancreatitis, arthritis, CAD, cancer, cataract, CHF, CKD, GERD, gout, hypertension, insulin-dependent diabetic, MI, sleep apnea, UTI.  He is complaining of right plantar foot pain over the past 2 weeks.  The pain is worse with walking.  He has hardened area to the bottom of the foot that he is concerned about.  Reports he has been putting some sort of cream on the area that has seemed to improve.  Unsure of the name of the cream.  Describes as a sharp pain when he walks.  No injury to the foot.  He does have history of gout but reports this feels different.  No fevers, swelling or drainage from the foot. No erythema.   ROS per HPI      Past Medical History:  Diagnosis Date  . Acute biliary pancreatitis 10/2017   with choledocholithiasis.  ERCP, stone extraction performed  . Arthritis   . CAD (coronary artery disease)    a. 1997 CABGx3 (VG->RPDA, VG->PLV, LIMA->LAD);  b. 07/2004 PCI VG->PLV (3.5x16 Taxus DES, 3.5x12 Taxus DES); c. 11/2009 PCI: VG->PLV 3.5x12 Promus DES), VG->RPDA (3.5x15 Promus DES); d. 08/2012 Cath: patent grafts; e. 05/2013 Lexiscan MV: no ischemia/infarction, EF 44%.  . Cancer [facility]) 2005   colon surgery done  . Cataract   . Chronic combined systolic and diastolic CHF (congestive heart failure) ([place]) 08/2012   a. EF 50-55% 11/2011; b. 08/2012 - EF 25-30%,  with mild RM, mod TR, mod RAE & LAE, moderate reduced RV systolic function and PASP of 32mmHg. - > cath showed patent grafts & Mod Puml HTN wth elevated PCWP;  c. 01/2014 Echo: EF 35-40%, mod LVH, mod HK, mildly dil LA, mod dil RA, PASP 3mmHg.  . CKD (chronic kidney disease), stage III ([place])   . Dyslipidemia   . GERD (gastroesophageal reflux disease)   .  Gout   . History of home oxygen therapy    uses oxygen with cpap low level not sure how many liters  . Hyperkalemia   . Hypertension   . Insulin Dependent Diabetes mellitus    type 2  . Myocardial infarction ([place]) 1997  . Sleep apnea   . Thrombocytopenia ([place]) 2011   dates back to 2011  . UTI (lower urinary tract infection) 08/2012   E.coli    Patient Active Problem List   Diagnosis Date Noted  . Choledocholithiasis   . Pancreatitis 10/16/2017  . Midsternal chest pain 05/21/2017  . Nausea - without vomiting 01/01/2015  . Low grade fever 01/01/2015  . DOE (dyspnea on exertion) 01/01/2015  . Unstable angina ([place]) 05/12/2013  . Chronic combined systolic and diastolic heart failure ([place]) 05/12/2013  . CKD (chronic kidney disease) stage 3, GFR 30-59 ml/min ([facility]) 09/01/2012  . OSA (obstructive sleep apnea) 07/04/2012  . Dyspnea 11/05/2011  . Diabetes mellitus, type II, insulin dependent ([place]) 04/07/2011  . Hypertension   . Ischemic cardiomyopathy   . Myocardial infarction ([place])   . Dyslipidemia   . Arteriosclerotic coronary artery disease     Past Surgical History:  Procedure Laterality Date  . APPENDECTOMY    . BACK SURGERY     15 years ago lower  . CARDIAC CATHETERIZATION  2005   stenting of the vein graft to the posterior lateral per -- Dr. Martinique      . CARDIAC CATHETERIZATION  2011   showed right coronary, totally occluded LAD and severe stenosis in both saphenous vein graft to the posterior lateral and posterior descending  -- stenting of the proximal portion of the SVG to the posterior lateral branch in 2/11  . CHOLECYSTECTOMY    . CORONARY ARTERY BYPASS GRAFT  1997    (LIMA to LAD, SVG to PDA, SVG to PL)  . ERCP N/A 10/17/2017    Irene Shipper, MD;  [facility] ENDOSCOPY; choledocholithiasis, biliary pancreatitis.  s/p sphincterotomy and stone extraction.    . ESOPHAGOGASTRODUODENOSCOPY (EGD) WITH PROPOFOL N/A 09/21/2017   Procedure: ESOPHAGOGASTRODUODENOSCOPY (EGD) WITH  PROPOFOL;  Surgeon: Doran Stabler, MD;  Location: [facility] ENDOSCOPY;  Service: Gastroenterology;  Laterality: N/A;  . EYE SURGERY     ioc for cataract  . HERNIA REPAIR    . LEFT AND RIGHT HEART CATHETERIZATION WITH CORONARY/GRAFT ANGIOGRAM  09/04/2012   Procedure: LEFT AND RIGHT HEART CATHETERIZATION WITH Beatrix Fetters;  Surgeon: Sherren Mocha, MD;  Location: [facility] CATH LAB;  patent grafts, mod 2ndary pulmonary HTN, elevated LV EDP & PCWP  . LEFT HEART CATH AND CORS/GRAFTS ANGIOGRAPHY N/A 06/17/2017   Procedure: LEFT HEART CATH AND CORS/GRAFTS ANGIOGRAPHY;  Surgeon: Martinique, Peter M, MD;  Location: [facility] CV LAB;  Service: Cardiovascular;  Laterality: N/A;  . PARTIAL COLECTOMY     cancerous polyps  . stent to heart  last done 5 to 6 yrs ago   x 4       Home Medications    Prior to Admission medications   Medication Sig Start Date End Date Taking? Authorizing Provider  allopurinol (ZYLOPRIM) 100 MG tablet Take 2 tablets (200 mg total) by mouth daily. Patient taking differently: Take 100 mg by mouth 2 (two) times daily.  09/05/12   Hope, Jessica A, PA-C  aspirin 81 MG EC tablet Take 81 mg by mouth at bedtime.     [provider]  atorvastatin (LIPITOR) 80 MG tablet Take 80 mg by mouth daily.     [provider]  bismuth subsalicylate (PEPTO BISMOL) 262 MG/15ML suspension Take 30 mLs by mouth every 6 (six) hours as needed for indigestion.    [provider]  carvedilol (COREG) 12.5 MG tablet Take 1 tablet (12.5 mg total) by mouth 2 (two) times daily with a meal. 09/05/12   Hope, Jessica A, PA-C  Cholecalciferol (VITAMIN D-3) 25 MCG (1000 UT) CAPS Take 1,000 Units by mouth every other day.    [provider]  colchicine 0.6 MG tablet TAKE 1 TABLET(0.6 MG) BY MOUTH THREE TIMES DAILY AS NEEDED FOR GOUT PAIN Patient taking differently: Take 0.6 mg by mouth 3 (three) times daily as needed (for flares).  06/15/18   Martinique, Peter M, MD  fluticasone  [facility]) 50 MCG/ACT nasal spray Place 2 sprays into both nostrils daily as needed for allergies or rhinitis.    [provider]  furosemide (LASIX) 40 MG tablet TAKE 1/2 TABLETS BY MOUTH 2 TIMES DAILY Patient taking differently: Take 40 mg by mouth 2 (two) times daily.  12/18/18   Martinique, Peter M, MD  gabapentin (NEURONTIN) 300 MG capsule Take 300 mg by mouth at bedtime.     [provider]  insulin aspart protamine- aspart (NOVOLOG MIX 70/30) (70-30) 100 UNIT/ML injection Inject 40 Units into the skin 2 (two) times daily  before a meal.     [provider]  isosorbide mononitrate (IMDUR) 30 MG 24 hr tablet Take 1 tablet (30 mg total) by mouth daily. Patient not taking: Reported on 12/30/2018 05/22/17 12/30/18  Theora Gianotti, NP  latanoprost (XALATAN) 0.005 % ophthalmic solution Place 1 drop into both eyes at bedtime.    [provider]  loratadine (CLARITIN) 10 MG tablet Take 10 mg by mouth daily as needed (for seasonal allergies).     [provider]  losartan (COZAAR) 100 MG tablet Take 100 mg by mouth daily.    [provider]  Multiple Vitamin (MULTIVITAMIN) tablet Take 1 tablet by mouth daily.      [provider]  nitroGLYCERIN (NITROSTAT) 0.4 MG SL tablet Place 1 tablet (0.4 mg total) under the tongue every 5 (five) minutes as needed for chest pain (up to 3 doses). Patient taking differently: Place 0.4 mg under the tongue every 5 (five) minutes x 3 doses as needed for chest pain.  02/20/15   Martinique, Peter M, MD  pantoprazole (PROTONIX) 40 MG tablet Take 1 tablet (40 mg total) by mouth 2 (two) times daily. Patient taking differently: Take 40 mg by mouth daily.  06/09/17   Almyra Deforest, PA  PRESCRIPTION MEDICATION CPAP: At bedtime    [provider]  tamsulosin (FLOMAX) 0.4 MG CAPS capsule Take 0.4 mg by mouth every evening.     [provider]    Family History Family History  Problem Relation Age of Onset   . Coronary artery disease Father   . Heart attack Father   . Coronary artery disease Brother        1/2 brother   . Kidney disease Sister   . Cardiomyopathy Mother   . Asthma Brother     Social History Social History   Tobacco Use  . Smoking status: Former Smoker    Packs/day: 1.50    Years: 35.00    Pack years: 52.50    Types: Cigarettes    Quit date: 10/12/1983    Years since quitting: 35.6  . Smokeless tobacco: Never Used  Substance Use Topics  . Alcohol use: Yes    Alcohol/week: 0.0 standard drinks    Comment: 2-3 drinks 3-4x/wk.  . Drug use: No     Allergies   Patient has no known allergies.   Review of Systems Review of Systems   Physical Exam Triage Vital Signs ED Triage Vitals [05/30/19 1236]  Enc Vitals Group     BP (!) 149/84     Pulse Rate 67     Resp 18     Temp 97.9 F (36.6 C)     Temp Source Oral     SpO2 98 %     Weight      Height      Head Circumference      Peak Flow      Pain Score 6     Pain Loc      Pain Edu?      Excl. in [place]?    No data found.  Updated Vital Signs BP (!) 149/84 (BP Location: Right Arm)   Pulse 67   Temp 97.9 F (36.6 C) (Oral)   Resp 18   SpO2 98%   Visual Acuity Right Eye Distance:   Left Eye Distance:   Bilateral Distance:    Right Eye Near:   Left Eye Near:    Bilateral Near:     Physical Exam  Constitutional:      Appearance: Normal appearance.  HENT:     Head: Normocephalic and atraumatic.     Nose: Nose normal.  Eyes:     Conjunctiva/sclera: Conjunctivae normal.  Neck:     Musculoskeletal: Normal range of motion.  Pulmonary:     Effort: Pulmonary effort is normal.  Musculoskeletal: Normal range of motion.       Feet:  Feet:     Comments: Callus to plantar aspect of right foot Mildly tender to touch. No redness, swelling or drainage. No open wounds, or skin breakdown.  Normal color and temperature to the foot. Skin:    General: Skin is warm and dry.  Neurological:     Mental  Status: He is alert.      UC Treatments / Results  Labs (all labs ordered are listed, but only abnormal results are displayed) Labs Reviewed - No data to display  EKG   Radiology No results found.  Procedures Procedures (including critical care time)  Medications Ordered in UC Medications - No data to display  Initial Impression / Assessment and Plan / UC Course  I have reviewed the triage vital signs and the nursing notes.  Pertinent labs & imaging results that were available during my care of the patient were reviewed by me and considered in my medical decision making (see chart for details).     Callus to right foot.  Recommended using pads for comfort. Reassured patient that it is nothing concerning Recommended for continued or worsening problems he will need to see podiatry Final Clinical Impressions(s) / UC Diagnoses   Final diagnoses:  Metatarsalgia of right foot  Callus of foot     Discharge Instructions     I believe this problem will resolve on its own with time.  Nothing concerning today You can try using pads for the foot for comfort when you walk.  Follow up with podiatry as needed.     ED Prescriptions    None     Controlled Substance Prescriptions [place] Controlled Substance Registry consulted? Not Applicable   Orvan July, NP 05/30/19 1413

## 2019-05-30 NOTE — Discharge Instructions (Addendum)
I believe this problem will resolve on its own with time.  Nothing concerning today You can try using pads for the foot for comfort when you walk.  Follow up with podiatry as needed.

## 2019-05-30 NOTE — ED Triage Notes (Signed)
Pt here for right foot pain x 2 weeks; denies obvious injury

## 2019-06-20 DIAGNOSIS — I259 Chronic ischemic heart disease, unspecified: Secondary | ICD-10-CM | POA: Diagnosis not present

## 2019-06-20 DIAGNOSIS — R062 Wheezing: Secondary | ICD-10-CM | POA: Diagnosis not present

## 2019-06-20 DIAGNOSIS — I509 Heart failure, unspecified: Secondary | ICD-10-CM | POA: Diagnosis not present

## 2019-06-20 DIAGNOSIS — I2510 Atherosclerotic heart disease of native coronary artery without angina pectoris: Secondary | ICD-10-CM | POA: Diagnosis not present

## 2019-07-20 DIAGNOSIS — I2510 Atherosclerotic heart disease of native coronary artery without angina pectoris: Secondary | ICD-10-CM | POA: Diagnosis not present

## 2019-07-20 DIAGNOSIS — I259 Chronic ischemic heart disease, unspecified: Secondary | ICD-10-CM | POA: Diagnosis not present

## 2019-07-20 DIAGNOSIS — I509 Heart failure, unspecified: Secondary | ICD-10-CM | POA: Diagnosis not present

## 2019-07-20 DIAGNOSIS — R062 Wheezing: Secondary | ICD-10-CM | POA: Diagnosis not present

## 2019-08-20 DIAGNOSIS — I259 Chronic ischemic heart disease, unspecified: Secondary | ICD-10-CM | POA: Diagnosis not present

## 2019-08-20 DIAGNOSIS — I509 Heart failure, unspecified: Secondary | ICD-10-CM | POA: Diagnosis not present

## 2019-08-20 DIAGNOSIS — I2510 Atherosclerotic heart disease of native coronary artery without angina pectoris: Secondary | ICD-10-CM | POA: Diagnosis not present

## 2019-08-20 DIAGNOSIS — R062 Wheezing: Secondary | ICD-10-CM | POA: Diagnosis not present

## 2019-09-19 DIAGNOSIS — R062 Wheezing: Secondary | ICD-10-CM | POA: Diagnosis not present

## 2019-09-19 DIAGNOSIS — I2510 Atherosclerotic heart disease of native coronary artery without angina pectoris: Secondary | ICD-10-CM | POA: Diagnosis not present

## 2019-09-19 DIAGNOSIS — I509 Heart failure, unspecified: Secondary | ICD-10-CM | POA: Diagnosis not present

## 2019-09-19 DIAGNOSIS — I259 Chronic ischemic heart disease, unspecified: Secondary | ICD-10-CM | POA: Diagnosis not present

## 2019-10-19 DIAGNOSIS — Z20822 Contact with and (suspected) exposure to covid-19: Secondary | ICD-10-CM | POA: Diagnosis not present

## 2019-10-19 DIAGNOSIS — U071 COVID-19: Secondary | ICD-10-CM | POA: Diagnosis not present

## 2019-10-20 DIAGNOSIS — I509 Heart failure, unspecified: Secondary | ICD-10-CM | POA: Diagnosis not present

## 2019-10-20 DIAGNOSIS — R062 Wheezing: Secondary | ICD-10-CM | POA: Diagnosis not present

## 2019-10-20 DIAGNOSIS — I259 Chronic ischemic heart disease, unspecified: Secondary | ICD-10-CM | POA: Diagnosis not present

## 2019-10-20 DIAGNOSIS — I2510 Atherosclerotic heart disease of native coronary artery without angina pectoris: Secondary | ICD-10-CM | POA: Diagnosis not present

## 2019-10-22 DIAGNOSIS — Z20822 Contact with and (suspected) exposure to covid-19: Secondary | ICD-10-CM | POA: Diagnosis not present

## 2019-10-24 DIAGNOSIS — U071 COVID-19: Secondary | ICD-10-CM | POA: Diagnosis not present

## 2019-10-24 DIAGNOSIS — R197 Diarrhea, unspecified: Secondary | ICD-10-CM | POA: Diagnosis not present

## 2019-11-20 DIAGNOSIS — I2510 Atherosclerotic heart disease of native coronary artery without angina pectoris: Secondary | ICD-10-CM | POA: Diagnosis not present

## 2019-11-20 DIAGNOSIS — R062 Wheezing: Secondary | ICD-10-CM | POA: Diagnosis not present

## 2019-11-20 DIAGNOSIS — I509 Heart failure, unspecified: Secondary | ICD-10-CM | POA: Diagnosis not present

## 2019-11-20 DIAGNOSIS — I259 Chronic ischemic heart disease, unspecified: Secondary | ICD-10-CM | POA: Diagnosis not present

## 2019-12-18 DIAGNOSIS — R062 Wheezing: Secondary | ICD-10-CM | POA: Diagnosis not present

## 2019-12-18 DIAGNOSIS — I2510 Atherosclerotic heart disease of native coronary artery without angina pectoris: Secondary | ICD-10-CM | POA: Diagnosis not present

## 2019-12-18 DIAGNOSIS — I509 Heart failure, unspecified: Secondary | ICD-10-CM | POA: Diagnosis not present

## 2019-12-18 DIAGNOSIS — I259 Chronic ischemic heart disease, unspecified: Secondary | ICD-10-CM | POA: Diagnosis not present

## 2019-12-25 ENCOUNTER — Encounter: Payer: Self-pay | Admitting: Internal Medicine

## 2019-12-25 ENCOUNTER — Ambulatory Visit: Payer: Medicare HMO | Admitting: Internal Medicine

## 2019-12-25 ENCOUNTER — Other Ambulatory Visit: Payer: Self-pay

## 2019-12-25 VITALS — BP 158/79 | HR 70 | Temp 97.5°F | Ht 72.0 in | Wt 219.6 lb

## 2019-12-25 DIAGNOSIS — L84 Corns and callosities: Secondary | ICD-10-CM

## 2019-12-25 NOTE — Patient Instructions (Addendum)
Scott [name],  It was a pleasure meeting you today.  Please use your compression socks during the day.  At night remove them and put lotion on your feet to help soften the callus.    A podiatry referral has been made. Please follow-up in 1 month.

## 2019-12-25 NOTE — Progress Notes (Signed)
Subjective:     Patient ID: Scott [name], male    DOB: [birth year], 86 y.o.   MRN: [id number]  Chief Complaint  Patient presents with   Advice Only    for (R) foot wound    HPI: The patient is a 86 y.o. male with past medical history of insulin-dependent type II diabetes complicated by neuropathy, hypertension and hyperlipidemia here for plantar callus of right foot.  He states that he noticed the wound 6 months ago after a walk.  3 months ago he visited his PCP and was started on Santyl.  He reports using santyl daily for the past 3 months with improvement to the wound.  He states that originally the wound was open and draining however that has stopped.  Patient reports neuropathic pain with loss of sensation to his toes.  However, he does notice discomfort to the callus area when he walks.  He states he has had insoles created for his shoes due to his diabetes history.  He currently denies any acute pain or drainage from the wound.        Review of Systems  Skin: Negative for color change, pallor, rash and wound.     has a past medical history of Acute biliary pancreatitis (10/2017), Arthritis, CAD (coronary artery disease), Cancer ([place]) (2005), Cataract, Chronic combined systolic and diastolic CHF (congestive heart failure) ([place]) (08/2012), CKD (chronic kidney disease), stage III, Dyslipidemia, GERD (gastroesophageal reflux disease), Gout, History of home oxygen therapy, Hyperkalemia, Hypertension, Insulin Dependent Diabetes mellitus, Myocardial infarction ([place]) (1997), Sleep apnea, Thrombocytopenia ([place]) (2011), and UTI (lower urinary tract infection) (08/2012).  has a past surgical history that includes Cardiac catheterization (2005); Cardiac catheterization (2011); Appendectomy; Cholecystectomy; Partial colectomy; left and right heart catheterization with coronary/graft angiogram (09/04/2012); LEFT HEART CATH AND CORS/GRAFTS ANGIOGRAPHY (N/A, 06/17/2017); Coronary artery bypass graft (1997);  stent to heart (last done 5 to 6 yrs ago); Eye surgery; Back surgery; Esophagogastroduodenoscopy (egd) with propofol (N/A, 09/21/2017); Hernia repair; and ERCP (N/A, 10/17/2017).  reports that he quit smoking about 36 years ago. His smoking use included cigarettes. He has a 52.50 pack-year smoking history. He has never used smokeless tobacco. Objective:   Vital Signs BP (!) 158/79 (BP Location: Left Arm, Patient Position: Sitting, Cuff Size: Normal)    Pulse 70    Temp (!) 97.5 F (36.4 C) (Temporal)    Ht 6' (1.829 m)    Wt 219 lb 9.6 oz (99.6 kg)    SpO2 98%    BMI 29.78 kg/m  Vital Signs and Nursing Note Reviewed Physical Exam  Cardiovascular: Intact distal pulses.  Skin: Skin is warm and dry.  Callus to the plantar surface of the right foot Callus measures approximately 1.5 x 1.5 cm        Assessment/Plan:     ICD-10-CM   1. Plantar callus  L84    Assessment: Right plantar callus Patient has a 30-month history of a healing plantar wound.  Today there is no open wound and no signs of infection around the callus.  At this time I told the patient to stop using Santyl and to start using lotion on the bottom of his feet nightly to help soften the callus.   Patient has diabetic foot insoles that were created for him a while back.  The plantar surface of his foot has dropped and I am concerned he is going to have ongoing wound issues.  At this time I will refer to podiatry for further  assessment and proper footwear.  Compression socks were recommended and patient stated he would like to try them.  A pair was provided in the office.   Plan -Stop Santyl and use lotion at night on the bottom of his feet -Podiatry referral -Compression socks -Follow-up in 1 month   Boyd Kerbs, DO 12/25/2019, 2:03 PM

## 2020-01-01 ENCOUNTER — Other Ambulatory Visit: Payer: Self-pay

## 2020-01-01 ENCOUNTER — Institutional Professional Consult (permissible substitution): Payer: Medicare HMO | Admitting: Internal Medicine

## 2020-01-01 ENCOUNTER — Ambulatory Visit (HOSPITAL_COMMUNITY)
Admission: RE | Admit: 2020-01-01 | Discharge: 2020-01-01 | Disposition: A | Discharge status: Home / Self Care | Payer: Medicare HMO | Source: Ambulatory Visit | Attending: Internal Medicine | Admitting: Internal Medicine

## 2020-01-01 ENCOUNTER — Other Ambulatory Visit: Payer: Self-pay | Admitting: Internal Medicine

## 2020-01-01 ENCOUNTER — Encounter: Payer: Self-pay | Admitting: Internal Medicine

## 2020-01-01 ENCOUNTER — Ambulatory Visit (INDEPENDENT_AMBULATORY_CARE_PROVIDER_SITE_OTHER): Payer: Medicare HMO | Admitting: Internal Medicine

## 2020-01-01 VITALS — BP 139/76 | HR 76 | Temp 97.3°F | Ht 72.0 in | Wt 218.2 lb

## 2020-01-01 DIAGNOSIS — E11628 Type 2 diabetes mellitus with other skin complications: Secondary | ICD-10-CM

## 2020-01-01 DIAGNOSIS — L089 Local infection of the skin and subcutaneous tissue, unspecified: Secondary | ICD-10-CM

## 2020-01-01 LAB — SEDIMENTATION RATE: Sed Rate: 50 mm/hr — ABNORMAL HIGH (ref 0–16)

## 2020-01-01 LAB — C-REACTIVE PROTEIN: CRP: 10.5 mg/dL — ABNORMAL HIGH (ref <1.0)

## 2020-01-01 MED ORDER — CLINDAMYCIN HCL 300 MG PO CAPS: 300.0000 mg | ORAL_CAPSULE | Freq: Three times a day (TID) | ORAL | 0 refills | Status: AC

## 2020-01-01 NOTE — Patient Instructions (Addendum)
[name],  It was a pleasure seeing you today.  I'm concerned about an infection in your foot.  I have sent over an antibiotic to your pharmacy.  It is the same antibiotic you were taking earlier called clindamycin.  In the meantime please obtain x-rays of your right foot and blood work  Please follow-up in 1 week

## 2020-01-01 NOTE — Progress Notes (Addendum)
Subjective:     Patient ID: Scott [name], male    DOB: [birth year], 86 y.o.   MRN: [id number]  Chief Complaint  Patient presents with  . Follow-up    red and swollen plantar callus of right foot    HPI: The patient is a 86 y.o. male here for foot wound drainage  Patient was evaluated 1 week ago for a plantar callus.  He states that over the weekend he noticed drainage from the callus after he would take a shower.  He has not put anything on the wound.  He reports that 2 weeks ago he tripped and fell and scraped his leg.  He states he was evaluated by his physician at the [facility] and was started on clindamycin for redness on the top of his foot.  He stated that during his antibiotic course the redness and swelling had improved.  He states that over the weekend he stopped the antibiotic and that is when he noticed the drainage.  He denies pain or fever/chills.  He does have some discomfort to the foot and has noticed redness on the top.  Review of Systems  All other systems reviewed and are negative.    has a past medical history of Acute biliary pancreatitis (10/2017), Arthritis, CAD (coronary artery disease), Cancer ([place]) (2005), Cataract, Chronic combined systolic and diastolic CHF (congestive heart failure) ([place]) (08/2012), CKD (chronic kidney disease), stage III, Dyslipidemia, GERD (gastroesophageal reflux disease), Gout, History of home oxygen therapy, Hyperkalemia, Hypertension, Insulin Dependent Diabetes mellitus, Myocardial infarction ([place]) (1997), Sleep apnea, Thrombocytopenia ([place]) (2011), and UTI (lower urinary tract infection) (08/2012).  has a past surgical history that includes Cardiac catheterization (2005); Cardiac catheterization (2011); Appendectomy; Cholecystectomy; Partial colectomy; left and right heart catheterization with coronary/graft angiogram (09/04/2012); LEFT HEART CATH AND CORS/GRAFTS ANGIOGRAPHY (N/A, 06/17/2017); Coronary artery bypass graft (1997); stent to heart (last  done 5 to 6 yrs ago); Eye surgery; Back surgery; Esophagogastroduodenoscopy (egd) with propofol (N/A, 09/21/2017); Hernia repair; and ERCP (N/A, 10/17/2017).  reports that he quit smoking about 36 years ago. His smoking use included cigarettes. He has a 52.50 pack-year smoking history. He has never used smokeless tobacco. Objective:   Vital Signs BP 139/76 (BP Location: Right Arm, Patient Position: Sitting, Cuff Size: Large)   Pulse 76   Temp (!) 97.3 F (36.3 C) (Temporal)   Ht 6' (1.829 m)   Wt 218 lb 3.2 oz (99 kg)   SpO2 95%   BMI 29.59 kg/m  Vital Signs and Nursing Note Reviewed Physical Exam  Constitutional: He is well-developed, well-nourished, and in no distress. No distress.  Neurological: He is alert.  Skin: Skin is warm and dry.  Dorsal aspect of right foot is erythematous to the ankle Pustular drainage from plantar callus     Assessment/Plan:     ICD-10-CM   1. Diabetic foot infection ([place])  E11.628 DG Foot Complete Right   L08.9 C-reactive protein    Sedimentation Rate    Assessment: Diabetic foot infection Patient was evaluated last week for a plantar callus on his right foot and the wound was healed without any drainage or signs of infection at that time.  However, the wound today has a small opening that is draining pustular fluid.  The top of his foot is now erythematous tracking up to the ankle concerning for cellulitis.  With a history of insulin-dependent diabetes I am also concerned for osteomyelitis.  Currently his vitals are stable and he overall feels well.  There is no urgent need for IV antibiotics or inpatient referral.  I will obtain an x-ray of his right foot, ESR and CRP.  I will continue the clindamycin that he had finished as this appeared to have worked well for his previous infection.  He denied any side effects to the antibiotic. I will extend this for 10 more days.  If CRP and ESR are elevated he will need an MRI of his right foot.  Plan -Complete  right foot x-ray -CRP and ESR -Clindamycin for 10 more days -Follow-up in 1 week   Addendum: Patient had his right foot x-ray today with findings consistent with osteomyelitis involving the distal portion of the fifth metatarsal with overlying cellulitis.  CRP and ESR were elevated.    3/24 - Results were discussed with the patient over the phone.  He has been referred to vascular surgery for vascular studies and has an appointment on 3/25.  The patient is aware of this appointment.  He has also been referred to Radene Journey with [facility] for possible ray resection.   Overall patient states he feels well.  He re-started clindamycin yesterday.  He states the erythema on the top of his foot is stable along with the drainage.  I told him if the redness starts to spread or he feels unwell including fever/chills he would need to go to the ED.  Patient expressed understanding.    Boyd Kerbs, DO 01/01/2020, 1:22 PM

## 2020-01-03 ENCOUNTER — Other Ambulatory Visit: Payer: Self-pay | Admitting: Physician Assistant

## 2020-01-03 ENCOUNTER — Other Ambulatory Visit: Payer: Self-pay

## 2020-01-03 ENCOUNTER — Encounter: Payer: Self-pay | Admitting: Vascular Surgery

## 2020-01-03 ENCOUNTER — Ambulatory Visit (INDEPENDENT_AMBULATORY_CARE_PROVIDER_SITE_OTHER)
Admission: RE | Admit: 2020-01-03 | Discharge: 2020-01-03 | Disposition: A | Discharge status: Home / Self Care | Payer: Medicare HMO | Source: Ambulatory Visit | Attending: Vascular Surgery | Admitting: Vascular Surgery

## 2020-01-03 ENCOUNTER — Other Ambulatory Visit: Payer: Self-pay | Admitting: *Deleted

## 2020-01-03 ENCOUNTER — Inpatient Hospital Stay (HOSPITAL_COMMUNITY)
Admission: AD | Admit: 2020-01-03 | Discharge: 2020-01-09 | DRG: 299 | Disposition: A | Discharge status: Home Health | Payer: Medicare HMO | Source: Ambulatory Visit | Attending: Vascular Surgery | Admitting: Vascular Surgery

## 2020-01-03 ENCOUNTER — Ambulatory Visit: Payer: Medicare HMO | Admitting: Vascular Surgery

## 2020-01-03 VITALS — BP 148/75 | HR 68 | Temp 97.8°F | Resp 20 | Ht 72.0 in | Wt 219.0 lb

## 2020-01-03 DIAGNOSIS — E11628 Type 2 diabetes mellitus with other skin complications: Secondary | ICD-10-CM | POA: Insufficient documentation

## 2020-01-03 DIAGNOSIS — E1151 Type 2 diabetes mellitus with diabetic peripheral angiopathy without gangrene: Principal | ICD-10-CM | POA: Diagnosis present

## 2020-01-03 DIAGNOSIS — I70209 Unspecified atherosclerosis of native arteries of extremities, unspecified extremity: Secondary | ICD-10-CM | POA: Diagnosis not present

## 2020-01-03 DIAGNOSIS — E11621 Type 2 diabetes mellitus with foot ulcer: Secondary | ICD-10-CM | POA: Diagnosis present

## 2020-01-03 DIAGNOSIS — M86171 Other acute osteomyelitis, right ankle and foot: Secondary | ICD-10-CM

## 2020-01-03 DIAGNOSIS — L089 Local infection of the skin and subcutaneous tissue, unspecified: Secondary | ICD-10-CM

## 2020-01-03 DIAGNOSIS — Z951 Presence of aortocoronary bypass graft: Secondary | ICD-10-CM | POA: Diagnosis not present

## 2020-01-03 DIAGNOSIS — N179 Acute kidney failure, unspecified: Secondary | ICD-10-CM | POA: Diagnosis present

## 2020-01-03 DIAGNOSIS — R140 Abdominal distension (gaseous): Secondary | ICD-10-CM | POA: Diagnosis not present

## 2020-01-03 DIAGNOSIS — Z87891 Personal history of nicotine dependence: Secondary | ICD-10-CM | POA: Diagnosis not present

## 2020-01-03 DIAGNOSIS — L03115 Cellulitis of right lower limb: Secondary | ICD-10-CM | POA: Diagnosis not present

## 2020-01-03 DIAGNOSIS — G4730 Sleep apnea, unspecified: Secondary | ICD-10-CM | POA: Diagnosis present

## 2020-01-03 DIAGNOSIS — I739 Peripheral vascular disease, unspecified: Secondary | ICD-10-CM | POA: Diagnosis present

## 2020-01-03 DIAGNOSIS — Z825 Family history of asthma and other chronic lower respiratory diseases: Secondary | ICD-10-CM | POA: Diagnosis not present

## 2020-01-03 DIAGNOSIS — M86071 Acute hematogenous osteomyelitis, right ankle and foot: Secondary | ICD-10-CM | POA: Diagnosis not present

## 2020-01-03 DIAGNOSIS — Z8249 Family history of ischemic heart disease and other diseases of the circulatory system: Secondary | ICD-10-CM | POA: Diagnosis not present

## 2020-01-03 DIAGNOSIS — K219 Gastro-esophageal reflux disease without esophagitis: Secondary | ICD-10-CM | POA: Diagnosis present

## 2020-01-03 DIAGNOSIS — I2510 Atherosclerotic heart disease of native coronary artery without angina pectoris: Secondary | ICD-10-CM | POA: Diagnosis present

## 2020-01-03 DIAGNOSIS — M868X7 Other osteomyelitis, ankle and foot: Secondary | ICD-10-CM | POA: Diagnosis not present

## 2020-01-03 DIAGNOSIS — I5042 Chronic combined systolic (congestive) and diastolic (congestive) heart failure: Secondary | ICD-10-CM | POA: Diagnosis not present

## 2020-01-03 DIAGNOSIS — Z841 Family history of disorders of kidney and ureter: Secondary | ICD-10-CM | POA: Diagnosis not present

## 2020-01-03 DIAGNOSIS — E785 Hyperlipidemia, unspecified: Secondary | ICD-10-CM | POA: Diagnosis present

## 2020-01-03 DIAGNOSIS — M1990 Unspecified osteoarthritis, unspecified site: Secondary | ICD-10-CM | POA: Diagnosis present

## 2020-01-03 DIAGNOSIS — K5900 Constipation, unspecified: Secondary | ICD-10-CM | POA: Diagnosis not present

## 2020-01-03 DIAGNOSIS — I70235 Atherosclerosis of native arteries of right leg with ulceration of other part of foot: Secondary | ICD-10-CM

## 2020-01-03 DIAGNOSIS — E1122 Type 2 diabetes mellitus with diabetic chronic kidney disease: Secondary | ICD-10-CM | POA: Diagnosis not present

## 2020-01-03 DIAGNOSIS — Z7982 Long term (current) use of aspirin: Secondary | ICD-10-CM

## 2020-01-03 DIAGNOSIS — I130 Hypertensive heart and chronic kidney disease with heart failure and stage 1 through stage 4 chronic kidney disease, or unspecified chronic kidney disease: Secondary | ICD-10-CM | POA: Diagnosis not present

## 2020-01-03 DIAGNOSIS — N183 Chronic kidney disease, stage 3 unspecified: Secondary | ICD-10-CM | POA: Diagnosis present

## 2020-01-03 DIAGNOSIS — N170 Acute kidney failure with tubular necrosis: Secondary | ICD-10-CM | POA: Diagnosis not present

## 2020-01-03 DIAGNOSIS — M109 Gout, unspecified: Secondary | ICD-10-CM | POA: Diagnosis present

## 2020-01-03 DIAGNOSIS — I252 Old myocardial infarction: Secondary | ICD-10-CM

## 2020-01-03 DIAGNOSIS — Z9049 Acquired absence of other specified parts of digestive tract: Secondary | ICD-10-CM | POA: Diagnosis not present

## 2020-01-03 DIAGNOSIS — Z01810 Encounter for preprocedural cardiovascular examination: Secondary | ICD-10-CM

## 2020-01-03 DIAGNOSIS — E1169 Type 2 diabetes mellitus with other specified complication: Secondary | ICD-10-CM | POA: Diagnosis present

## 2020-01-03 DIAGNOSIS — Z794 Long term (current) use of insulin: Secondary | ICD-10-CM

## 2020-01-03 DIAGNOSIS — E1142 Type 2 diabetes mellitus with diabetic polyneuropathy: Secondary | ICD-10-CM | POA: Diagnosis present

## 2020-01-03 DIAGNOSIS — L97519 Non-pressure chronic ulcer of other part of right foot with unspecified severity: Secondary | ICD-10-CM | POA: Diagnosis not present

## 2020-01-03 DIAGNOSIS — T508X5A Adverse effect of diagnostic agents, initial encounter: Secondary | ICD-10-CM | POA: Diagnosis not present

## 2020-01-03 DIAGNOSIS — I724 Aneurysm of artery of lower extremity: Secondary | ICD-10-CM | POA: Diagnosis present

## 2020-01-03 DIAGNOSIS — E1136 Type 2 diabetes mellitus with diabetic cataract: Secondary | ICD-10-CM | POA: Diagnosis present

## 2020-01-03 DIAGNOSIS — Z79899 Other long term (current) drug therapy: Secondary | ICD-10-CM

## 2020-01-03 LAB — COMPREHENSIVE METABOLIC PANEL
ALT: 19 U/L (ref 0–44)
AST: 38 U/L (ref 15–41)
Albumin: 3.1 g/dL — ABNORMAL LOW (ref 3.5–5.0)
Alkaline Phosphatase: 69 U/L (ref 38–126)
Anion gap: 9 (ref 5–15)
BUN: 33 mg/dL — ABNORMAL HIGH (ref 8–23)
CO2: 24 mmol/L (ref 22–32)
Calcium: 8.6 mg/dL — ABNORMAL LOW (ref 8.9–10.3)
Chloride: 99 mmol/L (ref 98–111)
Creatinine, Ser: 1.73 mg/dL — ABNORMAL HIGH (ref 0.61–1.24)
GFR calc Af Amer: 41 mL/min — ABNORMAL LOW (ref >60)
GFR calc non Af Amer: 35 mL/min — ABNORMAL LOW (ref >60)
Glucose, Bld: 228 mg/dL — ABNORMAL HIGH (ref 70–99)
Potassium: 5.5 mmol/L — ABNORMAL HIGH (ref 3.5–5.1)
Sodium: 132 mmol/L — ABNORMAL LOW (ref 135–145)
Total Bilirubin: 1.3 mg/dL — ABNORMAL HIGH (ref 0.3–1.2)
Total Protein: 7 g/dL (ref 6.5–8.1)

## 2020-01-03 LAB — GLUCOSE, CAPILLARY
Glucose-Capillary: 173 mg/dL — ABNORMAL HIGH (ref 70–99)
Glucose-Capillary: 220 mg/dL — ABNORMAL HIGH (ref 70–99)

## 2020-01-03 LAB — CBC
HCT: 37.6 % — ABNORMAL LOW (ref 39.0–52.0)
Hemoglobin: 11.7 g/dL — ABNORMAL LOW (ref 13.0–17.0)
MCH: 28.6 pg (ref 26.0–34.0)
MCHC: 31.1 g/dL (ref 30.0–36.0)
MCV: 91.9 fL (ref 80.0–100.0)
Platelets: 150 10*3/uL (ref 150–400)
RBC: 4.09 MIL/uL — ABNORMAL LOW (ref 4.22–5.81)
RDW: 13.6 % (ref 11.5–15.5)
WBC: 8.4 10*3/uL (ref 4.0–10.5)
nRBC: 0 % (ref 0.0–0.2)

## 2020-01-03 LAB — HEMOGLOBIN A1C
Hgb A1c MFr Bld: 7.6 % — ABNORMAL HIGH (ref 4.8–5.6)
Mean Plasma Glucose: 171.42 mg/dL

## 2020-01-03 LAB — PROTIME-INR
INR: 1.1 (ref 0.8–1.2)
Prothrombin Time: 14.5 seconds (ref 11.4–15.2)

## 2020-01-03 MED ORDER — BISACODYL 5 MG PO TBEC
5.0000 mg | DELAYED_RELEASE_TABLET | Freq: Every day | ORAL | Status: DC | PRN
  Administered 2020-01-08: 18:00:00 5 mg via ORAL
  Filled 2020-01-03 (×2): qty 1

## 2020-01-03 MED ORDER — ONDANSETRON HCL 4 MG/2ML IJ SOLN: 4.0000 mg | Freq: Four times a day (QID) | INTRAMUSCULAR | Status: DC | PRN

## 2020-01-03 MED ORDER — SENNOSIDES-DOCUSATE SODIUM 8.6-50 MG PO TABS
1.0000 | ORAL_TABLET | Freq: Every evening | ORAL | Status: DC | PRN
  Administered 2020-01-08: 1 via ORAL
  Filled 2020-01-03: qty 1

## 2020-01-03 MED ORDER — HYDROMORPHONE HCL 1 MG/ML IJ SOLN: 0.5000 mg | INTRAMUSCULAR | Status: DC | PRN

## 2020-01-03 MED ORDER — HYDRALAZINE HCL 20 MG/ML IJ SOLN: 5.0000 mg | INTRAMUSCULAR | Status: DC | PRN

## 2020-01-03 MED ORDER — SODIUM CHLORIDE 0.9 % IV SOLN: INTRAVENOUS | Status: DC

## 2020-01-03 MED ORDER — POTASSIUM CHLORIDE CRYS ER 20 MEQ PO TBCR
20.0000 meq | EXTENDED_RELEASE_TABLET | Freq: Once | ORAL | Status: DC
  Filled 2020-01-03: qty 2

## 2020-01-03 MED ORDER — LABETALOL HCL 5 MG/ML IV SOLN: 10.0000 mg | INTRAVENOUS | Status: DC | PRN

## 2020-01-03 MED ORDER — ACETAMINOPHEN 325 MG RE SUPP: 325.0000 mg | RECTAL | Status: DC | PRN

## 2020-01-03 MED ORDER — PANTOPRAZOLE SODIUM 40 MG PO TBEC
40.0000 mg | DELAYED_RELEASE_TABLET | Freq: Every day | ORAL | Status: DC
  Administered 2020-01-04 – 2020-01-09 (×6): 40 mg via ORAL
  Filled 2020-01-03 (×6): qty 1

## 2020-01-03 MED ORDER — OXYCODONE HCL 5 MG PO TABS
5.0000 mg | ORAL_TABLET | ORAL | Status: DC | PRN
  Administered 2020-01-03: 10 mg via ORAL
  Filled 2020-01-03: qty 2

## 2020-01-03 MED ORDER — ACETAMINOPHEN 325 MG PO TABS: 325.0000 mg | ORAL_TABLET | ORAL | Status: DC | PRN

## 2020-01-03 MED ORDER — VANCOMYCIN HCL 1500 MG/300ML IV SOLN
1500.0000 mg | Freq: Once | INTRAVENOUS | Status: AC
  Administered 2020-01-03: 1500 mg via INTRAVENOUS
  Filled 2020-01-03: qty 300

## 2020-01-03 MED ORDER — METOPROLOL TARTRATE 5 MG/5ML IV SOLN: 2.0000 mg | INTRAVENOUS | Status: DC | PRN

## 2020-01-03 MED ORDER — DOCUSATE SODIUM 100 MG PO CAPS
100.0000 mg | ORAL_CAPSULE | Freq: Two times a day (BID) | ORAL | Status: DC
  Administered 2020-01-03 – 2020-01-09 (×6): 100 mg via ORAL
  Filled 2020-01-03 (×8): qty 1

## 2020-01-03 MED ORDER — ALUM & MAG HYDROXIDE-SIMETH 200-200-20 MG/5ML PO SUSP: 15.0000 mL | ORAL | Status: DC | PRN

## 2020-01-03 MED ORDER — VANCOMYCIN HCL 750 MG/150ML IV SOLN
750.0000 mg | INTRAVENOUS | Status: DC
  Administered 2020-01-04 – 2020-01-06 (×3): 750 mg via INTRAVENOUS
  Filled 2020-01-03 (×4): qty 150

## 2020-01-03 MED ORDER — INSULIN ASPART 100 UNIT/ML ~~LOC~~ SOLN
0.0000 [IU] | Freq: Three times a day (TID) | SUBCUTANEOUS | Status: DC
  Administered 2020-01-03 – 2020-01-06 (×5): 2 [IU] via SUBCUTANEOUS
  Administered 2020-01-06: 3 [IU] via SUBCUTANEOUS
  Administered 2020-01-07: 1 [IU] via SUBCUTANEOUS
  Administered 2020-01-08: 2 [IU] via SUBCUTANEOUS
  Administered 2020-01-08: 1 [IU] via SUBCUTANEOUS
  Administered 2020-01-08 – 2020-01-09 (×2): 2 [IU] via SUBCUTANEOUS
  Administered 2020-01-09: 3 [IU] via SUBCUTANEOUS

## 2020-01-03 MED ORDER — HEPARIN SODIUM (PORCINE) 5000 UNIT/ML IJ SOLN
5000.0000 [IU] | Freq: Three times a day (TID) | INTRAMUSCULAR | Status: DC
  Administered 2020-01-03 – 2020-01-09 (×14): 5000 [IU] via SUBCUTANEOUS
  Filled 2020-01-03 (×17): qty 1

## 2020-01-03 NOTE — Progress Notes (Signed)
Referring Physician: Dr Migdalia Dk  Patient name: Scott [name] MRN: [id number] DOB: 09-07-1933 Sex: male  REASON FOR CONSULT: Right foot wound  HPI: Scott [name] is a 86 y.o. male, with about a 14-month history of a wound in the right foot.  He has been on antibiotics for about 5 days due to increasing drainage and redness.  He is currently on clindamycin.  He feels this is getting worse.  He does have neuropathy in both feet.  He has had at least a portion of the right greater saphenous vein harvested for coronary bypass in the past.  He does not really describe claudication symptoms.  He is on aspirin and Lipitor.  Serum creatinine tends to range from 1.8-2 Other medical problems include artery disease, CKD 3, lipidemia, nighttime CPAP for sleep apnea, hypertension, diabetes all of which have been stable.  Past Medical History:  Diagnosis Date  . Acute biliary pancreatitis 10/2017   with choledocholithiasis.  ERCP, stone extraction performed  . Arthritis   . CAD (coronary artery disease)    a. 1997 CABGx3 (VG->RPDA, VG->PLV, LIMA->LAD);  b. 07/2004 PCI VG->PLV (3.5x16 Taxus DES, 3.5x12 Taxus DES); c. 11/2009 PCI: VG->PLV 3.5x12 Promus DES), VG->RPDA (3.5x15 Promus DES); d. 08/2012 Cath: patent grafts; e. 05/2013 Lexiscan MV: no ischemia/infarction, EF 44%.  . Cancer [facility]) 2005   colon surgery done  . Cataract   . Chronic combined systolic and diastolic CHF (congestive heart failure) ([place]) 08/2012   a. EF 50-55% 11/2011; b. 08/2012 - EF 25-30%,  with mild RM, mod TR, mod RAE & LAE, moderate reduced RV systolic function and PASP of 71mmHg. - > cath showed patent grafts & Mod Puml HTN wth elevated PCWP;  c. 01/2014 Echo: EF 35-40%, mod LVH, mod HK, mildly dil LA, mod dil RA, PASP 35mmHg.  . CKD (chronic kidney disease), stage III   . Dyslipidemia   . GERD (gastroesophageal reflux disease)   . Gout   . History of home oxygen therapy    uses oxygen with cpap low level not sure how many liters  .  Hyperkalemia   . Hypertension   . Insulin Dependent Diabetes mellitus    type 2  . Myocardial infarction ([place]) 1997  . Sleep apnea   . Thrombocytopenia ([place]) 2011   dates back to 2011  . UTI (lower urinary tract infection) 08/2012   E.coli   Past Surgical History:  Procedure Laterality Date  . APPENDECTOMY    . BACK SURGERY     15 years ago lower  . CARDIAC CATHETERIZATION  2005   stenting of the vein graft to the posterior lateral per -- Dr. Martinique      . CARDIAC CATHETERIZATION  2011   showed right coronary, totally occluded LAD and severe stenosis in both saphenous vein graft to the posterior lateral and posterior descending  -- stenting of the proximal portion of the SVG to the posterior lateral branch in 2/11  . CHOLECYSTECTOMY    . CORONARY ARTERY BYPASS GRAFT  1997    (LIMA to LAD, SVG to PDA, SVG to PL)  . ERCP N/A 10/17/2017    Irene Shipper, MD;  [facility] ENDOSCOPY; choledocholithiasis, biliary pancreatitis.  s/p sphincterotomy and stone extraction.    . ESOPHAGOGASTRODUODENOSCOPY (EGD) WITH PROPOFOL N/A 09/21/2017   Procedure: ESOPHAGOGASTRODUODENOSCOPY (EGD) WITH PROPOFOL;  Surgeon: Doran Stabler, MD;  Location: [facility] ENDOSCOPY;  Service: Gastroenterology;  Laterality: N/A;  . EYE SURGERY  ioc for cataract  . HERNIA REPAIR    . LEFT AND RIGHT HEART CATHETERIZATION WITH CORONARY/GRAFT ANGIOGRAM  09/04/2012   Procedure: LEFT AND RIGHT HEART CATHETERIZATION WITH Beatrix Fetters;  Surgeon: Sherren Mocha, MD;  Location: [facility] CATH LAB;  patent grafts, mod 2ndary pulmonary HTN, elevated LV EDP & PCWP  . LEFT HEART CATH AND CORS/GRAFTS ANGIOGRAPHY N/A 06/17/2017   Procedure: LEFT HEART CATH AND CORS/GRAFTS ANGIOGRAPHY;  Surgeon: Martinique, Peter M, MD;  Location: [place] CV LAB;  Service: Cardiovascular;  Laterality: N/A;  . PARTIAL COLECTOMY     cancerous polyps  . stent to heart  last done 5 to 6 yrs ago   x 4    Family History  Problem Relation Age of Onset  .  Coronary artery disease Father   . Heart attack Father   . Coronary artery disease Brother        1/2 brother   . Kidney disease Sister   . Cardiomyopathy Mother   . Asthma Brother     SOCIAL HISTORY: Social History   Socioeconomic History  . Marital status: Single    Spouse name: Not on file  . Number of children: 0  . Years of education: Not on file  . Highest education level: Not on file  Occupational History  . Occupation: retired   ran a Office manager course  Tobacco Use  . Smoking status: Former Smoker    Packs/day: 1.50    Years: 35.00    Pack years: 52.50    Types: Cigarettes    Quit date: 10/12/1983    Years since quitting: 36.2  . Smokeless tobacco: Never Used  Substance and Sexual Activity  . Alcohol use: Yes    Alcohol/week: 0.0 standard drinks    Comment: 2-3 drinks 3-4x/wk.  . Drug use: No  . Sexual activity: Not Currently    Birth control/protection: None  Other Topics Concern  . Not on file  Social History Narrative   Lives in [place] by himself.  Active but does not routinely exercise.   Social Determinants of Health   Financial Resource Strain:   . Difficulty of Paying Living Expenses:   Food Insecurity:   . Worried About Charity fundraiser in the Last Year:   . Arboriculturist in the Last Year:   Transportation Needs:   . Film/video editor (Medical):   Marland Kitchen Lack of Transportation (Non-Medical):   Physical Activity:   . Days of Exercise per Week:   . Minutes of Exercise per Session:   Stress:   . Feeling of Stress :   Social Connections:   . Frequency of Communication with Friends and Family:   . Frequency of Social Gatherings with Friends and Family:   . Attends Religious Services:   . Active Member of Clubs or Organizations:   . Attends Archivist Meetings:   Marland Kitchen Marital Status:   Intimate Partner Violence:   . Fear of Current or Ex-Partner:   . Emotionally Abused:   Marland Kitchen Physically Abused:   . Sexually Abused:     No Known  Allergies  Current Outpatient Medications  Medication Sig Dispense Refill  . allopurinol (ZYLOPRIM) 100 MG tablet Take 2 tablets (200 mg total) by mouth daily. (Patient taking differently: Take 100 mg by mouth 2 (two) times daily. ) 60 tablet 1  . aspirin 81 MG EC tablet Take 81 mg by mouth at bedtime.     Marland Kitchen atorvastatin (LIPITOR) 80 MG tablet  Take 80 mg by mouth daily.     Marland Kitchen bismuth subsalicylate (PEPTO BISMOL) 262 MG/15ML suspension Take 30 mLs by mouth every 6 (six) hours as needed for indigestion.    . carvedilol (COREG) 12.5 MG tablet Take 1 tablet (12.5 mg total) by mouth 2 (two) times daily with a meal. 60 tablet 6  . Cholecalciferol (VITAMIN D-3) 25 MCG (1000 UT) CAPS Take 1,000 Units by mouth every other day.    . clindamycin (CLEOCIN) 300 MG capsule Take 1 capsule (300 mg total) by mouth 3 (three) times daily for 10 days. 30 capsule 0  . colchicine 0.6 MG tablet TAKE 1 TABLET(0.6 MG) BY MOUTH THREE TIMES DAILY AS NEEDED FOR GOUT PAIN (Patient taking differently: Take 0.6 mg by mouth 3 (three) times daily as needed (for flares). ) 60 tablet 0  . fluticasone (FLONASE) 50 MCG/ACT nasal spray Place 2 sprays into both nostrils daily as needed for allergies or rhinitis.    . furosemide (LASIX) 40 MG tablet TAKE 1/2 TABLETS BY MOUTH 2 TIMES DAILY (Patient taking differently: Take 40 mg by mouth 2 (two) times daily. ) 90 tablet 0  . gabapentin (NEURONTIN) 300 MG capsule Take 300 mg by mouth at bedtime.     . insulin aspart protamine- aspart (NOVOLOG MIX 70/30) (70-30) 100 UNIT/ML injection Inject 40 Units into the skin 2 (two) times daily before a meal.     . latanoprost (XALATAN) 0.005 % ophthalmic solution Place 1 drop into both eyes at bedtime.    Marland Kitchen loratadine (CLARITIN) 10 MG tablet Take 10 mg by mouth daily as needed (for seasonal allergies).     . losartan (COZAAR) 100 MG tablet Take 100 mg by mouth daily.    . Multiple Vitamin (MULTIVITAMIN) tablet Take 1 tablet by mouth daily.      .  nitroGLYCERIN (NITROSTAT) 0.4 MG SL tablet Place 1 tablet (0.4 mg total) under the tongue every 5 (five) minutes as needed for chest pain (up to 3 doses). 25 tablet 3  . pantoprazole (PROTONIX) 40 MG tablet Take 1 tablet (40 mg total) by mouth 2 (two) times daily. (Patient taking differently: Take 40 mg by mouth daily. ) 180 tablet 3  . PRESCRIPTION MEDICATION CPAP: At bedtime    . tamsulosin (FLOMAX) 0.4 MG CAPS capsule Take 0.4 mg by mouth every evening.     . Zinc 50 MG TABS     . isosorbide mononitrate (IMDUR) 30 MG 24 hr tablet Take 1 tablet (30 mg total) by mouth daily. (Patient not taking: Reported on 12/30/2018) 30 tablet 6   No current facility-administered medications for this visit.    ROS:   General:  No weight loss, Fever, chills  HEENT: No recent headaches, no nasal bleeding, no visual changes, no sore throat  Neurologic: No dizziness, blackouts, seizures. No recent symptoms of stroke or mini- stroke. No recent episodes of slurred speech, or temporary blindness.  Cardiac: No recent episodes of chest pain/pressure, no shortness of breath at rest.  + shortness of breath with exertion.  Denies history of atrial fibrillation or irregular heartbeat  Vascular: No history of rest pain in feet.  No history of claudication.  + history of non-healing ulcer, No history of DVT   Pulmonary: No home oxygen, no productive cough, no hemoptysis,  No asthma or wheezing  Musculoskeletal:  [ ]  Arthritis, [ ]  Low back pain,  [ ]  Joint pain  Hematologic:No history of hypercoagulable state.  No history of easy bleeding.  No history of anemia  Gastrointestinal: No hematochezia or melena,  No gastroesophageal reflux, no trouble swallowing  Urinary: [X]  chronic Kidney disease, [ ]  on HD - [ ]  MWF or [ ]  TTHS, [ ]  Burning with urination, [ ]  Frequent urination, [ ]  Difficulty urinating;   Skin: No rashes  Psychological: No history of anxiety,  No history of depression   Physical  Examination  Vitals:   01/03/20 1116  BP: (!) 148/75  Pulse: 68  Resp: 20  Temp: 97.8 F (36.6 C)  SpO2: 98%  Weight: 219 lb (99.3 kg)  Height: 6' (1.829 m)    Body mass index is 29.7 kg/m.  General:  Alert and oriented, no acute distress HEENT: Normal Neck: No JVD Cardiac: Regular Rate and Rhythm Abdomen: Soft, non-tender, non-distended, no mass, large ventral hernia with well-healed lower midline scar Skin: No rash, erythema extending from the fifth metatarsal onto the dorsum and lateral aspect of the right foot.  Some mild erythema in the pretibial area right leg. Extremity Pulses:  2+ radial, brachial, 1+ femoral at the very upper portion of the inguinal ligament, absent popliteal dorsalis pedis, posterior tibial pulses bilaterally Musculoskeletal: No deformity trace right ankle and foot edema  Neurologic: Upper and lower extremity motor 5/5 and symmetric  DATA:  Patient had bilateral ABIs performed today which were 0.55 on the right 1.08 on the left  ASSESSMENT: Cellulitis most likely osteomyelitis of the right fifth metatarsal head with decreased ABI in the right leg   PLAN: Patient will be admitted today for IV antibiotics.  We will start hydrating him today with the plan to do an arteriogram tomorrow if his creatinine is reasonable.  He will need I&D of his right foot eventually and we will get an MRI to determine extent of infection in that.  Most likely he is going to need resection of the right fifth metatarsal head at a minimum.  Risk benefits possible complications of procedure details of arteriogram were discussed with the patient today as well as the possibility that he is a limb threatening situation.  He understands and wishes to proceed.   [name], MD [facility] Office: [phone] Pager: [phone]

## 2020-01-03 NOTE — H&P (View-Only) (Signed)
Referring Physician: Dr Migdalia Dk  Patient name: Scott [name] MRN: [id number] DOB: [birth year] Sex: male  REASON FOR CONSULT: Right foot wound  HPI: ALPHONZO DEVERA is a 86 y.o. male, with about a 29-month history of a wound in the right foot.  He has been on antibiotics for about 5 days due to increasing drainage and redness.  He is currently on clindamycin.  He feels this is getting worse.  He does have neuropathy in both feet.  He has had at least a portion of the right greater saphenous vein harvested for coronary bypass in the past.  He does not really describe claudication symptoms.  He is on aspirin and Lipitor.  Serum creatinine tends to range from 1.8-2 Other medical problems include artery disease, CKD 3, lipidemia, nighttime CPAP for sleep apnea, hypertension, diabetes all of which have been stable.  Past Medical History:  Diagnosis Date  . Acute biliary pancreatitis 10/2017   with choledocholithiasis.  ERCP, stone extraction performed  . Arthritis   . CAD (coronary artery disease)    a. 1997 CABGx3 (VG->RPDA, VG->PLV, LIMA->LAD);  b. 07/2004 PCI VG->PLV (3.5x16 Taxus DES, 3.5x12 Taxus DES); c. 11/2009 PCI: VG->PLV 3.5x12 Promus DES), VG->RPDA (3.5x15 Promus DES); d. 08/2012 Cath: patent grafts; e. 05/2013 Lexiscan MV: no ischemia/infarction, EF 44%.  . Cancer [facility]) 2005   colon surgery done  . Cataract   . Chronic combined systolic and diastolic CHF (congestive heart failure) ([place]) 08/2012   a. EF 50-55% 11/2011; b. 08/2012 - EF 25-30%,  with mild RM, mod TR, mod RAE & LAE, moderate reduced RV systolic function and PASP of 48mmHg. - > cath showed patent grafts & Mod Puml HTN wth elevated PCWP;  c. 01/2014 Echo: EF 35-40%, mod LVH, mod HK, mildly dil LA, mod dil RA, PASP 18mmHg.  . CKD (chronic kidney disease), stage III   . Dyslipidemia   . GERD (gastroesophageal reflux disease)   . Gout   . History of home oxygen therapy    uses oxygen with cpap low level not sure how many liters  .  Hyperkalemia   . Hypertension   . Insulin Dependent Diabetes mellitus    type 2  . Myocardial infarction ([place]) 1997  . Sleep apnea   . Thrombocytopenia ([place]) 2011   dates back to 2011  . UTI (lower urinary tract infection) 08/2012   E.coli   Past Surgical History:  Procedure Laterality Date  . APPENDECTOMY    . BACK SURGERY     15 years ago lower  . CARDIAC CATHETERIZATION  2005   stenting of the vein graft to the posterior lateral per -- Dr. Martinique      . CARDIAC CATHETERIZATION  2011   showed right coronary, totally occluded LAD and severe stenosis in both saphenous vein graft to the posterior lateral and posterior descending  -- stenting of the proximal portion of the SVG to the posterior lateral branch in 2/11  . CHOLECYSTECTOMY    . CORONARY ARTERY BYPASS GRAFT  1997    (LIMA to LAD, SVG to PDA, SVG to PL)  . ERCP N/A 10/17/2017    Irene Shipper, MD;  [facility] ENDOSCOPY; choledocholithiasis, biliary pancreatitis.  s/p sphincterotomy and stone extraction.    . ESOPHAGOGASTRODUODENOSCOPY (EGD) WITH PROPOFOL N/A 09/21/2017   Procedure: ESOPHAGOGASTRODUODENOSCOPY (EGD) WITH PROPOFOL;  Surgeon: Doran Stabler, MD;  Location: [facility] ENDOSCOPY;  Service: Gastroenterology;  Laterality: N/A;  . EYE SURGERY  ioc for cataract  . HERNIA REPAIR    . LEFT AND RIGHT HEART CATHETERIZATION WITH CORONARY/GRAFT ANGIOGRAM  09/04/2012   Procedure: LEFT AND RIGHT HEART CATHETERIZATION WITH Beatrix Fetters;  Surgeon: Sherren Mocha, MD;  Location: [facility] CATH LAB;  patent grafts, mod 2ndary pulmonary HTN, elevated LV EDP & PCWP  . LEFT HEART CATH AND CORS/GRAFTS ANGIOGRAPHY N/A 06/17/2017   Procedure: LEFT HEART CATH AND CORS/GRAFTS ANGIOGRAPHY;  Surgeon: Martinique, Peter M, MD;  Location: [place] CV LAB;  Service: Cardiovascular;  Laterality: N/A;  . PARTIAL COLECTOMY     cancerous polyps  . stent to heart  last done 5 to 6 yrs ago   x 4    Family History  Problem Relation Age of Onset  .  Coronary artery disease Father   . Heart attack Father   . Coronary artery disease Brother        1/2 brother   . Kidney disease Sister   . Cardiomyopathy Mother   . Asthma Brother     SOCIAL HISTORY: Social History   Socioeconomic History  . Marital status: Single    Spouse name: Not on file  . Number of children: 0  . Years of education: Not on file  . Highest education level: Not on file  Occupational History  . Occupation: retired   ran a Office manager course  Tobacco Use  . Smoking status: Former Smoker    Packs/day: 1.50    Years: 35.00    Pack years: 52.50    Types: Cigarettes    Quit date: 10/12/1983    Years since quitting: 36.2  . Smokeless tobacco: Never Used  Substance and Sexual Activity  . Alcohol use: Yes    Alcohol/week: 0.0 standard drinks    Comment: 2-3 drinks 3-4x/wk.  . Drug use: No  . Sexual activity: Not Currently    Birth control/protection: None  Other Topics Concern  . Not on file  Social History Narrative   Lives in [place] by himself.  Active but does not routinely exercise.   Social Determinants of Health   Financial Resource Strain:   . Difficulty of Paying Living Expenses:   Food Insecurity:   . Worried About Charity fundraiser in the Last Year:   . Arboriculturist in the Last Year:   Transportation Needs:   . Film/video editor (Medical):   Marland Kitchen Lack of Transportation (Non-Medical):   Physical Activity:   . Days of Exercise per Week:   . Minutes of Exercise per Session:   Stress:   . Feeling of Stress :   Social Connections:   . Frequency of Communication with Friends and Family:   . Frequency of Social Gatherings with Friends and Family:   . Attends Religious Services:   . Active Member of Clubs or Organizations:   . Attends Archivist Meetings:   Marland Kitchen Marital Status:   Intimate Partner Violence:   . Fear of Current or Ex-Partner:   . Emotionally Abused:   Marland Kitchen Physically Abused:   . Sexually Abused:     No Known  Allergies  Current Outpatient Medications  Medication Sig Dispense Refill  . allopurinol (ZYLOPRIM) 100 MG tablet Take 2 tablets (200 mg total) by mouth daily. (Patient taking differently: Take 100 mg by mouth 2 (two) times daily. ) 60 tablet 1  . aspirin 81 MG EC tablet Take 81 mg by mouth at bedtime.     Marland Kitchen atorvastatin (LIPITOR) 80 MG tablet  Take 80 mg by mouth daily.     Marland Kitchen bismuth subsalicylate (PEPTO BISMOL) 262 MG/15ML suspension Take 30 mLs by mouth every 6 (six) hours as needed for indigestion.    . carvedilol (COREG) 12.5 MG tablet Take 1 tablet (12.5 mg total) by mouth 2 (two) times daily with a meal. 60 tablet 6  . Cholecalciferol (VITAMIN D-3) 25 MCG (1000 UT) CAPS Take 1,000 Units by mouth every other day.    . clindamycin (CLEOCIN) 300 MG capsule Take 1 capsule (300 mg total) by mouth 3 (three) times daily for 10 days. 30 capsule 0  . colchicine 0.6 MG tablet TAKE 1 TABLET(0.6 MG) BY MOUTH THREE TIMES DAILY AS NEEDED FOR GOUT PAIN (Patient taking differently: Take 0.6 mg by mouth 3 (three) times daily as needed (for flares). ) 60 tablet 0  . fluticasone (FLONASE) 50 MCG/ACT nasal spray Place 2 sprays into both nostrils daily as needed for allergies or rhinitis.    . furosemide (LASIX) 40 MG tablet TAKE 1/2 TABLETS BY MOUTH 2 TIMES DAILY (Patient taking differently: Take 40 mg by mouth 2 (two) times daily. ) 90 tablet 0  . gabapentin (NEURONTIN) 300 MG capsule Take 300 mg by mouth at bedtime.     . insulin aspart protamine- aspart (NOVOLOG MIX 70/30) (70-30) 100 UNIT/ML injection Inject 40 Units into the skin 2 (two) times daily before a meal.     . latanoprost (XALATAN) 0.005 % ophthalmic solution Place 1 drop into both eyes at bedtime.    Marland Kitchen loratadine (CLARITIN) 10 MG tablet Take 10 mg by mouth daily as needed (for seasonal allergies).     . losartan (COZAAR) 100 MG tablet Take 100 mg by mouth daily.    . Multiple Vitamin (MULTIVITAMIN) tablet Take 1 tablet by mouth daily.      .  nitroGLYCERIN (NITROSTAT) 0.4 MG SL tablet Place 1 tablet (0.4 mg total) under the tongue every 5 (five) minutes as needed for chest pain (up to 3 doses). 25 tablet 3  . pantoprazole (PROTONIX) 40 MG tablet Take 1 tablet (40 mg total) by mouth 2 (two) times daily. (Patient taking differently: Take 40 mg by mouth daily. ) 180 tablet 3  . PRESCRIPTION MEDICATION CPAP: At bedtime    . tamsulosin (FLOMAX) 0.4 MG CAPS capsule Take 0.4 mg by mouth every evening.     . Zinc 50 MG TABS     . isosorbide mononitrate (IMDUR) 30 MG 24 hr tablet Take 1 tablet (30 mg total) by mouth daily. (Patient not taking: Reported on 12/30/2018) 30 tablet 6   No current facility-administered medications for this visit.    ROS:   General:  No weight loss, Fever, chills  HEENT: No recent headaches, no nasal bleeding, no visual changes, no sore throat  Neurologic: No dizziness, blackouts, seizures. No recent symptoms of stroke or mini- stroke. No recent episodes of slurred speech, or temporary blindness.  Cardiac: No recent episodes of chest pain/pressure, no shortness of breath at rest.  + shortness of breath with exertion.  Denies history of atrial fibrillation or irregular heartbeat  Vascular: No history of rest pain in feet.  No history of claudication.  + history of non-healing ulcer, No history of DVT   Pulmonary: No home oxygen, no productive cough, no hemoptysis,  No asthma or wheezing  Musculoskeletal:  [ ]  Arthritis, [ ]  Low back pain,  [ ]  Joint pain  Hematologic:No history of hypercoagulable state.  No history of easy bleeding.  No history of anemia  Gastrointestinal: No hematochezia or melena,  No gastroesophageal reflux, no trouble swallowing  Urinary: [X]  chronic Kidney disease, [ ]  on HD - [ ]  MWF or [ ]  TTHS, [ ]  Burning with urination, [ ]  Frequent urination, [ ]  Difficulty urinating;   Skin: No rashes  Psychological: No history of anxiety,  No history of depression   Physical  Examination  Vitals:   01/03/20 1116  BP: (!) 148/75  Pulse: 68  Resp: 20  Temp: 97.8 F (36.6 C)  SpO2: 98%  Weight: 219 lb (99.3 kg)  Height: 6' (1.829 m)    Body mass index is 29.7 kg/m.  General:  Alert and oriented, no acute distress HEENT: Normal Neck: No JVD Cardiac: Regular Rate and Rhythm Abdomen: Soft, non-tender, non-distended, no mass, large ventral hernia with well-healed lower midline scar Skin: No rash, erythema extending from the fifth metatarsal onto the dorsum and lateral aspect of the right foot.  Some mild erythema in the pretibial area right leg. Extremity Pulses:  2+ radial, brachial, 1+ femoral at the very upper portion of the inguinal ligament, absent popliteal dorsalis pedis, posterior tibial pulses bilaterally Musculoskeletal: No deformity trace right ankle and foot edema  Neurologic: Upper and lower extremity motor 5/5 and symmetric  DATA:  Patient had bilateral ABIs performed today which were 0.55 on the right 1.08 on the left  ASSESSMENT: Cellulitis most likely osteomyelitis of the right fifth metatarsal head with decreased ABI in the right leg   PLAN: Patient will be admitted today for IV antibiotics.  We will start hydrating him today with the plan to do an arteriogram tomorrow if his creatinine is reasonable.  He will need I&D of his right foot eventually and we will get an MRI to determine extent of infection in that.  Most likely he is going to need resection of the right fifth metatarsal head at a minimum.  Risk benefits possible complications of procedure details of arteriogram were discussed with the patient today as well as the possibility that he is a limb threatening situation.  He understands and wishes to proceed.   [name], MD [facility] [place] Office: [phone] Pager: [phone]

## 2020-01-03 NOTE — Progress Notes (Signed)
Pharmacy Antibiotic Note  Scott [name] is a 86 y.o. male admitted on 01/03/2020 with cellulitis.  Pharmacy has been consulted for vancomycin dosing.  CKD3 SCr 1.73 (BL ~1.7-1.8)  Plan: Vancomycin 1500 mg IV x 1, then 750 mg IV every 24 hours Goal AUC 400-550. Expected AUC: 459 SCr used: 1.73 Monitor renal function, I&D, VVS plans, LOT Vancomycin levels at steady state  Height: 6' (182.9 cm) Weight: 215 lb 13.3 oz (97.9 kg) IBW/kg (Calculated) : 77.6  Temp (24hrs), Avg:98.1 F (36.7 C), Min:97.8 F (36.6 C), Max:98.3 F (36.8 C)  No results for input(s): WBC, CREATININE, LATICACIDVEN, VANCOTROUGH, VANCOPEAK, VANCORANDOM, GENTTROUGH, GENTPEAK, GENTRANDOM, TOBRATROUGH, TOBRAPEAK, TOBRARND, AMIKACINPEAK, AMIKACINTROU, AMIKACIN in the last 168 hours.  CrCl cannot be calculated (Patient's most recent lab result is older than the maximum 21 days allowed.).    No Known Allergies  Bertis Ruddy, PharmD Clinical Pharmacist Please check AMION for all [place] numbers 01/03/2020 2:34 PM

## 2020-01-04 ENCOUNTER — Inpatient Hospital Stay (HOSPITAL_COMMUNITY): Payer: Medicare HMO

## 2020-01-04 ENCOUNTER — Encounter (HOSPITAL_COMMUNITY)
Admission: AD | Disposition: A | Discharge status: Home Health | Payer: Self-pay | Source: Ambulatory Visit | Attending: Vascular Surgery

## 2020-01-04 ENCOUNTER — Encounter (HOSPITAL_COMMUNITY): Payer: Self-pay | Admitting: Vascular Surgery

## 2020-01-04 DIAGNOSIS — M86071 Acute hematogenous osteomyelitis, right ankle and foot: Secondary | ICD-10-CM

## 2020-01-04 DIAGNOSIS — Z01810 Encounter for preprocedural cardiovascular examination: Secondary | ICD-10-CM

## 2020-01-04 HISTORY — PX: ABDOMINAL AORTOGRAM W/LOWER EXTREMITY: CATH118223

## 2020-01-04 LAB — BASIC METABOLIC PANEL
Anion gap: 6 (ref 5–15)
BUN: 27 mg/dL — ABNORMAL HIGH (ref 8–23)
CO2: 27 mmol/L (ref 22–32)
Calcium: 8.1 mg/dL — ABNORMAL LOW (ref 8.9–10.3)
Chloride: 102 mmol/L (ref 98–111)
Creatinine, Ser: 1.48 mg/dL — ABNORMAL HIGH (ref 0.61–1.24)
GFR calc Af Amer: 50 mL/min — ABNORMAL LOW (ref >60)
GFR calc non Af Amer: 43 mL/min — ABNORMAL LOW (ref >60)
Glucose, Bld: 179 mg/dL — ABNORMAL HIGH (ref 70–99)
Potassium: 4.9 mmol/L (ref 3.5–5.1)
Sodium: 135 mmol/L (ref 135–145)

## 2020-01-04 LAB — URINALYSIS, ROUTINE W REFLEX MICROSCOPIC
Bacteria, UA: NONE SEEN
Bilirubin Urine: NEGATIVE
Glucose, UA: 50 mg/dL — AB
Hgb urine dipstick: NEGATIVE
Ketones, ur: NEGATIVE mg/dL
Leukocytes,Ua: NEGATIVE
Nitrite: NEGATIVE
Protein, ur: 100 mg/dL — AB
Specific Gravity, Urine: 1.013 (ref 1.005–1.030)
pH: 5 (ref 5.0–8.0)

## 2020-01-04 LAB — GLUCOSE, CAPILLARY
Glucose-Capillary: 196 mg/dL — ABNORMAL HIGH (ref 70–99)
Glucose-Capillary: 167 mg/dL — ABNORMAL HIGH (ref 70–99)
Glucose-Capillary: 176 mg/dL — ABNORMAL HIGH (ref 70–99)
Glucose-Capillary: 199 mg/dL — ABNORMAL HIGH (ref 70–99)

## 2020-01-04 SURGERY — ABDOMINAL AORTOGRAM W/LOWER EXTREMITY
Anesthesia: LOCAL | Laterality: Bilateral

## 2020-01-04 MED ORDER — LIDOCAINE HCL (PF) 1 % IJ SOLN
INTRAMUSCULAR | Status: DC | PRN
  Administered 2020-01-04: 20 mL

## 2020-01-04 MED ORDER — FLUTICASONE PROPIONATE 50 MCG/ACT NA SUSP
2.0000 | Freq: Every day | NASAL | Status: DC | PRN
  Filled 2020-01-04: qty 16

## 2020-01-04 MED ORDER — GABAPENTIN 300 MG PO CAPS
300.0000 mg | ORAL_CAPSULE | Freq: Every day | ORAL | Status: DC
  Administered 2020-01-04 – 2020-01-08 (×5): 300 mg via ORAL
  Filled 2020-01-04 (×5): qty 1

## 2020-01-04 MED ORDER — FUROSEMIDE 40 MG PO TABS: 40.0000 mg | ORAL_TABLET | Freq: Two times a day (BID) | ORAL | Status: DC

## 2020-01-04 MED ORDER — HEPARIN (PORCINE) IN NACL 1000-0.9 UT/500ML-% IV SOLN
INTRAVENOUS | Status: DC | PRN
  Administered 2020-01-04: 500 mL

## 2020-01-04 MED ORDER — OXYCODONE HCL 5 MG PO TABS
5.0000 mg | ORAL_TABLET | ORAL | Status: DC | PRN
  Administered 2020-01-04 – 2020-01-07 (×4): 10 mg via ORAL
  Filled 2020-01-04 (×4): qty 2

## 2020-01-04 MED ORDER — SODIUM CHLORIDE 0.9 % IV SOLN: Freq: Once | INTRAVENOUS | Status: AC

## 2020-01-04 MED ORDER — LIDOCAINE HCL (PF) 1 % IJ SOLN
INTRAMUSCULAR | Status: AC
  Filled 2020-01-04: qty 30

## 2020-01-04 MED ORDER — COLCHICINE 0.6 MG PO TABS: 0.6000 mg | ORAL_TABLET | Freq: Three times a day (TID) | ORAL | Status: DC | PRN

## 2020-01-04 MED ORDER — GADOBUTROL 1 MMOL/ML IV SOLN
10.0000 mL | Freq: Once | INTRAVENOUS | Status: AC | PRN
  Administered 2020-01-04: 10 mL via INTRAVENOUS

## 2020-01-04 MED ORDER — LATANOPROST 0.005 % OP SOLN
1.0000 [drp] | Freq: Every day | OPHTHALMIC | Status: DC
  Administered 2020-01-04 – 2020-01-08 (×5): 1 [drp] via OPHTHALMIC
  Filled 2020-01-04: qty 2.5

## 2020-01-04 MED ORDER — SODIUM CHLORIDE 0.9% FLUSH
3.0000 mL | Freq: Two times a day (BID) | INTRAVENOUS | Status: DC
  Administered 2020-01-05 – 2020-01-07 (×3): 3 mL via INTRAVENOUS

## 2020-01-04 MED ORDER — SODIUM CHLORIDE 0.9 % IV SOLN: INTRAVENOUS | Status: AC

## 2020-01-04 MED ORDER — INSULIN ASPART PROT & ASPART (70-30 MIX) 100 UNIT/ML ~~LOC~~ SUSP
35.0000 [IU] | Freq: Two times a day (BID) | SUBCUTANEOUS | Status: DC
  Administered 2020-01-05 – 2020-01-08 (×3): 35 [IU] via SUBCUTANEOUS
  Filled 2020-01-04: qty 10

## 2020-01-04 MED ORDER — ASPIRIN EC 81 MG PO TBEC
81.0000 mg | DELAYED_RELEASE_TABLET | Freq: Every day | ORAL | Status: DC
  Administered 2020-01-04 – 2020-01-08 (×5): 81 mg via ORAL
  Filled 2020-01-04 (×5): qty 1

## 2020-01-04 MED ORDER — CARVEDILOL 12.5 MG PO TABS
12.5000 mg | ORAL_TABLET | Freq: Two times a day (BID) | ORAL | Status: DC
  Administered 2020-01-04 – 2020-01-09 (×11): 12.5 mg via ORAL
  Filled 2020-01-04 (×12): qty 1

## 2020-01-04 MED ORDER — LABETALOL HCL 5 MG/ML IV SOLN
10.0000 mg | INTRAVENOUS | Status: DC | PRN
  Administered 2020-01-04: 10 mg via INTRAVENOUS

## 2020-01-04 MED ORDER — ONDANSETRON HCL 4 MG/2ML IJ SOLN
4.0000 mg | Freq: Four times a day (QID) | INTRAMUSCULAR | Status: DC | PRN
  Administered 2020-01-04 – 2020-01-07 (×2): 4 mg via INTRAVENOUS
  Filled 2020-01-04 (×2): qty 2

## 2020-01-04 MED ORDER — PANTOPRAZOLE SODIUM 40 MG PO TBEC: 40.0000 mg | DELAYED_RELEASE_TABLET | Freq: Every day | ORAL | Status: DC

## 2020-01-04 MED ORDER — SODIUM CHLORIDE 0.9 % IV SOLN: 250.0000 mL | INTRAVENOUS | Status: DC | PRN

## 2020-01-04 MED ORDER — ATORVASTATIN CALCIUM 80 MG PO TABS
80.0000 mg | ORAL_TABLET | Freq: Every day | ORAL | Status: DC
  Administered 2020-01-04 – 2020-01-09 (×6): 80 mg via ORAL
  Filled 2020-01-04 (×6): qty 1

## 2020-01-04 MED ORDER — PIPERACILLIN-TAZOBACTAM 3.375 G IVPB
3.3750 g | Freq: Three times a day (TID) | INTRAVENOUS | Status: DC
  Administered 2020-01-04 – 2020-01-09 (×16): 3.375 g via INTRAVENOUS
  Filled 2020-01-04 (×15): qty 50

## 2020-01-04 MED ORDER — ACETAMINOPHEN 325 MG PO TABS: 650.0000 mg | ORAL_TABLET | ORAL | Status: DC | PRN

## 2020-01-04 MED ORDER — IODIXANOL 320 MG/ML IV SOLN
INTRAVENOUS | Status: DC | PRN
  Administered 2020-01-04: 82 mL

## 2020-01-04 MED ORDER — HEPARIN (PORCINE) IN NACL 1000-0.9 UT/500ML-% IV SOLN
INTRAVENOUS | Status: AC
  Filled 2020-01-04: qty 1000

## 2020-01-04 MED ORDER — TAMSULOSIN HCL 0.4 MG PO CAPS
0.4000 mg | ORAL_CAPSULE | Freq: Every evening | ORAL | Status: DC
  Administered 2020-01-04 – 2020-01-09 (×6): 0.4 mg via ORAL
  Filled 2020-01-04 (×6): qty 1

## 2020-01-04 MED ORDER — LABETALOL HCL 5 MG/ML IV SOLN
INTRAVENOUS | Status: AC
  Filled 2020-01-04: qty 4

## 2020-01-04 MED ORDER — MORPHINE SULFATE (PF) 2 MG/ML IV SOLN: 2.0000 mg | INTRAVENOUS | Status: DC | PRN

## 2020-01-04 MED ORDER — SODIUM CHLORIDE 0.9% FLUSH: 3.0000 mL | INTRAVENOUS | Status: DC | PRN

## 2020-01-04 MED ORDER — LORATADINE 10 MG PO TABS: 10.0000 mg | ORAL_TABLET | Freq: Every day | ORAL | Status: DC | PRN

## 2020-01-04 MED ORDER — LOSARTAN POTASSIUM 50 MG PO TABS
100.0000 mg | ORAL_TABLET | Freq: Every day | ORAL | Status: DC
  Administered 2020-01-04 – 2020-01-07 (×4): 100 mg via ORAL
  Filled 2020-01-04 (×4): qty 2

## 2020-01-04 MED ORDER — ALLOPURINOL 100 MG PO TABS
100.0000 mg | ORAL_TABLET | Freq: Two times a day (BID) | ORAL | Status: DC
  Administered 2020-01-04 – 2020-01-09 (×11): 100 mg via ORAL
  Filled 2020-01-04 (×11): qty 1

## 2020-01-04 MED ORDER — NITROGLYCERIN 0.4 MG SL SUBL: 0.4000 mg | SUBLINGUAL_TABLET | SUBLINGUAL | Status: DC | PRN

## 2020-01-04 MED ORDER — HYDRALAZINE HCL 20 MG/ML IJ SOLN: 5.0000 mg | INTRAMUSCULAR | Status: DC | PRN

## 2020-01-04 SURGICAL SUPPLY — 11 items
CATH ANGIO 5F PIGTAIL 65CM (CATHETERS) ×4 IMPLANT
CATH CROSS OVER TEMPO 5F (CATHETERS) ×2 IMPLANT
CATH STRAIGHT 5FR 65CM (CATHETERS) ×2 IMPLANT
GUIDEWIRE ANGLED .035X150CM (WIRE) ×2 IMPLANT
KIT PV (KITS) ×2 IMPLANT
SHEATH PINNACLE 5F 10CM (SHEATH) ×2 IMPLANT
SYR MEDRAD MARK V 150ML (SYRINGE) ×2 IMPLANT
TRANSDUCER W/STOPCOCK (MISCELLANEOUS) ×2 IMPLANT
TRAY PV CATH (CUSTOM PROCEDURE TRAY) ×2 IMPLANT
WIRE BENTSON .035X145CM (WIRE) ×2 IMPLANT
WIRE HITORQ VERSACORE ST 145CM (WIRE) ×2 IMPLANT

## 2020-01-04 NOTE — Progress Notes (Signed)
MOBILITY TEAM - Progress Note   01/04/20 1342  Mobility  Activity Ambulated in room  Level of Assistance Modified independent, requires aide device or extra time  Assistive Device Front wheel walker  Distance Ambulated (ft) 14 ft  Mobility Response Tolerated fair  Mobility performed by Mobility specialist  Bed Position Chair   Patient agreeable to ambulate around bed to sit in recliner; mod indep with rolling walker to offload painful RLE. Hopeful for procedure soon so he can eat.  Mabeline Caras, PT, DPT Mobility Team Pager [phone]

## 2020-01-04 NOTE — Progress Notes (Signed)
Bilateral lower extremity vein mapping completed.  Preliminary results can be found under CV proc under chart review.  01/04/2020 11:39 AM  Tremond Shimabukuro, K., RDMS, RVT

## 2020-01-04 NOTE — Op Note (Signed)
Procedure: Abdominal aortogram with right lower extremity runoff  Preoperative diagnosis: Osteomyelitis right foot  Postoperative diagnosis: Same  Anesthesia: Local  Operative findings: 1.  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  One-vessel anterior tibial runoff to the right foot.  Complete to the plantar arch.  There is a 70% stenosis just adjacent to the origin of the anterior tibial artery that extends over about 3 mm.  3.  Right profunda stenosis 90% at origin  4.  Possible right popliteal aneurysm will clarify with ultrasound post procedure.  Operative details: After obtaining form consent, the patient taken the [facility] lab.  The patient was placed in supine position angio table.  Both groins were prepped and draped in usual sterile fashion.  Local anesthesia was infiltrated over the left common femoral artery.  Femoral bifurcation was identified as well.  Image was obtained for the patient's record.  Next local anesthesia was infiltrated directly over the femoral artery and using ultrasound guidance an introducer needle was used to cannulate the left common femoral artery and an 035 versa core wire threaded abdominal aorta under fluoroscopic guidance.  Next a 5 French sheath placed over the guidewire and left common femoral artery and thoroughly flushed with heparinized saline.  5 French pigtail catheter was advanced over the versa core wire.  However the wire became stuck within the pigtail catheter and I could not advance this or withdraw it.  Therefore the entire catheter was removed.  A 035 Bentson wire was then brought up advanced through the sheath up in the abdominal aorta and a 5 French pigtail catheter advanced over this.  Abdominal aortogram was then obtained through the pigtail catheter in AP projection.  Left and right renal arteries are patent.  Infrarenal abdominal aorta is patent.  Left and right common external  and internal iliac arteries are widely patent.  Next the pigtail catheter was pulled on distal by the aortic bifurcation and a pelvic angiogram was obtained in a 30 degree LAO view.  This again confirmed the above findings.  At this point the pigtail catheter was removed over a guidewire and exchanged out for a 5 [place] crossover catheter.  This was used to selectively catheterize the right common iliac artery and then an 035 angled Glidewire advanced down into the right profunda femoris artery.  Crossover catheter was removed and swapped out for a 5 French straight catheter which was advanced down to the distal right external iliac artery.  Right lower extremity arteriogram was then obtained through this.  The right common femoral artery is patent.  The right superficial femoral artery is patent.  There is a 90% stenosis at the origin of the right profunda.  The right popliteal artery is patent.  There is a calcified structure in the mid popliteal artery almost posterior to the knee that could possibly represent a popliteal aneurysm.  The below-knee popliteal artery is patent.  The anterior tibial artery is patent and there is about a 70% stenosis at its origin that extends over a few millimeters.  The remainder of the artery is patent all the way to the level of the foot which reconstitutes the entire plantar arch.  Posterior tibial and peroneal arteries are occluded.  There is some late filling of the posterior tibial artery at the level of the ankle.  At this point a 5 [place] straight catheter was removed over guidewire.  I decided not to intervene on the very  short segment anterior tibial artery stenosis as this is his only runoff vessel and I am not sure that treating a segment that is less than 3 mm in length will do a large amount to improve his overall perfusion.  The patient tired procedure well and there were no complications.  Patient taken the holding area in stable condition.  The sheath be pulled  in the holding area.  Operative management: Patient will be kept on IV antibiotics for osteomyelitis of his right foot.  If this does not improve or starts to deteriorate or he ends up needing resection of the metatarsal head he may need angioplasty of the origin of his right anterior tibial artery.  Hopefully he will have adequate perfusion that he will not need this procedure in the future.  [name], MD [facility] Office: [phone]

## 2020-01-04 NOTE — Progress Notes (Signed)
Requested for CPAP, Pt stated he forgot to bring it from home. RT notified, SPO2 room air when sleep 83-89%, SPO2 when awake 92-98%. Auscultated lungs clear bilaterally. Other vital signs remained stable. Right foot small ulcer with minimal drainage , cleaned with NSS and dressing changed. Pain tolerated well. Oxycodone 10 mg given at bedtime. We got good doppler signals on PD and PD pulses bilateral lower extremities.  Will monitor.   Kennyth Lose, RN

## 2020-01-04 NOTE — Progress Notes (Signed)
Patient placed on PPV with 4L.  HR 69 and Saturation 95%

## 2020-01-04 NOTE — Progress Notes (Signed)
Site area- left  Site Prior to Removal- 0   Pressure Applied For-  20 MInutes   Bedrest Beginning at - 1710   Manual- Yes   Patient Status During Pull- Stable    Post Pull Groin Site- 0   Post Pull Instructions Given- Yes   Post Pull Pulses Present- Yes    Dressing Applied- Tegaderm and Gauze Dressing    Comments:  tol well

## 2020-01-04 NOTE — Progress Notes (Signed)
Pt. States he can place cpap on when he is ready. RT informed pt. To notify if he needed any assistance.

## 2020-01-04 NOTE — Interval H&P Note (Signed)
History and Physical Interval Note:  01/04/2020 3:50 PM  Scott [name]  has presented today for surgery, with the diagnosis of PAD with nonhealing wound.  The various methods of treatment have been discussed with the patient and family. After consideration of risks, benefits and other options for treatment, the patient has consented to  Procedure(s): ABDOMINAL AORTOGRAM W/LOWER EXTREMITY (Bilateral) as a surgical intervention.  The patient's history has been reviewed, patient examined, no change in status, stable for surgery.  I have reviewed the patient's chart and labs.  Questions were answered to the patient's satisfaction.     [name]

## 2020-01-04 NOTE — Progress Notes (Addendum)
[facility]  Subjective  - No new complaints.   Objective (!) 142/78 73 98.2 F (36.8 C) (Oral) 18 94%  Intake/Output Summary (Last 24 hours) at 01/04/2020 0719 Last data filed at 01/04/2020 0500 Gross per 24 hour  Intake 2207.35 ml  Output 900 ml  Net 1307.35 ml    Right foot edema with cellulitis.  Non palpable pedal pulses, active motor intact Lower leg without edema Plantar skin abrasion, no active drainage Lungs non labored breathing  MRI right foot IMPRESSION: Area of ulceration overlying the plantar surface of the fifth metatarsal head with a probable tiny sinus tract. Findings suggestive of early acute osteomyelitis involving the fifth metatarsal head and distal metatarsal shaft.  Assessment/Planning: Right foot cellulitis with 5th MT wound MRI suggested early signs of osteomyolitis  He has been started on Vanco IV and fluid resuscitation.  Cr 1.73 Dr. Oneida [name] has ordered a bolus of fluid and I will hold his lasix. He will need I & D of the right foot wound  Angiogram scheduled for today with possible intervention.  NPO Continue IV antibiotics for now and observe.  Scott [name] 01/04/2020 7:19 AM --  Laboratory Lab Results: Recent Labs    01/03/20 1500  WBC 8.4  HGB 11.7*  HCT 37.6*  PLT 150   BMET Recent Labs    01/03/20 1500  NA 132*  K 5.5*  CL 99  CO2 24  GLUCOSE 228*  BUN 33*  CREATININE 1.73*  CALCIUM 8.6*    COAG Lab Results  Component Value Date   INR 1.1 01/03/2020   INR 1.1 06/09/2017   INR 0.99 05/12/2013   No results found for: PTT

## 2020-01-05 ENCOUNTER — Inpatient Hospital Stay (HOSPITAL_COMMUNITY): Payer: Medicare HMO

## 2020-01-05 DIAGNOSIS — I739 Peripheral vascular disease, unspecified: Secondary | ICD-10-CM

## 2020-01-05 LAB — CBC
HCT: 34.3 % — ABNORMAL LOW (ref 39.0–52.0)
Hemoglobin: 10.4 g/dL — ABNORMAL LOW (ref 13.0–17.0)
MCH: 28.9 pg (ref 26.0–34.0)
MCHC: 30.3 g/dL (ref 30.0–36.0)
MCV: 95.3 fL (ref 80.0–100.0)
Platelets: 159 10*3/uL (ref 150–400)
RBC: 3.6 MIL/uL — ABNORMAL LOW (ref 4.22–5.81)
RDW: 14 % (ref 11.5–15.5)
WBC: 7.2 10*3/uL (ref 4.0–10.5)
nRBC: 0 % (ref 0.0–0.2)

## 2020-01-05 LAB — BASIC METABOLIC PANEL
Anion gap: 9 (ref 5–15)
BUN: 27 mg/dL — ABNORMAL HIGH (ref 8–23)
CO2: 23 mmol/L (ref 22–32)
Calcium: 8.2 mg/dL — ABNORMAL LOW (ref 8.9–10.3)
Chloride: 105 mmol/L (ref 98–111)
Creatinine, Ser: 1.76 mg/dL — ABNORMAL HIGH (ref 0.61–1.24)
GFR calc Af Amer: 40 mL/min — ABNORMAL LOW (ref >60)
GFR calc non Af Amer: 35 mL/min — ABNORMAL LOW (ref >60)
Glucose, Bld: 191 mg/dL — ABNORMAL HIGH (ref 70–99)
Potassium: 4.9 mmol/L (ref 3.5–5.1)
Sodium: 137 mmol/L (ref 135–145)

## 2020-01-05 LAB — GLUCOSE, CAPILLARY
Glucose-Capillary: 155 mg/dL — ABNORMAL HIGH (ref 70–99)
Glucose-Capillary: 108 mg/dL — ABNORMAL HIGH (ref 70–99)
Glucose-Capillary: 151 mg/dL — ABNORMAL HIGH (ref 70–99)
Glucose-Capillary: 73 mg/dL (ref 70–99)

## 2020-01-05 NOTE — Progress Notes (Signed)
   VASCULAR SURGERY ASSESSMENT & PLAN:   S/P ARTERIOGRAM YESTERDAY: His arteriogram showed single-vessel runoff via the anterior tibial artery with a focal proximal stenosis.  Dr. Oneida Alar only plans attempting tibial angioplasty if the wound worsens.  Given that this is his only runoff angioplasty would be associated with increased risk which is why we would only consider this if absolutely necessary.  ID: He is on intravenous Vanco and intravenous Zosyn.  The plan is for him to get a PICC line on Monday and to go home with 4 to 6 weeks of intravenous antibiotics.  STAGE 3 CKD: His creatinine is up slightly to 1.76 today.  I believe that Dr. Oneida Alar minimize contrast yesterday.  [  ] He is scheduled for a duplex of the right lower extremity today to rule out a right popliteal artery aneurysm.  SUBJECTIVE:   No complaints this morning.  PHYSICAL EXAM:   Vitals:   01/04/20 1705 01/04/20 2023 01/04/20 2321 01/05/20 0404  BP: (!) 150/67 (!) 141/73 (!) 151/73 104/63  Pulse: 74 74 78 72  Resp: 18 18 (!) 27 (!) 26  Temp:  98.4 F (36.9 C) 98.2 F (36.8 C) 98 F (36.7 C)  TempSrc:  Oral Oral Oral  SpO2: 94% 94% (!) 80% (!) 89%  Weight:      Height:       His left groin site looks fine without hematoma. The right foot is warm and well-perfused. No change in the wound on the plantar aspect of his right foot.  LABS:   Lab Results  Component Value Date   WBC 7.2 01/05/2020   HGB 10.4 (L) 01/05/2020   HCT 34.3 (L) 01/05/2020   MCV 95.3 01/05/2020   PLT 159 01/05/2020   Lab Results  Component Value Date   CREATININE 1.76 (H) 01/05/2020   Lab Results  Component Value Date   INR 1.1 01/03/2020   CBG (last 3)  Recent Labs    01/04/20 1455 01/04/20 2105 01/05/20 0553  GLUCAP 176* 199* 155*    PROBLEM LIST:    Active Problems:   PAD (peripheral artery disease) (HCC)   CURRENT MEDS:   . allopurinol  100 mg Oral BID  . aspirin EC  81 mg Oral QHS  . atorvastatin  80 mg  Oral Daily  . carvedilol  12.5 mg Oral BID WC  . docusate sodium  100 mg Oral BID  . gabapentin  300 mg Oral QHS  . heparin  5,000 Units Subcutaneous Q8H  . insulin aspart  0-9 Units Subcutaneous TID WC  . insulin aspart protamine- aspart  35 Units Subcutaneous BID AC  . latanoprost  1 drop Both Eyes QHS  . losartan  100 mg Oral Daily  . pantoprazole  40 mg Oral Daily  . potassium chloride  20-40 mEq Oral Once  . sodium chloride flush  3 mL Intravenous Q12H  . tamsulosin  0.4 mg Oral QPM    Deitra Mayo Office: [phone] 01/05/2020

## 2020-01-05 NOTE — Plan of Care (Signed)
  Problem: Coping: Goal: Ability to adjust to condition or change in health will improve Outcome: Progressing   Problem: Fluid Volume: Goal: Ability to maintain a balanced intake and output will improve Outcome: Progressing   Problem: Health Behavior/Discharge Planning: Goal: Ability to identify and utilize available resources and services will improve Outcome: Progressing Goal: Ability to manage health-related needs will improve Outcome: Progressing   Problem: Metabolic: Goal: Ability to maintain appropriate glucose levels will improve Outcome: Progressing   Problem: Nutritional: Goal: Maintenance of adequate nutrition will improve Outcome: Progressing Goal: Progress toward achieving an optimal weight will improve Outcome: Progressing   Problem: Skin Integrity: Goal: Risk for impaired skin integrity will decrease Outcome: Progressing   Problem: Skin Integrity: Goal: Risk for impaired skin integrity will decrease Outcome: Progressing   Problem: Clinical Measurements: Goal: Ability to avoid or minimize complications of infection will improve Outcome: Progressing   Problem: Skin Integrity: Goal: Skin integrity will improve Outcome: Progressing

## 2020-01-05 NOTE — Progress Notes (Signed)
VASCULAR LAB PRELIMINARY  PRELIMINARY  PRELIMINARY  PRELIMINARY  Duplex scan of bilateral popliteal arteries completed.    Preliminary report:  Se CV proc for preliminary results.   Ezel Vallone, RVT 01/05/2020, 2:15 PM

## 2020-01-06 LAB — VANCOMYCIN, PEAK: Vancomycin Pk: 40 ug/mL (ref 30–40)

## 2020-01-06 LAB — GLUCOSE, CAPILLARY
Glucose-Capillary: 150 mg/dL — ABNORMAL HIGH (ref 70–99)
Glucose-Capillary: 183 mg/dL — ABNORMAL HIGH (ref 70–99)
Glucose-Capillary: 191 mg/dL — ABNORMAL HIGH (ref 70–99)
Glucose-Capillary: 211 mg/dL — ABNORMAL HIGH (ref 70–99)
Glucose-Capillary: 36 mg/dL — CL (ref 70–99)
Glucose-Capillary: 56 mg/dL — ABNORMAL LOW (ref 70–99)
Glucose-Capillary: 73 mg/dL (ref 70–99)

## 2020-01-06 NOTE — Plan of Care (Signed)
  Problem: Coping: Goal: Ability to adjust to condition or change in health will improve Outcome: Progressing   Problem: Fluid Volume: Goal: Ability to maintain a balanced intake and output will improve Outcome: Progressing   Problem: Health Behavior/Discharge Planning: Goal: Ability to identify and utilize available resources and services will improve Outcome: Progressing Goal: Ability to manage health-related needs will improve Outcome: Progressing   Problem: Metabolic: Goal: Ability to maintain appropriate glucose levels will improve Outcome: Progressing   Problem: Nutritional: Goal: Maintenance of adequate nutrition will improve Outcome: Progressing Goal: Progress toward achieving an optimal weight will improve Outcome: Progressing   Problem: Skin Integrity: Goal: Risk for impaired skin integrity will decrease Outcome: Progressing   Problem: Clinical Measurements: Goal: Ability to avoid or minimize complications of infection will improve Outcome: Progressing   Problem: Skin Integrity: Goal: Skin integrity will improve Outcome: Progressing

## 2020-01-06 NOTE — Progress Notes (Signed)
Hypoglycemic Event  CBG: 36  Treatment: 8 ounces of OJ, [place] sandwich  Symptoms: Sweating  Follow-up CBG: Time: 0115 CBG Result: 73  Possible Reasons for Event: Too much 70/30 insulin  Comments/MD notified: No    Elicia Lamp

## 2020-01-06 NOTE — Procedures (Signed)
Patient declined to wear CPAP tonight.  Informed patient to have RN call RT if he changes his mind.

## 2020-01-06 NOTE — Progress Notes (Signed)
   VASCULAR SURGERY ASSESSMENT & PLAN:   S/P ARTERIOGRAM 01/04/2020: His arteriogram showed single-vessel runoff via the anterior tibial artery with a focal proximal stenosis.  Dr. Oneida Alar only plans attempting tibial angioplasty if the wound worsens.  Given that this is his only runoff angioplasty would be associated with increased risk which is why we would only consider this if absolutely necessary.  ID: Early acute osteomyelitis involving the fifth metatarsal head and distal metatarsal shaft. He is on intravenous Vanco and intravenous Zosyn.  The plan is for him to get a PICC line on Monday and to go home with 4 to 6 weeks of intravenous antibiotics.  STAGE 3 CKD: His creatinine was up slightly to 1.76 yesterday.  I believe that Dr. Oneida Alar minimize contrast yesterday. Continues on NS @ 100 cc/hr.  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  SUBJECTIVE:   UOP: 700cc  PHYSICAL EXAM:   Vitals:   01/05/20 1128 01/05/20 1651 01/05/20 2001 01/06/20 0431  BP: (!) 104/53 125/71 127/60 126/63  Pulse: 63 71 72 60  Resp: (!) 23 19 20 18   Temp: 98 F (36.7 C) 98.4 F (36.9 C) 98.3 F (36.8 C) 98 F (36.7 C)  TempSrc: Oral Oral Oral Oral  SpO2: 96% 100% 98% 99%  Weight:      Height:       Right PoplitealAP (cm)Transv (cm)Waveform StenosisShapeComments  +---------------+-------+-----------+----------+--------+-----+--------+  Proximal    1.51  1.18    monophasic             +---------------+-------+-----------+----------+--------+-----+--------+   +--------------+-------+-----------+----------+--------+-----+--------+  Left PoplitealAP (cm)Transv (cm)Waveform StenosisShapeComments  +--------------+-------+-----------+----------+--------+-----+--------+  Mid      1.56  1.44    monophasic              +--------------+-------+-----------+----------+--------+-----+--------+   Summary:  Right: Aneurysm noted in the proximal to mid popliteal.   Left: Aneurysm noted in the proximal to mid popliteal.   LABS:   Lab Results  Component Value Date   WBC 7.2 01/05/2020   HGB 10.4 (L) 01/05/2020   HCT 34.3 (L) 01/05/2020   MCV 95.3 01/05/2020   PLT 159 01/05/2020   Lab Results  Component Value Date   CREATININE 1.76 (H) 01/05/2020   Lab Results  Component Value Date   INR 1.1 01/03/2020   CBG (last 3)  Recent Labs    01/06/20 0045 01/06/20 0116 01/06/20 0600  GLUCAP 56* 73 150*    PROBLEM LIST:    Active Problems:   PAD (peripheral artery disease) (HCC)   CURRENT MEDS:   . allopurinol  100 mg Oral BID  . aspirin EC  81 mg Oral QHS  . atorvastatin  80 mg Oral Daily  . carvedilol  12.5 mg Oral BID WC  . docusate sodium  100 mg Oral BID  . gabapentin  300 mg Oral QHS  . heparin  5,000 Units Subcutaneous Q8H  . insulin aspart  0-9 Units Subcutaneous TID WC  . insulin aspart protamine- aspart  35 Units Subcutaneous BID AC  . latanoprost  1 drop Both Eyes QHS  . losartan  100 mg Oral Daily  . pantoprazole  40 mg Oral Daily  . potassium chloride  20-40 mEq Oral Once  . sodium chloride flush  3 mL Intravenous Q12H  . tamsulosin  0.4 mg Oral QPM    Deitra Mayo Office: [phone] 01/06/2020

## 2020-01-06 NOTE — Progress Notes (Signed)
   VASCULAR SURGERY ASSESSMENT & PLAN:   S/P ARTERIOGRAM : His arteriogram showed single-vessel runoff via the anterior tibial artery with a focal proximal stenosis.  Dr. Oneida Alar only plans attempting tibial angioplasty if the wound worsens.  Given that this is his only runoff angioplasty would be associated with increased risk which is why we would only consider this if absolutely necessary.  ID: He is on intravenous Vanco and intravenous Zosyn.  The plan is for him to get a PICC line on Monday and to go home with 4 to 6 weeks of intravenous antibiotics.  STAGE 3 CKD: His creatinine was up slightly yesterday (1.76).  I have ordered a follow-up he met for tomorrow morning.  BILATERAL SMALL POPLITEAL ARTERY ANEURYSMS: I reviewed his duplex scan from yesterday.  The right popliteal artery measures 1.5 cm in maximum diameter.  The left popliteal artery measures 1.56 cm in maximum diameter.  We will follow these as an outpatient.   SUBJECTIVE:   No complaints this morning.  PHYSICAL EXAM:   Vitals:   01/05/20 1651 01/05/20 2001 01/06/20 0431 01/06/20 0821  BP: 125/71 127/60 126/63 132/71  Pulse: 71 72 60 60  Resp: _0 Temp: 98.4 F (36.9 C) 98.3 F (36.8 C) 98 F (36.7 C) 98.5 F (36.9 C)  TempSrc: Oral Oral Oral Oral  SpO2: 100% 98% 99% 98%  Weight:      Height:       No change on the small wound on his right foot. No hematoma left groin. The right foot is adequately perfused.  LABS:   Lab Results  Component Value Date   WBC 7.2 01/05/2020   HGB 10.4 (L) 01/05/2020   HCT 34.3 (L) 01/05/2020   MCV 95.3 01/05/2020   PLT 159 01/05/2020   Lab Results  Component Value Date   CREATININE 1.76 (H) 01/05/2020    CBG (last 3)  Recent Labs    01/06/20 0045 01/06/20 0116 01/06/20 0600  GLUCAP 56* 73 150*    PROBLEM LIST:    Active Problems:   PAD (peripheral artery disease) (HCC)   CURRENT MEDS:   . allopurinol  100 mg Oral BID  . aspirin EC  81 mg  Oral QHS  . atorvastatin  80 mg Oral Daily  . carvedilol  12.5 mg Oral BID WC  . docusate sodium  100 mg Oral BID  . gabapentin  300 mg Oral QHS  . heparin  5,000 Units Subcutaneous Q8H  . insulin aspart  0-9 Units Subcutaneous TID WC  . insulin aspart protamine- aspart  35 Units Subcutaneous BID AC  . latanoprost  1 drop Both Eyes QHS  . losartan  100 mg Oral Daily  . pantoprazole  40 mg Oral Daily  . potassium chloride  20-40 mEq Oral Once  . sodium chloride flush  3 mL Intravenous Q12H  . tamsulosin  0.4 mg Oral QPM    Deitra Mayo Office: [phone] 01/06/2020

## 2020-01-07 ENCOUNTER — Encounter: Payer: Self-pay | Admitting: Cardiology

## 2020-01-07 ENCOUNTER — Inpatient Hospital Stay: Payer: Self-pay

## 2020-01-07 LAB — BASIC METABOLIC PANEL
Anion gap: 9 (ref 5–15)
BUN: 43 mg/dL — ABNORMAL HIGH (ref 8–23)
CO2: 24 mmol/L (ref 22–32)
Calcium: 8.1 mg/dL — ABNORMAL LOW (ref 8.9–10.3)
Chloride: 104 mmol/L (ref 98–111)
Creatinine, Ser: 2.86 mg/dL — ABNORMAL HIGH (ref 0.61–1.24)
GFR calc Af Amer: 22 mL/min — ABNORMAL LOW (ref >60)
GFR calc non Af Amer: 19 mL/min — ABNORMAL LOW (ref >60)
Glucose, Bld: 189 mg/dL — ABNORMAL HIGH (ref 70–99)
Potassium: 5.4 mmol/L — ABNORMAL HIGH (ref 3.5–5.1)
Sodium: 137 mmol/L (ref 135–145)

## 2020-01-07 LAB — GLUCOSE, CAPILLARY
Glucose-Capillary: 130 mg/dL — ABNORMAL HIGH (ref 70–99)
Glucose-Capillary: 158 mg/dL — ABNORMAL HIGH (ref 70–99)
Glucose-Capillary: 129 mg/dL — ABNORMAL HIGH (ref 70–99)
Glucose-Capillary: 238 mg/dL — ABNORMAL HIGH (ref 70–99)

## 2020-01-07 LAB — VANCOMYCIN, TROUGH: Vancomycin Tr: 13 ug/mL — ABNORMAL LOW (ref 15–20)

## 2020-01-07 LAB — CREATININE, SERUM
Creatinine, Ser: 2.51 mg/dL — ABNORMAL HIGH (ref 0.61–1.24)
GFR calc Af Amer: 26 mL/min — ABNORMAL LOW (ref >60)
GFR calc non Af Amer: 23 mL/min — ABNORMAL LOW (ref >60)

## 2020-01-07 MED ORDER — CHLORHEXIDINE GLUCONATE CLOTH 2 % EX PADS
6.0000 | MEDICATED_PAD | Freq: Every day | CUTANEOUS | Status: DC
  Administered 2020-01-08 – 2020-01-09 (×3): 6 via TOPICAL

## 2020-01-07 MED ORDER — VANCOMYCIN HCL 750 MG/150ML IV SOLN
750.0000 mg | INTRAVENOUS | Status: DC
  Administered 2020-01-07 – 2020-01-09 (×3): 750 mg via INTRAVENOUS
  Filled 2020-01-07 (×4): qty 150

## 2020-01-07 MED ORDER — SODIUM CHLORIDE 0.9% FLUSH
10.0000 mL | Freq: Two times a day (BID) | INTRAVENOUS | Status: DC
  Administered 2020-01-07 – 2020-01-09 (×2): 10 mL

## 2020-01-07 MED ORDER — VANCOMYCIN VARIABLE DOSE PER UNSTABLE RENAL FUNCTION (PHARMACIST DOSING): Status: DC

## 2020-01-07 MED ORDER — SODIUM CHLORIDE 0.9% FLUSH: 10.0000 mL | INTRAVENOUS | Status: DC | PRN

## 2020-01-07 NOTE — Progress Notes (Signed)
MOBILITY TEAM - Progress Note   01/07/20 1348  Mobility  Activity Ambulated in hall  Level of Assistance Modified independent, requires aide device or extra time  Assistive Device Front wheel walker  Distance Ambulated (ft) 150 ft  Mobility Response Tolerated well  Mobility performed by Mobility specialist  Bed Position  (seated edge of bed)   Patient initially walking in room by holding onto IV pole, but also reaching to furniture for added support. Stability improved with use of rolling walker to offload RLE, pt agrees and request one for home use.  Mabeline Caras, PT, DPT Mobility Team Pager [phone]

## 2020-01-07 NOTE — Progress Notes (Signed)
Pharmacy Antibiotic Note  Scott [name] is a 86 y.o. male ws admitted on 01/03/2020 with cellulitis; pt has early osteo of 5th metatarsal head and distal shaft.  Pharmacy has been consulted for vancomycin dosing. Pt rec'd vancomycin 1500 mg IV X 1 and is receiving 750 mg IV Q 24 hrs, along with Zosyn.  Pt has CKD3; SCr today  Scr 2.51; CrCl 26.6 ml/min (Scr BL ~1.7-1.8) WBC 7.2, afebrile  Pt to have PICC line placed today, with plans to discharge home tomorrow pending improvement in Scr.  Vancomycin levels drawn around last dose of vancomycin 750 mg IV Q 24 hrs (given at 15:21 PM on 01/06/20) are are follows: Vancomycin peak drawn at 16:23 on 3/28 was 40 mg/L (level was drawn early, before vancomycin distribution was complete, so is likely higher than true vancomycin peak) Vancomycin trough (drawn at 14:07 PM today) was 13 mg/L Vancomycin AUC calculated using the above levels was 565.2, which is slightly above the goal AUC of 400-550; however, vanc peak was drawn early and pt is going on home on IV antibiotics, so regimen needs to be Q 24 hrs for feasibility/convenience reasons.  Scr drawn this afternoon is still elevated, but trending down. Will check Scr with AM labs tomorrow to evaluate renal function trend and make final decision on home vancomycin regimen.  Plan: Continue vancomycin 750 mg IV Q 24 hrs Monitor WBC, temp, clinical improvement, renal function, vancomycin levels Determine home vancomycin regimen, pending review of Scr with AM labs tomorrow  Height: 6' (182.9 cm) Weight: 215 lb 13.3 oz (97.9 kg) IBW/kg (Calculated) : 77.6  Temp (24hrs), Avg:97.9 F (36.6 C), Min:97.7 F (36.5 C), Max:98 F (36.7 C)  Recent Labs  Lab 01/03/20 1500 01/04/20 0918 01/05/20 0257 01/06/20 1623 01/07/20 0240 01/07/20 1407 01/07/20 1531  WBC 8.4  --  7.2  --   --   --   --   CREATININE 1.73* 1.48* 1.76*  --  2.86*  --  2.51*  VANCOTROUGH  --   --   --   --   --  13*  --   VANCOPEAK  --    --   --  40  --   --   --     Estimated Creatinine Clearance: 26.6 mL/min (A) (by C-G formula based on SCr of 2.51 mg/dL (H)).    No Known Allergies   Gillermina Hu, PharmD, BCPS, [facility] Clinical Pharmacist 01/07/2020 3:54 PM

## 2020-01-07 NOTE — Progress Notes (Signed)
Peripherally Inserted Central Catheter Placement  The IV Nurse has discussed with the patient and/or persons authorized to consent for the patient, the purpose of this procedure and the potential benefits and risks involved with this procedure.  The benefits include less needle sticks, lab draws from the catheter, and the patient may be discharged home with the catheter. Risks include, but not limited to, infection, bleeding, blood clot (thrombus formation), and puncture of an artery; nerve damage and irregular heartbeat and possibility to perform a PICC exchange if needed/ordered by physician.  Alternatives to this procedure were also discussed.  Bard Power PICC patient education guide, fact sheet on infection prevention and patient information card has been provided to patient /or left at bedside.    PICC Placement Documentation  PICC Single Lumen 15/61/53 PICC Left Basilic 50 cm 0 cm (Active)  Indication for Insertion or Continuance of Line Home intravenous therapies (PICC only) 01/07/20 2149  Exposed Catheter (cm) 0 cm 01/07/20 2149  Site Assessment Clean;Dry;Intact 01/07/20 2149  Line Status Flushed;Saline locked;Blood return noted 01/07/20 2149  Dressing Type Transparent 01/07/20 2149  Dressing Status Clean;Dry;Intact;Antimicrobial disc in place 01/07/20 2149  Dressing Change Due 01/14/20 01/07/20 2149       Gordan Payment 01/07/2020, 9:50 PM

## 2020-01-07 NOTE — Progress Notes (Addendum)
  Progress Note    01/07/2020 7:49 AM 3 Days Post-Op  Subjective:  Not in any pain. States he is just tired and hopeful to go home soon   Vitals:   01/06/20 2030 01/07/20 0510  BP: (!) 145/77 124/63  Pulse: (!) 39 63  Resp: (!) 28 (!) 21  Temp: 97.9 F (36.6 C)   SpO2: 97% 99%   Physical Exam: General: well nourished, well appearing, not in any acute discomfort Lungs:  Non labored Extremities:  2+ femoral pulses bilaterally, Dopper Dp right foot. Right foot edematous. Very mild erythema. Dressing intact. No drainage. Right foot warm. Motor and sensory intact. Left DP/PT Doppler signals. Left foot warm without edema. Motor and sensory intact Abdomen: distended, soft, non tender Neurologic: Alert and oriented  CBC    Component Value Date/Time   WBC 7.2 01/05/2020 0257   RBC 3.60 (L) 01/05/2020 0257   HGB 10.4 (L) 01/05/2020 0257   HGB 12.9 (L) 06/15/2017 1127   HCT 34.3 (L) 01/05/2020 0257   HCT 41.2 06/15/2017 1127   PLT 159 01/05/2020 0257   PLT 87 (LL) 06/15/2017 1127   MCV 95.3 01/05/2020 0257   MCV 95 06/15/2017 1127   MCH 28.9 01/05/2020 0257   MCHC 30.3 01/05/2020 0257   RDW 14.0 01/05/2020 0257   RDW 14.6 06/15/2017 1127   LYMPHSABS 0.6 (L) 10/18/2017 0702   MONOABS 0.5 10/18/2017 0702   EOSABS 0.0 10/18/2017 0702   BASOSABS 0.0 10/18/2017 0702    BMET    Component Value Date/Time   NA 137 01/07/2020 0240   NA 139 06/15/2017 1127   K 5.4 (H) 01/07/2020 0240   CL 104 01/07/2020 0240   CO2 24 01/07/2020 0240   GLUCOSE 189 (H) 01/07/2020 0240   BUN 43 (H) 01/07/2020 0240   BUN 21 06/15/2017 1127   CREATININE 2.86 (H) 01/07/2020 0240   CREATININE 1.29 12/30/2014 1352   CALCIUM 8.1 (L) 01/07/2020 0240   GFRNONAA 19 (L) 01/07/2020 0240   GFRAA 22 (L) 01/07/2020 0240    INR    Component Value Date/Time   INR 1.1 01/03/2020 1500     Intake/Output Summary (Last 24 hours) at 01/07/2020 0749 Last data filed at 01/07/2020 0500 Gross per 24 hour   Intake 1159.79 ml  Output 650 ml  Net 509.79 ml     Assessment/Plan:  86 y.o. male is s/p abdominal aortogram with right lower extremity runoff 3 Days Post-Op for severe tibial occlusive disease. He was found to have single AT runoff. No further intervention at this time unless right foot wound worsens due to concerns of intervention with single vessel runoff. 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 IV abx per Pharmacy. To get PICC line today. Creatinine is up 2.86. Will increase IV fluids and adjust Vanc dosing. Will need IV antibiotics for 4-6 weeks. Plan for discharge home tomorrow pending improved Cr  DVT prophylaxis:  Sq Heparin   Karoline Caldwell, PA-C [facility] [phone] 01/07/2020 7:49 AM   Foot less red.  No drainage. Still some edema. Rising creatinine ? ATN from contrast Will give IV hydration today to see if we can get back to baseline of 2 Get PICC today with plan to d/c when renal function stable Will do 6 weeks IV Vanc Zosyn Still at high risk to lose part of foot  [name], MD [facility] [place] Office: [phone]

## 2020-01-07 NOTE — Progress Notes (Signed)
Last dose of 70/30 insulin was Saturday p.m.(3/27) and sugar dropped to 30s and pt has refused scheduled doses since.  Vascular PA notified.  No new orders received.

## 2020-01-08 ENCOUNTER — Ambulatory Visit: Payer: Medicare HMO | Admitting: Internal Medicine

## 2020-01-08 LAB — GLUCOSE, CAPILLARY
Glucose-Capillary: 143 mg/dL — ABNORMAL HIGH (ref 70–99)
Glucose-Capillary: 183 mg/dL — ABNORMAL HIGH (ref 70–99)
Glucose-Capillary: 171 mg/dL — ABNORMAL HIGH (ref 70–99)
Glucose-Capillary: 136 mg/dL — ABNORMAL HIGH (ref 70–99)

## 2020-01-08 LAB — BASIC METABOLIC PANEL
Anion gap: 8 (ref 5–15)
BUN: 40 mg/dL — ABNORMAL HIGH (ref 8–23)
CO2: 24 mmol/L (ref 22–32)
Calcium: 8.5 mg/dL — ABNORMAL LOW (ref 8.9–10.3)
Chloride: 105 mmol/L (ref 98–111)
Creatinine, Ser: 2.33 mg/dL — ABNORMAL HIGH (ref 0.61–1.24)
GFR calc Af Amer: 29 mL/min — ABNORMAL LOW (ref >60)
GFR calc non Af Amer: 25 mL/min — ABNORMAL LOW (ref >60)
Glucose, Bld: 153 mg/dL — ABNORMAL HIGH (ref 70–99)
Potassium: 5.2 mmol/L — ABNORMAL HIGH (ref 3.5–5.1)
Sodium: 137 mmol/L (ref 135–145)

## 2020-01-08 NOTE — Progress Notes (Addendum)
  Progress Note    01/08/2020 11:51 AM 4 Days Post-Op  Subjective:  No complaints.Eager to go home because he states he has not slept well here   Vitals:   01/08/20 0404 01/08/20 0904  BP: 132/72 (!) 153/76  Pulse: 74 65  Resp: 17 20  Temp: 97.6 F (36.4 C)   SpO2: 97% 97%   Physical Exam: General:well appearing, well nourished, sleeping when I entered room Lungs:  Non labored Extremities:  2+ femoral pulses. Left femoral access site with ecchymosis. No hematoma. C/d/i dressing removed. Right lower extremity well perfused and warm. Right foot still slightly edematous. Minimal erythema.No drainage from right foot.Motor and sensory intact. Doppler PT/Pero/DP signals right foot Abdomen:  Distended, soft Neurologic: Alert and oriented  CBC    Component Value Date/Time   WBC 7.2 01/05/2020 0257   RBC 3.60 (L) 01/05/2020 0257   HGB 10.4 (L) 01/05/2020 0257   HGB 12.9 (L) 06/15/2017 1127   HCT 34.3 (L) 01/05/2020 0257   HCT 41.2 06/15/2017 1127   PLT 159 01/05/2020 0257   PLT 87 (LL) 06/15/2017 1127   MCV 95.3 01/05/2020 0257   MCV 95 06/15/2017 1127   MCH 28.9 01/05/2020 0257   MCHC 30.3 01/05/2020 0257   RDW 14.0 01/05/2020 0257   RDW 14.6 06/15/2017 1127   LYMPHSABS 0.6 (L) 10/18/2017 0702   MONOABS 0.5 10/18/2017 0702   EOSABS 0.0 10/18/2017 0702   BASOSABS 0.0 10/18/2017 0702    BMET    Component Value Date/Time   NA 137 01/08/2020 0552   NA 139 06/15/2017 1127   K 5.2 (H) 01/08/2020 0552   CL 105 01/08/2020 0552   CO2 24 01/08/2020 0552   GLUCOSE 153 (H) 01/08/2020 0552   BUN 40 (H) 01/08/2020 0552   BUN 21 06/15/2017 1127   CREATININE 2.33 (H) 01/08/2020 0552   CREATININE 1.29 12/30/2014 1352   CALCIUM 8.5 (L) 01/08/2020 0552   GFRNONAA 25 (L) 01/08/2020 0552   GFRAA 29 (L) 01/08/2020 0552    INR    Component Value Date/Time   INR 1.1 01/03/2020 1500     Intake/Output Summary (Last 24 hours) at 01/08/2020 1151 Last data filed at 01/08/2020  0400 Gross per 24 hour  Intake 460 ml  Output 350 ml  Net 110 ml     Assessment/Plan:  86 y.o. male is s/p abdominal aortogram with right lower extremity runoff 4 Days Post-Op for severe tibial occlusive disease with cellulitis and osteomyelitis of 5th metatarsal head and shaft. He is on Vanc and Zosyn. Had PICC line placed yesterday. He will go home on 6 weeks of IV antibiotics. Orders placed for this. Continue IV fluids and will follow am labs tomorrow. Anticipate discharge tomorrow if improvement of Scr   DVT prophylaxis: sq Heparin   Karoline Caldwell, PA-C [facility] [phone] 01/08/2020 11:51 AM   Agree with above no significant drainage at this point still some mild erythema on the dorsum of the foot.  Elevated creatinine again probably ATN secondary to contrast.  We will get 1 more day of IV hydration.  If patient's creatinine is stable or improved tomorrow will DC home  [name], MD [facility] [place] Office: [phone]

## 2020-01-08 NOTE — Progress Notes (Signed)
MOBILITY TEAM - Progress Note   01/08/20 1553  Mobility  Activity Ambulated in hall  Level of Assistance Modified independent, requires aide device or extra time  Assistive Device Front wheel walker  Distance Ambulated (ft) 220 ft  Mobility Response Tolerated well  Mobility performed by Mobility specialist  Bed Position Chair   Pre-activity: HR 76, SpO2 93% on RA Dost-activity: HR 84, SpO2 93% on RA  Patient motivated to participate. DOE 2-3/4 with ambulation. Hopeful for d/c home tomorrow.   Mabeline Caras, PT, DPT Mobility Team Pager [phone]

## 2020-01-08 NOTE — TOC Initial Note (Signed)
Transition of Care (TOC) - Initial/Assessment Note  Marvetta Gibbons RN, BSN Transitions of Care Unit 4E- RN Case Manager [phone]   Patient Details  Name: Scott [name] MRN: [id number] Date of Birth: 1932-12-02  Transition of Care [facility]) CM/SW Contact:    Dawayne Patricia, RN Phone Number: 01/08/2020, 4:54 PM  Clinical Narrative:                 Noted pt will need 6 wks of IV abx- PICC line has been placed- OPAT orders placed per pharmacy- Pt will need HHRN order placed with F2F CM spoke with pt at bedside- discussed transition of care needs- home IV abx and potential support people- pt has brother in town and a neighbor he says that may can help him. Address, phone #s and PCP all confirmed in epic. Pt states he will have transportation home. List provided to pt for [facility] choice Per CMS guidelines from medicare.gov website with star ratings (copy placed in shadow chart)- per pt he has never used [place] before and does not have a preference on agencies- pt gives permission for CM to find and speak with [facility] agency on his behalf to secure needed services- pt is agreeable to use [facility] for Home IV abx needs. Call made to [facility] with [facility] for home IV abx need referral- referral accepted for IV abx infusion pt will f/u for bedside education with pt prior to discharge and coordinate with [facility] agency. Call also made to [facility] with Alvis Lemmings for [facility] referral needs for home IV abx- pending order- referral has been accepted and Alvis Lemmings will work along with [facility] for home IV abx needs.    Expected Discharge Plan: [place] Barriers to Discharge: Continued Medical Work up   Patient Goals and CMS Choice Patient states their goals for this hospitalization and ongoing recovery are:: to get better CMS Medicare.gov Compare Post Acute Care list provided to:: Patient Choice offered to / list presented to : Patient  Expected Discharge Plan and  Services Expected Discharge Plan: [place]   Discharge Planning Services: CM Consult Post Acute Care Choice: [place] arrangements for the past 2 months: Single Family Home                 DME Arranged: N/A DME Agency: NA       HH Arranged: RN, IV Antibiotics HH Agency: [facility], [facility] Date [place]: 01/08/20 Time [place]: 12 Representative spoke with at [place]: Tommi Rumps and Jeannene Patella  Prior Living Arrangements/Services Living arrangements for the past 2 months: [place] Lives with:: Self Patient language and need for interpreter reviewed:: Yes Do you feel safe going back to the place where you live?: Yes      Need for Family Participation in Patient Care: Yes (Comment)     Criminal Activity/Legal Involvement Pertinent to Current Situation/Hospitalization: No - Comment as needed  Activities of Daily Living Home Assistive Devices/Equipment: Eyeglasses ADL Screening (condition at time of admission) Patient's cognitive ability adequate to safely complete daily activities?: Yes Is the patient deaf or have difficulty hearing?: No Does the patient have difficulty seeing, even when wearing glasses/contacts?: No Does the patient have difficulty concentrating, remembering, or making decisions?: No Patient able to express need for assistance with ADLs?: Yes Does the patient have difficulty dressing or bathing?: No Independently performs ADLs?: Yes (appropriate for developmental age) Does the patient have  difficulty walking or climbing stairs?: Yes Weakness of Legs: Both Weakness of Arms/Hands: None  Permission Sought/Granted Permission sought to share information with : Investment banker, corporate granted to share info w AGENCY: HH agencies        Emotional Assessment Appearance:: Appears stated age Attitude/Demeanor/Rapport: Engaged Affect (typically observed): Appropriate,  Pleasant Orientation: : Oriented to  Time, Oriented to Situation, Oriented to Place, Oriented to Self Alcohol / Substance Use: Not Applicable Psych Involvement: No (comment)  Admission diagnosis:  PAD (peripheral artery disease) ([place]) [I73.9] Patient Active Problem List   Diagnosis Date Noted  . PAD (peripheral artery disease) ([place]) 01/03/2020  . Choledocholithiasis   . Pancreatitis 10/16/2017  . Midsternal chest pain 05/21/2017  . Nausea - without vomiting 01/01/2015  . Low grade fever 01/01/2015  . DOE (dyspnea on exertion) 01/01/2015  . Unstable angina ([place]) 05/12/2013  . Chronic combined systolic and diastolic heart failure ([place]) 05/12/2013  . CKD (chronic kidney disease) stage 3, GFR 30-59 ml/min (HCC) 09/01/2012  . OSA (obstructive sleep apnea) 07/04/2012  . Dyspnea 11/05/2011  . Diabetes mellitus, type II, insulin dependent ([place]) 04/07/2011  . Hypertension   . Ischemic cardiomyopathy   . Myocardial infarction ([place])   . Dyslipidemia   . Arteriosclerotic coronary artery disease    PCP:  Reubin Milan, MD Pharmacy:   [facility] [place], [place] [address] [place] [address] Phone: [phone] Fax: [place], [place] [address] Phone: [phone] Fax: [phone]     Social Determinants of Health (SDOH) Interventions    Readmission Risk Interventions No flowsheet data found.

## 2020-01-08 NOTE — Progress Notes (Addendum)
PHARMACY CONSULT NOTE FOR:  OUTPATIENT  PARENTERAL ANTIBIOTIC THERAPY (OPAT)  Indication: right foot cellulitis/ osteomyelitis Regimen: vancomycin 750mg  IV q24hr and cefepime 2gm IV q24h End date: 02/08/20  IV antibiotic discharge orders are pended. To discharging provider:  please sign these orders via discharge navigator,  Select New Orders & click on the button choice - Manage This Unsigned Work.     Thank you for allowing pharmacy to be a part of this patient's care.  Hildred Laser, PharmD Clinical Pharmacist **Pharmacist phone directory can now be found on [email or web address] ([email or web address]).  Listed under [place].

## 2020-01-08 NOTE — Progress Notes (Addendum)
Report received from [place], [place]

## 2020-01-08 NOTE — Progress Notes (Addendum)
Pharmacy Antibiotic Note  Scott [name] is a 86 y.o. male ws admitted on 01/03/2020 with cellulitis and has early osteo of 5th metatarsal head and distal shaft.  Pharmacy has been consulted for vancomycin dosing. Plans are to treat until 02/08/20 -SCr= 2.33 (CrCl ~ 30; baseline ~ 1.7-1.8)  -estimated AUC= 591; SCr with trend down   Plan: -Continue vancomycin 750 mg IV Q 24 hrs (anticipate continued improvement of SCr) -Continue zosyn 3.375gm IV q8h -Closely monitor vancomycin levels and BMET after disharge   Height: 6' (182.9 cm) Weight: 215 lb 13.3 oz (97.9 kg) IBW/kg (Calculated) : 77.6  Temp (24hrs), Avg:97.6 F (36.4 C), Min:97.3 F (36.3 C), Max:98 F (36.7 C)  Recent Labs  Lab 01/03/20 1500 01/03/20 1500 01/04/20 0918 01/05/20 0257 01/06/20 1623 01/07/20 0240 01/07/20 1407 01/07/20 1531 01/08/20 0552  WBC 8.4  --   --  7.2  --   --   --   --   --   CREATININE 1.73*   < > 1.48* 1.76*  --  2.86*  --  2.51* 2.33*  VANCOTROUGH  --   --   --   --   --   --  13*  --   --   VANCOPEAK  --   --   --   --  40  --   --   --   --    < > = values in this interval not displayed.    Estimated Creatinine Clearance: 28.6 mL/min (A) (by C-G formula based on SCr of 2.33 mg/dL (H)).    No Known Allergies   Hildred Laser, PharmD Clinical Pharmacist **Pharmacist phone directory can now be found on [email or web address] ([email or web address]).  Listed under [place].

## 2020-01-08 NOTE — Plan of Care (Signed)
  Problem: Skin Integrity: Goal: Risk for impaired skin integrity will decrease Outcome: Progressing   Problem: Clinical Measurements: Goal: Ability to avoid or minimize complications of infection will improve Outcome: Progressing

## 2020-01-09 ENCOUNTER — Inpatient Hospital Stay (HOSPITAL_COMMUNITY): Payer: Medicare HMO

## 2020-01-09 LAB — GLUCOSE, CAPILLARY
Glucose-Capillary: 101 mg/dL — ABNORMAL HIGH (ref 70–99)
Glucose-Capillary: 113 mg/dL — ABNORMAL HIGH (ref 70–99)
Glucose-Capillary: 188 mg/dL — ABNORMAL HIGH (ref 70–99)
Glucose-Capillary: 248 mg/dL — ABNORMAL HIGH (ref 70–99)
Glucose-Capillary: 45 mg/dL — ABNORMAL LOW (ref 70–99)

## 2020-01-09 LAB — BASIC METABOLIC PANEL
Anion gap: 9 (ref 5–15)
BUN: 36 mg/dL — ABNORMAL HIGH (ref 8–23)
CO2: 22 mmol/L (ref 22–32)
Calcium: 8.4 mg/dL — ABNORMAL LOW (ref 8.9–10.3)
Chloride: 105 mmol/L (ref 98–111)
Creatinine, Ser: 2.13 mg/dL — ABNORMAL HIGH (ref 0.61–1.24)
GFR calc Af Amer: 32 mL/min — ABNORMAL LOW (ref >60)
GFR calc non Af Amer: 27 mL/min — ABNORMAL LOW (ref >60)
Glucose, Bld: 167 mg/dL — ABNORMAL HIGH (ref 70–99)
Potassium: 5.4 mmol/L — ABNORMAL HIGH (ref 3.5–5.1)
Sodium: 136 mmol/L (ref 135–145)

## 2020-01-09 LAB — CBC
HCT: 32.9 % — ABNORMAL LOW (ref 39.0–52.0)
Hemoglobin: 9.9 g/dL — ABNORMAL LOW (ref 13.0–17.0)
MCH: 28.8 pg (ref 26.0–34.0)
MCHC: 30.1 g/dL (ref 30.0–36.0)
MCV: 95.6 fL (ref 80.0–100.0)
Platelets: 178 10*3/uL (ref 150–400)
RBC: 3.44 MIL/uL — ABNORMAL LOW (ref 4.22–5.81)
RDW: 14.5 % (ref 11.5–15.5)
WBC: 7.3 10*3/uL (ref 4.0–10.5)
nRBC: 0 % (ref 0.0–0.2)

## 2020-01-09 MED ORDER — FLEET ENEMA 7-19 GM/118ML RE ENEM
1.0000 | ENEMA | Freq: Once | RECTAL | Status: AC
  Administered 2020-01-09: 1 via RECTAL
  Filled 2020-01-09: qty 1

## 2020-01-09 MED ORDER — HEPARIN SOD (PORK) LOCK FLUSH 100 UNIT/ML IV SOLN
250.0000 [IU] | INTRAVENOUS | Status: AC | PRN
  Administered 2020-01-09: 250 [IU]
  Filled 2020-01-09: qty 2.5

## 2020-01-09 MED ORDER — CEFEPIME IV (FOR PTA / DISCHARGE USE ONLY): 2.0000 g | INTRAVENOUS | 0 refills | Status: DC

## 2020-01-09 MED ORDER — METRONIDAZOLE 500 MG PO TABS: 500.0000 mg | ORAL_TABLET | Freq: Three times a day (TID) | ORAL | 0 refills | Status: AC

## 2020-01-09 MED ORDER — VANCOMYCIN IV (FOR PTA / DISCHARGE USE ONLY): 750.0000 mg | INTRAVENOUS | 0 refills | Status: DC

## 2020-01-09 MED ORDER — METRONIDAZOLE 500 MG PO TABS
500.0000 mg | ORAL_TABLET | Freq: Three times a day (TID) | ORAL | Status: DC
  Administered 2020-01-09: 500 mg via ORAL
  Filled 2020-01-09: qty 1

## 2020-01-09 MED ORDER — SODIUM CHLORIDE 0.9 % IV SOLN
2.0000 g | INTRAVENOUS | Status: DC
  Administered 2020-01-09: 2 g via INTRAVENOUS
  Filled 2020-01-09: qty 2

## 2020-01-09 MED FILL — metroNIDAZOLE 500 MG TABS: 500 | 30 days supply | Qty: 90 | Fill #0

## 2020-01-09 NOTE — Progress Notes (Signed)
Pharmacy Antibiotic Note  Scott [name] is a 86 y.o. male ws admitted on 01/03/2020 with cellulitis and has early osteo of 5th metatarsal head and distal shaft.  Pharmacy has been consulted for vancomycin dosing. Plans are to treat until 02/08/20 -SCr= 2.13 (CrCl ~ 30; baseline ~ 1.7-1.8) -estimated AUC= 556; SCr with trend down  Plans are to change to cefepime IV and flagyl po.     Plan: -Continue vancomycin 750 mg IV Q 24 hrs (anticipate continued improvement of SCr) -Cefepime 2gm IV q24h -Flagyl po q8h  -last day of antibiotics will be on 02/08/20) -Closely monitor vancomycin levels and BMET after disharge   Height: 6' (182.9 cm) Weight: 215 lb 13.3 oz (97.9 kg) IBW/kg (Calculated) : 77.6  Temp (24hrs), Avg:97.6 F (36.4 C), Min:97.5 F (36.4 C), Max:97.6 F (36.4 C)  Recent Labs  Lab 01/03/20 1500 01/04/20 0918 01/05/20 0257 01/06/20 1623 01/07/20 0240 01/07/20 1407 01/07/20 1531 01/08/20 0552 01/09/20 0742  WBC 8.4  --  7.2  --   --   --   --   --  7.3  CREATININE 1.73*   < > 1.76*  --  2.86*  --  2.51* 2.33* 2.13*  VANCOTROUGH  --   --   --   --   --  13*  --   --   --   VANCOPEAK  --   --   --  40  --   --   --   --   --    < > = values in this interval not displayed.    Estimated Creatinine Clearance: 30.7 mL/min (A) (by C-G formula based on SCr of 2.13 mg/dL (H)).    No Known Allergies   Hildred Laser, PharmD Clinical Pharmacist **Pharmacist phone directory can now be found on [email or web address] ([email or web address]).  Listed under [place].

## 2020-01-09 NOTE — Progress Notes (Signed)
Repeat CBG 101

## 2020-01-09 NOTE — Progress Notes (Addendum)
Progress Note    01/09/2020 7:27 AM 5 Days Post-angio  Subjective: "My stomach is tight".  Not eating much.  Had a small loose bowel movement last night.  He denies nausea or vomiting.   Vitals:   01/09/20 0542 01/09/20 0600  BP: (!) 146/72   Pulse: 60   Resp: (!) 24   Temp:  (!) 97.5 F (36.4 C)  SpO2: 95%     Physical Exam: Cardiac:  RRR Lungs: Clear to auscultation bilaterally Extremities: Both feet are warm.  Trace pitting edema Abdomen: Moderately distended.  Tympanic.  Bowel sounds consistent with gurgles and rushes.  Nontender  CBC    Component Value Date/Time   WBC 7.2 01/05/2020 0257   RBC 3.60 (L) 01/05/2020 0257   HGB 10.4 (L) 01/05/2020 0257   HGB 12.9 (L) 06/15/2017 1127   HCT 34.3 (L) 01/05/2020 0257   HCT 41.2 06/15/2017 1127   PLT 159 01/05/2020 0257   PLT 87 (LL) 06/15/2017 1127   MCV 95.3 01/05/2020 0257   MCV 95 06/15/2017 1127   MCH 28.9 01/05/2020 0257   MCHC 30.3 01/05/2020 0257   RDW 14.0 01/05/2020 0257   RDW 14.6 06/15/2017 1127   LYMPHSABS 0.6 (L) 10/18/2017 0702   MONOABS 0.5 10/18/2017 0702   EOSABS 0.0 10/18/2017 0702   BASOSABS 0.0 10/18/2017 0702    BMET    Component Value Date/Time   NA 137 01/08/2020 0552   NA 139 06/15/2017 1127   K 5.2 (H) 01/08/2020 0552   CL 105 01/08/2020 0552   CO2 24 01/08/2020 0552   GLUCOSE 153 (H) 01/08/2020 0552   BUN 40 (H) 01/08/2020 0552   BUN 21 06/15/2017 1127   CREATININE 2.33 (H) 01/08/2020 0552   CREATININE 1.29 12/30/2014 1352   CALCIUM 8.5 (L) 01/08/2020 0552   GFRNONAA 25 (L) 01/08/2020 0552   GFRAA 29 (L) 01/08/2020 0552     Intake/Output Summary (Last 24 hours) at 01/09/2020 0727 Last data filed at 01/09/2020 0721 Gross per 24 hour  Intake 1780 ml  Output 200 ml  Net 1580 ml    HOSPITAL MEDICATIONS Scheduled Meds: . allopurinol  100 mg Oral BID  . aspirin EC  81 mg Oral QHS  . atorvastatin  80 mg Oral Daily  . carvedilol  12.5 mg Oral BID WC  . Chlorhexidine  Gluconate Cloth  6 each Topical Daily  . docusate sodium  100 mg Oral BID  . gabapentin  300 mg Oral QHS  . heparin  5,000 Units Subcutaneous Q8H  . insulin aspart  0-9 Units Subcutaneous TID WC  . insulin aspart protamine- aspart  35 Units Subcutaneous BID AC  . latanoprost  1 drop Both Eyes QHS  . pantoprazole  40 mg Oral Daily  . potassium chloride  20-40 mEq Oral Once  . sodium chloride flush  10-40 mL Intracatheter Q12H  . sodium chloride flush  3 mL Intravenous Q12H  . tamsulosin  0.4 mg Oral QPM   Continuous Infusions: . sodium chloride 100 mL/hr at 01/09/20 0548  . sodium chloride    . piperacillin-tazobactam (ZOSYN)  IV 3.375 g (01/09/20 0521)  . vancomycin 750 mg (01/08/20 1554)   PRN Meds:.sodium chloride, acetaminophen, alum & mag hydroxide-simeth, bisacodyl, colchicine, fluticasone, hydrALAZINE, HYDROmorphone (DILAUDID) injection, labetalol, labetalol, loratadine, metoprolol tartrate, morphine injection, nitroGLYCERIN, ondansetron (ZOFRAN) IV, oxyCODONE, senna-docusate, sodium chloride flush, sodium chloride flush  Assessment:  86 y.o. male is s/p: Abdominal aortogram with right lower extremity runoff.  Ongoing treatment  for osteomyelitis right foot.  This morning, his abdomen is distended and he has poor p.o. intake.  Only 200 cc urine output charted.   5 Days Post-angio  Plan: -We will get flat and upright plain films of the abdomen to assess distention. Bladder scan to assess for urinary obstruction Drop in glucose this am. Poor appetite. Will dc 70/30 and continue SSI Update labs -DVT prophylaxis:  Heparin [place]   Risa Grill, PA-C [facility] [phone] 01/09/2020  7:27 AM   Agree with above if creatinine continues to rise will get Renal consult Give enema today to help with constipation Right foot looks about the same continue antibiotics  [name], MD [facility] [place] Office: [phone]

## 2020-01-09 NOTE — TOC Transition Note (Signed)
Transition of Care [facility]) - CM/SW Discharge Note Marvetta Gibbons RN, BSN Transitions of Care Unit 4E- RN Case Manager [phone]   Patient Details  Name: Scott [name] MRN: [id number] Date of Birth: 1932-12-09  Transition of Care [facility]) CM/SW Contact:  Dawayne Patricia, RN Phone Number: 01/09/2020, 3:15 PM   Clinical Narrative:    Pt stable for transition home today, [place] IV abx have been coordinated with [facility] and [facility]- have spoken with both [facility] and [facility] respectively. Orders placed for OPAT (new abx selection) and HHRN. Jeannene Patella to complete bedside education with pt prior to discharge- pt will receive today's doses of abx here prior to leaving- bedside RN aware.    Final next level of care: [place] Barriers to Discharge: Barriers Resolved   Patient Goals and CMS Choice Patient states their goals for this hospitalization and ongoing recovery are:: to get better CMS Medicare.gov Compare Post Acute Care list provided to:: Patient Choice offered to / list presented to : Patient  Discharge Placement               Home with [facility]        Discharge Plan and Services   Discharge Planning Services: CM Consult Post Acute Care Choice: Home Health          DME Arranged: N/A DME Agency: NA       HH Arranged: RN, IV Antibiotics HH Agency: [facility], [facility] Date [place]: 01/08/20 Time [place]: 2458 Representative spoke with at [place]: Tommi Rumps and [facility]  Social Determinants of Health ([place]) Interventions     Readmission Risk Interventions Readmission Risk Prevention Plan 01/09/2020  Transportation Screening Complete  PCP or Specialist Appt within 5-7 Days Complete  Home Care Screening Complete  Medication Review (RN CM) Complete  Some recent data might be hidden

## 2020-01-09 NOTE — Progress Notes (Signed)
Inpatient Diabetes Program Recommendations  AACE/ADA: New Consensus Statement on Inpatient Glycemic Control  Target Ranges:  Prepandial:   less than 140 mg/dL      Peak postprandial:   less than 180 mg/dL (1-2 hours)      Critically ill patients:  140 - 180 mg/dL   Results for ELIAM, SNAPP (MRN [id number]) as of 01/09/2020 11:22  Ref. Range 01/09/2020 00:21 01/09/2020 05:27 01/09/2020 05:59  Glucose-Capillary Latest Ref Range: 70 - 99 mg/dL 113 (H) 45 (L) 101 (H)   Results for ILIJA, MAXIM (MRN [id number]) as of 01/09/2020 11:22  Ref. Range 01/08/2020 05:58 01/08/2020 12:34 01/08/2020 16:14 01/08/2020 20:09  Glucose-Capillary Latest Ref Range: 70 - 99 mg/dL 143 (H)     Novolog 1 units 183 (H)     Novolog 2 units 171 (H)  70/30 35 units  Novolog 2 units 136 (H)   Review of Glycemic Control  Diabetes history: DM2 Outpatient Diabetes medications: 70/30 35 units BID Current orders for Inpatient glycemic control:Novolog 0-9 units TID with meals  Inpatient Diabetes Program Recommendations:   Insulin-Basal: Once glucose becomes consistently greater than 180 mg/dl, please consider ordering Lantus 10 units Q24H.  Insulin-Correction: Please consider ordering Novolog 0-5 units QHS for bedtime correction.  Thanks, Barnie Alderman, RN, MSN, CDE Diabetes Coordinator Inpatient Diabetes Program [phone] (Team Pager from 8am to 5pm)

## 2020-01-09 NOTE — Discharge Instructions (Signed)
Osteomyelitis, Adult  Bone infections (osteomyelitis) occur when bacteria or other germs get inside a bone. This can happen if you have an infection in another part of your body that spreads through your blood. Germs from your skin or from outside of your body can also cause this type of infection if you have a wound or a broken bone (fracture) that breaks the skin. Bone infections need to be treated quickly to prevent bone damage and to prevent the infection from spreading to other areas of your body. What are the causes? Most bone infections are caused by bacteria. They can also be caused by other germs, such as viruses and funguses. What increases the risk? You are more likely to develop this condition if you:  Recently had surgery, especially bone or joint surgery.  Have a long-term (chronic) disease, such as: ? Diabetes. ? HIV (human immunodeficiency virus). ? Rheumatoid arthritis. ? Sickle cell anemia. ? Kidney disease that requires dialysis.  Are aged 60 years or older.  Have a condition or take medicines that block or weaken your body's defense system (immune system).  Have a condition that reduces your blood flow.  Have an artificial joint.  Have had a joint or bone repaired with plates or screws (surgical hardware).  Use IV drugs.  Have a central line for IV access.  Have had trauma, such as stepping on a nail or a broken bone that came through the skin. What are the signs or symptoms? Symptoms vary depending on the type and location of your infection. Common symptoms of bone infections include:  Fever and chills.  Skin redness and warmth.  Swelling.  Pain and stiffness.  Drainage of fluid or pus near the infection. How is this diagnosed? This condition may be diagnosed based on:  Your symptoms and medical history.  A physical exam.  Tests, such as: ? A sample of tissue, fluid, or blood taken to be examined under a microscope. ? Pus or discharge swabbed  from a wound for testing to identify germs and to determine what type of medicine will kill them (culture and sensitivity). ? Blood tests.  Imaging studies. These may include: ? X-rays. ? MRI. ? CT scan. ? Bone scan. ? Ultrasound. How is this treated? Treatment for this condition depends on the cause and type of infection. Antibiotic medicines are usually the first treatment for a bone infection. This may be done in a hospital at first. You may have to continue IV antibiotics at home or take antibiotics by mouth for several weeks after that. Other treatments may include surgery to remove:  Dead or dying tissue from a bone.  An infected artificial joint.  Infected plates or screws that were used to repair a broken bone. Follow these instructions at home: Medicines   Take over-the-counter and prescription medicines only as told by your health care provider.  Take your antibiotic medicine as told by your health care provider. Do not stop taking the antibiotic even if you start to feel better.  Follow instructions from your health care provider about how to take IV antibiotics at home. You may need to have a nurse come to your home to give you the IV antibiotics. General instructions   Ask your health care provider if you have any restrictions on your activities.  If directed, put ice on the affected area: ? Put ice in a plastic bag. ? Place a towel between your skin and the bag. ? Leave the ice on for 20   minutes, 2-3 times a day.  Wash your hands often with soap and water. If soap and water are not available, use hand sanitizer.  Do not use any products that contain nicotine or tobacco, such as cigarettes and e-cigarettes. These can delay bone healing. If you need help quitting, ask your health care provider.  Keep all follow-up visits as told by your health care provider. This is important. Contact a health care provider if:  You develop a fever or chills.  You have  redness, warmth, pain, or swelling that returns after treatment. Get help right away if:  You have rapid breathing or you have trouble breathing.  You have chest pain.  You cannot drink fluids or make urine.  The affected area swells, changes color, or turns blue.  You have numbness or severe pain in the affected area. Summary  Bone infections (osteomyelitis) occur when bacteria or other germs get inside a bone.  You may be more likely to get this type of infection if you have a condition, such as diabetes, that lowers your ability to fight infection or increases your chances of getting an infection.  Most bone infections are caused by bacteria. They can also be caused by other germs, such as viruses and funguses.  Treatment for this condition usually starts with taking antibiotics. Further treatment depends on the cause and type of infection. This information is not intended to replace advice given to you by your health care provider. Make sure you discuss any questions you have with your health care provider. Document Revised: 10/13/2017 Document Reviewed: 10/06/2017 Elsevier Patient Education  [place].   Peripheral Vascular Disease Peripheral vascular disease (PVD) is a disease of the blood vessels. A simple term for PVD is poor circulation. In most cases, PVD narrows the blood vessels that carry blood from your heart to the rest of your body. This can result in a decreased supply of blood to your arms, legs, and internal organs, like your stomach or kidneys. However, it most often affects a person's lower legs and feet. There are two types of PVD.  Organic PVD. This is the more common type. It is caused by damage to the structure of blood vessels.  Functional PVD. This is caused by conditions that make blood vessels contract and tighten (spasm). Without treatment, PVD tends to get worse over time. PVD can also lead to acute limb ischemia. This is when an arm or leg  suddenly has trouble getting enough blood. This is a medical emergency. What are the causes?  Each type of PVD has many different causes. The most common cause of PVD is buildup of a fatty material (plaque) inside your arteries (atherosclerosis). Small amounts of plaque can break off from the walls of the blood vessels and become lodged in a smaller artery. This blocks blood flow and can cause acute limb ischemia. Other common causes of PVD include:  Blood clots that form inside of blood vessels.  Injuries to blood vessels.  Diseases that cause inflammation of blood vessels or cause blood vessel spasms.  Health behaviors and health history that increase your risk of developing PVD. What increases the risk? You are more likely to develop this condition if:  You have a family history of PVD.  You have certain medical conditions, including: ? High cholesterol. ? Diabetes. ? High blood pressure (hypertension). ? Coronary heart disease. ? Past problems with blood clots. ? Past injury, such as burns or a broken bone. These may have  damaged blood vessels in your limbs. ? Buerger disease. This is caused by inflamed blood vessels in your hands and feet. ? Some forms of arthritis. ? Rare birth defects that affect the arteries in your legs. ? Kidney disease.  You use tobacco or smoke.  You do not get enough exercise.  You are obese.  You are age 10 or older. What are the signs or symptoms? This condition may cause different symptoms. Your symptoms depend on what part of your body is not getting enough blood. Some common signs and symptoms include:  Cramps in your lower legs. This may be a symptom of poor leg circulation (claudication).  Pain and weakness in your legs. This happens while you are physically active but goes away when you rest (intermittent claudication).  Leg pain when at rest.  Leg numbness, tingling, or weakness.  Coldness in a leg or foot, especially when  compared with the other leg.  Skin or hair changes. These can include: ? Hair loss. ? Shiny skin. ? Pale or bluish skin. ? Thick toenails.  Inability to get or maintain an erection (erectile dysfunction).  Fatigue. People with PVD are more likely to develop ulcers and sores on their toes, feet, or legs. These may take longer than normal to heal. How is this diagnosed? This condition is diagnosed based on:  Your signs and symptoms.  A physical exam and your medical history.  Other tests to find out what is causing your PVD and to determine its severity. Tests may include: ? Blood pressure recordings from your arms and legs and measurements of the strength of your pulses (pulse volume recordings). ? Imaging studies using sound waves to take pictures of the blood flow through your blood vessels (Doppler ultrasound). ? Injecting a dye into your blood vessels before having imaging studies using:  X-rays (angiogram or arteriogram).  Computer-generated X-rays (CT angiogram).  A powerful electromagnetic field and a computer (magnetic resonance angiogram or MRA). How is this treated? Treatment for PVD depends on the cause of your condition and how severe your symptoms are. It also depends on your age. Underlying causes need to be treated and controlled. These include long-term (chronic) conditions, such as diabetes, high cholesterol, and high blood pressure. Treatment includes:  Lifestyle changes, such as: ? Quitting smoking. ? Exercising regularly. ? Following a low-fat, low-cholesterol diet.  Taking medicines, such as: ? Blood thinners to prevent blood clots. ? Medicines to improve blood flow. ? Medicines to improve your blood cholesterol levels.  Surgical procedures, such as: ? A procedure that uses an inflated balloon to open a blocked artery and improve blood flow (angioplasty). ? A procedure to put in a wire mesh tube to keep a blocked artery open (stent implant). ? Surgery  to reroute blood flow around a blocked artery (peripheral bypass surgery). ? Surgery to remove dead tissue from an infected wound on the affected limb. ? Amputation. This is surgical removal of the affected limb. It may be necessary in cases of acute limb ischemia where there has been no improvement through medical or surgical treatments. Follow these instructions at home: Lifestyle  Do not use any products that contain nicotine or tobacco, such as cigarettes and e-cigarettes. If you need help quitting, ask your health care provider.  Lose weight if you are overweight, and maintain a healthy weight as discussed by your health care provider.  Eat a diet that is low in fat and cholesterol. If you need help, ask your health care  provider.  Exercise regularly. Ask your health care provider to suggest some good activities for you. General instructions  Take over-the-counter and prescription medicines only as told by your health care provider.  Take good care of your feet: ? Wear comfortable shoes that fit well. ? Check your feet often for any cuts or sores.  Keep all follow-up visits as told by your health care provider. This is important. Contact a health care provider if:  You have cramps in your legs while walking.  You have leg pain when you are at rest.  You have coldness in a leg or foot.  Your skin changes.  You have erectile dysfunction.  You have cuts or sores on your feet that are not healing. Get help right away if:  Your arm or leg turns cold, numb, and blue.  Your arms or legs become red, warm, swollen, painful, or numb.  You have chest pain or trouble breathing.  You suddenly have weakness in your face, arm, or leg.  You become very confused or lose the ability to speak.  You suddenly have a very bad headache or lose your vision. Summary  Peripheral vascular disease (PVD) is a disease of the blood vessels.  In most cases, PVD narrows the blood vessels that  carry blood from your heart to the rest of your body.  PVD may cause different symptoms. Your symptoms depend on what part of your body is not getting enough blood.  Treatment for PVD depends on the cause of your condition and how severe your symptoms are. This information is not intended to replace advice given to you by your health care provider. Make sure you discuss any questions you have with your health care provider. Document Revised: 09/09/2017 Document Reviewed: 11/04/2016 Elsevier Patient Education  2020 [employer].

## 2020-01-09 NOTE — Progress Notes (Signed)
Patient request his sugar be checked since he received 70/30 Insulin earlier tonight. It was 113.

## 2020-01-09 NOTE — Progress Notes (Signed)
Awaken patient to give a.m. meds. Patient diaphoric. Check CBG 45 . Gave patient O.J. and Pretzels. Will reck CBG shortly

## 2020-01-10 ENCOUNTER — Telehealth: Payer: Self-pay | Admitting: Cardiology

## 2020-01-10 DIAGNOSIS — L97512 Non-pressure chronic ulcer of other part of right foot with fat layer exposed: Secondary | ICD-10-CM | POA: Diagnosis not present

## 2020-01-10 DIAGNOSIS — I130 Hypertensive heart and chronic kidney disease with heart failure and stage 1 through stage 4 chronic kidney disease, or unspecified chronic kidney disease: Secondary | ICD-10-CM | POA: Diagnosis not present

## 2020-01-10 DIAGNOSIS — Z452 Encounter for adjustment and management of vascular access device: Secondary | ICD-10-CM | POA: Diagnosis not present

## 2020-01-10 DIAGNOSIS — K861 Other chronic pancreatitis: Secondary | ICD-10-CM | POA: Diagnosis not present

## 2020-01-10 DIAGNOSIS — I5042 Chronic combined systolic (congestive) and diastolic (congestive) heart failure: Secondary | ICD-10-CM | POA: Diagnosis not present

## 2020-01-10 DIAGNOSIS — E1151 Type 2 diabetes mellitus with diabetic peripheral angiopathy without gangrene: Secondary | ICD-10-CM | POA: Diagnosis not present

## 2020-01-10 DIAGNOSIS — N183 Chronic kidney disease, stage 3 unspecified: Secondary | ICD-10-CM | POA: Diagnosis not present

## 2020-01-10 DIAGNOSIS — M86171 Other acute osteomyelitis, right ankle and foot: Secondary | ICD-10-CM | POA: Diagnosis not present

## 2020-01-10 DIAGNOSIS — L03115 Cellulitis of right lower limb: Secondary | ICD-10-CM | POA: Diagnosis not present

## 2020-01-10 DIAGNOSIS — E1122 Type 2 diabetes mellitus with diabetic chronic kidney disease: Secondary | ICD-10-CM | POA: Diagnosis not present

## 2020-01-10 DIAGNOSIS — K859 Acute pancreatitis without necrosis or infection, unspecified: Secondary | ICD-10-CM | POA: Diagnosis not present

## 2020-01-10 DIAGNOSIS — I10 Essential (primary) hypertension: Secondary | ICD-10-CM | POA: Diagnosis not present

## 2020-01-10 NOTE — Telephone Encounter (Signed)
Spoke to patient he stated he has had increased swelling from chest down into both lower legs and feet for the past 4 days.He is sob.Weight stable.Stated he was discharged from [facility] yesterday 3/31.Advised to double lasix dose for the next 3 days then return to normal dose.No extender appointments.Appointment scheduled with Dr.Jordan 4/8 at 3:00 pm.Advised to go to ED if needed.I will make Dr.Jordan aware.

## 2020-01-10 NOTE — Discharge Summary (Signed)
Discharge Summary  Patient ID: Scott [name] [id number] y.o. May 10, 1933  Admit date: 01/03/2020  Discharge date and time: 01/09/2020  6:18 PM   Admitting Physician: Elam Dutch, MD   Discharge Physician: Elam Dutch, MD  Admission Diagnoses: PAD (peripheral artery disease) [facility]) [I73.9]  Discharge Diagnoses: PAD (peripheral artery disease) ([place]) [I73.9], right foot cellulitis, right fifth metatarsal osteomyelitis  Admission Condition: fair  Discharged Condition: fair  Indication for Admission: Right lower extremity cellulitis, decreased right ankle-brachial index  Hospital Course: The patient is an 86 year old male who was evaluated in the office by Dr. Oneida Alar on January 03, 2020.  He was noted to have cellulitis and most likely osteomyelitis of the right fifth metatarsal head.  He had decreased ABI in the right leg of 0.55.  He was counseled regarding need for admission, IV antibiotics and arteriogram.  He was started on IV fluid hydration as well.  9 on March 26, he was taken to the [facility] lab where he underwent abdominal aortogram with right lower extremity runoff.  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  Decision was made not to intervene on the very short segment of the anterior tibial artery stenosis as this is his only runoff vessel.  MRI was performed of the right foot.  The area of ulceration overlying the plantar surface of the fifth metatarsal head showed a probable tiny sinus tract.  Findings were suggestive of early acute osteomyelitis involving the fifth metatarsal head and distal metatarsal shaft.  Plans were put in place to continue antibiotics for 4 to 6 weeks and a PICC line was placed.  His initial creatinine was noted to be 1.48 and this rose to a high of 2.86.  IV hydration was continued.  His creatinine trended downward.  Discharge it was 2.13.  His abdomen was noted to  be moderately distended and plain films of the abdomen ruled out obstruction or ileus.  He was having bowel movements.  No nausea or vomiting.  Pharmacist consultation regarding antibiotics reviewed.  The patient was ready for discharge home in satisfactory condition on January 09, 2020.  He will continue on IV cefepime and vancomycin.  Oral metronidazole to cover anaerobes.  Home health and follow-up labs arranged.   Consults: None  Treatments: antibiotics: vancomycin, Zosyn  Discharge Exam:  Vitals:   01/09/20 1325 01/09/20 1735  BP: (!) 150/86   Pulse: (!) 58 68  Resp: (!) 21   Temp: (!) 97.5 F (36.4 C)   SpO2: 95%    Cardiac: Rate and rhythm regular Lungs: Clear to auscultation bilaterally Extremities: Warm and well perfused with dopplerable pedal pulses. Abdomen: Mild distention with active bowel sounds. Neurologic: Alert and oriented x4   Disposition: Discharge disposition: 01-Home or Self Care       Patient Instructions:  Allergies as of 01/09/2020   No Known Allergies     Medication List    TAKE these medications   allopurinol 100 MG tablet Commonly known as: ZYLOPRIM Take 2 tablets (200 mg total) by mouth daily. What changed:   how much to take  when to take this   aspirin 81 MG EC tablet Take 81 mg by mouth at bedtime.   atorvastatin 80 MG tablet Commonly known as: LIPITOR Take 80 mg by mouth daily.   b complex vitamins tablet Take 1 tablet by mouth daily.   bismuth subsalicylate 299 ME/26ST suspension Commonly known  as: PEPTO BISMOL Take 30 mLs by mouth every 6 (six) hours as needed for indigestion.   carvedilol 12.5 MG tablet Commonly known as: COREG Take 1 tablet (12.5 mg total) by mouth 2 (two) times daily with a meal.   ceFEPime  IVPB Commonly known as: MAXIPIME Inject 2 g into the vein daily. Indication:  cellulitis/osteomyeltis Last Day of Therapy:  02/08/20 Labs - Once weekly:  CBC/D and BMP, Labs - Every other week:  ESR and CRP    clindamycin 300 MG capsule Commonly known as: CLEOCIN Take 1 capsule (300 mg total) by mouth 3 (three) times daily for 10 days.   colchicine 0.6 MG tablet TAKE 1 TABLET(0.6 MG) BY MOUTH THREE TIMES DAILY AS NEEDED FOR GOUT PAIN What changed: See the new instructions.   fluticasone 50 MCG/ACT nasal spray Commonly known as: FLONASE Place 2 sprays into both nostrils daily as needed for allergies or rhinitis.   furosemide 40 MG tablet Commonly known as: LASIX TAKE 1/2 TABLETS BY MOUTH 2 TIMES DAILY What changed: See the new instructions.   gabapentin 300 MG capsule Commonly known as: NEURONTIN Take 300 mg by mouth at bedtime.   insulin aspart protamine- aspart (70-30) 100 UNIT/ML injection Commonly known as: NOVOLOG MIX 70/30 Inject 35 Units into the skin 2 (two) times daily before a meal.   isosorbide mononitrate 30 MG 24 hr tablet Commonly known as: Imdur Take 1 tablet (30 mg total) by mouth daily.   latanoprost 0.005 % ophthalmic solution Commonly known as: XALATAN Place 1 drop into both eyes at bedtime.   loratadine 10 MG tablet Commonly known as: CLARITIN Take 10 mg by mouth daily as needed (for seasonal allergies).   losartan 100 MG tablet Commonly known as: COZAAR Take 100 mg by mouth daily.   metroNIDAZOLE 500 MG tablet Commonly known as: FLAGYL Take 1 tablet (500 mg total) by mouth every 8 (eight) hours.   multivitamin tablet Take 1 tablet by mouth daily.   nitroGLYCERIN 0.4 MG SL tablet Commonly known as: NITROSTAT Place 1 tablet (0.4 mg total) under the tongue every 5 (five) minutes as needed for chest pain (up to 3 doses).   pantoprazole 40 MG tablet Commonly known as: PROTONIX Take 1 tablet (40 mg total) by mouth 2 (two) times daily. What changed: when to take this   PRESCRIPTION MEDICATION CPAP: At bedtime   tamsulosin 0.4 MG Caps capsule Commonly known as: FLOMAX Take 0.4 mg by mouth every evening.   vancomycin  IVPB Inject 750 mg into  the vein daily. Indication:  Cellultitis/osteomyelitis  Last Day of Therapy:  02/08/20 Labs - Sunday/Monday:  CBC/D, BMP, and vancomycin trough. Labs - Thursday:  BMP and vancomycin trough Labs - Every other week:  ESR and CRP   Vitamin D-3 25 MCG (1000 UT) Caps Take 1,000 Units by mouth every other day.   Zinc 50 MG Tabs            Home Infusion Instuctions  (From admission, onward)         Start     Ordered   01/09/20 0000  Home infusion instructions    Question:  Instructions  Answer:  Flushing of vascular access device: 0.9% NaCl pre/post medication administration and prn patency; Heparin 100 u/ml, 32m for implanted ports and Heparin 10u/ml, 531mfor all other central venous catheters.   01/09/20 1506   01/09/20 0000  Home infusion instructions    Question:  Instructions  Answer:  Flushing of vascular  access device: 0.9% NaCl pre/post medication administration and prn patency; Heparin 100 u/ml, 75m for implanted ports and Heparin 10u/ml, 566mfor all other central venous catheters.   01/09/20 1506         Activity: activity as tolerated Diet: diabetic diet Wound Care: keep wound clean and dry  Follow-up with Dr. FiOneida Alarn 2 weeks.  Signed: SaBarbie BannerPA-C 01/10/2020 9:12 AM [facility] Office: [phone]

## 2020-01-10 NOTE — Telephone Encounter (Signed)
° °  Pt c/o Shortness Of Breath: STAT if SOB developed within the last 24 hours or pt is noticeably SOB on the phone  1. Are you currently SOB (can you hear that pt is SOB on the phone)? Yes  2. How long have you been experiencing SOB? 3 days ago,   3. Are you SOB when sitting or when up moving around? When sitting he feels pressure then if he moves around SOB gets worst  4. Are you currently experiencing any other symptoms? No other symptoms. He wanted to know if Dr. [place] would increase his fluid pill because he feels like there's a ton of fluid in his body   Please advise

## 2020-01-10 NOTE — Telephone Encounter (Signed)
Received a call from [place] with [facility].She is presently at patient's home to give him IV antibiotics.Stated he has gained 20 lbs in 1 week.Spoke to [place] he advised as previously  planned double lasix dose for the next 3 days then return to normal dose.Advised keep appointment 01/17/20 at 3:00 pm.Bmet will be done at visit.

## 2020-01-10 NOTE — Telephone Encounter (Signed)
Pt calling complaining of SOB. States he was discharged from the hospital yesterday afternoon.  He Reports that he started getting "full and bloated" on Monday and he let whoever was taking care of him at the hospital know on Tuesday. They asked if he had had a BM and he states that "only water came out". He reports that he was given a laxative and that this did not help. They did and XRAY of his stomach and it showed no blockage so he was given an enema and another laxative. Pt states he had a little BM with the enema. He still states that the "fullness" has not gone down.   Pt states that Dr.Jordan put him in the hospital years ago and had him taking lasix. He states he feels the same way now. Pt wants to know if he needs to be seen by Dr.Jordan or if he should double his lasix. Pt currently taking 40mg  twice a day (80mg  total) Pt denies chest pain or unusual wt gain. Wonders if the SOB is from the pressure. Notified pt that I would route this message to [place] for review. Pt verbalized understanding with no other questions at this time.

## 2020-01-10 NOTE — Telephone Encounter (Signed)
Agree. Admitted recently with cellulitis of right foot. Had AKI and was hydrated and lasix held. He does have history of CHF so diuresis appropriate. Will see 01/17/20  Collier [place]

## 2020-01-14 ENCOUNTER — Telehealth: Payer: Self-pay | Admitting: Plastic Surgery

## 2020-01-14 ENCOUNTER — Other Ambulatory Visit: Payer: Self-pay

## 2020-01-14 DIAGNOSIS — L03115 Cellulitis of right lower limb: Secondary | ICD-10-CM | POA: Diagnosis not present

## 2020-01-14 DIAGNOSIS — L97512 Non-pressure chronic ulcer of other part of right foot with fat layer exposed: Secondary | ICD-10-CM | POA: Diagnosis not present

## 2020-01-14 DIAGNOSIS — E1122 Type 2 diabetes mellitus with diabetic chronic kidney disease: Secondary | ICD-10-CM | POA: Diagnosis not present

## 2020-01-14 DIAGNOSIS — Z452 Encounter for adjustment and management of vascular access device: Secondary | ICD-10-CM | POA: Diagnosis not present

## 2020-01-14 DIAGNOSIS — E1151 Type 2 diabetes mellitus with diabetic peripheral angiopathy without gangrene: Secondary | ICD-10-CM | POA: Diagnosis not present

## 2020-01-14 DIAGNOSIS — M86171 Other acute osteomyelitis, right ankle and foot: Secondary | ICD-10-CM | POA: Diagnosis not present

## 2020-01-14 DIAGNOSIS — I5042 Chronic combined systolic (congestive) and diastolic (congestive) heart failure: Secondary | ICD-10-CM | POA: Diagnosis not present

## 2020-01-14 DIAGNOSIS — M869 Osteomyelitis, unspecified: Secondary | ICD-10-CM | POA: Diagnosis not present

## 2020-01-14 DIAGNOSIS — N183 Chronic kidney disease, stage 3 unspecified: Secondary | ICD-10-CM | POA: Diagnosis not present

## 2020-01-14 DIAGNOSIS — I130 Hypertensive heart and chronic kidney disease with heart failure and stage 1 through stage 4 chronic kidney disease, or unspecified chronic kidney disease: Secondary | ICD-10-CM | POA: Diagnosis not present

## 2020-01-14 NOTE — Patient Outreach (Addendum)
[name]) Care Management  01/14/2020  Scott [name] 1933/01/08 [id number]    [name] RED ON EMMI ALERT Day # 1 Date: 01/12/2020 Red Alert Reason: " Other questions/problems?Yes"   Outreach attempt #1 to patient. Spoke with patient who reports he is doing fairly well. He shares that he was having some issues with fluid retention  but is it resolving and that is why he responded to question that way. He voices HH RN was out to see him and contacted MD. Patient states he is "doubling up" on fluid pill and has gotten most of the weight off. He reports that he is adhering to low salt diet and fluid restrictions. He confirms that he has scale in the home and is monitoring wgt daily. He has cardiology follow up appt on Thurs. Patient endorses that he sees [place] [facility] for PCP and has not yet made follow up appt. Advised patient that they would get one more automated EMMI-GENERAL post discharge calls to assess how they are doing following recent hospitalization and will receive a call from a nurse if any of their responses were abnormal. Patient voiced understanding and was appreciative of f/u call.    Plan: RN CM will close case at this time.   [name], RN,BSN,CCM [facility] Telephonic Care Management Coordinator Direct Phone: [phone] Toll Free: [phone] Fax: [phone]

## 2020-01-14 NOTE — Telephone Encounter (Signed)
Called to check on patient.  He is feeling better.

## 2020-01-15 ENCOUNTER — Ambulatory Visit: Payer: Medicare Other | Admitting: Podiatry

## 2020-01-15 NOTE — Progress Notes (Signed)
Cardiology Office Note    Date:  01/17/2020   ID:  Scott [name], DOB [birth year], MRN [id number]  PCP:  Reubin Milan, MD  Cardiologist:  Dr. Martinique  Chief Complaint  Patient presents with  . Congestive Heart Failure  . Coronary Artery Disease    History of Present Illness:  Scott [name] is a 86 y.o. male seen for post hospital follow up with cellulitis and leg swelling. Last seen in September 2018. He has a  PMH of CAD s/p CABG (LIMA to LAD, SVG to PLA and SVG to PDA), HTN, HLD, chronic combined systolic and diastolic HF, IDDM, stage III CKD, gout, UTI, OSA on CPAP and dietary noncompliance. EF was 25-30% on echocardiogram in December 2013. Cardiac catheterization was on 09/04/2012 which showed diffuse 50% mid left main stenosis into 75% distal left main, diffuse 50% left circumflex stenosis, mid RCA was totally occluded, widely patent SVG to PDA, patent SVG to PLA with mild in-stent restenosis in the proximal aspect of the graft, patent LIMA to LAD. Echocardiogram obtained on 01/10/2014 showed EF has improved to 35-40%, moderate LVH, mild MR, PA peak pressure 50 mmHg. He was  admitted with chest pain on 05/21/2017. Serial troponin was borderline. He eventually underwent Myoview on 05/22/2017 which showed a small area of questionable ischemia versus attenuation artifact but overall felt to be low risk. He was able to ambulate without recurrent symptom limitation. Therefore he was discharged on the same day on 30 mg Imdur, lisinopril was discontinued. He continued to have symptoms so underwent cardiac cath that showed occlusion of the RCA and LAD but all grafts were patent.   He was admitted in January 2019 with gallstone pancreatitis treated with ERCP.   He was admitted in March 2021 with Cellulitis of the right foot with osteomyelitis. Angiogram showed severe anterior tibial disease with occlusion of the peroneal and PT vessels. Treated medically. He did have AKI with creatinine up to 2.86 before  declining to 2.17 with IV hydration and holding diuretics. Post DC he noted significant weight gain and edema and lasix was resumed. His weight increased to 240 lbs. He took lasix 80 mg bid for 3 days and is now back on 40 mg bid. Breathing is much better but still has some SOB. Weight has come down but he feels like he still has 10 extra pound of fluid. He has a PICC line in place now and still on antibiotics.    Past Medical History:  Diagnosis Date  . Acute biliary pancreatitis 10/2017   with choledocholithiasis.  ERCP, stone extraction performed  . Arthritis   . CAD (coronary artery disease)    a. 1997 CABGx3 (VG->RPDA, VG->PLV, LIMA->LAD);  b. 07/2004 PCI VG->PLV (3.5x16 Taxus DES, 3.5x12 Taxus DES); c. 11/2009 PCI: VG->PLV 3.5x12 Promus DES), VG->RPDA (3.5x15 Promus DES); d. 08/2012 Cath: patent grafts; e. 05/2013 Lexiscan MV: no ischemia/infarction, EF 44%.  . Cancer [facility]) 2005   colon surgery done  . Cataract   . Chronic combined systolic and diastolic CHF (congestive heart failure) ([place]) 08/2012   a. EF 50-55% 11/2011; b. 08/2012 - EF 25-30%,  with mild RM, mod TR, mod RAE & LAE, moderate reduced RV systolic function and PASP of 77mHg. - > cath showed patent grafts & Mod Puml HTN wth elevated PCWP;  c. 01/2014 Echo: EF 35-40%, mod LVH, mod HK, mildly dil LA, mod dil RA, PASP 533mg.  . CKD (chronic kidney disease), stage III   . Dyslipidemia   .  GERD (gastroesophageal reflux disease)   . Gout   . History of home oxygen therapy    uses oxygen with cpap low level not sure how many liters  . Hyperkalemia   . Hypertension   . Insulin Dependent Diabetes mellitus    type 2  . Myocardial infarction ([place]) 1997  . Sleep apnea   . Thrombocytopenia ([place]) 2011   dates back to 2011  . UTI (lower urinary tract infection) 08/2012   E.coli    Past Surgical History:  Procedure Laterality Date  . ABDOMINAL AORTOGRAM W/LOWER EXTREMITY Right 01/04/2020   Abdominal aortogram with right lower  extremity runoff  . ABDOMINAL AORTOGRAM W/LOWER EXTREMITY Bilateral 01/04/2020   Procedure: ABDOMINAL AORTOGRAM W/LOWER EXTREMITY;  Surgeon: Elam Dutch, MD;  Location: [facility];  Service: Vascular;  Laterality: Bilateral;  . APPENDECTOMY    . BACK SURGERY     15 years ago lower  . CARDIAC CATHETERIZATION  2005   stenting of the vein graft to the posterior lateral per -- Dr. Martinique      . CARDIAC CATHETERIZATION  2011   showed right coronary, totally occluded LAD and severe stenosis in both saphenous vein graft to the posterior lateral and posterior descending  -- stenting of the proximal portion of the SVG to the posterior lateral branch in 2/11  . CHOLECYSTECTOMY    . CORONARY ARTERY BYPASS GRAFT  1997    (LIMA to LAD, SVG to PDA, SVG to PL)  . ERCP N/A 10/17/2017    Irene Shipper, MD;  [facility] ENDOSCOPY; choledocholithiasis, biliary pancreatitis.  s/p sphincterotomy and stone extraction.    . ESOPHAGOGASTRODUODENOSCOPY (EGD) WITH PROPOFOL N/A 09/21/2017   Procedure: ESOPHAGOGASTRODUODENOSCOPY (EGD) WITH PROPOFOL;  Surgeon: Doran Stabler, MD;  Location: [facility] ENDOSCOPY;  Service: Gastroenterology;  Laterality: N/A;  . EYE SURGERY     ioc for cataract  . HERNIA REPAIR    . LEFT AND RIGHT HEART CATHETERIZATION WITH CORONARY/GRAFT ANGIOGRAM  09/04/2012   Procedure: LEFT AND RIGHT HEART CATHETERIZATION WITH Beatrix Fetters;  Surgeon: Sherren Mocha, MD;  Location: [facility] CATH LAB;  patent grafts, mod 2ndary pulmonary HTN, elevated LV EDP & PCWP  . LEFT HEART CATH AND CORS/GRAFTS ANGIOGRAPHY N/A 06/17/2017   Procedure: LEFT HEART CATH AND CORS/GRAFTS ANGIOGRAPHY;  Surgeon: Martinique, Kaylor Simenson M, MD;  Location: [place];  Service: Cardiovascular;  Laterality: N/A;  . PARTIAL COLECTOMY     cancerous polyps  . stent to heart  last done 5 to 6 yrs ago   x 4    Current Medications: Outpatient Medications Prior to Visit  Medication Sig Dispense Refill  . aspirin 81 MG EC  tablet Take 81 mg by mouth at bedtime.     Marland Kitchen atorvastatin (LIPITOR) 80 MG tablet Take 80 mg by mouth daily.     Marland Kitchen b complex vitamins tablet Take 1 tablet by mouth daily.    Marland Kitchen bismuth subsalicylate (PEPTO BISMOL) 262 MG/15ML suspension Take 30 mLs by mouth every 6 (six) hours as needed for indigestion.    . carvedilol (COREG) 12.5 MG tablet Take 1 tablet (12.5 mg total) by mouth 2 (two) times daily with a meal. 60 tablet 6  . ceFEPime (MAXIPIME) IVPB Inject 2 g into the vein daily. Indication:  cellulitis/osteomyeltis Last Day of Therapy:  02/08/20 Labs - Once weekly:  CBC/D and BMP, Labs - Every other week:  ESR and CRP 40 Units 0  . Cholecalciferol (VITAMIN D-3) 25 MCG (  1000 UT) CAPS Take 1,000 Units by mouth every other day.    . fluticasone (FLONASE) 50 MCG/ACT nasal spray Place 2 sprays into both nostrils daily as needed for allergies or rhinitis.    . furosemide (LASIX) 40 MG tablet TAKE 1/2 TABLETS BY MOUTH 2 TIMES DAILY (Patient taking differently: Take 40 mg by mouth 2 (two) times daily. ) 90 tablet 0  . gabapentin (NEURONTIN) 300 MG capsule Take 300 mg by mouth at bedtime.     . insulin aspart protamine- aspart (NOVOLOG MIX 70/30) (70-30) 100 UNIT/ML injection Inject 35 Units into the skin 2 (two) times daily before a meal.     . latanoprost (XALATAN) 0.005 % ophthalmic solution Place 1 drop into both eyes at bedtime.    Marland Kitchen loratadine (CLARITIN) 10 MG tablet Take 10 mg by mouth daily as needed (for seasonal allergies).     . losartan (COZAAR) 100 MG tablet Take 100 mg by mouth daily.    . metroNIDAZOLE (FLAGYL) 500 MG tablet Take 1 tablet (500 mg total) by mouth every 8 (eight) hours. 90 tablet 0  . Multiple Vitamin (MULTIVITAMIN) tablet Take 1 tablet by mouth daily.      . nitroGLYCERIN (NITROSTAT) 0.4 MG SL tablet Place 1 tablet (0.4 mg total) under the tongue every 5 (five) minutes as needed for chest pain (up to 3 doses). 25 tablet 3  . pantoprazole (PROTONIX) 40 MG tablet Take 1  tablet (40 mg total) by mouth 2 (two) times daily. (Patient taking differently: Take 40 mg by mouth daily. ) 180 tablet 3  . PRESCRIPTION MEDICATION CPAP: At bedtime    . tamsulosin (FLOMAX) 0.4 MG CAPS capsule Take 0.4 mg by mouth every evening.     . vancomycin IVPB Inject 750 mg into the vein daily. Indication:  Cellultitis/osteomyelitis  Last Day of Therapy:  02/08/20 Labs - Sunday/Monday:  CBC/D, BMP, and vancomycin trough. Labs - Thursday:  BMP and vancomycin trough Labs - Every other week:  ESR and CRP 30 Units 0  . Zinc 50 MG TABS     . allopurinol (ZYLOPRIM) 100 MG tablet Take 2 tablets (200 mg total) by mouth daily. (Patient taking differently: Take 100 mg by mouth 2 (two) times daily. ) 60 tablet 1  . colchicine 0.6 MG tablet TAKE 1 TABLET(0.6 MG) BY MOUTH THREE TIMES DAILY AS NEEDED FOR GOUT PAIN (Patient taking differently: Take 0.6 mg by mouth 3 (three) times daily as needed (for flares). ) 60 tablet 0  . isosorbide mononitrate (IMDUR) 30 MG 24 hr tablet Take 1 tablet (30 mg total) by mouth daily. (Patient not taking: Reported on 12/30/2018) 30 tablet 6   No facility-administered medications prior to visit.     Allergies:   Patient has no known allergies.   Social History   Socioeconomic History  . Marital status: Single    Spouse name: Not on file  . Number of children: 0  . Years of education: Not on file  . Highest education level: Not on file  Occupational History  . Occupation: retired   ran a Office manager course  Tobacco Use  . Smoking status: Former Smoker    Packs/day: 1.50    Years: 35.00    Pack years: 52.50    Types: Cigarettes    Quit date: 10/12/1983    Years since quitting: 36.2  . Smokeless tobacco: Never Used  Substance and Sexual Activity  . Alcohol use: Yes    Alcohol/week: 0.0 standard drinks  Comment: 2-3 drinks 3-4x/wk.  . Drug use: No  . Sexual activity: Not Currently    Birth control/protection: None  Other Topics Concern  . Not on file  Social  History Narrative   Lives in [place] by himself.  Active but does not routinely exercise.   Social Determinants of Health   Financial Resource Strain:   . Difficulty of Paying Living Expenses:   Food Insecurity:   . Worried About Charity fundraiser in the Last Year:   . Arboriculturist in the Last Year:   Transportation Needs:   . Film/video editor (Medical):   Marland Kitchen Lack of Transportation (Non-Medical):   Physical Activity:   . Days of Exercise per Week:   . Minutes of Exercise per Session:   Stress:   . Feeling of Stress :   Social Connections:   . Frequency of Communication with Friends and Family:   . Frequency of Social Gatherings with Friends and Family:   . Attends Religious Services:   . Active Member of Clubs or Organizations:   . Attends Archivist Meetings:   Marland Kitchen Marital Status:      Family History:  The patient's family history includes Asthma in his brother; Cardiomyopathy in his mother; Coronary artery disease in his brother and father; Heart attack in his father; Kidney disease in his sister.   ROS:   Please see the history of present illness.    ROS All other systems reviewed and are negative.   PHYSICAL EXAM:   VS:  BP 130/62   Pulse 66   Ht 6' (1.829 m)   Wt 221 lb 3.2 oz (100.3 kg)   SpO2 95%   BMI 30.00 kg/m    GEN: Well nourished, chronically ill appearing, in no acute distress  HEENT: normal  Neck: no JVD, carotid bruits, or masses Cardiac: RRR; no murmurs, rubs, or gallops, 1+ pitting edema  Respiratory:  clear to auscultation bilaterally, normal work of breathing GI: soft, nontender, distended with old hernia, + BS MS: no deformity or atrophy  Skin: warm and dry, no rash Neuro:  Alert and Oriented x 3, Strength and sensation are intact Psych: euthymic mood, full affect  Wt Readings from Last 3 Encounters:  01/17/20 221 lb 3.2 oz (100.3 kg)  01/03/20 215 lb 13.3 oz (97.9 kg)  01/03/20 219 lb (99.3 kg)      Studies/Labs Reviewed:    EKG:  EKG is ordered today.  The ekg ordered today demonstrates NSR with  incomplete LBBB and downsloping ST segment in lateral leads unchanged from prior EKG. I have personally reviewed and interpreted this study.   Recent Labs: 01/03/2020: ALT 19 01/09/2020: BUN 36; Creatinine, Ser 2.13; Hemoglobin 9.9; Platelets 178; Potassium 5.4; Sodium 136   Lipid Panel    Component Value Date/Time   CHOL 107 05/22/2017 0359   TRIG 212 (H) 05/22/2017 0359   HDL 36 (L) 05/22/2017 0359   CHOLHDL 3.0 05/22/2017 0359   VLDL 42 (H) 05/22/2017 0359   LDLCALC 29 05/22/2017 0359   LDLDIRECT 29.8 08/06/2013 0849    Additional studies/ records that were reviewed today include:   Cath 09/04/2012 Procedural Findings: Hemodynamics RA 13 RV 69/15 PA 64/21 mean 37 PCWP a wave 21 v wave 36, mean 21 LV 143/22 AO 143/60 mean 93  Oxygen saturations: PA 72 AO 91  Cardiac Output (Fick) 9.2  Cardiac Index (Fick) 4.3           Coronary  angiography: Coronary dominance: right  Left mainstem: Left main is long. There is diffuse 50% mid left main stenosis into 75% distal left main.  Left anterior descending (LAD): The LAD is totally occluded in its proximal aspect.  Left circumflex (LCx): The left circumflex is patent there is diffuse 50% proximal stenosis. There are 2 patent obtuse marginal branches.  Right coronary artery (RCA): The RCA is heavily calcified and severely diseased. The mid vessel is totally occluded.  Saphenous vein graft to PDA: Widely patent with patent stents in the proximal body of the graft.  Saphenous vein graft to PLA is patent there is mild in-stent restenosis in the proximal aspect of the graft but there is no significant disease noted. The PLA is a large branch with no significant stenosis  LIMA to LAD: Widely patent with no significant stenosis. The native vessel territory is more consistent with the diagonal territory than a true LAD and I suspect the native  LAD is an anatomic variant the doesn't reach the left ventricular apex.  Left ventriculography: Deferred because of chronic kidney disease  Final Conclusions:   1. Severe three-vessel native coronary artery disease 2. Continued patency of the saphenous vein graft to PDA, saphenous vein graft to PLA, and LIMA to LAD 3. Moderate pulmonary hypertension likely related to left heart disease 4. Elevated left ventricular and pulmonary wedge pressure with large V waves  Recommendations: Continued medical therapy for treatment of congestive heart failure.    Echo 01/10/2014 - Left ventricle: The cavity size was moderately dilated. Wall thickness was increased in a pattern of moderate LVH. There was focal basal hypertrophy. Systolic function was moderately reduced. The estimated ejection fraction was in the range of 35% to 40%. Moderate hypokinesis. - Mitral valve: Mild regurgitation. - Left atrium: The atrium was mildly dilated. - Right atrium: The atrium was mildly to moderately dilated. - Pulmonary arteries: Systolic pressure was moderately increased. PA peak pressure: 86m Hg (S).   Cardiac cath 06/17/17:  LEFT HEART CATH AND CORS/GRAFTS ANGIOGRAPHY  Conclusion    Prox RCA to Dist RCA lesion, 100 %stenosed.  Prox LAD lesion, 100 %stenosed.  LIMA graft was visualized by angiography and is large and anatomically normal.  SVG graft was visualized by angiography and is normal in caliber.  Origin to Prox Graft lesion, 0 %stenosed.  SVG graft was visualized by angiography and is normal in caliber.  Origin lesion, 30 %stenosed.  Origin to Prox Graft lesion, 0 %stenosed.  There is moderate to severe left ventricular systolic dysfunction.  LV end diastolic pressure is normal.   1. Severe 2 vessel occlusive CAD.    - 100% proximal LAD    - 100% mid RCA 2. Patent LIMA to the LAD 3. Patent SVG to PDA 4. Patent SVG to PLOM 5. Moderate to severe LV dysfunction. EF  estimated at 35%. 6. Normal LVEDP.  Plan: continue medical therapy. Consider GI referral for evaluation of his pain.      ASSESSMENT:    1. Acute on chronic combined systolic and diastolic CHF (congestive heart failure) ([place]   2. Coronary artery disease of bypass graft of native heart with stable angina pectoris ([place]   3. CKD (chronic kidney disease) stage 4, GFR 15-29 ml/min (HCC)   4. Pure hypercholesterolemia   5. Type 2 diabetes mellitus with complication, with long-term current use of insulin ([place]      PLAN:    1.CAD s/p CABG: Negative Myoview on 05/22/2017. Cardiac cath in 2018 showed  patent grafts. He is asymptomatic. Continue ASA, statin,, Coreg.   2. Acute on chronic combined systolic/diastolic CHF. Significantly volume overloaded after recent hospitalization related to IV fluids and holding diuretics due to AKI. Volume status improved but still volume overloaded. Will check BMET and BNP today. Continue lasix 40 mg bid but may adjust more depending on renal indices. On Coreg and losartan. If renal function still poor may need to stop losartan and consider hydralazine/isosorbide instead. Will update Echo.  3. Hyperlipidemia: On Lipitor 80 mg daily.  4. IDDM: On insulin. Per primary care.  5. HTN controlled.  6. CKD stage IV. Check lipid panel today.   7.  OSA on CPAP: continue current therapy  8. Osteomyelitis right foot on IV antibiotics. Severe below the knee PAD. Followed by Dr Oneida Alar  9. History of gout. Listed doses of allopurinol and colchicine are much too high given his renal dysfunction. Changed allopurinol to 100 mg daily and colchicine to 0.6 mg daily prn.  Follow up in 4 weeks.    Signed, Jessie Schrieber Martinique, MD  01/17/2020 3:31 PM    [facility] [address], [place], [place]  [address] Phone: [phone]; Fax: [phone]

## 2020-01-16 ENCOUNTER — Other Ambulatory Visit: Payer: Self-pay

## 2020-01-16 NOTE — Patient Outreach (Signed)
[facility]) Care Management  01/16/2020  Scott [name] 1933-06-25 [id number]    [name] RED ON EMMI ALERT Day # 4 Date: 01/15/2020 Red Alert Reason: "Other questions/problems? Yes"   Outreach attempt # 1 to patient. Spoke with patient who reports he is doing fairly well. Reviewed and addressed red alert with patient. He reports error in response. He denies having any questions/problems at this time. He states that he goes for cardiology apt tomorrow. He is continuing to take diuretic and wgt continues to come down. He denies any RN CM needs or concerns at this time. Patient has completed post discharge automated calls.   Plan: RN CM will close case at this time.   [name], RN,BSN,CCM [facility] Telephonic Care Management Coordinator Direct Phone: [phone] Toll Free: [phone] Fax: [phone]

## 2020-01-17 ENCOUNTER — Ambulatory Visit: Payer: Medicare HMO | Admitting: Cardiology

## 2020-01-17 ENCOUNTER — Encounter: Payer: Self-pay | Admitting: Cardiology

## 2020-01-17 ENCOUNTER — Other Ambulatory Visit: Payer: Self-pay

## 2020-01-17 VITALS — BP 130/62 | HR 66 | Ht 72.0 in | Wt 221.2 lb

## 2020-01-17 DIAGNOSIS — N184 Chronic kidney disease, stage 4 (severe): Secondary | ICD-10-CM | POA: Diagnosis not present

## 2020-01-17 DIAGNOSIS — Z794 Long term (current) use of insulin: Secondary | ICD-10-CM

## 2020-01-17 DIAGNOSIS — I130 Hypertensive heart and chronic kidney disease with heart failure and stage 1 through stage 4 chronic kidney disease, or unspecified chronic kidney disease: Secondary | ICD-10-CM | POA: Diagnosis not present

## 2020-01-17 DIAGNOSIS — E7800 Pure hypercholesterolemia, unspecified: Secondary | ICD-10-CM

## 2020-01-17 DIAGNOSIS — I25708 Atherosclerosis of coronary artery bypass graft(s), unspecified, with other forms of angina pectoris: Secondary | ICD-10-CM

## 2020-01-17 DIAGNOSIS — E1122 Type 2 diabetes mellitus with diabetic chronic kidney disease: Secondary | ICD-10-CM | POA: Diagnosis not present

## 2020-01-17 DIAGNOSIS — M869 Osteomyelitis, unspecified: Secondary | ICD-10-CM | POA: Diagnosis not present

## 2020-01-17 DIAGNOSIS — I5043 Acute on chronic combined systolic (congestive) and diastolic (congestive) heart failure: Secondary | ICD-10-CM | POA: Diagnosis not present

## 2020-01-17 DIAGNOSIS — E118 Type 2 diabetes mellitus with unspecified complications: Secondary | ICD-10-CM | POA: Diagnosis not present

## 2020-01-17 DIAGNOSIS — L97512 Non-pressure chronic ulcer of other part of right foot with fat layer exposed: Secondary | ICD-10-CM | POA: Diagnosis not present

## 2020-01-17 DIAGNOSIS — N183 Chronic kidney disease, stage 3 unspecified: Secondary | ICD-10-CM | POA: Diagnosis not present

## 2020-01-17 DIAGNOSIS — I5042 Chronic combined systolic (congestive) and diastolic (congestive) heart failure: Secondary | ICD-10-CM | POA: Diagnosis not present

## 2020-01-17 DIAGNOSIS — L03115 Cellulitis of right lower limb: Secondary | ICD-10-CM | POA: Diagnosis not present

## 2020-01-17 DIAGNOSIS — E1151 Type 2 diabetes mellitus with diabetic peripheral angiopathy without gangrene: Secondary | ICD-10-CM | POA: Diagnosis not present

## 2020-01-17 DIAGNOSIS — Z452 Encounter for adjustment and management of vascular access device: Secondary | ICD-10-CM | POA: Diagnosis not present

## 2020-01-17 DIAGNOSIS — M86171 Other acute osteomyelitis, right ankle and foot: Secondary | ICD-10-CM | POA: Diagnosis not present

## 2020-01-17 MED ORDER — COLCHICINE 0.6 MG PO TABS: 0.6000 mg | ORAL_TABLET | Freq: Every day | ORAL | 0 refills | Status: DC | PRN

## 2020-01-17 MED ORDER — ALLOPURINOL 100 MG PO TABS: 100.0000 mg | ORAL_TABLET | Freq: Every day | ORAL | 1 refills | Status: DC

## 2020-01-17 NOTE — Patient Instructions (Addendum)
Medication Instructions:  Continue same medications *If you need a refill on your cardiac medications before your next appointment, please call your pharmacy*   Lab Work: Bmet,cbc,bnp today    Testing/Procedures: Schedule Echo   Follow-Up: At [facility], you and your health needs are our priority.  As part of our continuing mission to provide you with exceptional heart care, we have created designated Provider Care Teams.  These Care Teams include your primary Cardiologist (physician) and Advanced Practice Providers (APPs -  Physician Assistants and Nurse Practitioners) who all work together to provide you with the care you need, when you need it.  We recommend signing up for the patient portal called "MyChart".  Sign up information is provided on this After Visit Summary.  MyChart is used to connect with patients for Virtual Visits (Telemedicine).  Patients are able to view lab/test results, encounter notes, upcoming appointments, etc.  Non-urgent messages can be sent to your provider as well.   To learn more about what you can do with MyChart, go to [email or web address].    Your next appointment: 1 month  Monday 02/18/20 at 2:00 pm   The format for your next appointment:Office     Provider: Dr.Jordan

## 2020-01-18 DIAGNOSIS — I2510 Atherosclerotic heart disease of native coronary artery without angina pectoris: Secondary | ICD-10-CM | POA: Diagnosis not present

## 2020-01-18 DIAGNOSIS — I509 Heart failure, unspecified: Secondary | ICD-10-CM | POA: Diagnosis not present

## 2020-01-18 DIAGNOSIS — I259 Chronic ischemic heart disease, unspecified: Secondary | ICD-10-CM | POA: Diagnosis not present

## 2020-01-18 DIAGNOSIS — R062 Wheezing: Secondary | ICD-10-CM | POA: Diagnosis not present

## 2020-01-18 LAB — BASIC METABOLIC PANEL
BUN/Creatinine Ratio: 16 (ref 10–24)
BUN: 27 mg/dL (ref 8–27)
CO2: 26 mmol/L (ref 20–29)
Calcium: 8.9 mg/dL (ref 8.6–10.2)
Chloride: 98 mmol/L (ref 96–106)
Creatinine, Ser: 1.64 mg/dL — ABNORMAL HIGH (ref 0.76–1.27)
GFR calc Af Amer: 43 mL/min/{1.73_m2} — ABNORMAL LOW (ref >59)
GFR calc non Af Amer: 38 mL/min/{1.73_m2} — ABNORMAL LOW (ref >59)
Glucose: 183 mg/dL — ABNORMAL HIGH (ref 65–99)
Potassium: 5.1 mmol/L (ref 3.5–5.2)
Sodium: 136 mmol/L (ref 134–144)

## 2020-01-18 LAB — CBC WITH DIFFERENTIAL/PLATELET
Basophils Absolute: 0.1 10*3/uL (ref 0.0–0.2)
Basos: 1 %
EOS (ABSOLUTE): 0.6 10*3/uL — ABNORMAL HIGH (ref 0.0–0.4)
Eos: 8 %
Hematocrit: 33.8 % — ABNORMAL LOW (ref 37.5–51.0)
Hemoglobin: 11.1 g/dL — ABNORMAL LOW (ref 13.0–17.7)
Immature Grans (Abs): 0.1 10*3/uL (ref 0.0–0.1)
Immature Granulocytes: 1 %
Lymphocytes Absolute: 1.3 10*3/uL (ref 0.7–3.1)
Lymphs: 16 %
MCH: 29.8 pg (ref 26.6–33.0)
MCHC: 32.8 g/dL (ref 31.5–35.7)
MCV: 91 fL (ref 79–97)
Monocytes Absolute: 1.2 10*3/uL — ABNORMAL HIGH (ref 0.1–0.9)
Monocytes: 15 %
Neutrophils Absolute: 5 10*3/uL (ref 1.4–7.0)
Neutrophils: 59 %
Platelets: 147 10*3/uL — ABNORMAL LOW (ref 150–450)
RBC: 3.72 x10E6/uL — ABNORMAL LOW (ref 4.14–5.80)
RDW: 13.5 % (ref 11.6–15.4)
WBC: 8.2 10*3/uL (ref 3.4–10.8)

## 2020-01-18 LAB — BRAIN NATRIURETIC PEPTIDE: BNP: 624.8 pg/mL — ABNORMAL HIGH (ref 0.0–100.0)

## 2020-01-19 DIAGNOSIS — K859 Acute pancreatitis without necrosis or infection, unspecified: Secondary | ICD-10-CM | POA: Diagnosis not present

## 2020-01-19 DIAGNOSIS — K861 Other chronic pancreatitis: Secondary | ICD-10-CM | POA: Diagnosis not present

## 2020-01-21 ENCOUNTER — Other Ambulatory Visit: Payer: Self-pay

## 2020-01-21 DIAGNOSIS — N183 Chronic kidney disease, stage 3 unspecified: Secondary | ICD-10-CM | POA: Diagnosis not present

## 2020-01-21 DIAGNOSIS — I5042 Chronic combined systolic (congestive) and diastolic (congestive) heart failure: Secondary | ICD-10-CM | POA: Diagnosis not present

## 2020-01-21 DIAGNOSIS — M86171 Other acute osteomyelitis, right ankle and foot: Secondary | ICD-10-CM | POA: Diagnosis not present

## 2020-01-21 DIAGNOSIS — I130 Hypertensive heart and chronic kidney disease with heart failure and stage 1 through stage 4 chronic kidney disease, or unspecified chronic kidney disease: Secondary | ICD-10-CM | POA: Diagnosis not present

## 2020-01-21 DIAGNOSIS — E1122 Type 2 diabetes mellitus with diabetic chronic kidney disease: Secondary | ICD-10-CM | POA: Diagnosis not present

## 2020-01-21 DIAGNOSIS — L97512 Non-pressure chronic ulcer of other part of right foot with fat layer exposed: Secondary | ICD-10-CM | POA: Diagnosis not present

## 2020-01-21 DIAGNOSIS — Z452 Encounter for adjustment and management of vascular access device: Secondary | ICD-10-CM | POA: Diagnosis not present

## 2020-01-21 DIAGNOSIS — L03115 Cellulitis of right lower limb: Secondary | ICD-10-CM | POA: Diagnosis not present

## 2020-01-21 DIAGNOSIS — E1151 Type 2 diabetes mellitus with diabetic peripheral angiopathy without gangrene: Secondary | ICD-10-CM | POA: Diagnosis not present

## 2020-01-24 ENCOUNTER — Encounter: Payer: Self-pay | Admitting: Vascular Surgery

## 2020-01-24 ENCOUNTER — Telehealth: Payer: Self-pay | Admitting: Cardiology

## 2020-01-24 DIAGNOSIS — E1151 Type 2 diabetes mellitus with diabetic peripheral angiopathy without gangrene: Secondary | ICD-10-CM | POA: Diagnosis not present

## 2020-01-24 DIAGNOSIS — E1122 Type 2 diabetes mellitus with diabetic chronic kidney disease: Secondary | ICD-10-CM | POA: Diagnosis not present

## 2020-01-24 DIAGNOSIS — L03115 Cellulitis of right lower limb: Secondary | ICD-10-CM | POA: Diagnosis not present

## 2020-01-24 DIAGNOSIS — I130 Hypertensive heart and chronic kidney disease with heart failure and stage 1 through stage 4 chronic kidney disease, or unspecified chronic kidney disease: Secondary | ICD-10-CM | POA: Diagnosis not present

## 2020-01-24 DIAGNOSIS — N183 Chronic kidney disease, stage 3 unspecified: Secondary | ICD-10-CM | POA: Diagnosis not present

## 2020-01-24 DIAGNOSIS — M86171 Other acute osteomyelitis, right ankle and foot: Secondary | ICD-10-CM | POA: Diagnosis not present

## 2020-01-24 DIAGNOSIS — I5042 Chronic combined systolic (congestive) and diastolic (congestive) heart failure: Secondary | ICD-10-CM | POA: Diagnosis not present

## 2020-01-24 DIAGNOSIS — L97512 Non-pressure chronic ulcer of other part of right foot with fat layer exposed: Secondary | ICD-10-CM | POA: Diagnosis not present

## 2020-01-24 DIAGNOSIS — Z452 Encounter for adjustment and management of vascular access device: Secondary | ICD-10-CM | POA: Diagnosis not present

## 2020-01-24 NOTE — Telephone Encounter (Signed)
Patient states that he needs to speak with Malachy Mood. He said there wasn't a certain message to get to her, just that he needs her to call him.

## 2020-01-25 ENCOUNTER — Telehealth: Payer: Self-pay | Admitting: Internal Medicine

## 2020-01-25 NOTE — Telephone Encounter (Signed)
Already spoke to patient 01/24/20.See previous lab note.

## 2020-01-27 DIAGNOSIS — K861 Other chronic pancreatitis: Secondary | ICD-10-CM | POA: Diagnosis not present

## 2020-01-27 DIAGNOSIS — K859 Acute pancreatitis without necrosis or infection, unspecified: Secondary | ICD-10-CM | POA: Diagnosis not present

## 2020-01-28 DIAGNOSIS — M86171 Other acute osteomyelitis, right ankle and foot: Secondary | ICD-10-CM | POA: Diagnosis not present

## 2020-01-28 DIAGNOSIS — I5042 Chronic combined systolic (congestive) and diastolic (congestive) heart failure: Secondary | ICD-10-CM | POA: Diagnosis not present

## 2020-01-28 DIAGNOSIS — E1122 Type 2 diabetes mellitus with diabetic chronic kidney disease: Secondary | ICD-10-CM | POA: Diagnosis not present

## 2020-01-28 DIAGNOSIS — I130 Hypertensive heart and chronic kidney disease with heart failure and stage 1 through stage 4 chronic kidney disease, or unspecified chronic kidney disease: Secondary | ICD-10-CM | POA: Diagnosis not present

## 2020-01-28 DIAGNOSIS — L03115 Cellulitis of right lower limb: Secondary | ICD-10-CM | POA: Diagnosis not present

## 2020-01-28 DIAGNOSIS — L97512 Non-pressure chronic ulcer of other part of right foot with fat layer exposed: Secondary | ICD-10-CM | POA: Diagnosis not present

## 2020-01-28 DIAGNOSIS — N183 Chronic kidney disease, stage 3 unspecified: Secondary | ICD-10-CM | POA: Diagnosis not present

## 2020-01-28 DIAGNOSIS — E1151 Type 2 diabetes mellitus with diabetic peripheral angiopathy without gangrene: Secondary | ICD-10-CM | POA: Diagnosis not present

## 2020-01-28 DIAGNOSIS — Z452 Encounter for adjustment and management of vascular access device: Secondary | ICD-10-CM | POA: Diagnosis not present

## 2020-01-29 ENCOUNTER — Ambulatory Visit: Payer: Medicare HMO | Admitting: Internal Medicine

## 2020-01-30 ENCOUNTER — Telehealth (HOSPITAL_COMMUNITY): Payer: Self-pay

## 2020-01-30 NOTE — Telephone Encounter (Signed)

## 2020-01-31 ENCOUNTER — Ambulatory Visit (INDEPENDENT_AMBULATORY_CARE_PROVIDER_SITE_OTHER): Payer: Medicare HMO | Admitting: Vascular Surgery

## 2020-01-31 ENCOUNTER — Encounter: Payer: Self-pay | Admitting: Vascular Surgery

## 2020-01-31 ENCOUNTER — Other Ambulatory Visit: Payer: Self-pay

## 2020-01-31 VITALS — BP 140/80 | HR 72 | Temp 97.3°F | Resp 18 | Ht 72.0 in | Wt 223.0 lb

## 2020-01-31 DIAGNOSIS — L03115 Cellulitis of right lower limb: Secondary | ICD-10-CM | POA: Diagnosis not present

## 2020-01-31 DIAGNOSIS — M86171 Other acute osteomyelitis, right ankle and foot: Secondary | ICD-10-CM | POA: Diagnosis not present

## 2020-01-31 DIAGNOSIS — E1122 Type 2 diabetes mellitus with diabetic chronic kidney disease: Secondary | ICD-10-CM | POA: Diagnosis not present

## 2020-01-31 DIAGNOSIS — E1151 Type 2 diabetes mellitus with diabetic peripheral angiopathy without gangrene: Secondary | ICD-10-CM | POA: Diagnosis not present

## 2020-01-31 DIAGNOSIS — M869 Osteomyelitis, unspecified: Secondary | ICD-10-CM

## 2020-01-31 DIAGNOSIS — I130 Hypertensive heart and chronic kidney disease with heart failure and stage 1 through stage 4 chronic kidney disease, or unspecified chronic kidney disease: Secondary | ICD-10-CM | POA: Diagnosis not present

## 2020-01-31 DIAGNOSIS — I5042 Chronic combined systolic (congestive) and diastolic (congestive) heart failure: Secondary | ICD-10-CM | POA: Diagnosis not present

## 2020-01-31 DIAGNOSIS — I739 Peripheral vascular disease, unspecified: Secondary | ICD-10-CM

## 2020-01-31 DIAGNOSIS — N183 Chronic kidney disease, stage 3 unspecified: Secondary | ICD-10-CM | POA: Diagnosis not present

## 2020-01-31 DIAGNOSIS — Z452 Encounter for adjustment and management of vascular access device: Secondary | ICD-10-CM | POA: Diagnosis not present

## 2020-01-31 DIAGNOSIS — L97512 Non-pressure chronic ulcer of other part of right foot with fat layer exposed: Secondary | ICD-10-CM | POA: Diagnosis not present

## 2020-01-31 NOTE — Progress Notes (Signed)
Patient is an 86 year old male who was recently seen for a draining sinus tract over his right fifth metatarsal.  This was consistent with osteomyelitis.  He was placed on a course of 6 weeks of IV antibiotics.  He returns today for further follow-up.  The erythema in his foot has dissipated significantly.  He has no drainage from his right foot.  He has 1 week of IV antibiotics remaining.  Of note he is also had increasing shortness of breath recently.  His Lasix dose has been intermittently doubled by Dr. Martinique.  He has follow-up scheduled with them next week.  Past Medical History:  Diagnosis Date  . Acute biliary pancreatitis 10/2017   with choledocholithiasis.  ERCP, stone extraction performed  . Arthritis   . CAD (coronary artery disease)    a. 1997 CABGx3 (VG->RPDA, VG->PLV, LIMA->LAD);  b. 07/2004 PCI VG->PLV (3.5x16 Taxus DES, 3.5x12 Taxus DES); c. 11/2009 PCI: VG->PLV 3.5x12 Promus DES), VG->RPDA (3.5x15 Promus DES); d. 08/2012 Cath: patent grafts; e. 05/2013 Lexiscan MV: no ischemia/infarction, EF 44%.  . Cancer [facility]) 2005   colon surgery done  . Cataract   . Chronic combined systolic and diastolic CHF (congestive heart failure) ([place]) 08/2012   a. EF 50-55% 11/2011; b. 08/2012 - EF 25-30%,  with mild RM, mod TR, mod RAE & LAE, moderate reduced RV systolic function and PASP of 40mHg. - > cath showed patent grafts & Mod Puml HTN wth elevated PCWP;  c. 01/2014 Echo: EF 35-40%, mod LVH, mod HK, mildly dil LA, mod dil RA, PASP 531mg.  . CKD (chronic kidney disease), stage III   . Dyslipidemia   . GERD (gastroesophageal reflux disease)   . Gout   . History of home oxygen therapy    uses oxygen with cpap low level not sure how many liters  . Hyperkalemia   . Hypertension   . Insulin Dependent Diabetes mellitus    type 2  . [place]  . Sleep apnea   . Thrombocytopenia [place]   dates back to 2011  . UTI (lower urinary tract infection) 08/2012   E.coli     Past Surgical History:  Procedure Laterality Date  . ABDOMINAL AORTOGRAM W/LOWER EXTREMITY Right 01/04/2020   Abdominal aortogram with right lower extremity runoff  . ABDOMINAL AORTOGRAM W/LOWER EXTREMITY Bilateral 01/04/2020   Procedure: ABDOMINAL AORTOGRAM W/LOWER EXTREMITY;  Surgeon: FiElam DutchMD;  [facility];  Service: Vascular;  Laterality: Bilateral;  . APPENDECTOMY    . BACK SURGERY     15 years ago lower  . CARDIAC CATHETERIZATION  2005   stenting of the vein graft to the posterior lateral per -- Dr. JoMartinique    . CARDIAC CATHETERIZATION  2011   showed right coronary, totally occluded LAD and severe stenosis in both saphenous vein graft to the posterior lateral and posterior descending  -- stenting of the proximal portion of the SVG to the posterior lateral branch in 2/11  . CHOLECYSTECTOMY    . CORONARY ARTERY BYPASS GRAFT  1997    (LIMA to LAD, SVG to PDA, SVG to PL)  . ERCP N/A 10/17/2017    PeIrene ShipperMD;  [facility]; choledocholithiasis, biliary pancreatitis.  s/p sphincterotomy and stone extraction.    . ESOPHAGOGASTRODUODENOSCOPY (EGD) WITH PROPOFOL N/A 09/21/2017   Procedure: ESOPHAGOGASTRODUODENOSCOPY (EGD) WITH PROPOFOL;  Surgeon: DaDoran StablerMD;  Location: [facility]OSCOPY;  Service: Gastroenterology;  Laterality: N/A;  . EYE  SURGERY     ioc for cataract  . HERNIA REPAIR    . LEFT AND RIGHT HEART CATHETERIZATION WITH CORONARY/GRAFT ANGIOGRAM  09/04/2012   Procedure: LEFT AND RIGHT HEART CATHETERIZATION WITH Beatrix Fetters;  Surgeon: Sherren Mocha, MD;  Location: [facility] CATH LAB;  patent grafts, mod 2ndary pulmonary HTN, elevated LV EDP & PCWP  . LEFT HEART CATH AND CORS/GRAFTS ANGIOGRAPHY N/A 06/17/2017   Procedure: LEFT HEART CATH AND CORS/GRAFTS ANGIOGRAPHY;  Surgeon: Martinique, Peter M, MD;  Location: [place] CV LAB;  Service: Cardiovascular;  Laterality: N/A;  . PARTIAL COLECTOMY     cancerous polyps  . stent to heart   last done 5 to 6 yrs ago   x 4   Current Outpatient Medications on File Prior to Visit  Medication Sig Dispense Refill  . allopurinol (ZYLOPRIM) 100 MG tablet Take 1 tablet (100 mg total) by mouth daily. 60 tablet 1  . aspirin 81 MG EC tablet Take 81 mg by mouth at bedtime.     Marland Kitchen atorvastatin (LIPITOR) 80 MG tablet Take 80 mg by mouth daily.     Marland Kitchen b complex vitamins tablet Take 1 tablet by mouth daily.    Marland Kitchen bismuth subsalicylate (PEPTO BISMOL) 262 MG/15ML suspension Take 30 mLs by mouth every 6 (six) hours as needed for indigestion.    . carvedilol (COREG) 12.5 MG tablet Take 1 tablet (12.5 mg total) by mouth 2 (two) times daily with a meal. 60 tablet 6  . ceFEPime (MAXIPIME) IVPB Inject 2 g into the vein daily. Indication:  cellulitis/osteomyeltis Last Day of Therapy:  02/08/20 Labs - Once weekly:  CBC/D and BMP, Labs - Every other week:  ESR and CRP 40 Units 0  . Cholecalciferol (VITAMIN D-3) 25 MCG (1000 UT) CAPS Take 1,000 Units by mouth every other day.    . colchicine 0.6 MG tablet Take 1 tablet (0.6 mg total) by mouth daily as needed. 60 tablet 0  . fluticasone (FLONASE) 50 MCG/ACT nasal spray Place 2 sprays into both nostrils daily as needed for allergies or rhinitis.    . furosemide (LASIX) 40 MG tablet TAKE 1/2 TABLETS BY MOUTH 2 TIMES DAILY (Patient taking differently: Take 40 mg by mouth 2 (two) times daily. ) 90 tablet 0  . gabapentin (NEURONTIN) 300 MG capsule Take 300 mg by mouth at bedtime.     . insulin aspart protamine- aspart (NOVOLOG MIX 70/30) (70-30) 100 UNIT/ML injection Inject 35 Units into the skin 2 (two) times daily before a meal.     . latanoprost (XALATAN) 0.005 % ophthalmic solution Place 1 drop into both eyes at bedtime.    Marland Kitchen loratadine (CLARITIN) 10 MG tablet Take 10 mg by mouth daily as needed (for seasonal allergies).     . losartan (COZAAR) 100 MG tablet Take 100 mg by mouth daily.    . metroNIDAZOLE (FLAGYL) 500 MG tablet Take 1 tablet (500 mg total) by  mouth every 8 (eight) hours. 90 tablet 0  . Multiple Vitamin (MULTIVITAMIN) tablet Take 1 tablet by mouth daily.      . nitroGLYCERIN (NITROSTAT) 0.4 MG SL tablet Place 1 tablet (0.4 mg total) under the tongue every 5 (five) minutes as needed for chest pain (up to 3 doses). 25 tablet 3  . pantoprazole (PROTONIX) 40 MG tablet Take 1 tablet (40 mg total) by mouth 2 (two) times daily. (Patient taking differently: Take 40 mg by mouth daily. ) 180 tablet 3  . PRESCRIPTION MEDICATION CPAP: At bedtime    .  tamsulosin (FLOMAX) 0.4 MG CAPS capsule Take 0.4 mg by mouth every evening.     . vancomycin IVPB Inject 750 mg into the vein daily. Indication:  Cellultitis/osteomyelitis  Last Day of Therapy:  02/08/20 Labs - Sunday/Monday:  CBC/D, BMP, and vancomycin trough. Labs - Thursday:  BMP and vancomycin trough Labs - Every other week:  ESR and CRP 30 Units 0  . Zinc 50 MG TABS      No current facility-administered medications on file prior to visit.    Physical exam:  Vitals:   01/31/20 1230  BP: 140/80  Pulse: 72  Resp: 18  Temp: (!) 97.3 F (36.3 C)  TempSrc: Temporal  SpO2: 97%  Weight: 223 lb (101.2 kg)  Height: 6' (1.829 m)    Right lower extremity: No palpable pedal pulses mild erythema over the right fifth metatarsal extending across a portion of the dorsum of the foot but much improved in appearance from his previous exam in March.      Data: Most recent serum creatinine was 1.6 on April 8.  Most recent CRP was normal.  His sed rate was only slightly elevated.  Assessment: Improved right foot with known osteomyelitis right fifth metatarsal head.  Plan: Patient will complete his antibiotics in 1 week.  We will then repeat his MRI 1 week after that.  If he has failed to clear the osteomyelitis at that point he will most likely need a right fifth toe ray amputation.  Most likely we would do angioplasty and/or stenting of his right superficial femoral artery and the origin of his  anterior tibial artery at the same procedure.  I will see the patient back in 2 weeks with his MRI findings.  [name], MD [facility] Office: [phone]

## 2020-02-02 DIAGNOSIS — K861 Other chronic pancreatitis: Secondary | ICD-10-CM | POA: Diagnosis not present

## 2020-02-02 DIAGNOSIS — K859 Acute pancreatitis without necrosis or infection, unspecified: Secondary | ICD-10-CM | POA: Diagnosis not present

## 2020-02-04 ENCOUNTER — Other Ambulatory Visit: Payer: Self-pay

## 2020-02-04 ENCOUNTER — Ambulatory Visit (HOSPITAL_COMMUNITY): Payer: Medicare HMO | Attending: Internal Medicine

## 2020-02-04 DIAGNOSIS — E118 Type 2 diabetes mellitus with unspecified complications: Secondary | ICD-10-CM | POA: Insufficient documentation

## 2020-02-04 DIAGNOSIS — Z794 Long term (current) use of insulin: Secondary | ICD-10-CM | POA: Diagnosis not present

## 2020-02-04 DIAGNOSIS — I25708 Atherosclerosis of coronary artery bypass graft(s), unspecified, with other forms of angina pectoris: Secondary | ICD-10-CM | POA: Diagnosis not present

## 2020-02-04 DIAGNOSIS — E7800 Pure hypercholesterolemia, unspecified: Secondary | ICD-10-CM | POA: Insufficient documentation

## 2020-02-04 DIAGNOSIS — I5043 Acute on chronic combined systolic (congestive) and diastolic (congestive) heart failure: Secondary | ICD-10-CM | POA: Diagnosis not present

## 2020-02-04 DIAGNOSIS — N184 Chronic kidney disease, stage 4 (severe): Secondary | ICD-10-CM | POA: Diagnosis not present

## 2020-02-04 NOTE — Telephone Encounter (Signed)
Returned the call to the patient. He stated that he has been having abdominal swelling and lower extremity edema for a while now. He feels like most of the swelling is in his abdomen. He is currently on Furosemide 40 mg bid.   According to lab results on 4/15, the patient was advised to increase lasix to 80 mg twice a day for 3 days then return to normal dose 40 mg twice a day. He stated that after this his weight was 222 pounds and it has stayed that way since. He did not notice a different in urine output when he was on the 80 mg bid.   He has shortness of breath when ambulating. He stated that he watches his salt intake but can only eat half the amount he normally does.   He has a follow up appointment on 02/18/20 with Dr. Martinique.

## 2020-02-04 NOTE — Telephone Encounter (Signed)
Pt c/o swelling: STAT is pt has developed SOB within 24 hours  1) How much weight have you gained and in what time span? No  2) If swelling, where is the swelling located? Around stomach  3) Are you currently taking a fluid pill? Yes  4) Are you currently SOB? Yes  5) Do you have a log of your daily weights (if so, list)?   222 lbs  225 lbs  6) Have you gained 3 pounds in a day or 5 pounds in a week? No  7) Have you traveled recently? No

## 2020-02-05 DIAGNOSIS — L03115 Cellulitis of right lower limb: Secondary | ICD-10-CM | POA: Diagnosis not present

## 2020-02-05 DIAGNOSIS — N183 Chronic kidney disease, stage 3 unspecified: Secondary | ICD-10-CM | POA: Diagnosis not present

## 2020-02-05 DIAGNOSIS — M86171 Other acute osteomyelitis, right ankle and foot: Secondary | ICD-10-CM | POA: Diagnosis not present

## 2020-02-05 DIAGNOSIS — I5042 Chronic combined systolic (congestive) and diastolic (congestive) heart failure: Secondary | ICD-10-CM | POA: Diagnosis not present

## 2020-02-05 DIAGNOSIS — E1122 Type 2 diabetes mellitus with diabetic chronic kidney disease: Secondary | ICD-10-CM | POA: Diagnosis not present

## 2020-02-05 DIAGNOSIS — L97512 Non-pressure chronic ulcer of other part of right foot with fat layer exposed: Secondary | ICD-10-CM | POA: Diagnosis not present

## 2020-02-05 DIAGNOSIS — E1151 Type 2 diabetes mellitus with diabetic peripheral angiopathy without gangrene: Secondary | ICD-10-CM | POA: Diagnosis not present

## 2020-02-05 DIAGNOSIS — Z452 Encounter for adjustment and management of vascular access device: Secondary | ICD-10-CM | POA: Diagnosis not present

## 2020-02-05 DIAGNOSIS — I130 Hypertensive heart and chronic kidney disease with heart failure and stage 1 through stage 4 chronic kidney disease, or unspecified chronic kidney disease: Secondary | ICD-10-CM | POA: Diagnosis not present

## 2020-02-05 NOTE — Telephone Encounter (Signed)
Spoke to patient he stated he would like to see Dr.Jordan before 5/10.Appointment scheduled with Dr.Jordan 4/28 at 8:00 am.

## 2020-02-06 ENCOUNTER — Other Ambulatory Visit: Payer: Self-pay

## 2020-02-06 ENCOUNTER — Encounter: Payer: Self-pay | Admitting: Cardiology

## 2020-02-06 ENCOUNTER — Ambulatory Visit: Payer: Medicare HMO | Admitting: Cardiology

## 2020-02-06 VITALS — BP 138/76 | HR 65 | Temp 97.1°F | Ht 72.0 in | Wt 226.0 lb

## 2020-02-06 DIAGNOSIS — Z794 Long term (current) use of insulin: Secondary | ICD-10-CM

## 2020-02-06 DIAGNOSIS — E118 Type 2 diabetes mellitus with unspecified complications: Secondary | ICD-10-CM

## 2020-02-06 DIAGNOSIS — I5043 Acute on chronic combined systolic (congestive) and diastolic (congestive) heart failure: Secondary | ICD-10-CM

## 2020-02-06 DIAGNOSIS — I25708 Atherosclerosis of coronary artery bypass graft(s), unspecified, with other forms of angina pectoris: Secondary | ICD-10-CM | POA: Diagnosis not present

## 2020-02-06 DIAGNOSIS — N184 Chronic kidney disease, stage 4 (severe): Secondary | ICD-10-CM

## 2020-02-06 MED ORDER — ALPRAZOLAM 0.25 MG PO TABS: ORAL_TABLET | ORAL | 0 refills | Status: DC

## 2020-02-06 MED ORDER — FUROSEMIDE 40 MG PO TABS: 80.0000 mg | ORAL_TABLET | Freq: Two times a day (BID) | ORAL | 3 refills | Status: DC

## 2020-02-06 NOTE — Addendum Note (Signed)
Addended by: Kathyrn Lass on: 02/06/2020 08:20 AM   Modules accepted: Orders

## 2020-02-06 NOTE — Progress Notes (Signed)
Cardiology Office Note    Date:  02/06/2020   ID:  Scott [name], DOB 25-May-1933, MRN [id number]  PCP:  Reubin Milan, MD  Cardiologist:  Dr. Martinique  Chief Complaint  Patient presents with  . Congestive Heart Failure    History of Present Illness:  Scott [name] is a 86 y.o. male seen for post hospital follow up with cellulitis and leg swelling. Last seen in September 2018. He has a  PMH of CAD s/p CABG (LIMA to LAD, SVG to PLA and SVG to PDA), HTN, HLD, chronic combined systolic and diastolic HF, IDDM, stage III CKD, gout, UTI, OSA on CPAP and dietary noncompliance. EF was 25-30% on echocardiogram in December 2013. Cardiac catheterization was on 09/04/2012 which showed diffuse 50% mid left main stenosis into 75% distal left main, diffuse 50% left circumflex stenosis, mid RCA was totally occluded, widely patent SVG to PDA, patent SVG to PLA with mild in-stent restenosis in the proximal aspect of the graft, patent LIMA to LAD. Echocardiogram obtained on 01/10/2014 showed EF has improved to 35-40%, moderate LVH, mild MR, PA peak pressure 50 mmHg. He was  admitted with chest pain on 05/21/2017. Serial troponin was borderline. He eventually underwent Myoview on 05/22/2017 which showed a small area of questionable ischemia versus attenuation artifact but overall felt to be low risk. He was able to ambulate without recurrent symptom limitation. Therefore he was discharged on the same day on 30 mg Imdur, lisinopril was discontinued. He continued to have symptoms so underwent cardiac cath that showed occlusion of the RCA and LAD but all grafts were patent.   He was admitted in January 2019 with gallstone pancreatitis treated with ERCP.   He was admitted in March 2021 with Cellulitis of the right foot with osteomyelitis. Angiogram showed severe anterior tibial disease with occlusion of the peroneal and PT vessels. Treated medically. He did have AKI with creatinine up to 2.86 before declining to 2.17 with IV  hydration and holding diuretics. Post DC he noted significant weight gain and edema and lasix was resumed. His weight increased to 240 lbs. He took lasix 80 mg bid for 3 days and is now back on 40 mg bid. Breathing is much better but still has some SOB. Weight has come down but he feels like he still has 10 extra pound of fluid. He has a PICC line in place now and still on antibiotics.   He was seen on 01/17/20. Labs revealed Hgb 11.1. creatinine improved to 1.64. BNP 624. Echo was updated as noted below and was really unchanged from prior. He has seen Dr Oneida Alar in follow up and MRI of foot planned next week.   Since his last visit he has gained weight. Notes increase in abdominal girth and LE edema. Not eating much. Thinks dry weight around 210 lbs. Having difficulty sleeping.    Past Medical History:  Diagnosis Date  . Acute biliary pancreatitis 10/2017   with choledocholithiasis.  ERCP, stone extraction performed  . Arthritis   . CAD (coronary artery disease)    a. 1997 CABGx3 (VG->RPDA, VG->PLV, LIMA->LAD);  b. 07/2004 PCI VG->PLV (3.5x16 Taxus DES, 3.5x12 Taxus DES); c. 11/2009 PCI: VG->PLV 3.5x12 Promus DES), VG->RPDA (3.5x15 Promus DES); d. 08/2012 Cath: patent grafts; e. 05/2013 Lexiscan MV: no ischemia/infarction, EF 44%.  . Cancer [facility]) 2005   colon surgery done  . Cataract   . Chronic combined systolic and diastolic CHF (congestive heart failure) ([place]) 08/2012   a. EF 50-55%  11/2011; b. 08/2012 - EF 25-30%,  with mild RM, mod TR, mod RAE & LAE, moderate reduced RV systolic function and PASP of 38mHg. - > cath showed patent grafts & Mod Puml HTN wth elevated PCWP;  c. 01/2014 Echo: EF 35-40%, mod LVH, mod HK, mildly dil LA, mod dil RA, PASP 525mg.  . CKD (chronic kidney disease), stage III   . Dyslipidemia   . GERD (gastroesophageal reflux disease)   . Gout   . History of home oxygen therapy    uses oxygen with cpap low level not sure how many liters  . Hyperkalemia   . Hypertension     . Insulin Dependent Diabetes mellitus    type 2  . Myocardial infarction ([facility]  . Sleep apnea   . Thrombocytopenia ([facility]2011   dates back to 2011  . UTI (lower urinary tract infection) 08/2012   E.coli    Past Surgical History:  Procedure Laterality Date  . ABDOMINAL AORTOGRAM W/LOWER EXTREMITY Right 01/04/2020   Abdominal aortogram with right lower extremity runoff  . ABDOMINAL AORTOGRAM W/LOWER EXTREMITY Bilateral 01/04/2020   Procedure: ABDOMINAL AORTOGRAM W/LOWER EXTREMITY;  Surgeon: FiElam DutchMD;  [facility];  Service: Vascular;  Laterality: Bilateral;  . APPENDECTOMY    . BACK SURGERY     15 years ago lower  . CARDIAC CATHETERIZATION  2005   stenting of the vein graft to the posterior lateral per -- Dr. JoMartinique    . CARDIAC CATHETERIZATION  2011   showed right coronary, totally occluded LAD and severe stenosis in both saphenous vein graft to the posterior lateral and posterior descending  -- stenting of the proximal portion of the SVG to the posterior lateral branch in 2/11  . CHOLECYSTECTOMY    . CORONARY ARTERY BYPASS GRAFT  1997    (LIMA to LAD, SVG to PDA, SVG to PL)  . ERCP N/A 10/17/2017    PeIrene ShipperMD;  [facility]; choledocholithiasis, biliary pancreatitis.  s/p sphincterotomy and stone extraction.    . ESOPHAGOGASTRODUODENOSCOPY (EGD) WITH PROPOFOL N/A 09/21/2017   Procedure: ESOPHAGOGASTRODUODENOSCOPY (EGD) WITH PROPOFOL;  Surgeon: DaDoran StablerMD;  Location: [facility];  Service: Gastroenterology;  Laterality: N/A;  . EYE SURGERY     ioc for cataract  . HERNIA REPAIR    . LEFT AND RIGHT HEART CATHETERIZATION WITH CORONARY/GRAFT ANGIOGRAM  09/04/2012   Procedure: LEFT AND RIGHT HEART CATHETERIZATION WITH COBeatrix Fetters Surgeon: MiSherren MochaMD;  [facility];  patent grafts, mod 2ndary pulmonary HTN, elevated LV EDP & PCWP  . LEFT HEART CATH AND CORS/GRAFTS ANGIOGRAPHY N/A 06/17/2017    Procedure: LEFT HEART CATH AND CORS/GRAFTS ANGIOGRAPHY;  Surgeon: JoMartiniquePeter M, MD;  [facility];  Service: Cardiovascular;  Laterality: N/A;  . PARTIAL COLECTOMY     cancerous polyps  . stent to heart  last done 5 to 6 yrs ago   x 4    Current Medications: Outpatient Medications Prior to Visit  Medication Sig Dispense Refill  . allopurinol (ZYLOPRIM) 100 MG tablet Take 1 tablet (100 mg total) by mouth daily. 60 tablet 1  . aspirin 81 MG EC tablet Take 81 mg by mouth at bedtime.     . Marland Kitchentorvastatin (LIPITOR) 80 MG tablet Take 80 mg by mouth daily.     . Marland Kitchen complex vitamins tablet Take 1 tablet by mouth daily.    . Marland Kitchenismuth subsalicylate (PEPTO BISMOL)  262 MG/15ML suspension Take 30 mLs by mouth every 6 (six) hours as needed for indigestion.    . carvedilol (COREG) 12.5 MG tablet Take 1 tablet (12.5 mg total) by mouth 2 (two) times daily with a meal. 60 tablet 6  . Cholecalciferol (VITAMIN D-3) 25 MCG (1000 UT) CAPS Take 1,000 Units by mouth every other day.    . colchicine 0.6 MG tablet Take 1 tablet (0.6 mg total) by mouth daily as needed. 60 tablet 0  . fluticasone (FLONASE) 50 MCG/ACT nasal spray Place 2 sprays into both nostrils daily as needed for allergies or rhinitis.    . furosemide (LASIX) 40 MG tablet TAKE 1/2 TABLETS BY MOUTH 2 TIMES DAILY (Patient taking differently: Take 40 mg by mouth 2 (two) times daily. ) 90 tablet 0  . gabapentin (NEURONTIN) 300 MG capsule Take 300 mg by mouth at bedtime.     . insulin aspart protamine- aspart (NOVOLOG MIX 70/30) (70-30) 100 UNIT/ML injection Inject 35 Units into the skin 2 (two) times daily before a meal.     . latanoprost (XALATAN) 0.005 % ophthalmic solution Place 1 drop into both eyes at bedtime.    Marland Kitchen loratadine (CLARITIN) 10 MG tablet Take 10 mg by mouth daily as needed (for seasonal allergies).     . losartan (COZAAR) 100 MG tablet Take 100 mg by mouth daily.    . metroNIDAZOLE (FLAGYL) 500 MG tablet Take 1 tablet (500  mg total) by mouth every 8 (eight) hours. 90 tablet 0  . Multiple Vitamin (MULTIVITAMIN) tablet Take 1 tablet by mouth daily.      . nitroGLYCERIN (NITROSTAT) 0.4 MG SL tablet Place 1 tablet (0.4 mg total) under the tongue every 5 (five) minutes as needed for chest pain (up to 3 doses). 25 tablet 3  . pantoprazole (PROTONIX) 40 MG tablet Take 1 tablet (40 mg total) by mouth 2 (two) times daily. (Patient taking differently: Take 40 mg by mouth daily. ) 180 tablet 3  . PRESCRIPTION MEDICATION CPAP: At bedtime    . tamsulosin (FLOMAX) 0.4 MG CAPS capsule Take 0.4 mg by mouth every evening.     . vancomycin (VANCOCIN) 10 G SOLR injection     . vancomycin IVPB Inject 750 mg into the vein daily. Indication:  Cellultitis/osteomyelitis  Last Day of Therapy:  02/08/20 Labs - Sunday/Monday:  CBC/D, BMP, and vancomycin trough. Labs - Thursday:  BMP and vancomycin trough Labs - Every other week:  ESR and CRP 30 Units 0  . Zinc 50 MG TABS     . ceFEPime (MAXIPIME) IVPB Inject 2 g into the vein daily. Indication:  cellulitis/osteomyeltis Last Day of Therapy:  02/08/20 Labs - Once weekly:  CBC/D and BMP, Labs - Every other week:  ESR and CRP 40 Units 0   No facility-administered medications prior to visit.     Allergies:   Patient has no known allergies.   Social History   Socioeconomic History  . Marital status: Single    Spouse name: Not on file  . Number of children: 0  . Years of education: Not on file  . Highest education level: Not on file  Occupational History  . Occupation: retired   ran a Office manager course  Tobacco Use  . Smoking status: Former Smoker    Packs/day: 1.50    Years: 35.00    Pack years: 52.50    Types: Cigarettes    Quit date: 10/12/1983    Years since quitting: 36.3  .  Smokeless tobacco: Never Used  Substance and Sexual Activity  . Alcohol use: Yes    Alcohol/week: 0.0 standard drinks    Comment: 2-3 drinks 3-4x/wk.  . Drug use: No  . Sexual activity: Not Currently     Birth control/protection: None  Other Topics Concern  . Not on file  Social History Narrative   Lives in [place] by himself.  Active but does not routinely exercise.   Social Determinants of Health   Financial Resource Strain:   . Difficulty of Paying Living Expenses:   Food Insecurity:   . Worried About Charity fundraiser in the Last Year:   . Arboriculturist in the Last Year:   Transportation Needs:   . Film/video editor (Medical):   Marland Kitchen Lack of Transportation (Non-Medical):   Physical Activity:   . Days of Exercise per Week:   . Minutes of Exercise per Session:   Stress:   . Feeling of Stress :   Social Connections:   . Frequency of Communication with Friends and Family:   . Frequency of Social Gatherings with Friends and Family:   . Attends Religious Services:   . Active Member of Clubs or Organizations:   . Attends Archivist Meetings:   Marland Kitchen Marital Status:      Family History:  The patient's family history includes Asthma in his brother; Cardiomyopathy in his mother; Coronary artery disease in his brother and father; Heart attack in his father; Kidney disease in his sister.   ROS:   Please see the history of present illness.    ROS All other systems reviewed and are negative.   PHYSICAL EXAM:   VS:  BP 138/76   Pulse 65   Temp (!) 97.1 F (36.2 C)   Ht 6' (1.829 m)   Wt 226 lb (102.5 kg)   SpO2 96%   BMI 30.65 kg/m    GEN: Well nourished, chronically ill appearing, in no acute distress  HEENT: normal  Neck: no JVD, carotid bruits, or masses Cardiac: RRR; no murmurs, rubs, or gallops, 1-2+ pitting edema  Respiratory:  clear to auscultation bilaterally, normal work of breathing GI: soft,  distended and tense with old hernia, + BS MS: no deformity or atrophy  Skin: warm and dry, no rash Neuro:  Alert and Oriented x 3, Strength and sensation are intact Psych: euthymic mood, full affect  Wt Readings from Last 3 Encounters:  02/06/20 226 lb (102.5  kg)  01/31/20 223 lb (101.2 kg)  01/17/20 221 lb 3.2 oz (100.3 kg)      Studies/Labs Reviewed:   EKG:  EKG is not ordered today.     Recent Labs: 01/03/2020: ALT 19 01/17/2020: BNP 624.8; BUN 27; Creatinine, Ser 1.64; Hemoglobin 11.1; Platelets 147; Potassium 5.1; Sodium 136   Lipid Panel    Component Value Date/Time   CHOL 107 05/22/2017 0359   TRIG 212 (H) 05/22/2017 0359   HDL 36 (L) 05/22/2017 0359   CHOLHDL 3.0 05/22/2017 0359   VLDL 42 (H) 05/22/2017 0359   LDLCALC 29 05/22/2017 0359   LDLDIRECT 29.8 08/06/2013 0849    Additional studies/ records that were reviewed today include:   Cath 09/04/2012 Procedural Findings: Hemodynamics RA 13 RV 69/15 PA 64/21 mean 37 PCWP a wave 21 v wave 36, mean 21 LV 143/22 AO 143/60 mean 93  Oxygen saturations: PA 72 AO 91  Cardiac Output (Fick) 9.2  Cardiac Index (Fick) 4.3  Coronary angiography: Coronary dominance: right  Left mainstem: Left main is long. There is diffuse 50% mid left main stenosis into 75% distal left main.  Left anterior descending (LAD): The LAD is totally occluded in its proximal aspect.  Left circumflex (LCx): The left circumflex is patent there is diffuse 50% proximal stenosis. There are 2 patent obtuse marginal branches.  Right coronary artery (RCA): The RCA is heavily calcified and severely diseased. The mid vessel is totally occluded.  Saphenous vein graft to PDA: Widely patent with patent stents in the proximal body of the graft.  Saphenous vein graft to PLA is patent there is mild in-stent restenosis in the proximal aspect of the graft but there is no significant disease noted. The PLA is a large branch with no significant stenosis  LIMA to LAD: Widely patent with no significant stenosis. The native vessel territory is more consistent with the diagonal territory than a true LAD and I suspect the native LAD is an anatomic variant the doesn't reach the left ventricular  apex.  Left ventriculography: Deferred because of chronic kidney disease  Final Conclusions:   1. Severe three-vessel native coronary artery disease 2. Continued patency of the saphenous vein graft to PDA, saphenous vein graft to PLA, and LIMA to LAD 3. Moderate pulmonary hypertension likely related to left heart disease 4. Elevated left ventricular and pulmonary wedge pressure with large V waves  Recommendations: Continued medical therapy for treatment of congestive heart failure.    Echo 01/10/2014 - Left ventricle: The cavity size was moderately dilated. Wall thickness was increased in a pattern of moderate LVH. There was focal basal hypertrophy. Systolic function was moderately reduced. The estimated ejection fraction was in the range of 35% to 40%. Moderate hypokinesis. - Mitral valve: Mild regurgitation. - Left atrium: The atrium was mildly dilated. - Right atrium: The atrium was mildly to moderately dilated. - Pulmonary arteries: Systolic pressure was moderately increased. PA peak pressure: 45m Hg (S).   Cardiac cath 06/17/17:  LEFT HEART CATH AND CORS/GRAFTS ANGIOGRAPHY  Conclusion    Prox RCA to Dist RCA lesion, 100 %stenosed.  Prox LAD lesion, 100 %stenosed.  LIMA graft was visualized by angiography and is large and anatomically normal.  SVG graft was visualized by angiography and is normal in caliber.  Origin to Prox Graft lesion, 0 %stenosed.  SVG graft was visualized by angiography and is normal in caliber.  Origin lesion, 30 %stenosed.  Origin to Prox Graft lesion, 0 %stenosed.  There is moderate to severe left ventricular systolic dysfunction.  LV end diastolic pressure is normal.   1. Severe 2 vessel occlusive CAD.    - 100% proximal LAD    - 100% mid RCA 2. Patent LIMA to the LAD 3. Patent SVG to PDA 4. Patent SVG to PLOM 5. Moderate to severe LV dysfunction. EF estimated at 35%. 6. Normal LVEDP.  Plan: continue medical  therapy. Consider GI referral for evaluation of his pain.    Echo 02/04/20: IMPRESSIONS    1. Left ventricular ejection fraction, by estimation, is 35%%. The left  ventricle has severely decreased function. The left ventricle demonstrates  global hypokinesis. The left ventricular internal cavity size was severely  dilated. Left ventricular  diastolic parameters are indeterminate.  2. Right ventricular systolic function is mildly reduced. The right  ventricular size is mildly enlarged. There is moderately elevated  pulmonary artery systolic pressure.  3. Left atrial size was mild to moderately dilated.  4. Right atrial size was moderately  dilated.  5. The mitral valve is abnormal. Mild to moderate mitral valve  regurgitation.  6. The aortic valve is abnormal. Aortic valve regurgitation is mild. Mild  aortic valve sclerosis is present, with no evidence of aortic valve  stenosis.  7. Aortic dilatation noted. There is mild dilatation of the ascending  aorta measuring 41 mm.  8. The inferior vena cava is dilated in size with <50% respiratory  variability, suggesting right atrial pressure of 15 mmHg.   Comparison(s): The left ventricular function is unchanged.   ASSESSMENT:    1. Acute on chronic combined systolic and diastolic CHF (congestive heart failure) ([place])   2. Coronary artery disease of bypass graft of native heart with stable angina pectoris ([place])   3. CKD (chronic kidney disease) stage 4, GFR 15-29 ml/min (H[facility])   4. Type 2 diabetes mellitus with complication, with long-term current use of insulin ([place])      PLAN:    1.CAD s/p CABG: Negative Myoview on 05/22/2017. Cardiac cath in 2018 showed patent grafts. He is asymptomatic. Continue ASA, statin, Coreg.   2. Acute on chronic combined systolic/diastolic CHF. He is still significantly volume overloaded. Echo is stable with poor LV function. Will increase  lasix to 80 mg bid. On Coreg and losartan. Plan on follow up  in 2 weeks with BMET and BNP. If he doesn't respond to higher lasix dose may need periodic metolazone.   3. Hyperlipidemia: On Lipitor 80 mg daily.  4. IDDM: On insulin. Per primary care.  5. HTN controlled.  6. CKD stage IV. Renal indices improved on last lab.   7.  OSA on CPAP: continue current therapy  8. Osteomyelitis right foot on IV antibiotics. Severe below the knee PAD. Followed by Dr Oneida Alar. MRI next week.   9. History of gout. On allopurinol.  Follow up in 2 weeks with BMET and BNP   Signed, Lyncoln Ledgerwood Martinique, MD  02/06/2020 8:14 AM    [facility] [place], [place],   [address] Phone: [phone]; Fax: [phone]

## 2020-02-06 NOTE — Patient Instructions (Signed)
Increase lasix to 80 mg twice a day  We will arrange follow up in 2 weeks with repeat lab

## 2020-02-07 ENCOUNTER — Encounter: Payer: Self-pay | Admitting: Vascular Surgery

## 2020-02-07 DIAGNOSIS — L03115 Cellulitis of right lower limb: Secondary | ICD-10-CM | POA: Diagnosis not present

## 2020-02-07 DIAGNOSIS — I5042 Chronic combined systolic (congestive) and diastolic (congestive) heart failure: Secondary | ICD-10-CM | POA: Diagnosis not present

## 2020-02-07 DIAGNOSIS — I130 Hypertensive heart and chronic kidney disease with heart failure and stage 1 through stage 4 chronic kidney disease, or unspecified chronic kidney disease: Secondary | ICD-10-CM | POA: Diagnosis not present

## 2020-02-07 DIAGNOSIS — E1151 Type 2 diabetes mellitus with diabetic peripheral angiopathy without gangrene: Secondary | ICD-10-CM | POA: Diagnosis not present

## 2020-02-07 DIAGNOSIS — M86171 Other acute osteomyelitis, right ankle and foot: Secondary | ICD-10-CM | POA: Diagnosis not present

## 2020-02-07 DIAGNOSIS — L97512 Non-pressure chronic ulcer of other part of right foot with fat layer exposed: Secondary | ICD-10-CM | POA: Diagnosis not present

## 2020-02-07 DIAGNOSIS — E1122 Type 2 diabetes mellitus with diabetic chronic kidney disease: Secondary | ICD-10-CM | POA: Diagnosis not present

## 2020-02-07 DIAGNOSIS — Z452 Encounter for adjustment and management of vascular access device: Secondary | ICD-10-CM | POA: Diagnosis not present

## 2020-02-07 DIAGNOSIS — N183 Chronic kidney disease, stage 3 unspecified: Secondary | ICD-10-CM | POA: Diagnosis not present

## 2020-02-09 DIAGNOSIS — Z452 Encounter for adjustment and management of vascular access device: Secondary | ICD-10-CM | POA: Diagnosis not present

## 2020-02-09 DIAGNOSIS — E1122 Type 2 diabetes mellitus with diabetic chronic kidney disease: Secondary | ICD-10-CM | POA: Diagnosis not present

## 2020-02-09 DIAGNOSIS — E1151 Type 2 diabetes mellitus with diabetic peripheral angiopathy without gangrene: Secondary | ICD-10-CM | POA: Diagnosis not present

## 2020-02-09 DIAGNOSIS — M86171 Other acute osteomyelitis, right ankle and foot: Secondary | ICD-10-CM | POA: Diagnosis not present

## 2020-02-09 DIAGNOSIS — L03115 Cellulitis of right lower limb: Secondary | ICD-10-CM | POA: Diagnosis not present

## 2020-02-09 DIAGNOSIS — I130 Hypertensive heart and chronic kidney disease with heart failure and stage 1 through stage 4 chronic kidney disease, or unspecified chronic kidney disease: Secondary | ICD-10-CM | POA: Diagnosis not present

## 2020-02-09 DIAGNOSIS — I5042 Chronic combined systolic (congestive) and diastolic (congestive) heart failure: Secondary | ICD-10-CM | POA: Diagnosis not present

## 2020-02-09 DIAGNOSIS — N183 Chronic kidney disease, stage 3 unspecified: Secondary | ICD-10-CM | POA: Diagnosis not present

## 2020-02-09 DIAGNOSIS — L97512 Non-pressure chronic ulcer of other part of right foot with fat layer exposed: Secondary | ICD-10-CM | POA: Diagnosis not present

## 2020-02-12 ENCOUNTER — Other Ambulatory Visit: Payer: Self-pay

## 2020-02-12 ENCOUNTER — Ambulatory Visit
Admission: RE | Admit: 2020-02-12 | Discharge: 2020-02-12 | Disposition: A | Discharge status: Home / Self Care | Payer: Medicare HMO | Source: Ambulatory Visit | Attending: Vascular Surgery | Admitting: Vascular Surgery

## 2020-02-12 DIAGNOSIS — L97519 Non-pressure chronic ulcer of other part of right foot with unspecified severity: Secondary | ICD-10-CM | POA: Diagnosis not present

## 2020-02-12 DIAGNOSIS — S91301A Unspecified open wound, right foot, initial encounter: Secondary | ICD-10-CM | POA: Diagnosis not present

## 2020-02-12 DIAGNOSIS — M869 Osteomyelitis, unspecified: Secondary | ICD-10-CM

## 2020-02-12 MED ORDER — GADOBENATE DIMEGLUMINE 529 MG/ML IV SOLN
15.0000 mL | Freq: Once | INTRAVENOUS | Status: AC | PRN
  Administered 2020-02-12: 15 mL via INTRAVENOUS

## 2020-02-12 NOTE — Progress Notes (Signed)
Cardiology Office Note    Date:  02/18/2020   ID:  Scott [name], DOB 17-Apr-1933, MRN [id number]  PCP:  Reubin Milan, MD  Cardiologist:  Dr. Martinique  Chief Complaint  Patient presents with  . Follow-up    Post echo.  . Shortness of Breath    History of Present Illness:  Scott [name] is a 86 y.o. male seen for post hospital follow up with cellulitis and leg swelling. Last seen in September 2018. He has a  PMH of CAD s/p CABG (LIMA to LAD, SVG to PLA and SVG to PDA), HTN, HLD, chronic combined systolic and diastolic HF, IDDM, stage III CKD, gout, UTI, OSA on CPAP and dietary noncompliance. EF was 25-30% on echocardiogram in December 2013. Cardiac catheterization was on 09/04/2012 which showed diffuse 50% mid left main stenosis into 75% distal left main, diffuse 50% left circumflex stenosis, mid RCA was totally occluded, widely patent SVG to PDA, patent SVG to PLA with mild in-stent restenosis in the proximal aspect of the graft, patent LIMA to LAD. Echocardiogram obtained on 01/10/2014 showed EF has improved to 35-40%, moderate LVH, mild MR, PA peak pressure 50 mmHg. He was  admitted with chest pain on 05/21/2017. Serial troponin was borderline. He eventually underwent Myoview on 05/22/2017 which showed a small area of questionable ischemia versus attenuation artifact but overall felt to be low risk. He was able to ambulate without recurrent symptom limitation. Therefore he was discharged on the same day on 30 mg Imdur, lisinopril was discontinued. He continued to have symptoms so underwent cardiac cath that showed occlusion of the RCA and LAD but all grafts were patent.   He was admitted in January 2019 with gallstone pancreatitis treated with ERCP.   He was admitted in March 2021 with Cellulitis of the right foot with osteomyelitis. Angiogram showed severe anterior tibial disease with occlusion of the peroneal and PT vessels. Treated medically. He did have AKI with creatinine up to 2.86 before  declining to 2.17 with IV hydration and holding diuretics. Post DC he noted significant weight gain and edema and lasix was resumed. His weight increased to 240 lbs. He took lasix 80 mg bid for 3 days and is now back on 40 mg bid. Breathing is much better but still has some SOB. Weight has come down but he feels like he still has 10 extra pound of fluid. He has a PICC line in place now and still on antibiotics.   He was seen on 01/17/20. Labs revealed Hgb 11.1. creatinine improved to 1.64. BNP 624. Echo was updated as noted below and was really unchanged from prior. He has seen Dr Oneida Alar and follow up  MRI showed persistent osteomyelitis of the fifth metatarsal.   On his last visit we increased lasix to 80 mg bid. He has noted some improvement in his LE edema but still notes increased abdominal girth and SOB.  Thinks dry weight around 210 lbs. Did have a CXR at the [place] last week and was told there was "fluid around the heart".     Past Medical History:  Diagnosis Date  . Acute biliary pancreatitis 10/2017   with choledocholithiasis.  ERCP, stone extraction performed  . Arthritis   . CAD (coronary artery disease)    a. 1997 CABGx3 (VG->RPDA, VG->PLV, LIMA->LAD);  b. 07/2004 PCI VG->PLV (3.5x16 Taxus DES, 3.5x12 Taxus DES); c. 11/2009 PCI: VG->PLV 3.5x12 Promus DES), VG->RPDA (3.5x15 Promus DES); d. 08/2012 Cath: patent grafts; e. 05/2013 Lexiscan MV:  no ischemia/infarction, EF 44%.  . Cancer [facility]) 2005   colon surgery done  . Cataract   . Chronic combined systolic and diastolic CHF (congestive heart failure) ([place]) 08/2012   a. EF 50-55% 11/2011; b. 08/2012 - EF 25-30%,  with mild RM, mod TR, mod RAE & LAE, moderate reduced RV systolic function and PASP of 11mHg. - > cath showed patent grafts & Mod Puml HTN wth elevated PCWP;  c. 01/2014 Echo: EF 35-40%, mod LVH, mod HK, mildly dil LA, mod dil RA, PASP 547mg.  . CKD (chronic kidney disease), stage III   . Dyslipidemia   . GERD (gastroesophageal  reflux disease)   . Gout   . History of home oxygen therapy    uses oxygen with cpap low level not sure how many liters  . Hyperkalemia   . Hypertension   . Insulin Dependent Diabetes mellitus    type 2  . Myocardial [place]  . Sleep apnea   . Thrombocytopenia ([facility]   dates back to 2011  . UTI (lower urinary tract infection) 08/2012   E.coli    Past Surgical History:  Procedure Laterality Date  . ABDOMINAL AORTOGRAM W/LOWER EXTREMITY Right 01/04/2020   Abdominal aortogram with right lower extremity runoff  . ABDOMINAL AORTOGRAM W/LOWER EXTREMITY Bilateral 01/04/2020   Procedure: ABDOMINAL AORTOGRAM W/LOWER EXTREMITY;  Surgeon: FiElam DutchMD;  [facility];  Service: Vascular;  Laterality: Bilateral;  . APPENDECTOMY    . BACK SURGERY     15 years ago lower  . CARDIAC CATHETERIZATION  2005   stenting of the vein graft to the posterior lateral per -- Dr. JoMartinique    . CARDIAC CATHETERIZATION  2011   showed right coronary, totally occluded LAD and severe stenosis in both saphenous vein graft to the posterior lateral and posterior descending  -- stenting of the proximal portion of the SVG to the posterior lateral branch in 2/11  . CHOLECYSTECTOMY    . CORONARY ARTERY BYPASS GRAFT  1997    (LIMA to LAD, SVG to PDA, SVG to PL)  . ERCP N/A 10/17/2017    PeIrene ShipperMD;  [facility]; choledocholithiasis, biliary pancreatitis.  s/p sphincterotomy and stone extraction.    . ESOPHAGOGASTRODUODENOSCOPY (EGD) WITH PROPOFOL N/A 09/21/2017   Procedure: ESOPHAGOGASTRODUODENOSCOPY (EGD) WITH PROPOFOL;  Surgeon: DaDoran StablerMD;  Location: [facility];  Service: Gastroenterology;  Laterality: N/A;  . EYE SURGERY     ioc for cataract  . HERNIA REPAIR    . LEFT AND RIGHT HEART CATHETERIZATION WITH CORONARY/GRAFT ANGIOGRAM  09/04/2012   Procedure: LEFT AND RIGHT HEART CATHETERIZATION WITH COBeatrix Fetters Surgeon: MiSherren MochaMD;   [facility];  patent grafts, mod 2ndary pulmonary HTN, elevated LV EDP & PCWP  . LEFT HEART CATH AND CORS/GRAFTS ANGIOGRAPHY N/A 06/17/2017   Procedure: LEFT HEART CATH AND CORS/GRAFTS ANGIOGRAPHY;  Surgeon: JoMartiniquePeter M, MD;  [facility];  Service: Cardiovascular;  Laterality: N/A;  . PARTIAL COLECTOMY     cancerous polyps  . stent to heart  last done 5 to 6 yrs ago   x 4    Current Medications: Outpatient Medications Prior to Visit  Medication Sig Dispense Refill  . allopurinol (ZYLOPRIM) 100 MG tablet Take 1 tablet (100 mg total) by mouth daily. 60 tablet 1  . ALPRAZolam (XANAX) 0.25 MG tablet Take 1 tablet at bedtime as needed for sleep 30 tablet  0  . aspirin 81 MG EC tablet Take 81 mg by mouth at bedtime.     Marland Kitchen atorvastatin (LIPITOR) 80 MG tablet Take 80 mg by mouth daily.     Marland Kitchen b complex vitamins tablet Take 1 tablet by mouth daily.    Marland Kitchen bismuth subsalicylate (PEPTO BISMOL) 262 MG/15ML suspension Take 30 mLs by mouth every 6 (six) hours as needed for indigestion.    . carvedilol (COREG) 12.5 MG tablet Take 1 tablet (12.5 mg total) by mouth 2 (two) times daily with a meal. 60 tablet 6  . Cholecalciferol (VITAMIN D-3) 25 MCG (1000 UT) CAPS Take 1,000 Units by mouth every other day.    . colchicine 0.6 MG tablet Take 1 tablet (0.6 mg total) by mouth daily as needed. 60 tablet 0  . fluticasone (FLONASE) 50 MCG/ACT nasal spray Place 2 sprays into both nostrils daily as needed for allergies or rhinitis.    . furosemide (LASIX) 40 MG tablet Take 2 tablets (80 mg total) by mouth 2 (two) times daily. 180 tablet 3  . gabapentin (NEURONTIN) 300 MG capsule Take 300 mg by mouth at bedtime.     . insulin aspart protamine- aspart (NOVOLOG MIX 70/30) (70-30) 100 UNIT/ML injection Inject 35 Units into the skin 2 (two) times daily before a meal.     . latanoprost (XALATAN) 0.005 % ophthalmic solution Place 1 drop into both eyes at bedtime.    Marland Kitchen loratadine (CLARITIN) 10 MG  tablet Take 10 mg by mouth daily as needed (for seasonal allergies).     . losartan (COZAAR) 100 MG tablet Take 100 mg by mouth daily.    . Multiple Vitamin (MULTIVITAMIN) tablet Take 1 tablet by mouth daily.      . nitroGLYCERIN (NITROSTAT) 0.4 MG SL tablet Place 1 tablet (0.4 mg total) under the tongue every 5 (five) minutes as needed for chest pain (up to 3 doses). 25 tablet 3  . pantoprazole (PROTONIX) 40 MG tablet Take 1 tablet (40 mg total) by mouth 2 (two) times daily. (Patient taking differently: Take 40 mg by mouth daily. ) 180 tablet 3  . PRESCRIPTION MEDICATION CPAP: At bedtime    . tamsulosin (FLOMAX) 0.4 MG CAPS capsule Take 0.4 mg by mouth every evening.     . vancomycin (VANCOCIN) 10 G SOLR injection     . vancomycin IVPB Inject 750 mg into the vein daily. Indication:  Cellultitis/osteomyelitis  Last Day of Therapy:  02/08/20 Labs - Sunday/Monday:  CBC/D, BMP, and vancomycin trough. Labs - Thursday:  BMP and vancomycin trough Labs - Every other week:  ESR and CRP 30 Units 0  . Zinc 50 MG TABS      No facility-administered medications prior to visit.     Allergies:   Patient has no known allergies.   Social History   Socioeconomic History  . Marital status: Single    Spouse name: Not on file  . Number of children: 0  . Years of education: Not on file  . Highest education level: Not on file  Occupational History  . Occupation: retired   ran a Office manager course  Tobacco Use  . Smoking status: Former Smoker    Packs/day: 1.50    Years: 35.00    Pack years: 52.50    Types: Cigarettes    Quit date: 10/12/1983    Years since quitting: 36.3  . Smokeless tobacco: Never Used  Substance and Sexual Activity  . Alcohol use: Yes    Alcohol/week:  0.0 standard drinks    Comment: 2-3 drinks 3-4x/wk.  . Drug use: No  . Sexual activity: Not Currently    Birth control/protection: None  Other Topics Concern  . Not on file  Social History Narrative   Lives in [place] by himself.  Active  but does not routinely exercise.   Social Determinants of Health   Financial Resource Strain:   . Difficulty of Paying Living Expenses:   Food Insecurity:   . Worried About Charity fundraiser in the Last Year:   . Arboriculturist in the Last Year:   Transportation Needs:   . Film/video editor (Medical):   Marland Kitchen Lack of Transportation (Non-Medical):   Physical Activity:   . Days of Exercise per Week:   . Minutes of Exercise per Session:   Stress:   . Feeling of Stress :   Social Connections:   . Frequency of Communication with Friends and Family:   . Frequency of Social Gatherings with Friends and Family:   . Attends Religious Services:   . Active Member of Clubs or Organizations:   . Attends Archivist Meetings:   Marland Kitchen Marital Status:      Family History:  The patient's family history includes Asthma in his brother; Cardiomyopathy in his mother; Coronary artery disease in his brother and father; Heart attack in his father; Kidney disease in his sister.   ROS:   Please see the history of present illness.    ROS All other systems reviewed and are negative.   PHYSICAL EXAM:   VS:  BP 122/60 (BP Location: Left Arm, Patient Position: Sitting, Cuff Size: Normal)   Pulse 64   Temp (!) 97.5 F (36.4 C)   Ht 6' (1.829 m)   Wt 222 lb (100.7 kg)   BMI 30.11 kg/m    GEN: Well nourished, chronically ill appearing, in no acute distress  HEENT: normal  Neck: no JVD, carotid bruits, or masses Cardiac: RRR; no murmurs, rubs, or gallops, 1+ pitting edema  Respiratory:  clear to auscultation bilaterally, normal work of breathing GI: soft,  distended with old hernia, + BS MS: no deformity or atrophy  Skin: warm and dry, no rash Neuro:  Alert and Oriented x 3, Strength and sensation are intact Psych: euthymic mood, full affect  Wt Readings from Last 3 Encounters:  02/18/20 222 lb (100.7 kg)  02/14/20 226 lb (102.5 kg)  02/06/20 226 lb (102.5 kg)      Studies/Labs  Reviewed:   EKG:  EKG is not ordered today.     Recent Labs: 01/03/2020: ALT 19 01/17/2020: BNP 624.8; BUN 27; Creatinine, Ser 1.64; Hemoglobin 11.1; Platelets 147; Potassium 5.1; Sodium 136   Lipid Panel    Component Value Date/Time   CHOL 107 05/22/2017 0359   TRIG 212 (H) 05/22/2017 0359   HDL 36 (L) 05/22/2017 0359   CHOLHDL 3.0 05/22/2017 0359   VLDL 42 (H) 05/22/2017 0359   LDLCALC 29 05/22/2017 0359   LDLDIRECT 29.8 08/06/2013 0849   Dated 01/2920: BUN 48, creatinine 2.15. potassium 5.5.   Additional studies/ records that were reviewed today include:   Cath 09/04/2012 Procedural Findings: Hemodynamics RA 13 RV 69/15 PA 64/21 mean 37 PCWP a wave 21 v wave 36, mean 21 LV 143/22 AO 143/60 mean 93  Oxygen saturations: PA 72 AO 91  Cardiac Output (Fick) 9.2  Cardiac Index (Fick) 4.3           Coronary angiography: Coronary  dominance: right  Left mainstem: Left main is long. There is diffuse 50% mid left main stenosis into 75% distal left main.  Left anterior descending (LAD): The LAD is totally occluded in its proximal aspect.  Left circumflex (LCx): The left circumflex is patent there is diffuse 50% proximal stenosis. There are 2 patent obtuse marginal branches.  Right coronary artery (RCA): The RCA is heavily calcified and severely diseased. The mid vessel is totally occluded.  Saphenous vein graft to PDA: Widely patent with patent stents in the proximal body of the graft.  Saphenous vein graft to PLA is patent there is mild in-stent restenosis in the proximal aspect of the graft but there is no significant disease noted. The PLA is a large branch with no significant stenosis  LIMA to LAD: Widely patent with no significant stenosis. The native vessel territory is more consistent with the diagonal territory than a true LAD and I suspect the native LAD is an anatomic variant the doesn't reach the left ventricular apex.  Left ventriculography:  Deferred because of chronic kidney disease  Final Conclusions:   1. Severe three-vessel native coronary artery disease 2. Continued patency of the saphenous vein graft to PDA, saphenous vein graft to PLA, and LIMA to LAD 3. Moderate pulmonary hypertension likely related to left heart disease 4. Elevated left ventricular and pulmonary wedge pressure with large V waves  Recommendations: Continued medical therapy for treatment of congestive heart failure.    Echo 01/10/2014 - Left ventricle: The cavity size was moderately dilated. Wall thickness was increased in a pattern of moderate LVH. There was focal basal hypertrophy. Systolic function was moderately reduced. The estimated ejection fraction was in the range of 35% to 40%. Moderate hypokinesis. - Mitral valve: Mild regurgitation. - Left atrium: The atrium was mildly dilated. - Right atrium: The atrium was mildly to moderately dilated. - Pulmonary arteries: Systolic pressure was moderately increased. PA peak pressure: 59m Hg (S).   Cardiac cath 06/17/17:  LEFT HEART CATH AND CORS/GRAFTS ANGIOGRAPHY  Conclusion    Prox RCA to Dist RCA lesion, 100 %stenosed.  Prox LAD lesion, 100 %stenosed.  LIMA graft was visualized by angiography and is large and anatomically normal.  SVG graft was visualized by angiography and is normal in caliber.  Origin to Prox Graft lesion, 0 %stenosed.  SVG graft was visualized by angiography and is normal in caliber.  Origin lesion, 30 %stenosed.  Origin to Prox Graft lesion, 0 %stenosed.  There is moderate to severe left ventricular systolic dysfunction.  LV end diastolic pressure is normal.   1. Severe 2 vessel occlusive CAD.    - 100% proximal LAD    - 100% mid RCA 2. Patent LIMA to the LAD 3. Patent SVG to PDA 4. Patent SVG to PLOM 5. Moderate to severe LV dysfunction. EF estimated at 35%. 6. Normal LVEDP.  Plan: continue medical therapy. Consider GI referral for  evaluation of his pain.    Echo 02/04/20: IMPRESSIONS    1. Left ventricular ejection fraction, by estimation, is 35%%. The left  ventricle has severely decreased function. The left ventricle demonstrates  global hypokinesis. The left ventricular internal cavity size was severely  dilated. Left ventricular  diastolic parameters are indeterminate.  2. Right ventricular systolic function is mildly reduced. The right  ventricular size is mildly enlarged. There is moderately elevated  pulmonary artery systolic pressure.  3. Left atrial size was mild to moderately dilated.  4. Right atrial size was moderately dilated.  5.  The mitral valve is abnormal. Mild to moderate mitral valve  regurgitation.  6. The aortic valve is abnormal. Aortic valve regurgitation is mild. Mild  aortic valve sclerosis is present, with no evidence of aortic valve  stenosis.  7. Aortic dilatation noted. There is mild dilatation of the ascending  aorta measuring 41 mm.  8. The inferior vena cava is dilated in size with <50% respiratory  variability, suggesting right atrial pressure of 15 mmHg.   Comparison(s): The left ventricular function is unchanged.   ASSESSMENT:    No diagnosis found.   PLAN:    1.CAD s/p CABG: Negative Myoview on 05/22/2017. Cardiac cath in 2018 showed patent grafts. He is asymptomatic. Continue ASA, statin, Coreg.   2. Acute on chronic combined systolic/diastolic CHF. He is still  volume overloaded. Since his last visit weight is down 4 lbs and LE edema is better but he is still volume overloaded. Echo is stable with poor LV function. Will continue lasix at 80 mg bid. On Coreg and losartan. Will add metolazone 2.5 mg twice a week on Mon and Thurs. Plan on follow up in 3 weeks. Will need to monitor renal function closely.    3. Hyperlipidemia: On Lipitor 80 mg daily.  4. IDDM: On insulin. Per primary care.  5. HTN controlled.  6. CKD stage IV.   7.  OSA on CPAP: continue  current therapy  8. Osteomyelitis right foot on IV antibiotics. Severe below the knee PAD. Followed by Dr Oneida Alar. Surgery next week.  9. History of gout. On allopurinol.  Follow up in 3 weeks    Signed, Avaiyah Strubel Martinique, MD  02/18/2020 2:24 PM    [facility] [address], [place], [place]  [address] Phone: [phone]; Fax: [phone]

## 2020-02-13 ENCOUNTER — Telehealth: Payer: Self-pay

## 2020-02-13 NOTE — Telephone Encounter (Addendum)
Scott [name] is aware.  Thurston Hole., LPN  ----- Message from Elam Dutch, MD sent at 02/13/2020  1:03 PM EDT ----- Regarding: RE: Home Health Order Contact: [phone] Dc  ----- Message ----- From: Kaleen Mask, LPN Sent: 02/15/8468  62:95 AM EDT To: Elam Dutch, MD Subject: Home Health Order                              Hey Dr. Oneida Alar.  [facility] says pt finished his antibiotics and would like an order to d/c is picc line.  Please advise.  Thanks,  Thurston Hole., LPN

## 2020-02-14 ENCOUNTER — Encounter: Payer: Self-pay | Admitting: Cardiology

## 2020-02-14 ENCOUNTER — Other Ambulatory Visit: Payer: Self-pay

## 2020-02-14 ENCOUNTER — Encounter: Payer: Self-pay | Admitting: Vascular Surgery

## 2020-02-14 ENCOUNTER — Ambulatory Visit (INDEPENDENT_AMBULATORY_CARE_PROVIDER_SITE_OTHER): Payer: Medicare HMO | Admitting: Vascular Surgery

## 2020-02-14 VITALS — BP 136/80 | HR 76 | Temp 97.6°F | Resp 20 | Ht 72.0 in | Wt 226.0 lb

## 2020-02-14 DIAGNOSIS — E1151 Type 2 diabetes mellitus with diabetic peripheral angiopathy without gangrene: Secondary | ICD-10-CM | POA: Diagnosis not present

## 2020-02-14 DIAGNOSIS — N183 Chronic kidney disease, stage 3 unspecified: Secondary | ICD-10-CM | POA: Diagnosis not present

## 2020-02-14 DIAGNOSIS — M86171 Other acute osteomyelitis, right ankle and foot: Secondary | ICD-10-CM | POA: Diagnosis not present

## 2020-02-14 DIAGNOSIS — E1122 Type 2 diabetes mellitus with diabetic chronic kidney disease: Secondary | ICD-10-CM | POA: Diagnosis not present

## 2020-02-14 DIAGNOSIS — I5042 Chronic combined systolic (congestive) and diastolic (congestive) heart failure: Secondary | ICD-10-CM | POA: Diagnosis not present

## 2020-02-14 DIAGNOSIS — Z452 Encounter for adjustment and management of vascular access device: Secondary | ICD-10-CM | POA: Diagnosis not present

## 2020-02-14 DIAGNOSIS — I130 Hypertensive heart and chronic kidney disease with heart failure and stage 1 through stage 4 chronic kidney disease, or unspecified chronic kidney disease: Secondary | ICD-10-CM | POA: Diagnosis not present

## 2020-02-14 DIAGNOSIS — M869 Osteomyelitis, unspecified: Secondary | ICD-10-CM | POA: Diagnosis not present

## 2020-02-14 DIAGNOSIS — L97512 Non-pressure chronic ulcer of other part of right foot with fat layer exposed: Secondary | ICD-10-CM | POA: Diagnosis not present

## 2020-02-14 DIAGNOSIS — L03115 Cellulitis of right lower limb: Secondary | ICD-10-CM | POA: Diagnosis not present

## 2020-02-14 NOTE — Progress Notes (Signed)
Patient is an 86 year old male who returns for follow-up today.  He recently had an MRI scan to further evaluate his right foot.  He has now had a full 6-week course of IV antibiotics.  He reports no drainage.  However the foot is still tender to palpation.  Review of systems: He has had no fever or chills.  Current Outpatient Medications on File Prior to Visit  Medication Sig Dispense Refill  . allopurinol (ZYLOPRIM) 100 MG tablet Take 1 tablet (100 mg total) by mouth daily. 60 tablet 1  . ALPRAZolam (XANAX) 0.25 MG tablet Take 1 tablet at bedtime as needed for sleep 30 tablet 0  . aspirin 81 MG EC tablet Take 81 mg by mouth at bedtime.     Marland Kitchen atorvastatin (LIPITOR) 80 MG tablet Take 80 mg by mouth daily.     Marland Kitchen b complex vitamins tablet Take 1 tablet by mouth daily.    Marland Kitchen bismuth subsalicylate (PEPTO BISMOL) 262 MG/15ML suspension Take 30 mLs by mouth every 6 (six) hours as needed for indigestion.    . carvedilol (COREG) 12.5 MG tablet Take 1 tablet (12.5 mg total) by mouth 2 (two) times daily with a meal. 60 tablet 6  . Cholecalciferol (VITAMIN D-3) 25 MCG (1000 UT) CAPS Take 1,000 Units by mouth every other day.    . colchicine 0.6 MG tablet Take 1 tablet (0.6 mg total) by mouth daily as needed. 60 tablet 0  . fluticasone (FLONASE) 50 MCG/ACT nasal spray Place 2 sprays into both nostrils daily as needed for allergies or rhinitis.    . furosemide (LASIX) 40 MG tablet Take 2 tablets (80 mg total) by mouth 2 (two) times daily. 180 tablet 3  . gabapentin (NEURONTIN) 300 MG capsule Take 300 mg by mouth at bedtime.     . insulin aspart protamine- aspart (NOVOLOG MIX 70/30) (70-30) 100 UNIT/ML injection Inject 35 Units into the skin 2 (two) times daily before a meal.     . latanoprost (XALATAN) 0.005 % ophthalmic solution Place 1 drop into both eyes at bedtime.    Marland Kitchen loratadine (CLARITIN) 10 MG tablet Take 10 mg by mouth daily as needed (for seasonal allergies).     . losartan (COZAAR) 100 MG tablet  Take 100 mg by mouth daily.    . Multiple Vitamin (MULTIVITAMIN) tablet Take 1 tablet by mouth daily.      . nitroGLYCERIN (NITROSTAT) 0.4 MG SL tablet Place 1 tablet (0.4 mg total) under the tongue every 5 (five) minutes as needed for chest pain (up to 3 doses). 25 tablet 3  . pantoprazole (PROTONIX) 40 MG tablet Take 1 tablet (40 mg total) by mouth 2 (two) times daily. (Patient taking differently: Take 40 mg by mouth daily. ) 180 tablet 3  . PRESCRIPTION MEDICATION CPAP: At bedtime    . tamsulosin (FLOMAX) 0.4 MG CAPS capsule Take 0.4 mg by mouth every evening.     . Zinc 50 MG TABS     . vancomycin (VANCOCIN) 10 G SOLR injection     . vancomycin IVPB Inject 750 mg into the vein daily. Indication:  Cellultitis/osteomyelitis  Last Day of Therapy:  02/08/20 Labs - Sunday/Monday:  CBC/D, BMP, and vancomycin trough. Labs - Thursday:  BMP and vancomycin trough Labs - Every other week:  ESR and CRP (Patient not taking: Reported on 02/14/2020) 30 Units 0   No current facility-administered medications on file prior to visit.    Past Medical History:  Diagnosis Date  .  Acute biliary pancreatitis 10/2017   with choledocholithiasis.  ERCP, stone extraction performed  . Arthritis   . CAD (coronary artery disease)    a. 1997 CABGx3 (VG->RPDA, VG->PLV, LIMA->LAD);  b. 07/2004 PCI VG->PLV (3.5x16 Taxus DES, 3.5x12 Taxus DES); c. 11/2009 PCI: VG->PLV 3.5x12 Promus DES), VG->RPDA (3.5x15 Promus DES); d. 08/2012 Cath: patent grafts; e. 05/2013 Lexiscan MV: no ischemia/infarction, EF 44%.  . Cancer [facility]) 2005   colon surgery done  . Cataract   . Chronic combined systolic and diastolic CHF (congestive heart failure) ([facility]) 08/2012   a. EF 50-55% 11/2011; b. 08/2012 - EF 25-30%,  with mild RM, mod TR, mod RAE & LAE, moderate reduced RV systolic function and PASP of 78mHg. - > cath showed patent grafts & Mod Puml HTN wth elevated PCWP;  c. 01/2014 Echo: EF 35-40%, mod LVH, mod HK, mildly dil LA, mod dil RA, PASP  549mg.  . CKD (chronic kidney disease), stage III   . Dyslipidemia   . GERD (gastroesophageal reflux disease)   . Gout   . History of home oxygen therapy    uses oxygen with cpap low level not sure how many liters  . Hyperkalemia   . Hypertension   . Insulin Dependent Diabetes mellitus    type 2  . Myocardial infarction ([facility]1997  . Sleep apnea   . Thrombocytopenia ([facility]2011   dates back to 2011  . UTI (lower urinary tract infection) 08/2012   E.coli    Past Surgical History:  Procedure Laterality Date  . ABDOMINAL AORTOGRAM W/LOWER EXTREMITY Right 01/04/2020   Abdominal aortogram with right lower extremity runoff  . ABDOMINAL AORTOGRAM W/LOWER EXTREMITY Bilateral 01/04/2020   Procedure: ABDOMINAL AORTOGRAM W/LOWER EXTREMITY;  Surgeon: FiElam DutchMD;  [facility];  Service: Vascular;  Laterality: Bilateral;  . APPENDECTOMY    . BACK SURGERY     15 years ago lower  . CARDIAC CATHETERIZATION  2005   stenting of the vein graft to the posterior lateral per -- Dr. JoMartinique    . CARDIAC CATHETERIZATION  2011   showed right coronary, totally occluded LAD and severe stenosis in both saphenous vein graft to the posterior lateral and posterior descending  -- stenting of the proximal portion of the SVG to the posterior lateral branch in 2/11  . CHOLECYSTECTOMY    . CORONARY ARTERY BYPASS GRAFT  1997    (LIMA to LAD, SVG to PDA, SVG to PL)  . ERCP N/A 10/17/2017    PeIrene ShipperMD;  [facility]; choledocholithiasis, biliary pancreatitis.  s/p sphincterotomy and stone extraction.    . ESOPHAGOGASTRODUODENOSCOPY (EGD) WITH PROPOFOL N/A 09/21/2017   Procedure: ESOPHAGOGASTRODUODENOSCOPY (EGD) WITH PROPOFOL;  Surgeon: DaDoran StablerMD;  [facility]: [facility];  Service: Gastroenterology;  Laterality: N/A;  . EYE SURGERY     ioc for cataract  . HERNIA REPAIR    . LEFT AND RIGHT HEART CATHETERIZATION WITH CORONARY/GRAFT ANGIOGRAM  09/04/2012   Procedure:  LEFT AND RIGHT HEART CATHETERIZATION WITH COBeatrix Fetters Surgeon: MiSherren MochaMD;  [facility];  patent grafts, mod 2ndary pulmonary HTN, elevated LV EDP & PCWP  . LEFT HEART CATH AND CORS/GRAFTS ANGIOGRAPHY N/A 06/17/2017   Procedure: LEFT HEART CATH AND CORS/GRAFTS ANGIOGRAPHY;  Surgeon: JoMartiniquePeter M, MD;  [facility];  Service: Cardiovascular;  Laterality: N/A;  . PARTIAL COLECTOMY     cancerous polyps  . stent to heart  last done 5 to 6 yrs ago   x 4    Physical exam:  Vitals:   02/14/20 1446  BP: 136/80  Pulse: 76  Resp: 20  Temp: 97.6 F (36.4 C)  SpO2: 94%  Weight: 226 lb (102.5 kg)  Height: 6' (1.829 m)    Right lower extremity: No palpable pedal pulses mild erythema over the right fifth metatarsal head laterally no drainage no fluctuance slightly tender to palpation  Data: I reviewed the patient's recent arteriogram there is a very short segment less than 1 cm length occlusion of the proximal anterior tibial artery but otherwise the runoff is intact of the right foot so I believe he should have adequate perfusion to heal 1/5 ray amputation  I reviewed his MRI scan which shows bony destruction of the fifth metatarsal head and active ongoing acute osteomyelitis  Assessment: Osteomyelitis right fifth metatarsal which is now failed conservative measures with IV antibiotics.  He should have adequate perfusion for wound healing although I did discuss with him today that there is a possibility this may not heal and he could be faced with a below-knee amputation.  Plan: Right fifth ray amputation scheduled for Monday, Feb 25, 2020.  We will admit the patient for 23-hour observation postop.  [name], MD [facility] Office: [phone]

## 2020-02-14 NOTE — H&P (View-Only) (Signed)
Patient is an 86 year old male who returns for follow-up today.  He recently had an MRI scan to further evaluate his right foot.  He has now had a full 6-week course of IV antibiotics.  He reports no drainage.  However the foot is still tender to palpation.  Review of systems: He has had no fever or chills.  Current Outpatient Medications on File Prior to Visit  Medication Sig Dispense Refill  . allopurinol (ZYLOPRIM) 100 MG tablet Take 1 tablet (100 mg total) by mouth daily. 60 tablet 1  . ALPRAZolam (XANAX) 0.25 MG tablet Take 1 tablet at bedtime as needed for sleep 30 tablet 0  . aspirin 81 MG EC tablet Take 81 mg by mouth at bedtime.     Marland Kitchen atorvastatin (LIPITOR) 80 MG tablet Take 80 mg by mouth daily.     Marland Kitchen b complex vitamins tablet Take 1 tablet by mouth daily.    Marland Kitchen bismuth subsalicylate (PEPTO BISMOL) 262 MG/15ML suspension Take 30 mLs by mouth every 6 (six) hours as needed for indigestion.    . carvedilol (COREG) 12.5 MG tablet Take 1 tablet (12.5 mg total) by mouth 2 (two) times daily with a meal. 60 tablet 6  . Cholecalciferol (VITAMIN D-3) 25 MCG (1000 UT) CAPS Take 1,000 Units by mouth every other day.    . colchicine 0.6 MG tablet Take 1 tablet (0.6 mg total) by mouth daily as needed. 60 tablet 0  . fluticasone (FLONASE) 50 MCG/ACT nasal spray Place 2 sprays into both nostrils daily as needed for allergies or rhinitis.    . furosemide (LASIX) 40 MG tablet Take 2 tablets (80 mg total) by mouth 2 (two) times daily. 180 tablet 3  . gabapentin (NEURONTIN) 300 MG capsule Take 300 mg by mouth at bedtime.     . insulin aspart protamine- aspart (NOVOLOG MIX 70/30) (70-30) 100 UNIT/ML injection Inject 35 Units into the skin 2 (two) times daily before a meal.     . latanoprost (XALATAN) 0.005 % ophthalmic solution Place 1 drop into both eyes at bedtime.    Marland Kitchen loratadine (CLARITIN) 10 MG tablet Take 10 mg by mouth daily as needed (for seasonal allergies).     . losartan (COZAAR) 100 MG tablet  Take 100 mg by mouth daily.    . Multiple Vitamin (MULTIVITAMIN) tablet Take 1 tablet by mouth daily.      . nitroGLYCERIN (NITROSTAT) 0.4 MG SL tablet Place 1 tablet (0.4 mg total) under the tongue every 5 (five) minutes as needed for chest pain (up to 3 doses). 25 tablet 3  . pantoprazole (PROTONIX) 40 MG tablet Take 1 tablet (40 mg total) by mouth 2 (two) times daily. (Patient taking differently: Take 40 mg by mouth daily. ) 180 tablet 3  . PRESCRIPTION MEDICATION CPAP: At bedtime    . tamsulosin (FLOMAX) 0.4 MG CAPS capsule Take 0.4 mg by mouth every evening.     . Zinc 50 MG TABS     . vancomycin (VANCOCIN) 10 G SOLR injection     . vancomycin IVPB Inject 750 mg into the vein daily. Indication:  Cellultitis/osteomyelitis  Last Day of Therapy:  02/08/20 Labs - Sunday/Monday:  CBC/D, BMP, and vancomycin trough. Labs - Thursday:  BMP and vancomycin trough Labs - Every other week:  ESR and CRP (Patient not taking: Reported on 02/14/2020) 30 Units 0   No current facility-administered medications on file prior to visit.    Past Medical History:  Diagnosis Date  .  Acute biliary pancreatitis 10/2017   with choledocholithiasis.  ERCP, stone extraction performed  . Arthritis   . CAD (coronary artery disease)    a. 1997 CABGx3 (VG->RPDA, VG->PLV, LIMA->LAD);  b. 07/2004 PCI VG->PLV (3.5x16 Taxus DES, 3.5x12 Taxus DES); c. 11/2009 PCI: VG->PLV 3.5x12 Promus DES), VG->RPDA (3.5x15 Promus DES); d. 08/2012 Cath: patent grafts; e. 05/2013 Lexiscan MV: no ischemia/infarction, EF 44%.  . Cancer [facility]) 2005   colon surgery done  . Cataract   . Chronic combined systolic and diastolic CHF (congestive heart failure) ([facility]) 08/2012   a. EF 50-55% 11/2011; b. 08/2012 - EF 25-30%,  with mild RM, mod TR, mod RAE & LAE, moderate reduced RV systolic function and PASP of 78mHg. - > cath showed patent grafts & Mod Puml HTN wth elevated PCWP;  c. 01/2014 Echo: EF 35-40%, mod LVH, mod HK, mildly dil LA, mod dil RA, PASP  549mg.  . CKD (chronic kidney disease), stage III   . Dyslipidemia   . GERD (gastroesophageal reflux disease)   . Gout   . History of home oxygen therapy    uses oxygen with cpap low level not sure how many liters  . Hyperkalemia   . Hypertension   . Insulin Dependent Diabetes mellitus    type 2  . Myocardial infarction ([facility]1997  . Sleep apnea   . Thrombocytopenia ([facility]ane2011   dates back to 2011  . UTI (lower urinary tract infection) 08/2012   E.coli    Past Surgical History:  Procedure Laterality Date  . ABDOMINAL AORTOGRAM W/LOWER EXTREMITY Right 01/04/2020   Abdominal aortogram with right lower extremity runoff  . ABDOMINAL AORTOGRAM W/LOWER EXTREMITY Bilateral 01/04/2020   Procedure: ABDOMINAL AORTOGRAM W/LOWER EXTREMITY;  Surgeon: FiElam DutchMD;  [facility];  Service: Vascular;  Laterality: Bilateral;  . APPENDECTOMY    . BACK SURGERY     15 years ago lower  . CARDIAC CATHETERIZATION  2005   stenting of the vein graft to the posterior lateral per -- Dr. JoMartinique    . CARDIAC CATHETERIZATION  2011   showed right coronary, totally occluded LAD and severe stenosis in both saphenous vein graft to the posterior lateral and posterior descending  -- stenting of the proximal portion of the SVG to the posterior lateral branch in 2/11  . CHOLECYSTECTOMY    . CORONARY ARTERY BYPASS GRAFT  1997    (LIMA to LAD, SVG to PDA, SVG to PL)  . ERCP N/A 10/17/2017    PeIrene ShipperMD;  [facility]; choledocholithiasis, biliary pancreatitis.  s/p sphincterotomy and stone extraction.    . ESOPHAGOGASTRODUODENOSCOPY (EGD) WITH PROPOFOL N/A 09/21/2017   Procedure: ESOPHAGOGASTRODUODENOSCOPY (EGD) WITH PROPOFOL;  Surgeon: DaDoran StablerMD;  [facility];  Service: Gastroenterology;  Laterality: N/A;  . EYE SURGERY     ioc for cataract  . HERNIA REPAIR    . LEFT AND RIGHT HEART CATHETERIZATION WITH CORONARY/GRAFT ANGIOGRAM  09/04/2012   Procedure:  LEFT AND RIGHT HEART CATHETERIZATION WITH COBeatrix Fetters Surgeon: MiSherren MochaMD;  [facility];  patent grafts, mod 2ndary pulmonary HTN, elevated LV EDP & PCWP  . LEFT HEART CATH AND CORS/GRAFTS ANGIOGRAPHY N/A 06/17/2017   Procedure: LEFT HEART CATH AND CORS/GRAFTS ANGIOGRAPHY;  Surgeon: JoMartiniquePeter M, MD;  [facility];  Service: Cardiovascular;  Laterality: N/A;  . PARTIAL COLECTOMY     cancerous polyps  . stent to heart  last done 5 to 6 yrs ago   x 4    Physical exam:  Vitals:   02/14/20 1446  BP: 136/80  Pulse: 76  Resp: 20  Temp: 97.6 F (36.4 C)  SpO2: 94%  Weight: 226 lb (102.5 kg)  Height: 6' (1.829 m)    Right lower extremity: No palpable pedal pulses mild erythema over the right fifth metatarsal head laterally no drainage no fluctuance slightly tender to palpation  Data: I reviewed the patient's recent arteriogram there is a very short segment less than 1 cm length occlusion of the proximal anterior tibial artery but otherwise the runoff is intact of the right foot so I believe he should have adequate perfusion to heal 1/5 ray amputation  I reviewed his MRI scan which shows bony destruction of the fifth metatarsal head and active ongoing acute osteomyelitis  Assessment: Osteomyelitis right fifth metatarsal which is now failed conservative measures with IV antibiotics.  He should have adequate perfusion for wound healing although I did discuss with him today that there is a possibility this may not heal and he could be faced with a below-knee amputation.  Plan: Right fifth ray amputation scheduled for Monday, Feb 25, 2020.  We will admit the patient for 23-hour observation postop.  Charles Fields, MD [facility] [place] Office: [phone]  

## 2020-02-15 ENCOUNTER — Other Ambulatory Visit: Payer: Self-pay

## 2020-02-17 DIAGNOSIS — R062 Wheezing: Secondary | ICD-10-CM | POA: Diagnosis not present

## 2020-02-17 DIAGNOSIS — I509 Heart failure, unspecified: Secondary | ICD-10-CM | POA: Diagnosis not present

## 2020-02-17 DIAGNOSIS — I259 Chronic ischemic heart disease, unspecified: Secondary | ICD-10-CM | POA: Diagnosis not present

## 2020-02-17 DIAGNOSIS — I2510 Atherosclerotic heart disease of native coronary artery without angina pectoris: Secondary | ICD-10-CM | POA: Diagnosis not present

## 2020-02-18 ENCOUNTER — Telehealth: Payer: Self-pay | Admitting: Cardiology

## 2020-02-18 ENCOUNTER — Ambulatory Visit: Payer: Medicare HMO | Admitting: Cardiology

## 2020-02-18 ENCOUNTER — Encounter: Payer: Self-pay | Admitting: Cardiology

## 2020-02-18 ENCOUNTER — Other Ambulatory Visit: Payer: Self-pay

## 2020-02-18 ENCOUNTER — Other Ambulatory Visit: Payer: Self-pay | Admitting: Cardiology

## 2020-02-18 VITALS — BP 122/60 | HR 64 | Temp 97.5°F | Ht 72.0 in | Wt 222.0 lb

## 2020-02-18 DIAGNOSIS — I5043 Acute on chronic combined systolic (congestive) and diastolic (congestive) heart failure: Secondary | ICD-10-CM | POA: Diagnosis not present

## 2020-02-18 DIAGNOSIS — I25708 Atherosclerosis of coronary artery bypass graft(s), unspecified, with other forms of angina pectoris: Secondary | ICD-10-CM

## 2020-02-18 DIAGNOSIS — N184 Chronic kidney disease, stage 4 (severe): Secondary | ICD-10-CM | POA: Diagnosis not present

## 2020-02-18 DIAGNOSIS — E7800 Pure hypercholesterolemia, unspecified: Secondary | ICD-10-CM | POA: Diagnosis not present

## 2020-02-18 DIAGNOSIS — L089 Local infection of the skin and subcutaneous tissue, unspecified: Secondary | ICD-10-CM | POA: Diagnosis not present

## 2020-02-18 DIAGNOSIS — Z794 Long term (current) use of insulin: Secondary | ICD-10-CM | POA: Diagnosis not present

## 2020-02-18 DIAGNOSIS — E875 Hyperkalemia: Secondary | ICD-10-CM

## 2020-02-18 DIAGNOSIS — E11628 Type 2 diabetes mellitus with other skin complications: Secondary | ICD-10-CM

## 2020-02-18 DIAGNOSIS — E118 Type 2 diabetes mellitus with unspecified complications: Secondary | ICD-10-CM | POA: Diagnosis not present

## 2020-02-18 MED ORDER — METOLAZONE 2.5 MG PO TABS
2.5000 mg | ORAL_TABLET | Freq: Every day | ORAL | 3 refills | Status: DC
Start: 2020-02-18 — End: 2020-09-30

## 2020-02-18 NOTE — Patient Instructions (Addendum)
Add metolazone 2.5 mg twice a week on Monday and Thursday  Continue your other therapy  Follow up in 3 weeks.

## 2020-02-18 NOTE — Telephone Encounter (Signed)
    Received called from [employer] about critical K+ at 5.8. Appears that the patients K+ typically runs on the higher side of normal. He is not on supplemental potassium. Attempted to call patient to discuss and have him come back to the office tomorrow for re-drawn however there was no answer on either cell phone or home phone number. Will route to NL Triage. If unable to contact this evening, Triage can you please call the patient in the morning and have him come for re-draw of lab work? I will place order now.    Thank you  Kathyrn Drown NP-C [facility] Pager: [phone]

## 2020-02-19 DIAGNOSIS — E875 Hyperkalemia: Secondary | ICD-10-CM | POA: Diagnosis not present

## 2020-02-19 LAB — BASIC METABOLIC PANEL
BUN/Creatinine Ratio: 30 — ABNORMAL HIGH (ref 10–24)
BUN: 67 mg/dL — ABNORMAL HIGH (ref 8–27)
CO2: 23 mmol/L (ref 20–29)
Calcium: 9.2 mg/dL (ref 8.6–10.2)
Chloride: 99 mmol/L (ref 96–106)
Creatinine, Ser: 2.24 mg/dL — ABNORMAL HIGH (ref 0.76–1.27)
GFR calc Af Amer: 30 mL/min/{1.73_m2} — ABNORMAL LOW (ref >59)
GFR calc non Af Amer: 26 mL/min/{1.73_m2} — ABNORMAL LOW (ref >59)
Glucose: 167 mg/dL — ABNORMAL HIGH (ref 65–99)
Potassium: 5.8 mmol/L (ref 3.5–5.2)
Sodium: 135 mmol/L (ref 134–144)

## 2020-02-19 LAB — BRAIN NATRIURETIC PEPTIDE: BNP: 1303.1 pg/mL — ABNORMAL HIGH (ref 0.0–100.0)

## 2020-02-19 NOTE — Addendum Note (Signed)
Addended by: Sherrie Mustache on: 02/19/2020 08:29 AM   Modules accepted: Orders

## 2020-02-19 NOTE — Telephone Encounter (Signed)
Spoke with patient about critical K+ level. Patient advised to come in for repeat lab draw today. Patient verbalized understanding. Will route to primary nurse Malachy Mood and Dr. Martinique so that they are aware and can be on the lookout for lab results.

## 2020-02-20 ENCOUNTER — Other Ambulatory Visit: Payer: Self-pay | Admitting: Cardiology

## 2020-02-20 ENCOUNTER — Other Ambulatory Visit: Payer: Self-pay

## 2020-02-20 ENCOUNTER — Telehealth: Payer: Self-pay | Admitting: Internal Medicine

## 2020-02-20 DIAGNOSIS — E875 Hyperkalemia: Secondary | ICD-10-CM

## 2020-02-20 LAB — BASIC METABOLIC PANEL
BUN/Creatinine Ratio: 31 — ABNORMAL HIGH (ref 10–24)
BUN: 66 mg/dL — ABNORMAL HIGH (ref 8–27)
CO2: 22 mmol/L (ref 20–29)
Calcium: 9.3 mg/dL (ref 8.6–10.2)
Chloride: 99 mmol/L (ref 96–106)
Creatinine, Ser: 2.14 mg/dL — ABNORMAL HIGH (ref 0.76–1.27)
GFR calc Af Amer: 31 mL/min/{1.73_m2} — ABNORMAL LOW (ref >59)
GFR calc non Af Amer: 27 mL/min/{1.73_m2} — ABNORMAL LOW (ref >59)
Glucose: 103 mg/dL — ABNORMAL HIGH (ref 65–99)
Potassium: 6.1 mmol/L (ref 3.5–5.2)
Sodium: 137 mmol/L (ref 134–144)

## 2020-02-20 NOTE — Telephone Encounter (Signed)
Spoke to patient repeat potassium still elevated 6.1.Spoke to [employer] pharmacist she advised to take [facility] one dose today and one dose tomorrow.Samples will be left at [place] office front desk.Advised to repeat bmet Friday 02/22/20.Order placed.

## 2020-02-20 NOTE — Addendum Note (Signed)
Addended by: Kathyrn Lass on: 02/20/2020 01:41 PM   Modules accepted: Orders

## 2020-02-20 NOTE — Telephone Encounter (Signed)
Received a call from [name] regarding critical potassium of 6.1. On review of his records, he appears to have chronic hyperkalemia. I have called [name] multiple times without answer. His voicemail box is not set up. Will let Dr. Martinique know about the lab results.  Scott Quan, MD [facility]

## 2020-02-21 ENCOUNTER — Other Ambulatory Visit (HOSPITAL_COMMUNITY)
Admission: RE | Admit: 2020-02-21 | Discharge: 2020-02-21 | Disposition: A | Discharge status: Home / Self Care | Payer: Medicare HMO | Source: Ambulatory Visit | Attending: Vascular Surgery | Admitting: Vascular Surgery

## 2020-02-21 DIAGNOSIS — Z01812 Encounter for preprocedural laboratory examination: Secondary | ICD-10-CM | POA: Insufficient documentation

## 2020-02-21 DIAGNOSIS — Z20822 Contact with and (suspected) exposure to covid-19: Secondary | ICD-10-CM | POA: Insufficient documentation

## 2020-02-21 LAB — SARS CORONAVIRUS 2 (TAT 6-24 HRS): SARS Coronavirus 2: NEGATIVE

## 2020-02-22 ENCOUNTER — Encounter (HOSPITAL_COMMUNITY): Payer: Self-pay | Admitting: Vascular Surgery

## 2020-02-22 DIAGNOSIS — E875 Hyperkalemia: Secondary | ICD-10-CM | POA: Diagnosis not present

## 2020-02-22 NOTE — Anesthesia Preprocedure Evaluation (Addendum)
Anesthesia Evaluation  Patient identified by MRN, date of birth, ID band Patient awake    Reviewed: Allergy & Precautions, NPO status , Patient's Chart, lab work & pertinent test results  Airway Mallampati: II  TM Distance: >3 FB Neck ROM: Full    Dental  (+) Edentulous Upper, Edentulous Lower   Pulmonary former smoker,    breath sounds clear to auscultation       Cardiovascular hypertension,  Rhythm:Regular Rate:Normal     Neuro/Psych    GI/Hepatic   Endo/Other  diabetes  Renal/GU      Musculoskeletal   Abdominal   Peds  Hematology   Anesthesia Other Findings   Reproductive/Obstetrics                            Anesthesia Physical Anesthesia Plan  ASA: III  Anesthesia Plan: MAC   Post-op Pain Management:  Regional for Post-op pain   Induction:   PONV Risk Score and Plan: Ondansetron and Propofol infusion  Airway Management Planned: Natural Airway and Simple Face Mask  Additional Equipment:   Intra-op Plan:   Post-operative Plan:   Informed Consent: I have reviewed the patients History and Physical, chart, labs and discussed the procedure including the risks, benefits and alternatives for the proposed anesthesia with the patient or authorized representative who has indicated his/her understanding and acceptance.       Plan Discussed with: CRNA and Anesthesiologist  Anesthesia Plan Comments: (PAT note by Karoline Caldwell, PA-C: He was admitted in March 2021 with Cellulitis of the right foot with osteomyelitis. Angiogram showed severe anterior tibial disease with occlusion of the peroneal and PT vessels. Treated medically. He did have AKI with creatinine up to 2.86 before declining to 2.17 with IV hydration and holding diuretics. Post DC he noted significant weight gain and edema and lasix was resumed. His weight increased to 240 lbs. He took lasix 80 mg bid for 3 days and is now  back on 40 mg bid. Breathing is much better but still has some SOB. Weight has come down but he feels like he still has 10 extra pound of fluid. He has a PICC line in place now and still on antibiotics.   Follows with cardiology for hx of CAD s/p CABG (cath in 2018 showed patent grafts.), HTN, HLD, chronic combined systolic and diastolic HF. Last seen by Dr. Martinique 02/18/20 for hospital followup. Per note, "He is still  volume overloaded. Since his last visit weight is down 4 lbs and LE edema is better but he is still volume overloaded. Echo is stable with poor LV function. Will continue lasix at 80 mg bid. On Coreg and losartan. Will add metolazone 2.5 mg twice a week on Mon and Thurs. Plan on follow up in 3 weeks. Will need to monitor renal function closely.    OSA on CPAP and O2 QHS.  Hx of hyperkalemia. Last labs 02/19/20 with K+ 6.1. Per notes in Epic this is being followed by cardiology who have reached out to patient. Will need recheck DOS. Downsloping ST segments in lateral leads unchanged from prior.   CKD III, last creatinine 2.14 on 02/19/20. Baseline ~ 2.0.  IDDMII, last A1c 7.6 on 01/03/20:  Will need DOS labs and eval.   EKG4/8/21: Sinus rhythm with 1st degree AV block. Low voltage QRS. Incomplete LBBB. ST & T   TTE 02/04/20: 1. Left ventricular ejection fraction, by estimation, is 35%%. The left  ventricle  has severely decreased function. The left ventricle demonstrates  global hypokinesis. The left ventricular internal cavity size was severely  dilated. Left ventricular  diastolic parameters are indeterminate.  2. Right ventricular systolic function is mildly reduced. The right  ventricular size is mildly enlarged. There is moderately elevated  pulmonary artery systolic pressure.  3. Left atrial size was mild to moderately dilated.  4. Right atrial size was moderately dilated.  5. The mitral valve is abnormal. Mild to moderate mitral valve  regurgitation.  6. The aortic  valve is abnormal. Aortic valve regurgitation is mild. Mild  aortic valve sclerosis is present, with no evidence of aortic valve  stenosis.  7. Aortic dilatation noted. There is mild dilatation of the ascending  aorta measuring 41 mm.  8. The inferior vena cava is dilated in size with <50% respiratory  variability, suggesting right atrial pressure of 15 mmHg.  Cath 06/17/17: 1. Severe 2 vessel occlusive CAD.    - 100% proximal LAD    - 100% mid RCA 2. Patent LIMA to the LAD 3. Patent SVG to PDA 4. Patent SVG to PLOM 5. Moderate to severe LV dysfunction. EF estimated at 35%. 6. Normal LVEDP.  Plan: continue medical therapy. Consider GI referral for evaluation of his pain. )       Anesthesia Quick Evaluation

## 2020-02-22 NOTE — Progress Notes (Signed)
Patient denies shortness of breath, fever, cough and chest pain.  PCP - Dr Reubin Milan Cardiologist - Dr Martinique  Chest x-ray - n/a EKG - 01/17/20 Stress Test - 05/22/17 ECHO - 02/04/20 Cardiac Cath - 06/17/17  Sleep Study - Yes CPAP - wears cpap nightly - (Bring)  Fasting Blood Sugar - 120-140s Checks Blood Sugar  2-3  Times/wk  . THE NIGHT BEFORE SURGERY, take 14 units of Novolog 70/30 mix Insulin.     . If your blood sugar is less than 70 mg/dL, you will need to treat for low blood sugar: o Treat a low blood sugar (less than 70 mg/dL) with  cup of clear juice (cranberry or apple), 4 glucose tablets, OR glucose gel. o Recheck blood sugar in 15 minutes after treatment (to make sure it is greater than 70 mg/dL). If your blood sugar is not greater than 70 mg/dL on recheck, call [phone] for further instructions.  Aspirin Instructions: Ok to take on DOS per Kia at MD's office.  Anesthesia review:  Yes  STOP now taking any Aspirin (unless otherwise instructed by your surgeon), Aleve, Naproxen, Ibuprofen, Motrin, Advil, Goody's, BC's, all herbal medications, fish oil, and all vitamins.   Coronavirus Screening Covid test on 02/21/20 was negative.  Patient verbalized understanding of instructions that were given via phone.

## 2020-02-22 NOTE — Progress Notes (Signed)
Anesthesia Chart Review: Same day workup  He was admitted in March 2021 with Cellulitis of the right foot with osteomyelitis. Angiogram showed severe anterior tibial disease with occlusion of the peroneal and PT vessels. Treated medically. He did have AKI with creatinine up to 2.86 before declining to 2.17 with IV hydration and holding diuretics. Post DC he noted significant weight gain and edema and lasix was resumed. His weight increased to 240 lbs. He took lasix 80 mg bid for 3 days and is now back on 40 mg bid. Breathing is much better but still has some SOB. Weight has come down but he feels like he still has 10 extra pound of fluid. He has a PICC line in place now and still on antibiotics.   Follows with cardiology for hx of CAD s/p CABG (cath in 2018 showed patent grafts.), HTN, HLD, chronic combined systolic and diastolic HF. Last seen by Dr. Martinique 02/18/20 for hospital followup. Per note, "He is still  volume overloaded. Since his last visit weight is down 4 lbs and LE edema is better but he is still volume overloaded. Echo is stable with poor LV function. Will continue lasix at 80 mg bid. On Coreg and losartan. Will add metolazone 2.5 mg twice a week on Mon and Thurs. Plan on follow up in 3 weeks. Will need to monitor renal function closely.    OSA on CPAP and O2 QHS.  Hx of hyperkalemia. Last labs 02/19/20 with K+ 6.1. Per notes in Epic this is being followed by cardiology who have reached out to patient. Will need recheck DOS. Downsloping ST segments in lateral leads unchanged from prior.   CKD III, last creatinine 2.14 on 02/19/20. Baseline ~ 2.0.  IDDMII, last A1c 7.6 on 01/03/20:  Will need DOS labs and eval.   EKG4/8/21: Sinus rhythm with 1st degree AV block. Low voltage QRS. Incomplete LBBB. ST & T   TTE 02/04/20: 1. Left ventricular ejection fraction, by estimation, is 35%%. The left  ventricle has severely decreased function. The left ventricle demonstrates  global hypokinesis.  The left ventricular internal cavity size was severely  dilated. Left ventricular  diastolic parameters are indeterminate.  2. Right ventricular systolic function is mildly reduced. The right  ventricular size is mildly enlarged. There is moderately elevated  pulmonary artery systolic pressure.  3. Left atrial size was mild to moderately dilated.  4. Right atrial size was moderately dilated.  5. The mitral valve is abnormal. Mild to moderate mitral valve  regurgitation.  6. The aortic valve is abnormal. Aortic valve regurgitation is mild. Mild  aortic valve sclerosis is present, with no evidence of aortic valve  stenosis.  7. Aortic dilatation noted. There is mild dilatation of the ascending  aorta measuring 41 mm.  8. The inferior vena cava is dilated in size with <50% respiratory  variability, suggesting right atrial pressure of 15 mmHg.  Cath 06/17/17: 1. Severe 2 vessel occlusive CAD.    - 100% proximal LAD    - 100% mid RCA 2. Patent LIMA to the LAD 3. Patent SVG to PDA 4. Patent SVG to PLOM 5. Moderate to severe LV dysfunction. EF estimated at 35%. 6. Normal LVEDP.  Plan: continue medical therapy. Consider GI referral for evaluation of his pain.   [facility] Stay [facility]/Anesthesiology Phone [phone] 02/22/2020 1:17 PM

## 2020-02-23 LAB — BASIC METABOLIC PANEL
BUN/Creatinine Ratio: 33 — ABNORMAL HIGH (ref 10–24)
BUN: 77 mg/dL (ref 8–27)
CO2: 24 mmol/L (ref 20–29)
Calcium: 9.2 mg/dL (ref 8.6–10.2)
Chloride: 95 mmol/L — ABNORMAL LOW (ref 96–106)
Creatinine, Ser: 2.32 mg/dL — ABNORMAL HIGH (ref 0.76–1.27)
GFR calc Af Amer: 29 mL/min/{1.73_m2} — ABNORMAL LOW (ref >59)
GFR calc non Af Amer: 25 mL/min/{1.73_m2} — ABNORMAL LOW (ref >59)
Glucose: 219 mg/dL — ABNORMAL HIGH (ref 65–99)
Potassium: 5 mmol/L (ref 3.5–5.2)
Sodium: 136 mmol/L (ref 134–144)

## 2020-02-25 ENCOUNTER — Inpatient Hospital Stay (HOSPITAL_COMMUNITY): Payer: Medicare HMO | Admitting: Physician Assistant

## 2020-02-25 ENCOUNTER — Encounter (HOSPITAL_COMMUNITY)
Admission: RE | Disposition: A | Discharge status: Home / Self Care | Payer: Self-pay | Source: Home / Self Care | Attending: Vascular Surgery

## 2020-02-25 ENCOUNTER — Other Ambulatory Visit: Payer: Self-pay

## 2020-02-25 ENCOUNTER — Encounter (HOSPITAL_COMMUNITY): Payer: Self-pay | Admitting: Vascular Surgery

## 2020-02-25 ENCOUNTER — Observation Stay (HOSPITAL_COMMUNITY)
Admission: RE | Admit: 2020-02-25 | Discharge: 2020-02-26 | Disposition: A | Discharge status: Home / Self Care | Payer: Medicare HMO | Attending: Vascular Surgery | Admitting: Vascular Surgery

## 2020-02-25 DIAGNOSIS — Z955 Presence of coronary angioplasty implant and graft: Secondary | ICD-10-CM | POA: Diagnosis not present

## 2020-02-25 DIAGNOSIS — Z87891 Personal history of nicotine dependence: Secondary | ICD-10-CM | POA: Diagnosis not present

## 2020-02-25 DIAGNOSIS — E1136 Type 2 diabetes mellitus with diabetic cataract: Secondary | ICD-10-CM | POA: Insufficient documentation

## 2020-02-25 DIAGNOSIS — E1122 Type 2 diabetes mellitus with diabetic chronic kidney disease: Secondary | ICD-10-CM | POA: Diagnosis not present

## 2020-02-25 DIAGNOSIS — M1990 Unspecified osteoarthritis, unspecified site: Secondary | ICD-10-CM | POA: Insufficient documentation

## 2020-02-25 DIAGNOSIS — E1169 Type 2 diabetes mellitus with other specified complication: Principal | ICD-10-CM | POA: Insufficient documentation

## 2020-02-25 DIAGNOSIS — I2510 Atherosclerotic heart disease of native coronary artery without angina pectoris: Secondary | ICD-10-CM | POA: Insufficient documentation

## 2020-02-25 DIAGNOSIS — Z9981 Dependence on supplemental oxygen: Secondary | ICD-10-CM | POA: Diagnosis not present

## 2020-02-25 DIAGNOSIS — Z794 Long term (current) use of insulin: Secondary | ICD-10-CM | POA: Insufficient documentation

## 2020-02-25 DIAGNOSIS — E785 Hyperlipidemia, unspecified: Secondary | ICD-10-CM | POA: Insufficient documentation

## 2020-02-25 DIAGNOSIS — Z951 Presence of aortocoronary bypass graft: Secondary | ICD-10-CM | POA: Insufficient documentation

## 2020-02-25 DIAGNOSIS — I252 Old myocardial infarction: Secondary | ICD-10-CM | POA: Diagnosis not present

## 2020-02-25 DIAGNOSIS — Z79899 Other long term (current) drug therapy: Secondary | ICD-10-CM | POA: Diagnosis not present

## 2020-02-25 DIAGNOSIS — N183 Chronic kidney disease, stage 3 unspecified: Secondary | ICD-10-CM | POA: Insufficient documentation

## 2020-02-25 DIAGNOSIS — I5042 Chronic combined systolic (congestive) and diastolic (congestive) heart failure: Secondary | ICD-10-CM | POA: Insufficient documentation

## 2020-02-25 DIAGNOSIS — K219 Gastro-esophageal reflux disease without esophagitis: Secondary | ICD-10-CM | POA: Insufficient documentation

## 2020-02-25 DIAGNOSIS — M86171 Other acute osteomyelitis, right ankle and foot: Secondary | ICD-10-CM | POA: Insufficient documentation

## 2020-02-25 DIAGNOSIS — Z7982 Long term (current) use of aspirin: Secondary | ICD-10-CM | POA: Diagnosis not present

## 2020-02-25 DIAGNOSIS — Z89429 Acquired absence of other toe(s), unspecified side: Secondary | ICD-10-CM | POA: Insufficient documentation

## 2020-02-25 DIAGNOSIS — M86671 Other chronic osteomyelitis, right ankle and foot: Secondary | ICD-10-CM

## 2020-02-25 DIAGNOSIS — G4730 Sleep apnea, unspecified: Secondary | ICD-10-CM | POA: Insufficient documentation

## 2020-02-25 DIAGNOSIS — E1151 Type 2 diabetes mellitus with diabetic peripheral angiopathy without gangrene: Secondary | ICD-10-CM | POA: Diagnosis not present

## 2020-02-25 DIAGNOSIS — M869 Osteomyelitis, unspecified: Secondary | ICD-10-CM | POA: Diagnosis present

## 2020-02-25 DIAGNOSIS — D696 Thrombocytopenia, unspecified: Secondary | ICD-10-CM | POA: Diagnosis not present

## 2020-02-25 DIAGNOSIS — M109 Gout, unspecified: Secondary | ICD-10-CM | POA: Insufficient documentation

## 2020-02-25 DIAGNOSIS — I130 Hypertensive heart and chronic kidney disease with heart failure and stage 1 through stage 4 chronic kidney disease, or unspecified chronic kidney disease: Secondary | ICD-10-CM | POA: Diagnosis not present

## 2020-02-25 HISTORY — PX: AMPUTATION: SHX166

## 2020-02-25 HISTORY — DX: Peripheral vascular disease, unspecified: I73.9

## 2020-02-25 LAB — CBC
HCT: 40.1 % (ref 39.0–52.0)
Hemoglobin: 12.2 g/dL — ABNORMAL LOW (ref 13.0–17.0)
MCH: 28.8 pg (ref 26.0–34.0)
MCHC: 30.4 g/dL (ref 30.0–36.0)
MCV: 94.8 fL (ref 80.0–100.0)
Platelets: 89 10*3/uL — ABNORMAL LOW (ref 150–400)
RBC: 4.23 MIL/uL (ref 4.22–5.81)
RDW: 16.8 % — ABNORMAL HIGH (ref 11.5–15.5)
WBC: 5.9 10*3/uL (ref 4.0–10.5)
nRBC: 0 % (ref 0.0–0.2)

## 2020-02-25 LAB — POCT I-STAT, CHEM 8
BUN: 70 mg/dL — ABNORMAL HIGH (ref 8–23)
Calcium, Ion: 1.11 mmol/L — ABNORMAL LOW (ref 1.15–1.40)
Chloride: 99 mmol/L (ref 98–111)
Creatinine, Ser: 2.6 mg/dL — ABNORMAL HIGH (ref 0.61–1.24)
Glucose, Bld: 114 mg/dL — ABNORMAL HIGH (ref 70–99)
HCT: 42 % (ref 39.0–52.0)
Hemoglobin: 14.3 g/dL (ref 13.0–17.0)
Potassium: 4.3 mmol/L (ref 3.5–5.1)
Sodium: 138 mmol/L (ref 135–145)
TCO2: 29 mmol/L (ref 22–32)

## 2020-02-25 LAB — GLUCOSE, CAPILLARY
Glucose-Capillary: 122 mg/dL — ABNORMAL HIGH (ref 70–99)
Glucose-Capillary: 126 mg/dL — ABNORMAL HIGH (ref 70–99)
Glucose-Capillary: 114 mg/dL — ABNORMAL HIGH (ref 70–99)
Glucose-Capillary: 209 mg/dL — ABNORMAL HIGH (ref 70–99)

## 2020-02-25 LAB — CREATININE, SERUM
Creatinine, Ser: 2.48 mg/dL — ABNORMAL HIGH (ref 0.61–1.24)
GFR calc Af Amer: 26 mL/min — ABNORMAL LOW (ref >60)
GFR calc non Af Amer: 23 mL/min — ABNORMAL LOW (ref >60)

## 2020-02-25 SURGERY — AMPUTATION DIGIT
Anesthesia: Monitor Anesthesia Care | Site: Foot | Laterality: Right

## 2020-02-25 MED ORDER — INSULIN ASPART 100 UNIT/ML ~~LOC~~ SOLN: 0.0000 [IU] | Freq: Three times a day (TID) | SUBCUTANEOUS | Status: DC

## 2020-02-25 MED ORDER — FERROUS SULFATE 325 (65 FE) MG PO TABS
325.0000 mg | ORAL_TABLET | ORAL | Status: DC
  Administered 2020-02-26: 325 mg via ORAL
  Filled 2020-02-25: qty 1

## 2020-02-25 MED ORDER — ONDANSETRON HCL 4 MG/2ML IJ SOLN
INTRAMUSCULAR | Status: DC | PRN
  Administered 2020-02-25: 4 mg via INTRAVENOUS

## 2020-02-25 MED ORDER — ACETAMINOPHEN 325 MG RE SUPP
325.0000 mg | RECTAL | Status: DC | PRN
  Filled 2020-02-25: qty 2

## 2020-02-25 MED ORDER — CEFAZOLIN SODIUM-DEXTROSE 2-4 GM/100ML-% IV SOLN: 2.0000 g | Freq: Three times a day (TID) | INTRAVENOUS | Status: DC

## 2020-02-25 MED ORDER — ALPRAZOLAM 0.25 MG PO TABS
0.1250 mg | ORAL_TABLET | Freq: Every evening | ORAL | Status: DC | PRN
  Administered 2020-02-25: 0.25 mg via ORAL
  Filled 2020-02-25: qty 1

## 2020-02-25 MED ORDER — HYDRALAZINE HCL 20 MG/ML IJ SOLN: 5.0000 mg | INTRAMUSCULAR | Status: DC | PRN

## 2020-02-25 MED ORDER — COLCHICINE 0.6 MG PO TABS
0.6000 mg | ORAL_TABLET | Freq: Every day | ORAL | Status: DC
  Administered 2020-02-26: 0.6 mg via ORAL
  Filled 2020-02-25: qty 1

## 2020-02-25 MED ORDER — ADULT MULTIVITAMIN W/MINERALS CH
1.0000 | ORAL_TABLET | Freq: Every day | ORAL | Status: DC
  Administered 2020-02-26: 1 via ORAL
  Filled 2020-02-25: qty 1

## 2020-02-25 MED ORDER — LABETALOL HCL 5 MG/ML IV SOLN: 10.0000 mg | INTRAVENOUS | Status: DC | PRN

## 2020-02-25 MED ORDER — CARVEDILOL 12.5 MG PO TABS
12.5000 mg | ORAL_TABLET | Freq: Two times a day (BID) | ORAL | Status: DC
  Administered 2020-02-26: 12.5 mg via ORAL
  Filled 2020-02-25: qty 1

## 2020-02-25 MED ORDER — LOSARTAN POTASSIUM 50 MG PO TABS
100.0000 mg | ORAL_TABLET | Freq: Every day | ORAL | Status: DC
  Administered 2020-02-25 – 2020-02-26 (×2): 100 mg via ORAL
  Filled 2020-02-25: qty 2

## 2020-02-25 MED ORDER — GUAIFENESIN-DM 100-10 MG/5ML PO SYRP: 15.0000 mL | ORAL_SOLUTION | ORAL | Status: DC | PRN

## 2020-02-25 MED ORDER — ALLOPURINOL 100 MG PO TABS
100.0000 mg | ORAL_TABLET | Freq: Two times a day (BID) | ORAL | Status: DC
  Administered 2020-02-25 – 2020-02-26 (×2): 100 mg via ORAL
  Filled 2020-02-25 (×2): qty 1

## 2020-02-25 MED ORDER — PRESCRIPTION MEDICATION: Freq: Every day | Status: DC

## 2020-02-25 MED ORDER — LORATADINE 10 MG PO TABS: 10.0000 mg | ORAL_TABLET | Freq: Every day | ORAL | Status: DC | PRN

## 2020-02-25 MED ORDER — GABAPENTIN 300 MG PO CAPS
300.0000 mg | ORAL_CAPSULE | Freq: Every day | ORAL | Status: DC
  Administered 2020-02-25: 300 mg via ORAL
  Filled 2020-02-25: qty 1

## 2020-02-25 MED ORDER — CEFAZOLIN SODIUM-DEXTROSE 2-4 GM/100ML-% IV SOLN
2.0000 g | Freq: Two times a day (BID) | INTRAVENOUS | Status: AC
  Administered 2020-02-25 – 2020-02-26 (×2): 2 g via INTRAVENOUS
  Filled 2020-02-25 (×2): qty 100

## 2020-02-25 MED ORDER — SODIUM CHLORIDE 0.9% FLUSH
3.0000 mL | Freq: Two times a day (BID) | INTRAVENOUS | Status: DC
  Administered 2020-02-25 – 2020-02-26 (×2): 3 mL via INTRAVENOUS

## 2020-02-25 MED ORDER — ISOSORBIDE MONONITRATE ER 30 MG PO TB24
30.0000 mg | ORAL_TABLET | Freq: Every day | ORAL | Status: DC
  Administered 2020-02-26: 30 mg via ORAL
  Filled 2020-02-25: qty 1

## 2020-02-25 MED ORDER — PROPOFOL 500 MG/50ML IV EMUL
INTRAVENOUS | Status: DC | PRN
  Administered 2020-02-25: 100 ug/kg/min via INTRAVENOUS

## 2020-02-25 MED ORDER — ISOSORBIDE DINITRATE 30 MG PO TABS: 30.0000 mg | ORAL_TABLET | Freq: Every day | ORAL | Status: DC

## 2020-02-25 MED ORDER — BISMUTH SUBSALICYLATE 262 MG/15ML PO SUSP
30.0000 mL | Freq: Four times a day (QID) | ORAL | Status: DC | PRN
  Filled 2020-02-25: qty 236

## 2020-02-25 MED ORDER — OXYCODONE HCL 5 MG PO TABS
5.0000 mg | ORAL_TABLET | ORAL | Status: DC | PRN
  Administered 2020-02-26: 10 mg via ORAL
  Filled 2020-02-25 (×2): qty 2

## 2020-02-25 MED ORDER — SODIUM CHLORIDE 0.9 % IV SOLN: 250.0000 mL | INTRAVENOUS | Status: DC | PRN

## 2020-02-25 MED ORDER — FENTANYL CITRATE (PF) 100 MCG/2ML IJ SOLN
INTRAMUSCULAR | Status: DC | PRN
  Administered 2020-02-25 (×2): 50 ug via INTRAVENOUS
  Administered 2020-02-25: 25 ug via INTRAVENOUS

## 2020-02-25 MED ORDER — FUROSEMIDE 80 MG PO TABS
80.0000 mg | ORAL_TABLET | Freq: Two times a day (BID) | ORAL | Status: DC
  Administered 2020-02-26: 80 mg via ORAL
  Filled 2020-02-25 (×2): qty 1

## 2020-02-25 MED ORDER — PHENOL 1.4 % MT LIQD: 1.0000 | OROMUCOSAL | Status: DC | PRN

## 2020-02-25 MED ORDER — ONDANSETRON HCL 4 MG/2ML IJ SOLN: 4.0000 mg | Freq: Once | INTRAMUSCULAR | Status: DC | PRN

## 2020-02-25 MED ORDER — PANTOPRAZOLE SODIUM 40 MG PO TBEC: 40.0000 mg | DELAYED_RELEASE_TABLET | Freq: Every day | ORAL | Status: DC

## 2020-02-25 MED ORDER — ACETAMINOPHEN 325 MG PO TABS: 325.0000 mg | ORAL_TABLET | ORAL | Status: DC | PRN

## 2020-02-25 MED ORDER — FENTANYL CITRATE (PF) 100 MCG/2ML IJ SOLN
25.0000 ug | INTRAMUSCULAR | Status: DC | PRN
  Administered 2020-02-25 (×2): 25 ug via INTRAVENOUS

## 2020-02-25 MED ORDER — ASPIRIN EC 81 MG PO TBEC
81.0000 mg | DELAYED_RELEASE_TABLET | Freq: Every day | ORAL | Status: DC
  Administered 2020-02-25: 81 mg via ORAL
  Filled 2020-02-25 (×2): qty 1

## 2020-02-25 MED ORDER — CEFAZOLIN SODIUM-DEXTROSE 2-4 GM/100ML-% IV SOLN
INTRAVENOUS | Status: AC
  Filled 2020-02-25: qty 100

## 2020-02-25 MED ORDER — LATANOPROST 0.005 % OP SOLN
1.0000 [drp] | Freq: Every day | OPHTHALMIC | Status: DC
  Administered 2020-02-25: 1 [drp] via OPHTHALMIC
  Filled 2020-02-25: qty 2.5

## 2020-02-25 MED ORDER — ALUM & MAG HYDROXIDE-SIMETH 200-200-20 MG/5ML PO SUSP: 15.0000 mL | ORAL | Status: DC | PRN

## 2020-02-25 MED ORDER — CHLORHEXIDINE GLUCONATE 4 % EX LIQD: 60.0000 mL | Freq: Once | CUTANEOUS | Status: DC

## 2020-02-25 MED ORDER — NITROGLYCERIN 0.4 MG SL SUBL: 0.4000 mg | SUBLINGUAL_TABLET | SUBLINGUAL | Status: DC | PRN

## 2020-02-25 MED ORDER — MORPHINE SULFATE (PF) 2 MG/ML IV SOLN: 2.0000 mg | INTRAVENOUS | Status: DC | PRN

## 2020-02-25 MED ORDER — ONDANSETRON HCL 4 MG/2ML IJ SOLN: 4.0000 mg | Freq: Four times a day (QID) | INTRAMUSCULAR | Status: DC | PRN

## 2020-02-25 MED ORDER — PANTOPRAZOLE SODIUM 40 MG PO TBEC
40.0000 mg | DELAYED_RELEASE_TABLET | Freq: Every day | ORAL | Status: DC
  Administered 2020-02-26: 40 mg via ORAL
  Filled 2020-02-25: qty 1

## 2020-02-25 MED ORDER — CEFAZOLIN SODIUM-DEXTROSE 2-4 GM/100ML-% IV SOLN
2.0000 g | INTRAVENOUS | Status: AC
  Administered 2020-02-25: 2 g via INTRAVENOUS

## 2020-02-25 MED ORDER — HEPARIN SODIUM (PORCINE) 5000 UNIT/ML IJ SOLN
5000.0000 [IU] | Freq: Three times a day (TID) | INTRAMUSCULAR | Status: DC
  Filled 2020-02-25: qty 1

## 2020-02-25 MED ORDER — DOCUSATE SODIUM 100 MG PO CAPS
100.0000 mg | ORAL_CAPSULE | Freq: Two times a day (BID) | ORAL | Status: DC
  Administered 2020-02-25 – 2020-02-26 (×2): 100 mg via ORAL
  Filled 2020-02-25 (×2): qty 1

## 2020-02-25 MED ORDER — COLCHICINE 0.6 MG PO TABS: 0.6000 mg | ORAL_TABLET | Freq: Every day | ORAL | Status: DC | PRN

## 2020-02-25 MED ORDER — SODIUM CHLORIDE 0.9 % IV SOLN: INTRAVENOUS | Status: DC

## 2020-02-25 MED ORDER — METOPROLOL TARTRATE 5 MG/5ML IV SOLN: 2.0000 mg | INTRAVENOUS | Status: DC | PRN

## 2020-02-25 MED ORDER — TAMSULOSIN HCL 0.4 MG PO CAPS: 0.4000 mg | ORAL_CAPSULE | Freq: Every evening | ORAL | Status: DC

## 2020-02-25 MED ORDER — INSULIN ASPART PROT & ASPART (70-30 MIX) 100 UNIT/ML ~~LOC~~ SUSP
20.0000 [IU] | Freq: Every day | SUBCUTANEOUS | Status: DC
  Filled 2020-02-25: qty 10

## 2020-02-25 MED ORDER — METOLAZONE 2.5 MG PO TABS
2.5000 mg | ORAL_TABLET | ORAL | Status: DC
  Filled 2020-02-25: qty 1

## 2020-02-25 MED ORDER — FENTANYL CITRATE (PF) 100 MCG/2ML IJ SOLN
INTRAMUSCULAR | Status: AC
  Filled 2020-02-25: qty 2

## 2020-02-25 MED ORDER — FENTANYL CITRATE (PF) 250 MCG/5ML IJ SOLN
INTRAMUSCULAR | Status: AC
  Filled 2020-02-25: qty 5

## 2020-02-25 MED ORDER — VITAMIN D 25 MCG (1000 UNIT) PO TABS: 2000.0000 [IU] | ORAL_TABLET | ORAL | Status: DC

## 2020-02-25 MED ORDER — FLUTICASONE PROPIONATE 50 MCG/ACT NA SUSP
2.0000 | Freq: Every day | NASAL | Status: DC | PRN
  Filled 2020-02-25: qty 16

## 2020-02-25 MED ORDER — SODIUM CHLORIDE 0.9% FLUSH: 3.0000 mL | INTRAVENOUS | Status: DC | PRN

## 2020-02-25 MED ORDER — ATORVASTATIN CALCIUM 80 MG PO TABS
80.0000 mg | ORAL_TABLET | Freq: Every day | ORAL | Status: DC
  Administered 2020-02-26: 80 mg via ORAL
  Filled 2020-02-25: qty 1

## 2020-02-25 SURGICAL SUPPLY — 27 items
BNDG ELASTIC 4X5.8 VLCR STR LF (GAUZE/BANDAGES/DRESSINGS) ×2 IMPLANT
BNDG GAUZE ELAST 4 BULKY (GAUZE/BANDAGES/DRESSINGS) ×2 IMPLANT
CANISTER SUCT 3000ML PPV (MISCELLANEOUS) ×2 IMPLANT
COVER SURGICAL LIGHT HANDLE (MISCELLANEOUS) ×2 IMPLANT
COVER WAND RF STERILE (DRAPES) ×2 IMPLANT
DRAPE EXTREMITY T 121X128X90 (DISPOSABLE) ×2 IMPLANT
DRAPE HALF SHEET 40X57 (DRAPES) ×2 IMPLANT
DRSG EMULSION OIL 3X3 NADH (GAUZE/BANDAGES/DRESSINGS) ×2 IMPLANT
ELECT REM PT RETURN 9FT ADLT (ELECTROSURGICAL) ×2
ELECTRODE REM PT RTRN 9FT ADLT (ELECTROSURGICAL) ×1 IMPLANT
GAUZE SPONGE 4X4 12PLY STRL (GAUZE/BANDAGES/DRESSINGS) ×2 IMPLANT
GLOVE BIO SURGEON STRL SZ7.5 (GLOVE) ×2 IMPLANT
GOWN STRL REUS W/ TWL LRG LVL3 (GOWN DISPOSABLE) ×3 IMPLANT
GOWN STRL REUS W/TWL LRG LVL3 (GOWN DISPOSABLE) ×6
KIT BASIN OR (CUSTOM PROCEDURE TRAY) ×2 IMPLANT
KIT TURNOVER KIT B (KITS) ×2 IMPLANT
NS IRRIG 1000ML POUR BTL (IV SOLUTION) ×2 IMPLANT
PACK GENERAL/GYN (CUSTOM PROCEDURE TRAY) ×2 IMPLANT
PAD ARMBOARD 7.5X6 YLW CONV (MISCELLANEOUS) ×4 IMPLANT
SPECIMEN JAR SMALL (MISCELLANEOUS) ×2 IMPLANT
SUT ETHILON 3 0 PS 1 (SUTURE) ×2 IMPLANT
SUT VIC AB 3-0 SH 27 (SUTURE)
SUT VIC AB 3-0 SH 27X BRD (SUTURE) IMPLANT
TOWEL GREEN STERILE (TOWEL DISPOSABLE) ×4 IMPLANT
TOWEL GREEN STERILE FF (TOWEL DISPOSABLE) ×2 IMPLANT
UNDERPAD 30X36 HEAVY ABSORB (UNDERPADS AND DIAPERS) ×2 IMPLANT
WATER STERILE IRR 1000ML POUR (IV SOLUTION) ×2 IMPLANT

## 2020-02-25 NOTE — Anesthesia Postprocedure Evaluation (Signed)
Anesthesia Post Note  Patient: Scott [name]  Procedure(s) Performed: RIGHT FIFTH TOE AMPUTATION (Right Foot)     Patient location during evaluation: PACU Anesthesia Type: MAC Level of consciousness: awake and alert Pain management: pain level controlled Vital Signs Assessment: post-procedure vital signs reviewed and stable Respiratory status: spontaneous breathing, nonlabored ventilation, respiratory function stable and patient connected to nasal cannula oxygen Cardiovascular status: stable and blood pressure returned to baseline Postop Assessment: no apparent nausea or vomiting Anesthetic complications: no    Last Vitals:  Vitals:   02/25/20 1650 02/25/20 1705  BP: 113/86 128/70  Pulse: (!) 58 (!) 56  Resp: 12 14  Temp: (!) 35.8 C (!) 35.9 C  SpO2: 91% 98%    Last Pain:  Vitals:   02/25/20 1716  TempSrc:   PainSc: 2                  Tinsleigh Slovacek COKER

## 2020-02-25 NOTE — Transfer of Care (Signed)
Immediate Anesthesia Transfer of Care Note  Patient: Scott [name]  Procedure(s) Performed: RIGHT FIFTH TOE AMPUTATION (Right Foot)  Patient Location: PACU  Anesthesia Type:MAC, regional   Level of Consciousness: awake, alert  and oriented  Airway & Oxygen Therapy: Patient Spontanous Breathing and Patient connected to face mask oxygen  Post-op Assessment: Report given to RN and Post -op Vital signs reviewed and stable  Post vital signs: Reviewed and stable  Last Vitals:  Vitals Value Taken Time  BP 112/62 02/25/20 1550  Temp    Pulse 62 02/25/20 1553  Resp 16 02/25/20 1553  SpO2 99 % 02/25/20 1553  Vitals shown include unvalidated device data.  Last Pain:  Vitals:   02/25/20 1303  TempSrc: Temporal  PainSc: 0-No pain      Patients Stated Pain Goal: 3 (97/35/32 9924)  Complications: No apparent anesthesia complications

## 2020-02-25 NOTE — Interval H&P Note (Signed)
History and Physical Interval Note:  02/25/2020 2:25 PM  Scott [name]  has presented today for surgery, with the diagnosis of PERIPHERAL ARTERY DISEASE.  The various methods of treatment have been discussed with the patient and family. After consideration of risks, benefits and other options for treatment, the patient has consented to  Procedure(s): RIGHT FIFTH TOE AMPUTATION (Right) as a surgical intervention.  The patient's history has been reviewed, patient examined, no change in status, stable for surgery.  I have reviewed the patient's chart and labs.  Questions were answered to the patient's satisfaction.     [name]

## 2020-02-25 NOTE — Op Note (Signed)
Procedure: Amputation right fifth toe with resection of metatarsal head  Preoperative diagnosis: Osteomyelitis right fifth metatarsal head  Postoperative diagnosis: Same  Anesthesia: Ankle block Specimens: Right fifth toe  Assistant: Nurse   Operative details: After obtaining informed consent, the patient was taken to the operating room. The patient was placed in supine position on the operating room table. After an ankle block by the anesthesia team, the patient's entire right foot was prepped and draped in usual sterile fashion. A circumferential incision was made at the base of the right fifth toe and extended on the lateral aspect of the foot to the level of the mid metatarsal. The incision was carried all the way down to the level of the bone. There was some bleeding from digital vessels. This was controlled with cautery. The bone was transected with a bone cutter in the midportion of the proximal phalanx. The remainder of the proximal phalanx was debrided away with rongeurs. The metatarsal head was removed with rongeurs. The wound was thoroughly irrigated with normal saline solution. Hemostasis was obtained. Skin edges were reapproximated using interrupted 3-0 mattress and simple nylon sutures. A dry sterile dressing was applied. The patient tolerated procedure well and there were no complications. Instrument sponge and needle counts were correct at the end of the case. The patient was taken to recovery in stable condition.   [name], MD  [facility] [place]  Office: [phone]  Pager: [phone]

## 2020-02-25 NOTE — Anesthesia Procedure Notes (Signed)
Procedure Name: MAC Date/Time: 02/25/2020 3:03 PM Performed by: Alain Marion, CRNA Pre-anesthesia Checklist: Patient identified, Emergency Drugs available, Suction available and Patient being monitored Oxygen Delivery Method: Simple face mask Placement Confirmation: positive ETCO2

## 2020-02-25 NOTE — Progress Notes (Signed)
PHARMACY NOTE:  ANTIMICROBIAL RENAL DOSAGE ADJUSTMENT  Current antimicrobial regimen includes a mismatch between antimicrobial dosage and estimated renal function.  As per policy approved by the Pharmacy & Therapeutics and Medical Executive Committees, the antimicrobial dosage will be adjusted accordingly.  Current antimicrobial dosage:  Cefazolin 2g q8 x3  Indication: Post surgical prophylaxis  Renal Function:  Estimated Creatinine Clearance: 25.9 mL/min (A) (by C-G formula based on SCr of 2.48 mg/dL (H)). []      On intermittent HD, scheduled: []      On CRRT    Antimicrobial dosage has been changed to:  Cefazolin 2g IV q12 x2  Additional comments:  Onnie Boer, PharmD, BCIDP, AAHIVP, CPP Infectious Disease Pharmacist 02/25/2020 7:14 PM

## 2020-02-25 NOTE — Anesthesia Procedure Notes (Signed)
Anesthesia Regional Block: Ankle block   Pre-Anesthetic Checklist: ,, timeout performed, Correct Patient, Correct Site, Correct Laterality, Correct Procedure, Correct Position, site marked, Risks and benefits discussed, Surgical consent,  Pre-op evaluation,  At surgeon's request  Laterality: Right  Prep: chloraprep       Needles:  Injection technique: Single-shot  Needle Type: Other      Needle Gauge: 22     Additional Needles:   Narrative:  Start time: 02/25/2020 2:25 PM End time: 02/25/2020 3:30 PM Injection made incrementally with aspirations every 5 mL.  Performed by: Personally   Additional Notes: 30 cc 25 lidocaine plain injected easily

## 2020-02-26 DIAGNOSIS — M1990 Unspecified osteoarthritis, unspecified site: Secondary | ICD-10-CM | POA: Diagnosis not present

## 2020-02-26 DIAGNOSIS — N183 Chronic kidney disease, stage 3 unspecified: Secondary | ICD-10-CM | POA: Diagnosis not present

## 2020-02-26 DIAGNOSIS — I130 Hypertensive heart and chronic kidney disease with heart failure and stage 1 through stage 4 chronic kidney disease, or unspecified chronic kidney disease: Secondary | ICD-10-CM | POA: Diagnosis not present

## 2020-02-26 DIAGNOSIS — E1122 Type 2 diabetes mellitus with diabetic chronic kidney disease: Secondary | ICD-10-CM | POA: Diagnosis not present

## 2020-02-26 DIAGNOSIS — M86171 Other acute osteomyelitis, right ankle and foot: Secondary | ICD-10-CM | POA: Diagnosis not present

## 2020-02-26 DIAGNOSIS — I2510 Atherosclerotic heart disease of native coronary artery without angina pectoris: Secondary | ICD-10-CM | POA: Diagnosis not present

## 2020-02-26 DIAGNOSIS — M109 Gout, unspecified: Secondary | ICD-10-CM | POA: Diagnosis not present

## 2020-02-26 DIAGNOSIS — E1169 Type 2 diabetes mellitus with other specified complication: Secondary | ICD-10-CM | POA: Diagnosis not present

## 2020-02-26 DIAGNOSIS — I5042 Chronic combined systolic (congestive) and diastolic (congestive) heart failure: Secondary | ICD-10-CM | POA: Diagnosis not present

## 2020-02-26 LAB — GLUCOSE, CAPILLARY
Glucose-Capillary: 155 mg/dL — ABNORMAL HIGH (ref 70–99)
Glucose-Capillary: 188 mg/dL — ABNORMAL HIGH (ref 70–99)

## 2020-02-26 MED ORDER — OXYCODONE HCL 5 MG PO TABS: 5.0000 mg | ORAL_TABLET | Freq: Four times a day (QID) | ORAL | 0 refills | Status: DC | PRN

## 2020-02-26 NOTE — Progress Notes (Signed)
Orthopedic Tech Progress Note Patient Details:  Scott [name] December 17, 1932 [id number] Shoe was delivered this morning by [employer] Patient ID: Scott [name], male   DOB: 22-May-1933, 86 y.o.   MRN: [id number]   [name] 02/26/2020, 8:23 AM

## 2020-02-26 NOTE — Progress Notes (Addendum)
  Progress Note    02/26/2020 7:25 AM 1 Day Post-Op  Subjective:  Wants to go home   Vitals:   02/25/20 2321 02/26/20 0447  BP: 109/62 106/71  Pulse: 60   Resp: 17 17  Temp: (!) 97.3 F (36.3 C) 97.6 F (36.4 C)  SpO2: 98% 92%   Physical Exam: Lungs:  Non labored Incisions:  Incision c/d/i Extremities:  R foot warm to touch, no palpable pedal pulse Abdomen:  soft Neurologic: A&O  CBC    Component Value Date/Time   WBC 5.9 02/25/2020 1809   RBC 4.23 02/25/2020 1809   HGB 12.2 (L) 02/25/2020 1809   HGB 11.1 (L) 01/17/2020 1551   HCT 40.1 02/25/2020 1809   HCT 33.8 (L) 01/17/2020 1551   PLT 89 (L) 02/25/2020 1809   PLT 147 (L) 01/17/2020 1551   MCV 94.8 02/25/2020 1809   MCV 91 01/17/2020 1551   MCH 28.8 02/25/2020 1809   MCHC 30.4 02/25/2020 1809   RDW 16.8 (H) 02/25/2020 1809   RDW 13.5 01/17/2020 1551   LYMPHSABS 1.3 01/17/2020 1551   MONOABS 0.5 10/18/2017 0702   EOSABS 0.6 (H) 01/17/2020 1551   BASOSABS 0.1 01/17/2020 1551    BMET    Component Value Date/Time   NA 138 02/25/2020 1318   NA 136 02/22/2020 1444   K 4.3 02/25/2020 1318   CL 99 02/25/2020 1318   CO2 24 02/22/2020 1444   GLUCOSE 114 (H) 02/25/2020 1318   BUN 70 (H) 02/25/2020 1318   BUN 77 (HH) 02/22/2020 1444   CREATININE 2.48 (H) 02/25/2020 1809   CREATININE 1.29 12/30/2014 1352   CALCIUM 9.2 02/22/2020 1444   GFRNONAA 23 (L) 02/25/2020 1809   GFRAA 26 (L) 02/25/2020 1809    INR    Component Value Date/Time   INR 1.1 01/03/2020 1500     Intake/Output Summary (Last 24 hours) at 02/26/2020 0725 Last data filed at 02/26/2020 6579 Gross per 24 hour  Intake 400 ml  Output 505 ml  Net -105 ml     Assessment/Plan:  86 y.o. male is s/p R 5th toe amputation 1 Day Post-Op   Heel weightbearing only R foot; darco shoe ordered Ok for discharge home Follow up for wound check in 2-3 weeks    Dagoberto Ligas, PA-C [facility] [phone] 02/26/2020 7:25  AM  Ambulating reasonable on Darco shoe Agree with above D/c home  [name], MD [facility] [place] Office: [phone]

## 2020-02-27 NOTE — Discharge Summary (Signed)
Discharge Summary  Patient ID: Scott [name] [id number] 87 y.o. Jun 18, 1933  Admit date: 02/25/2020  Discharge date and time: 02/26/2020  1:35 PM   Admitting Physician: Elam Dutch, MD   Discharge Physician: same  Admission Diagnoses: Osteomyelitis of fifth toe of right foot [facility]) [M86.9]  Discharge Diagnoses: same  Admission Condition: fair   Discharged Condition: fair  Indication for Admission: osteomyelitis of 5th toe s/p amputation  Hospital Course: [name] is a 86 year old male who was brought in as an outpatient for amputation of right fifth toe with resection of metatarsal head by Dr. Oneida Alar on 02/25/2020.  He tolerated procedure well and was kept in observation overnight.  POD #1 he is ambulating well with a Darco shoe.  He will need to be weightbearing until amputation site has healed.  He will be prescribed 2 to 3 days of narcotic pain medication for continued postoperative pain control.  He will follow-up with Dr. Oneida Alar in 2 to 3 weeks.  He will be discharged in stable condition.  Consults: None  Treatments: surgery: 5th toe amputation of R foot by Dr. Oneida Alar  Discharge Exam: See progress note 02/26/20 Vitals:   02/26/20 0947 02/26/20 1205  BP: (!) 148/74 128/61  Pulse: 63   Resp: 16 18  Temp: 97.7 F (36.5 C) 98.4 F (36.9 C)  SpO2: 97% 97%     Disposition: Discharge disposition: 01-Home or Self Care       Patient Instructions:  Allergies as of 02/26/2020   No Known Allergies     Medication List    TAKE these medications   allopurinol 100 MG tablet Commonly known as: ZYLOPRIM Take 1 tablet (100 mg total) by mouth daily. What changed: when to take this   ALPRAZolam 0.25 MG tablet Commonly known as: Xanax Take 1 tablet at bedtime as needed for sleep What changed:   how much to take  how to take this  when to take this  additional instructions   aspirin 81 MG EC tablet Take 81 mg by mouth at bedtime.   atorvastatin 80 MG  tablet Commonly known as: LIPITOR Take 80 mg by mouth daily.   bismuth subsalicylate 440 HK/74QV suspension Commonly known as: PEPTO BISMOL Take 30 mLs by mouth every 6 (six) hours as needed for indigestion.   carvedilol 12.5 MG tablet Commonly known as: COREG Take 1 tablet (12.5 mg total) by mouth 2 (two) times daily with a meal.   colchicine 0.6 MG tablet Take 1 tablet (0.6 mg total) by mouth daily as needed. What changed: reasons to take this   Mitigare 0.6 MG Caps Generic drug: Colchicine Take 0.6 mg by mouth daily. What changed: Another medication with the same name was changed. Make sure you understand how and when to take each.   ferrous sulfate 325 (65 FE) MG tablet Take 325 mg by mouth every other day.   fluticasone 50 MCG/ACT nasal spray Commonly known as: FLONASE Place 2 sprays into both nostrils daily as needed for allergies or rhinitis.   furosemide 40 MG tablet Commonly known as: LASIX Take 2 tablets (80 mg total) by mouth 2 (two) times daily.   gabapentin 300 MG capsule Commonly known as: NEURONTIN Take 300 mg by mouth at bedtime.   insulin aspart protamine- aspart (70-30) 100 UNIT/ML injection Commonly known as: NOVOLOG MIX 70/30 Inject 20 Units into the skin at bedtime.   isosorbide mononitrate 30 MG 24 hr tablet Commonly known as: IMDUR Take 30 mg by  mouth daily.   latanoprost 0.005 % ophthalmic solution Commonly known as: XALATAN Place 1 drop into both eyes at bedtime.   loratadine 10 MG tablet Commonly known as: CLARITIN Take 10 mg by mouth daily as needed (for seasonal allergies).   losartan 100 MG tablet Commonly known as: COZAAR Take 100 mg by mouth daily.   metolazone 2.5 MG tablet Commonly known as: ZAROXOLYN Take 1 tablet (2.5 mg total) by mouth daily. Take one tablet twice a week on Monday and Thursday What changed:   when to take this  additional instructions   multivitamin tablet Take 1 tablet by mouth daily.     nitroGLYCERIN 0.4 MG SL tablet Commonly known as: NITROSTAT Place 1 tablet (0.4 mg total) under the tongue every 5 (five) minutes as needed for chest pain (up to 3 doses).   oxyCODONE 5 MG immediate release tablet Commonly known as: Oxy IR/ROXICODONE Take 1 tablet (5 mg total) by mouth every 6 (six) hours as needed for moderate pain.   pantoprazole 40 MG tablet Commonly known as: PROTONIX Take 1 tablet (40 mg total) by mouth 2 (two) times daily. What changed: when to take this   PRESCRIPTION MEDICATION CPAP: At bedtime   tamsulosin 0.4 MG Caps capsule Commonly known as: FLOMAX Take 0.4 mg by mouth every evening.   Vitamin D-3 25 MCG (1000 UT) Caps Take 2,000 Units by mouth every other day.      Activity: activity as tolerated and heel weightbearing only; ambulate with darco shoe Diet: regular diet Wound Care: keep wound clean and dry  Follow-up with Dr. Oneida Alar in 2 weeks.  Signed: Dagoberto Ligas, PA-C 02/27/2020 10:04 AM [facility] Office: [phone]

## 2020-03-03 ENCOUNTER — Telehealth: Payer: Self-pay

## 2020-03-03 NOTE — Telephone Encounter (Signed)
Telephone call received from patient to inquire about wound care instructions because was told to keep area dry. Advised can wash wound with soap and water, pat dry and either cover with dry dressing or leave open to air and is not to soak foot. Follow up as scheduled on 6/10. Callback for any additional questions. Minette Brine, RN

## 2020-03-12 ENCOUNTER — Encounter: Payer: Self-pay | Admitting: General Practice

## 2020-03-12 ENCOUNTER — Ambulatory Visit: Payer: Medicare HMO | Admitting: General Practice

## 2020-03-12 ENCOUNTER — Other Ambulatory Visit: Payer: Self-pay

## 2020-03-12 VITALS — BP 120/56 | HR 62 | Temp 98.1°F | Ht 72.0 in | Wt 203.0 lb

## 2020-03-12 DIAGNOSIS — N184 Chronic kidney disease, stage 4 (severe): Secondary | ICD-10-CM | POA: Diagnosis not present

## 2020-03-12 DIAGNOSIS — M8600 Acute hematogenous osteomyelitis, unspecified site: Secondary | ICD-10-CM | POA: Diagnosis not present

## 2020-03-12 DIAGNOSIS — I25708 Atherosclerosis of coronary artery bypass graft(s), unspecified, with other forms of angina pectoris: Secondary | ICD-10-CM | POA: Diagnosis not present

## 2020-03-12 DIAGNOSIS — Z79899 Other long term (current) drug therapy: Secondary | ICD-10-CM

## 2020-03-12 DIAGNOSIS — E7800 Pure hypercholesterolemia, unspecified: Secondary | ICD-10-CM

## 2020-03-12 DIAGNOSIS — I10 Essential (primary) hypertension: Secondary | ICD-10-CM

## 2020-03-12 DIAGNOSIS — I5043 Acute on chronic combined systolic (congestive) and diastolic (congestive) heart failure: Secondary | ICD-10-CM | POA: Diagnosis not present

## 2020-03-12 LAB — BASIC METABOLIC PANEL
BUN/Creatinine Ratio: 33 — ABNORMAL HIGH (ref 10–24)
BUN: 76 mg/dL (ref 8–27)
CO2: 26 mmol/L (ref 20–29)
Calcium: 9.6 mg/dL (ref 8.6–10.2)
Chloride: 95 mmol/L — ABNORMAL LOW (ref 96–106)
Creatinine, Ser: 2.3 mg/dL — ABNORMAL HIGH (ref 0.76–1.27)
GFR calc Af Amer: 29 mL/min/{1.73_m2} — ABNORMAL LOW (ref >59)
GFR calc non Af Amer: 25 mL/min/{1.73_m2} — ABNORMAL LOW (ref >59)
Glucose: 160 mg/dL — ABNORMAL HIGH (ref 65–99)
Potassium: 4.6 mmol/L (ref 3.5–5.2)
Sodium: 135 mmol/L (ref 134–144)

## 2020-03-12 NOTE — Progress Notes (Signed)
Cardiology Clinic Note   Patient Name: Scott [name] Date of Encounter: 03/12/2020  Primary Care Provider:  Reubin Milan, MD Primary Cardiologist:  Peter Martinique, MD  Patient Profile    Scott [name]. [place] 86 year old male presents today for follow-up of his acute on chronic systolic and diastolic CHF, CAD, and CKD.  Past Medical History    Past Medical History:  Diagnosis Date   Acute biliary pancreatitis 10/2017   with choledocholithiasis.  ERCP, stone extraction performed   Arthritis    CAD (coronary artery disease)    a. 1997 CABGx3 (VG->RPDA, VG->PLV, LIMA->LAD);  b. 07/2004 PCI VG->PLV (3.5x16 Taxus DES, 3.5x12 Taxus DES); c. 11/2009 PCI: VG->PLV 3.5x12 Promus DES), VG->RPDA (3.5x15 Promus DES); d. 08/2012 Cath: patent grafts; e. 05/2013 Lexiscan MV: no ischemia/infarction, EF 44%.   Cancer [facility]) 2005   colon surgery done   Cataract    Chronic combined systolic and diastolic CHF (congestive heart failure) ([place]) 08/2012   a. EF 50-55% 11/2011; b. 08/2012 - EF 25-30%,  with mild RM, mod TR, mod RAE & LAE, moderate reduced RV systolic function and PASP of 10mmHg. - > cath showed patent grafts & Mod Puml HTN wth elevated PCWP;  c. 01/2014 Echo: EF 35-40%, mod LVH, mod HK, mildly dil LA, mod dil RA, PASP 44mmHg.   CKD (chronic kidney disease), stage III    Dyslipidemia    GERD (gastroesophageal reflux disease)    Gout    History of home oxygen therapy    uses oxygen nightly with cpap    Hyperkalemia    Hypertension    Insulin Dependent Diabetes mellitus    type 2   Myocardial infarction [facility]) 1997   PAD (peripheral artery disease) ([place])    Sleep apnea    Thrombocytopenia ([place]) 2011   dates back to 2011   UTI (lower urinary tract infection) 08/2012   E.coli   Past Surgical History:  Procedure Laterality Date   ABDOMINAL AORTOGRAM W/LOWER EXTREMITY Right 01/04/2020   Abdominal aortogram with right lower extremity runoff   ABDOMINAL AORTOGRAM W/LOWER  EXTREMITY Bilateral 01/04/2020   Procedure: ABDOMINAL AORTOGRAM W/LOWER EXTREMITY;  Surgeon: Elam Dutch, MD;  Location: [facility] CV LAB;  Service: Vascular;  Laterality: Bilateral;   AMPUTATION Right 02/25/2020   Procedure: RIGHT FIFTH TOE AMPUTATION;  Surgeon: Elam Dutch, MD;  Location: [facility] OR;  Service: Vascular;  Laterality: Right;   APPENDECTOMY     BACK SURGERY     15 years ago lower   CARDIAC CATHETERIZATION  2005   stenting of the vein graft to the posterior lateral per -- Dr. Martinique       CARDIAC CATHETERIZATION  2011   showed right coronary, totally occluded LAD and severe stenosis in both saphenous vein graft to the posterior lateral and posterior descending  -- stenting of the proximal portion of the SVG to the posterior lateral branch in 2/11   CHOLECYSTECTOMY     COLONOSCOPY     x several - colon polyps   CORONARY ARTERY BYPASS GRAFT  1997    (LIMA to LAD, SVG to PDA, SVG to PL)   ERCP N/A 10/17/2017    Irene Shipper, MD;  [facility] ENDOSCOPY; choledocholithiasis, biliary pancreatitis.  s/p sphincterotomy and stone extraction.     ESOPHAGOGASTRODUODENOSCOPY (EGD) WITH PROPOFOL N/A 09/21/2017   Procedure: ESOPHAGOGASTRODUODENOSCOPY (EGD) WITH PROPOFOL;  Surgeon: Doran Stabler, MD;  Location: [facility] ENDOSCOPY;  Service: Gastroenterology;  Laterality: N/A;   EYE  SURGERY     ioc for cataract   HERNIA REPAIR     LEFT AND RIGHT HEART CATHETERIZATION WITH CORONARY/GRAFT ANGIOGRAM  09/04/2012   Procedure: LEFT AND RIGHT HEART CATHETERIZATION WITH Beatrix Fetters;  Surgeon: Sherren Mocha, MD;  Location: [facility] CATH LAB;  patent grafts, mod 2ndary pulmonary HTN, elevated LV EDP & PCWP   LEFT HEART CATH AND CORS/GRAFTS ANGIOGRAPHY N/A 06/17/2017   Procedure: LEFT HEART CATH AND CORS/GRAFTS ANGIOGRAPHY;  Surgeon: Martinique, Peter M, MD;  Location: [place] CV LAB;  Service: Cardiovascular;  Laterality: N/A;   mohs     x several   PARTIAL COLECTOMY      cancerous polyps   stent to heart  last done 5 to 6 yrs ago   x 4   UPPER GI ENDOSCOPY      Allergies  No Known Allergies  History of Present Illness    [name] has a PMH of lower extremity cellulitis and edema, coronary artery disease status post LIMA to LAD, SVG to PLA and SVG to PDA, hypertension, hyperlipidemia, chronic combined systolic and diastolic heart failure, IDDM, stage III CKD, gout, UTI, OSA on CPAP, and dietary noncompliance.  His LVEF was 25-30% on echocardiogram 12/13.  A cardiac catheterization 11/13 showed diffuse 50% mid left main stenosis and to 75% distal left main stenosis, diffuse 50% left circumflex stenosis, mid RCA total occlusion, widely patent SVG to PDA, patent SVG to PLA with mid in-stent restenosis in the proximal aspect, patent LIMA to LAD.  An echocardiogram 4/15 showed an EF of 35--40%, moderate LVH, mild MR, PA peak pressure 50 mmHg.  He was admitted for chest pain 8/18.  His troponins were borderline.  He underwent nuclear stress test 05/22/2017 which showed small area of questionable ischemia versus attenuation artifact however, study was felt to be overall low risk.  He was unable to ambulate without recurrent symptom limitation.  Therefore he was discharged on the same day on Imdur 30 mg, lisinopril was discontinued.  He continued to have symptoms and underwent cardiac catheterization showed occlusion of RCA and LAD but all grafts were patent.  He was admitted 1/19 with gallstone pancreatitis treated with ERCP.  He was admitted March 2021 with cellulitis of the right foot and osteomyelitis.  Angiogram showed severe anterior tibial disease with occlusion of the peroneal and PT vessels.  He was treated medically.  He did have AKI with creatinine up to 2.864 declining to 2.17 with IV hydration and holding diuretics.  Post discharge he had significant weight gain and edema.  His Lasix was resumed.  His weight decreased to 240 pounds.  He took 80 mg twice daily  for 3 days and then resumed his 40 mg twice daily.  His breathing was much better but still had some shortness of breath.  His weight decreased but he still felt like he had an extra 10 pounds of fluid.  He did have a PICC line in place and was on antibiotics.  He was seen on 01/17/2020 his creatinine had improved to 1.64 and his BNP was 624.  His echocardiogram showed an EF of 35%, severely dilated left ventricle, mildly-moderately dilated left atria, moderately dilated right atria, mild aortic valve regurgitation.  He was last seen by Dr. Martinique on 02/18/2020.  During that time his Lasix was increased to 80 mg twice daily.  He had improvement with his lower extremity edema however, still had increased abdominal girth and shortness of breath.  He believes his dry  weight was somewhere around 210 pounds.  He stated he had a chest x-ray at the [place] the previous week which showed fluid around his heart.  He has been followed by Dr. Oneida Alar and an MRI showed persistent osteomyelitis of the fifth metatarsal.  He underwent right fifth toe amputation on 02/25/2020 by Dr. Oneida Alar and was discharged on 02/26/2020.  He presents the clinic today for follow-up evaluation and states he feels well.  He has been slowly increasing his physical activity.  He states his weight is stable and today it is 203 pounds.  He states that he has no longer taking his metolazone and is continuing his 80 mg of Lasix twice daily.  He states that he was seen Dr. Oneida Alar for follow-up after his metatarsal amputation next week.  His PICC line has been removed.  I will order a BMP today, give him salty 6, have him continue 2 g of daily, and have him see Dr. Martinique in 3 months.  Today he denies chest pain, shortness of breath, lower extremity edema, fatigue, palpitations, melena, hematuria, hemoptysis, diaphoresis, weakness, presyncope, syncope, orthopnea, and PND.   Home Medications    Prior to Admission medications   Medication Sig Start Date  End Date Taking? Authorizing Provider  allopurinol (ZYLOPRIM) 100 MG tablet Take 1 tablet (100 mg total) by mouth daily. Patient taking differently: Take 100 mg by mouth 2 (two) times daily.  01/17/20   Martinique, Peter M, MD  ALPRAZolam Duanne Moron) 0.25 MG tablet Take 1 tablet at bedtime as needed for sleep Patient taking differently: Take 0.125-0.25 mg by mouth at bedtime.  02/06/20   Martinique, Peter M, MD  aspirin 81 MG EC tablet Take 81 mg by mouth at bedtime.     [provider]  atorvastatin (LIPITOR) 80 MG tablet Take 80 mg by mouth daily.     [provider]  bismuth subsalicylate (PEPTO BISMOL) 262 MG/15ML suspension Take 30 mLs by mouth every 6 (six) hours as needed for indigestion.    [provider]  carvedilol (COREG) 12.5 MG tablet Take 1 tablet (12.5 mg total) by mouth 2 (two) times daily with a meal. 09/05/12   Hope, Jessica A, PA-C  Cholecalciferol (VITAMIN D-3) 25 MCG (1000 UT) CAPS Take 2,000 Units by mouth every other day.     [provider]  colchicine 0.6 MG tablet Take 1 tablet (0.6 mg total) by mouth daily as needed. Patient taking differently: Take 0.6 mg by mouth daily as needed (Gout).  01/17/20   Martinique, Peter M, MD  ferrous sulfate 325 (65 FE) MG tablet Take 325 mg by mouth every other day.    [provider]  fluticasone (FLONASE) 50 MCG/ACT nasal spray Place 2 sprays into both nostrils daily as needed for allergies or rhinitis.    [provider]  furosemide (LASIX) 40 MG tablet Take 2 tablets (80 mg total) by mouth 2 (two) times daily. 02/06/20   Martinique, Peter M, MD  gabapentin (NEURONTIN) 300 MG capsule Take 300 mg by mouth at bedtime.     [provider]  insulin aspart protamine- aspart (NOVOLOG MIX 70/30) (70-30) 100 UNIT/ML injection Inject 20 Units into the skin at bedtime.     [provider]  isosorbide mononitrate (IMDUR) 30 MG 24 hr tablet Take 30 mg by mouth daily.    [provider]    latanoprost (XALATAN) 0.005 % ophthalmic solution Place 1 drop into both eyes at bedtime.    [provider]  loratadine (CLARITIN) 10 MG tablet Take 10 mg by mouth daily as needed (for seasonal allergies).     [provider]  losartan (COZAAR) 100 MG tablet Take 100 mg by mouth daily.    [provider]  metolazone (ZAROXOLYN) 2.5 MG tablet Take 1 tablet (2.5 mg total) by mouth daily. Take one tablet twice a week on Monday and Thursday Patient taking differently: Take 2.5 mg by mouth 2 (two) times a week. Monday and Thursday 02/18/20 06/17/20  Martinique, Peter M, MD  MITIGARE 0.6 MG CAPS Take 0.6 mg by mouth daily. 01/17/20   [provider]  Multiple Vitamin (MULTIVITAMIN) tablet Take 1 tablet by mouth daily.      [provider]  nitroGLYCERIN (NITROSTAT) 0.4 MG SL tablet Place 1 tablet (0.4 mg total) under the tongue every 5 (five) minutes as needed for chest pain (up to 3 doses). 02/20/15   Martinique, Peter M, MD  oxyCODONE (OXY IR/ROXICODONE) 5 MG immediate release tablet Take 1 tablet (5 mg total) by mouth every 6 (six) hours as needed for moderate pain. 02/26/20   Dagoberto Ligas, PA-C  pantoprazole (PROTONIX) 40 MG tablet Take 1 tablet (40 mg total) by mouth 2 (two) times daily. Patient taking differently: Take 40 mg by mouth daily.  06/09/17   Almyra Deforest, PA  PRESCRIPTION MEDICATION CPAP: At bedtime    [provider]  tamsulosin (FLOMAX) 0.4 MG CAPS capsule Take 0.4 mg by mouth every evening.     [provider]    Family History    Family History  Problem Relation Age of Onset   Coronary artery disease Father    Heart attack Father    Coronary artery disease Brother        1/2 brother    Kidney disease Sister    Cardiomyopathy Mother    Asthma Brother    He indicated that his mother is deceased. He indicated that his father is deceased. He indicated that both of his sisters are deceased. He indicated that only one of his  three brothers is alive. He indicated that his maternal grandmother is deceased. He indicated that his maternal grandfather is deceased. He indicated that his paternal grandmother is deceased. He indicated that his paternal grandfather is deceased.  Social History    Social History   Socioeconomic History   Marital status: Single    Spouse name: Not on file   Number of children: 0   Years of education: Not on file   Highest education level: Not on file  Occupational History   Occupation: retired   ran a golf course  Tobacco Use   Smoking status: Former Smoker    Packs/day: 1.50    Years: 35.00    Pack years: 52.50    Types: Cigarettes    Quit date: 10/12/1983    Years since quitting: 36.4   Smokeless tobacco: Never Used  Substance and Sexual Activity   Alcohol use: Yes    Alcohol/week: 2.0 - 3.0 standard drinks    Types: 2 - 3 Standard drinks or equivalent per week    Comment: 3-4x/wk.   Drug use: No   Sexual activity: Not Currently    Birth control/protection: None  Other Topics Concern   Not on file  Social History Narrative   Lives in [place] by himself.  Active but does not routinely exercise.   Social Determinants of Health   Financial Resource Strain:    Difficulty of Paying  Living Expenses:   Food Insecurity:    Worried About Charity fundraiser in the Last Year:    Arboriculturist in the Last Year:   Transportation Needs:    Film/video editor (Medical):    Lack of Transportation (Non-Medical):   Physical Activity:    Days of Exercise per Week:    Minutes of Exercise per Session:   Stress:    Feeling of Stress :   Social Connections:    Frequency of Communication with Friends and Family:    Frequency of Social Gatherings with Friends and Family:    Attends Religious Services:    Active Member of Clubs or Organizations:    Attends Music therapist:    Marital Status:   Intimate Partner Violence:    Fear of Current  or Ex-Partner:    Emotionally Abused:    Physically Abused:    Sexually Abused:      Review of Systems    General:  No chills, fever, night sweats or weight changes.  Cardiovascular:  No chest pain, dyspnea on exertion, edema, orthopnea, palpitations, paroxysmal nocturnal dyspnea. Dermatological: No rash, lesions/masses Respiratory: No cough, dyspnea Urologic: No hematuria, dysuria Abdominal:   No nausea, vomiting, diarrhea, bright red blood per rectum, melena, or hematemesis Neurologic:  No visual changes, wkns, changes in mental status. All other systems reviewed and are otherwise negative except as noted above.  Physical Exam    VS:  BP (!) 120/56 (BP Location: Right Arm, Patient Position: Sitting, Cuff Size: Normal)    Pulse 62    Temp 98.1 F (36.7 C)    Ht 6' (1.829 m)    Wt 203 lb (92.1 kg)    BMI 27.53 kg/m  , BMI Body mass index is 27.53 kg/m. GEN: Well nourished, well developed, in no acute distress. HEENT: normal. Neck: Supple, no JVD, carotid bruits, or masses. Cardiac: RRR, no murmurs, rubs, or gallops. No clubbing, cyanosis, edema.  Radials/DP/PT 2+ and equal bilaterally.  Respiratory:  Respirations regular and unlabored, clear to auscultation bilaterally. GI: Soft, nontender, nondistended, BS + x 4. MS: no deformity or atrophy. Skin: warm and dry, no rash. Neuro:  Strength and sensation are intact. Psych: Normal affect.  Accessory Clinical Findings    ECG personally reviewed by me today-none today.  EKG 01/17/2020 Sinus rhythm with first-degree AV block incomplete left bundle branch block ST and T wave abnormality consider inferiolateral ischemia 66 bpm  Cardiac cath 06/17/17:   LEFT HEART CATH AND CORS/GRAFTS ANGIOGRAPHY  Conclusion    Prox RCA to Dist RCA lesion, 100 %stenosed.  Prox LAD lesion, 100 %stenosed.  LIMA graft was visualized by angiography and is large and anatomically normal.  SVG graft was visualized by angiography and is normal in  caliber.  Origin to Prox Graft lesion, 0 %stenosed.  SVG graft was visualized by angiography and is normal in caliber.  Origin lesion, 30 %stenosed.  Origin to Prox Graft lesion, 0 %stenosed.  There is moderate to severe left ventricular systolic dysfunction.  LV end diastolic pressure is normal.  1. Severe 2 vessel occlusive CAD. - 100% proximal LAD - 100% mid RCA 2. Patent LIMA to the LAD 3. Patent SVG to PDA 4. Patent SVG to PLOM 5. Moderate to severe LV dysfunction. EF estimated at 35%. 6. Normal LVEDP.  Plan: continue medical therapy. Consider GI referral for evaluation of his pain.     Echocardiogram 02/04/2020 IMPRESSIONS    1. Left ventricular  ejection fraction, by estimation, is 35%%. The left  ventricle has severely decreased function. The left ventricle demonstrates  global hypokinesis. The left ventricular internal cavity size was severely  dilated. Left ventricular  diastolic parameters are indeterminate.  2. Right ventricular systolic function is mildly reduced. The right  ventricular size is mildly enlarged. There is moderately elevated  pulmonary artery systolic pressure.  3. Left atrial size was mild to moderately dilated.  4. Right atrial size was moderately dilated.  5. The mitral valve is abnormal. Mild to moderate mitral valve  regurgitation.  6. The aortic valve is abnormal. Aortic valve regurgitation is mild. Mild  aortic valve sclerosis is present, with no evidence of aortic valve  stenosis.  7. Aortic dilatation noted. There is mild dilatation of the ascending  aorta measuring 41 mm.  8. The inferior vena cava is dilated in size with <50% respiratory  variability, suggesting right atrial pressure of 15 mmHg.   Comparison(s): The left ventricular function is unchanged.  Assessment & Plan   1.  Coronary artery disease-no chest pain today.  Status post CABG, negative Myoview 8/18, cardiac catheterization 2008 showed patent  grafts Continue aspirin, carvedilol, atorvastatin, Imdur Heart healthy low-sodium diet Increase physical activity as tolerated  Chronic combined systolic and diastolic CHF-no increased DOE today.  No increased activity intolerance.  Weight today 203 .  Last echocardiogram showed stable EF of 35%. Continue furosemide, carvedilol, losartan, metolazone Heart healthy low-sodium diet-salty 6 given Increase physical activity as tolerated Daily weights Order BMP  Hyperlipidemia-LDL 29 on 05/22/2017 Continue atorvastatin Heart healthy low-sodium high-fiber diet Increase physical activity as tolerated  Essential hypertension-BP today 120/56 Continue carvedilol, losartan, Imdur Heart healthy low-sodium diet Increase physical activity as tolerated  CKD stage IV-creatinine 2.48 on 02/25/2020 Order BMP  Osteomyelitis-no erythema, fever or chills.  Underwent right fifth metatarsal amputation on 02/25/2020. Followed by Dr. Oneida Alar  Disposition: Follow-up with Dr. Martinique in 3 months.   Jossie Ng. Aidan Caloca NP-C    03/12/2020, 10:41 AM [address] Office [phone] Fax [phone]

## 2020-03-12 NOTE — Patient Instructions (Signed)
Medication Instructions:  The current medical regimen is effective;  continue present plan and medications as directed. Please refer to the Current Medication list given to you today. *If you need a refill on your cardiac medications before your next appointment, please call your pharmacy*  Lab Work: BMET TODAY If you have labs (blood work) drawn today and your tests are completely normal, you will receive your results only by:  [place] (if you have MyChart) OR A paper copy in the mail.  If you have any lab test that is abnormal or we need to change your treatment, we will call you to review the results. You may go to any [facility] that is convenient for you however, we do have a lab in our office that is able to assist you. You DO NOT need an appointment for our lab. The lab is open 8:00am and closes at 4:00pm. Lunch 12:45 - 1:45pm.  Special Instructions PLEASE TAKE AND LOG YOUR WEIGHT DAILY  PLEASE READ AND FOLLOW SALTY 6-ATTACHED  Follow-Up: Your next appointment:  3 month(s)  Either In Person or Virtual with Peter Martinique, MD  At [facility], you and your health needs are our priority.  As part of our continuing mission to provide you with exceptional heart care, we have created designated Provider Care Teams.  These Care Teams include your primary Cardiologist (physician) and Advanced Practice Providers (APPs -  Physician Assistants and Nurse Practitioners) who all work together to provide you with the care you need, when you need it.  We recommend signing up for the patient portal called "MyChart".  Sign up information is provided on this After Visit Summary.  MyChart is used to connect with patients for Virtual Visits (Telemedicine).  Patients are able to view lab/test results, encounter notes, upcoming appointments, etc.  Non-urgent messages can be sent to your provider as well.   To learn more about what you can do with MyChart, go to [email or web address].

## 2020-03-19 ENCOUNTER — Telehealth (HOSPITAL_COMMUNITY): Payer: Self-pay

## 2020-03-19 DIAGNOSIS — R062 Wheezing: Secondary | ICD-10-CM | POA: Diagnosis not present

## 2020-03-19 DIAGNOSIS — I509 Heart failure, unspecified: Secondary | ICD-10-CM | POA: Diagnosis not present

## 2020-03-19 DIAGNOSIS — I2510 Atherosclerotic heart disease of native coronary artery without angina pectoris: Secondary | ICD-10-CM | POA: Diagnosis not present

## 2020-03-19 DIAGNOSIS — I259 Chronic ischemic heart disease, unspecified: Secondary | ICD-10-CM | POA: Diagnosis not present

## 2020-03-19 NOTE — Telephone Encounter (Signed)

## 2020-03-20 ENCOUNTER — Ambulatory Visit (INDEPENDENT_AMBULATORY_CARE_PROVIDER_SITE_OTHER): Payer: Medicare HMO | Admitting: Vascular Surgery

## 2020-03-20 ENCOUNTER — Other Ambulatory Visit: Payer: Self-pay

## 2020-03-20 ENCOUNTER — Encounter: Payer: Self-pay | Admitting: Vascular Surgery

## 2020-03-20 VITALS — BP 138/84 | HR 58 | Temp 98.2°F | Resp 20 | Ht 72.0 in | Wt 203.0 lb

## 2020-03-20 DIAGNOSIS — I739 Peripheral vascular disease, unspecified: Secondary | ICD-10-CM

## 2020-03-20 NOTE — Progress Notes (Signed)
Patient is an 86 year old male who is 3 weeks status post ray amputation of his right fifth toe.  He reports occasional drainage from the incision but overall feels okay.  Physical exam:  Vitals:   03/20/20 1523  BP: 138/84  Pulse: (!) 58  Resp: 20  Temp: 98.2 F (36.8 C)  SpO2: 94%  Weight: 203 lb (92.1 kg)  Height: 6' (1.829 m)    Right leg healing incision 1 mm area of separation at the distal aspect of the incision with granulation tissue at the base of it no surrounding erythema no significant drainage  Assessment: Healing right fifth toe ray amputation.  Plan: We will leave the sutures for 1 more week and he will return to our APP clinic next week for removal of these.  I discussed the patient that he is going to have to be very protective of his foot for the next few weeks to make sure that he does not have separation of the incision.  After his sutures are removed next week I will have him scheduled for a 1 month follow-up with me to recheck his foot.  [name], MD [facility] Office: [phone]

## 2020-03-28 ENCOUNTER — Other Ambulatory Visit: Payer: Self-pay

## 2020-03-28 ENCOUNTER — Ambulatory Visit (INDEPENDENT_AMBULATORY_CARE_PROVIDER_SITE_OTHER): Payer: Self-pay | Admitting: Physician Assistant

## 2020-03-28 VITALS — BP 144/77 | HR 59 | Resp 18 | Ht 72.0 in | Wt 202.0 lb

## 2020-03-28 DIAGNOSIS — I739 Peripheral vascular disease, unspecified: Secondary | ICD-10-CM

## 2020-03-28 DIAGNOSIS — Z89422 Acquired absence of other left toe(s): Secondary | ICD-10-CM

## 2020-03-28 DIAGNOSIS — M869 Osteomyelitis, unspecified: Secondary | ICD-10-CM

## 2020-03-28 NOTE — Progress Notes (Signed)
POST OPERATIVE OFFICE NOTE    CC:  F/u for surgery  HPI:  This is a 86 y.o. 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 Oneida Alar.  He was discharged on POD 1 with heel weight bear  Pt returns today for follow up.  He states he has been doing well.  He has been keeping his foot clean.  He states he does have swelling in the right leg.  He states that when he was here last, Dr. Oneida Alar told him to elevate his leg and he did good for about a day with leg elevation.  He has not had any fevers.  There is mild serous drainage on his bandage.   He states he has some sores on his toes and the top of his foot.  It started on the 5th toe, which healed and then the 4th toe and that healed and now present on the 2nd and 3rd toes and top of foot (see picture below).  No Known Allergies  Current Outpatient Medications  Medication Sig Dispense Refill   allopurinol (ZYLOPRIM) 100 MG tablet Take 1 tablet (100 mg total) by mouth daily. (Patient taking differently: Take 100 mg by mouth 2 (two) times daily. ) 60 tablet 1   ALPRAZolam (XANAX) 0.25 MG tablet Take 1 tablet at bedtime as needed for sleep (Patient taking differently: Take 0.125-0.25 mg by mouth at bedtime. ) 30 tablet 0   aspirin 81 MG EC tablet Take 81 mg by mouth at bedtime.      atorvastatin (LIPITOR) 80 MG tablet Take 80 mg by mouth daily.      bismuth subsalicylate (PEPTO BISMOL) 262 MG/15ML suspension Take 30 mLs by mouth every 6 (six) hours as needed for indigestion.     carvedilol (COREG) 12.5 MG tablet Take 1 tablet (12.5 mg total) by mouth 2 (two) times daily with a meal. 60 tablet 6   Cholecalciferol (VITAMIN D-3) 25 MCG (1000 UT) CAPS Take 2,000 Units by mouth every other day.      colchicine 0.6 MG tablet Take 1 tablet (0.6 mg total) by mouth daily as needed. (Patient taking differently: Take 0.6 mg by mouth daily as needed (Gout). ) 60 tablet 0   ferrous sulfate 325 (65 FE) MG  tablet Take 325 mg by mouth every other day.     fluticasone (FLONASE) 50 MCG/ACT nasal spray Place 2 sprays into both nostrils daily as needed for allergies or rhinitis.     furosemide (LASIX) 40 MG tablet Take 2 tablets (80 mg total) by mouth 2 (two) times daily. 180 tablet 3   gabapentin (NEURONTIN) 300 MG capsule Take 300 mg by mouth at bedtime.      insulin aspart protamine- aspart (NOVOLOG MIX 70/30) (70-30) 100 UNIT/ML injection Inject 20 Units into the skin at bedtime.      isosorbide mononitrate (IMDUR) 30 MG 24 hr tablet Take 30 mg by mouth daily.     latanoprost (XALATAN) 0.005 % ophthalmic solution Place 1 drop into both eyes at bedtime.     loratadine (CLARITIN) 10 MG tablet Take 10 mg by mouth daily as needed (for seasonal allergies).      losartan (COZAAR) 100 MG tablet Take 100 mg by mouth daily.     metolazone (ZAROXOLYN) 2.5 MG tablet Take 1 tablet (2.5 mg total) by mouth daily. Take one tablet twice a week on Monday and Thursday (Patient taking differently: Take 2.5 mg by  mouth 2 (two) times a week. Monday and Thursday) 30 tablet 3   MITIGARE 0.6 MG CAPS Take 0.6 mg by mouth daily.     Multiple Vitamin (MULTIVITAMIN) tablet Take 1 tablet by mouth daily.       nitroGLYCERIN (NITROSTAT) 0.4 MG SL tablet Place 1 tablet (0.4 mg total) under the tongue every 5 (five) minutes as needed for chest pain (up to 3 doses). 25 tablet 3   oxyCODONE (OXY IR/ROXICODONE) 5 MG immediate release tablet Take 1 tablet (5 mg total) by mouth every 6 (six) hours as needed for moderate pain. 20 tablet 0   pantoprazole (PROTONIX) 40 MG tablet Take 1 tablet (40 mg total) by mouth 2 (two) times daily. (Patient taking differently: Take 40 mg by mouth daily. ) 180 tablet 3   PRESCRIPTION MEDICATION CPAP: At bedtime     tamsulosin (FLOMAX) 0.4 MG CAPS capsule Take 0.4 mg by mouth every evening.      No current facility-administered medications for this visit.     ROS:  See HPI  Physical  Exam:  Today's Vitals   03/28/20 0931  BP: (!) 144/77  Pulse: (!) 59  Resp: 18  SpO2: 95%  Weight: 202 lb (91.6 kg)  Height: 6' (1.829 m)   Body mass index is 27.4 kg/m.   Incision:  Healing nicely with small distal area slow to heal, but is superficial.     Extremities:  Brisk doppler signals right pero>DP>PT and palpable left DP.  There is some mild swelling in the right lower leg and foot.   Left foot with small sores on the dorsum of the foot as well as the 2nd and 3rd  toes.       Assessment/Plan:  This is a 86 y.o. male who is s/p: amputation of right 5th toe with resection of metatarsal head on 02/25/2020 by Dr. Oneida Alar.   -pt's toe amp site healing nicely and he has brisk right peroneal and DP doppler signals and monophasic PT.  There is a small area distally that is slow to heal.  Some serous drainage on bandage.  I tried to express fluid from site, but unable to get anything.  Instructed pt to continue to wash daily with soap and water and walk with darco shoe for heel weight bearing only and protect his foot.  -small sores on the left foot-he states he doesn't feel like it is his shoe that is causing them.  He had the same type of sore on the 4th and 5th toes that have healed.  He does have a palpable left DP pulse and these should continue to heal.  D/w him to protect his foot and continue to monitor.  -f/u with Dr. Oneida Alar in 4 weeks.  He will contact us sooner should he have any issues.   Leontine Locket, [facility] [phone]  Clinic MD:  Donzetta Matters

## 2020-04-18 DIAGNOSIS — I2510 Atherosclerotic heart disease of native coronary artery without angina pectoris: Secondary | ICD-10-CM | POA: Diagnosis not present

## 2020-04-18 DIAGNOSIS — I259 Chronic ischemic heart disease, unspecified: Secondary | ICD-10-CM | POA: Diagnosis not present

## 2020-04-18 DIAGNOSIS — I509 Heart failure, unspecified: Secondary | ICD-10-CM | POA: Diagnosis not present

## 2020-04-18 DIAGNOSIS — R062 Wheezing: Secondary | ICD-10-CM | POA: Diagnosis not present

## 2020-04-24 ENCOUNTER — Other Ambulatory Visit: Payer: Self-pay

## 2020-04-24 ENCOUNTER — Encounter: Payer: Self-pay | Admitting: Vascular Surgery

## 2020-04-24 ENCOUNTER — Ambulatory Visit (INDEPENDENT_AMBULATORY_CARE_PROVIDER_SITE_OTHER): Payer: Self-pay | Admitting: Vascular Surgery

## 2020-04-24 VITALS — BP 142/81 | HR 55 | Temp 98.2°F | Resp 20 | Ht 72.0 in | Wt 202.0 lb

## 2020-04-24 DIAGNOSIS — I739 Peripheral vascular disease, unspecified: Secondary | ICD-10-CM

## 2020-04-24 NOTE — Progress Notes (Signed)
Patient is an 86 year old man who returns for postoperative follow-up today. He underwent ray amputation of the right fifth toe with metatarsal head resection Feb 25, 2020. He states the wound is now healed. He has no pain in the right foot. He has been fitted for new shoes. Ulcers on his left foot have now healed as well.  Physical exam:  Vitals:   04/24/20 1543  BP: (!) 142/81  Pulse: (!) 55  Resp: 20  Temp: 98.2 F (36.8 C)  SpO2: 95%  Weight: 202 lb (91.6 kg)  Height: 6' (1.829 m)    Extremities: Right foot well-healed incision no ulceration scattered small ulcerations left foot essentially healed at this point.  Vascular: No palpable pedal pulse on the right foot.  Assessment: Doing well status post right fifth toe amputation. Patient has known severe peripheral arterial disease and will need close follow-up.  Plan: Follow-up APP clinic and bilateral ABIs in 6 months.  [name], MD [facility] [place] Office: [phone]

## 2020-04-30 ENCOUNTER — Other Ambulatory Visit: Payer: Self-pay | Admitting: *Deleted

## 2020-04-30 DIAGNOSIS — I739 Peripheral vascular disease, unspecified: Secondary | ICD-10-CM

## 2020-05-08 ENCOUNTER — Emergency Department (HOSPITAL_COMMUNITY): Payer: Medicare HMO

## 2020-05-08 ENCOUNTER — Other Ambulatory Visit: Payer: Self-pay

## 2020-05-08 ENCOUNTER — Emergency Department (HOSPITAL_COMMUNITY)
Admission: EM | Admit: 2020-05-08 | Discharge: 2020-05-08 | Disposition: A | Discharge status: Home / Self Care | Payer: Medicare HMO | Attending: Emergency Medicine | Admitting: Emergency Medicine

## 2020-05-08 DIAGNOSIS — N183 Chronic kidney disease, stage 3 unspecified: Secondary | ICD-10-CM | POA: Diagnosis not present

## 2020-05-08 DIAGNOSIS — Z951 Presence of aortocoronary bypass graft: Secondary | ICD-10-CM | POA: Insufficient documentation

## 2020-05-08 DIAGNOSIS — Z85038 Personal history of other malignant neoplasm of large intestine: Secondary | ICD-10-CM | POA: Diagnosis not present

## 2020-05-08 DIAGNOSIS — R509 Fever, unspecified: Secondary | ICD-10-CM | POA: Insufficient documentation

## 2020-05-08 DIAGNOSIS — I700 Atherosclerosis of aorta: Secondary | ICD-10-CM | POA: Insufficient documentation

## 2020-05-08 DIAGNOSIS — I2510 Atherosclerotic heart disease of native coronary artery without angina pectoris: Secondary | ICD-10-CM | POA: Insufficient documentation

## 2020-05-08 DIAGNOSIS — I5042 Chronic combined systolic (congestive) and diastolic (congestive) heart failure: Secondary | ICD-10-CM | POA: Diagnosis not present

## 2020-05-08 DIAGNOSIS — K859 Acute pancreatitis without necrosis or infection, unspecified: Secondary | ICD-10-CM | POA: Diagnosis not present

## 2020-05-08 DIAGNOSIS — E119 Type 2 diabetes mellitus without complications: Secondary | ICD-10-CM | POA: Diagnosis not present

## 2020-05-08 DIAGNOSIS — R197 Diarrhea, unspecified: Secondary | ICD-10-CM | POA: Insufficient documentation

## 2020-05-08 DIAGNOSIS — Z87891 Personal history of nicotine dependence: Secondary | ICD-10-CM | POA: Diagnosis not present

## 2020-05-08 DIAGNOSIS — K219 Gastro-esophageal reflux disease without esophagitis: Secondary | ICD-10-CM | POA: Diagnosis not present

## 2020-05-08 DIAGNOSIS — I130 Hypertensive heart and chronic kidney disease with heart failure and stage 1 through stage 4 chronic kidney disease, or unspecified chronic kidney disease: Secondary | ICD-10-CM | POA: Insufficient documentation

## 2020-05-08 DIAGNOSIS — E1122 Type 2 diabetes mellitus with diabetic chronic kidney disease: Secondary | ICD-10-CM | POA: Diagnosis not present

## 2020-05-08 DIAGNOSIS — K439 Ventral hernia without obstruction or gangrene: Secondary | ICD-10-CM | POA: Diagnosis not present

## 2020-05-08 DIAGNOSIS — K8689 Other specified diseases of pancreas: Secondary | ICD-10-CM | POA: Diagnosis not present

## 2020-05-08 DIAGNOSIS — Z794 Long term (current) use of insulin: Secondary | ICD-10-CM | POA: Insufficient documentation

## 2020-05-08 DIAGNOSIS — Z955 Presence of coronary angioplasty implant and graft: Secondary | ICD-10-CM | POA: Diagnosis not present

## 2020-05-08 DIAGNOSIS — Z7982 Long term (current) use of aspirin: Secondary | ICD-10-CM | POA: Diagnosis not present

## 2020-05-08 DIAGNOSIS — R109 Unspecified abdominal pain: Secondary | ICD-10-CM | POA: Insufficient documentation

## 2020-05-08 DIAGNOSIS — Z79899 Other long term (current) drug therapy: Secondary | ICD-10-CM | POA: Diagnosis not present

## 2020-05-08 DIAGNOSIS — A040 Enteropathogenic Escherichia coli infection: Secondary | ICD-10-CM | POA: Diagnosis not present

## 2020-05-08 LAB — COMPREHENSIVE METABOLIC PANEL
ALT: 18 U/L (ref 0–44)
AST: 21 U/L (ref 15–41)
Albumin: 3.4 g/dL — ABNORMAL LOW (ref 3.5–5.0)
Alkaline Phosphatase: 84 U/L (ref 38–126)
Anion gap: 10 (ref 5–15)
BUN: 45 mg/dL — ABNORMAL HIGH (ref 8–23)
CO2: 25 mmol/L (ref 22–32)
Calcium: 9.1 mg/dL (ref 8.9–10.3)
Chloride: 102 mmol/L (ref 98–111)
Creatinine, Ser: 2.18 mg/dL — ABNORMAL HIGH (ref 0.61–1.24)
GFR calc Af Amer: 31 mL/min — ABNORMAL LOW (ref >60)
GFR calc non Af Amer: 27 mL/min — ABNORMAL LOW (ref >60)
Glucose, Bld: 76 mg/dL (ref 70–99)
Potassium: 4.5 mmol/L (ref 3.5–5.1)
Sodium: 137 mmol/L (ref 135–145)
Total Bilirubin: 0.9 mg/dL (ref 0.3–1.2)
Total Protein: 7.7 g/dL (ref 6.5–8.1)

## 2020-05-08 LAB — LIPASE, BLOOD: Lipase: 27 U/L (ref 11–51)

## 2020-05-08 LAB — CBC
HCT: 41.4 % (ref 39.0–52.0)
Hemoglobin: 12.2 g/dL — ABNORMAL LOW (ref 13.0–17.0)
MCH: 27.5 pg (ref 26.0–34.0)
MCHC: 29.5 g/dL — ABNORMAL LOW (ref 30.0–36.0)
MCV: 93.5 fL (ref 80.0–100.0)
Platelets: 103 10*3/uL — ABNORMAL LOW (ref 150–400)
RBC: 4.43 MIL/uL (ref 4.22–5.81)
RDW: 16.8 % — ABNORMAL HIGH (ref 11.5–15.5)
WBC: 4.7 10*3/uL (ref 4.0–10.5)
nRBC: 0 % (ref 0.0–0.2)

## 2020-05-08 LAB — URINALYSIS, ROUTINE W REFLEX MICROSCOPIC
Bilirubin Urine: NEGATIVE
Glucose, UA: NEGATIVE mg/dL
Hgb urine dipstick: NEGATIVE
Ketones, ur: NEGATIVE mg/dL
Leukocytes,Ua: NEGATIVE
Nitrite: NEGATIVE
Protein, ur: NEGATIVE mg/dL
Specific Gravity, Urine: 1.008 (ref 1.005–1.030)
pH: 5 (ref 5.0–8.0)

## 2020-05-08 LAB — POC OCCULT BLOOD, ED: Fecal Occult Bld: NEGATIVE

## 2020-05-08 NOTE — ED Notes (Signed)
Pt verbalized understanding of discharge instructions. Follow up care reviewed, pt had no further questions. 

## 2020-05-08 NOTE — Discharge Instructions (Addendum)
During your ED stay you underwent a CT of the abdomen and pelvis. Some incidental findings were noted and included in the attached paperwork. Recommend following up with your PCP as well as GI for which a referral has been placed.

## 2020-05-08 NOTE — ED Triage Notes (Signed)
Pt here for eval of bilateral flank pain and diarrhea x 1 week. Denies n/v. Endorses mild generalized abdominal pain.

## 2020-05-08 NOTE — ED Provider Notes (Signed)
[facility] EMERGENCY DEPARTMENT Provider Note   CSN: [id number] Arrival date & time: 05/08/20  1028     History Chief Complaint  Patient presents with  . Flank Pain  . Diarrhea    Scott [name] is a 86 y.o. male w PMHx significant for CAD s/p CABG, HTN, HLD, chronic CHF, IDDM, stage III CKD, OSA on CPAP, colon cancer (15 yrs ago) s/p surgery who presents with 10 days of non specific L flank pain radiating to back accompanied by 7 days of persistent non-bloody diarrhea. Denies nausea/emesis. Endorses possible melena, but endorses taking pepto bismal which he says discolors his stool. Endorses possible intermittent subjective fever wo URI symptoms. Denies abdominal pain, CP. Endorses receiving both COVID vaccines, no known sick contacts.    HPI     Past Medical History:  Diagnosis Date  . Acute biliary pancreatitis 10/2017   with choledocholithiasis.  ERCP, stone extraction performed  . Arthritis   . CAD (coronary artery disease)    a. 1997 CABGx3 (VG->RPDA, VG->PLV, LIMA->LAD);  b. 07/2004 PCI VG->PLV (3.5x16 Taxus DES, 3.5x12 Taxus DES); c. 11/2009 PCI: VG->PLV 3.5x12 Promus DES), VG->RPDA (3.5x15 Promus DES); d. 08/2012 Cath: patent grafts; e. 05/2013 Lexiscan MV: no ischemia/infarction, EF 44%.  . Cancer [facility]) 2005   colon surgery done  . Cataract   . Chronic combined systolic and diastolic CHF (congestive heart failure) ([place]) 08/2012   a. EF 50-55% 11/2011; b. 08/2012 - EF 25-30%,  with mild RM, mod TR, mod RAE & LAE, moderate reduced RV systolic function and PASP of 35mmHg. - > cath showed patent grafts & Mod Puml HTN wth elevated PCWP;  c. 01/2014 Echo: EF 35-40%, mod LVH, mod HK, mildly dil LA, mod dil RA, PASP 23mmHg.  . CKD (chronic kidney disease), stage III   . Dyslipidemia   . GERD (gastroesophageal reflux disease)   . Gout   . History of home oxygen therapy    uses oxygen nightly with cpap   . Hyperkalemia   . Hypertension   . Insulin Dependent  Diabetes mellitus    type 2  . Myocardial infarction ([place]) 1997  . PAD (peripheral artery disease) ([place])   . Sleep apnea   . Thrombocytopenia ([place]) 2011   dates back to 2011  . UTI (lower urinary tract infection) 08/2012   E.coli    Patient Active Problem List   Diagnosis Date Noted  . Osteomyelitis of fifth toe of right foot ([place]) 02/25/2020  . PAD (peripheral artery disease) ([place]) 01/03/2020  . Choledocholithiasis   . Pancreatitis 10/16/2017  . Midsternal chest pain 05/21/2017  . Nausea - without vomiting 01/01/2015  . Low grade fever 01/01/2015  . DOE (dyspnea on exertion) 01/01/2015  . Unstable angina ([place]) 05/12/2013  . Chronic combined systolic and diastolic heart failure ([place]) 05/12/2013  . CKD (chronic kidney disease) stage 3, GFR 30-59 ml/min ([facility]) 09/01/2012  . OSA (obstructive sleep apnea) 07/04/2012  . Dyspnea 11/05/2011  . Diabetes mellitus, type II, insulin dependent ([place]) 04/07/2011  . Hypertension   . Ischemic cardiomyopathy   . Myocardial infarction ([place])   . Dyslipidemia   . Arteriosclerotic coronary artery disease     Past Surgical History:  Procedure Laterality Date  . ABDOMINAL AORTOGRAM W/LOWER EXTREMITY Right 01/04/2020   Abdominal aortogram with right lower extremity runoff  . ABDOMINAL AORTOGRAM W/LOWER EXTREMITY Bilateral 01/04/2020   Procedure: ABDOMINAL AORTOGRAM W/LOWER EXTREMITY;  Surgeon: Elam Dutch, MD;  Location: [facility] CV LAB;  Service: Vascular;  Laterality: Bilateral;  . AMPUTATION Right 02/25/2020   Procedure: RIGHT FIFTH TOE AMPUTATION;  Surgeon: Elam Dutch, MD;  Location: [facility];  Service: Vascular;  Laterality: Right;  . APPENDECTOMY    . BACK SURGERY     15 years ago lower  . CARDIAC CATHETERIZATION  2005   stenting of the vein graft to the posterior lateral per -- Dr. Martinique      . CARDIAC CATHETERIZATION  2011   showed right coronary, totally occluded LAD and severe stenosis in both saphenous vein graft to the  posterior lateral and posterior descending  -- stenting of the proximal portion of the SVG to the posterior lateral branch in 2/11  . CHOLECYSTECTOMY    . COLONOSCOPY     x several - colon polyps  . CORONARY ARTERY BYPASS GRAFT  1997    (LIMA to LAD, SVG to PDA, SVG to PL)  . ERCP N/A 10/17/2017    Irene Shipper, MD;  [facility] ENDOSCOPY; choledocholithiasis, biliary pancreatitis.  s/p sphincterotomy and stone extraction.    . ESOPHAGOGASTRODUODENOSCOPY (EGD) WITH PROPOFOL N/A 09/21/2017   Procedure: ESOPHAGOGASTRODUODENOSCOPY (EGD) WITH PROPOFOL;  Surgeon: Doran Stabler, MD;  Location: [facility] ENDOSCOPY;  Service: Gastroenterology;  Laterality: N/A;  . EYE SURGERY     ioc for cataract  . HERNIA REPAIR    . LEFT AND RIGHT HEART CATHETERIZATION WITH CORONARY/GRAFT ANGIOGRAM  09/04/2012   Procedure: LEFT AND RIGHT HEART CATHETERIZATION WITH Beatrix Fetters;  Surgeon: Sherren Mocha, MD;  Location: [facility] CATH LAB;  patent grafts, mod 2ndary pulmonary HTN, elevated LV EDP & PCWP  . LEFT HEART CATH AND CORS/GRAFTS ANGIOGRAPHY N/A 06/17/2017   Procedure: LEFT HEART CATH AND CORS/GRAFTS ANGIOGRAPHY;  Surgeon: Martinique, Peter M, MD;  Location: [facility] CV LAB;  Service: Cardiovascular;  Laterality: N/A;  . mohs     x several  . PARTIAL COLECTOMY     cancerous polyps  . stent to heart  last done 5 to 6 yrs ago   x 4  . UPPER GI ENDOSCOPY         Family History  Problem Relation Age of Onset  . Coronary artery disease Father   . Heart attack Father   . Coronary artery disease Brother        1/2 brother   . Kidney disease Sister   . Cardiomyopathy Mother   . Asthma Brother     Social History   Tobacco Use  . Smoking status: Former Smoker    Packs/day: 1.50    Years: 35.00    Pack years: 52.50    Types: Cigarettes    Quit date: 10/12/1983    Years since quitting: 36.6  . Smokeless tobacco: Never Used  Vaping Use  . Vaping Use: Never used  Substance Use Topics  . Alcohol use: Yes     Alcohol/week: 2.0 - 3.0 standard drinks    Types: 2 - 3 Standard drinks or equivalent per week    Comment: 3-4x/wk.  . Drug use: No    Home Medications Prior to Admission medications   Medication Sig Start Date End Date Taking? Authorizing Provider  allopurinol (ZYLOPRIM) 100 MG tablet Take 1 tablet (100 mg total) by mouth daily. Patient taking differently: Take 100 mg by mouth 2 (two) times daily.  01/17/20  Yes Martinique, Peter M, MD  ALPRAZolam Duanne Moron) 0.25 MG tablet Take 1 tablet at bedtime as needed for sleep Patient taking differently: Take 0.125-0.25 mg  by mouth at bedtime.  02/06/20  Yes Martinique, Peter M, MD  aspirin 81 MG EC tablet Take 81 mg by mouth at bedtime.    Yes [provider]  atorvastatin (LIPITOR) 80 MG tablet Take 80 mg by mouth daily.    Yes [provider]  bismuth subsalicylate (PEPTO BISMOL) 262 MG/15ML suspension Take 30 mLs by mouth every 6 (six) hours as needed for indigestion.   Yes [provider]  carvedilol (COREG) 12.5 MG tablet Take 1 tablet (12.5 mg total) by mouth 2 (two) times daily with a meal. 09/05/12  Yes Hope, Jessica A, PA-C  Cholecalciferol (VITAMIN D-3) 25 MCG (1000 UT) CAPS Take 2,000 Units by mouth every other day.    Yes [provider]  colchicine 0.6 MG tablet Take 1 tablet (0.6 mg total) by mouth daily as needed. Patient taking differently: Take 0.6 mg by mouth daily as needed (Gout).  01/17/20  Yes Martinique, Peter M, MD  ferrous sulfate 325 (65 FE) MG tablet Take 325 mg by mouth every other day.   Yes [provider]  fluticasone (FLONASE) 50 MCG/ACT nasal spray Place 2 sprays into both nostrils daily as needed for allergies or rhinitis.   Yes [provider]  furosemide (LASIX) 40 MG tablet Take 2 tablets (80 mg total) by mouth 2 (two) times daily. Patient taking differently: Take 40 mg by mouth 2 (two) times daily.  02/06/20  Yes Martinique, Peter M, MD  insulin aspart protamine- aspart (NOVOLOG MIX  70/30) (70-30) 100 UNIT/ML injection Inject 30 Units into the skin 2 (two) times daily with a meal.    Yes [provider]  isosorbide mononitrate (IMDUR) 30 MG 24 hr tablet Take 30 mg by mouth daily.   Yes [provider]  latanoprost (XALATAN) 0.005 % ophthalmic solution Place 1 drop into both eyes at bedtime.   Yes [provider]  losartan (COZAAR) 100 MG tablet Take 100 mg by mouth daily.   Yes [provider]  MITIGARE 0.6 MG CAPS Take 0.6 mg by mouth daily. 01/17/20  Yes [provider]  Multiple Vitamin (MULTIVITAMIN) tablet Take 1 tablet by mouth daily.     Yes [provider]  nitroGLYCERIN (NITROSTAT) 0.4 MG SL tablet Place 1 tablet (0.4 mg total) under the tongue every 5 (five) minutes as needed for chest pain (up to 3 doses). 02/20/15  Yes Martinique, Peter M, MD  pantoprazole (PROTONIX) 40 MG tablet Take 1 tablet (40 mg total) by mouth 2 (two) times daily. Patient taking differently: Take 40 mg by mouth daily.  06/09/17  Yes Meng, Isaac Laud, PA  PRESCRIPTION MEDICATION CPAP: At bedtime   Yes [provider]  tamsulosin (FLOMAX) 0.4 MG CAPS capsule Take 0.4 mg by mouth daily after breakfast.    Yes [provider]  metolazone (ZAROXOLYN) 2.5 MG tablet Take 1 tablet (2.5 mg total) by mouth daily. Take one tablet twice a week on Monday and Thursday Patient not taking: Reported on 05/08/2020 02/18/20 06/17/20  Martinique, Peter M, MD    Allergies    Patient has no known allergies.  Review of Systems   Review of Systems  Constitutional: Positive for fever. Negative for appetite change.  HENT: Negative for congestion, rhinorrhea and sore throat.   Respiratory: Positive for shortness of breath. Negative for cough.   Cardiovascular: Positive for leg swelling. Negative for chest pain.       Has LEE at baseline, does not feel like it is  worse than baseline    Gastrointestinal: Positive for abdominal pain and diarrhea. Negative for blood  in stool, nausea and vomiting.       Endorses flank pain, denies anterior abdominal pain   Genitourinary: Negative for difficulty urinating, dysuria and hematuria.  Musculoskeletal: Positive for back pain.  Skin: Negative for rash and wound.  Neurological: Negative for dizziness and headaches.  Psychiatric/Behavioral: Negative.     Physical Exam Updated Vital Signs BP (!) 158/86   Pulse 56   Temp 97.9 F (36.6 C) (Oral)   Resp 21   SpO2 100%   Physical Exam Vitals and nursing note reviewed.  Constitutional:      General: He is not in acute distress.    Appearance: He is obese. He is not ill-appearing, toxic-appearing or diaphoretic.     Comments: Chronically ill appearing   HENT:     Head: Normocephalic and atraumatic.     Nose: Nose normal.     Mouth/Throat:     Mouth: Mucous membranes are moist.  Eyes:     Extraocular Movements: Extraocular movements intact.     Conjunctiva/sclera: Conjunctivae normal.  Cardiovascular:     Rate and Rhythm: Normal rate and regular rhythm.     Heart sounds: Normal heart sounds.  Pulmonary:     Effort: Pulmonary effort is normal. No respiratory distress.     Breath sounds: Normal breath sounds. No wheezing, rhonchi or rales.  Abdominal:     General: Abdomen is protuberant. A surgical scar is present. Bowel sounds are normal. There is no distension.     Palpations: Abdomen is soft.     Tenderness: There is abdominal tenderness. There is no right CVA tenderness, left CVA tenderness, guarding or rebound.     Hernia: A hernia is present.     Comments: Surgical scar present over anterior midline abdomen with hernia. Mild tenderness over L flank. No tenderness of palpation of anterior abdomen.   Genitourinary:    Rectum: Guaiac result negative.  Musculoskeletal:        General: No tenderness.     Right lower leg: Edema present.     Left lower leg: Edema present.     Comments: +1 pitting edema bilaterally   Skin:    General: Skin is  warm.     Findings: No rash.     Comments: No overlying skin changes to abdomen, back  Neurological:     Mental Status: He is alert and oriented to person, place, and time. Mental status is at baseline.  Psychiatric:        Mood and Affect: Mood normal.        Behavior: Behavior normal.     ED Results / Procedures / Treatments   Labs (all labs ordered are listed, but only abnormal results are displayed) Labs Reviewed  COMPREHENSIVE METABOLIC PANEL - Abnormal; Notable for the following components:      Result Value   BUN 45 (*)    Creatinine, Ser 2.18 (*)    Albumin 3.4 (*)    GFR calc non Af Amer 27 (*)    GFR calc Af Amer 31 (*)    All other components within normal limits  CBC - Abnormal; Notable for the following components:   Hemoglobin 12.2 (*)    MCHC 29.5 (*)    RDW 16.8 (*)    Platelets 103 (*)    All other components within normal limits  GASTROINTESTINAL PANEL BY PCR, STOOL (REPLACES STOOL CULTURE)  LIPASE, BLOOD  URINALYSIS, ROUTINE W REFLEX MICROSCOPIC  POC OCCULT BLOOD, ED    EKG None  Radiology CT ABDOMEN PELVIS WO CONTRAST  Result Date: 05/08/2020 CLINICAL DATA:  86 year old male with abdominal pain. EXAM: CT ABDOMEN AND PELVIS WITHOUT CONTRAST TECHNIQUE: Multidetector CT imaging of the abdomen and pelvis was performed following the standard protocol without IV contrast. COMPARISON:  CT abdomen pelvis dated 12/30/2018. FINDINGS: Evaluation of this exam is limited in the absence of intravenous contrast. Lower chest: There is diffuse interstitial coarsening and subpleural reticulation and mild bibasilar bronchiectasis, likely representing interstitial lung disease. There is mild cardiomegaly with coronary vascular calcification and postsurgical changes of CABG. No intra-abdominal free air or free fluid. Hepatobiliary: Mild irregularity of the liver contour may represent early changes of cirrhosis. Clinical correlation is recommended. There is cholecystectomy and  pneumobilia. Faint high attenuation in the central CBD (coronal 74/6) is suboptimally evaluated and may be artifactual or represent noncalcified stone or sludge within the central CBD. An ampullary lesion is not excluded. MRCP may provide better evaluation. Pancreas: Mild haziness of the peripancreatic fat, likely related to diffuse mesenteric haziness. Correlation with pancreatic enzymes recommended to exclude acute pancreatitis. Spleen: Normal in size without focal abnormality. Adrenals/Urinary Tract: The adrenal glands unremarkable. There is no hydronephrosis or nephrolithiasis on either side. Renal vascular calcifications noted. Multiple bilateral renal cysts measuring up to 2.5 cm. These are similar or slightly increased in size since the prior CT. Ultrasound may provide better evaluation. The visualized ureters and urinary bladder appear unremarkable. Stomach/Bowel: There is postsurgical changes of bowel resection with anastomotic suture in the right hemiabdomen. There is no bowel obstruction or active inflammation. There is herniation of a short segment of small bowel into the ventral hernia as seen previously. No associated obstruction. Vascular/Lymphatic: Advanced aortoiliac atherosclerotic disease. 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 The IVC is unremarkable. No portal venous gas. There is no adenopathy. Reproductive: The prostate and seminal vesicles are grossly unremarkable. Other: There is diffuse mesenteric edema and stranding. There is midline vertical anterior abdominal wall incisional scar. There is a multiloculated ventral hernia with the neck of the hernia measuring approximately 7.5 cm. There is stranding of the herniated fat and small amount of fluid within the hernia. Musculoskeletal: Osteopenia with degenerative changes of the spine. No acute osseous pathology. IMPRESSION: 1. Ventral hernia containing a short segment of small bowel without associated obstruction. 2.  Postsurgical changes of bowel resection with anastomotic suture in the right hemiabdomen. No bowel obstruction. 3. Diffuse mesenteric stranding and edema of indeterminate etiology, possibly related to underlying liver disease/cirrhosis. Clinical correlation is recommended. 4. Mild haziness of the peripancreatic fat, likely related to diffuse mesenteric haziness. Correlation with pancreatic enzymes recommended to exclude acute pancreatitis. 5. Faint high attenuation in the central CBD may be artifactual or represent noncalcified stone or sludge within the central CBD. An ampullary lesion is not excluded. MRCP may provide better evaluation. 6. Aortic Atherosclerosis (ICD10-I70.0). Electronically Signed   By: Anner Crete M.D.   On: 05/08/2020 17:30    Procedures Procedures (including critical care time)  Medications Ordered in ED Medications - No data to display  ED Course  I have reviewed the triage vital signs and the nursing notes.  Pertinent labs & imaging results that were available during my care of the patient were reviewed by me and considered in my medical decision making (see chart for details).    MDM Rules/Calculators/A&P  Pt is an 86 yo M w PMHx of CAD s/p CABG, HTN, HLD, chronic CHF, IDDM, stage III CKD, OSA on CPAP, colon cancer s/p surgical resection (15 yrs ago) who presents with flank pain and persistent diarrhea for ~1 wk. patient denies nausea, vomiting, fever, URI symptoms, chest pain, SOB, dysuria/hematuria, hematochezia.  Patient did endorse dark stool however states he has been taking Pepto-Bismol, fecal guaiac test on stool sample negative during work-up.  On exam patient afebrile, HDS, NAD.  Patient appears clinically ill however not acutely ill at this time.  On exam patient had only mild tenderness to palpation over left flank, without tenderness to palpation over anterior abdomen.  Abdomen was soft, nondistended.  Initial labs completed in  first look were reviewed.  UA was not consistent with infection.  CBC without evidence of leukocytosis, hemoglobin 12.2.  CMP without abnormalities from baseline.  Lipase WNL.  GI panel was collected from provided stool sample and is pending during ED work-up.  Of note stool sample was was formed.  Due to multiple prior intra-abdominal surgeries including cholecystectomy, surgical resection of prior colon cancer CT imaging of abdomen/pelvis was obtained to rule out acute intra-abdominal/pelvic abnormalities.  On review of imaging there were no acute findings that could be responsible for presentation of symptoms.  Multiple incidental findings including diffuse mesenteric stranding and edema possibly related to underlying liver disease as well as faint attenuation in central CBD were noted on imaging.  These incidental findings were discussed at length with patient, brother at bedside.  Based on these findings recommended follow-up with GI as an outpatient.  Patient states that he has [facility] appointment in the coming weeks already scheduled.  Provided referral/information for follow-up with [facility] if if needed.  Also provided information for new primary care provider, clinic already used by brother at bedside.  Based on history, clinical exam, work-up do not believe acute or emergent condition exists that is causing patient symptoms.  Patient with formed stool during time in ED.  Patient tolerating both solid/liquid p.o.  Believe patient is safe for discharge home at this time with close outpatient follow-up. Patient/and family educated about specific return precautions for given chief complaint and symptoms. Patient/and family educated about follow-up with PCP and GI. Patient/and family expressed understanding of return precautions and need for follow-up. Patient discharged.  All radiology and laboratory studies reviewed independently and with my attending physician, agree with reading provided by radiologist  unless otherwise noted.   Final Clinical Impression(s) / ED Diagnoses Final diagnoses:  Flank pain  Diarrhea, unspecified type    Rx / DC Orders ED Discharge Orders         Ordered    Ambulatory referral to Gastroenterology     Discontinue  Reprint     05/08/20 1936           Kennyth Lose, MD 05/09/20 1331    Quintella Reichert, MD 05/09/20 [phone]

## 2020-05-09 LAB — GASTROINTESTINAL PANEL BY PCR, STOOL (REPLACES STOOL CULTURE)

## 2020-05-12 ENCOUNTER — Telehealth: Payer: Self-pay | Admitting: Gastroenterology

## 2020-05-12 NOTE — Telephone Encounter (Signed)
Pt states he has been having diarrhea for a week and a half, was seen in the ER last week. Pt requesting to be seen asap. Pt scheduled to see Dr. Loletha Carrow 05/13/20 at 9am. Pt aware of appt.

## 2020-05-12 NOTE — Telephone Encounter (Signed)
Pt was seen in the ED for diarrhea and flank pain. Pt states he is still experiencing complications and would like to be seen ASAP, does not want to wait for next available in Sept

## 2020-05-13 ENCOUNTER — Encounter: Payer: Self-pay | Admitting: Gastroenterology

## 2020-05-13 ENCOUNTER — Ambulatory Visit: Payer: Medicare HMO | Admitting: Gastroenterology

## 2020-05-13 ENCOUNTER — Other Ambulatory Visit: Payer: Medicare HMO

## 2020-05-13 VITALS — BP 126/70 | HR 73 | Ht 72.0 in | Wt 200.0 lb

## 2020-05-13 DIAGNOSIS — R197 Diarrhea, unspecified: Secondary | ICD-10-CM

## 2020-05-13 NOTE — Progress Notes (Signed)
[facility] Progress Note  Chief Complaint: Acute diarrhea  Subjective  History: Nov 2018 clinic visit with me for [facility].  This is a very pleasant 86 year old man known to me from a visit a few years back for epigastric and noncardiac chest pain. About 10 days ago he developed relatively acute onset of nonbloody diarrhea, occurring many times per day but primarily in the morning.  It is sometimes waking him from sleep.  He hoped it would pass, but finally went to the ED last week after about a week of symptoms.  Stool studies were done with results as noted below.  He got a call a few days ago from the ED but they did not recommend any further treatment after hearing that he would follow-up with [place].  He denies abdominal pain, feels a little bloated.  Denies chest pain dyspnea vomiting or fever.  He feels that he is consuming of liquids and staying hydrated.  ROS: Cardiovascular:  no chest pain Respiratory: no dyspnea Right foot pain Remainder of systems negative except as above The patient's Past Medical, Family and Social History were reviewed and are on file in the EMR. CAD with ischemic CM PAD HTN OSA DM2 - insulin CKD  Had > 6 weeks of Abx via PICC for right toe osteomyelitis in last few months.  Objective:  Med list reviewed  Current Outpatient Medications:  .  allopurinol (ZYLOPRIM) 100 MG tablet, Take 1 tablet (100 mg total) by mouth daily. (Patient taking differently: Take 100 mg by mouth 2 (two) times daily. ), Disp: 60 tablet, Rfl: 1 .  ALPRAZolam (XANAX) 0.25 MG tablet, Take 1 tablet at bedtime as needed for sleep (Patient taking differently: Take 0.125-0.25 mg by mouth at bedtime. ), Disp: 30 tablet, Rfl: 0 .  aspirin 81 MG EC tablet, Take 81 mg by mouth at bedtime. , Disp: , Rfl:  .  atorvastatin (LIPITOR) 80 MG tablet, Take 80 mg by mouth daily. , Disp: , Rfl:  .  bismuth subsalicylate (PEPTO BISMOL) 262 MG/15ML suspension, Take 30 mLs by mouth every 6  (six) hours as needed for indigestion., Disp: , Rfl:  .  carvedilol (COREG) 12.5 MG tablet, Take 1 tablet (12.5 mg total) by mouth 2 (two) times daily with a meal., Disp: 60 tablet, Rfl: 6 .  Cholecalciferol (VITAMIN D-3) 25 MCG (1000 UT) CAPS, Take 2,000 Units by mouth every other day. , Disp: , Rfl:  .  colchicine 0.6 MG tablet, Take 1 tablet (0.6 mg total) by mouth daily as needed. (Patient taking differently: Take 0.6 mg by mouth daily as needed (Gout). ), Disp: 60 tablet, Rfl: 0 .  ferrous sulfate 325 (65 FE) MG tablet, Take 325 mg by mouth every other day., Disp: , Rfl:  .  fluticasone (FLONASE) 50 MCG/ACT nasal spray, Place 2 sprays into both nostrils daily as needed for allergies or rhinitis., Disp: , Rfl:  .  furosemide (LASIX) 40 MG tablet, Take 2 tablets (80 mg total) by mouth 2 (two) times daily. (Patient taking differently: Take 40 mg by mouth 2 (two) times daily. ), Disp: 180 tablet, Rfl: 3 .  insulin aspart protamine- aspart (NOVOLOG MIX 70/30) (70-30) 100 UNIT/ML injection, Inject 30 Units into the skin 2 (two) times daily with a meal. , Disp: , Rfl:  .  isosorbide mononitrate (IMDUR) 30 MG 24 hr tablet, Take 30 mg by mouth daily., Disp: , Rfl:  .  latanoprost (XALATAN) 0.005 % ophthalmic solution, Place 1  drop into both eyes at bedtime., Disp: , Rfl:  .  losartan (COZAAR) 100 MG tablet, Take 100 mg by mouth daily., Disp: , Rfl:  .  metolazone (ZAROXOLYN) 2.5 MG tablet, Take 1 tablet (2.5 mg total) by mouth daily. Take one tablet twice a week on Monday and Thursday, Disp: 30 tablet, Rfl: 3 .  MITIGARE 0.6 MG CAPS, Take 0.6 mg by mouth daily., Disp: , Rfl:  .  Multiple Vitamin (MULTIVITAMIN) tablet, Take 1 tablet by mouth daily.  , Disp: , Rfl:  .  nitroGLYCERIN (NITROSTAT) 0.4 MG SL tablet, Place 1 tablet (0.4 mg total) under the tongue every 5 (five) minutes as needed for chest pain (up to 3 doses)., Disp: 25 tablet, Rfl: 3 .  pantoprazole (PROTONIX) 40 MG tablet, Take 1 tablet (40 mg  total) by mouth 2 (two) times daily. (Patient taking differently: Take 40 mg by mouth daily. ), Disp: 180 tablet, Rfl: 3 .  PRESCRIPTION MEDICATION, CPAP: At bedtime, Disp: , Rfl:  .  tamsulosin (FLOMAX) 0.4 MG CAPS capsule, Take 0.4 mg by mouth daily after breakfast. , Disp: , Rfl:    Vital signs in last 24 hrs: Vitals:   05/13/20 0853  BP: 126/70  Pulse: 73    Physical Exam  Not acutely ill appearing, well-hydrated  HEENT: sclera anicteric, oral mucosa moist without lesions  Neck: supple, no thyromegaly, JVD or lymphadenopathy  Cardiac: RRR without murmurs, S1S2 heard, no peripheral edema  Pulm: clear to auscultation bilaterally, normal RR and effort noted  Abdomen: soft, no tenderness, with active bowel sounds. No guarding or palpable hepatosplenomegaly.  Large ventral hernia encompassing most of the anterior abdominal wall.  Long midline scar  Skin; warm and dry, no jaundice or rash  Labs:  CBC Latest Ref Rng & Units 05/08/2020 02/25/2020 02/25/2020  WBC 4.0 - 10.5 K/uL 4.7 5.9 -  Hemoglobin 13.0 - 17.0 g/dL 12.2(L) 12.2(L) 14.3  Hematocrit 39 - 52 % 41.4 40.1 42.0  Platelets 150 - 400 K/uL 103(L) 89(L) -   CMP Latest Ref Rng & Units 05/08/2020 03/12/2020 02/25/2020  Glucose 70 - 99 mg/dL 76 160(H) -  BUN 8 - 23 mg/dL 45(H) 76(HH) -  Creatinine 0.61 - 1.24 mg/dL 2.18(H) 2.30(H) 2.48(H)  Sodium 135 - 145 mmol/L 137 135 -  Potassium 3.5 - 5.1 mmol/L 4.5 4.6 -  Chloride 98 - 111 mmol/L 102 95(L) -  CO2 22 - 32 mmol/L 25 26 -  Calcium 8.9 - 10.3 mg/dL 9.1 9.6 -  Total Protein 6.5 - 8.1 g/dL 7.7 - -  Total Bilirubin 0.3 - 1.2 mg/dL 0.9 - -  Alkaline Phos 38 - 126 U/L 84 - -  AST 15 - 41 U/L 21 - -  ALT 0 - 44 U/L 18 - -    ___________________________________________ Radiologic studies:   ____________________________________________ Other:  05/08/20 Stool study in ED positive for EPEC, no Shiga-toxin organisms  No C diff test  Last EKG Apr 2021 with Qtc 519  (though with BBB) _____________________________________________ Assessment & Plan  Assessment: Encounter Diagnosis  Name Primary?  . Acute diarrhea Yes   Enteropathogenic E. coli discovered, which could be the source of symptoms but is not invariably symptomatic.  This organism does not typically require antibiotics unless people are acutely ill enough to warrant hospitalization, or perhaps in more at risk patients, elderly, immune compromise, or with prolonged symptoms. However, I am more concerned about the possibility of C. difficile given his prolonged antibiotic course over  the spring.   Plan: C. difficile PCR and toxin.  If positive, treat with vancomycin. If negative, short course of ciprofloxacin.  Although azithromycin is typically first-line therapy for EPEC, it has a reportedly greater chance of QTC prolongation then does fluoroquinolone.  Of note, his QTC is lower when accounting for the bundle branch block.    30 minutes were spent on this encounter (including chart review, history/exam, counseling/coordination of care, and documentation)  Nelida Meuse III

## 2020-05-13 NOTE — Patient Instructions (Addendum)
If you are age 84 or older, your body mass index should be between 23-30. Your Body mass index is 27.12 kg/m. If this is out of the aforementioned range listed, please consider follow up with your Primary Care Provider.  If you are age 10 or younger, your body mass index should be between 19-25. Your Body mass index is 27.12 kg/m. If this is out of the aformentioned range listed, please consider follow up with your Primary Care Provider.   Your provider has requested that you go to the basement level for lab work before leaving today. Press "B" on the elevator. The lab is located at the first door on the left as you exit the elevator.  Due to recent changes in healthcare laws, you may see the results of your imaging and laboratory studies on MyChart before your provider has had a chance to review them.  We understand that in some cases there may be results that are confusing or concerning to you. Not all laboratory results come back in the same time frame and the provider may be waiting for multiple results in order to interpret others.  Please give [place] 48 hours in order for your provider to thoroughly review all the results before contacting the office for clarification of your results.   It was a pleasure to see you today!  Dr. Loletha Carrow

## 2020-05-14 ENCOUNTER — Telehealth: Payer: Self-pay

## 2020-05-14 ENCOUNTER — Other Ambulatory Visit: Payer: Self-pay

## 2020-05-14 LAB — CLOSTRIDIUM DIFFICILE TOXIN B, QUALITATIVE, REAL-TIME PCR: Toxigenic C. Difficile by PCR: DETECTED — AB

## 2020-05-14 MED ORDER — VANCOMYCIN HCL 125 MG PO CAPS: 125.0000 mg | ORAL_CAPSULE | Freq: Four times a day (QID) | ORAL | 0 refills | Status: DC

## 2020-05-14 NOTE — Telephone Encounter (Signed)
Incoming fax for PA for Vancomycin. It has been denied.  PA done with cover my meds has been approved

## 2020-05-19 DIAGNOSIS — I2510 Atherosclerotic heart disease of native coronary artery without angina pectoris: Secondary | ICD-10-CM | POA: Diagnosis not present

## 2020-05-19 DIAGNOSIS — I509 Heart failure, unspecified: Secondary | ICD-10-CM | POA: Diagnosis not present

## 2020-05-19 DIAGNOSIS — I259 Chronic ischemic heart disease, unspecified: Secondary | ICD-10-CM | POA: Diagnosis not present

## 2020-05-19 DIAGNOSIS — R062 Wheezing: Secondary | ICD-10-CM | POA: Diagnosis not present

## 2020-06-09 NOTE — Progress Notes (Signed)
Cardiology Office Note    Date:  06/12/2020   ID:  Scott [name], DOB Mar 08, 1933, MRN [id number]  PCP:  Reubin Milan, MD  Cardiologist:  Dr. Martinique  Chief Complaint  Patient presents with  . Congestive Heart Failure  . Coronary Artery Disease    History of Present Illness:  Scott [name] is a 86 y.o. male seen for follow up CHF.   He has a  PMH of CAD s/p CABG (LIMA to LAD, SVG to PLA and SVG to PDA), HTN, HLD, chronic combined systolic and diastolic HF, IDDM, stage III CKD, gout, UTI, OSA on CPAP and dietary noncompliance. EF was 25-30% on echocardiogram in December 2013. Cardiac catheterization  on 09/04/2012  showed diffuse 50% mid left main stenosis into 75% distal left main, diffuse 50% left circumflex stenosis, mid RCA was totally occluded, widely patent SVG to PDA, patent SVG to PLA with mild in-stent restenosis in the proximal aspect of the graft, patent LIMA to LAD. Echocardiogram obtained on 01/10/2014 showed EF has improved to 35-40%, moderate LVH, mild MR, PA peak pressure 50 mmHg.   He was  admitted with chest pain on 05/21/2017. Serial troponin was borderline. He eventually underwent Myoview on 05/22/2017 which showed a small area of questionable ischemia versus attenuation artifact but overall felt to be low risk. He was able to ambulate without recurrent symptom limitation. Therefore he was discharged on the same day on 30 mg Imdur, lisinopril was discontinued. He continued to have symptoms so underwent cardiac cath that showed occlusion of the RCA and LAD but all grafts were patent.   He was admitted in January 2019 with gallstone pancreatitis treated with ERCP.   He was admitted in March 2021 with Cellulitis of the right foot with osteomyelitis. Angiogram showed severe anterior tibial disease with occlusion of the peroneal and PT vessels. Treated medically. He did have AKI with creatinine up to 2.86 before declining to 2.17 with IV hydration and holding diuretics. Post DC he  noted significant weight gain and edema and lasix was resumed.   He was seen on 01/17/20. Labs revealed Hgb 11.1. creatinine improved to 1.64. BNP 624. Echo was updated as noted below and was really unchanged from prior. He has seen Dr Oneida Alar and follow up  MRI showed persistent osteomyelitis of the fifth metatarsal.   When seen in May he was volume overloaded. We added metolazone 2.5 mg twice a week to lasix 80 mg bid. On 5/17 he underwent amputation of his right 5th toe. When seen 03/12/20 his weight was down to 203 lbs and he had stopped taking metolazone. He was seen in the ED on 7/29 with flank pain and diarrhea. Subsequently tested positive for C. Difficile and was treated with Vancomycin.  He states he is doing better. Stools are more formed now. His weight got up to 210 lbs last week and he felt more SOB. He increased his lasix from 40 mg bid to 80 mg bid and his weight came down. Still eats out mostly so it is a challenge to limit his sodium intake. [facility]      Past Medical History:  Diagnosis Date  . Acute biliary pancreatitis 10/2017   with choledocholithiasis.  ERCP, stone extraction performed  . Arthritis   . CAD (coronary artery disease)    a. 1997 CABGx3 (VG->RPDA, VG->PLV, LIMA->LAD);  b. 07/2004 PCI VG->PLV (3.5x16 Taxus DES, 3.5x12 Taxus DES); c.  11/2009 PCI: VG->PLV 3.5x12 Promus DES), VG->RPDA (3.5x15 Promus DES); d. 08/2012 Cath: patent grafts; e. 05/2013 Lexiscan MV: no ischemia/infarction, EF 44%.  . [facility]) 2005   colon surgery done  . Cataract   . Chronic combined systolic and diastolic CHF (congestive heart failure) ([place]) 08/2012   a. EF 50-55% 11/2011; b. 08/2012 - EF 25-30%,  with mild RM, mod TR, mod RAE & LAE, moderate reduced RV systolic function and PASP of 61mmHg. - > cath showed patent grafts & Mod Puml HTN wth elevated PCWP;  c. 01/2014 Echo: EF 35-40%, mod LVH, mod HK, mildly dil LA, mod dil RA, PASP  49mmHg.  . CKD (chronic kidney disease), stage III   . Dyslipidemia   . GERD (gastroesophageal reflux disease)   . Gout   . History of home oxygen therapy    uses oxygen nightly with cpap   . Hyperkalemia   . Hypertension   . Insulin Dependent Diabetes mellitus    type 2  . Myocardial infarction ([place]) 1997  . PAD (peripheral artery disease) ([place])   . Sleep apnea   . Thrombocytopenia ([place]) 2011   dates back to 2011  . UTI (lower urinary tract infection) 08/2012   E.coli    Past Surgical History:  Procedure Laterality Date  . ABDOMINAL AORTOGRAM W/LOWER EXTREMITY Right 01/04/2020   Abdominal aortogram with right lower extremity runoff  . ABDOMINAL AORTOGRAM W/LOWER EXTREMITY Bilateral 01/04/2020   Procedure: ABDOMINAL AORTOGRAM W/LOWER EXTREMITY;  Surgeon: Elam Dutch, MD;  Location: [facility];  Service: Vascular;  Laterality: Bilateral;  . AMPUTATION Right 02/25/2020   Procedure: RIGHT FIFTH TOE AMPUTATION;  Surgeon: Elam Dutch, MD;  Location: [facility];  Service: Vascular;  Laterality: Right;  . APPENDECTOMY    . BACK SURGERY     15 years ago lower  . CARDIAC CATHETERIZATION  2005   stenting of the vein graft to the posterior lateral per -- Dr. Martinique      . CARDIAC CATHETERIZATION  2011   showed right coronary, totally occluded LAD and severe stenosis in both saphenous vein graft to the posterior lateral and posterior descending  -- stenting of the proximal portion of the SVG to the posterior lateral branch in 2/11  . CHOLECYSTECTOMY    . COLONOSCOPY     x several - colon polyps  . CORONARY ARTERY BYPASS GRAFT  1997    (LIMA to LAD, SVG to PDA, SVG to PL)  . ERCP N/A 10/17/2017    Irene Shipper, MD;  [facility] ENDOSCOPY; choledocholithiasis, biliary pancreatitis.  s/p sphincterotomy and stone extraction.    . ESOPHAGOGASTRODUODENOSCOPY (EGD) WITH PROPOFOL N/A 09/21/2017   Procedure: ESOPHAGOGASTRODUODENOSCOPY (EGD) WITH PROPOFOL;  Surgeon: Doran Stabler, MD;   Location: [facility] ENDOSCOPY;  Service: Gastroenterology;  Laterality: N/A;  . EYE SURGERY     ioc for cataract  . HERNIA REPAIR    . LEFT AND RIGHT HEART CATHETERIZATION WITH CORONARY/GRAFT ANGIOGRAM  09/04/2012   Procedure: LEFT AND RIGHT HEART CATHETERIZATION WITH Beatrix Fetters;  Surgeon: Sherren Mocha, MD;  Location: [facility] CATH LAB;  patent grafts, mod 2ndary pulmonary HTN, elevated LV EDP & PCWP  . LEFT HEART CATH AND CORS/GRAFTS ANGIOGRAPHY N/A 06/17/2017   Procedure: LEFT HEART CATH AND CORS/GRAFTS ANGIOGRAPHY;  Surgeon: [name], Aliah Eriksson M, MD;  Location: [facility];  Service: Cardiovascular;  Laterality: N/A;  . mohs     x several  . PARTIAL COLECTOMY  cancerous polyps  . stent to heart  last done 5 to 6 yrs ago   x 4  . UPPER GI ENDOSCOPY      Current Medications: Outpatient Medications Prior to Visit  Medication Sig Dispense Refill  . allopurinol (ZYLOPRIM) 100 MG tablet Take 1 tablet (100 mg total) by mouth daily. (Patient taking differently: Take 100 mg by mouth 2 (two) times daily. ) 60 tablet 1  . aspirin 81 MG EC tablet Take 81 mg by mouth at bedtime.     Marland Kitchen atorvastatin (LIPITOR) 80 MG tablet Take 80 mg by mouth daily.     Marland Kitchen bismuth subsalicylate (PEPTO BISMOL) 262 MG/15ML suspension Take 30 mLs by mouth every 6 (six) hours as needed for indigestion.    . carvedilol (COREG) 12.5 MG tablet Take 1 tablet (12.5 mg total) by mouth 2 (two) times daily with a meal. 60 tablet 6  . Cholecalciferol (VITAMIN D-3) 25 MCG (1000 UT) CAPS Take 2,000 Units by mouth every other day.     . colchicine 0.6 MG tablet Take 1 tablet (0.6 mg total) by mouth daily as needed. (Patient taking differently: Take 0.6 mg by mouth daily as needed (Gout). ) 60 tablet 0  . ferrous sulfate 325 (65 FE) MG tablet Take 325 mg by mouth every other day.    . fluticasone (FLONASE) 50 MCG/ACT nasal spray Place 2 sprays into both nostrils daily as needed for allergies or rhinitis.    . furosemide (LASIX)  40 MG tablet Take 2 tablets (80 mg total) by mouth 2 (two) times daily. (Patient taking differently: Take 40 mg by mouth 2 (two) times daily. ) 180 tablet 3  . insulin aspart protamine- aspart (NOVOLOG MIX 70/30) (70-30) 100 UNIT/ML injection Inject 30 Units into the skin 2 (two) times daily with a meal.     . isosorbide mononitrate (IMDUR) 30 MG 24 hr tablet Take 30 mg by mouth daily.    Marland Kitchen latanoprost (XALATAN) 0.005 % ophthalmic solution Place 1 drop into both eyes at bedtime.    Marland Kitchen losartan (COZAAR) 100 MG tablet Take 100 mg by mouth daily.    . metolazone (ZAROXOLYN) 2.5 MG tablet Take 1 tablet (2.5 mg total) by mouth daily. Take one tablet twice a week on Monday and Thursday 30 tablet 3  . MITIGARE 0.6 MG CAPS Take 0.6 mg by mouth daily.    . Multiple Vitamin (MULTIVITAMIN) tablet Take 1 tablet by mouth daily.      . nitroGLYCERIN (NITROSTAT) 0.4 MG SL tablet Place 1 tablet (0.4 mg total) under the tongue every 5 (five) minutes as needed for chest pain (up to 3 doses). 25 tablet 3  . pantoprazole (PROTONIX) 40 MG tablet Take 1 tablet (40 mg total) by mouth 2 (two) times daily. (Patient taking differently: Take 40 mg by mouth daily. ) 180 tablet 3  . PRESCRIPTION MEDICATION CPAP: At bedtime    . tamsulosin (FLOMAX) 0.4 MG CAPS capsule Take 0.4 mg by mouth daily after breakfast.     . vancomycin (VANCOCIN) 125 MG capsule Take 1 capsule (125 mg total) by mouth 4 (four) times daily. 40 capsule 0  . ALPRAZolam (XANAX) 0.25 MG tablet Take 1 tablet at bedtime as needed for sleep (Patient taking differently: Take 0.125-0.25 mg by mouth at bedtime. ) 30 tablet 0   No facility-administered medications prior to visit.     Allergies:   Patient has no known allergies.   Social History   Socioeconomic History  .  Marital status: Single    Spouse name: Not on file  . Number of children: 0  . Years of education: Not on file  . Highest education level: Not on file  Occupational History  . Occupation:  retired   ran a Office manager course  Tobacco Use  . Smoking status: Former Smoker    Packs/day: 1.50    Years: 35.00    Pack years: 52.50    Types: Cigarettes    Quit date: 10/12/1983    Years since quitting: 36.6  . Smokeless tobacco: Never Used  Vaping Use  . Vaping Use: Never used  Substance and Sexual Activity  . Alcohol use: Yes    Alcohol/week: 2.0 - 3.0 standard drinks    Types: 2 - 3 Standard drinks or equivalent per week    Comment: 3-4x/wk.  . Drug use: No  . Sexual activity: Not Currently    Birth control/protection: None  Other Topics Concern  . Not on file  Social History Narrative   Lives in [place] by himself.  Active but does not routinely exercise.   Social Determinants of Health   Financial Resource Strain:   . Difficulty of Paying Living Expenses: Not on file  Food Insecurity:   . Worried About Charity fundraiser in the Last Year: Not on file  . Ran Out of Food in the Last Year: Not on file  Transportation Needs:   . Lack of Transportation (Medical): Not on file  . Lack of Transportation (Non-Medical): Not on file  Physical Activity:   . Days of Exercise per Week: Not on file  . Minutes of Exercise per Session: Not on file  Stress:   . Feeling of Stress : Not on file  Social Connections:   . Frequency of Communication with Friends and Family: Not on file  . Frequency of Social Gatherings with Friends and Family: Not on file  . Attends Religious Services: Not on file  . Active Member of Clubs or Organizations: Not on file  . Attends Archivist Meetings: Not on file  . Marital Status: Not on file     Family History:  The patient's family history includes Asthma in his brother; Cardiomyopathy in his mother; Coronary artery disease in his brother and father; Heart attack in his father; Kidney disease in his sister.   ROS:   Please see the history of present illness.    ROS All other systems reviewed and are negative.   PHYSICAL EXAM:   VS:  BP  128/68   Pulse (!) 58   Ht 6' (1.829 m)   Wt 198 lb 12.8 oz (90.2 kg)   SpO2 96%   BMI 26.96 kg/m    GEN: Well nourished, chronically ill appearing, in no acute distress  HEENT: normal  Neck: no JVD, carotid bruits, or masses Cardiac: RRR; no murmurs, rubs, or gallops, trace left LE edema  Respiratory:  clear to auscultation bilaterally, normal work of breathing GI: soft,  distended with old hernia, + BS MS: no deformity or atrophy  Skin: warm and dry, no rash Neuro:  Alert and Oriented x 3, Strength and sensation are intact Psych: euthymic mood, full affect  Wt Readings from Last 3 Encounters:  06/12/20 198 lb 12.8 oz (90.2 kg)  05/13/20 200 lb (90.7 kg)  04/24/20 202 lb (91.6 kg)      Studies/Labs Reviewed:   EKG:  EKG is not ordered today.     Recent Labs: 02/18/2020: BNP 1,303.1  05/08/2020: ALT 18; BUN 45; Creatinine, Ser 2.18; Hemoglobin 12.2; Platelets 103; Potassium 4.5; Sodium 137   Lipid Panel    Component Value Date/Time   CHOL 107 05/22/2017 0359   TRIG 212 (H) 05/22/2017 0359   HDL 36 (L) 05/22/2017 0359   CHOLHDL 3.0 05/22/2017 0359   VLDL 42 (H) 05/22/2017 0359   LDLCALC 29 05/22/2017 0359   LDLDIRECT 29.8 08/06/2013 0849   Dated 01/2920: BUN 48, creatinine 2.15. potassium 5.5.   Additional studies/ records that were reviewed today include:   Cath 09/04/2012 Procedural Findings: Hemodynamics RA 13 RV 69/15 PA 64/21 mean 37 PCWP a wave 21 v wave 36, mean 21 LV 143/22 AO 143/60 mean 93  Oxygen saturations: PA 72 AO 91  Cardiac Output (Fick) 9.2  Cardiac Index (Fick) 4.3           Coronary angiography: Coronary dominance: right  Left mainstem: Left main is long. There is diffuse 50% mid left main stenosis into 75% distal left main.  Left anterior descending (LAD): The LAD is totally occluded in its proximal aspect.  Left circumflex (LCx): The left circumflex is patent there is diffuse 50% proximal stenosis. There are 2 patent  obtuse marginal branches.  Right coronary artery (RCA): The RCA is heavily calcified and severely diseased. The mid vessel is totally occluded.  Saphenous vein graft to PDA: Widely patent with patent stents in the proximal body of the graft.  Saphenous vein graft to PLA is patent there is mild in-stent restenosis in the proximal aspect of the graft but there is no significant disease noted. The PLA is a large branch with no significant stenosis  LIMA to LAD: Widely patent with no significant stenosis. The native vessel territory is more consistent with the diagonal territory than a true LAD and I suspect the native LAD is an anatomic variant the doesn't reach the left ventricular apex.  Left ventriculography: Deferred because of chronic kidney disease  Final Conclusions:   1. Severe three-vessel native coronary artery disease 2. Continued patency of the saphenous vein graft to PDA, saphenous vein graft to PLA, and LIMA to LAD 3. Moderate pulmonary hypertension likely related to left heart disease 4. Elevated left ventricular and pulmonary wedge pressure with large V waves  Recommendations: Continued medical therapy for treatment of congestive heart failure.    Echo 01/10/2014 - Left ventricle: The cavity size was moderately dilated. Wall thickness was increased in a pattern of moderate LVH. There was focal basal hypertrophy. Systolic function was moderately reduced. The estimated ejection fraction was in the range of 35% to 40%. Moderate hypokinesis. - Mitral valve: Mild regurgitation. - Left atrium: The atrium was mildly dilated. - Right atrium: The atrium was mildly to moderately dilated. - Pulmonary arteries: Systolic pressure was moderately increased. PA peak pressure: 59mm Hg (S).   Cardiac cath 06/17/17:  LEFT HEART CATH AND CORS/GRAFTS ANGIOGRAPHY  Conclusion    Prox RCA to Dist RCA lesion, 100 %stenosed.  Prox LAD lesion, 100 %stenosed.  LIMA graft  was visualized by angiography and is large and anatomically normal.  SVG graft was visualized by angiography and is normal in caliber.  Origin to Prox Graft lesion, 0 %stenosed.  SVG graft was visualized by angiography and is normal in caliber.  Origin lesion, 30 %stenosed.  Origin to Prox Graft lesion, 0 %stenosed.  There is moderate to severe left ventricular systolic dysfunction.  LV end diastolic pressure is normal.   1. Severe 2 vessel occlusive CAD.    -  100% proximal LAD    - 100% mid RCA 2. Patent LIMA to the LAD 3. Patent SVG to PDA 4. Patent SVG to PLOM 5. Moderate to severe LV dysfunction. EF estimated at 35%. 6. Normal LVEDP.  Plan: continue medical therapy. Consider GI referral for evaluation of his pain.    Echo 02/04/20: IMPRESSIONS    1. Left ventricular ejection fraction, by estimation, is 35%%. The left  ventricle has severely decreased function. The left ventricle demonstrates  global hypokinesis. The left ventricular internal cavity size was severely  dilated. Left ventricular  diastolic parameters are indeterminate.  2. Right ventricular systolic function is mildly reduced. The right  ventricular size is mildly enlarged. There is moderately elevated  pulmonary artery systolic pressure.  3. Left atrial size was mild to moderately dilated.  4. Right atrial size was moderately dilated.  5. The mitral valve is abnormal. Mild to moderate mitral valve  regurgitation.  6. The aortic valve is abnormal. Aortic valve regurgitation is mild. Mild  aortic valve sclerosis is present, with no evidence of aortic valve  stenosis.  7. Aortic dilatation noted. There is mild dilatation of the ascending  aorta measuring 41 mm.  8. The inferior vena cava is dilated in size with <50% respiratory  variability, suggesting right atrial pressure of 15 mmHg.   Comparison(s): The left ventricular function is unchanged.   ASSESSMENT:    1. Chronic combined  systolic and diastolic heart failure ([place])   2. Coronary artery disease of bypass graft of native heart with stable angina pectoris ([place])   3. CKD (chronic kidney disease) stage 4, GFR 15-29 ml/min ([facility])   4. Essential hypertension      PLAN:    1.CAD s/p CABG: Negative Myoview on 05/22/2017. Cardiac cath in 2018 showed patent grafts. He is asymptomatic. Continue ASA, statin, Coreg.   2. Chronic combined systolic/diastolic CHF. His volume status looks very good today. Now back on lasix 40 mg bid. He self adjusts if weight/edema increases. Seems to do well when weight around 200 lbs.     3. Hyperlipidemia: On Lipitor 80 mg daily.  4. IDDM: On insulin. Per primary care.  5. HTN controlled.  6. CKD stage IV.   7.  OSA on CPAP: continue current therapy  8. Osteomyelitis right foot. S/p toe amputation.   9. History of gout. On allopurinol.  Follow up in 3 months   Signed, Scott Milbourn Martinique, MD  06/12/2020 11:41 AM    [facility] [address], [place], [place]  [address] Phone: [phone]; Fax: [phone]

## 2020-06-12 ENCOUNTER — Ambulatory Visit: Payer: Medicare HMO | Admitting: Cardiology

## 2020-06-12 ENCOUNTER — Other Ambulatory Visit: Payer: Self-pay

## 2020-06-12 ENCOUNTER — Encounter: Payer: Self-pay | Admitting: Cardiology

## 2020-06-12 VITALS — BP 128/68 | HR 58 | Ht 72.0 in | Wt 198.8 lb

## 2020-06-12 DIAGNOSIS — N184 Chronic kidney disease, stage 4 (severe): Secondary | ICD-10-CM | POA: Diagnosis not present

## 2020-06-12 DIAGNOSIS — I25708 Atherosclerosis of coronary artery bypass graft(s), unspecified, with other forms of angina pectoris: Secondary | ICD-10-CM | POA: Diagnosis not present

## 2020-06-12 DIAGNOSIS — I10 Essential (primary) hypertension: Secondary | ICD-10-CM | POA: Diagnosis not present

## 2020-06-12 DIAGNOSIS — I5042 Chronic combined systolic (congestive) and diastolic (congestive) heart failure: Secondary | ICD-10-CM | POA: Diagnosis not present

## 2020-06-12 MED ORDER — ALPRAZOLAM 0.25 MG PO TABS: ORAL_TABLET | ORAL | 0 refills | Status: DC

## 2020-06-19 DIAGNOSIS — I509 Heart failure, unspecified: Secondary | ICD-10-CM | POA: Diagnosis not present

## 2020-06-19 DIAGNOSIS — I259 Chronic ischemic heart disease, unspecified: Secondary | ICD-10-CM | POA: Diagnosis not present

## 2020-06-19 DIAGNOSIS — R062 Wheezing: Secondary | ICD-10-CM | POA: Diagnosis not present

## 2020-06-19 DIAGNOSIS — I2510 Atherosclerotic heart disease of native coronary artery without angina pectoris: Secondary | ICD-10-CM | POA: Diagnosis not present

## 2020-07-19 DIAGNOSIS — I259 Chronic ischemic heart disease, unspecified: Secondary | ICD-10-CM | POA: Diagnosis not present

## 2020-07-19 DIAGNOSIS — R062 Wheezing: Secondary | ICD-10-CM | POA: Diagnosis not present

## 2020-07-19 DIAGNOSIS — I509 Heart failure, unspecified: Secondary | ICD-10-CM | POA: Diagnosis not present

## 2020-07-19 DIAGNOSIS — I2510 Atherosclerotic heart disease of native coronary artery without angina pectoris: Secondary | ICD-10-CM | POA: Diagnosis not present

## 2020-08-19 DIAGNOSIS — R062 Wheezing: Secondary | ICD-10-CM | POA: Diagnosis not present

## 2020-08-19 DIAGNOSIS — I259 Chronic ischemic heart disease, unspecified: Secondary | ICD-10-CM | POA: Diagnosis not present

## 2020-08-19 DIAGNOSIS — I2510 Atherosclerotic heart disease of native coronary artery without angina pectoris: Secondary | ICD-10-CM | POA: Diagnosis not present

## 2020-08-19 DIAGNOSIS — I509 Heart failure, unspecified: Secondary | ICD-10-CM | POA: Diagnosis not present

## 2020-09-08 NOTE — Progress Notes (Signed)
Cardiology Office Note    Date:  09/12/2020   ID:  Scott [name], DOB 27-Feb-1933, MRN [id number]  PCP:  Reubin Milan, MD  Cardiologist:  Dr. Martinique  Chief Complaint  Patient presents with  . Congestive Heart Failure    History of Present Illness:  Scott [name] is a 86 y.o. male seen for follow up CHF.   He has a  PMH of CAD s/p CABG (LIMA to LAD, SVG to PLA and SVG to PDA), HTN, HLD, chronic combined systolic and diastolic HF, IDDM, stage III CKD, gout, UTI, OSA on CPAP and dietary noncompliance. EF was 25-30% on echocardiogram in December 2013. Cardiac catheterization  on 09/04/2012  showed diffuse 50% mid left main stenosis into 75% distal left main, diffuse 50% left circumflex stenosis, mid RCA was totally occluded, widely patent SVG to PDA, patent SVG to PLA with mild in-stent restenosis in the proximal aspect of the graft, patent LIMA to LAD. Echocardiogram obtained on 01/10/2014 showed EF has improved to 35-40%, moderate LVH, mild MR, PA peak pressure 50 mmHg.   He was  admitted with chest pain on 05/21/2017. Serial troponin was borderline. He eventually underwent Myoview on 05/22/2017 which showed a small area of questionable ischemia versus attenuation artifact but overall felt to be low risk. He was able to ambulate without recurrent symptom limitation. Therefore he was discharged on the same day on 30 mg Imdur, lisinopril was discontinued. He continued to have symptoms so underwent cardiac cath that showed occlusion of the RCA and LAD but all grafts were patent.   He was admitted in January 2019 with gallstone pancreatitis treated with ERCP.   He was admitted in March 2021 with Cellulitis of the right foot with osteomyelitis. Angiogram showed severe anterior tibial disease with occlusion of the peroneal and PT vessels. Treated medically. He did have AKI with creatinine up to 2.86 before declining to 2.17 with IV hydration and holding diuretics. Post DC he noted significant weight gain  and edema and lasix was resumed.   He was seen on 01/17/20. Labs revealed Hgb 11.1. creatinine improved to 1.64. BNP 624. Echo was updated as noted below and was really unchanged from prior. He has seen Dr Oneida Alar and follow up  MRI showed persistent osteomyelitis of the fifth metatarsal.   When seen in May he was volume overloaded. We added metolazone 2.5 mg twice a week to lasix 80 mg bid. On 5/17 he underwent amputation of his right 5th toe. When seen 03/12/20 his weight was down to 203 lbs and he had stopped taking metolazone.   On follow up today he notes increased swelling and weight gain over the past 3 weeks.  Still eats out mostly so it is a challenge to limit his sodium intake. He is taking lasix 40 mg bid and 2 weeks ago resumed metolazone 2.5 mg twice a week. Notes legs hurt more when swollen. Edema does fluctuate day to day - reports it was much worse yesterday. .        Past Medical History:  Diagnosis Date  . Acute biliary pancreatitis 10/2017   with choledocholithiasis.  ERCP, stone extraction performed  . Arthritis   . CAD (coronary artery disease)    a. 1997 CABGx3 (VG->RPDA, VG->PLV, LIMA->LAD);  b. 07/2004 PCI VG->PLV (3.5x16 Taxus DES, 3.5x12 Taxus DES); c. 11/2009 PCI: VG->PLV 3.5x12 Promus DES), VG->RPDA (3.5x15 Promus DES); d. 08/2012 Cath: patent grafts; e. 05/2013 Lexiscan MV: no ischemia/infarction, EF 44%.  . Cancer (  [place]) 2005   colon surgery done  . Cataract   . Chronic combined systolic and diastolic CHF (congestive heart failure) ([place]) 08/2012   a. EF 50-55% 11/2011; b. 08/2012 - EF 25-30%,  with mild RM, mod TR, mod RAE & LAE, moderate reduced RV systolic function and PASP of 81mmHg. - > cath showed patent grafts & Mod Puml HTN wth elevated PCWP;  c. 01/2014 Echo: EF 35-40%, mod LVH, mod HK, mildly dil LA, mod dil RA, PASP 10mmHg.  . CKD (chronic kidney disease), stage III ([place])   . Dyslipidemia   . GERD (gastroesophageal reflux disease)   . Gout   . History of  home oxygen therapy    uses oxygen nightly with cpap   . Hyperkalemia   . Hypertension   . Insulin Dependent Diabetes mellitus    type 2  . Myocardial infarction ([place]) 1997  . PAD (peripheral artery disease) ([place])   . Sleep apnea   . Thrombocytopenia ([place]) 2011   dates back to 2011  . UTI (lower urinary tract infection) 08/2012   E.coli    Past Surgical History:  Procedure Laterality Date  . ABDOMINAL AORTOGRAM W/LOWER EXTREMITY Right 01/04/2020   Abdominal aortogram with right lower extremity runoff  . ABDOMINAL AORTOGRAM W/LOWER EXTREMITY Bilateral 01/04/2020   Procedure: ABDOMINAL AORTOGRAM W/LOWER EXTREMITY;  Surgeon: Elam Dutch, MD;  Location: [facility];  Service: Vascular;  Laterality: Bilateral;  . AMPUTATION Right 02/25/2020   Procedure: RIGHT FIFTH TOE AMPUTATION;  Surgeon: Elam Dutch, MD;  Location: [facility];  Service: Vascular;  Laterality: Right;  . APPENDECTOMY    . BACK SURGERY     15 years ago lower  . CARDIAC CATHETERIZATION  2005   stenting of the vein graft to the posterior lateral per -- Dr. Martinique      . CARDIAC CATHETERIZATION  2011   showed right coronary, totally occluded LAD and severe stenosis in both saphenous vein graft to the posterior lateral and posterior descending  -- stenting of the proximal portion of the SVG to the posterior lateral branch in 2/11  . CHOLECYSTECTOMY    . COLONOSCOPY     x several - colon polyps  . CORONARY ARTERY BYPASS GRAFT  1997    (LIMA to LAD, SVG to PDA, SVG to PL)  . ERCP N/A 10/17/2017    Irene Shipper, MD;  [facility] ENDOSCOPY; choledocholithiasis, biliary pancreatitis.  s/p sphincterotomy and stone extraction.    . ESOPHAGOGASTRODUODENOSCOPY (EGD) WITH PROPOFOL N/A 09/21/2017   Procedure: ESOPHAGOGASTRODUODENOSCOPY (EGD) WITH PROPOFOL;  Surgeon: Doran Stabler, MD;  Location: [facility] ENDOSCOPY;  Service: Gastroenterology;  Laterality: N/A;  . EYE SURGERY     ioc for cataract  . HERNIA REPAIR    . LEFT AND  RIGHT HEART CATHETERIZATION WITH CORONARY/GRAFT ANGIOGRAM  09/04/2012   Procedure: LEFT AND RIGHT HEART CATHETERIZATION WITH Beatrix Fetters;  Surgeon: Sherren Mocha, MD;  Location: [facility] CATH LAB;  patent grafts, mod 2ndary pulmonary HTN, elevated LV EDP & PCWP  . LEFT HEART CATH AND CORS/GRAFTS ANGIOGRAPHY N/A 06/17/2017   Procedure: LEFT HEART CATH AND CORS/GRAFTS ANGIOGRAPHY;  Surgeon: Martinique, Jovian Lembcke M, MD;  Location: [facility];  Service: Cardiovascular;  Laterality: N/A;  . mohs     x several  . PARTIAL COLECTOMY     cancerous polyps  . stent to heart  last done 5 to 6 yrs ago   x 4  . UPPER GI ENDOSCOPY  Current Medications: Outpatient Medications Prior to Visit  Medication Sig Dispense Refill  . allopurinol (ZYLOPRIM) 100 MG tablet Take 1 tablet (100 mg total) by mouth daily. (Patient taking differently: Take 100 mg by mouth 2 (two) times daily. ) 60 tablet 1  . ALPRAZolam (XANAX) 0.25 MG tablet Take 1 tablet at bedtime as needed for sleep 30 tablet 0  . aspirin 81 MG EC tablet Take 81 mg by mouth at bedtime.     Marland Kitchen atorvastatin (LIPITOR) 80 MG tablet Take 80 mg by mouth daily.     Marland Kitchen bismuth subsalicylate (PEPTO BISMOL) 262 MG/15ML suspension Take 30 mLs by mouth every 6 (six) hours as needed for indigestion.    . carvedilol (COREG) 12.5 MG tablet Take 1 tablet (12.5 mg total) by mouth 2 (two) times daily with a meal. 60 tablet 6  . Cholecalciferol (VITAMIN D-3) 25 MCG (1000 UT) CAPS Take 2,000 Units by mouth every other day.     . colchicine 0.6 MG tablet Take 1 tablet (0.6 mg total) by mouth daily as needed. (Patient taking differently: Take 0.6 mg by mouth daily as needed (Gout). ) 60 tablet 0  . ferrous sulfate 325 (65 FE) MG tablet Take 325 mg by mouth every other day.    . fluticasone (FLONASE) 50 MCG/ACT nasal spray Place 2 sprays into both nostrils daily as needed for allergies or rhinitis.    Marland Kitchen insulin aspart protamine- aspart (NOVOLOG MIX 70/30) (70-30) 100  UNIT/ML injection Inject 30 Units into the skin 2 (two) times daily with a meal.     . isosorbide mononitrate (IMDUR) 30 MG 24 hr tablet Take 30 mg by mouth daily.    Marland Kitchen latanoprost (XALATAN) 0.005 % ophthalmic solution Place 1 drop into both eyes at bedtime.    Marland Kitchen losartan (COZAAR) 100 MG tablet Take 100 mg by mouth daily.    Marland Kitchen MITIGARE 0.6 MG CAPS Take 0.6 mg by mouth daily.    . Multiple Vitamin (MULTIVITAMIN) tablet Take 1 tablet by mouth daily.      . nitroGLYCERIN (NITROSTAT) 0.4 MG SL tablet Place 1 tablet (0.4 mg total) under the tongue every 5 (five) minutes as needed for chest pain (up to 3 doses). 25 tablet 3  . pantoprazole (PROTONIX) 40 MG tablet Take 1 tablet (40 mg total) by mouth 2 (two) times daily. (Patient taking differently: Take 40 mg by mouth daily. ) 180 tablet 3  . PRESCRIPTION MEDICATION CPAP: At bedtime    . tamsulosin (FLOMAX) 0.4 MG CAPS capsule Take 0.4 mg by mouth daily after breakfast.     . vancomycin (VANCOCIN) 125 MG capsule Take 1 capsule (125 mg total) by mouth 4 (four) times daily. 40 capsule 0  . furosemide (LASIX) 40 MG tablet Take 2 tablets (80 mg total) by mouth 2 (two) times daily. (Patient taking differently: Take 40 mg by mouth 2 (two) times daily. ) 180 tablet 3  . metolazone (ZAROXOLYN) 2.5 MG tablet Take 1 tablet (2.5 mg total) by mouth daily. Take one tablet twice a week on Monday and Thursday 30 tablet 3   No facility-administered medications prior to visit.     Allergies:   Patient has no known allergies.   Social History   Socioeconomic History  . Marital status: Single    Spouse name: Not on file  . Number of children: 0  . Years of education: Not on file  . Highest education level: Not on file  Occupational History  . Occupation:  retired   ran a Office manager course  Tobacco Use  . Smoking status: Former Smoker    Packs/day: 1.50    Years: 35.00    Pack years: 52.50    Types: Cigarettes    Quit date: 10/12/1983    Years since quitting: 36.9   . Smokeless tobacco: Never Used  Vaping Use  . Vaping Use: Never used  Substance and Sexual Activity  . Alcohol use: Yes    Alcohol/week: 2.0 - 3.0 standard drinks    Types: 2 - 3 Standard drinks or equivalent per week    Comment: 3-4x/wk.  . Drug use: No  . Sexual activity: Not Currently    Birth control/protection: None  Other Topics Concern  . Not on file  Social History Narrative   Lives in [place] by himself.  Active but does not routinely exercise.   Social Determinants of Health   Financial Resource Strain:   . Difficulty of Paying Living Expenses: Not on file  Food Insecurity:   . Worried About Charity fundraiser in the Last Year: Not on file  . Ran Out of Food in the Last Year: Not on file  Transportation Needs:   . Lack of Transportation (Medical): Not on file  . Lack of Transportation (Non-Medical): Not on file  Physical Activity:   . Days of Exercise per Week: Not on file  . Minutes of Exercise per Session: Not on file  Stress:   . Feeling of Stress : Not on file  Social Connections:   . Frequency of Communication with Friends and Family: Not on file  . Frequency of Social Gatherings with Friends and Family: Not on file  . Attends Religious Services: Not on file  . Active Member of Clubs or Organizations: Not on file  . Attends Archivist Meetings: Not on file  . Marital Status: Not on file     Family History:  The patient's family history includes Asthma in his brother; Cardiomyopathy in his mother; Coronary artery disease in his brother and father; Heart attack in his father; Kidney disease in his sister.   ROS:   Please see the history of present illness.    ROS All other systems reviewed and are negative.   PHYSICAL EXAM:   VS:  BP 117/75   Pulse 67   Temp (!) 97.5 F (36.4 C)   Ht 6' (1.829 m)   Wt 215 lb (97.5 kg)   SpO2 95%   BMI 29.16 kg/m    GEN: Well nourished, chronically ill appearing, in no acute distress  HEENT: normal   Neck: no JVD, carotid bruits, or masses Cardiac: RRR; no murmurs, rubs, or gallops, 1+ pitting edema  Respiratory:  clear to auscultation bilaterally, normal work of breathing GI: soft,  distended with old hernia, + BS MS: no deformity or atrophy  Skin: warm and dry, no rash Neuro:  Alert and Oriented x 3, Strength and sensation are intact Psych: euthymic mood, full affect  Wt Readings from Last 3 Encounters:  09/12/20 215 lb (97.5 kg)  06/12/20 198 lb 12.8 oz (90.2 kg)  05/13/20 200 lb (90.7 kg)      Studies/Labs Reviewed:   EKG:  EKG is not ordered today.     Recent Labs: 02/18/2020: BNP 1,303.1 05/08/2020: ALT 18; BUN 45; Creatinine, Ser 2.18; Hemoglobin 12.2; Platelets 103; Potassium 4.5; Sodium 137   Lipid Panel    Component Value Date/Time   CHOL 107 05/22/2017 0359   TRIG  212 (H) 05/22/2017 0359   HDL 36 (L) 05/22/2017 0359   CHOLHDL 3.0 05/22/2017 0359   VLDL 42 (H) 05/22/2017 0359   LDLCALC 29 05/22/2017 0359   LDLDIRECT 29.8 08/06/2013 0849   Dated 01/2920: BUN 48, creatinine 2.15. potassium 5.5.   Additional studies/ records that were reviewed today include:   Cath 09/04/2012 Procedural Findings: Hemodynamics RA 13 RV 69/15 PA 64/21 mean 37 PCWP a wave 21 v wave 36, mean 21 LV 143/22 AO 143/60 mean 93  Oxygen saturations: PA 72 AO 91  Cardiac Output (Fick) 9.2  Cardiac Index (Fick) 4.3           Coronary angiography: Coronary dominance: right  Left mainstem: Left main is long. There is diffuse 50% mid left main stenosis into 75% distal left main.  Left anterior descending (LAD): The LAD is totally occluded in its proximal aspect.  Left circumflex (LCx): The left circumflex is patent there is diffuse 50% proximal stenosis. There are 2 patent obtuse marginal branches.  Right coronary artery (RCA): The RCA is heavily calcified and severely diseased. The mid vessel is totally occluded.  Saphenous vein graft to PDA: Widely patent with  patent stents in the proximal body of the graft.  Saphenous vein graft to PLA is patent there is mild in-stent restenosis in the proximal aspect of the graft but there is no significant disease noted. The PLA is a large branch with no significant stenosis  LIMA to LAD: Widely patent with no significant stenosis. The native vessel territory is more consistent with the diagonal territory than a true LAD and I suspect the native LAD is an anatomic variant the doesn't reach the left ventricular apex.  Left ventriculography: Deferred because of chronic kidney disease  Final Conclusions:   1. Severe three-vessel native coronary artery disease 2. Continued patency of the saphenous vein graft to PDA, saphenous vein graft to PLA, and LIMA to LAD 3. Moderate pulmonary hypertension likely related to left heart disease 4. Elevated left ventricular and pulmonary wedge pressure with large V waves  Recommendations: Continued medical therapy for treatment of congestive heart failure.    Echo 01/10/2014 - Left ventricle: The cavity size was moderately dilated. Wall thickness was increased in a pattern of moderate LVH. There was focal basal hypertrophy. Systolic function was moderately reduced. The estimated ejection fraction was in the range of 35% to 40%. Moderate hypokinesis. - Mitral valve: Mild regurgitation. - Left atrium: The atrium was mildly dilated. - Right atrium: The atrium was mildly to moderately dilated. - Pulmonary arteries: Systolic pressure was moderately increased. PA peak pressure: 44mm Hg (S).   Cardiac cath 06/17/17:  LEFT HEART CATH AND CORS/GRAFTS ANGIOGRAPHY  Conclusion    Prox RCA to Dist RCA lesion, 100 %stenosed.  Prox LAD lesion, 100 %stenosed.  LIMA graft was visualized by angiography and is large and anatomically normal.  SVG graft was visualized by angiography and is normal in caliber.  Origin to Prox Graft lesion, 0 %stenosed.  SVG graft was  visualized by angiography and is normal in caliber.  Origin lesion, 30 %stenosed.  Origin to Prox Graft lesion, 0 %stenosed.  There is moderate to severe left ventricular systolic dysfunction.  LV end diastolic pressure is normal.   1. Severe 2 vessel occlusive CAD.    - 100% proximal LAD    - 100% mid RCA 2. Patent LIMA to the LAD 3. Patent SVG to PDA 4. Patent SVG to PLOM 5. Moderate to severe LV dysfunction.  EF estimated at 35%. 6. Normal LVEDP.  Plan: continue medical therapy. Consider GI referral for evaluation of his pain.    Echo 02/04/20: IMPRESSIONS    1. Left ventricular ejection fraction, by estimation, is 35%%. The left  ventricle has severely decreased function. The left ventricle demonstrates  global hypokinesis. The left ventricular internal cavity size was severely  dilated. Left ventricular  diastolic parameters are indeterminate.  2. Right ventricular systolic function is mildly reduced. The right  ventricular size is mildly enlarged. There is moderately elevated  pulmonary artery systolic pressure.  3. Left atrial size was mild to moderately dilated.  4. Right atrial size was moderately dilated.  5. The mitral valve is abnormal. Mild to moderate mitral valve  regurgitation.  6. The aortic valve is abnormal. Aortic valve regurgitation is mild. Mild  aortic valve sclerosis is present, with no evidence of aortic valve  stenosis.  7. Aortic dilatation noted. There is mild dilatation of the ascending  aorta measuring 41 mm.  8. The inferior vena cava is dilated in size with <50% respiratory  variability, suggesting right atrial pressure of 15 mmHg.   Comparison(s): The left ventricular function is unchanged.   ASSESSMENT:    1. Chronic combined systolic and diastolic heart failure ([place])   2. Coronary artery disease of bypass graft of native heart with stable angina pectoris ([place])   3. CKD (chronic kidney disease) stage 4, GFR 15-29 ml/min  ([facility])   4. Essential hypertension      PLAN:    1.CAD s/p CABG: Negative Myoview on 05/22/2017. Cardiac cath in 2018 showed patent grafts. He is asymptomatic. Continue ASA, statin, Coreg.   2. Chronic combined systolic/diastolic CHF. He is volume overloaded today. Weight is up 15 lbs and he is edematous.  Recommend switching lasix to torsemide 40 mg bid. Continue metolazone 2.5 mg twice a week. Will check chemistries and BNP today. Follow up in 4 weeks.    3. Hyperlipidemia: On Lipitor 80 mg daily. Will check LFTs and lipid panel.   4. IDDM: On insulin. Per primary care.  5. HTN controlled.  6. CKD stage IV. Watch closely with diuretic therapy  7.  OSA on CPAP: continue current therapy  8. Osteomyelitis right foot. S/p toe amputation.   9. History of gout. On allopurinol.  10. PAD with severe tibial disease. Managed by Dr Oneida Alar.     Signed, Riggs Dineen Martinique, MD  09/12/2020 12:09 PM    [facility] [address], [place], [place]  [address] Phone: [phone]; Fax: [phone]

## 2020-09-12 ENCOUNTER — Encounter: Payer: Self-pay | Admitting: Cardiology

## 2020-09-12 ENCOUNTER — Other Ambulatory Visit: Payer: Self-pay

## 2020-09-12 ENCOUNTER — Ambulatory Visit: Payer: Medicare HMO | Admitting: Cardiology

## 2020-09-12 VITALS — BP 117/75 | HR 67 | Temp 97.5°F | Ht 72.0 in | Wt 215.0 lb

## 2020-09-12 DIAGNOSIS — I5042 Chronic combined systolic (congestive) and diastolic (congestive) heart failure: Secondary | ICD-10-CM

## 2020-09-12 DIAGNOSIS — I25708 Atherosclerosis of coronary artery bypass graft(s), unspecified, with other forms of angina pectoris: Secondary | ICD-10-CM

## 2020-09-12 DIAGNOSIS — N184 Chronic kidney disease, stage 4 (severe): Secondary | ICD-10-CM | POA: Diagnosis not present

## 2020-09-12 DIAGNOSIS — I10 Essential (primary) hypertension: Secondary | ICD-10-CM

## 2020-09-12 MED ORDER — TORSEMIDE 20 MG PO TABS
40.0000 mg | ORAL_TABLET | Freq: Two times a day (BID) | ORAL | 3 refills | Status: DC
Start: 2020-09-12 — End: 2021-03-02

## 2020-09-12 MED ORDER — ALPRAZOLAM 0.25 MG PO TABS: ORAL_TABLET | ORAL | 1 refills | Status: DC

## 2020-09-12 NOTE — Patient Instructions (Signed)
Switch furosemide (Lasix) to Torsemide 40 mg twice a day  Continue metolazone 2.5 mg twice a week  We will check lab work today.  Follow up 4 weeks.

## 2020-09-13 LAB — COMPREHENSIVE METABOLIC PANEL
ALT: 16 IU/L (ref 0–44)
AST: 17 IU/L (ref 0–40)
Albumin/Globulin Ratio: 1.1 — ABNORMAL LOW (ref 1.2–2.2)
Albumin: 4 g/dL (ref 3.6–4.6)
Alkaline Phosphatase: 122 IU/L — ABNORMAL HIGH (ref 44–121)
BUN/Creatinine Ratio: 31 — ABNORMAL HIGH (ref 10–24)
BUN: 90 mg/dL (ref 8–27)
Bilirubin Total: 1 mg/dL (ref 0.0–1.2)
CO2: 22 mmol/L (ref 20–29)
Calcium: 8.8 mg/dL (ref 8.6–10.2)
Chloride: 100 mmol/L (ref 96–106)
Creatinine, Ser: 2.9 mg/dL — ABNORMAL HIGH (ref 0.76–1.27)
GFR calc Af Amer: 22 mL/min/{1.73_m2} — ABNORMAL LOW (ref >59)
GFR calc non Af Amer: 19 mL/min/{1.73_m2} — ABNORMAL LOW (ref >59)
Globulin, Total: 3.6 g/dL (ref 1.5–4.5)
Glucose: 200 mg/dL — ABNORMAL HIGH (ref 65–99)
Potassium: 5.6 mmol/L — ABNORMAL HIGH (ref 3.5–5.2)
Sodium: 137 mmol/L (ref 134–144)
Total Protein: 7.6 g/dL (ref 6.0–8.5)

## 2020-09-13 LAB — LIPID PANEL
Chol/HDL Ratio: 1.8 ratio (ref 0.0–5.0)
Cholesterol, Total: 72 mg/dL — ABNORMAL LOW (ref 100–199)
HDL: 40 mg/dL (ref >39)
LDL Chol Calc (NIH): 19 mg/dL (ref 0–99)
Triglycerides: 49 mg/dL (ref 0–149)
VLDL Cholesterol Cal: 13 mg/dL (ref 5–40)

## 2020-09-13 LAB — BRAIN NATRIURETIC PEPTIDE: BNP: 796.8 pg/mL — ABNORMAL HIGH (ref 0.0–100.0)

## 2020-09-15 ENCOUNTER — Telehealth: Payer: Self-pay | Admitting: Cardiology

## 2020-09-15 ENCOUNTER — Other Ambulatory Visit: Payer: Self-pay

## 2020-09-15 DIAGNOSIS — R7989 Other specified abnormal findings of blood chemistry: Secondary | ICD-10-CM

## 2020-09-15 DIAGNOSIS — R799 Abnormal finding of blood chemistry, unspecified: Secondary | ICD-10-CM

## 2020-09-15 NOTE — Addendum Note (Signed)
Addended by: Kathyrn Lass on: 09/15/2020 01:59 PM   Modules accepted: Orders

## 2020-09-15 NOTE — Telephone Encounter (Signed)
Patient is returning Cheryl's call in regards to lab results.

## 2020-09-15 NOTE — Telephone Encounter (Signed)
Spoke to patient lab results given. 

## 2020-09-18 DIAGNOSIS — I509 Heart failure, unspecified: Secondary | ICD-10-CM | POA: Diagnosis not present

## 2020-09-18 DIAGNOSIS — I259 Chronic ischemic heart disease, unspecified: Secondary | ICD-10-CM | POA: Diagnosis not present

## 2020-09-18 DIAGNOSIS — R062 Wheezing: Secondary | ICD-10-CM | POA: Diagnosis not present

## 2020-09-18 DIAGNOSIS — I2510 Atherosclerotic heart disease of native coronary artery without angina pectoris: Secondary | ICD-10-CM | POA: Diagnosis not present

## 2020-09-23 DIAGNOSIS — R799 Abnormal finding of blood chemistry, unspecified: Secondary | ICD-10-CM | POA: Diagnosis not present

## 2020-09-23 DIAGNOSIS — R7989 Other specified abnormal findings of blood chemistry: Secondary | ICD-10-CM | POA: Diagnosis not present

## 2020-09-24 ENCOUNTER — Ambulatory Visit: Payer: Medicare HMO | Admitting: Physician Assistant

## 2020-09-24 ENCOUNTER — Ambulatory Visit (HOSPITAL_COMMUNITY)
Admission: RE | Admit: 2020-09-24 | Discharge: 2020-09-24 | Disposition: A | Discharge status: Home / Self Care | Payer: Medicare HMO | Source: Ambulatory Visit | Attending: Physician Assistant | Admitting: Physician Assistant

## 2020-09-24 ENCOUNTER — Other Ambulatory Visit: Payer: Self-pay

## 2020-09-24 VITALS — BP 138/68 | HR 65 | Temp 98.1°F | Resp 20 | Ht 72.0 in | Wt 198.6 lb

## 2020-09-24 DIAGNOSIS — I739 Peripheral vascular disease, unspecified: Secondary | ICD-10-CM | POA: Insufficient documentation

## 2020-09-24 DIAGNOSIS — L03031 Cellulitis of right toe: Secondary | ICD-10-CM

## 2020-09-24 LAB — BASIC METABOLIC PANEL
BUN/Creatinine Ratio: 37 — ABNORMAL HIGH (ref 10–24)
BUN: 82 mg/dL (ref 8–27)
CO2: 26 mmol/L (ref 20–29)
Calcium: 9 mg/dL (ref 8.6–10.2)
Chloride: 93 mmol/L — ABNORMAL LOW (ref 96–106)
Creatinine, Ser: 2.24 mg/dL — ABNORMAL HIGH (ref 0.76–1.27)
GFR calc Af Amer: 30 mL/min/{1.73_m2} — ABNORMAL LOW (ref >59)
GFR calc non Af Amer: 26 mL/min/{1.73_m2} — ABNORMAL LOW (ref >59)
Glucose: 237 mg/dL — ABNORMAL HIGH (ref 65–99)
Potassium: 4.6 mmol/L (ref 3.5–5.2)
Sodium: 135 mmol/L (ref 134–144)

## 2020-09-24 MED ORDER — CEPHALEXIN 500 MG PO CAPS: 500.0000 mg | ORAL_CAPSULE | Freq: Three times a day (TID) | ORAL | 0 refills | Status: DC

## 2020-09-24 NOTE — Progress Notes (Signed)
History of Present Illness:  Patient is a 86 y.o. year old male who presents for evaluation of PAD.   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   He may need angioplasty of the origin of his right anterior tibial artery. On 02/25/2020 Dr. Oneida Alar amputatedright fifth toe with resection of metatarsal head .   He was last seen in our office for post op f/u.  The fith toe amputation appeared viable at his last visit.  He states that over the last 4-5 weeks he has had increased swelling, edema and erythema in the right LE.  He trimmed his toe nails and caused some bleeding on the tips of the toes on the right foot.  He put a dressing over the toes.  This has now caused the 4th toe and GT to have new wounds.  He states the pain can get so bad that he can hardly walk.        Past Medical History:  Diagnosis Date  . Acute biliary pancreatitis 10/2017   with choledocholithiasis.  ERCP, stone extraction performed  . Arthritis   . CAD (coronary artery disease)    a. 1997 CABGx3 (VG->RPDA, VG->PLV, LIMA->LAD);  b. 07/2004 PCI VG->PLV (3.5x16 Taxus DES, 3.5x12 Taxus DES); c. 11/2009 PCI: VG->PLV 3.5x12 Promus DES), VG->RPDA (3.5x15 Promus DES); d. 08/2012 Cath: patent grafts; e. 05/2013 Lexiscan MV: no ischemia/infarction, EF 44%.  . Cancer [facility]) 2005   colon surgery done  . Cataract   . Chronic combined systolic and diastolic CHF (congestive heart failure) ([place]) 08/2012   a. EF 50-55% 11/2011; b. 08/2012 - EF 25-30%,  with mild RM, mod TR, mod RAE & LAE, moderate reduced RV systolic function and PASP of 39mmHg. - > cath showed patent grafts & Mod Puml HTN wth elevated PCWP;  c. 01/2014 Echo: EF 35-40%, mod LVH, mod HK, mildly dil LA, mod dil RA, PASP 49mmHg.  . CKD (chronic kidney disease), stage III ([place])   . Dyslipidemia   . GERD (gastroesophageal reflux disease)   . Gout   . History of home oxygen therapy     uses oxygen nightly with cpap   . Hyperkalemia   . Hypertension   . Insulin Dependent Diabetes mellitus    type 2  . Myocardial infarction ([place]) 1997  . PAD (peripheral artery disease) ([place])   . Sleep apnea   . Thrombocytopenia ([place]) 2011   dates back to 2011  . UTI (lower urinary tract infection) 08/2012   E.coli    Past Surgical History:  Procedure Laterality Date  . ABDOMINAL AORTOGRAM W/LOWER EXTREMITY Right 01/04/2020   Abdominal aortogram with right lower extremity runoff  . ABDOMINAL AORTOGRAM W/LOWER EXTREMITY Bilateral 01/04/2020   Procedure: ABDOMINAL AORTOGRAM W/LOWER EXTREMITY;  Surgeon: Elam Dutch, MD;  Location: [facility];  Service: Vascular;  Laterality: Bilateral;  . AMPUTATION Right 02/25/2020   Procedure: RIGHT FIFTH TOE AMPUTATION;  Surgeon: Elam Dutch, MD;  Location: [facility];  Service: Vascular;  Laterality: Right;  . APPENDECTOMY    . BACK SURGERY     15 years ago lower  . CARDIAC CATHETERIZATION  2005   stenting of the vein graft to the posterior lateral per -- Dr. Martinique      . CARDIAC CATHETERIZATION  2011   showed right coronary, totally occluded LAD and severe stenosis in both saphenous vein graft to the posterior lateral and  posterior descending  -- stenting of the proximal portion of the SVG to the posterior lateral branch in 2/11  . CHOLECYSTECTOMY    . COLONOSCOPY     x several - colon polyps  . CORONARY ARTERY BYPASS GRAFT  1997    (LIMA to LAD, SVG to PDA, SVG to PL)  . ERCP N/A 10/17/2017    Irene Shipper, MD;  [facility] ENDOSCOPY; choledocholithiasis, biliary pancreatitis.  s/p sphincterotomy and stone extraction.    . ESOPHAGOGASTRODUODENOSCOPY (EGD) WITH PROPOFOL N/A 09/21/2017   Procedure: ESOPHAGOGASTRODUODENOSCOPY (EGD) WITH PROPOFOL;  Surgeon: Doran Stabler, MD;  Location: [facility]OSCOPY;  Service: Gastroenterology;  Laterality: N/A;  . EYE SURGERY     ioc for cataract  . HERNIA REPAIR    . LEFT AND RIGHT HEART  CATHETERIZATION WITH CORONARY/GRAFT ANGIOGRAM  09/04/2012   Procedure: LEFT AND RIGHT HEART CATHETERIZATION WITH Beatrix Fetters;  Surgeon: Sherren Mocha, MD;  Location: [facility] CATH LAB;  patent grafts, mod 2ndary pulmonary HTN, elevated LV EDP & PCWP  . LEFT HEART CATH AND CORS/GRAFTS ANGIOGRAPHY N/A 06/17/2017   Procedure: LEFT HEART CATH AND CORS/GRAFTS ANGIOGRAPHY;  Surgeon: Martinique, Peter M, MD;  Location: [facility];  Service: Cardiovascular;  Laterality: N/A;  . mohs     x several  . PARTIAL COLECTOMY     cancerous polyps  . stent to heart  last done 5 to 6 yrs ago   x 4  . UPPER GI ENDOSCOPY      ROS:   General:  No weight loss, Fever, chills  HEENT: No recent headaches, no nasal bleeding, no visual changes, no sore throat  Neurologic: No dizziness, blackouts, seizures. No recent symptoms of stroke or mini- stroke. No recent episodes of slurred speech, or temporary blindness.  Cardiac: No recent episodes of chest pain/pressure, no shortness of breath at rest.  No shortness of breath with exertion.  Denies history of atrial fibrillation or irregular heartbeat  Vascular: No history of rest pain in feet.  No history of claudication.  positive history of non-healing ulcer, No history of DVT   Pulmonary: No home oxygen, no productive cough, no hemoptysis,  No asthma or wheezing  Musculoskeletal:  [ ]  Arthritis, [ ]  Low back pain,  [ ]  Joint pain  Hematologic:No history of hypercoagulable state.  No history of easy bleeding.  No history of anemia  Gastrointestinal: No hematochezia or melena,  No gastroesophageal reflux, no trouble swallowing  Urinary: [x ] chronic Kidney disease, [ ]  on HD - [ ]  MWF or [ ]  TTHS, [ ]  Burning with urination, [ ]  Frequent urination, [ ]  Difficulty urinating;   Skin: No rashes  Psychological: No history of anxiety,  No history of depression  Social History Social History   Tobacco Use  . Smoking status: Former Smoker    Packs/day:  1.50    Years: 35.00    Pack years: 52.50    Types: Cigarettes    Quit date: 10/12/1983    Years since quitting: 36.9  . Smokeless tobacco: Never Used  Vaping Use  . Vaping Use: Never used  Substance Use Topics  . Alcohol use: Yes    Alcohol/week: 2.0 - 3.0 standard drinks    Types: 2 - 3 Standard drinks or equivalent per week    Comment: 3-4x/wk.  . Drug use: No    Family History Family History  Problem Relation Age of Onset  . Coronary artery disease Father   . Heart  attack Father   . Coronary artery disease Brother        1/2 brother   . Kidney disease Sister   . Cardiomyopathy Mother   . Asthma Brother     Allergies  No Known Allergies   Current Outpatient Medications  Medication Sig Dispense Refill  . allopurinol (ZYLOPRIM) 100 MG tablet Take 1 tablet (100 mg total) by mouth daily. (Patient taking differently: Take 100 mg by mouth 2 (two) times daily.) 60 tablet 1  . ALPRAZolam (XANAX) 0.25 MG tablet Take 1 tablet at bedtime as needed for sleep 30 tablet 1  . aspirin 81 MG EC tablet Take 81 mg by mouth at bedtime.    Marland Kitchen atorvastatin (LIPITOR) 80 MG tablet Take 80 mg by mouth daily.     Marland Kitchen bismuth subsalicylate (PEPTO BISMOL) 262 MG/15ML suspension Take 30 mLs by mouth every 6 (six) hours as needed for indigestion.    . carvedilol (COREG) 12.5 MG tablet Take 1 tablet (12.5 mg total) by mouth 2 (two) times daily with a meal. 60 tablet 6  . Cholecalciferol (VITAMIN D-3) 25 MCG (1000 UT) CAPS Take 2,000 Units by mouth every other day.     . colchicine 0.6 MG tablet Take 1 tablet (0.6 mg total) by mouth daily as needed. (Patient taking differently: Take 0.6 mg by mouth daily as needed (Gout).) 60 tablet 0  . ferrous sulfate 325 (65 FE) MG tablet Take 325 mg by mouth every other day.    . fluticasone (FLONASE) 50 MCG/ACT nasal spray Place 2 sprays into both nostrils daily as needed for allergies or rhinitis.    Marland Kitchen insulin aspart protamine- aspart (NOVOLOG MIX 70/30) (70-30)  100 UNIT/ML injection Inject 30 Units into the skin 2 (two) times daily with a meal.     . isosorbide mononitrate (IMDUR) 30 MG 24 hr tablet Take 30 mg by mouth daily.    Marland Kitchen latanoprost (XALATAN) 0.005 % ophthalmic solution Place 1 drop into both eyes at bedtime.    Marland Kitchen losartan (COZAAR) 100 MG tablet Take 100 mg by mouth daily.    Marland Kitchen MITIGARE 0.6 MG CAPS Take 0.6 mg by mouth daily.    . Multiple Vitamin (MULTIVITAMIN) tablet Take 1 tablet by mouth daily.    . nitroGLYCERIN (NITROSTAT) 0.4 MG SL tablet Place 1 tablet (0.4 mg total) under the tongue every 5 (five) minutes as needed for chest pain (up to 3 doses). 25 tablet 3  . pantoprazole (PROTONIX) 40 MG tablet Take 1 tablet (40 mg total) by mouth 2 (two) times daily. (Patient taking differently: Take 40 mg by mouth daily.) 180 tablet 3  . PRESCRIPTION MEDICATION CPAP: At bedtime    . tamsulosin (FLOMAX) 0.4 MG CAPS capsule Take 0.4 mg by mouth daily after breakfast.     . torsemide (DEMADEX) 20 MG tablet Take 2 tablets (40 mg total) by mouth 2 (two) times daily. 360 tablet 3  . vancomycin (VANCOCIN) 125 MG capsule Take 1 capsule (125 mg total) by mouth 4 (four) times daily. 40 capsule 0  . cephALEXin (KEFLEX) 500 MG capsule Take 1 capsule (500 mg total) by mouth 3 (three) times daily. 21 capsule 0  . metolazone (ZAROXOLYN) 2.5 MG tablet Take 1 tablet (2.5 mg total) by mouth daily. Take one tablet twice a week on Monday and Thursday 30 tablet 3   No current facility-administered medications for this visit.    Physical Examination  Vitals:   09/24/20 0840  BP: 138/68  Pulse:  65  Resp: 20  Temp: 98.1 F (36.7 C)  TempSrc: Temporal  SpO2: 99%  Weight: 198 lb 9.6 oz (90.1 kg)  Height: 6' (1.829 m)    Body mass index is 26.94 kg/m.  General:  Alert and oriented, no acute distress HEENT: Normal Neck: No bruit or JVD Pulmonary: Clear to auscultation bilaterally Cardiac: Regular Rate and Rhythm without murmur Abdomen: Soft, non-tender,  non-distended, no mass, no scars Skin: No rash          Extremity Pulses:  2+ radial, brachial, femoral,  No dorsalis pedis, posterior tibial pulses bilaterally Musculoskeletal: No deformity right LE edema  Neurologic: Upper and lower extremity motor 5/5 and symmetric  DATA:    ABI Findings:  +---------+------------------+-----+----------+--------+  Right  Rt Pressure (mmHg)IndexWaveform Comment   +---------+------------------+-----+----------+--------+  Brachial 128                      +---------+------------------+-----+----------+--------+  PTA   77        0.57 monophasic      +---------+------------------+-----+----------+--------+  DP    171        1.28 monophasic      +---------+------------------+-----+----------+--------+  Great Toe35        0.26 Abnormal       +---------+------------------+-----+----------+--------+   +---------+------------------+-----+----------+-------+  Left   Lt Pressure (mmHg)IndexWaveform Comment  +---------+------------------+-----+----------+-------+  Brachial 134                      +---------+------------------+-----+----------+-------+  PTA   138        1.03 monophasic      +---------+------------------+-----+----------+-------+  DP    181        1.35 monophasic      +---------+------------------+-----+----------+-------+  Great Toe77        0.57 Abnormal       +---------+------------------+-----+----------+-------+   +-------+-----------+-----------+------------+------------+  ABI/TBIToday's ABIToday's TBIPrevious ABIPrevious TBI  +-------+-----------+-----------+------------+------------+  Right [place]     0.26    0.55    0.21      +-------+-----------+-----------+------------+------------+  Left  [place]     0.57     1.08    0.71      +-------+-----------+-----------+------------+------------+       Summary:  Right: Resting right ankle-brachial index indicates noncompressible right  lower extremity arteries. The right toe-brachial index is abnormal.   Left: Resting left ankle-brachial index indicates noncompressible left  lower extremity arteries. The left toe-brachial index is abnormal.    ASSESSMENT:  Known PAD with history of 5th toe amputation.   Now he has developed cellulitis with right 4th toe ischemic changes, GT 1 cm plantar wound. 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 The ABI's Are monophasic with mod to sever disease.   PLAN: I will order Keflex 500 mg TID for the cellulitis and he will be scheduled for 4th toe amputation with Dr. Oneida Alar.  Recommended elevation of the right foot when at rest.     [name] PA-C [facility]: [phone]  MD in office [place]

## 2020-09-24 NOTE — H&P (View-Only) (Signed)
History of Present Illness:  Patient is a 86 y.o. year old male who presents for evaluation of PAD.   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   He may need angioplasty of the origin of his right anterior tibial artery. On 02/25/2020 Dr. Oneida Alar amputatedright fifth toe with resection of metatarsal head .   He was last seen in our office for post op f/u.  The fith toe amputation appeared viable at his last visit.  He states that over the last 4-5 weeks he has had increased swelling, edema and erythema in the right LE.  He trimmed his toe nails and caused some bleeding on the tips of the toes on the right foot.  He put a dressing over the toes.  This has now caused the 4th toe and GT to have new wounds.  He states the pain can get so bad that he can hardly walk.        Past Medical History:  Diagnosis Date  . Acute biliary pancreatitis 10/2017   with choledocholithiasis.  ERCP, stone extraction performed  . Arthritis   . CAD (coronary artery disease)    a. 1997 CABGx3 (VG->RPDA, VG->PLV, LIMA->LAD);  b. 07/2004 PCI VG->PLV (3.5x16 Taxus DES, 3.5x12 Taxus DES); c. 11/2009 PCI: VG->PLV 3.5x12 Promus DES), VG->RPDA (3.5x15 Promus DES); d. 08/2012 Cath: patent grafts; e. 05/2013 Lexiscan MV: no ischemia/infarction, EF 44%.  . Cancer [facility]) 2005   colon surgery done  . Cataract   . Chronic combined systolic and diastolic CHF (congestive heart failure) ([place]) 08/2012   a. EF 50-55% 11/2011; b. 08/2012 - EF 25-30%,  with mild RM, mod TR, mod RAE & LAE, moderate reduced RV systolic function and PASP of 36mmHg. - > cath showed patent grafts & Mod Puml HTN wth elevated PCWP;  c. 01/2014 Echo: EF 35-40%, mod LVH, mod HK, mildly dil LA, mod dil RA, PASP 19mmHg.  . CKD (chronic kidney disease), stage III ([place])   . Dyslipidemia   . GERD (gastroesophageal reflux disease)   . Gout   . History of home oxygen therapy     uses oxygen nightly with cpap   . Hyperkalemia   . Hypertension   . Insulin Dependent Diabetes mellitus    type 2  . Myocardial infarction ([place]) 1997  . PAD (peripheral artery disease) ([place])   . Sleep apnea   . Thrombocytopenia ([place]) 2011   dates back to 2011  . UTI (lower urinary tract infection) 08/2012   E.coli    Past Surgical History:  Procedure Laterality Date  . ABDOMINAL AORTOGRAM W/LOWER EXTREMITY Right 01/04/2020   Abdominal aortogram with right lower extremity runoff  . ABDOMINAL AORTOGRAM W/LOWER EXTREMITY Bilateral 01/04/2020   Procedure: ABDOMINAL AORTOGRAM W/LOWER EXTREMITY;  Surgeon: Elam Dutch, MD;  Location: [place] [facility] LAB;  Service: Vascular;  Laterality: Bilateral;  . AMPUTATION Right 02/25/2020   Procedure: RIGHT FIFTH TOE AMPUTATION;  Surgeon: Elam Dutch, MD;  Location: [place];  Service: Vascular;  Laterality: Right;  . APPENDECTOMY    . BACK SURGERY     15 years ago lower  . CARDIAC CATHETERIZATION  2005   stenting of the vein graft to the posterior lateral per -- Dr. Martinique      . CARDIAC CATHETERIZATION  2011   showed right coronary, totally occluded LAD and severe stenosis in both saphenous vein graft to the posterior lateral and  posterior descending  -- stenting of the proximal portion of the SVG to the posterior lateral branch in 2/11  . CHOLECYSTECTOMY    . COLONOSCOPY     x several - colon polyps  . CORONARY ARTERY BYPASS GRAFT  1997    (LIMA to LAD, SVG to PDA, SVG to PL)  . ERCP N/A 10/17/2017    Irene Shipper, MD;  [facility] ENDOSCOPY; choledocholithiasis, biliary pancreatitis.  s/p sphincterotomy and stone extraction.    . ESOPHAGOGASTRODUODENOSCOPY (EGD) WITH PROPOFOL N/A 09/21/2017   Procedure: ESOPHAGOGASTRODUODENOSCOPY (EGD) WITH PROPOFOL;  Surgeon: Doran Stabler, MD;  Location: [facility] ENDOSCOPY;  Service: Gastroenterology;  Laterality: N/A;  . EYE SURGERY     ioc for cataract  . HERNIA REPAIR    . LEFT AND RIGHT HEART  CATHETERIZATION WITH CORONARY/GRAFT ANGIOGRAM  09/04/2012   Procedure: LEFT AND RIGHT HEART CATHETERIZATION WITH Beatrix Fetters;  Surgeon: Sherren Mocha, MD;  Location: [facility] CATH LAB;  patent grafts, mod 2ndary pulmonary HTN, elevated LV EDP & PCWP  . LEFT HEART CATH AND CORS/GRAFTS ANGIOGRAPHY N/A 06/17/2017   Procedure: LEFT HEART CATH AND CORS/GRAFTS ANGIOGRAPHY;  Surgeon: Martinique, Peter M, MD;  Location: [place] [facility] LAB;  Service: Cardiovascular;  Laterality: N/A;  . mohs     x several  . PARTIAL COLECTOMY     cancerous polyps  . stent to heart  last done 5 to 6 yrs ago   x 4  . UPPER GI ENDOSCOPY      ROS:   General:  No weight loss, Fever, chills  HEENT: No recent headaches, no nasal bleeding, no visual changes, no sore throat  Neurologic: No dizziness, blackouts, seizures. No recent symptoms of stroke or mini- stroke. No recent episodes of slurred speech, or temporary blindness.  Cardiac: No recent episodes of chest pain/pressure, no shortness of breath at rest.  No shortness of breath with exertion.  Denies history of atrial fibrillation or irregular heartbeat  Vascular: No history of rest pain in feet.  No history of claudication.  positive history of non-healing ulcer, No history of DVT   Pulmonary: No home oxygen, no productive cough, no hemoptysis,  No asthma or wheezing  Musculoskeletal:  [ ]  Arthritis, [ ]  Low back pain,  [ ]  Joint pain  Hematologic:No history of hypercoagulable state.  No history of easy bleeding.  No history of anemia  Gastrointestinal: No hematochezia or melena,  No gastroesophageal reflux, no trouble swallowing  Urinary: [x ] chronic Kidney disease, [ ]  on HD - [ ]  MWF or [ ]  TTHS, [ ]  Burning with urination, [ ]  Frequent urination, [ ]  Difficulty urinating;   Skin: No rashes  Psychological: No history of anxiety,  No history of depression  Social History Social History   Tobacco Use  . Smoking status: Former Smoker    Packs/day:  1.50    Years: 35.00    Pack years: 52.50    Types: Cigarettes    Quit date: 10/12/1983    Years since quitting: 36.9  . Smokeless tobacco: Never Used  Vaping Use  . Vaping Use: Never used  Substance Use Topics  . Alcohol use: Yes    Alcohol/week: 2.0 - 3.0 standard drinks    Types: 2 - 3 Standard drinks or equivalent per week    Comment: 3-4x/wk.  . Drug use: No    Family History Family History  Problem Relation Age of Onset  . Coronary artery disease Father   . Heart  attack Father   . Coronary artery disease Brother        1/2 brother   . Kidney disease Sister   . Cardiomyopathy Mother   . Asthma Brother     Allergies  No Known Allergies   Current Outpatient Medications  Medication Sig Dispense Refill  . allopurinol (ZYLOPRIM) 100 MG tablet Take 1 tablet (100 mg total) by mouth daily. (Patient taking differently: Take 100 mg by mouth 2 (two) times daily.) 60 tablet 1  . ALPRAZolam (XANAX) 0.25 MG tablet Take 1 tablet at bedtime as needed for sleep 30 tablet 1  . aspirin 81 MG EC tablet Take 81 mg by mouth at bedtime.    Marland Kitchen atorvastatin (LIPITOR) 80 MG tablet Take 80 mg by mouth daily.     Marland Kitchen bismuth subsalicylate (PEPTO BISMOL) 262 MG/15ML suspension Take 30 mLs by mouth every 6 (six) hours as needed for indigestion.    . carvedilol (COREG) 12.5 MG tablet Take 1 tablet (12.5 mg total) by mouth 2 (two) times daily with a meal. 60 tablet 6  . Cholecalciferol (VITAMIN D-3) 25 MCG (1000 UT) CAPS Take 2,000 Units by mouth every other day.     . colchicine 0.6 MG tablet Take 1 tablet (0.6 mg total) by mouth daily as needed. (Patient taking differently: Take 0.6 mg by mouth daily as needed (Gout).) 60 tablet 0  . ferrous sulfate 325 (65 FE) MG tablet Take 325 mg by mouth every other day.    . fluticasone (FLONASE) 50 MCG/ACT nasal spray Place 2 sprays into both nostrils daily as needed for allergies or rhinitis.    Marland Kitchen insulin aspart protamine- aspart (NOVOLOG MIX 70/30) (70-30)  100 UNIT/ML injection Inject 30 Units into the skin 2 (two) times daily with a meal.     . isosorbide mononitrate (IMDUR) 30 MG 24 hr tablet Take 30 mg by mouth daily.    Marland Kitchen latanoprost (XALATAN) 0.005 % ophthalmic solution Place 1 drop into both eyes at bedtime.    Marland Kitchen losartan (COZAAR) 100 MG tablet Take 100 mg by mouth daily.    Marland Kitchen MITIGARE 0.6 MG CAPS Take 0.6 mg by mouth daily.    . Multiple Vitamin (MULTIVITAMIN) tablet Take 1 tablet by mouth daily.    . nitroGLYCERIN (NITROSTAT) 0.4 MG SL tablet Place 1 tablet (0.4 mg total) under the tongue every 5 (five) minutes as needed for chest pain (up to 3 doses). 25 tablet 3  . pantoprazole (PROTONIX) 40 MG tablet Take 1 tablet (40 mg total) by mouth 2 (two) times daily. (Patient taking differently: Take 40 mg by mouth daily.) 180 tablet 3  . PRESCRIPTION MEDICATION CPAP: At bedtime    . tamsulosin (FLOMAX) 0.4 MG CAPS capsule Take 0.4 mg by mouth daily after breakfast.     . torsemide (DEMADEX) 20 MG tablet Take 2 tablets (40 mg total) by mouth 2 (two) times daily. 360 tablet 3  . vancomycin (VANCOCIN) 125 MG capsule Take 1 capsule (125 mg total) by mouth 4 (four) times daily. 40 capsule 0  . cephALEXin (KEFLEX) 500 MG capsule Take 1 capsule (500 mg total) by mouth 3 (three) times daily. 21 capsule 0  . metolazone (ZAROXOLYN) 2.5 MG tablet Take 1 tablet (2.5 mg total) by mouth daily. Take one tablet twice a week on Monday and Thursday 30 tablet 3   No current facility-administered medications for this visit.    Physical Examination  Vitals:   09/24/20 0840  BP: 138/68  Pulse:  65  Resp: 20  Temp: 98.1 F (36.7 C)  TempSrc: Temporal  SpO2: 99%  Weight: 198 lb 9.6 oz (90.1 kg)  Height: 6' (1.829 m)    Body mass index is 26.94 kg/m.  General:  Alert and oriented, no acute distress HEENT: Normal Neck: No bruit or JVD Pulmonary: Clear to auscultation bilaterally Cardiac: Regular Rate and Rhythm without murmur Abdomen: Soft, non-tender,  non-distended, no mass, no scars Skin: No rash          Extremity Pulses:  2+ radial, brachial, femoral,  No dorsalis pedis, posterior tibial pulses bilaterally Musculoskeletal: No deformity right LE edema  Neurologic: Upper and lower extremity motor 5/5 and symmetric  DATA:    ABI Findings:  +---------+------------------+-----+----------+--------+  Right  Rt Pressure (mmHg)IndexWaveform Comment   +---------+------------------+-----+----------+--------+  Brachial 128                      +---------+------------------+-----+----------+--------+  PTA   77        0.57 monophasic      +---------+------------------+-----+----------+--------+  DP    171        1.28 monophasic      +---------+------------------+-----+----------+--------+  Great Toe35        0.26 Abnormal       +---------+------------------+-----+----------+--------+   +---------+------------------+-----+----------+-------+  Left   Lt Pressure (mmHg)IndexWaveform Comment  +---------+------------------+-----+----------+-------+  Brachial 134                      +---------+------------------+-----+----------+-------+  PTA   138        1.03 monophasic      +---------+------------------+-----+----------+-------+  DP    181        1.35 monophasic      +---------+------------------+-----+----------+-------+  Great Toe77        0.57 Abnormal       +---------+------------------+-----+----------+-------+   +-------+-----------+-----------+------------+------------+  ABI/TBIToday's ABIToday's TBIPrevious ABIPrevious TBI  +-------+-----------+-----------+------------+------------+  Right [place]     0.26    0.55    0.21      +-------+-----------+-----------+------------+------------+  Left  [place]     0.57     1.08    0.71      +-------+-----------+-----------+------------+------------+       Summary:  Right: Resting right ankle-brachial index indicates noncompressible right  lower extremity arteries. The right toe-brachial index is abnormal.   Left: Resting left ankle-brachial index indicates noncompressible left  lower extremity arteries. The left toe-brachial index is abnormal.    ASSESSMENT:  Known PAD with history of 5th toe amputation.   Now he has developed cellulitis with right 4th toe ischemic changes, GT 1 cm plantar wound. 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 The ABI's Are monophasic with mod to sever disease.   PLAN: I will order Keflex 500 mg TID for the cellulitis and he will be scheduled for 4th toe amputation with Dr. Oneida Alar.  Recommended elevation of the right foot when at rest.     [name] PA-C [facility]: [phone]  MD in office [place]

## 2020-09-25 ENCOUNTER — Other Ambulatory Visit: Payer: Self-pay

## 2020-10-01 ENCOUNTER — Encounter (HOSPITAL_COMMUNITY): Payer: Self-pay | Admitting: Vascular Surgery

## 2020-10-01 NOTE — Progress Notes (Signed)
PCP - Dr Reubin Milan Cardiologist - Dr Peter Martinique Gastro - Dr Loletha Carrow III  Chest x-ray - n/a EKG - 01/17/20 Stress Test - 05/22/17 ECHO - 02/04/20 Cardiac Cath - 06/17/17  Sleep Study -  Yes CPAP - wears cpap nightly  Fasting Blood Sugar - 160s Checks Blood Sugar 1-2 times a day  . THE NIGHT BEFORE SURGERY, take 21 units of Novolog mix 70/30 insulin.     . THE MORNING OF SURGERY, do not take Novolog mix 70/30 insulin.    . If your blood sugar is less than 70 mg/dL, you will need to treat for low blood sugar: o Treat a low blood sugar (less than 70 mg/dL) with  cup of clear juice (cranberry or apple), 4 glucose tablets, OR glucose gel. o Recheck blood sugar in 15 minutes after treatment (to make sure it is greater than 70 mg/dL). If your blood sugar is not greater than 70 mg/dL on recheck, call [phone] for further instructions.  Anesthesia review: Yes  STOP now taking any Aspirin (unless otherwise instructed by your surgeon), Aleve, Naproxen, Ibuprofen, Motrin, Advil, Goody's, BC's, all herbal medications, fish oil, and all vitamins.   Coronavirus Screening Covid test is scheduled on 10/02/20. Do you have any of the following symptoms:  Cough yes/no: No Fever (>100.28F)  yes/no: No Runny nose yes/no: No Sore throat Yes, no other symptoms  Difficulty breathing/shortness of breath  yes/no: No  Have you traveled in the last 14 days and where? yes/no: No  Patient verbalized understanding of instructions that were given via phone.

## 2020-10-01 NOTE — Progress Notes (Signed)
Anesthesia Chart Review: Scott [name]   Case: [id number] Date/Time: 10/06/20 0815   Procedure: RIGHT FOURTH TOE AMPUTATION (Right )   Anesthesia type: Monitor Anesthesia Care   Pre-op diagnosis: RIGHT TOE ULCER   Location: [facility] OR ROOM 11 / [facility]   Surgeons: Elam Dutch, MD    /  DISCUSSION: Patient is an 86 year old male scheduled for the above procedure. Known severe tibial arterial occlusive disease with occlusion of peroneal and PT arteries on the right by 01/04/20 angiogram. S/p right 5th toe amputation 02/25/20. At 09/24/20 follow-up, he reported 4-5 weeks of increased pain, swelling, and erythema of his right foot and found to have right 4th toe ulceration with gangrene and cellulitis.   History includes former smoker (quit 10/12/83), CAD ([place], s/p CABG 1997: LIMA-LAD, SVG-RPDA, SVG-PLV; DES SVG-PLV 07/2004 & 11/2009; DES SVG-RPDA 11/2009; patent grafts 06/2017), chronic combined systolic and diastolic CHF, dyslipidemia, HTN, IDDM2, PAD, thrombocytopenia, OSA (CPAP with O2), GERD, CKD (stage III), colon caner (s/p partial colectomy 2005), cholecystectomy (2002), back surgery.   Last visit with cardiologist Dr. Martinique was on 09/12/20. Asymptomatic from a CAD standpoint, but volume overloaded. Lasix switched to torsemide and metolazone continued. Labs with 4 week follow-up planned. (Cr ~ 2.1 - 2.9 since May 2021.)   Preoperative COVID-19 test scheduled for 10/02/20. He his a same day work-up, so labs and anesthesia team evaluation on the day of surgery.   VS: Ht 6' (1.829 m)   Wt 90.7 kg   BMI 27.12 kg/m  BP Readings from Last 3 Encounters:  09/24/20 138/68  09/12/20 117/75  06/12/20 128/68   Pulse Readings from Last 3 Encounters:  09/24/20 65  09/12/20 67  06/12/20 (!) 58    PROVIDERS: Reubin Milan, MD is listed as PCP  Martinique, Peter, MD is cardiologist   LABS: Most recent lab results include: Lab Results  Component Value Date   WBC 4.7 05/08/2020   HGB 12.2 (L)  05/08/2020   HCT 41.4 05/08/2020   PLT 103 (L) 05/08/2020   GLUCOSE 237 (H) 09/23/2020   ALT 16 09/12/2020   AST 17 09/12/2020   NA 135 09/23/2020   K 4.6 09/23/2020   CL 93 (L) 09/23/2020   CREATININE 2.24 (H) 09/23/2020   BUN 82 (HH) 09/23/2020   CO2 26 09/23/2020   INR 1.1 01/03/2020   HGBA1C 7.6 (H) 01/03/2020    EKG: 01/17/20 ([facility]): Sinus rhythm with 1st degree AV block. Low voltage QRS. Incomplete LBBB. ST & T, consider inferolateral ischemia    CV: TTE 02/04/20: 1. Left ventricular ejection fraction, by estimation, is 35%. The left  ventricle has severely decreased function. The left ventricle demonstrates  global hypokinesis. The left ventricular internal cavity size was severely  dilated. Left ventricular  diastolic parameters are indeterminate.  2. Right ventricular systolic function is mildly reduced. The right  ventricular size is mildly enlarged. There is moderately elevated  pulmonary artery systolic pressure.  3. Left atrial size was mild to moderately dilated.  4. Right atrial size was moderately dilated.  5. The mitral valve is abnormal. Mild to moderate mitral valve  regurgitation.  6. The aortic valve is abnormal. Aortic valve regurgitation is mild. Mild  aortic valve sclerosis is present, with no evidence of aortic valve  stenosis.  7. Aortic dilatation noted. There is mild dilatation of the ascending  aorta measuring 41 mm.  8. The inferior vena cava is dilated in size with <50% respiratory  variability, suggesting  right atrial pressure of 15 mmHg. (Comparison 01/10/14: LVEF 35-40% moderate hypokinesis, PA peak pressure 50 mmHg; 09/01/12 LVEF 25-30% diffuse hypokinesis, mild MR, moderate TR, PA peak pressure 77 mmHg)   Cath 06/17/17: 1. Severe 2 vessel occlusive CAD. - 100% proximal LAD - 100% mid RCA 2. Patent LIMA to the LAD 3. Patent SVG to PDA 4. Patent SVG to PLOM 5. Moderate to severe LV dysfunction. EF estimated at  35%. 6. Normal LVEDP. - Plan: continue medical therapy. Consider GI referral for evaluation of his pain.   Past Medical History:  Diagnosis Date  . Acute biliary pancreatitis 10/2017   with choledocholithiasis.  ERCP, stone extraction performed  . Arthritis   . CAD (coronary artery disease)    a. 1997 CABGx3 (VG->RPDA, VG->PLV, LIMA->LAD);  b. 07/2004 PCI VG->PLV (3.5x16 Taxus DES, 3.5x12 Taxus DES); c. 11/2009 PCI: VG->PLV 3.5x12 Promus DES), VG->RPDA (3.5x15 Promus DES); d. 08/2012 Cath: patent grafts; e. 05/2013 Lexiscan MV: no ischemia/infarction, EF 44%.  . Cancer [facility]) 2005   colon surgery done  . Cataract   . Chronic combined systolic and diastolic CHF (congestive heart failure) ([place]) 08/2012   a. EF 50-55% 11/2011; b. 08/2012 - EF 25-30%,  with mild RM, mod TR, mod RAE & LAE, moderate reduced RV systolic function and PASP of 48mmHg. - > cath showed patent grafts & Mod Puml HTN wth elevated PCWP;  c. 01/2014 Echo: EF 35-40%, mod LVH, mod HK, mildly dil LA, mod dil RA, PASP 34mmHg.  . CKD (chronic kidney disease), stage III ([place])   . Dyslipidemia   . GERD (gastroesophageal reflux disease)   . Gout   . History of home oxygen therapy    uses oxygen nightly with cpap   . Hyperkalemia   . Hypertension   . Insulin Dependent Diabetes mellitus    type 2  . Myocardial infarction ([place]) 1997  . PAD (peripheral artery disease) ([place])   . Sleep apnea   . Thrombocytopenia ([place]) 2011   dates back to 2011  . UTI (lower urinary tract infection) 08/2012   E.coli    Past Surgical History:  Procedure Laterality Date  . ABDOMINAL AORTOGRAM W/LOWER EXTREMITY Right 01/04/2020   Abdominal aortogram with right lower extremity runoff  . ABDOMINAL AORTOGRAM W/LOWER EXTREMITY Bilateral 01/04/2020   Procedure: ABDOMINAL AORTOGRAM W/LOWER EXTREMITY;  Surgeon: Elam Dutch, MD;  Location: [place] CV LAB;  Service: Vascular;  Laterality: Bilateral;  . AMPUTATION Right 02/25/2020   Procedure:  RIGHT FIFTH TOE AMPUTATION;  Surgeon: Elam Dutch, MD;  Location: [place];  Service: Vascular;  Laterality: Right;  . APPENDECTOMY    . BACK SURGERY     15 years ago lower  . CARDIAC CATHETERIZATION  2005   stenting of the vein graft to the posterior lateral per -- Dr. Martinique      . CARDIAC CATHETERIZATION  2011   showed right coronary, totally occluded LAD and severe stenosis in both saphenous vein graft to the posterior lateral and posterior descending  -- stenting of the proximal portion of the SVG to the posterior lateral branch in 2/11  . CHOLECYSTECTOMY    . COLONOSCOPY     x several - colon polyps  . CORONARY ARTERY BYPASS GRAFT  1997    (LIMA to LAD, SVG to PDA, SVG to PL)  . ERCP N/A 10/17/2017    Irene Shipper, MD;  [facility] ENDOSCOPY; choledocholithiasis, biliary pancreatitis.  s/p sphincterotomy and stone extraction.    Marland Kitchen  ESOPHAGOGASTRODUODENOSCOPY (EGD) WITH PROPOFOL N/A 09/21/2017   Procedure: ESOPHAGOGASTRODUODENOSCOPY (EGD) WITH PROPOFOL;  Surgeon: Doran Stabler, MD;  Location: [facility] ENDOSCOPY;  Service: Gastroenterology;  Laterality: N/A;  . EYE SURGERY     ioc for cataract  . HERNIA REPAIR    . LEFT AND RIGHT HEART CATHETERIZATION WITH CORONARY/GRAFT ANGIOGRAM  09/04/2012   Procedure: LEFT AND RIGHT HEART CATHETERIZATION WITH Beatrix Fetters;  Surgeon: Sherren Mocha, MD;  Location: [facility] CATH LAB;  patent grafts, mod 2ndary pulmonary HTN, elevated LV EDP & PCWP  . LEFT HEART CATH AND CORS/GRAFTS ANGIOGRAPHY N/A 06/17/2017   Procedure: LEFT HEART CATH AND CORS/GRAFTS ANGIOGRAPHY;  Surgeon: Martinique, Peter M, MD;  Location: [place] CV LAB;  Service: Cardiovascular;  Laterality: N/A;  . mohs     x several  . PARTIAL COLECTOMY     cancerous polyps  . stent to heart  last done 5 to 6 yrs ago   x 4  . UPPER GI ENDOSCOPY      MEDICATIONS: No current facility-administered medications for this encounter.   Marland Kitchen allopurinol (ZYLOPRIM) 100 MG tablet  . ALPRAZolam (XANAX)  0.25 MG tablet  . aspirin 81 MG EC tablet  . atorvastatin (LIPITOR) 80 MG tablet  . bismuth subsalicylate (PEPTO BISMOL) 262 MG/15ML suspension  . carvedilol (COREG) 12.5 MG tablet  . Cholecalciferol (VITAMIN D-3) 25 MCG (1000 UT) CAPS  . colchicine 0.6 MG tablet  . ferrous sulfate 325 (65 FE) MG tablet  . fluticasone (FLONASE) 50 MCG/ACT nasal spray  . insulin aspart protamine- aspart (NOVOLOG MIX 70/30) (70-30) 100 UNIT/ML injection  . isosorbide mononitrate (IMDUR) 30 MG 24 hr tablet  . latanoprost (XALATAN) 0.005 % ophthalmic solution  . losartan (COZAAR) 100 MG tablet  . Multiple Vitamin (MULTIVITAMIN) tablet  . nitroGLYCERIN (NITROSTAT) 0.4 MG SL tablet  . pantoprazole (PROTONIX) 40 MG tablet  . PRESCRIPTION MEDICATION  . tamsulosin (FLOMAX) 0.4 MG CAPS capsule  . torsemide (DEMADEX) 20 MG tablet  . cephALEXin (KEFLEX) 500 MG capsule  . vancomycin (VANCOCIN) 125 MG capsule    Myra Gianotti, PA-C Surgical Short Stay/Anesthesiology [facility] Phone [phone] [facility] Phone [phone] 10/02/2020 9:28 AM

## 2020-10-02 ENCOUNTER — Other Ambulatory Visit (HOSPITAL_COMMUNITY)
Admission: RE | Admit: 2020-10-02 | Discharge: 2020-10-02 | Disposition: A | Discharge status: Home / Self Care | Payer: Medicare HMO | Source: Ambulatory Visit | Attending: Vascular Surgery | Admitting: Vascular Surgery

## 2020-10-02 DIAGNOSIS — Z01812 Encounter for preprocedural laboratory examination: Secondary | ICD-10-CM | POA: Insufficient documentation

## 2020-10-02 DIAGNOSIS — Z20822 Contact with and (suspected) exposure to covid-19: Secondary | ICD-10-CM | POA: Insufficient documentation

## 2020-10-02 LAB — SARS CORONAVIRUS 2 (TAT 6-24 HRS): SARS Coronavirus 2: NEGATIVE

## 2020-10-02 NOTE — Anesthesia Preprocedure Evaluation (Addendum)
Anesthesia Evaluation  Patient identified by MRN, date of birth, ID band Patient awake    Reviewed: Allergy & Precautions, NPO status , Patient's Chart, lab work & pertinent test results  History of Anesthesia Complications Negative for: history of anesthetic complications  Airway Mallampati: II  TM Distance: >3 FB Neck ROM: Full    Dental  (+) Edentulous Upper, Missing   Pulmonary sleep apnea, Continuous Positive Airway Pressure Ventilation and Oxygen sleep apnea , former smoker,    Pulmonary exam normal        Cardiovascular hypertension, + CAD, + Past MI, + Cardiac Stents (2005, 2011), + CABG (1997), + Peripheral Vascular Disease and +CHF  Normal cardiovascular exam     Neuro/Psych negative neurological ROS  negative psych ROS   GI/Hepatic Neg liver ROS, GERD  ,  Endo/Other  diabetes, Type 2, Insulin Dependent  Renal/GU Renal InsufficiencyRenal disease (CKDIII)  negative genitourinary   Musculoskeletal negative musculoskeletal ROS (+)   Abdominal   Peds  Hematology negative hematology ROS (+)   Anesthesia Other Findings  Echo 02/04/20: EF 35%, global hypokinesis, severe LV dilation, mild RV systolic dysfunction, mod pulm HTN, mild-mod MR, mild AR, mild dilatation of ascending aorta (41 mm)  Last visit with cardiologist Dr. Martinique was on 09/12/20. Asymptomatic from a CAD standpoint, but volume overloaded. Lasix switched to torsemide and metolazone continued. Labs with 4 week follow-up planned. (Cr ~ 2.1 - 2.9 since May 2021.)   Reproductive/Obstetrics                           Anesthesia Physical Anesthesia Plan  ASA: IV  Anesthesia Plan: MAC and Regional   Post-op Pain Management:    Induction: Intravenous  PONV Risk Score and Plan: 1 and Propofol infusion, TIVA and Treatment may vary due to age or medical condition  Airway Management Planned: Natural Airway, Nasal Cannula and  Simple Face Mask  Additional Equipment: None  Intra-op Plan:   Post-operative Plan:   Informed Consent: I have reviewed the patients History and Physical, chart, labs and discussed the procedure including the risks, benefits and alternatives for the proposed anesthesia with the patient or authorized representative who has indicated his/her understanding and acceptance.       Plan Discussed with:   Anesthesia Plan Comments: (PAT note written 10/02/2020 by Myra Gianotti, PA-C. )      Anesthesia Quick Evaluation

## 2020-10-06 ENCOUNTER — Other Ambulatory Visit: Payer: Self-pay

## 2020-10-06 ENCOUNTER — Ambulatory Visit (HOSPITAL_COMMUNITY): Payer: Medicare HMO | Admitting: Vascular Surgery

## 2020-10-06 ENCOUNTER — Ambulatory Visit (HOSPITAL_COMMUNITY)
Admission: RE | Admit: 2020-10-06 | Discharge: 2020-10-06 | Disposition: A | Discharge status: Home / Self Care | Payer: Medicare HMO | Attending: Vascular Surgery | Admitting: Vascular Surgery

## 2020-10-06 ENCOUNTER — Encounter (HOSPITAL_COMMUNITY): Payer: Self-pay | Admitting: Vascular Surgery

## 2020-10-06 ENCOUNTER — Encounter (HOSPITAL_COMMUNITY)
Admission: RE | Disposition: A | Discharge status: Home / Self Care | Payer: Self-pay | Source: Home / Self Care | Attending: Vascular Surgery

## 2020-10-06 DIAGNOSIS — Z9049 Acquired absence of other specified parts of digestive tract: Secondary | ICD-10-CM | POA: Diagnosis not present

## 2020-10-06 DIAGNOSIS — I96 Gangrene, not elsewhere classified: Secondary | ICD-10-CM

## 2020-10-06 DIAGNOSIS — M869 Osteomyelitis, unspecified: Secondary | ICD-10-CM | POA: Diagnosis not present

## 2020-10-06 DIAGNOSIS — Z79899 Other long term (current) drug therapy: Secondary | ICD-10-CM | POA: Insufficient documentation

## 2020-10-06 DIAGNOSIS — Z951 Presence of aortocoronary bypass graft: Secondary | ICD-10-CM | POA: Insufficient documentation

## 2020-10-06 DIAGNOSIS — E1152 Type 2 diabetes mellitus with diabetic peripheral angiopathy with gangrene: Secondary | ICD-10-CM | POA: Diagnosis not present

## 2020-10-06 DIAGNOSIS — Z89421 Acquired absence of other right toe(s): Secondary | ICD-10-CM | POA: Insufficient documentation

## 2020-10-06 DIAGNOSIS — Z7982 Long term (current) use of aspirin: Secondary | ICD-10-CM | POA: Insufficient documentation

## 2020-10-06 DIAGNOSIS — Z87891 Personal history of nicotine dependence: Secondary | ICD-10-CM | POA: Diagnosis not present

## 2020-10-06 DIAGNOSIS — Z794 Long term (current) use of insulin: Secondary | ICD-10-CM | POA: Insufficient documentation

## 2020-10-06 DIAGNOSIS — E1169 Type 2 diabetes mellitus with other specified complication: Secondary | ICD-10-CM | POA: Diagnosis not present

## 2020-10-06 DIAGNOSIS — I739 Peripheral vascular disease, unspecified: Secondary | ICD-10-CM

## 2020-10-06 HISTORY — PX: AMPUTATION: SHX166

## 2020-10-06 LAB — POCT I-STAT, CHEM 8
BUN: 85 mg/dL — ABNORMAL HIGH (ref 8–23)
Calcium, Ion: 1.19 mmol/L (ref 1.15–1.40)
Chloride: 100 mmol/L (ref 98–111)
Creatinine, Ser: 2.5 mg/dL — ABNORMAL HIGH (ref 0.61–1.24)
Glucose, Bld: 143 mg/dL — ABNORMAL HIGH (ref 70–99)
HCT: 36 % — ABNORMAL LOW (ref 39.0–52.0)
Hemoglobin: 12.2 g/dL — ABNORMAL LOW (ref 13.0–17.0)
Potassium: 4 mmol/L (ref 3.5–5.1)
Sodium: 140 mmol/L (ref 135–145)
TCO2: 26 mmol/L (ref 22–32)

## 2020-10-06 LAB — GLUCOSE, CAPILLARY
Glucose-Capillary: 143 mg/dL — ABNORMAL HIGH (ref 70–99)
Glucose-Capillary: 132 mg/dL — ABNORMAL HIGH (ref 70–99)
Glucose-Capillary: 138 mg/dL — ABNORMAL HIGH (ref 70–99)

## 2020-10-06 SURGERY — AMPUTATION DIGIT
Anesthesia: Monitor Anesthesia Care | Site: Foot | Laterality: Right

## 2020-10-06 MED ORDER — CHLORHEXIDINE GLUCONATE 4 % EX LIQD: 60.0000 mL | Freq: Once | CUTANEOUS | Status: DC

## 2020-10-06 MED ORDER — PROPOFOL 10 MG/ML IV BOLUS
INTRAVENOUS | Status: AC
  Filled 2020-10-06: qty 20

## 2020-10-06 MED ORDER — FENTANYL CITRATE (PF) 250 MCG/5ML IJ SOLN
INTRAMUSCULAR | Status: AC
  Filled 2020-10-06: qty 5

## 2020-10-06 MED ORDER — PROPOFOL 500 MG/50ML IV EMUL
INTRAVENOUS | Status: DC | PRN
  Administered 2020-10-06: 50 ug/kg/min via INTRAVENOUS

## 2020-10-06 MED ORDER — CHLORHEXIDINE GLUCONATE 0.12 % MT SOLN
OROMUCOSAL | Status: AC
  Filled 2020-10-06: qty 15

## 2020-10-06 MED ORDER — OXYCODONE HCL 5 MG/5ML PO SOLN
5.0000 mg | Freq: Once | ORAL | Status: DC | PRN
Start: 2020-10-06 — End: 2020-10-06

## 2020-10-06 MED ORDER — ROPIVACAINE HCL 5 MG/ML IJ SOLN
INTRAMUSCULAR | Status: DC | PRN
  Administered 2020-10-06: 20 mL via PERINEURAL

## 2020-10-06 MED ORDER — CEFAZOLIN SODIUM-DEXTROSE 2-4 GM/100ML-% IV SOLN
INTRAVENOUS | Status: AC
  Filled 2020-10-06: qty 100

## 2020-10-06 MED ORDER — CARVEDILOL 12.5 MG PO TABS
ORAL_TABLET | ORAL | Status: AC
  Filled 2020-10-06: qty 1

## 2020-10-06 MED ORDER — 0.9 % SODIUM CHLORIDE (POUR BTL) OPTIME
TOPICAL | Status: DC | PRN
  Administered 2020-10-06: 09:00:00 1000 mL

## 2020-10-06 MED ORDER — CARVEDILOL 12.5 MG PO TABS
12.5000 mg | ORAL_TABLET | Freq: Once | ORAL | Status: AC
  Administered 2020-10-06: 07:00:00 12.5 mg via ORAL
  Filled 2020-10-06: qty 1

## 2020-10-06 MED ORDER — LACTATED RINGERS IV SOLN: INTRAVENOUS | Status: DC | PRN

## 2020-10-06 MED ORDER — FENTANYL CITRATE (PF) 100 MCG/2ML IJ SOLN: 25.0000 ug | INTRAMUSCULAR | Status: DC | PRN

## 2020-10-06 MED ORDER — ONDANSETRON HCL 4 MG/2ML IJ SOLN: 4.0000 mg | Freq: Once | INTRAMUSCULAR | Status: DC | PRN

## 2020-10-06 MED ORDER — MIDAZOLAM HCL 2 MG/2ML IJ SOLN
INTRAMUSCULAR | Status: AC
  Filled 2020-10-06: qty 2

## 2020-10-06 MED ORDER — MIDAZOLAM HCL 5 MG/5ML IJ SOLN
INTRAMUSCULAR | Status: DC | PRN
  Administered 2020-10-06: 2 mg via INTRAVENOUS

## 2020-10-06 MED ORDER — CEFAZOLIN SODIUM-DEXTROSE 2-4 GM/100ML-% IV SOLN: 2.0000 g | INTRAVENOUS | Status: DC

## 2020-10-06 MED ORDER — SODIUM CHLORIDE 0.9 % IV SOLN: INTRAVENOUS | Status: DC

## 2020-10-06 MED ORDER — FENTANYL CITRATE (PF) 100 MCG/2ML IJ SOLN
INTRAMUSCULAR | Status: DC | PRN
  Administered 2020-10-06: 50 ug via INTRAVENOUS

## 2020-10-06 MED ORDER — OXYCODONE HCL 5 MG PO TABS: 5.0000 mg | ORAL_TABLET | Freq: Once | ORAL | Status: DC | PRN

## 2020-10-06 MED ORDER — OXYCODONE-ACETAMINOPHEN 5-325 MG PO TABS: 1.0000 | ORAL_TABLET | ORAL | 0 refills | Status: DC | PRN

## 2020-10-06 SURGICAL SUPPLY — 27 items
BNDG ELASTIC 4X5.8 VLCR STR LF (GAUZE/BANDAGES/DRESSINGS) ×2 IMPLANT
BNDG GAUZE ELAST 4 BULKY (GAUZE/BANDAGES/DRESSINGS) ×2 IMPLANT
CANISTER SUCT 3000ML PPV (MISCELLANEOUS) ×2 IMPLANT
COVER SURGICAL LIGHT HANDLE (MISCELLANEOUS) ×2 IMPLANT
COVER WAND RF STERILE (DRAPES) IMPLANT
DRAPE EXTREMITY T 121X128X90 (DISPOSABLE) ×2 IMPLANT
DRAPE HALF SHEET 40X57 (DRAPES) ×2 IMPLANT
DRSG EMULSION OIL 3X3 NADH (GAUZE/BANDAGES/DRESSINGS) ×2 IMPLANT
ELECT REM PT RETURN 9FT ADLT (ELECTROSURGICAL) ×2
ELECTRODE REM PT RTRN 9FT ADLT (ELECTROSURGICAL) ×1 IMPLANT
GAUZE SPONGE 4X4 12PLY STRL (GAUZE/BANDAGES/DRESSINGS) ×2 IMPLANT
GLOVE BIO SURGEON STRL SZ7.5 (GLOVE) ×2 IMPLANT
GOWN STRL REUS W/ TWL LRG LVL3 (GOWN DISPOSABLE) ×3 IMPLANT
GOWN STRL REUS W/TWL LRG LVL3 (GOWN DISPOSABLE) ×6
KIT BASIN OR (CUSTOM PROCEDURE TRAY) ×2 IMPLANT
KIT TURNOVER KIT B (KITS) ×2 IMPLANT
NS IRRIG 1000ML POUR BTL (IV SOLUTION) ×2 IMPLANT
PACK GENERAL/GYN (CUSTOM PROCEDURE TRAY) ×2 IMPLANT
PAD ARMBOARD 7.5X6 YLW CONV (MISCELLANEOUS) ×4 IMPLANT
SPECIMEN JAR SMALL (MISCELLANEOUS) ×2 IMPLANT
SUT ETHILON 3 0 PS 1 (SUTURE) ×2 IMPLANT
SUT VIC AB 3-0 SH 27 (SUTURE)
SUT VIC AB 3-0 SH 27X BRD (SUTURE) IMPLANT
TOWEL GREEN STERILE (TOWEL DISPOSABLE) ×4 IMPLANT
TOWEL GREEN STERILE FF (TOWEL DISPOSABLE) ×2 IMPLANT
UNDERPAD 30X36 HEAVY ABSORB (UNDERPADS AND DIAPERS) ×2 IMPLANT
WATER STERILE IRR 1000ML POUR (IV SOLUTION) ×2 IMPLANT

## 2020-10-06 NOTE — Op Note (Signed)
Procedure: Amputation right fourth toe with resection of metatarsal head  Preoperative diagnosis: Gangrene right fourth toe  Postoperative diagnosis: Same  Anesthesia: Popliteal block  Operative findings: Good skin edge bleeding no purulent collection  Operative details: After obtaining informed consent, the patient was taken the operating.  The patient was placed in supine position operating table.  A popliteal block had been placed by the anesthesia team.  Sterile prep and drape was performed to the right foot.  A circumferential incision was made at the base of the right fourth toe and carried all the way the level of bone.  Proximal phalanx was debrided away with rongeurs as well as the metatarsal head.  The wound was thoroughly irrigated with normal saline solution.  There was good skin edge bleeding.  After irrigation of the wound, the skin edges were reapproximated with interrupted 3 oh vertical mattress and simple sutures.  The patient tired procedure well and there were no complications.  The instrument sponge needle counts correct in the case.  Patient was taken the recovery room in stable condition.  [name], MD [facility] Office: [phone]

## 2020-10-06 NOTE — Anesthesia Postprocedure Evaluation (Signed)
Anesthesia Post Note  Patient: Scott [name]  Procedure(s) Performed: RIGHT FOURTH TOE AMPUTATION WITH RESECTION OF METATARSAL HEAD (Right Foot)     Patient location during evaluation: PACU Anesthesia Type: MAC Level of consciousness: awake and alert Pain management: pain level controlled Vital Signs Assessment: post-procedure vital signs reviewed and stable Respiratory status: spontaneous breathing, nonlabored ventilation and respiratory function stable Cardiovascular status: blood pressure returned to baseline and stable Postop Assessment: no apparent nausea or vomiting Anesthetic complications: no   No complications documented.  Last Vitals:  Vitals:   10/06/20 0950 10/06/20 1005  BP: 127/68 122/71  Pulse: (!) 53 (!) 54  Resp: 14 19  Temp: 36.5 C 36.5 C  SpO2: 100% 99%    Last Pain:  Vitals:   10/06/20 1005  TempSrc:   PainSc: 0-No pain                 Lidia Collum

## 2020-10-06 NOTE — Anesthesia Procedure Notes (Signed)
Procedure Name: MAC Date/Time: 10/06/2020 8:35 AM Performed by: Moshe Salisbury, CRNA Pre-anesthesia Checklist: Patient identified, Emergency Drugs available, Suction available, Patient being monitored and Timeout performed Patient Re-evaluated:Patient Re-evaluated prior to induction Oxygen Delivery Method: Nasal cannula Placement Confirmation: positive ETCO2 Dental Injury: Teeth and Oropharynx as per pre-operative assessment

## 2020-10-06 NOTE — Interval H&P Note (Signed)
History and Physical Interval Note:  10/06/2020 7:28 AM  Scott [name]  has presented today for surgery, with the diagnosis of RIGHT TOE ULCER.  The various methods of treatment have been discussed with the patient and family. After consideration of risks, benefits and other options for treatment, the patient has consented to  Procedure(s): RIGHT FOURTH TOE AMPUTATION (Right) as a surgical intervention.  The patient's history has been reviewed, patient examined, no change in status, stable for surgery.  I have reviewed the patient's chart and labs.  Questions were answered to the patient's satisfaction.     [name]

## 2020-10-06 NOTE — Progress Notes (Signed)
Orthopedic Tech Progress Note Patient Details:  SHAKEEL DISNEY 1933-04-02 [id number] PACU RN called requesting a POST OP SHOE for patient  Ortho Devices Type of Ortho Device: Postop shoe/boot Ortho Device/Splint Location: RLE Ortho Device/Splint Interventions: Application   Post Interventions Patient Tolerated: Well Instructions Provided: Care of device   [name] 10/06/2020, 10:03 AM

## 2020-10-06 NOTE — Transfer of Care (Signed)
Immediate Anesthesia Transfer of Care Note  Patient: Scott [name]  Procedure(s) Performed: RIGHT FOURTH TOE AMPUTATION WITH RESECTION OF METATARSAL HEAD (Right Foot)  Patient Location: PACU  Anesthesia Type:MAC combined with regional for post-op pain  Level of Consciousness: drowsy and patient cooperative  Airway & Oxygen Therapy: Patient Spontanous Breathing and Patient connected to nasal cannula oxygen  Post-op Assessment: Report given to RN and Post -op Vital signs reviewed and stable  Post vital signs: Reviewed and stable  Last Vitals:  Vitals Value Taken Time  BP 124/62 10/06/20 0920  Temp    Pulse 53 10/06/20 0921  Resp 18 10/06/20 0921  SpO2 98 % 10/06/20 0921  Vitals shown include unvalidated device data.  Last Pain:  Vitals:   10/06/20 1173  TempSrc:   PainSc: 4          Complications: No complications documented.

## 2020-10-06 NOTE — Anesthesia Procedure Notes (Signed)
Anesthesia Regional Block: Popliteal block   Pre-Anesthetic Checklist: ,, timeout performed, Correct Patient, Correct Site, Correct Laterality, Correct Procedure, Correct Position, site marked, Risks and benefits discussed,  Surgical consent,  Pre-op evaluation,  At surgeon's request and post-op pain management  Laterality: Right  Prep: chloraprep       Needles:  Injection technique: Single-shot  Needle Type: Echogenic Stimulator Needle     Needle Length: 10cm  Needle Gauge: 20     Additional Needles:   Procedures:,,,, ultrasound used (permanent image in chart),,,,  Narrative:  Start time: 10/06/2020 8:18 AM End time: 10/06/2020 8:24 AM  Performed by: Personally  Anesthesiologist: Lidia Collum, MD  Additional Notes: Standard monitors applied. Skin prepped. Good needle visualization with ultrasound. Injection made in 5cc increments with no resistance to injection. Patient tolerated the procedure well.

## 2020-10-07 ENCOUNTER — Encounter (HOSPITAL_COMMUNITY): Payer: Self-pay | Admitting: Vascular Surgery

## 2020-10-07 ENCOUNTER — Ambulatory Visit: Payer: Self-pay | Admitting: Physician Assistant

## 2020-10-07 VITALS — BP 137/60 | HR 67 | Temp 98.3°F | Resp 20 | Ht 72.0 in | Wt 204.8 lb

## 2020-10-07 DIAGNOSIS — Z89421 Acquired absence of other right toe(s): Secondary | ICD-10-CM

## 2020-10-07 DIAGNOSIS — M869 Osteomyelitis, unspecified: Secondary | ICD-10-CM

## 2020-10-07 NOTE — Progress Notes (Signed)
POST OPERATIVE OFFICE NOTE    CC:  F/u for surgery  HPI:  This is a 86 y.o. male who is s/p amputation of the right 4th toe with resection of the metatarsal head on 10/06/2020 by Dr. Oneida Alar.  He had good skin edge bleeding with no purulent collection.  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  He had one vessel runoff to the foot via ATA complete to the plantar arch.  There was 90% right profunda stenosis at origin.    Pt returns today with concerns.  He states when he saw how bloody the bandage was, he was concerned.  He states he has not had any fever or chills.  He is wearing his post op shoe.  He is heel weight bearing.     No Known Allergies  Current Outpatient Medications  Medication Sig Dispense Refill  . allopurinol (ZYLOPRIM) 100 MG tablet Take 1 tablet (100 mg total) by mouth daily. (Patient taking differently: Take 100 mg by mouth 2 (two) times daily.) 60 tablet 1  . ALPRAZolam (XANAX) 0.25 MG tablet Take 1 tablet at bedtime as needed for sleep (Patient taking differently: Take 0.25 mg by mouth at bedtime as needed for sleep. Take 1 tablet at bedtime as needed for sleep) 30 tablet 1  . aspirin 81 MG EC tablet Take 81 mg by mouth at bedtime.    Marland Kitchen atorvastatin (LIPITOR) 80 MG tablet Take 80 mg by mouth daily.     Marland Kitchen bismuth subsalicylate (PEPTO BISMOL) 262 MG/15ML suspension Take 30 mLs by mouth every 6 (six) hours as needed for indigestion.    . carvedilol (COREG) 12.5 MG tablet Take 1 tablet (12.5 mg total) by mouth 2 (two) times daily with a meal. 60 tablet 6  . Cholecalciferol (VITAMIN D-3) 25 MCG (1000 UT) CAPS Take 2,000 Units by mouth every other day.     . colchicine 0.6 MG tablet Take 1 tablet (0.6 mg total) by mouth daily as needed. (Patient taking differently: Take 0.6 mg by mouth daily as needed (Gout).) 60 tablet 0  . ferrous sulfate 325 (65 FE) MG tablet Take 325 mg  by mouth every other day.    . fluticasone (FLONASE) 50 MCG/ACT nasal spray Place 2 sprays into both nostrils daily as needed for allergies or rhinitis.    Marland Kitchen insulin aspart protamine- aspart (NOVOLOG MIX 70/30) (70-30) 100 UNIT/ML injection Inject 30 Units into the skin 2 (two) times daily with a meal.     . isosorbide mononitrate (IMDUR) 30 MG 24 hr tablet Take 30 mg by mouth daily.    Marland Kitchen latanoprost (XALATAN) 0.005 % ophthalmic solution Place 1 drop into both eyes at bedtime.    Marland Kitchen losartan (COZAAR) 100 MG tablet Take 100 mg by mouth daily.    . Multiple Vitamin (MULTIVITAMIN) tablet Take 1 tablet by mouth daily.    . nitroGLYCERIN (NITROSTAT) 0.4 MG SL tablet Place 1 tablet (0.4 mg total) under the tongue every 5 (five) minutes as needed for chest pain (up to 3 doses). 25 tablet 3  . oxyCODONE-acetaminophen (PERCOCET) 5-325 MG tablet Take 1 tablet by mouth every 4 (four) hours as needed for severe pain. 20 tablet 0  . pantoprazole (PROTONIX) 40 MG tablet Take 1 tablet (40 mg total) by mouth 2 (two) times daily. (Patient taking differently: Take 40 mg by mouth daily.) 180 tablet 3  . PRESCRIPTION MEDICATION CPAP: At  bedtime    . tamsulosin (FLOMAX) 0.4 MG CAPS capsule Take 0.4 mg by mouth daily after breakfast.     . torsemide (DEMADEX) 20 MG tablet Take 2 tablets (40 mg total) by mouth 2 (two) times daily. 360 tablet 3   No current facility-administered medications for this visit.     ROS:  See HPI  Physical Exam:  Today's Vitals   10/07/20 1410  BP: 137/60  Pulse: 67  Resp: 20  Temp: 98.3 F (36.8 C)  TempSrc: Temporal  SpO2: 95%  Weight: 204 lb 12.8 oz (92.9 kg)  Height: 6' (1.829 m)   Body mass index is 27.78 kg/m.   Incision:      Extremities:  Brisk right DP>PT doppler signals   Assessment/Plan:  This is a 86 y.o. male who is s/p:  amputation of the right 4th toe with resection of the metatarsal head on 10/06/2020 by Dr. Oneida Alar  -pt 24 hours out from right 4th toe  amputation.  The incision looks fine. He has good doppler signals right DP/PT.   Reassured pt bleeding was good and should have adequate blood flow to heal.  No active bleeding at this time.  Will put dry dressing back on today.   -discussed with pt that starting on Thursday, he can shower, pat dry and replace dry gauze daily.  -he has appt on 1/27 for follow up.  He will keep that appt but knows that if he develops any fevers or wound issues to call us sooner and we will be glad to see him.  He is in agreement with this plan. -stressed heel weight bearing only.    Leontine Locket, [facility] [phone]  Clinic MD:  Stanford Breed

## 2020-10-14 ENCOUNTER — Telehealth: Payer: Self-pay

## 2020-10-14 NOTE — Telephone Encounter (Signed)
Patient called to ask for refill on oxycodone s/p toe amp on 12/27. He says the pain is about a 2, sometimes up to a 5, but getting better. He denies fever, drainage, swelling. Says it is slightly red. Advised patient to try tylenol for mild to moderate pain management. Patient verbalizes understanding.

## 2020-10-15 ENCOUNTER — Ambulatory Visit: Payer: Medicare HMO | Admitting: Physician Assistant

## 2020-10-16 ENCOUNTER — Other Ambulatory Visit: Payer: Self-pay

## 2020-10-16 ENCOUNTER — Encounter: Payer: Self-pay | Admitting: Vascular Surgery

## 2020-10-16 ENCOUNTER — Ambulatory Visit (INDEPENDENT_AMBULATORY_CARE_PROVIDER_SITE_OTHER): Payer: Self-pay | Admitting: Vascular Surgery

## 2020-10-16 VITALS — BP 152/78 | HR 55 | Temp 97.3°F | Resp 20 | Ht 72.0 in | Wt 204.0 lb

## 2020-10-16 DIAGNOSIS — I739 Peripheral vascular disease, unspecified: Secondary | ICD-10-CM

## 2020-10-16 MED ORDER — LEVOFLOXACIN 500 MG PO TABS: 500.0000 mg | ORAL_TABLET | Freq: Every day | ORAL | 0 refills | Status: DC

## 2020-10-16 MED ORDER — OXYCODONE-ACETAMINOPHEN 5-325 MG PO TABS: 1.0000 | ORAL_TABLET | Freq: Four times a day (QID) | ORAL | 0 refills | Status: DC | PRN

## 2020-10-16 NOTE — Progress Notes (Signed)
Patient is an 87 year old male who returns for follow-up today.  He recently underwent right fourth toe amputation.  He had previously undergone a right fifth toe amputation which healed successfully.  However, he does have severe tibial disease and is at risk for not healing.  He noticed some yellowish drainage from his right toe amputation site and some bloody drainage and came in today for further evaluation.  He has not had any fever or chills.  He does have some pain in the foot especially in the evening.  This causes him to lose sleep.   Physical exam:  Vitals:   10/16/20 1108  BP: (!) 152/78  Pulse: (!) 55  Resp: 20  Temp: (!) 97.3 F (36.3 C)  SpO2: 95%  Weight: 204 lb (92.5 kg)  Height: 6' (1.829 m)   Right foot some erythema surrounding the incision very mild small amount of serous drainage and some maceration of the skin edge sutures were removed today.  There is no fluctuance.  Assessment: Healing right fourth toe amputation although some evidence of wound breakdown.  I discussed with the patient and his brother today that he may eventually end up with a below-knee amputation if this does not heal.  Plan: We will remove the sutures today.  We will try a few weeks of oral antibiotics to see if we can get the wound to heal.  He will follow-up with me on January 27.  If this fails to heal we may need to consider a below-knee amputation.  He was also given a prescription today for Percocet No. 30 dispensed.  [name], MD [facility] Office: [phone]

## 2020-10-19 DIAGNOSIS — I259 Chronic ischemic heart disease, unspecified: Secondary | ICD-10-CM | POA: Diagnosis not present

## 2020-10-19 DIAGNOSIS — I509 Heart failure, unspecified: Secondary | ICD-10-CM | POA: Diagnosis not present

## 2020-10-19 DIAGNOSIS — I2510 Atherosclerotic heart disease of native coronary artery without angina pectoris: Secondary | ICD-10-CM | POA: Diagnosis not present

## 2020-10-19 DIAGNOSIS — R062 Wheezing: Secondary | ICD-10-CM | POA: Diagnosis not present

## 2020-10-30 ENCOUNTER — Encounter: Payer: Medicare HMO | Admitting: Vascular Surgery

## 2020-10-31 ENCOUNTER — Ambulatory Visit: Payer: Medicare HMO | Admitting: Physician Assistant

## 2020-11-06 ENCOUNTER — Other Ambulatory Visit: Payer: Self-pay

## 2020-11-06 ENCOUNTER — Ambulatory Visit (INDEPENDENT_AMBULATORY_CARE_PROVIDER_SITE_OTHER): Payer: Medicare HMO | Admitting: Physician Assistant

## 2020-11-06 VITALS — BP 121/57 | HR 52 | Temp 98.6°F | Resp 20 | Ht 72.0 in | Wt 204.0 lb

## 2020-11-06 DIAGNOSIS — I739 Peripheral vascular disease, unspecified: Secondary | ICD-10-CM

## 2020-11-06 DIAGNOSIS — S98111A Complete traumatic amputation of right great toe, initial encounter: Secondary | ICD-10-CM

## 2020-11-06 MED ORDER — LEVOFLOXACIN 500 MG PO TABS: 500.0000 mg | ORAL_TABLET | Freq: Every day | ORAL | 0 refills | Status: DC

## 2020-11-06 NOTE — Progress Notes (Signed)
POST OPERATIVE OFFICE NOTE    CC:  F/u for surgery  HPI:  This is a 87 y.o. male who is s/p amputation of the right 4th toe with resection of the metatarsal head on 10/06/2020 by Dr. Oneida Alar.  He had good skin edge bleeding with no purulent collection.  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  He had one vessel runoff to the foot via ATA complete to the plantar arch.  There was 90% right profunda stenosis at origin.     He was seen back on POD 1 in the office with concerns of bleeding but incision looked fine at that time.  He was seen back by  Dr. Oneida Alar on 10/16/2020 after he noticed some yellow drainage from the right great toe amp site and some bloody drainage.  He did not have any fever or chills but was having some pain in the foot causing him to lose sleep.  The sutures were removed that day and he was given a few weeks of abx to see if we could get this to heal with plans to f/u today.  If this fails, he may need a below knee amputation.  He was given an rx for Percocet #30.    Pt returns today for follow up.  He states he is still having a little yellow drainage from the toe amp site.  He has not had any fevers or chills.  He finished his abx yesterday.  He states he has some swelling in that foot.  He states he sits in a dependent position during the day and elevates his legs sometimes.  He does have a small sore on the lateral portion on the plantar aspect of the right foot.   No Known Allergies  Current Outpatient Medications  Medication Sig Dispense Refill  . allopurinol (ZYLOPRIM) 100 MG tablet Take 1 tablet (100 mg total) by mouth daily. (Patient taking differently: Take 100 mg by mouth 2 (two) times daily.) 60 tablet 1  . ALPRAZolam (XANAX) 0.25 MG tablet Take 1 tablet at bedtime as needed for sleep (Patient taking differently: Take 0.25 mg by mouth at bedtime as needed for  sleep. Take 1 tablet at bedtime as needed for sleep) 30 tablet 1  . aspirin 81 MG EC tablet Take 81 mg by mouth at bedtime.    Marland Kitchen atorvastatin (LIPITOR) 80 MG tablet Take 80 mg by mouth daily.     Marland Kitchen bismuth subsalicylate (PEPTO BISMOL) 262 MG/15ML suspension Take 30 mLs by mouth every 6 (six) hours as needed for indigestion.    . carvedilol (COREG) 12.5 MG tablet Take 1 tablet (12.5 mg total) by mouth 2 (two) times daily with a meal. 60 tablet 6  . Cholecalciferol (VITAMIN D-3) 25 MCG (1000 UT) CAPS Take 2,000 Units by mouth every other day.     . colchicine 0.6 MG tablet Take 1 tablet (0.6 mg total) by mouth daily as needed. (Patient taking differently: Take 0.6 mg by mouth daily as needed (Gout).) 60 tablet 0  . ferrous sulfate 325 (65 FE) MG tablet Take 325 mg by mouth every other day.    . fluticasone (FLONASE) 50 MCG/ACT nasal spray Place 2 sprays into both nostrils daily as needed for allergies or rhinitis.    Marland Kitchen insulin aspart protamine- aspart (NOVOLOG MIX 70/30) (70-30) 100 UNIT/ML injection Inject 30 Units into the skin 2 (two) times daily with a meal.     .  isosorbide mononitrate (IMDUR) 30 MG 24 hr tablet Take 30 mg by mouth daily.    Marland Kitchen latanoprost (XALATAN) 0.005 % ophthalmic solution Place 1 drop into both eyes at bedtime.    Marland Kitchen levofloxacin (LEVAQUIN) 500 MG tablet Take 1 tablet (500 mg total) by mouth daily. 21 tablet 0  . losartan (COZAAR) 100 MG tablet Take 100 mg by mouth daily.    . Multiple Vitamin (MULTIVITAMIN) tablet Take 1 tablet by mouth daily.    . nitroGLYCERIN (NITROSTAT) 0.4 MG SL tablet Place 1 tablet (0.4 mg total) under the tongue every 5 (five) minutes as needed for chest pain (up to 3 doses). 25 tablet 3  . oxyCODONE-acetaminophen (PERCOCET/ROXICET) 5-325 MG tablet Take 1-2 tablets by mouth every 6 (six) hours as needed for severe pain. 30 tablet 0  . pantoprazole (PROTONIX) 40 MG tablet Take 1 tablet (40 mg total) by mouth 2 (two) times daily. (Patient taking  differently: Take 40 mg by mouth daily.) 180 tablet 3  . PRESCRIPTION MEDICATION CPAP: At bedtime    . tamsulosin (FLOMAX) 0.4 MG CAPS capsule Take 0.4 mg by mouth daily after breakfast.     . torsemide (DEMADEX) 20 MG tablet Take 2 tablets (40 mg total) by mouth 2 (two) times daily. 360 tablet 3   No current facility-administered medications for this visit.     ROS:  See HPI  Physical Exam:  Today's Vitals   11/06/20 0933  BP: (!) 121/57  Pulse: (!) 52  Resp: 20  Temp: 98.6 F (37 C)  TempSrc: Temporal  SpO2: 96%  Weight: 204 lb (92.5 kg)  Height: 6' (1.829 m)  PainSc: 6    Body mass index is 27.67 kg/m.   Incision:  Healing so far.  Unable to express any drainage today.     Extremities:  Monophasic doppler right DP/PT; brisk right peroneal doppler signal.       Assessment/Plan:  This is a 87 y.o. male who is s/p: amputation of the right 4th toe with resection of the metatarsal head on 10/06/2020 by Dr. Oneida Alar.  -pt seen with Dr. Oneida Alar.  Given he is still having some drainage from the wound, will give another 2 weeks of Levaquin 500mg  daily.  Dry dressing to right foot daily.  Discussed with him to continue soap and water daily, continue heel weight bearing.  Discussed elevating foot when he can to help with the swelling.   -he will f/u in 4-5 weeks either with PA or Dr. Oneida Alar on his clinic day.  Pt will call sooner if he has any issues.   Leontine Locket, [facility] [phone]  Clinic MD:  Pt seen with Dr. Oneida Alar

## 2020-11-19 DIAGNOSIS — I509 Heart failure, unspecified: Secondary | ICD-10-CM | POA: Diagnosis not present

## 2020-11-19 DIAGNOSIS — I259 Chronic ischemic heart disease, unspecified: Secondary | ICD-10-CM | POA: Diagnosis not present

## 2020-11-19 DIAGNOSIS — I2510 Atherosclerotic heart disease of native coronary artery without angina pectoris: Secondary | ICD-10-CM | POA: Diagnosis not present

## 2020-11-19 DIAGNOSIS — R062 Wheezing: Secondary | ICD-10-CM | POA: Diagnosis not present

## 2020-12-01 ENCOUNTER — Encounter: Payer: Self-pay | Admitting: Physician Assistant

## 2020-12-01 ENCOUNTER — Other Ambulatory Visit: Payer: Self-pay

## 2020-12-01 ENCOUNTER — Ambulatory Visit: Payer: Medicare HMO | Admitting: Physician Assistant

## 2020-12-01 VITALS — BP 132/66 | HR 62 | Ht 72.0 in | Wt 201.0 lb

## 2020-12-01 DIAGNOSIS — Z794 Long term (current) use of insulin: Secondary | ICD-10-CM

## 2020-12-01 DIAGNOSIS — I5042 Chronic combined systolic (congestive) and diastolic (congestive) heart failure: Secondary | ICD-10-CM

## 2020-12-01 DIAGNOSIS — I25810 Atherosclerosis of coronary artery bypass graft(s) without angina pectoris: Secondary | ICD-10-CM

## 2020-12-01 DIAGNOSIS — E785 Hyperlipidemia, unspecified: Secondary | ICD-10-CM

## 2020-12-01 DIAGNOSIS — N184 Chronic kidney disease, stage 4 (severe): Secondary | ICD-10-CM | POA: Diagnosis not present

## 2020-12-01 DIAGNOSIS — E119 Type 2 diabetes mellitus without complications: Secondary | ICD-10-CM | POA: Diagnosis not present

## 2020-12-01 DIAGNOSIS — I10 Essential (primary) hypertension: Secondary | ICD-10-CM

## 2020-12-01 NOTE — Patient Instructions (Signed)
Medication Instructions:  °Your physician recommends that you continue on your current medications as directed. Please refer to the Current Medication list given to you today. ° °*If you need a refill on your cardiac medications before your next appointment, please call your pharmacy* ° °Lab Work: °NONE ordered at this time of appointment  ° °If you have labs (blood work) drawn today and your tests are completely normal, you will receive your results only by: °MyChart Message (if you have MyChart) OR °A paper copy in the mail °If you have any lab test that is abnormal or we need to change your treatment, we will call you to review the results. ° °Testing/Procedures: °NONE ordered at this time of appointment  ° °Follow-Up: °At [facility], you and your health needs are our priority.  As part of our continuing mission to provide you with exceptional heart care, we have created designated Provider Care Teams.  These Care Teams include your primary Cardiologist (physician) and Advanced Practice Providers (APPs -  Physician Assistants and Nurse Practitioners) who all work together to provide you with the care you need, when you need it. ° °We recommend signing up for the patient portal called "MyChart".  Sign up information is provided on this After Visit Summary.  MyChart is used to connect with patients for Virtual Visits (Telemedicine).  Patients are able to view lab/test results, encounter notes, upcoming appointments, etc.  Non-urgent messages can be sent to your provider as well.   °To learn more about what you can do with MyChart, go to [email or web address].   ° °Your next appointment:   °3 month(s) ° °The format for your next appointment:   °In Person ° °Provider:   °Peter Jordan, MD   ° °Other Instructions ° ° °

## 2020-12-01 NOTE — Progress Notes (Signed)
Cardiology Office Note:    Date:  12/01/2020   ID:  Scott [name], DOB 1933/03/15, MRN [id number]  PCP:  Reubin Milan, MD   [place]  Cardiologist:  Peter Martinique, MD  Advanced Practice Provider:  No care team member to display Electrophysiologist:  None   Referring MD: Reubin Milan, MD   Chief Complaint  Patient presents with  . Follow-up    Seen for Dr. Martinique    History of Present Illness:    Scott [name] is a 87 y.o. male with a hx of CAD s/p CABG (LIMA to LAD, SVG to PLA, and SVG to PDA), HTN, HLD, chronic combined systolic and diastolic heart failure, IDDM, CKD stage IV, obstructive sleep apnea on CPAP therapy and dietary noncompliance.  Echocardiogram in December 2013 showed EF 25 to 30%.  Cardiac catheterization in November 2013 revealed diffuse CAD with occluded mid RCA, 75% distal left main, 30% left circumflex disease, widely patent SVG to PDA, patent SVG to PLA with mild in-stent restenosis, patent LIMA to LAD.  EF improved to 35 to 40% on echocardiogram by April 2015.  Myoview in August 2018 showed a small area of questionable ischemia versus attenuation artifact, overall low risk.  Repeat cardiac catheterization due to persistent symptoms in 2018 showed occlusion of RCA and LAD, patent grafts.  He also has severe PAD noted on previous angiogram in 2021 that was treated medically.  He underwent right fifth toe due to osteomyelitis in 2021.  He was last seen by Dr. Martinique on 09/12/2020 at which time it was not felt he was volume overloaded.  At the time he was taking Lasix 40 mg twice daily and add metolazone 2.5 mg twice weekly dosing.  His weight was up 15 pounds.  Lasix was switched to torsemide 40 mg twice daily dosing and metolazone was resumed at 2.5 mg twice weekly dosing.  Weight was 215 pounds at the time.  Due to gangrene, he underwent a right fourth toe amputation by Dr. Oneida Alar on 10/06/2020.  By the time he returned to follow-up with  vascular service near the end of January, his weight was 205 pounds.  Patient presents today for follow-up.  His weight is 201 pounds.  Unfortunately he is not very clear about his medication.  He says he manages his own medication and is quite independent.  He recently had blood work at the [facility] hospital last week and was told his renal function was stable.  He was placed on iron therapy.  He denies any recent chest pain or worsening shortness of breath.  He will occasionally have swelling in his feet when he is up for a long period of time.  However today he does not have any obvious swelling at all on physical exam.  He thinks he has stopped losartan and metolazone however does not remember ever switching from Lasix to torsemide.  I asked him to double check his medication at home, we will call him around lunchtime today to verify his medication.  He requested a refill for colchicine and alprazolam, I will check with MD.  Overall, he has been doing well and they can follow-up in 3 months.   Past Medical History:  Diagnosis Date  . Acute biliary pancreatitis 10/2017   with choledocholithiasis.  ERCP, stone extraction performed  . Arthritis   . CAD (coronary artery disease)    a. 1997 CABGx3 (VG->RPDA, VG->PLV, LIMA->LAD);  b. 07/2004 PCI VG->PLV (3.5x16 Taxus DES, 3.5x12  Taxus DES); c. 11/2009 PCI: VG->PLV 3.5x12 Promus DES), VG->RPDA (3.5x15 Promus DES); d. 08/2012 Cath: patent grafts; e. 05/2013 Lexiscan MV: no ischemia/infarction, EF 44%.  . Cancer [facility]) 2005   colon surgery done  . Cataract   . Chronic combined systolic and diastolic CHF (congestive heart failure) ([place]) 08/2012   a. EF 50-55% 11/2011; b. 08/2012 - EF 25-30%,  with mild RM, mod TR, mod RAE & LAE, moderate reduced RV systolic function and PASP of 33mmHg. - > cath showed patent grafts & Mod Puml HTN wth elevated PCWP;  c. 01/2014 Echo: EF 35-40%, mod LVH, mod HK, mildly dil LA, mod dil RA, PASP 33mmHg.  . CKD (chronic kidney disease),  stage III ([place])   . Dyslipidemia   . GERD (gastroesophageal reflux disease)   . Gout   . History of home oxygen therapy    uses oxygen nightly with cpap   . Hyperkalemia   . Hypertension   . Insulin Dependent Diabetes mellitus    type 2  . Myocardial infarction ([place]) 1997  . PAD (peripheral artery disease) ([place])   . Sleep apnea   . Thrombocytopenia ([place]) 2011   dates back to 2011  . UTI (lower urinary tract infection) 08/2012   E.coli    Past Surgical History:  Procedure Laterality Date  . ABDOMINAL AORTOGRAM W/LOWER EXTREMITY Right 01/04/2020   Abdominal aortogram with right lower extremity runoff  . ABDOMINAL AORTOGRAM W/LOWER EXTREMITY Bilateral 01/04/2020   Procedure: ABDOMINAL AORTOGRAM W/LOWER EXTREMITY;  Surgeon: Elam Dutch, MD;  Location: [place] CV LAB;  Service: Vascular;  Laterality: Bilateral;  . AMPUTATION Right 02/25/2020   Procedure: RIGHT FIFTH TOE AMPUTATION;  Surgeon: Elam Dutch, MD;  Location: [facility] OR;  Service: Vascular;  Laterality: Right;  . AMPUTATION Right 10/06/2020   Procedure: RIGHT FOURTH TOE AMPUTATION WITH RESECTION OF METATARSAL HEAD;  Surgeon: Elam Dutch, MD;  Location: [place];  Service: Vascular;  Laterality: Right;  . APPENDECTOMY    . BACK SURGERY     15 years ago lower  . CARDIAC CATHETERIZATION  2005   stenting of the vein graft to the posterior lateral per -- Dr. Martinique      . CARDIAC CATHETERIZATION  2011   showed right coronary, totally occluded LAD and severe stenosis in both saphenous vein graft to the posterior lateral and posterior descending  -- stenting of the proximal portion of the SVG to the posterior lateral branch in 2/11  . CHOLECYSTECTOMY    . COLONOSCOPY     x several - colon polyps  . CORONARY ARTERY BYPASS GRAFT  1997    (LIMA to LAD, SVG to PDA, SVG to PL)  . ERCP N/A 10/17/2017    Irene Shipper, MD;  [facility] ENDOSCOPY; choledocholithiasis, biliary pancreatitis.  s/p sphincterotomy and stone extraction.     . ESOPHAGOGASTRODUODENOSCOPY (EGD) WITH PROPOFOL N/A 09/21/2017   Procedure: ESOPHAGOGASTRODUODENOSCOPY (EGD) WITH PROPOFOL;  Surgeon: Doran Stabler, MD;  Location: [facility] ENDOSCOPY;  Service: Gastroenterology;  Laterality: N/A;  . EYE SURGERY     ioc for cataract  . HERNIA REPAIR    . LEFT AND RIGHT HEART CATHETERIZATION WITH CORONARY/GRAFT ANGIOGRAM  09/04/2012   Procedure: LEFT AND RIGHT HEART CATHETERIZATION WITH Beatrix Fetters;  Surgeon: Sherren Mocha, MD;  Location: [facility] CATH LAB;  patent grafts, mod 2ndary pulmonary HTN, elevated LV EDP & PCWP  . LEFT HEART CATH AND CORS/GRAFTS ANGIOGRAPHY N/A 06/17/2017   Procedure: LEFT HEART CATH AND  CORS/GRAFTS ANGIOGRAPHY;  Surgeon: Martinique, Peter M, MD;  Location: [place] CV LAB;  Service: Cardiovascular;  Laterality: N/A;  . mohs     x several  . PARTIAL COLECTOMY     cancerous polyps  . stent to heart  last done 5 to 6 yrs ago   x 4  . UPPER GI ENDOSCOPY      Current Medications: Current Meds  Medication Sig  . allopurinol (ZYLOPRIM) 100 MG tablet Take 1 tablet (100 mg total) by mouth daily. (Patient taking differently: Take 100 mg by mouth 2 (two) times daily.)  . ALPRAZolam (XANAX) 0.25 MG tablet Take 1 tablet at bedtime as needed for sleep (Patient taking differently: Take 0.25 mg by mouth at bedtime as needed for sleep. Take 1 tablet at bedtime as needed for sleep)  . aspirin 81 MG EC tablet Take 81 mg by mouth at bedtime.  Marland Kitchen atorvastatin (LIPITOR) 80 MG tablet Take 80 mg by mouth daily.   Marland Kitchen bismuth subsalicylate (PEPTO BISMOL) 262 MG/15ML suspension Take 30 mLs by mouth every 6 (six) hours as needed for indigestion.  . carvedilol (COREG) 12.5 MG tablet Take 1 tablet (12.5 mg total) by mouth 2 (two) times daily with a meal.  . Cholecalciferol (VITAMIN D-3) 25 MCG (1000 UT) CAPS Take 2,000 Units by mouth every other day.   . colchicine 0.6 MG tablet Take 1 tablet (0.6 mg total) by mouth daily as needed. (Patient taking  differently: Take 0.6 mg by mouth daily as needed (Gout).)  . ferrous sulfate 325 (65 FE) MG tablet Take 325 mg by mouth every other day.  . fluticasone (FLONASE) 50 MCG/ACT nasal spray Place 2 sprays into both nostrils daily as needed for allergies or rhinitis.  Marland Kitchen insulin aspart protamine- aspart (NOVOLOG MIX 70/30) (70-30) 100 UNIT/ML injection Inject 30 Units into the skin 2 (two) times daily with a meal.   . isosorbide mononitrate (IMDUR) 30 MG 24 hr tablet Take 30 mg by mouth daily.  Marland Kitchen latanoprost (XALATAN) 0.005 % ophthalmic solution Place 1 drop into both eyes at bedtime.  Marland Kitchen levofloxacin (LEVAQUIN) 500 MG tablet Take 1 tablet (500 mg total) by mouth daily.  Marland Kitchen losartan (COZAAR) 100 MG tablet Take 100 mg by mouth daily.  . Multiple Vitamin (MULTIVITAMIN) tablet Take 1 tablet by mouth daily.  . nitroGLYCERIN (NITROSTAT) 0.4 MG SL tablet Place 1 tablet (0.4 mg total) under the tongue every 5 (five) minutes as needed for chest pain (up to 3 doses).  Marland Kitchen oxyCODONE-acetaminophen (PERCOCET/ROXICET) 5-325 MG tablet Take 1-2 tablets by mouth every 6 (six) hours as needed for severe pain.  . pantoprazole (PROTONIX) 40 MG tablet Take 1 tablet (40 mg total) by mouth 2 (two) times daily. (Patient taking differently: Take 40 mg by mouth daily.)  . PRESCRIPTION MEDICATION CPAP: At bedtime  . tamsulosin (FLOMAX) 0.4 MG CAPS capsule Take 0.4 mg by mouth daily after breakfast.   . torsemide (DEMADEX) 20 MG tablet Take 2 tablets (40 mg total) by mouth 2 (two) times daily.     Allergies:   Patient has no known allergies.   Social History   Socioeconomic History  . Marital status: Single    Spouse name: Not on file  . Number of children: 0  . Years of education: Not on file  . Highest education level: Not on file  Occupational History  . Occupation: retired   ran a Office manager course  Tobacco Use  . Smoking status: Former Smoker  Packs/day: 1.50    Years: 35.00    Pack years: 52.50    Types: Cigarettes     Quit date: 10/12/1983    Years since quitting: 37.1  . Smokeless tobacco: Never Used  Vaping Use  . Vaping Use: Never used  Substance and Sexual Activity  . Alcohol use: Yes    Alcohol/week: 3.0 - 4.0 standard drinks    Types: 3 - 4 Standard drinks or equivalent per week  . Drug use: No  . Sexual activity: Not Currently    Birth control/protection: None  Other Topics Concern  . Not on file  Social History Narrative   Lives in [place] by himself.  Active but does not routinely exercise.   Social Determinants of Health   Financial Resource Strain: Not on file  Food Insecurity: Not on file  Transportation Needs: Not on file  Physical Activity: Not on file  Stress: Not on file  Social Connections: Not on file     Family History: The patient's family history includes Asthma in his brother; Cardiomyopathy in his mother; Coronary artery disease in his brother and father; Heart attack in his father; Kidney disease in his sister.  ROS:   Please see the history of present illness.     All other systems reviewed and are negative.  EKGs/Labs/Other Studies Reviewed:    The following studies were reviewed today:  Echo 02/04/2020 1. Left ventricular ejection fraction, by estimation, is 35%%. The left  ventricle has severely decreased function. The left ventricle demonstrates  global hypokinesis. The left ventricular internal cavity size was severely  dilated. Left ventricular  diastolic parameters are indeterminate.  2. Right ventricular systolic function is mildly reduced. The right  ventricular size is mildly enlarged. There is moderately elevated  pulmonary artery systolic pressure.  3. Left atrial size was mild to moderately dilated.  4. Right atrial size was moderately dilated.  5. The mitral valve is abnormal. Mild to moderate mitral valve  regurgitation.  6. The aortic valve is abnormal. Aortic valve regurgitation is mild. Mild  aortic valve sclerosis is present, with  no evidence of aortic valve  stenosis.  7. Aortic dilatation noted. There is mild dilatation of the ascending  aorta measuring 41 mm.  8. The inferior vena cava is dilated in size with <50% respiratory  variability, suggesting right atrial pressure of 15 mmHg.   Comparison(s): The left ventricular function is unchanged.   EKG:  EKG is not ordered today.    Recent Labs: 05/08/2020: Platelets 103 09/12/2020: ALT 16; BNP 796.8 10/06/2020: BUN 85; Creatinine, Ser 2.50; Hemoglobin 12.2; Potassium 4.0; Sodium 140  Recent Lipid Panel    Component Value Date/Time   CHOL 72 (L) 09/12/2020 1226   TRIG 49 09/12/2020 1226   HDL 40 09/12/2020 1226   CHOLHDL 1.8 09/12/2020 1226   CHOLHDL 3.0 05/22/2017 0359   VLDL 42 (H) 05/22/2017 0359   LDLCALC 19 09/12/2020 1226   LDLDIRECT 29.8 08/06/2013 0849     Risk Assessment/Calculations:       Physical Exam:    VS:  BP 132/66   Pulse 62   Ht 6' (1.829 m)   Wt 201 lb (91.2 kg)   SpO2 92%   BMI 27.26 kg/m     Wt Readings from Last 3 Encounters:  12/01/20 201 lb (91.2 kg)  11/06/20 204 lb (92.5 kg)  10/16/20 204 lb (92.5 kg)     GEN:  Well nourished, well developed in no acute distress HEENT:  Normal NECK: No JVD; No carotid bruits LYMPHATICS: No lymphadenopathy CARDIAC: RRR, no murmurs, rubs, gallops RESPIRATORY:  Clear to auscultation without rales, wheezing or rhonchi  ABDOMEN: Soft, non-tender, non-distended MUSCULOSKELETAL:  No edema; No deformity  SKIN: Warm and dry NEUROLOGIC:  Alert and oriented x 3 PSYCHIATRIC:  Normal affect   ASSESSMENT:    1. Chronic combined systolic and diastolic heart failure ([place])   2. Coronary artery disease involving coronary bypass graft of native heart without angina pectoris   3. Essential hypertension   4. Hyperlipidemia LDL goal <70   5. Controlled type 2 diabetes mellitus without complication, with long-term current use of insulin ([place])   6. Chronic kidney disease (CKD), stage IV  (severe) (HCC)    PLAN:    In order of problems listed above:  1. Chronic combined systolic and diastolic heart failure: Euvolemic on physical exam.  Unfortunately patient is not very clear on which medication he is taking and which medication he has not.  He does not remember switching from Lasix to torsemide.  He did stop losartan and metolazone.  We will call him around lunchtime to verify his medication again.  2. CAD s/p CABG: Denies any recent chest pain.  3. Hypertension: Blood pressure stable  4. Hyperlipidemia: On Lipitor  5. DM2: Managed by primary care provider.  On insulin  6. CKD stage IV: He reportedly had blood work at the [facility] hospital last week and was told renal function is stable.  He follows nephrology service at [facility].        Medication Adjustments/Labs and Tests Ordered: Current medicines are reviewed at length with the patient today.  Concerns regarding medicines are outlined above.  No orders of the defined types were placed in this encounter.  No orders of the defined types were placed in this encounter.   Patient Instructions  Medication Instructions:  Your physician recommends that you continue on your current medications as directed. Please refer to the Current Medication list given to you today.  *If you need a refill on your cardiac medications before your next appointment, please call your pharmacy*  Lab Work: NONE ordered at this time of appointment   If you have labs (blood work) drawn today and your tests are completely normal, you will receive your results only by: Marland Kitchen MyChart Message (if you have MyChart) OR . A paper copy in the mail If you have any lab test that is abnormal or we need to change your treatment, we will call you to review the results.  Testing/Procedures: NONE ordered at this time of appointment   Follow-Up: At [facility], you and your health needs are our priority.  As part of our continuing mission to provide  you with exceptional heart care, we have created designated Provider Care Teams.  These Care Teams include your primary Cardiologist (physician) and Advanced Practice Providers (APPs -  Physician Assistants and Nurse Practitioners) who all work together to provide you with the care you need, when you need it.  We recommend signing up for the patient portal called "MyChart".  Sign up information is provided on this After Visit Summary.  MyChart is used to connect with patients for Virtual Visits (Telemedicine).  Patients are able to view lab/test results, encounter notes, upcoming appointments, etc.  Non-urgent messages can be sent to your provider as well.   To learn more about what you can do with MyChart, go to [email or web address].    Your next appointment:   3  month(s)  The format for your next appointment:   In Person  Provider:   Peter Martinique, MD  Other Instructions      Signed, Almyra Deforest, [place]  12/01/2020 10:53 AM    [place]

## 2020-12-02 ENCOUNTER — Telehealth: Payer: Self-pay

## 2020-12-02 NOTE — Telephone Encounter (Signed)
Called patient to follow up office visit (12/01/20) to make sure he does not have the medication bottles for Losartan or Metolazone nor he is not taking for those medications were previously discontinued. Also need to make sure patient has medication bottle for Torsemide and not Lasix since was switched to torsemide on recent office visit with Dr. Martinique

## 2020-12-04 ENCOUNTER — Ambulatory Visit (INDEPENDENT_AMBULATORY_CARE_PROVIDER_SITE_OTHER): Payer: Medicare HMO | Admitting: Physician Assistant

## 2020-12-04 ENCOUNTER — Other Ambulatory Visit: Payer: Self-pay

## 2020-12-04 ENCOUNTER — Other Ambulatory Visit: Payer: Self-pay | Admitting: *Deleted

## 2020-12-04 VITALS — BP 122/59 | HR 54 | Temp 98.7°F | Resp 20 | Ht 72.0 in | Wt 204.6 lb

## 2020-12-04 DIAGNOSIS — I739 Peripheral vascular disease, unspecified: Secondary | ICD-10-CM

## 2020-12-04 NOTE — Progress Notes (Signed)
POST OPERATIVE OFFICE NOTE    CC:  F/u for surgery  HPI:  This is a 87 y.o. male who is s/p amputation of the right 4th toe with resection of the metatarsal headon 12/27/2021by Dr. Oneida Alar.He had good skin edge bleeding with no purulent collection.  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 He had one vessel runoff to the foot via ATA complete to the plantar arch. There was 90% right profunda stenosis at origin and Possible right popliteal aneurysm will clarify with ultrasound post procedure.    He was seen back on POD 1 in the office with concerns of bleeding but incision looked fine at that time.  He was seen back by  Dr. Oneida Alar on 10/16/2020 after he noticed some yellow drainage from the right great toe amp site and some bloody drainage.  He did not have any fever or chills but was having some pain in the foot causing him to lose sleep.  The sutures were removed that day and he was given a few weeks of abx to see if we could get this to heal with plans to f/u today.  If this fails, he may need a below knee amputation.  He was given an rx for Percocet #30.    Pt was last seen on 11/06/2020 and he was still having some drainage from the toe amp site.  He did not have fever or chills and had finished his abx.  He had a small sore on the lateral portion on the plantar aspect of the right foot.    Pt returns today for follow up.  He states that he has not had anymore drainage.  He states he started wearing a normal shoe last Friday.  He has some pain around the plantar surface of the distal foot when he stands.  He denies fever/chills.    On his angiogram, Dr. Oneida Alar mentioned possible right popliteal aneurysm.    No Known Allergies  Current Outpatient Medications  Medication Sig Dispense Refill  . allopurinol (ZYLOPRIM) 100 MG tablet Take 1 tablet (100 mg total) by mouth daily. (Patient  taking differently: Take 100 mg by mouth 2 (two) times daily.) 60 tablet 1  . ALPRAZolam (XANAX) 0.25 MG tablet Take 1 tablet at bedtime as needed for sleep (Patient taking differently: Take 0.25 mg by mouth at bedtime as needed for sleep. Take 1 tablet at bedtime as needed for sleep) 30 tablet 1  . aspirin 81 MG EC tablet Take 81 mg by mouth at bedtime.    Marland Kitchen atorvastatin (LIPITOR) 80 MG tablet Take 80 mg by mouth daily.     Marland Kitchen bismuth subsalicylate (PEPTO BISMOL) 262 MG/15ML suspension Take 30 mLs by mouth every 6 (six) hours as needed for indigestion.    . carvedilol (COREG) 12.5 MG tablet Take 1 tablet (12.5 mg total) by mouth 2 (two) times daily with a meal. 60 tablet 6  . Cholecalciferol (VITAMIN D-3) 25 MCG (1000 UT) CAPS Take 2,000 Units by mouth every other day.     . clopidogrel (PLAVIX) 75 MG tablet Take 1 tablet by mouth daily.    . colchicine 0.6 MG tablet Take 1 tablet (0.6 mg total) by mouth daily as needed. (Patient taking differently: Take 0.6 mg by mouth daily as needed (Gout).) 60 tablet 0  . Febuxostat 80 MG TABS TAKE ONE-HALF TABLET BY MOUTH DAILY *NOTE CHANGE IN DIRECTIONS*    .  ferrous sulfate 325 (65 FE) MG tablet Take 325 mg by mouth every other day.    . fluticasone (FLONASE) 50 MCG/ACT nasal spray Place 2 sprays into both nostrils daily as needed for allergies or rhinitis.    . furosemide (LASIX) 80 MG tablet TAKE ONE TABLET BY MOUTH DAILY (INCREASED DOSE)    . gabapentin (NEURONTIN) 300 MG capsule Take 1 capsule by mouth 2 (two) times daily.    . insulin aspart protamine- aspart (NOVOLOG MIX 70/30) (70-30) 100 UNIT/ML injection Inject 30 Units into the skin 2 (two) times daily with a meal.     . isosorbide mononitrate (IMDUR) 30 MG 24 hr tablet Take 30 mg by mouth daily.    Marland Kitchen latanoprost (XALATAN) 0.005 % ophthalmic solution Place 1 drop into both eyes at bedtime.    Marland Kitchen levofloxacin (LEVAQUIN) 500 MG tablet Take 1 tablet (500 mg total) by mouth daily. 14 tablet 0  .  losartan (COZAAR) 100 MG tablet Take 100 mg by mouth daily.    . Multiple Vitamin (MULTIVITAMIN) tablet Take 1 tablet by mouth daily.    . nitroGLYCERIN (NITROSTAT) 0.4 MG SL tablet Place 1 tablet (0.4 mg total) under the tongue every 5 (five) minutes as needed for chest pain (up to 3 doses). 25 tablet 3  . oxyCODONE-acetaminophen (PERCOCET/ROXICET) 5-325 MG tablet Take 1-2 tablets by mouth every 6 (six) hours as needed for severe pain. 30 tablet 0  . pantoprazole (PROTONIX) 40 MG tablet Take 1 tablet (40 mg total) by mouth 2 (two) times daily. (Patient taking differently: Take 40 mg by mouth daily.) 180 tablet 3  . PRESCRIPTION MEDICATION CPAP: At bedtime    . tamsulosin (FLOMAX) 0.4 MG CAPS capsule Take 0.4 mg by mouth daily after breakfast.     . torsemide (DEMADEX) 20 MG tablet Take 2 tablets (40 mg total) by mouth 2 (two) times daily. 360 tablet 3   No current facility-administered medications for this visit.     ROS:  See HPI  Physical Exam:  Today's Vitals   12/04/20 1009  BP: (!) 122/59  Pulse: (!) 54  Resp: 20  Temp: 98.7 F (37.1 C)  TempSrc: Temporal  SpO2: 97%  Weight: 204 lb 9.6 oz (92.8 kg)  Height: 6' (1.829 m)   Body mass index is 27.75 kg/m.   Incision:      Extremities:  Brisk right PT doppler signal>brisk right AT doppler signal   Assessment/Plan:  This is a 87 y.o. male who is s/p: amputation of the right 4th toe with resection of the metatarsal headon 12/27/2021by Dr. Oneida Alar.  -pt's wound looks good today.  Drainage has stopped and pt remains afebrile.  He is now wearing a normal shoe.  We discussed that if this starts rubbing or causing pain to stop wearing it.   -an angiogram, Dr. Oneida Alar noted possible popliteal aneurysm-discussed with him and we will have pt return in 3 months with ABI and u/s of RLE to evaluate popliteal artery.   -wound on plantar aspect of foot is completely healed.  -pt will call sooner if he has any issues. -continue  statin/asa   Leontine Locket, [facility] [phone]  Clinic MD:  Oneida Alar

## 2020-12-10 ENCOUNTER — Other Ambulatory Visit: Payer: Self-pay

## 2020-12-10 MED ORDER — ALPRAZOLAM 0.25 MG PO TABS: ORAL_TABLET | ORAL | 1 refills | Status: DC

## 2020-12-12 ENCOUNTER — Telehealth: Payer: Self-pay | Admitting: Cardiology

## 2020-12-12 NOTE — Telephone Encounter (Signed)
   *  STAT* If patient is at the pharmacy, call can be transferred to refill team.   1. Which medications need to be refilled? (please list name of each medication and dose if known) ALPRAZolam (XANAX) 0.25 MG tablet  colchicine 0.6 MG tablet    2. Which pharmacy/location (including street and city if local pharmacy) is medication to be sent to? [place], [place] - [address] AT [place]  3. Do they need a 30 day or 90 day supply? 90 days   Pharmacy was down please resend xanax

## 2020-12-15 MED ORDER — ALPRAZOLAM 0.25 MG PO TABS: ORAL_TABLET | ORAL | 1 refills | Status: DC

## 2020-12-15 MED ORDER — COLCHICINE 0.6 MG PO TABS: 0.6000 mg | ORAL_TABLET | Freq: Every day | ORAL | 3 refills | Status: DC | PRN

## 2020-12-17 DIAGNOSIS — I2510 Atherosclerotic heart disease of native coronary artery without angina pectoris: Secondary | ICD-10-CM | POA: Diagnosis not present

## 2020-12-17 DIAGNOSIS — I509 Heart failure, unspecified: Secondary | ICD-10-CM | POA: Diagnosis not present

## 2020-12-17 DIAGNOSIS — R062 Wheezing: Secondary | ICD-10-CM | POA: Diagnosis not present

## 2020-12-17 DIAGNOSIS — I259 Chronic ischemic heart disease, unspecified: Secondary | ICD-10-CM | POA: Diagnosis not present

## 2021-01-17 DIAGNOSIS — I2510 Atherosclerotic heart disease of native coronary artery without angina pectoris: Secondary | ICD-10-CM | POA: Diagnosis not present

## 2021-01-17 DIAGNOSIS — I259 Chronic ischemic heart disease, unspecified: Secondary | ICD-10-CM | POA: Diagnosis not present

## 2021-01-17 DIAGNOSIS — R062 Wheezing: Secondary | ICD-10-CM | POA: Diagnosis not present

## 2021-01-17 DIAGNOSIS — I509 Heart failure, unspecified: Secondary | ICD-10-CM | POA: Diagnosis not present

## 2021-02-16 DIAGNOSIS — I2510 Atherosclerotic heart disease of native coronary artery without angina pectoris: Secondary | ICD-10-CM | POA: Diagnosis not present

## 2021-02-16 DIAGNOSIS — R062 Wheezing: Secondary | ICD-10-CM | POA: Diagnosis not present

## 2021-02-16 DIAGNOSIS — I259 Chronic ischemic heart disease, unspecified: Secondary | ICD-10-CM | POA: Diagnosis not present

## 2021-02-16 DIAGNOSIS — I509 Heart failure, unspecified: Secondary | ICD-10-CM | POA: Diagnosis not present

## 2021-02-27 NOTE — Progress Notes (Signed)
Cardiology Office Note    Date:  03/02/2021   ID:  Scott [name], DOB December 14, 1932, MRN [id number]  PCP:  Reubin Milan, MD  Cardiologist:  Dr. Martinique  No chief complaint on file.   History of Present Illness:  Scott [name] is a 87 y.o. male seen for follow up CHF.   He has a  PMH of CAD s/p CABG (LIMA to LAD, SVG to PLA and SVG to PDA), HTN, HLD, chronic combined systolic and diastolic HF, IDDM, stage III CKD, gout, UTI, OSA on CPAP and dietary noncompliance. EF was 25-30% on echocardiogram in December 2013. Cardiac catheterization  on 09/04/2012  showed diffuse 50% mid left main stenosis into 75% distal left main, diffuse 50% left circumflex stenosis, mid RCA was totally occluded, widely patent SVG to PDA, patent SVG to PLA with mild in-stent restenosis in the proximal aspect of the graft, patent LIMA to LAD. Echocardiogram obtained on 01/10/2014 showed EF has improved to 35-40%, moderate LVH, mild MR, PA peak pressure 50 mmHg.   He was  admitted with chest pain on 05/21/2017. Serial troponin was borderline. He eventually underwent Myoview on 05/22/2017 which showed a small area of questionable ischemia versus attenuation artifact but overall felt to be low risk.  He continued to have symptoms so underwent cardiac cath that showed occlusion of the RCA and LAD but all grafts were patent.   He was admitted in January 2019 with gallstone pancreatitis treated with ERCP.   He was admitted in March 2021 with cellulitis of the right foot with osteomyelitis. Angiogram showed severe anterior tibial disease with occlusion of the peroneal and PT vessels. Treated medically. He did have AKI with creatinine up to 2.86 before declining to 2.17 with IV hydration and holding diuretics. Post DC he noted significant weight gain and edema and lasix was resumed.   He was seen on 01/17/20. Labs revealed Hgb 11.1. creatinine improved to 1.64. BNP 624. Echo was updated as noted below and was really unchanged from prior.  He has seen Dr Oneida Alar and follow up  MRI showed persistent osteomyelitis of the fifth metatarsal.   When seen in May 2021 he was volume overloaded. We added metolazone 2.5 mg twice a week to lasix 80 mg bid. On 5/17 he underwent amputation of his right 5th toe. When seen 03/12/20 his weight was down to 203 lbs and he had stopped taking metolazone. We recommended switching lasix to torsemide and resumed metolazone 3x/week but on subsequent follow up it was unclear whether patient was taking as recommended. He states he has been on lasix 80 mg daily since December.   He states he was seen at the [place] this morning and was told he was dehydrated and needed to go to the ED. His BUN was 123 with creatinine 3.04. potassium was 5. He does feel a little dizzy today. Denies any increase in swelling and breathing is stable. Denies angina. No sores on his feet. Reports seeing a hematologist and nephrologist at the [place]. Has chronic anemia and thrombocytopenia.    Past Medical History:  Diagnosis Date  . Acute biliary pancreatitis 10/2017   with choledocholithiasis.  ERCP, stone extraction performed  . Arthritis   . CAD (coronary artery disease)    a. 1997 CABGx3 (VG->RPDA, VG->PLV, LIMA->LAD);  b. 07/2004 PCI VG->PLV (3.5x16 Taxus DES, 3.5x12 Taxus DES); c. 11/2009 PCI: VG->PLV 3.5x12 Promus DES), VG->RPDA (3.5x15 Promus DES); d. 08/2012 Cath: patent grafts; e. 05/2013 Lexiscan MV: no ischemia/infarction, EF  44%.  . Cancer ([place]) 2005   colon surgery done  . Cataract   . Chronic combined systolic and diastolic CHF (congestive heart failure) ([place]) 08/2012   a. EF 50-55% 11/2011; b. 08/2012 - EF 25-30%,  with mild RM, mod TR, mod RAE & LAE, moderate reduced RV systolic function and PASP of 66mmHg. - > cath showed patent grafts & Mod Puml HTN wth elevated PCWP;  c. 01/2014 Echo: EF 35-40%, mod LVH, mod HK, mildly dil LA, mod dil RA, PASP 60mmHg.  . CKD (chronic kidney disease), stage III ([place])   . Dyslipidemia   . GERD  (gastroesophageal reflux disease)   . Gout   . History of home oxygen therapy    uses oxygen nightly with cpap   . Hyperkalemia   . Hypertension   . Insulin Dependent Diabetes mellitus    type 2  . Myocardial infarction ([place]) 1997  . PAD (peripheral artery disease) ([place])   . Sleep apnea   . Thrombocytopenia ([place]) 2011   dates back to 2011  . UTI (lower urinary tract infection) 08/2012   E.coli    Past Surgical History:  Procedure Laterality Date  . ABDOMINAL AORTOGRAM W/LOWER EXTREMITY Right 01/04/2020   Abdominal aortogram with right lower extremity runoff  . ABDOMINAL AORTOGRAM W/LOWER EXTREMITY Bilateral 01/04/2020   Procedure: ABDOMINAL AORTOGRAM W/LOWER EXTREMITY;  Surgeon: Elam Dutch, MD;  Location: [facility];  Service: Vascular;  Laterality: Bilateral;  . AMPUTATION Right 02/25/2020   Procedure: RIGHT FIFTH TOE AMPUTATION;  Surgeon: Elam Dutch, MD;  Location: [facility] OR;  Service: Vascular;  Laterality: Right;  . AMPUTATION Right 10/06/2020   Procedure: RIGHT FOURTH TOE AMPUTATION WITH RESECTION OF METATARSAL HEAD;  Surgeon: Elam Dutch, MD;  Location: [facility];  Service: Vascular;  Laterality: Right;  . APPENDECTOMY    . BACK SURGERY     15 years ago lower  . CARDIAC CATHETERIZATION  2005   stenting of the vein graft to the posterior lateral per -- Dr. Martinique      . CARDIAC CATHETERIZATION  2011   showed right coronary, totally occluded LAD and severe stenosis in both saphenous vein graft to the posterior lateral and posterior descending  -- stenting of the proximal portion of the SVG to the posterior lateral branch in 2/11  . CHOLECYSTECTOMY    . COLONOSCOPY     x several - colon polyps  . CORONARY ARTERY BYPASS GRAFT  1997    (LIMA to LAD, SVG to PDA, SVG to PL)  . ERCP N/A 10/17/2017    Irene Shipper, MD;  [facility] ENDOSCOPY; choledocholithiasis, biliary pancreatitis.  s/p sphincterotomy and stone extraction.    . ESOPHAGOGASTRODUODENOSCOPY (EGD) WITH  PROPOFOL N/A 09/21/2017   Procedure: ESOPHAGOGASTRODUODENOSCOPY (EGD) WITH PROPOFOL;  Surgeon: Doran Stabler, MD;  Location: [facility] ENDOSCOPY;  Service: Gastroenterology;  Laterality: N/A;  . EYE SURGERY     ioc for cataract  . HERNIA REPAIR    . LEFT AND RIGHT HEART CATHETERIZATION WITH CORONARY/GRAFT ANGIOGRAM  09/04/2012   Procedure: LEFT AND RIGHT HEART CATHETERIZATION WITH Beatrix Fetters;  Surgeon: Sherren Mocha, MD;  Location: [facility] CATH LAB;  patent grafts, mod 2ndary pulmonary HTN, elevated LV EDP & PCWP  . LEFT HEART CATH AND CORS/GRAFTS ANGIOGRAPHY N/A 06/17/2017   Procedure: LEFT HEART CATH AND CORS/GRAFTS ANGIOGRAPHY;  Surgeon: Martinique, Oumou Smead M, MD;  Location: [facility];  Service: Cardiovascular;  Laterality: N/A;  . mohs  x several  . PARTIAL COLECTOMY     cancerous polyps  . stent to heart  last done 5 to 6 yrs ago   x 4  . UPPER GI ENDOSCOPY      Current Medications: Outpatient Medications Prior to Visit  Medication Sig Dispense Refill  . allopurinol (ZYLOPRIM) 100 MG tablet Take 1 tablet (100 mg total) by mouth daily. (Patient taking differently: Take 100 mg by mouth 2 (two) times daily.) 60 tablet 1  . ALPRAZolam (XANAX) 0.25 MG tablet Take 1 tablet at bedtime as needed for sleep 30 tablet 1  . aspirin 81 MG EC tablet Take 81 mg by mouth at bedtime.    . bismuth subsalicylate (PEPTO BISMOL) 262 MG/15ML suspension Take 30 mLs by mouth every 6 (six) hours as needed for indigestion.    . Cholecalciferol (VITAMIN D-3) 25 MCG (1000 UT) CAPS Take 2,000 Units by mouth every other day.     . clopidogrel (PLAVIX) 75 MG tablet Take 1 tablet by mouth daily.    . Febuxostat 80 MG TABS TAKE ONE-HALF TABLET BY MOUTH DAILY *NOTE CHANGE IN DIRECTIONS*    . ferrous sulfate 325 (65 FE) MG tablet Take 325 mg by mouth every other day.    . fluticasone (FLONASE) 50 MCG/ACT nasal spray Place 2 sprays into both nostrils daily as needed for allergies or rhinitis.    .  furosemide (LASIX) 80 MG tablet TAKE ONE TABLET BY MOUTH DAILY (INCREASED DOSE)    . gabapentin (NEURONTIN) 300 MG capsule Take 1 capsule by mouth 2 (two) times daily.    . insulin aspart protamine- aspart (NOVOLOG MIX 70/30) (70-30) 100 UNIT/ML injection Inject 30 Units into the skin 2 (two) times daily with a meal.     . latanoprost (XALATAN) 0.005 % ophthalmic solution Place 1 drop into both eyes at bedtime.    . Multiple Vitamin (MULTIVITAMIN) tablet Take 1 tablet by mouth daily.    . nitroGLYCERIN (NITROSTAT) 0.4 MG SL tablet Place 1 tablet (0.4 mg total) under the tongue every 5 (five) minutes as needed for chest pain (up to 3 doses). 25 tablet 3  . oxyCODONE-acetaminophen (PERCOCET/ROXICET) 5-325 MG tablet Take 1-2 tablets by mouth every 6 (six) hours as needed for severe pain. 30 tablet 0  . pantoprazole (PROTONIX) 40 MG tablet Take 1 tablet (40 mg total) by mouth 2 (two) times daily. (Patient taking differently: Take 40 mg by mouth daily.) 180 tablet 3  . PRESCRIPTION MEDICATION CPAP: At bedtime    . tamsulosin (FLOMAX) 0.4 MG CAPS capsule Take 0.4 mg by mouth daily after breakfast.     . atorvastatin (LIPITOR) 80 MG tablet Take 80 mg by mouth daily.     . colchicine 0.6 MG tablet Take 1 tablet (0.6 mg total) by mouth daily as needed (Gout). 90 tablet 3  . levofloxacin (LEVAQUIN) 500 MG tablet Take 1 tablet (500 mg total) by mouth daily. 14 tablet 0  . losartan (COZAAR) 100 MG tablet Take 100 mg by mouth daily.    . carvedilol (COREG) 12.5 MG tablet Take 1 tablet (12.5 mg total) by mouth 2 (two) times daily with a meal. 60 tablet 6  . isosorbide mononitrate (IMDUR) 30 MG 24 hr tablet Take 30 mg by mouth daily.    Marland Kitchen torsemide (DEMADEX) 20 MG tablet Take 2 tablets (40 mg total) by mouth 2 (two) times daily. 360 tablet 3   No facility-administered medications prior to visit.     Allergies:  Patient has no known allergies.   Social History   Socioeconomic History  . Marital status:  Single    Spouse name: Not on file  . Number of children: 0  . Years of education: Not on file  . Highest education level: Not on file  Occupational History  . Occupation: retired   ran a Office manager course  Tobacco Use  . Smoking status: Former Smoker    Packs/day: 1.50    Years: 35.00    Pack years: 52.50    Types: Cigarettes    Quit date: 10/12/1983    Years since quitting: 37.4  . Smokeless tobacco: Never Used  Vaping Use  . Vaping Use: Never used  Substance and Sexual Activity  . Alcohol use: Yes    Alcohol/week: 3.0 - 4.0 standard drinks    Types: 3 - 4 Standard drinks or equivalent per week  . Drug use: No  . Sexual activity: Not Currently    Birth control/protection: None  Other Topics Concern  . Not on file  Social History Narrative   Lives in [place] by himself.  Active but does not routinely exercise.   Social Determinants of Health   Financial Resource Strain: Not on file  Food Insecurity: Not on file  Transportation Needs: Not on file  Physical Activity: Not on file  Stress: Not on file  Social Connections: Not on file     Family History:  The patient's family history includes Asthma in his brother; Cardiomyopathy in his mother; Coronary artery disease in his brother and father; Heart attack in his father; Kidney disease in his sister.   ROS:   Please see the history of present illness.    ROS All other systems reviewed and are negative.   PHYSICAL EXAM:   VS:  BP (!) 122/58 (BP Location: Right Arm, Patient Position: Sitting, Cuff Size: Normal)   Pulse (!) 52   Ht 6' (1.829 m)   Wt 202 lb 9.6 oz (91.9 kg)   BMI 27.48 kg/m    GEN: Well nourished, chronically ill appearing, in no acute distress  HEENT: normal  Neck: no JVD, carotid bruits, or masses Cardiac: RRR; no murmurs, rubs, or gallops, no edema. Respiratory:  clear to auscultation bilaterally, normal work of breathing GI: soft,  distended with old hernia, + BS MS: no deformity or atrophy  Skin: warm  and dry, no rash Neuro:  Alert and Oriented x 3, Strength and sensation are intact Psych: euthymic mood, full affect  Wt Readings from Last 3 Encounters:  03/02/21 202 lb 9.6 oz (91.9 kg)  12/04/20 204 lb 9.6 oz (92.8 kg)  12/01/20 201 lb (91.2 kg)      Studies/Labs Reviewed:   EKG:  EKG is ordered today.  NSR with first degree AV block. Nonspecific IVCD, T wave inversion in inferolateral leads. I have personally reviewed and interpreted this study.    Recent Labs: 05/08/2020: Platelets 103 09/12/2020: ALT 16; BNP 796.8 10/06/2020: BUN 85; Creatinine, Ser 2.50; Hemoglobin 12.2; Potassium 4.0; Sodium 140   Lipid Panel    Component Value Date/Time   CHOL 72 (L) 09/12/2020 1226   TRIG 49 09/12/2020 1226   HDL 40 09/12/2020 1226   CHOLHDL 1.8 09/12/2020 1226   CHOLHDL 3.0 05/22/2017 0359   VLDL 42 (H) 05/22/2017 0359   LDLCALC 19 09/12/2020 1226   LDLDIRECT 29.8 08/06/2013 0849   Dated 01/2920: BUN 48, creatinine 2.15. potassium 5.5.   Additional studies/ records that were reviewed today include:  Cath 09/04/2012 Procedural Findings: Hemodynamics RA 13 RV 69/15 PA 64/21 mean 37 PCWP a wave 21 v wave 36, mean 21 LV 143/22 AO 143/60 mean 93  Oxygen saturations: PA 72 AO 91  Cardiac Output (Fick) 9.2  Cardiac Index (Fick) 4.3           Coronary angiography: Coronary dominance: right  Left mainstem: Left main is long. There is diffuse 50% mid left main stenosis into 75% distal left main.  Left anterior descending (LAD): The LAD is totally occluded in its proximal aspect.  Left circumflex (LCx): The left circumflex is patent there is diffuse 50% proximal stenosis. There are 2 patent obtuse marginal branches.  Right coronary artery (RCA): The RCA is heavily calcified and severely diseased. The mid vessel is totally occluded.  Saphenous vein graft to PDA: Widely patent with patent stents in the proximal body of the graft.  Saphenous vein graft to PLA is  patent there is mild in-stent restenosis in the proximal aspect of the graft but there is no significant disease noted. The PLA is a large branch with no significant stenosis  LIMA to LAD: Widely patent with no significant stenosis. The native vessel territory is more consistent with the diagonal territory than a true LAD and I suspect the native LAD is an anatomic variant the doesn't reach the left ventricular apex.  Left ventriculography: Deferred because of chronic kidney disease  Final Conclusions:   1. Severe three-vessel native coronary artery disease 2. Continued patency of the saphenous vein graft to PDA, saphenous vein graft to PLA, and LIMA to LAD 3. Moderate pulmonary hypertension likely related to left heart disease 4. Elevated left ventricular and pulmonary wedge pressure with large V waves  Recommendations: Continued medical therapy for treatment of congestive heart failure.    Echo 01/10/2014 - Left ventricle: The cavity size was moderately dilated. Wall thickness was increased in a pattern of moderate LVH. There was focal basal hypertrophy. Systolic function was moderately reduced. The estimated ejection fraction was in the range of 35% to 40%. Moderate hypokinesis. - Mitral valve: Mild regurgitation. - Left atrium: The atrium was mildly dilated. - Right atrium: The atrium was mildly to moderately dilated. - Pulmonary arteries: Systolic pressure was moderately increased. PA peak pressure: 71mm Hg (S).   Cardiac cath 06/17/17:  LEFT HEART CATH AND CORS/GRAFTS ANGIOGRAPHY  Conclusion    Prox RCA to Dist RCA lesion, 100 %stenosed.  Prox LAD lesion, 100 %stenosed.  LIMA graft was visualized by angiography and is large and anatomically normal.  SVG graft was visualized by angiography and is normal in caliber.  Origin to Prox Graft lesion, 0 %stenosed.  SVG graft was visualized by angiography and is normal in caliber.  Origin lesion, 30  %stenosed.  Origin to Prox Graft lesion, 0 %stenosed.  There is moderate to severe left ventricular systolic dysfunction.  LV end diastolic pressure is normal.   1. Severe 2 vessel occlusive CAD.    - 100% proximal LAD    - 100% mid RCA 2. Patent LIMA to the LAD 3. Patent SVG to PDA 4. Patent SVG to PLOM 5. Moderate to severe LV dysfunction. EF estimated at 35%. 6. Normal LVEDP.  Plan: continue medical therapy. Consider GI referral for evaluation of his pain.    Echo 02/04/20: IMPRESSIONS    1. Left ventricular ejection fraction, by estimation, is 35%%. The left  ventricle has severely decreased function. The left ventricle demonstrates  global hypokinesis. The left ventricular internal cavity  size was severely  dilated. Left ventricular  diastolic parameters are indeterminate.  2. Right ventricular systolic function is mildly reduced. The right  ventricular size is mildly enlarged. There is moderately elevated  pulmonary artery systolic pressure.  3. Left atrial size was mild to moderately dilated.  4. Right atrial size was moderately dilated.  5. The mitral valve is abnormal. Mild to moderate mitral valve  regurgitation.  6. The aortic valve is abnormal. Aortic valve regurgitation is mild. Mild  aortic valve sclerosis is present, with no evidence of aortic valve  stenosis.  7. Aortic dilatation noted. There is mild dilatation of the ascending  aorta measuring 41 mm.  8. The inferior vena cava is dilated in size with <50% respiratory  variability, suggesting right atrial pressure of 15 mmHg.   Comparison(s): The left ventricular function is unchanged.   ASSESSMENT:    1. Chronic combined systolic and diastolic heart failure ([place])   2. Coronary artery disease involving coronary bypass graft of native heart without angina pectoris   3. Essential hypertension   4. Acute renal failure superimposed on stage 4 chronic kidney disease, unspecified acute renal  failure type ([place])   5. Hyperlipidemia LDL goal <70   6. PAD (peripheral artery disease) ([place])      PLAN:    1.CAD s/p CABG: Negative Myoview on 05/22/2017. Cardiac cath in 2018 showed patent grafts. He is asymptomatic. Continue ASA, statin, Coreg.   2. Chronic combined systolic/diastolic CHF. His weight is down 2 lbs and edema is down. By his lab work he does appear to be volume depleted. He does not want to go to the ED. I recommend holding lasix for 3 days then resume at lower dose of 40 mg daily. Will discontinue losartan since this may be having a negative effect on his renal function. He is scheduled to have lab work repeated at the [place] next week. I did reconcile his medication list that was provided by the [place].    3. Hyperlipidemia: On Lipitor 20 mg daily per the [place].    4. IDDM: On insulin. Per primary care.  5. HTN controlled.  6. CKD stage IV. See above.   7.  OSA on CPAP: continue current therapy  8. Osteomyelitis right foot. S/p toe amputation.   9. History of gout. Not active.   10. PAD with severe tibial disease. Managed by Dr Oneida Alar.   11. Anemia of chronic disease with thrombocytopenia. Per hematology.   Follow up in 3 months    Signed, Mindy Gali Martinique, MD  03/02/2021 1:56 PM    [facility] [address] [place] [address], [place], [place]  [address] Phone: [phone]; Fax: [phone]

## 2021-03-02 ENCOUNTER — Ambulatory Visit: Payer: Medicare HMO | Admitting: Cardiology

## 2021-03-02 ENCOUNTER — Encounter: Payer: Self-pay | Admitting: Cardiology

## 2021-03-02 ENCOUNTER — Other Ambulatory Visit: Payer: Self-pay

## 2021-03-02 VITALS — BP 122/58 | HR 52 | Ht 72.0 in | Wt 202.6 lb

## 2021-03-02 DIAGNOSIS — N179 Acute kidney failure, unspecified: Secondary | ICD-10-CM

## 2021-03-02 DIAGNOSIS — I5042 Chronic combined systolic (congestive) and diastolic (congestive) heart failure: Secondary | ICD-10-CM | POA: Diagnosis not present

## 2021-03-02 DIAGNOSIS — I10 Essential (primary) hypertension: Secondary | ICD-10-CM | POA: Diagnosis not present

## 2021-03-02 DIAGNOSIS — N184 Chronic kidney disease, stage 4 (severe): Secondary | ICD-10-CM

## 2021-03-02 DIAGNOSIS — E785 Hyperlipidemia, unspecified: Secondary | ICD-10-CM

## 2021-03-02 DIAGNOSIS — I739 Peripheral vascular disease, unspecified: Secondary | ICD-10-CM | POA: Diagnosis not present

## 2021-03-02 DIAGNOSIS — I25810 Atherosclerosis of coronary artery bypass graft(s) without angina pectoris: Secondary | ICD-10-CM

## 2021-03-02 MED ORDER — CARVEDILOL 25 MG PO TABS: 25.0000 mg | ORAL_TABLET | Freq: Two times a day (BID) | ORAL | 3 refills | Status: DC

## 2021-03-02 MED ORDER — ATORVASTATIN CALCIUM 20 MG PO TABS: 20.0000 mg | ORAL_TABLET | Freq: Every day | ORAL | 3 refills | Status: AC

## 2021-03-02 NOTE — Patient Instructions (Signed)
Stop taking losartan  Stop taking lasix for 3 days then resume 40 mg daily.   Have your lab work repeated next week.

## 2021-03-04 NOTE — Addendum Note (Signed)
Addended by: Georgina Pillion on: 03/04/2021 02:30 PM   Modules accepted: Orders

## 2021-03-09 ENCOUNTER — Other Ambulatory Visit: Payer: Self-pay

## 2021-03-09 ENCOUNTER — Inpatient Hospital Stay (HOSPITAL_COMMUNITY)
Admission: EM | Admit: 2021-03-09 | Discharge: 2021-03-16 | DRG: 378 | Disposition: A | Discharge status: Skilled Nursing Facility | Payer: Medicare HMO | Attending: Internal Medicine | Admitting: Internal Medicine

## 2021-03-09 ENCOUNTER — Emergency Department (HOSPITAL_COMMUNITY): Payer: Medicare HMO

## 2021-03-09 DIAGNOSIS — N1832 Chronic kidney disease, stage 3b: Secondary | ICD-10-CM | POA: Diagnosis present

## 2021-03-09 DIAGNOSIS — Z841 Family history of disorders of kidney and ureter: Secondary | ICD-10-CM

## 2021-03-09 DIAGNOSIS — Z794 Long term (current) use of insulin: Secondary | ICD-10-CM

## 2021-03-09 DIAGNOSIS — I272 Pulmonary hypertension, unspecified: Secondary | ICD-10-CM | POA: Diagnosis present

## 2021-03-09 DIAGNOSIS — R778 Other specified abnormalities of plasma proteins: Secondary | ICD-10-CM

## 2021-03-09 DIAGNOSIS — E1122 Type 2 diabetes mellitus with diabetic chronic kidney disease: Secondary | ICD-10-CM | POA: Diagnosis present

## 2021-03-09 DIAGNOSIS — I959 Hypotension, unspecified: Secondary | ICD-10-CM | POA: Diagnosis present

## 2021-03-09 DIAGNOSIS — I130 Hypertensive heart and chronic kidney disease with heart failure and stage 1 through stage 4 chronic kidney disease, or unspecified chronic kidney disease: Secondary | ICD-10-CM | POA: Diagnosis present

## 2021-03-09 DIAGNOSIS — E785 Hyperlipidemia, unspecified: Secondary | ICD-10-CM | POA: Diagnosis present

## 2021-03-09 DIAGNOSIS — Z9989 Dependence on other enabling machines and devices: Secondary | ICD-10-CM | POA: Diagnosis not present

## 2021-03-09 DIAGNOSIS — K259 Gastric ulcer, unspecified as acute or chronic, without hemorrhage or perforation: Secondary | ICD-10-CM

## 2021-03-09 DIAGNOSIS — Z8249 Family history of ischemic heart disease and other diseases of the circulatory system: Secondary | ICD-10-CM

## 2021-03-09 DIAGNOSIS — M6281 Muscle weakness (generalized): Secondary | ICD-10-CM | POA: Diagnosis not present

## 2021-03-09 DIAGNOSIS — M109 Gout, unspecified: Secondary | ICD-10-CM | POA: Diagnosis present

## 2021-03-09 DIAGNOSIS — E1151 Type 2 diabetes mellitus with diabetic peripheral angiopathy without gangrene: Secondary | ICD-10-CM | POA: Diagnosis present

## 2021-03-09 DIAGNOSIS — R2681 Unsteadiness on feet: Secondary | ICD-10-CM | POA: Diagnosis not present

## 2021-03-09 DIAGNOSIS — N189 Chronic kidney disease, unspecified: Secondary | ICD-10-CM | POA: Diagnosis not present

## 2021-03-09 DIAGNOSIS — K219 Gastro-esophageal reflux disease without esophagitis: Secondary | ICD-10-CM | POA: Diagnosis present

## 2021-03-09 DIAGNOSIS — K3189 Other diseases of stomach and duodenum: Secondary | ICD-10-CM | POA: Diagnosis not present

## 2021-03-09 DIAGNOSIS — Z86008 Personal history of in-situ neoplasm of other site: Secondary | ICD-10-CM

## 2021-03-09 DIAGNOSIS — G4733 Obstructive sleep apnea (adult) (pediatric): Secondary | ICD-10-CM | POA: Diagnosis not present

## 2021-03-09 DIAGNOSIS — I340 Nonrheumatic mitral (valve) insufficiency: Secondary | ICD-10-CM | POA: Diagnosis present

## 2021-03-09 DIAGNOSIS — Z20822 Contact with and (suspected) exposure to covid-19: Secondary | ICD-10-CM | POA: Diagnosis present

## 2021-03-09 DIAGNOSIS — R7989 Other specified abnormal findings of blood chemistry: Secondary | ICD-10-CM | POA: Diagnosis present

## 2021-03-09 DIAGNOSIS — R1312 Dysphagia, oropharyngeal phase: Secondary | ICD-10-CM | POA: Diagnosis not present

## 2021-03-09 DIAGNOSIS — S2231XA Fracture of one rib, right side, initial encounter for closed fracture: Secondary | ICD-10-CM | POA: Diagnosis not present

## 2021-03-09 DIAGNOSIS — Z7982 Long term (current) use of aspirin: Secondary | ICD-10-CM

## 2021-03-09 DIAGNOSIS — I10 Essential (primary) hypertension: Secondary | ICD-10-CM | POA: Diagnosis not present

## 2021-03-09 DIAGNOSIS — D696 Thrombocytopenia, unspecified: Secondary | ICD-10-CM | POA: Diagnosis present

## 2021-03-09 DIAGNOSIS — R0602 Shortness of breath: Secondary | ICD-10-CM | POA: Diagnosis present

## 2021-03-09 DIAGNOSIS — Z79899 Other long term (current) drug therapy: Secondary | ICD-10-CM

## 2021-03-09 DIAGNOSIS — E875 Hyperkalemia: Secondary | ICD-10-CM | POA: Diagnosis present

## 2021-03-09 DIAGNOSIS — R9431 Abnormal electrocardiogram [ECG] [EKG]: Secondary | ICD-10-CM

## 2021-03-09 DIAGNOSIS — D631 Anemia in chronic kidney disease: Secondary | ICD-10-CM | POA: Diagnosis not present

## 2021-03-09 DIAGNOSIS — C4490 Unspecified malignant neoplasm of skin, unspecified: Secondary | ICD-10-CM | POA: Diagnosis present

## 2021-03-09 DIAGNOSIS — J8410 Pulmonary fibrosis, unspecified: Secondary | ICD-10-CM | POA: Diagnosis present

## 2021-03-09 DIAGNOSIS — E1136 Type 2 diabetes mellitus with diabetic cataract: Secondary | ICD-10-CM | POA: Diagnosis present

## 2021-03-09 DIAGNOSIS — D649 Anemia, unspecified: Secondary | ICD-10-CM | POA: Diagnosis not present

## 2021-03-09 DIAGNOSIS — Z66 Do not resuscitate: Secondary | ICD-10-CM | POA: Diagnosis present

## 2021-03-09 DIAGNOSIS — K254 Chronic or unspecified gastric ulcer with hemorrhage: Secondary | ICD-10-CM | POA: Diagnosis present

## 2021-03-09 DIAGNOSIS — N179 Acute kidney failure, unspecified: Secondary | ICD-10-CM | POA: Diagnosis present

## 2021-03-09 DIAGNOSIS — D62 Acute posthemorrhagic anemia: Secondary | ICD-10-CM | POA: Diagnosis present

## 2021-03-09 DIAGNOSIS — K922 Gastrointestinal hemorrhage, unspecified: Secondary | ICD-10-CM | POA: Diagnosis not present

## 2021-03-09 DIAGNOSIS — M47814 Spondylosis without myelopathy or radiculopathy, thoracic region: Secondary | ICD-10-CM | POA: Diagnosis not present

## 2021-03-09 DIAGNOSIS — Z87891 Personal history of nicotine dependence: Secondary | ICD-10-CM

## 2021-03-09 DIAGNOSIS — J9611 Chronic respiratory failure with hypoxia: Secondary | ICD-10-CM | POA: Diagnosis present

## 2021-03-09 DIAGNOSIS — N184 Chronic kidney disease, stage 4 (severe): Secondary | ICD-10-CM | POA: Diagnosis not present

## 2021-03-09 DIAGNOSIS — R262 Difficulty in walking, not elsewhere classified: Secondary | ICD-10-CM | POA: Diagnosis not present

## 2021-03-09 DIAGNOSIS — R195 Other fecal abnormalities: Secondary | ICD-10-CM | POA: Diagnosis not present

## 2021-03-09 DIAGNOSIS — Z951 Presence of aortocoronary bypass graft: Secondary | ICD-10-CM

## 2021-03-09 DIAGNOSIS — I248 Other forms of acute ischemic heart disease: Secondary | ICD-10-CM | POA: Diagnosis present

## 2021-03-09 DIAGNOSIS — D509 Iron deficiency anemia, unspecified: Secondary | ICD-10-CM | POA: Diagnosis present

## 2021-03-09 DIAGNOSIS — I5043 Acute on chronic combined systolic (congestive) and diastolic (congestive) heart failure: Secondary | ICD-10-CM | POA: Diagnosis present

## 2021-03-09 DIAGNOSIS — M1990 Unspecified osteoarthritis, unspecified site: Secondary | ICD-10-CM | POA: Diagnosis present

## 2021-03-09 DIAGNOSIS — E119 Type 2 diabetes mellitus without complications: Secondary | ICD-10-CM | POA: Diagnosis not present

## 2021-03-09 DIAGNOSIS — R279 Unspecified lack of coordination: Secondary | ICD-10-CM | POA: Diagnosis not present

## 2021-03-09 DIAGNOSIS — I5042 Chronic combined systolic (congestive) and diastolic (congestive) heart failure: Secondary | ICD-10-CM | POA: Diagnosis present

## 2021-03-09 DIAGNOSIS — Z825 Family history of asthma and other chronic lower respiratory diseases: Secondary | ICD-10-CM

## 2021-03-09 DIAGNOSIS — K2980 Duodenitis without bleeding: Secondary | ICD-10-CM | POA: Diagnosis present

## 2021-03-09 DIAGNOSIS — I2510 Atherosclerotic heart disease of native coronary artery without angina pectoris: Secondary | ICD-10-CM | POA: Diagnosis present

## 2021-03-09 DIAGNOSIS — I252 Old myocardial infarction: Secondary | ICD-10-CM

## 2021-03-09 DIAGNOSIS — M954 Acquired deformity of chest and rib: Secondary | ICD-10-CM | POA: Diagnosis not present

## 2021-03-09 DIAGNOSIS — R918 Other nonspecific abnormal finding of lung field: Secondary | ICD-10-CM | POA: Diagnosis not present

## 2021-03-09 DIAGNOSIS — I255 Ischemic cardiomyopathy: Secondary | ICD-10-CM | POA: Diagnosis present

## 2021-03-09 LAB — COMPREHENSIVE METABOLIC PANEL
ALT: 17 U/L (ref 0–44)
AST: 20 U/L (ref 15–41)
Albumin: 3.2 g/dL — ABNORMAL LOW (ref 3.5–5.0)
Alkaline Phosphatase: 73 U/L (ref 38–126)
Anion gap: 12 (ref 5–15)
BUN: 157 mg/dL — ABNORMAL HIGH (ref 8–23)
CO2: 19 mmol/L — ABNORMAL LOW (ref 22–32)
Calcium: 8.9 mg/dL (ref 8.9–10.3)
Chloride: 103 mmol/L (ref 98–111)
Creatinine, Ser: 3.06 mg/dL — ABNORMAL HIGH (ref 0.61–1.24)
GFR, Estimated: 19 mL/min — ABNORMAL LOW (ref >60)
Glucose, Bld: 209 mg/dL — ABNORMAL HIGH (ref 70–99)
Potassium: 5.4 mmol/L — ABNORMAL HIGH (ref 3.5–5.1)
Sodium: 134 mmol/L — ABNORMAL LOW (ref 135–145)
Total Bilirubin: 1.1 mg/dL (ref 0.3–1.2)
Total Protein: 6.9 g/dL (ref 6.5–8.1)

## 2021-03-09 LAB — CBC WITH DIFFERENTIAL/PLATELET
Abs Immature Granulocytes: 0.05 10*3/uL (ref 0.00–0.07)
Basophils Absolute: 0.1 10*3/uL (ref 0.0–0.1)
Basophils Relative: 1 %
Eosinophils Absolute: 0.2 10*3/uL (ref 0.0–0.5)
Eosinophils Relative: 2 %
HCT: 20.8 % — ABNORMAL LOW (ref 39.0–52.0)
Hemoglobin: 6.1 g/dL — CL (ref 13.0–17.0)
Immature Granulocytes: 1 %
Lymphocytes Relative: 14 %
Lymphs Abs: 1.1 10*3/uL (ref 0.7–4.0)
MCH: 29.8 pg (ref 26.0–34.0)
MCHC: 29.3 g/dL — ABNORMAL LOW (ref 30.0–36.0)
MCV: 101.5 fL — ABNORMAL HIGH (ref 80.0–100.0)
Monocytes Absolute: 1 10*3/uL (ref 0.1–1.0)
Monocytes Relative: 13 %
Neutro Abs: 5.3 10*3/uL (ref 1.7–7.7)
Neutrophils Relative %: 69 %
Platelets: 86 10*3/uL — ABNORMAL LOW (ref 150–400)
RBC: 2.05 MIL/uL — ABNORMAL LOW (ref 4.22–5.81)
RDW: 17.9 % — ABNORMAL HIGH (ref 11.5–15.5)
WBC: 7.6 10*3/uL (ref 4.0–10.5)
nRBC: 0 % (ref 0.0–0.2)

## 2021-03-09 LAB — RESP PANEL BY RT-PCR (FLU A&B, COVID) ARPGX2
Influenza A by PCR: NEGATIVE
Influenza B by PCR: NEGATIVE
SARS Coronavirus 2 by RT PCR: NEGATIVE

## 2021-03-09 LAB — ABO/RH: ABO/RH(D): O POS

## 2021-03-09 LAB — POC OCCULT BLOOD, ED: Fecal Occult Bld: POSITIVE — AB

## 2021-03-09 LAB — PREPARE RBC (CROSSMATCH)

## 2021-03-09 LAB — TROPONIN I (HIGH SENSITIVITY)
Troponin I (High Sensitivity): 364 ng/L (ref <18)
Troponin I (High Sensitivity): 331 ng/L (ref <18)

## 2021-03-09 MED ORDER — SODIUM CHLORIDE 0.9 % IV SOLN: INTRAVENOUS | Status: DC

## 2021-03-09 MED ORDER — ONDANSETRON HCL 4 MG/2ML IJ SOLN
4.0000 mg | Freq: Once | INTRAMUSCULAR | Status: AC
  Administered 2021-03-09: 4 mg via INTRAVENOUS
  Filled 2021-03-09: qty 2

## 2021-03-09 MED ORDER — SODIUM CHLORIDE 0.9 % IV SOLN
10.0000 mL/h | Freq: Once | INTRAVENOUS | Status: AC
  Administered 2021-03-10: 10 mL/h via INTRAVENOUS

## 2021-03-09 MED ORDER — SODIUM CHLORIDE 0.9 % IV SOLN
8.0000 mg/h | INTRAVENOUS | Status: DC
  Administered 2021-03-09 – 2021-03-11 (×4): 8 mg/h via INTRAVENOUS
  Filled 2021-03-09 (×5): qty 80

## 2021-03-09 NOTE — Progress Notes (Signed)
Brief note regarding plan, with full H&P to follow:  87 year old male who is admitted with suspected acute upper gastrointestinal bleed complicated by acute blood loss anemia after presenting with shortness of breath over the last few days and associated with new development of dark-colored stool over that time.  Hemoccult positive in the ED.  Denies any associated chest pain.  Hemoglobin 6.1.  Typed and screened.  Order placed to transfuse 2 units PRBC.  Every 4 hour H&H trending has been ordered through 9 AM tomorrow (03/10/21). GI consulted.  Protonix drip ordered.  NPO.  Mildly elevated troponin felt to represent supply demand mismatch in the setting of diminished oxygen delivery capacity as a consequence of presenting acute blood loss anemia.    Babs Bertin, DO Hospitalist

## 2021-03-09 NOTE — ED Notes (Signed)
Blood consent form signed.

## 2021-03-09 NOTE — ED Provider Notes (Signed)
[facility] EMERGENCY DEPARTMENT Provider Note   CSN: [id number] Arrival date & time: 03/09/21  1611     History Chief Complaint  Patient presents with  . Shortness of Breath    Scott [name] is a 87 y.o. male.  HPI Patient presents for evaluation of shortness of breath.  He was seen at the [place], 5 days ago, also saw his cardiologist that day and was told to hold his Lasix for 3 days.  His Lasix was held because of creatinine climbing to 3.  He complains of shortness of breath for 3 days.  He is unsure if his weight is going up or coming down.  He feels more short of breath with exertion.  He denies chest pain, focal weakness or paresthesia.  He is taking all his medications as prescribed.  He has a skin cancer on his right forehead that he keeps covered with a bandage, and is waiting to have surgery on it.  There are no other known active modifying factors    Past Medical History:  Diagnosis Date  . Acute biliary pancreatitis 10/2017   with choledocholithiasis.  ERCP, stone extraction performed  . Arthritis   . CAD (coronary artery disease)    a. 1997 CABGx3 (VG->RPDA, VG->PLV, LIMA->LAD);  b. 07/2004 PCI VG->PLV (3.5x16 Taxus DES, 3.5x12 Taxus DES); c. 11/2009 PCI: VG->PLV 3.5x12 Promus DES), VG->RPDA (3.5x15 Promus DES); d. 08/2012 Cath: patent grafts; e. 05/2013 Lexiscan MV: no ischemia/infarction, EF 44%.  . Cancer [facility]) 2005   colon surgery done  . Cataract   . Chronic combined systolic and diastolic CHF (congestive heart failure) ([place]) 08/2012   a. EF 50-55% 11/2011; b. 08/2012 - EF 25-30%,  with mild RM, mod TR, mod RAE & LAE, moderate reduced RV systolic function and PASP of 31mmHg. - > cath showed patent grafts & Mod Puml HTN wth elevated PCWP;  c. 01/2014 Echo: EF 35-40%, mod LVH, mod HK, mildly dil LA, mod dil RA, PASP 50mmHg.  . CKD (chronic kidney disease), stage III ([place])   . Dyslipidemia   . GERD (gastroesophageal reflux disease)   . Gout   . History of  home oxygen therapy    uses oxygen nightly with cpap   . Hyperkalemia   . Hypertension   . Insulin Dependent Diabetes mellitus    type 2  . Myocardial infarction ([place]) 1997  . PAD (peripheral artery disease) ([place])   . Sleep apnea   . Thrombocytopenia ([place]) 2011   dates back to 2011  . UTI (lower urinary tract infection) 08/2012   E.coli    Patient Active Problem List   Diagnosis Date Noted  . Acute upper GI bleed 03/09/2021  . Osteomyelitis of fifth toe of right foot ([place]) 02/25/2020  . PAD (peripheral artery disease) ([place]) 01/03/2020  . Choledocholithiasis   . Pancreatitis 10/16/2017  . Midsternal chest pain 05/21/2017  . Dupuytren's contracture 02/25/2016  . Nausea - without vomiting 01/01/2015  . Low grade fever 01/01/2015  . DOE (dyspnea on exertion) 01/01/2015  . Unstable angina ([place]) 05/12/2013  . Chronic combined systolic and diastolic heart failure ([place]) 05/12/2013  . Personal history of other malignant neoplasm of skin 04/05/2013  . CKD (chronic kidney disease) stage 3, GFR 30-59 ml/min ([facility]) 09/01/2012  . OSA (obstructive sleep apnea) 07/04/2012  . Dyspnea 11/05/2011  . Diabetes mellitus, type II, insulin dependent ([place]) 04/07/2011  . Hypertension   . Ischemic cardiomyopathy   . Myocardial infarction ([place])   .  Dyslipidemia   . Arteriosclerotic coronary artery disease     Past Surgical History:  Procedure Laterality Date  . ABDOMINAL AORTOGRAM W/LOWER EXTREMITY Right 01/04/2020   Abdominal aortogram with right lower extremity runoff  . ABDOMINAL AORTOGRAM W/LOWER EXTREMITY Bilateral 01/04/2020   Procedure: ABDOMINAL AORTOGRAM W/LOWER EXTREMITY;  Surgeon: Elam Dutch, MD;  Location: [facility] CV LAB;  Service: Vascular;  Laterality: Bilateral;  . AMPUTATION Right 02/25/2020   Procedure: RIGHT FIFTH TOE AMPUTATION;  Surgeon: Elam Dutch, MD;  Location: [facility] OR;  Service: Vascular;  Laterality: Right;  . AMPUTATION Right 10/06/2020   Procedure: RIGHT  FOURTH TOE AMPUTATION WITH RESECTION OF METATARSAL HEAD;  Surgeon: Elam Dutch, MD;  Location: [facility];  Service: Vascular;  Laterality: Right;  . APPENDECTOMY    . BACK SURGERY     15 years ago lower  . CARDIAC CATHETERIZATION  2005   stenting of the vein graft to the posterior lateral per -- Dr. Martinique      . CARDIAC CATHETERIZATION  2011   showed right coronary, totally occluded LAD and severe stenosis in both saphenous vein graft to the posterior lateral and posterior descending  -- stenting of the proximal portion of the SVG to the posterior lateral branch in 2/11  . CHOLECYSTECTOMY    . COLONOSCOPY     x several - colon polyps  . CORONARY ARTERY BYPASS GRAFT  1997    (LIMA to LAD, SVG to PDA, SVG to PL)  . ERCP N/A 10/17/2017    Irene Shipper, MD;  [facility] ENDOSCOPY; choledocholithiasis, biliary pancreatitis.  s/p sphincterotomy and stone extraction.    . ESOPHAGOGASTRODUODENOSCOPY (EGD) WITH PROPOFOL N/A 09/21/2017   Procedure: ESOPHAGOGASTRODUODENOSCOPY (EGD) WITH PROPOFOL;  Surgeon: Doran Stabler, MD;  Location: [facility] ENDOSCOPY;  Service: Gastroenterology;  Laterality: N/A;  . EYE SURGERY     ioc for cataract  . HERNIA REPAIR    . LEFT AND RIGHT HEART CATHETERIZATION WITH CORONARY/GRAFT ANGIOGRAM  09/04/2012   Procedure: LEFT AND RIGHT HEART CATHETERIZATION WITH Beatrix Fetters;  Surgeon: Sherren Mocha, MD;  Location: [facility] CATH LAB;  patent grafts, mod 2ndary pulmonary HTN, elevated LV EDP & PCWP  . LEFT HEART CATH AND CORS/GRAFTS ANGIOGRAPHY N/A 06/17/2017   Procedure: LEFT HEART CATH AND CORS/GRAFTS ANGIOGRAPHY;  Surgeon: Martinique, Peter M, MD;  Location: [facility] CV LAB;  Service: Cardiovascular;  Laterality: N/A;  . mohs     x several  . PARTIAL COLECTOMY     cancerous polyps  . stent to heart  last done 5 to 6 yrs ago   x 4  . UPPER GI ENDOSCOPY         Family History  Problem Relation Age of Onset  . Coronary artery disease Father   . Heart attack Father    . Coronary artery disease Brother        1/2 brother   . Kidney disease Sister   . Cardiomyopathy Mother   . Asthma Brother     Social History   Tobacco Use  . Smoking status: Former Smoker    Packs/day: 1.50    Years: 35.00    Pack years: 52.50    Types: Cigarettes    Quit date: 10/12/1983    Years since quitting: 37.4  . Smokeless tobacco: Never Used  Vaping Use  . Vaping Use: Never used  Substance Use Topics  . Alcohol use: Yes    Alcohol/week: 3.0 - 4.0 standard drinks  Types: 3 - 4 Standard drinks or equivalent per week  . Drug use: No    Home Medications Prior to Admission medications   Medication Sig Start Date End Date Taking? Authorizing Provider  ALPRAZolam Duanne Moron) 0.25 MG tablet Take 1 tablet at bedtime as needed for sleep Patient taking differently: Take 0.25 mg by mouth at bedtime as needed for sleep. 12/15/20  Yes Martinique, Peter M, MD  aspirin 81 MG EC tablet Take 81 mg by mouth at bedtime.   Yes [provider]  atorvastatin (LIPITOR) 20 MG tablet Take 1 tablet (20 mg total) by mouth daily. Patient taking differently: Take 20 mg by mouth at bedtime. 03/02/21 02/25/22 Yes Martinique, Peter M, MD  bismuth subsalicylate (PEPTO BISMOL) 262 MG/15ML suspension Take 30 mLs by mouth every 6 (six) hours as needed for indigestion.   Yes [provider]  carvedilol (COREG) 25 MG tablet Take 1 tablet (25 mg total) by mouth 2 (two) times daily. Patient taking differently: Take 12.5 mg by mouth 2 (two) times daily. 03/02/21 02/25/22 Yes Martinique, Peter M, MD  Cholecalciferol (VITAMIN D) 50 MCG (2000 UT) tablet Take 2,000 Units by mouth every Monday, Wednesday, and Friday.   Yes [provider]  colchicine 0.6 MG tablet Take 0.6-1.2 mg by mouth See admin instructions. Take 2 tablets (1.2 mg) by mouth at onset of gout attack, may take 1 tablet (0.6 mg) one hour later if still needed. Max 3 tablets in 3 days   Yes [provider]  Febuxostat 80 MG TABS  Take 40 mg by mouth every morning. 02/14/20  Yes [provider]  ferrous sulfate 325 (65 FE) MG tablet Take 325 mg by mouth every Monday, Wednesday, and Friday.   Yes [provider]  fluticasone (FLONASE) 50 MCG/ACT nasal spray Place 2 sprays into both nostrils daily as needed for allergies or rhinitis.   Yes [provider]  furosemide (LASIX) 40 MG tablet Take 40 mg by mouth See admin instructions. Take one tablet (40 mg) by mouth daily, may increase to 2 tablets (80 mg) daily as needed for weight gain of 2-3 lbs or leg swelling   Yes [provider]  insulin aspart protamine- aspart (NOVOLOG MIX 70/30) (70-30) 100 UNIT/ML injection Inject 30 Units into the skin 2 (two) times daily with a meal.    Yes [provider]  latanoprost (XALATAN) 0.005 % ophthalmic solution Place 1 drop into both eyes at bedtime.   Yes [provider]  Multiple Vitamin (MULTIVITAMIN WITH MINERALS) TABS tablet Take 1 tablet by mouth daily.   Yes [provider]  nitroGLYCERIN (NITROSTAT) 0.4 MG SL tablet Place 1 tablet (0.4 mg total) under the tongue every 5 (five) minutes as needed for chest pain (up to 3 doses). 02/20/15  Yes Martinique, Peter M, MD  PRESCRIPTION MEDICATION Inhale into the lungs at bedtime. CPAP   Yes [provider]  tamsulosin (FLOMAX) 0.4 MG CAPS capsule Take 0.4 mg by mouth at bedtime.   Yes [provider]  allopurinol (ZYLOPRIM) 100 MG tablet Take 1 tablet (100 mg total) by mouth daily. Patient not taking: No sig reported 01/17/20   Martinique, Peter M, MD  oxyCODONE-acetaminophen (PERCOCET/ROXICET) 5-325 MG tablet Take 1-2 tablets by mouth every 6 (six) hours as needed for severe pain. Patient not taking: No sig reported 10/16/20   Elam Dutch, MD  pantoprazole (PROTONIX) 40 MG tablet Take 1 tablet (40 mg total) by mouth 2 (two) times daily.  Patient taking differently: Take 40 mg by mouth daily before breakfast. 06/09/17    Almyra Deforest, PA    Allergies    Patient has no known allergies.  Review of Systems   Review of Systems  All other systems reviewed and are negative.   Physical Exam Updated Vital Signs BP 126/61   Pulse (!) 59   Temp 97.8 F (36.6 C) (Oral)   Resp 15   Ht 6' (1.829 m)   Wt 93 kg   SpO2 91%   BMI 27.80 kg/m   Physical Exam Vitals and nursing note reviewed.  Constitutional:      General: He is not in acute distress.    Appearance: He is well-developed. He is not ill-appearing, toxic-appearing or diaphoretic.  HENT:     Head: Normocephalic and atraumatic.     Right Ear: External ear normal.     Left Ear: External ear normal.  Eyes:     Conjunctiva/sclera: Conjunctivae normal.     Pupils: Pupils are equal, round, and reactive to light.  Neck:     Trachea: Phonation normal.  Cardiovascular:     Rate and Rhythm: Normal rate and regular rhythm.     Heart sounds: Normal heart sounds.  Pulmonary:     Effort: Pulmonary effort is normal. No respiratory distress.     Breath sounds: Normal breath sounds. No stridor. No wheezing or rhonchi.  Abdominal:     General: Bowel sounds are normal. There is no distension.     Palpations: Abdomen is soft.     Tenderness: There is no abdominal tenderness.  Genitourinary:    Comments: Normal anus, melanotic stool in rectal vault. Musculoskeletal:        General: Normal range of motion.     Cervical back: Normal range of motion and neck supple.     Right lower leg: Edema present.     Left lower leg: Edema present.     Comments: 3+ firm pitting edema lower legs bilaterally.  Skin:    General: Skin is warm and dry.  Neurological:     Mental Status: He is alert and oriented to person, place, and time.     Cranial Nerves: No cranial nerve deficit.     Sensory: No sensory deficit.     Motor: No abnormal muscle tone.     Coordination: Coordination normal.  Psychiatric:        Mood and Affect: Mood normal.        Behavior: Behavior  normal.        Thought Content: Thought content normal.        Judgment: Judgment normal.     ED Results / Procedures / Treatments   Labs (all labs ordered are listed, but only abnormal results are displayed) Labs Reviewed  CBC WITH DIFFERENTIAL/PLATELET - Abnormal; Notable for the following components:      Result Value   RBC 2.05 (*)    Hemoglobin 6.1 (*)    HCT 20.8 (*)    MCV 101.5 (*)    MCHC 29.3 (*)    RDW 17.9 (*)    Platelets 86 (*)    All other components within normal limits  COMPREHENSIVE METABOLIC PANEL - Abnormal; Notable for the following components:   Sodium 134 (*)    Potassium 5.4 (*)    CO2 19 (*)    Glucose, Bld 209 (*)    BUN 157 (*)    Creatinine, Ser 3.06 (*)    Albumin 3.2 (*)  GFR, Estimated 19 (*)    All other components within normal limits  POC OCCULT BLOOD, ED - Abnormal; Notable for the following components:   Fecal Occult Bld POSITIVE (*)    All other components within normal limits  TROPONIN I (HIGH SENSITIVITY) - Abnormal; Notable for the following components:   Troponin I (High Sensitivity) 364 (*)    All other components within normal limits  TROPONIN I (HIGH SENSITIVITY) - Abnormal; Notable for the following components:   Troponin I (High Sensitivity) 331 (*)    All other components within normal limits  RESP PANEL BY RT-PCR (FLU A&B, COVID) ARPGX2  HEMOGLOBIN AND HEMATOCRIT, BLOOD  HEMOGLOBIN AND HEMATOCRIT, BLOOD  IRON AND TIBC  RETICULOCYTES  METHYLMALONIC ACID, SERUM  FOLATE  FERRITIN  MAGNESIUM  COMPREHENSIVE METABOLIC PANEL  CBC  MAGNESIUM  PROTIME-INR  PROTIME-INR  TYPE AND SCREEN  ABO/RH  PREPARE RBC (CROSSMATCH)    EKG EKG Interpretation  Date/Time:  Monday Mar 09 2021 16:18:17 EDT Ventricular Rate:  62 PR Interval:    QRS Duration: 124 QT Interval:  482 QTC Calculation: 489 R Axis:   89 Text Interpretation: Wide QRS rhythm Inferior infarct , age undetermined Anterolateral infarct , age undetermined  Abnormal ECG Since last tracing now with inferior T wave inversion Otherwise no significant change Confirmed by Daleen Bo [phone]) on 03/09/2021 4:56:01 PM   Radiology DG Ribs Unilateral W/Chest Right  Result Date: 03/09/2021 CLINICAL DATA:  Fall EXAM: RIGHT RIBS AND CHEST - 3+ VIEW COMPARISON:  October 15, 2017 FINDINGS: The cardiomediastinal silhouette is unchanged and enlarged in contour.Status post median sternotomy and CABG. Atherosclerotic calcifications. No pleural effusion. No pneumothorax. Similar appearance of coarse bibasilar reticular opacities. Surgical sutures and clips project over the RIGHT abdomen. Multilevel degenerative changes of the thoracic spine. Contour deformity of the RIGHT anterior eighth rib consistent with subacute to chronic prior fracture. No acute displaced rib fracture. IMPRESSION: 1. Subacute to chronic rib fracture of the RIGHT anterior eighth rib. Recommend correlation with point tenderness. 2. Bibasilar coarse reticular opacities likely reflecting underlying pulmonary fibrosis. Electronically Signed   By: Valentino Saxon MD   On: 03/09/2021 17:04    Procedures Procedures   Medications Ordered in ED Medications  0.9 %  sodium chloride infusion (has no administration in time range)  0.9 %  sodium chloride infusion ( Intravenous New Bag/Given 03/09/21 2220)  pantoprazole (PROTONIX) 80 mg in sodium chloride 0.9 % 100 mL (0.8 mg/mL) infusion (8 mg/hr Intravenous New Bag/Given 03/09/21 2222)  ondansetron (ZOFRAN) injection 4 mg (4 mg Intravenous Given 03/09/21 2135)    ED Course  I have reviewed the triage vital signs and the nursing notes.  Pertinent labs & imaging results that were available during my care of the patient were reviewed by me and considered in my medical decision making (see chart for details).  Clinical Course as of 03/09/21 2232  Mon Mar 09, 2021  2112 Dr. Arelia Longest, gastroenterology returned the call, he will see the patient tomorrow. [EW]     Clinical Course User Index [EW] Daleen Bo, MD   MDM Rules/Calculators/A&P                           Patient Vitals for the past 24 hrs:  BP Temp Temp src Pulse Resp SpO2 Height Weight  03/09/21 2227 126/61 97.8 F (36.6 C) Oral (!) 59 15 91 % -- --  03/09/21 2215 126/61 -- -- 60 18  94 % -- --  03/09/21 2200 133/62 -- -- 88 19 100 % -- --  03/09/21 2145 120/72 -- -- (!) 119 (!) 21 100 % -- --  03/09/21 2136 132/63 -- -- 68 (!) 23 100 % -- --  03/09/21 2100 -- -- -- 89 17 94 % -- --  03/09/21 1900 (!) 132/53 -- -- (!) 101 20 100 % -- --  03/09/21 1815 (!) 122/58 -- -- (!) 59 16 99 % -- --  03/09/21 1800 (!) 126/54 -- -- 60 15 100 % -- --  03/09/21 1745 (!) 102/42 -- -- 61 20 100 % -- --  03/09/21 1730 126/64 -- -- (!) 103 13 94 % -- --  03/09/21 1715 125/63 -- -- (!) 59 17 100 % -- --  03/09/21 1627 -- -- -- -- -- -- 6' (1.829 m) 93 kg  03/09/21 1616 (!) 130/54 (!) 97.4 F (36.3 C) -- 61 18 100 % -- --    9:07 PM Reevaluation with update and discussion. After initial assessment and treatment, an updated evaluation reveals patient now states that he has had dark and some bloody looking stool for 2 days.  Findings discussed with brother in the room, all questions answered. Daleen Bo   Medical Decision Making:  This patient is presenting for evaluation of shortness of breath, which does require a range of treatment options, and is a complaint that involves a high risk of morbidity and mortality. The differential diagnoses include pneumonia, CHF, PE, metabolic disorder. I decided to review old records, and in summary elderly male, being followed for rising creatinine, with new shortness of breath associated with dyspnea exertion over the last several days..  I did not require additional historical information from anyone.  Clinical Laboratory Tests Ordered, included CBC, Metabolic panel and Fecal occult blood, viral panel. Review indicates normal except sodium low,  potassium high, CO2 low, glucose high, BUN high, creatinine high, albumin low, GFR low, stool guaiac positive, hemoglobin low, MCV high, platelets low. Radiologic Tests Ordered, included chest and rib x-rays,  I independently Visualized: Radiograph images, which show possible subacute right rib #8 fracture.     Critical Interventions-clinical evaluation, laboratory testing, radiography, observation reassess  After These Interventions, the Patient was reevaluated and was found with significant anemia, likely from GI bleeding.  He reports he had a colonoscopy 10 years ago that was normal.  Suspect blood loss leading to anemia, causing shortness of breath.  Troponin elevated with abnormal EKG.  Suspect demand ischemia.  He is not having chest pain.  There is been no palpitations or syncope.  He will require blood transfusion for symptomatic anemia, GI consultation and medical admission  CRITICAL CARE-yes Performed by: Daleen Bo  Nursing Notes Reviewed/ Care Coordinated Applicable Imaging Reviewed Interpretation of Laboratory Data incorporated into ED treatment   9:18 PM-Consult complete with hospital. Patient case explained and discussed.  He agrees to admit patient for further evaluation and treatment. Call ended at 9:29 PM    Final Clinical Impression(s) / ED Diagnoses Final diagnoses:  Gastrointestinal hemorrhage, unspecified gastrointestinal hemorrhage type  Anemia, unspecified type  Elevated troponin  Abnormal EKG    Rx / DC Orders ED Discharge Orders    None       Daleen Bo, MD 03/09/21 2232

## 2021-03-09 NOTE — ED Notes (Signed)
Pt's brother, Daryan Buell, can be reached at [phone]

## 2021-03-09 NOTE — ED Triage Notes (Signed)
Pt c/o SHOB, large bruise noted on R ribcage/RUQ abdomen. States he fell into corner of counter approx 1mo ago, bruising has not gotten better. Pt has had 2 small toes amputated from R foot in December, states balance has been "off" since. Pt pale, SHOB in triage.

## 2021-03-09 NOTE — ED Provider Notes (Signed)
Emergency Medicine Provider Triage Evaluation Note  Scott [name] , a 87 y.o. male  was evaluated in triage.  Pt complains of sob, dizziness that started 3 days ago. Denies chest pain. States that 2-3 weeks ago he hit his abd/lower and it is now bruised.  Review of Systems  Positive: Sob, weak Negative: Chest pain  Physical Exam  BP (!) 130/54 (BP Location: Right Arm)   Pulse 61   Temp (!) 97.4 F (36.3 C)   Resp 18   SpO2 100%  Gen:   Awake, no distress   Resp:  Normal effort  MSK:   Moves extremities without difficulty  Other:  Bibasilar rales, large hematoma to the abdomen and right chest, pale conjunctiva  Medical Decision Making  Medically screening exam initiated at 4:17 PM.  Appropriate orders placed.  Scott [name] was informed that the remainder of the evaluation will be completed by another provider, this initial triage assessment does not replace that evaluation, and the importance of remaining in the ED until their evaluation is complete.     Scott Booze, PA-C 03/09/21 1623    Tegeler, Gwenyth Allegra, MD 03/09/21 [id number]

## 2021-03-09 NOTE — H&P (Signed)
History and Physical    PLEASE NOTE THAT DRAGON DICTATION SOFTWARE WAS USED IN THE CONSTRUCTION OF THIS NOTE.   Scott [name] VHQ:[id number] DOB: 1933-07-31 DOA: 03/09/2021  PCP: Reubin Milan, MD Patient coming from: home   I have personally briefly reviewed patient's old medical records in [place]  Chief Complaint: Shortness of breath  HPI: Scott [name] is a 87 y.o. male with medical history significant for chronic thrombocytopenia, type 2 diabetes mellitus, hypertension, hyperlipidemia, stage IIIb chronic kidney disease with baseline creatinine 2.2-2.6, chronic combined systolic/diastolic heart failure, chronic iron deficiency anemia with baseline hemoglobin 10-12, who is admitted to [facility] on 03/09/2021 with acute upper gastrointestinal bleed after presenting from home to [facility] ED complaining of shortness of breath.   The patient reports its 2 days of shortness of breath in the absence of any associated chest pain, palpitations, diaphoresis, nausea, vomiting, dizziness, presyncope, or syncope.  He also denies any associated orthopnea, PND, or worsening of peripheral edema.  Not associated with any recent subjective fever, chills, rigors, or generalized myalgias.  Not associate with any recent cough, wheezing, hemoptysis, new lower extremity erythema, or calf tenderness.  No recent trauma or travel.  Not associated with any recent headache, neck stiffness, rhinitis, rhinorrhea, sore throat, or rash.   He does however report that he has been experiencing new onset dark-colored stool over the last 3 days, reporting 1 such episode of dark-colored stool on each of these last 3 days, with most recent episode occurring earlier today in the absence of any associated hematochezia.  He denies any associated abdominal discomfort nausea, vomiting, or hematemesis.  Denies any prior history of acute gastrointestinal bleed, and believes that his most recent screening colonoscopy occurred  approximately 10 years ago and conveys that it is is his recollection that this endoscopic evaluation was not associate with any significant acute findings.  He denies any routine alcohol consumption, and states that he has not consumed any alcohol since December 2021.  Denies any known history of underlying chronic liver disease.  Reports that he is on a daily baby aspirin, with most recent dose occurring this morning, but otherwise denies any use of blood thinners as an outpatient.  No recent use of NSAIDs.  He knowledges a history of GERD for which she reports good compliance on his home scheduled Protonix.  Medical history notable for chronic iron deficiency anemia with baseline hemoglobin 10-12.  The patient confirms use of oral iron supplementation as an outpatient, but reports that he has been using oral iron for at least the last several months without noting any dark-colored stool until 3 days ago, in the absence of any recent increase in dose of this iron supplement.  Medical history is also notable for chronic combined systolic/diastolic heart failure, with most recent echocardiogram performed in April 2021 showing LVEF 35%, global hypokinesis, severely dilated left ventricle, indeterminate diastolic parameters, and mild to moderate mitral regurgitation.  He is on Lasix 20 mg p.o. daily at home, as well as Coreg.     ED Course:  Vital signs in the ED were notable for the following: Tetramex 97.8, heart rate 60-88, blood pressure 102/42 -132/53; respiratory rate 17-23; oxygen saturation 94 to 100% on room air.  Labs were notable for the following: CMP was notable for the following: Sodium 134, which corrects to greater than 135 when taking into account concomitant hyperglycemia, potassium 5.4, bicarbonate 19, anion gap 12, BUN 157 relative to most recent prior value  85 on 10/06/2020, creatinine 3.06 relative to 2.5 on 10/06/2020, glucose 209, liver enzymes were found to be within normal  limits.  CBC notable for white blood cell count of 7600, hemoglobin 6.1 and associated with MCV 101.5, hypochromic findings, and an elevated RDW, which is relative to most recent prior hemoglobin value of 12.2 in December 2021, platelets 86.  Fecal occult blood was found to be positive in the ED.  High-sensitivity troponin I noted to be 364, with repeat value trending down to 331.  Nasopharyngeal COVID-19/influenza PCR performed in the ED this evening and found to be negative.  EKG, by way of comparison to most recent prior from 03/02/2021, shows a low voltage study, but appears to show sinus rhythm with first-degree AV block and heart rate 62, with no interval development of T wave or ST changes relative to this most recent prior EKG. chest x-ray shows no evidence of acute cardiopulmonary process, including no evidence of edema, effusion, or pneumothorax, while showing evidence of subacute to chronic right anterior eighth rib fracture.  On-call gastroenterologist, Dr. Carlean Purl, who works with [facility], was consulted, with additional recommendations to follow.   The patient was typed and screened in the ED, with ensuing order for transfusion of 2 units PRBC.     Review of Systems: As per HPI otherwise 10 point review of systems negative.   Past Medical History:  Diagnosis Date  . Acute biliary pancreatitis 10/2017   with choledocholithiasis.  ERCP, stone extraction performed  . Arthritis   . CAD (coronary artery disease)    a. 1997 CABGx3 (VG->RPDA, VG->PLV, LIMA->LAD);  b. 07/2004 PCI VG->PLV (3.5x16 Taxus DES, 3.5x12 Taxus DES); c. 11/2009 PCI: VG->PLV 3.5x12 Promus DES), VG->RPDA (3.5x15 Promus DES); d. 08/2012 Cath: patent grafts; e. 05/2013 Lexiscan MV: no ischemia/infarction, EF 44%.  . Cancer [facility]) 2005   colon surgery done  . Cataract   . Chronic combined systolic and diastolic CHF (congestive heart failure) ([place]) 08/2012   a. EF 50-55% 11/2011; b. 08/2012 - EF 25-30%,  with mild RM, mod  TR, mod RAE & LAE, moderate reduced RV systolic function and PASP of 86mmHg. - > cath showed patent grafts & Mod Puml HTN wth elevated PCWP;  c. 01/2014 Echo: EF 35-40%, mod LVH, mod HK, mildly dil LA, mod dil RA, PASP 38mmHg.  . CKD (chronic kidney disease), stage III ([place])   . Dyslipidemia   . GERD (gastroesophageal reflux disease)   . Gout   . History of home oxygen therapy    uses oxygen nightly with cpap   . Hyperkalemia   . Hypertension   . Insulin Dependent Diabetes mellitus    type 2  . Myocardial infarction ([place]) 1997  . PAD (peripheral artery disease) ([place])   . Sleep apnea   . Thrombocytopenia ([place]) 2011   dates back to 2011  . UTI (lower urinary tract infection) 08/2012   E.coli    Past Surgical History:  Procedure Laterality Date  . ABDOMINAL AORTOGRAM W/LOWER EXTREMITY Right 01/04/2020   Abdominal aortogram with right lower extremity runoff  . ABDOMINAL AORTOGRAM W/LOWER EXTREMITY Bilateral 01/04/2020   Procedure: ABDOMINAL AORTOGRAM W/LOWER EXTREMITY;  Surgeon: Elam Dutch, MD;  Location: [place] CV LAB;  Service: Vascular;  Laterality: Bilateral;  . AMPUTATION Right 02/25/2020   Procedure: RIGHT FIFTH TOE AMPUTATION;  Surgeon: Elam Dutch, MD;  Location: [place];  Service: Vascular;  Laterality: Right;  . AMPUTATION Right 10/06/2020   Procedure: RIGHT FOURTH TOE  AMPUTATION WITH RESECTION OF METATARSAL HEAD;  Surgeon: Elam Dutch, MD;  Location: [place];  Service: Vascular;  Laterality: Right;  . APPENDECTOMY    . BACK SURGERY     15 years ago lower  . CARDIAC CATHETERIZATION  2005   stenting of the vein graft to the posterior lateral per -- Dr. Martinique      . CARDIAC CATHETERIZATION  2011   showed right coronary, totally occluded LAD and severe stenosis in both saphenous vein graft to the posterior lateral and posterior descending  -- stenting of the proximal portion of the SVG to the posterior lateral branch in 2/11  . CHOLECYSTECTOMY    .  COLONOSCOPY     x several - colon polyps  . CORONARY ARTERY BYPASS GRAFT  1997    (LIMA to LAD, SVG to PDA, SVG to PL)  . ERCP N/A 10/17/2017    Irene Shipper, MD;  [facility] ENDOSCOPY; choledocholithiasis, biliary pancreatitis.  s/p sphincterotomy and stone extraction.    . ESOPHAGOGASTRODUODENOSCOPY (EGD) WITH PROPOFOL N/A 09/21/2017   Procedure: ESOPHAGOGASTRODUODENOSCOPY (EGD) WITH PROPOFOL;  Surgeon: Doran Stabler, MD;  Location: [facility] ENDOSCOPY;  Service: Gastroenterology;  Laterality: N/A;  . EYE SURGERY     ioc for cataract  . HERNIA REPAIR    . LEFT AND RIGHT HEART CATHETERIZATION WITH CORONARY/GRAFT ANGIOGRAM  09/04/2012   Procedure: LEFT AND RIGHT HEART CATHETERIZATION WITH Beatrix Fetters;  Surgeon: Sherren Mocha, MD;  Location: [facility] CATH LAB;  patent grafts, mod 2ndary pulmonary HTN, elevated LV EDP & PCWP  . LEFT HEART CATH AND CORS/GRAFTS ANGIOGRAPHY N/A 06/17/2017   Procedure: LEFT HEART CATH AND CORS/GRAFTS ANGIOGRAPHY;  Surgeon: Martinique, Peter M, MD;  Location: [place] CV LAB;  Service: Cardiovascular;  Laterality: N/A;  . mohs     x several  . PARTIAL COLECTOMY     cancerous polyps  . stent to heart  last done 5 to 6 yrs ago   x 4  . UPPER GI ENDOSCOPY      Social History:  reports that he quit smoking about 37 years ago. His smoking use included cigarettes. He has a 52.50 pack-year smoking history. He has never used smokeless tobacco. He reports current alcohol use of about 3.0 - 4.0 standard drinks of alcohol per week. He reports that he does not use drugs.   No Known Allergies  Family History  Problem Relation Age of Onset  . Coronary artery disease Father   . Heart attack Father   . Coronary artery disease Brother        1/2 brother   . Kidney disease Sister   . Cardiomyopathy Mother   . Asthma Brother     Family history reviewed and not pertinent    Prior to Admission medications   Medication Sig Start Date End Date Taking? Authorizing Provider   ALPRAZolam Duanne Moron) 0.25 MG tablet Take 1 tablet at bedtime as needed for sleep Patient taking differently: Take 0.25 mg by mouth at bedtime as needed for sleep. 12/15/20  Yes Martinique, Peter M, MD  aspirin 81 MG EC tablet Take 81 mg by mouth at bedtime.   Yes [provider]  atorvastatin (LIPITOR) 20 MG tablet Take 1 tablet (20 mg total) by mouth daily. Patient taking differently: Take 20 mg by mouth at bedtime. 03/02/21 02/25/22 Yes Martinique, Peter M, MD  bismuth subsalicylate (PEPTO BISMOL) 262 MG/15ML suspension Take 30 mLs by mouth every 6 (six) hours as needed for indigestion.  Yes [provider]  carvedilol (COREG) 25 MG tablet Take 1 tablet (25 mg total) by mouth 2 (two) times daily. Patient taking differently: Take 12.5 mg by mouth 2 (two) times daily. 03/02/21 02/25/22 Yes Martinique, Peter M, MD  Cholecalciferol (VITAMIN D) 50 MCG (2000 UT) tablet Take 2,000 Units by mouth every Monday, Wednesday, and Friday.   Yes [provider]  colchicine 0.6 MG tablet Take 0.6-1.2 mg by mouth See admin instructions. Take 2 tablets (1.2 mg) by mouth at onset of gout attack, may take 1 tablet (0.6 mg) one hour later if still needed. Max 3 tablets in 3 days   Yes [provider]  Febuxostat 80 MG TABS Take 40 mg by mouth every morning. 02/14/20  Yes [provider]  ferrous sulfate 325 (65 FE) MG tablet Take 325 mg by mouth every Monday, Wednesday, and Friday.   Yes [provider]  fluticasone (FLONASE) 50 MCG/ACT nasal spray Place 2 sprays into both nostrils daily as needed for allergies or rhinitis.   Yes [provider]  furosemide (LASIX) 40 MG tablet Take 40 mg by mouth See admin instructions. Take one tablet (40 mg) by mouth daily, may increase to 2 tablets (80 mg) daily as needed for weight gain of 2-3 lbs or leg swelling   Yes [provider]  insulin aspart protamine- aspart (NOVOLOG MIX 70/30) (70-30) 100 UNIT/ML injection Inject 30  Units into the skin 2 (two) times daily with a meal.    Yes [provider]  latanoprost (XALATAN) 0.005 % ophthalmic solution Place 1 drop into both eyes at bedtime.   Yes [provider]  Multiple Vitamin (MULTIVITAMIN WITH MINERALS) TABS tablet Take 1 tablet by mouth daily.   Yes [provider]  nitroGLYCERIN (NITROSTAT) 0.4 MG SL tablet Place 1 tablet (0.4 mg total) under the tongue every 5 (five) minutes as needed for chest pain (up to 3 doses). 02/20/15  Yes Martinique, Peter M, MD  PRESCRIPTION MEDICATION Inhale into the lungs at bedtime. CPAP   Yes [provider]  tamsulosin (FLOMAX) 0.4 MG CAPS capsule Take 0.4 mg by mouth at bedtime.   Yes [provider]  allopurinol (ZYLOPRIM) 100 MG tablet Take 1 tablet (100 mg total) by mouth daily. Patient not taking: No sig reported 01/17/20   Martinique, Peter M, MD  oxyCODONE-acetaminophen (PERCOCET/ROXICET) 5-325 MG tablet Take 1-2 tablets by mouth every 6 (six) hours as needed for severe pain. Patient not taking: No sig reported 10/16/20   Elam Dutch, MD  pantoprazole (PROTONIX) 40 MG tablet Take 1 tablet (40 mg total) by mouth 2 (two) times daily. Patient taking differently: Take 40 mg by mouth daily before breakfast. 06/09/17   Almyra Deforest, [place]     Objective    Physical Exam: Vitals:   03/09/21 1800 03/09/21 1815 03/09/21 1900 03/09/21 2100  BP: (!) 126/54 (!) 122/58 (!) 132/53   Pulse: 60 (!) 59 (!) 101 89  Resp: 15 16 20 17   Temp:      SpO2: 100% 99% 100% 94%  Weight:      Height:        General: appears to be stated age; alert, oriented Skin: warm, dry, no rash Head:  AT/[place] Mouth:  Oral mucosa membranes appear dry, normal dentition Neck: supple; trachea midline Heart:  RRR; did not appreciate any M/R/G Lungs: CTAB, did not appreciate any wheezes, rales, or rhonchi Abdomen: + BS; soft, ND, NT Vascular: 2+ pedal pulses  b/l; 2+ radial pulses b/l Extremities: no peripheral edema, no  muscle wasting Neuro: strength and sensation intact in upper and lower extremities b/l    Labs on Admission: I have personally reviewed following labs and imaging studies  CBC: Recent Labs  Lab 03/09/21 1621  WBC 7.6  NEUTROABS 5.3  HGB 6.1*  HCT 20.8*  MCV 101.5*  PLT 86*   Basic Metabolic Panel: Recent Labs  Lab 03/09/21 1621  NA 134*  K 5.4*  CL 103  CO2 19*  GLUCOSE 209*  BUN 157*  CREATININE 3.06*  CALCIUM 8.9   GFR: Estimated Creatinine Clearance: 19 mL/min (A) (by C-G formula based on SCr of 3.06 mg/dL (H)). Liver Function Tests: Recent Labs  Lab 03/09/21 1621  AST 20  ALT 17  ALKPHOS 73  BILITOT 1.1  PROT 6.9  ALBUMIN 3.2*   No results for input(s): LIPASE, AMYLASE in the last 168 hours. No results for input(s): AMMONIA in the last 168 hours. Coagulation Profile: No results for input(s): INR, PROTIME in the last 168 hours. Cardiac Enzymes: No results for input(s): CKTOTAL, CKMB, CKMBINDEX, TROPONINI in the last 168 hours. BNP (last 3 results) No results for input(s): PROBNP in the last 8760 hours. HbA1C: No results for input(s): HGBA1C in the last 72 hours. CBG: No results for input(s): GLUCAP in the last 168 hours. Lipid Profile: No results for input(s): CHOL, HDL, LDLCALC, TRIG, CHOLHDL, LDLDIRECT in the last 72 hours. Thyroid Function Tests: No results for input(s): TSH, T4TOTAL, FREET4, T3FREE, THYROIDAB in the last 72 hours. Anemia Panel: No results for input(s): VITAMINB12, FOLATE, FERRITIN, TIBC, IRON, RETICCTPCT in the last 72 hours. Urine analysis:    Component Value Date/Time   COLORURINE YELLOW 05/08/2020 1036   APPEARANCEUR CLEAR 05/08/2020 1036   LABSPEC 1.008 05/08/2020 1036   PHURINE 5.0 05/08/2020 1036   GLUCOSEU NEGATIVE 05/08/2020 1036   HGBUR NEGATIVE 05/08/2020 1036   BILIRUBINUR NEGATIVE 05/08/2020 1036   KETONESUR NEGATIVE 05/08/2020 1036   PROTEINUR NEGATIVE 05/08/2020 1036   UROBILINOGEN 1.0 08/31/2012 1735    NITRITE NEGATIVE 05/08/2020 1036   LEUKOCYTESUR NEGATIVE 05/08/2020 1036    Radiological Exams on Admission: DG Ribs Unilateral W/Chest Right  Result Date: 03/09/2021 CLINICAL DATA:  Fall EXAM: RIGHT RIBS AND CHEST - 3+ VIEW COMPARISON:  October 15, 2017 FINDINGS: The cardiomediastinal silhouette is unchanged and enlarged in contour.Status post median sternotomy and CABG. Atherosclerotic calcifications. No pleural effusion. No pneumothorax. Similar appearance of coarse bibasilar reticular opacities. Surgical sutures and clips project over the RIGHT abdomen. Multilevel degenerative changes of the thoracic spine. Contour deformity of the RIGHT anterior eighth rib consistent with subacute to chronic prior fracture. No acute displaced rib fracture. IMPRESSION: 1. Subacute to chronic rib fracture of the RIGHT anterior eighth rib. Recommend correlation with point tenderness. 2. Bibasilar coarse reticular opacities likely reflecting underlying pulmonary fibrosis. Electronically Signed   By: Valentino Saxon MD   On: 03/09/2021 17:04     EKG: Independently reviewed, with result as described above.    Assessment/Plan   KRISTOFFER BALA is a 87 y.o. male with medical history significant for chronic thrombocytopenia, type 2 diabetes mellitus, hypertension, hyperlipidemia, stage IIIb chronic kidney disease with baseline creatinine 2.2-2.6, chronic combined systolic/diastolic heart failure, chronic iron deficiency anemia with baseline hemoglobin 10-12, who is admitted to [facility] on 03/09/2021 with acute upper gastrointestinal bleed after presenting from home to [facility] ED complaining of shortness of breath.    Principal Problem:   Acute  upper GI bleed Active Problems:   Hypertension   Dyslipidemia   Diabetes mellitus, type II, insulin dependent (HCC)   OSA (obstructive sleep apnea)   Chronic combined systolic and diastolic heart failure (HCC)   Acute on chronic blood loss anemia   Acute kidney  injury superimposed on CKD (HCC)   Elevated troponin   Shortness of breath      #) Acute Upper GI Bleed: diagnosis on the basis of 3 days of melena, with interval elevation in BUN, as further quantified, with fecal occult blood found to be positive emergency department this evening.  On a daily baby aspirin as an outpatient, but otherwise on no additional blood thinners at home.  Denies any recent NSAID use.  No known history of underlying liver disease, denies any recent alcohol consumption, specifically noting last alcohol was consumed in December 2021.  Has a history of GERD, and acknowledges good compliance with outpatient Protonix.  In the absence of known liver disease, initiation of SBP prophylaxis does not appear to be warranted at this time.  Presentation and history are also suggestive of variceal bleed, and therefore there does not appear to be an indication for octreotide. At this time ddx broadly includes, but is not limited to gastritis vs erosive esophagitis vs PUD (gastric versus duodenal distribution) vs Dieulafoy lesion vs AVM.   At this time, the patient appears hemodynamically stable, with normotensive blood pressures in the absence of any associated tachycardia throughout his ED course. Presentation appears to be associated with acute blood loss anemia, as further described below, with presenting hemoglobin noted to be 6.1 relative to baseline range of 10-12.  Suspect that his presenting shortness of breath is as a consequence of his associated presenting acute on chronic iron deficiency anemia.  Otherwise, the patient has been asymptomatic.  Of note, patient was typed and screened in the ED today, with order for transfusion of 2 units PBC.  In the context of a documented history of chronic systolic/diastolic heart failure with LVEF 35%, will transfuse these units slowly, with each unit transfused for at least 3 hours.  Additionally, I have ordered Lasix 20 mg IV x1 to be administered  after completion of transfusion of the first unit.      Plan: NPO. Refraining from pharmacologic DVT prophylaxis. Monitor on telemetry. Monitor continuous pulse-ox. Maintain at least 2 large bore IV's. Check INR in the AM. Q4H H&H's have been ordered through 9 AM on 03/10/2021. Will closely monitor these ensuing Hgb levels and correlate these data points with the patient's overall clinical picture including vital signs to determine need for subsequent transfusion beyond the currently ordered 2 units PRBC.  Protonix drip.  Hold home aspirin for now.  Add on iron studies to pretransfusion labs, I am also check folic acid/MMA given that presenting hemoglobin is associated with microcytic findings.  Dr. Carlean Purl of GI consulted , with additional recommendations to follow.  Recheck BMP in the morning.  Check INR.Hold home beta-blocker for now.       #) Acute on chronic blood loss anemia: in the setting of a history of chronic iron deficiency anemia with baseline hemoglobin range of 10-12, and most recent prior hemoglobin noted to be 12.12 in December 2021, presenting hemoglobin, in setting of acute upper GI bleed, is noted to be 6.1 and associated with macrocytic hypochromic findings as well as an elevated RDW, with the latter 2 components consistent with iron deficiency anemia.  We will add on iron studies to  pretransfusion labs as well as check MMA and folic acid level given the macrocytic finding.  Of note, at this time, the patient appears hemodynamically stable and asymptomatic aside from the mild shortness of breath that he has been experiencing over the last 2 days.  He is in the process of receiving transfusion of 2 units of PRBC, as described above, with ensuing close monitoring of H&H levels has indicated below.  Of note, the setting of patient's chronic iron deficiency anemia, he reports that he has been on oral iron supplementation as an outpatient.   Plan: work-up and management for presenting  suspected acute upper GI bleed, as above, including close monitoring of Q4H H&H's, as described above.  Transfusion of 2 units PRBC for 3 hours each in the setting of documented history of chronic combined heart failure, with Lasix 20 mg IV x1 ordered to be administered after completion of transfusion of the first unit PRBC.  Monitor on telemetry.  Monitor continuous pulse oximetry.  NPO.  Refraining from pharmacologic DVT prophylaxis.  Check INR. Add on the following to initial lab specimen collected in the ED today: total iron, TIBC, ferritin, MMA, folic acid level, reticulocyte count. Gastroenterology consulted, as above.  Hold home aspirin.     #) Acute kidney injury superimposed on stage IIIb chronic kidney disease: Relative to baseline creatinine range of 2.2-2.6, presenting creatinine noted to be 3.06.  Suspect that this is prerenal in nature, as a consequence of diminished renal perfusion in the setting of decline in oxygen carrying/delivery capacity in the context of presenting acute on chronic anemia in the setting of presenting acute upper gastrointestinal bleed, as further described above.  Plan: Check urinalysis with microscopy to evaluate for the presence of any urinary casts.  Add on random urine sodium as well as random urine creatinine.  Monitor strict I's and O's and daily weights.  Tempt avoid nephrotoxic agents.  Further evaluation management of acute on chronic blood loss anemia, including PRBC transfusion, as outlined above.  Repeat BMP in the morning.      #) Elevated troponin: elevated initial troponin of 364, with subsequent value trending down to 331.  Suspect that this troponin elevation is on the basis of supply demand mismatch in the setting of diminished oxygen delivery capacity as a consequence of presenting acute on chronic iron deficiency anemia as opposed to representing a type I process due to acute plaque rupture.  Additionally, relative decline in renal clearance of  troponin in the setting of acute kidney injury superimposed on stage IIIb chronic kidney disease is likely also contributing to presenting troponin elevation.  EKG, by way of comparison to most recent prior from 03/02/2021 shows no evidence of interval T wave or ST changes, including no ST elevation, while chest x-ray shows no evidence of acute cardiopulmonary process.  Initially on presentation is not associate with any recent chest pain.  Overall, ACS is felt to be less likely relative to type II supply demand mismatch, as above, but will closely monitor on telemetry overnight while treating suspected underlying diminished oxygen carrying capacity in the setting of acute on chronic iron deficiency anemia, as further described above.  Given that acute on chronic anemia appears to be the primary pathology leading to this supply demand mismatch scenario, will prioritize evaluation and management of this process, with consideration for obtaining echocardiogram after endoscopic evaluation in order to evaluate for any interval focal wall motion normalities.  Given suspected type II supply demand mismatch and in  the context of presenting acute upper gastrointestinal bleed, will not initiate heparin drip at this time.    Plan: Monitor on telemetry. PRN EKG for development of chest pain. Check serum Mg level and repeat BMP in the morning, with prn supplementation to maintain Mg and potassium levels greater than or equal to 2.0 and 4.0, respectively, to further reduce risk of ventricular arrhythmia. Additional evaluation and management of presenting acute on chronic iron deficiency anemia due to acute upper gastrointestinal bleed as suspected driving force behind mildly elevated troponin, as above.  Consider obtaining echocardiogram later during this hospitalization, as further described above.      #) Hyperkalemia: Mildly elevated initial potassium of 5.4.  This appears to be in the setting multifactorial  processes, including acute kidney injury superimposed on stage IIIb chronic kidney disease, as well as dehydration from intravascular depletion as a result of acute on chronic blood loss anemia, with additional pharmacologic contribution from nonselective beta-blocker as an outpatient, noting that Coreg can increase serum potassium level by up to 0.3.  We will further evaluate acute on chronic blood loss anemia in the setting of acute upper gastrointestinal bleed, as described above, while closely monitoring on telemetry and repeating BMP in the morning  Plan: Monitor on telemetry.  Add on serum magnesium level.  Repeat BMP in the morning.  Work-up and management of acute on chronic blood loss anemia in the setting of acute upper GI bleed, as above.  Hold home Coreg.      #) Type 2 diabetes mellitus: On 70/30 insulin at a dose of 30 units subcu twice daily as an outpatient.  Presenting blood sugar noted to be 209.  We will continue a portion of the basal element of his 70/30 insulin for now, can plan for n.p.o. status in the setting of acute upper gastrointestinal bleed, as further quantified below.  Plan: Hold home 70/30 insulin for now.  Levemir 40 units subcu twice daily.  Accu-Cheks before every meal and at bedtime with low-dose sliding scale insulin.      #) Essential hypertension: Outpatient antihypertensive regimen consists of Coreg, Lasix, with additional contribution from Flomax.  Patient has been normotensive in the ED.  In the setting of presenting acute upper gastrointestinal bleed, will hold these home antihypertensive medications for now.  Plan: Hold home Coreg, Lasix, and Flomax for now.  Close monitoring of ensuing blood pressure  via routine vital signs.        #) Chronic combined systolic and diastolic heart failure: documented history of such, with most recent echocardiogram performed in April 2021, with results as further detailed above. No clinical or radiographic  evidence to suggest acutely decompensated heart failure at this time. Patient's home diuretic regimen reportedly consists of the following: Lasix 20 mg p.o. daily. Home cardiac medications also include the following: Coreg.  Not on an ACE inhibitor or ARB in the setting of stage IIIb chronic kidney disease.    Plan: monitor strict I's & O's and daily weights. Repeat BMP in the morning. Check serum magnesium level.  Home Lasix for now.  Failure, I have ordered Lasix 20 mg IV x1 to be administered after completion of transfusion of first unit PRBC, as further detailed above.  Additionally, in the setting of acute upper GI bleed, will hold home beta-blocker for now.      #) Hyperlipidemia: On atorvastatin as an outpatient.  Plan: In the setting of current n.p.o. status, will hold home statin for now.  DVT prophylaxis: scd's  Code Status: Per my discussions with the patient this evening, he clearly conveys that his wish to be DNR/DNI Family Communication: none Disposition Plan: Per Rounding Team Consults called: on-call gastroenterologist, Dr. Carlean Purl, consulted, as further described above. Admission status: Observation; PCU.     Of note, this patient was added by me to the following Admit List/Treatment Team: [email or web address].      PLEASE NOTE THAT DRAGON DICTATION SOFTWARE WAS USED IN THE CONSTRUCTION OF THIS NOTE.   [place] [facility] Pager [phone] From [place]  Otherwise, please contact night-coverage  [email or web address] Password [facility]   03/09/2021, 9:31 PM

## 2021-03-09 NOTE — ED Notes (Signed)
This RN assisted patient with wheelchair to restroom.

## 2021-03-10 ENCOUNTER — Inpatient Hospital Stay (HOSPITAL_COMMUNITY): Payer: Medicare HMO | Admitting: Certified Registered Nurse Anesthetist

## 2021-03-10 ENCOUNTER — Encounter (HOSPITAL_COMMUNITY): Payer: Self-pay | Admitting: Internal Medicine

## 2021-03-10 ENCOUNTER — Encounter (HOSPITAL_COMMUNITY)
Admission: EM | Disposition: A | Discharge status: Skilled Nursing Facility | Payer: Self-pay | Source: Home / Self Care | Attending: Internal Medicine

## 2021-03-10 DIAGNOSIS — I272 Pulmonary hypertension, unspecified: Secondary | ICD-10-CM | POA: Diagnosis not present

## 2021-03-10 DIAGNOSIS — D62 Acute posthemorrhagic anemia: Secondary | ICD-10-CM | POA: Diagnosis present

## 2021-03-10 DIAGNOSIS — R0602 Shortness of breath: Secondary | ICD-10-CM | POA: Diagnosis present

## 2021-03-10 DIAGNOSIS — N184 Chronic kidney disease, stage 4 (severe): Secondary | ICD-10-CM | POA: Diagnosis not present

## 2021-03-10 DIAGNOSIS — N179 Acute kidney failure, unspecified: Secondary | ICD-10-CM | POA: Diagnosis present

## 2021-03-10 DIAGNOSIS — K259 Gastric ulcer, unspecified as acute or chronic, without hemorrhage or perforation: Secondary | ICD-10-CM

## 2021-03-10 DIAGNOSIS — E119 Type 2 diabetes mellitus without complications: Secondary | ICD-10-CM | POA: Diagnosis not present

## 2021-03-10 DIAGNOSIS — D631 Anemia in chronic kidney disease: Secondary | ICD-10-CM | POA: Diagnosis not present

## 2021-03-10 DIAGNOSIS — I2510 Atherosclerotic heart disease of native coronary artery without angina pectoris: Secondary | ICD-10-CM | POA: Diagnosis present

## 2021-03-10 DIAGNOSIS — K3189 Other diseases of stomach and duodenum: Secondary | ICD-10-CM | POA: Diagnosis not present

## 2021-03-10 DIAGNOSIS — D649 Anemia, unspecified: Secondary | ICD-10-CM | POA: Diagnosis not present

## 2021-03-10 DIAGNOSIS — R778 Other specified abnormalities of plasma proteins: Secondary | ICD-10-CM | POA: Diagnosis present

## 2021-03-10 DIAGNOSIS — D509 Iron deficiency anemia, unspecified: Secondary | ICD-10-CM | POA: Diagnosis present

## 2021-03-10 DIAGNOSIS — E1151 Type 2 diabetes mellitus with diabetic peripheral angiopathy without gangrene: Secondary | ICD-10-CM | POA: Diagnosis present

## 2021-03-10 DIAGNOSIS — Z66 Do not resuscitate: Secondary | ICD-10-CM | POA: Diagnosis not present

## 2021-03-10 DIAGNOSIS — N189 Chronic kidney disease, unspecified: Secondary | ICD-10-CM | POA: Diagnosis not present

## 2021-03-10 DIAGNOSIS — I5042 Chronic combined systolic (congestive) and diastolic (congestive) heart failure: Secondary | ICD-10-CM | POA: Diagnosis not present

## 2021-03-10 DIAGNOSIS — K254 Chronic or unspecified gastric ulcer with hemorrhage: Secondary | ICD-10-CM | POA: Diagnosis not present

## 2021-03-10 DIAGNOSIS — R7989 Other specified abnormal findings of blood chemistry: Secondary | ICD-10-CM | POA: Diagnosis present

## 2021-03-10 DIAGNOSIS — C4490 Unspecified malignant neoplasm of skin, unspecified: Secondary | ICD-10-CM | POA: Diagnosis present

## 2021-03-10 DIAGNOSIS — R9431 Abnormal electrocardiogram [ECG] [EKG]: Secondary | ICD-10-CM | POA: Diagnosis not present

## 2021-03-10 DIAGNOSIS — Z20822 Contact with and (suspected) exposure to covid-19: Secondary | ICD-10-CM | POA: Diagnosis not present

## 2021-03-10 DIAGNOSIS — Z9989 Dependence on other enabling machines and devices: Secondary | ICD-10-CM | POA: Diagnosis not present

## 2021-03-10 DIAGNOSIS — I959 Hypotension, unspecified: Secondary | ICD-10-CM | POA: Diagnosis not present

## 2021-03-10 DIAGNOSIS — J9611 Chronic respiratory failure with hypoxia: Secondary | ICD-10-CM | POA: Diagnosis present

## 2021-03-10 DIAGNOSIS — N1832 Chronic kidney disease, stage 3b: Secondary | ICD-10-CM | POA: Diagnosis present

## 2021-03-10 DIAGNOSIS — M109 Gout, unspecified: Secondary | ICD-10-CM | POA: Diagnosis present

## 2021-03-10 DIAGNOSIS — I248 Other forms of acute ischemic heart disease: Secondary | ICD-10-CM | POA: Diagnosis not present

## 2021-03-10 DIAGNOSIS — E1122 Type 2 diabetes mellitus with diabetic chronic kidney disease: Secondary | ICD-10-CM | POA: Diagnosis not present

## 2021-03-10 DIAGNOSIS — K219 Gastro-esophageal reflux disease without esophagitis: Secondary | ICD-10-CM | POA: Diagnosis present

## 2021-03-10 DIAGNOSIS — I130 Hypertensive heart and chronic kidney disease with heart failure and stage 1 through stage 4 chronic kidney disease, or unspecified chronic kidney disease: Secondary | ICD-10-CM | POA: Diagnosis not present

## 2021-03-10 DIAGNOSIS — E1136 Type 2 diabetes mellitus with diabetic cataract: Secondary | ICD-10-CM | POA: Diagnosis not present

## 2021-03-10 DIAGNOSIS — G4733 Obstructive sleep apnea (adult) (pediatric): Secondary | ICD-10-CM | POA: Diagnosis not present

## 2021-03-10 DIAGNOSIS — E785 Hyperlipidemia, unspecified: Secondary | ICD-10-CM | POA: Diagnosis present

## 2021-03-10 DIAGNOSIS — I340 Nonrheumatic mitral (valve) insufficiency: Secondary | ICD-10-CM | POA: Diagnosis present

## 2021-03-10 DIAGNOSIS — R195 Other fecal abnormalities: Secondary | ICD-10-CM

## 2021-03-10 DIAGNOSIS — K922 Gastrointestinal hemorrhage, unspecified: Secondary | ICD-10-CM | POA: Diagnosis not present

## 2021-03-10 DIAGNOSIS — D696 Thrombocytopenia, unspecified: Secondary | ICD-10-CM | POA: Diagnosis not present

## 2021-03-10 DIAGNOSIS — M1990 Unspecified osteoarthritis, unspecified site: Secondary | ICD-10-CM | POA: Diagnosis present

## 2021-03-10 HISTORY — PX: ESOPHAGOGASTRODUODENOSCOPY (EGD) WITH PROPOFOL: SHX5813

## 2021-03-10 HISTORY — PX: BIOPSY: SHX5522

## 2021-03-10 LAB — FOLATE: Folate: 38 ng/mL (ref >5.9)

## 2021-03-10 LAB — URINALYSIS, COMPLETE (UACMP) WITH MICROSCOPIC
Bacteria, UA: NONE SEEN
Bilirubin Urine: NEGATIVE
Glucose, UA: NEGATIVE mg/dL
Hgb urine dipstick: NEGATIVE
Ketones, ur: NEGATIVE mg/dL
Leukocytes,Ua: NEGATIVE
Nitrite: NEGATIVE
Protein, ur: NEGATIVE mg/dL
Specific Gravity, Urine: 1.01 (ref 1.005–1.030)
pH: 5 (ref 5.0–8.0)

## 2021-03-10 LAB — COMPREHENSIVE METABOLIC PANEL
ALT: 19 U/L (ref 0–44)
AST: 25 U/L (ref 15–41)
Albumin: 3.1 g/dL — ABNORMAL LOW (ref 3.5–5.0)
Alkaline Phosphatase: 60 U/L (ref 38–126)
Anion gap: 10 (ref 5–15)
BUN: 156 mg/dL — ABNORMAL HIGH (ref 8–23)
CO2: 22 mmol/L (ref 22–32)
Calcium: 8.8 mg/dL — ABNORMAL LOW (ref 8.9–10.3)
Chloride: 103 mmol/L (ref 98–111)
Creatinine, Ser: 2.98 mg/dL — ABNORMAL HIGH (ref 0.61–1.24)
GFR, Estimated: 20 mL/min — ABNORMAL LOW (ref >60)
Glucose, Bld: 194 mg/dL — ABNORMAL HIGH (ref 70–99)
Potassium: 5 mmol/L (ref 3.5–5.1)
Sodium: 135 mmol/L (ref 135–145)
Total Bilirubin: 1.3 mg/dL — ABNORMAL HIGH (ref 0.3–1.2)
Total Protein: 6.5 g/dL (ref 6.5–8.1)

## 2021-03-10 LAB — GLUCOSE, CAPILLARY
Glucose-Capillary: 189 mg/dL — ABNORMAL HIGH (ref 70–99)
Glucose-Capillary: 232 mg/dL — ABNORMAL HIGH (ref 70–99)

## 2021-03-10 LAB — SODIUM, URINE, RANDOM: Sodium, Ur: 66 mmol/L

## 2021-03-10 LAB — CBG MONITORING, ED
Glucose-Capillary: 181 mg/dL — ABNORMAL HIGH (ref 70–99)
Glucose-Capillary: 176 mg/dL — ABNORMAL HIGH (ref 70–99)

## 2021-03-10 LAB — CBC
HCT: 25.5 % — ABNORMAL LOW (ref 39.0–52.0)
Hemoglobin: 7.9 g/dL — ABNORMAL LOW (ref 13.0–17.0)
MCH: 29.5 pg (ref 26.0–34.0)
MCHC: 31 g/dL (ref 30.0–36.0)
MCV: 95.1 fL (ref 80.0–100.0)
Platelets: 73 10*3/uL — ABNORMAL LOW (ref 150–400)
RBC: 2.68 MIL/uL — ABNORMAL LOW (ref 4.22–5.81)
RDW: 19.6 % — ABNORMAL HIGH (ref 11.5–15.5)
WBC: 7.1 10*3/uL (ref 4.0–10.5)
nRBC: 0 % (ref 0.0–0.2)

## 2021-03-10 LAB — IRON AND TIBC
Iron: 24 ug/dL — ABNORMAL LOW (ref 45–182)
Saturation Ratios: 6 % — ABNORMAL LOW (ref 17.9–39.5)
TIBC: 406 ug/dL (ref 250–450)
UIBC: 382 ug/dL

## 2021-03-10 LAB — CREATININE, URINE, RANDOM: Creatinine, Urine: 29.05 mg/dL

## 2021-03-10 LAB — PROTIME-INR
INR: 1.5 — ABNORMAL HIGH (ref 0.8–1.2)
Prothrombin Time: 17.7 seconds — ABNORMAL HIGH (ref 11.4–15.2)

## 2021-03-10 LAB — RETICULOCYTES
Immature Retic Fract: 30.5 % — ABNORMAL HIGH (ref 2.3–15.9)
RBC.: 2.55 MIL/uL — ABNORMAL LOW (ref 4.22–5.81)
Retic Count, Absolute: 116 10*3/uL (ref 19.0–186.0)
Retic Ct Pct: 4.6 % — ABNORMAL HIGH (ref 0.4–3.1)

## 2021-03-10 LAB — OSMOLALITY: Osmolality: 353 mOsm/kg (ref 275–295)

## 2021-03-10 LAB — OSMOLALITY, URINE: Osmolality, Ur: 394 mOsm/kg (ref 300–900)

## 2021-03-10 LAB — MAGNESIUM: Magnesium: 2.1 mg/dL (ref 1.7–2.4)

## 2021-03-10 LAB — TSH: TSH: 3.108 u[IU]/mL (ref 0.350–4.500)

## 2021-03-10 LAB — FERRITIN: Ferritin: 32 ng/mL (ref 24–336)

## 2021-03-10 SURGERY — ESOPHAGOGASTRODUODENOSCOPY (EGD) WITH PROPOFOL
Anesthesia: Monitor Anesthesia Care

## 2021-03-10 MED ORDER — PHENYLEPHRINE HCL-NACL 10-0.9 MG/250ML-% IV SOLN
INTRAVENOUS | Status: DC | PRN
  Administered 2021-03-10: 30 ug/min via INTRAVENOUS

## 2021-03-10 MED ORDER — INSULIN ASPART 100 UNIT/ML IJ SOLN
0.0000 [IU] | Freq: Four times a day (QID) | INTRAMUSCULAR | Status: DC
  Administered 2021-03-10: 1 [IU] via SUBCUTANEOUS
  Administered 2021-03-11: 2 [IU] via SUBCUTANEOUS

## 2021-03-10 MED ORDER — ATORVASTATIN CALCIUM 10 MG PO TABS
20.0000 mg | ORAL_TABLET | Freq: Every day | ORAL | Status: DC
  Administered 2021-03-10 – 2021-03-15 (×6): 20 mg via ORAL
  Filled 2021-03-10 (×6): qty 2

## 2021-03-10 MED ORDER — FUROSEMIDE 10 MG/ML IJ SOLN
20.0000 mg | Freq: Once | INTRAMUSCULAR | Status: AC
  Administered 2021-03-10: 20 mg via INTRAVENOUS
  Filled 2021-03-10: qty 2

## 2021-03-10 MED ORDER — LATANOPROST 0.005 % OP SOLN
1.0000 [drp] | Freq: Every day | OPHTHALMIC | Status: DC
  Administered 2021-03-10 – 2021-03-15 (×5): 1 [drp] via OPHTHALMIC
  Filled 2021-03-10: qty 2.5

## 2021-03-10 MED ORDER — ALPRAZOLAM 0.25 MG PO TABS
0.2500 mg | ORAL_TABLET | Freq: Every evening | ORAL | Status: DC | PRN
  Administered 2021-03-13 – 2021-03-15 (×3): 0.25 mg via ORAL
  Filled 2021-03-10 (×3): qty 1

## 2021-03-10 MED ORDER — PROPOFOL 500 MG/50ML IV EMUL
INTRAVENOUS | Status: DC | PRN
  Administered 2021-03-10: 75 ug/kg/min via INTRAVENOUS

## 2021-03-10 MED ORDER — INSULIN DETEMIR 100 UNIT/ML ~~LOC~~ SOLN
4.0000 [IU] | Freq: Two times a day (BID) | SUBCUTANEOUS | Status: DC
  Administered 2021-03-10 – 2021-03-11 (×3): 4 [IU] via SUBCUTANEOUS
  Filled 2021-03-10 (×8): qty 0.04

## 2021-03-10 MED ORDER — SODIUM CHLORIDE 0.9 % IV SOLN
INTRAVENOUS | Status: DC
  Administered 2021-03-10: 500 mL via INTRAVENOUS

## 2021-03-10 MED ORDER — EPHEDRINE SULFATE-NACL 50-0.9 MG/10ML-% IV SOSY
PREFILLED_SYRINGE | INTRAVENOUS | Status: DC | PRN
  Administered 2021-03-10 (×2): 10 mg via INTRAVENOUS

## 2021-03-10 SURGICAL SUPPLY — 14 items

## 2021-03-10 NOTE — Op Note (Signed)
[facility] Patient Name: Scott [name] Procedure Date : 03/10/2021 MRN: [id number] Attending MD: Docia Chuck. Henrene Pastor , MD Date of Birth: Jul 15, 1933 CSN: [id number] Age: 87 Admit Type: Inpatient Procedure:                Upper GI endoscopy with biopsies Indications:              Progressive anemia (macrocytic in the face of                            significant renal insufficiency), Heme positive                            stool. Reports of dark stools. He uses Pepto-Bismol                            regularly. Placed on iron by PCP in recent months.                            Takes aspirin but no recent PPI use Providers:                Docia Chuck. Henrene Pastor, MD, Dulcy Fanny, Elspeth Cho                            Tech., Technician, Viann Fish, CRNA Referring MD:             [facility] Medicines:                Monitored Anesthesia Care Complications:            No immediate complications. Estimated Blood Loss:     Estimated blood loss: none. Procedure:                Pre-Anesthesia Assessment:                           - Prior to the procedure, a History and Physical                            was performed, and patient medications and                            allergies were reviewed. The patient's tolerance of                            previous anesthesia was also reviewed. The risks                            and benefits of the procedure and the sedation                            options and risks were discussed with the patient.                            All questions were answered, and informed consent  was obtained. Prior Anticoagulants: The patient has                            taken no previous anticoagulant or antiplatelet                            agents. ASA Grade Assessment: III - A patient with                            severe systemic disease. After reviewing the risks                            and benefits, the  patient was deemed in                            satisfactory condition to undergo the procedure.                           After obtaining informed consent, the endoscope was                            passed under direct vision. Throughout the                            procedure, the patient's blood pressure, pulse, and                            oxygen saturations were monitored continuously. The                            GIF-H190 ([id number]) Olympus gastroscope was                            introduced through the mouth, and advanced to the                            second part of duodenum. The upper GI endoscopy was                            accomplished without difficulty. The patient                            tolerated the procedure well. Scope In: Scope Out: Findings:      The esophagus was normal. No esophagitis. No varices      Few small non-bleeding superficial gastric ulcers were found in the       gastric antrum. Biopsies were taken with a cold forceps for histology.      The stomach was otherwise normal.. No blood or active bleeding. No       varices. No hiatal hernia.      The examined duodenum revealed superficial erosive duodenitis involving       the bulb. Post bulbar duodenum was normal. No active bleeding or blood..      The cardia and gastric fundus were normal on retroflexion. Impression:  1. Multiple small antral ulcers and erosive                            duodenitis. This explains Hemoccult positive stool.                           2. Otherwise unremarkable EGD. No active bleeding                            or significant lesions.                           3. Multifactorial anemia including advanced renal                            disease Recommendation:           1. Resume previous diet                           2. Transfuse to clinically appropriate hemoglobin                           3. Recommend daily PPI (pantoprazole 40 mg daily)                             given small gastric ulcers and erosive duodenitis                            as well as medicinal aspirin use.                           4. Follow-up biopsies to rule out Helicobacter                            pylori (we will follow-up).                           5. Given the patient's renal disease, he may                            benefit from an erythropoiesis stimulating agent.                            You may want to refer him to hematology for                            outpatient monitoring                           6. No further plans from GI standpoint. No                            outpatient GI follow-up needed at this point. We  will sign off.                           Discussed with patient. He was provided a copy of                            his report                           . Procedure Code(s):        --- Professional ---                           [id number], Esophagogastroduodenoscopy, flexible,                            transoral; with biopsy, single or multiple Diagnosis Code(s):        --- Professional ---                           K25.9, Gastric ulcer, unspecified as acute or                            chronic, without hemorrhage or perforation                           D50.9, Iron deficiency anemia, unspecified                           R19.5, Other fecal abnormalities CPT copyright 2019 American Medical Association. All rights reserved. The codes documented in this report are preliminary and upon coder review may  be revised to meet current compliance requirements. Docia Chuck. Henrene Pastor, MD 03/10/2021 2:27:04 PM This report has been signed electronically. Number of Addenda: 0

## 2021-03-10 NOTE — Progress Notes (Signed)
PROGRESS NOTE  Scott [name] WJX:[id number] DOB: [birth year] DOA: 03/09/2021 PCP: Reubin Milan, MD   LOS: 0 days   Brief Narrative / Interim history: This is an 87 year old male with history of chronic thrombocytopenia, DM2, HTN, HLD, CKD 3B, chronic combined CHF, chronic iron deficiency anemia who comes into the hospital with shortness of breath.  He reports several days of black stools which are new for him.  He was found to have a hemoglobin of 6.1, GI was consulted and he was admitted to the hospital  Subjective / 24h Interval events: Is doing well this morning, feels a little bit stronger after getting blood transfusions overnight.   Assessment & Plan: Principal Problem Acute blood loss anemia due to acute upper GI bleed, melena-patient presents to the hospital with melena and sudden hemoglobin drop when compared to his baseline -Placed on PPI infusion, GI consulted, awaiting input.  He is hemodynamically stable -Received 2 units of packed red blood cells, repeat CBC pending  Active Problems Acute kidney injury on chronic kidney disease stage IIIb-Baseline creatinine 2.2-2.6, current creatinine is 3.06.  This is likely related to #1, anticipate to improve after blood transfusion  Elevated troponin-patient denies any chest pain, overall is flat, not in a pattern consistent with ACS.  Likely demand ischemia in the setting of anemia  Chronic hypoxic respiratory failure-patient states that he uses home oxygen.  Chest x-ray raises possibility of pulmonary fibrosis, may benefit from outpatient pulmonary follow-up.  He saw pulmonary years ago for sleep apnea  Thrombocytopenia-chronic, contributing to his bleed  Hyperkalemia-in the setting of acute kidney injury, repeat labs this morning pending  DM2-he is on 70/30 insulin at home, continue detemir and place on sliding scale every 6 hours while n.p.o.  Chronic combined CHF due to ischemic cardiomyopathy-most recent echo about a year ago  in April 2021 shows an EF of 35%, moderate pulmonary hypertension.  Received Lasix this morning after 2 units of packed red blood cells.  Appears euvolemic, hold home Lasix, hold carvedilol in the setting of GI bleed.  His blood pressure is low normal  Hyperlipidemia-continue Lipitor  Essential hypertension-soft blood pressure, hold home medications  Coronary artery disease-with history of CABG, most recent cath 2018 with patent grafts.  No chest pain currently.  Aspirin on hold   Scheduled Meds: . insulin aspart  0-6 Units Subcutaneous Q6H  . insulin detemir  4 Units Subcutaneous BID  . latanoprost  1 drop Both Eyes QHS   Continuous Infusions: . pantoprozole (PROTONIX) infusion Stopped (03/10/21 0729)   PRN Meds:.  Diet Orders (From admission, onward)    Start     Ordered   03/09/21 2136  Diet NPO time specified  Diet effective now        03/09/21 2136          DVT prophylaxis: SCDs Start: 03/09/21 2135     Code Status: DNR  Family Communication: No family at bedside  Status is: Observation  The patient will require care spanning > 2 midnights and should be moved to inpatient because: IV treatments appropriate due to intensity of illness or inability to take PO and Inpatient level of care appropriate due to severity of illness  Dispo: The patient is from: Home              Anticipated d/c is to: Home              Patient currently is not medically stable to d/c.   Difficult to place  patient No   Level of care: Progressive  Consultants:  Gastroenterology  Procedures:  none  Microbiology  none  Antimicrobials: none    Objective: Vitals:   03/10/21 0315 03/10/21 0436 03/10/21 0730 03/10/21 0900  BP: 126/63 106/66 (!) 103/48 117/77  Pulse: 81 (!) 54 (!) 45 (!) 57  Resp: (!) 21 (!) 21 19 16   Temp:  97.9 F (36.6 C) 97.8 F (36.6 C)   TempSrc:  Oral Oral   SpO2: 97% 100% 100% 100%  Weight:      Height:        Intake/Output Summary (Last 24 hours)  at 03/10/2021 0925 Last data filed at 03/10/2021 7371 Gross per 24 hour  Intake 1553.5 ml  Output --  Net 1553.5 ml   Filed Weights   03/09/21 1627  Weight: 93 kg    Examination: Constitutional: NAD Eyes: no scleral icterus ENMT: Mucous membranes are moist.  Neck: normal, supple Respiratory: Few scattered rhonchi at the bases, no wheezing, no crackles, normal respiratory effort Cardiovascular: Regular rate and rhythm, no murmurs / rubs / gallops.  No peripheral edema Abdomen: non distended, no tenderness. Bowel sounds positive.  Musculoskeletal: no clubbing / cyanosis.  Skin: no rashes Neurologic: No focal deficits Psychiatric: Normal judgment and insight. Alert and oriented x 3. Normal mood.    Data Reviewed: I have independently reviewed following labs and imaging studies   CBC: Recent Labs  Lab 03/09/21 1621  WBC 7.6  NEUTROABS 5.3  HGB 6.1*  HCT 20.8*  MCV 101.5*  PLT 86*   Basic Metabolic Panel: Recent Labs  Lab 03/09/21 1621  NA 134*  K 5.4*  CL 103  CO2 19*  GLUCOSE 209*  BUN 157*  CREATININE 3.06*  CALCIUM 8.9   Liver Function Tests: Recent Labs  Lab 03/09/21 1621  AST 20  ALT 17  ALKPHOS 73  BILITOT 1.1  PROT 6.9  ALBUMIN 3.2*   Coagulation Profile: No results for input(s): INR, PROTIME in the last 168 hours. HbA1C: No results for input(s): HGBA1C in the last 72 hours. CBG: Recent Labs  Lab 03/10/21 0639  GLUCAP 181*    Recent Results (from the past 240 hour(s))  Resp Panel by RT-PCR (Flu A&B, Covid) Nasopharyngeal Swab     Status: None   Collection Time: 03/09/21  9:38 PM   Specimen: Nasopharyngeal Swab; Nasopharyngeal(NP) swabs in vial transport medium  Result Value Ref Range Status   SARS Coronavirus 2 by RT PCR NEGATIVE NEGATIVE Final    Comment: (NOTE) SARS-CoV-2 target nucleic acids are NOT DETECTED.  The SARS-CoV-2 RNA is generally detectable in upper respiratory specimens during the acute phase of infection. The  lowest concentration of SARS-CoV-2 viral copies this assay can detect is 138 copies/mL. A negative result does not preclude SARS-Cov-2 infection and should not be used as the sole basis for treatment or other patient management decisions. A negative result may occur with  improper specimen collection/handling, submission of specimen other than nasopharyngeal swab, presence of viral mutation(s) within the areas targeted by this assay, and inadequate number of viral copies(<138 copies/mL). A negative result must be combined with clinical observations, patient history, and epidemiological information. The expected result is Negative.  Fact Sheet for Patients:  EntrepreneurPulse.com.au  Fact Sheet for Healthcare Providers:  IncredibleEmployment.be  This test is no t yet approved or cleared by the [place] FDA and  has been authorized for detection and/or diagnosis of SARS-CoV-2 by FDA under an Emergency Use Authorization (EUA). This  EUA will remain  in effect (meaning this test can be used) for the duration of the COVID-19 declaration under Section 564(b)(1) of the Act, 21 U.S.C.section 360bbb-3(b)(1), unless the authorization is terminated  or revoked sooner.       Influenza A by PCR NEGATIVE NEGATIVE Final   Influenza B by PCR NEGATIVE NEGATIVE Final    Comment: (NOTE) The Xpert Xpress SARS-CoV-2/FLU/RSV plus assay is intended as an aid in the diagnosis of influenza from Nasopharyngeal swab specimens and should not be used as a sole basis for treatment. Nasal washings and aspirates are unacceptable for Xpert Xpress SARS-CoV-2/FLU/RSV testing.  Fact Sheet for Patients: EntrepreneurPulse.com.au  Fact Sheet for Healthcare Providers: IncredibleEmployment.be  This test is not yet approved or cleared by the [place] FDA and has been authorized for detection and/or diagnosis of SARS-CoV-2 by FDA under  an Emergency Use Authorization (EUA). This EUA will remain in effect (meaning this test can be used) for the duration of the COVID-19 declaration under Section 564(b)(1) of the Act, 21 U.S.C. section 360bbb-3(b)(1), unless the authorization is terminated or revoked.  Performed at [facility], [place] [address] [place], [place] [address]      Radiology Studies: DG Ribs Unilateral W/Chest Right  Result Date: 03/09/2021 CLINICAL DATA:  Fall EXAM: RIGHT RIBS AND CHEST - 3+ VIEW COMPARISON:  October 15, 2017 FINDINGS: The cardiomediastinal silhouette is unchanged and enlarged in contour.Status post median sternotomy and CABG. Atherosclerotic calcifications. No pleural effusion. No pneumothorax. Similar appearance of coarse bibasilar reticular opacities. Surgical sutures and clips project over the RIGHT abdomen. Multilevel degenerative changes of the thoracic spine. Contour deformity of the RIGHT anterior eighth rib consistent with subacute to chronic prior fracture. No acute displaced rib fracture. IMPRESSION: 1. Subacute to chronic rib fracture of the RIGHT anterior eighth rib. Recommend correlation with point tenderness. 2. Bibasilar coarse reticular opacities likely reflecting underlying pulmonary fibrosis. Electronically Signed   By: Valentino Saxon MD   On: 03/09/2021 17:04    Marzetta Board, MD, PhD [facility]  Between 7 am - 7 pm I am available, please contact me via Amion (for emergencies) or Securechat (non urgent messages)  Between 7 pm - 7 am I am not available, please contact night coverage MD/APP via Amion

## 2021-03-10 NOTE — ED Notes (Signed)
CBG: 176 °

## 2021-03-10 NOTE — Consult Note (Addendum)
Referring Provider:  [facility]         Primary Care Physician:  Reubin Milan, MD Primary Gastroenterologist:  Wilfrid Lund, MD             We were asked to see this patient for:    GI bleed              ASSESSMENT / PLAN:    # 87 yo male with  FOBT+ and severe macrocytic anemia (Hgb 6.1, down from 12 in December 2021).  Stools chronically black on bismuth but lately have been black when not taking bismuth ( however he started oral iron several months ago). He takes a daily baby ASA. His Health care provider stopped his PPI several weeks ago.  BUN markedly elevated at 157. Even though black colored stools can be explained by bismuth and oral iron, still suspect upper GI bleed.  --Received 2 u PRBC this am. Awaiting follow up hgb --He will need an EGD this admission ( once hyperkalemia resolves, hgb shown to be improved). His elevated high sensitivity troponin is felt by Hospitalist to be secondary to 02 supply / demand mismatch.  --Continue PPI infusion, serial H+H --Last colonoscopy ~ 10 years ago. ? Need for colonoscopy this admission will depend on clinical course / EGD finding.   ADDENDUM:  WILL REPEAT BMET AND CBC. IF POTASSIUM AND HGB IMPROVED WITH PROCEED WITH EGD TODAY  # GERD.  One of his healthcare providers stopped his PPI several weeks ago.  He has been having some recurrent heartburn since  # CKD on AKI. Cr 3.06. GFR 19  # Chronic thrombocytopenia, rule out cirrhosis --check INR  # History of choledocholithiasis status post ERCP with biliary sphincterotomy and stone extraction January 2019  # History of partial colectomy ( ? Reason).  Per our office note in 2018 , patient has a history of a colon polyp with carcinoma in situ in 2005.  Hyperplastic polyp was removed at time of colonoscopy in 2008      HPI:                                                                                                                             Chief Complaint:  Anemia,  weakness  Scott [name] is a 87 y.o. male with a past medical history significant for CKD, combined systolic and diastolic CHF,HTN, DM2. HLD, CKD, possibly ILD, colon polyps ( cancer in situ in 2005),  chronic thrombocytopenia, C -diff infection Aug 2021.   Patient was evaluated in our office by Dr. Loletha Carrow in November 2018 for upper abdominal pain .  EGD in 2019 for evaluation of chest pain was negative .  Patient was seen again in August 2021 for evaluation of diarrhea .  C. difficile study returned positive, he was treated with vancomycin.    Patient presented to ED yesterday with SOB and dizziness. In ED Hgb was 6.1, MCV 101.5, platelets 86, K+  5.4, FOBT+, BUN 157, Cr 3.06. high sensitivity troponin 364.  Gives a history of chronic black stool but says he takes Pepto-Bismol on a regular basis for chronic diarrhea.  He has noticed lately that stools are black even if he does not take Pepto bismuth.  However, he says that he was started on oral iron several months ago.  He takes a daily baby aspirin, denies any other NSAIDs.  He has a history of GERD but one of his healthcare providers stopped Protonix several weeks ago.  He has since had some recurrent heartburn.  No abdominal pain, nausea, vomiting nor weight loss.  Patient says his last colonoscopy was approximately 10 years ago.  DATA REVIEWED:  EKG EKG Interpretation  Date/Time:                  Monday Mar 09 2021 16:18:17 EDT Ventricular Rate:         62 PR Interval:                   QRS Duration: 124 QT Interval:                 482 QTC Calculation:        489 R Axis:                         89 Text Interpretation:      Wide QRS rhythm Inferior infarct , age undetermined Anterolateral infarct , age undetermined Abnormal ECG Since last tracing now with inferior T wave inversion Otherwise no significant change Confirmed by Daleen Bo [phone]) on 03/09/2021 4:56:01 PM   Right rib Xray IMPRESSION: 1. Subacute to chronic rib fracture of  the RIGHT anterior eighth rib. Recommend correlation with point tenderness. 2. Bibasilar coarse reticular opacities likely reflecting underlying pulmonary fibrosis.   PREVIOUS ENDOSCOPIC EVALUATIONS / PERTINENT STUDIES   December 2018 EGD for chest pain was normal    Past Medical History:  Diagnosis Date  . Acute biliary pancreatitis 10/2017   with choledocholithiasis.  ERCP, stone extraction performed  . Arthritis   . CAD (coronary artery disease)    a. 1997 CABGx3 (VG->RPDA, VG->PLV, LIMA->LAD);  b. 07/2004 PCI VG->PLV (3.5x16 Taxus DES, 3.5x12 Taxus DES); c. 11/2009 PCI: VG->PLV 3.5x12 Promus DES), VG->RPDA (3.5x15 Promus DES); d. 08/2012 Cath: patent grafts; e. 05/2013 Lexiscan MV: no ischemia/infarction, EF 44%.  . Cancer [facility]) 2005   colon surgery done  . Cataract   . Chronic combined systolic and diastolic CHF (congestive heart failure) ([place]) 08/2012   a. EF 50-55% 11/2011; b. 08/2012 - EF 25-30%,  with mild RM, mod TR, mod RAE & LAE, moderate reduced RV systolic function and PASP of 30mmHg. - > cath showed patent grafts & Mod Puml HTN wth elevated PCWP;  c. 01/2014 Echo: EF 35-40%, mod LVH, mod HK, mildly dil LA, mod dil RA, PASP 105mmHg.  . CKD (chronic kidney disease), stage III ([place])   . Dyslipidemia   . GERD (gastroesophageal reflux disease)   . Gout   . History of home oxygen therapy    uses oxygen nightly with cpap   . Hyperkalemia   . Hypertension   . Insulin Dependent Diabetes mellitus    type 2  . Myocardial infarction ([place]) 1997  . PAD (peripheral artery disease) ([place])   . Sleep apnea   . Thrombocytopenia ([place]) 2011   dates back to 2011  . UTI (lower urinary tract infection) 08/2012  E.coli    Past Surgical History:  Procedure Laterality Date  . ABDOMINAL AORTOGRAM W/LOWER EXTREMITY Right 01/04/2020   Abdominal aortogram with right lower extremity runoff  . ABDOMINAL AORTOGRAM W/LOWER EXTREMITY Bilateral 01/04/2020   Procedure: ABDOMINAL AORTOGRAM W/LOWER  EXTREMITY;  Surgeon: Elam Dutch, MD;  Location: [place] CV LAB;  Service: Vascular;  Laterality: Bilateral;  . AMPUTATION Right 02/25/2020   Procedure: RIGHT FIFTH TOE AMPUTATION;  Surgeon: Elam Dutch, MD;  Location: [facility] OR;  Service: Vascular;  Laterality: Right;  . AMPUTATION Right 10/06/2020   Procedure: RIGHT FOURTH TOE AMPUTATION WITH RESECTION OF METATARSAL HEAD;  Surgeon: Elam Dutch, MD;  Location: [place];  Service: Vascular;  Laterality: Right;  . APPENDECTOMY    . BACK SURGERY     15 years ago lower  . CARDIAC CATHETERIZATION  2005   stenting of the vein graft to the posterior lateral per -- Dr. Martinique      . CARDIAC CATHETERIZATION  2011   showed right coronary, totally occluded LAD and severe stenosis in both saphenous vein graft to the posterior lateral and posterior descending  -- stenting of the proximal portion of the SVG to the posterior lateral branch in 2/11  . CHOLECYSTECTOMY    . COLONOSCOPY     x several - colon polyps  . CORONARY ARTERY BYPASS GRAFT  1997    (LIMA to LAD, SVG to PDA, SVG to PL)  . ERCP N/A 10/17/2017    Irene Shipper, MD;  [facility] ENDOSCOPY; choledocholithiasis, biliary pancreatitis.  s/p sphincterotomy and stone extraction.    . ESOPHAGOGASTRODUODENOSCOPY (EGD) WITH PROPOFOL N/A 09/21/2017   Procedure: ESOPHAGOGASTRODUODENOSCOPY (EGD) WITH PROPOFOL;  Surgeon: Doran Stabler, MD;  Location: [facility] ENDOSCOPY;  Service: Gastroenterology;  Laterality: N/A;  . EYE SURGERY     ioc for cataract  . HERNIA REPAIR    . LEFT AND RIGHT HEART CATHETERIZATION WITH CORONARY/GRAFT ANGIOGRAM  09/04/2012   Procedure: LEFT AND RIGHT HEART CATHETERIZATION WITH Beatrix Fetters;  Surgeon: Sherren Mocha, MD;  Location: [facility] CATH LAB;  patent grafts, mod 2ndary pulmonary HTN, elevated LV EDP & PCWP  . LEFT HEART CATH AND CORS/GRAFTS ANGIOGRAPHY N/A 06/17/2017   Procedure: LEFT HEART CATH AND CORS/GRAFTS ANGIOGRAPHY;  Surgeon: Martinique, Peter M, MD;   Location: [place] CV LAB;  Service: Cardiovascular;  Laterality: N/A;  . mohs     x several  . PARTIAL COLECTOMY     cancerous polyps  . stent to heart  last done 5 to 6 yrs ago   x 4  . UPPER GI ENDOSCOPY      Prior to Admission medications   Medication Sig Start Date End Date Taking? Authorizing Provider  ALPRAZolam Duanne Moron) 0.25 MG tablet Take 1 tablet at bedtime as needed for sleep Patient taking differently: Take 0.25 mg by mouth at bedtime as needed for sleep. 12/15/20  Yes Martinique, Peter M, MD  aspirin 81 MG EC tablet Take 81 mg by mouth at bedtime.   Yes [provider]  atorvastatin (LIPITOR) 20 MG tablet Take 1 tablet (20 mg total) by mouth daily. Patient taking differently: Take 20 mg by mouth at bedtime. 03/02/21 02/25/22 Yes Martinique, Peter M, MD  bismuth subsalicylate (PEPTO BISMOL) 262 MG/15ML suspension Take 30 mLs by mouth every 6 (six) hours as needed for indigestion.   Yes [provider]  carvedilol (COREG) 25 MG tablet Take 1 tablet (25 mg total) by mouth 2 (two) times daily. Patient  taking differently: Take 12.5 mg by mouth 2 (two) times daily. 03/02/21 02/25/22 Yes Martinique, Peter M, MD  Cholecalciferol (VITAMIN D) 50 MCG (2000 UT) tablet Take 2,000 Units by mouth every Monday, Wednesday, and Friday.   Yes [provider]  colchicine 0.6 MG tablet Take 0.6-1.2 mg by mouth See admin instructions. Take 2 tablets (1.2 mg) by mouth at onset of gout attack, may take 1 tablet (0.6 mg) one hour later if still needed. Max 3 tablets in 3 days   Yes [provider]  Febuxostat 80 MG TABS Take 40 mg by mouth every morning. 02/14/20  Yes [provider]  ferrous sulfate 325 (65 FE) MG tablet Take 325 mg by mouth every Monday, Wednesday, and Friday.   Yes [provider]  fluticasone (FLONASE) 50 MCG/ACT nasal spray Place 2 sprays into both nostrils daily as needed for allergies or rhinitis.   Yes [provider]  furosemide  (LASIX) 40 MG tablet Take 40 mg by mouth See admin instructions. Take one tablet (40 mg) by mouth daily, may increase to 2 tablets (80 mg) daily as needed for weight gain of 2-3 lbs or leg swelling   Yes [provider]  insulin aspart protamine- aspart (NOVOLOG MIX 70/30) (70-30) 100 UNIT/ML injection Inject 30 Units into the skin 2 (two) times daily with a meal.    Yes [provider]  latanoprost (XALATAN) 0.005 % ophthalmic solution Place 1 drop into both eyes at bedtime.   Yes [provider]  Multiple Vitamin (MULTIVITAMIN WITH MINERALS) TABS tablet Take 1 tablet by mouth daily.   Yes [provider]  nitroGLYCERIN (NITROSTAT) 0.4 MG SL tablet Place 1 tablet (0.4 mg total) under the tongue every 5 (five) minutes as needed for chest pain (up to 3 doses). 02/20/15  Yes Martinique, Peter M, MD  PRESCRIPTION MEDICATION Inhale into the lungs at bedtime. CPAP   Yes [provider]  tamsulosin (FLOMAX) 0.4 MG CAPS capsule Take 0.4 mg by mouth at bedtime.   Yes [provider]  allopurinol (ZYLOPRIM) 100 MG tablet Take 1 tablet (100 mg total) by mouth daily. Patient not taking: No sig reported 01/17/20   Martinique, Peter M, MD  oxyCODONE-acetaminophen (PERCOCET/ROXICET) 5-325 MG tablet Take 1-2 tablets by mouth every 6 (six) hours as needed for severe pain. Patient not taking: No sig reported 10/16/20   Elam Dutch, MD  pantoprazole (PROTONIX) 40 MG tablet Take 1 tablet (40 mg total) by mouth 2 (two) times daily. Patient taking differently: Take 40 mg by mouth daily before breakfast. 06/09/17   Almyra Deforest, PA    Current Facility-Administered Medications  Medication Dose Route Frequency Provider Last Rate Last Admin  . insulin aspart (novoLOG) injection 0-6 Units  0-6 Units Subcutaneous Q6H Howerter, Justin B, DO   1 Units at 03/10/21 0710  . insulin detemir (LEVEMIR) injection 4 Units  4 Units Subcutaneous BID Howerter, Justin B, DO      . latanoprost  (XALATAN) 0.005 % ophthalmic solution 1 drop  1 drop Both Eyes QHS Howerter, Justin B, DO      . pantoprazole (PROTONIX) 80 mg in sodium chloride 0.9 % 100 mL (0.8 mg/mL) infusion  8 mg/hr Intravenous Continuous Howerter, Justin B, DO   Stopped at 03/10/21 7322   Current Outpatient Medications  Medication Sig Dispense Refill  . ALPRAZolam (XANAX) 0.25 MG tablet Take 1 tablet at bedtime as needed for sleep (Patient taking differently: Take 0.25 mg by mouth  at bedtime as needed for sleep.) 30 tablet 1  . aspirin 81 MG EC tablet Take 81 mg by mouth at bedtime.    Marland Kitchen atorvastatin (LIPITOR) 20 MG tablet Take 1 tablet (20 mg total) by mouth daily. (Patient taking differently: Take 20 mg by mouth at bedtime.) 90 tablet 3  . bismuth subsalicylate (PEPTO BISMOL) 262 MG/15ML suspension Take 30 mLs by mouth every 6 (six) hours as needed for indigestion.    . carvedilol (COREG) 25 MG tablet Take 1 tablet (25 mg total) by mouth 2 (two) times daily. (Patient taking differently: Take 12.5 mg by mouth 2 (two) times daily.) 180 tablet 3  . Cholecalciferol (VITAMIN D) 50 MCG (2000 UT) tablet Take 2,000 Units by mouth every Monday, Wednesday, and Friday.    . colchicine 0.6 MG tablet Take 0.6-1.2 mg by mouth See admin instructions. Take 2 tablets (1.2 mg) by mouth at onset of gout attack, may take 1 tablet (0.6 mg) one hour later if still needed. Max 3 tablets in 3 days    . Febuxostat 80 MG TABS Take 40 mg by mouth every morning.    . ferrous sulfate 325 (65 FE) MG tablet Take 325 mg by mouth every Monday, Wednesday, and Friday.    . fluticasone (FLONASE) 50 MCG/ACT nasal spray Place 2 sprays into both nostrils daily as needed for allergies or rhinitis.    . furosemide (LASIX) 40 MG tablet Take 40 mg by mouth See admin instructions. Take one tablet (40 mg) by mouth daily, may increase to 2 tablets (80 mg) daily as needed for weight gain of 2-3 lbs or leg swelling    . insulin aspart protamine- aspart (NOVOLOG MIX  70/30) (70-30) 100 UNIT/ML injection Inject 30 Units into the skin 2 (two) times daily with a meal.     . latanoprost (XALATAN) 0.005 % ophthalmic solution Place 1 drop into both eyes at bedtime.    . Multiple Vitamin (MULTIVITAMIN WITH MINERALS) TABS tablet Take 1 tablet by mouth daily.    . nitroGLYCERIN (NITROSTAT) 0.4 MG SL tablet Place 1 tablet (0.4 mg total) under the tongue every 5 (five) minutes as needed for chest pain (up to 3 doses). 25 tablet 3  . PRESCRIPTION MEDICATION Inhale into the lungs at bedtime. CPAP    . tamsulosin (FLOMAX) 0.4 MG CAPS capsule Take 0.4 mg by mouth at bedtime.    Marland Kitchen allopurinol (ZYLOPRIM) 100 MG tablet Take 1 tablet (100 mg total) by mouth daily. (Patient not taking: No sig reported) 60 tablet 1  . oxyCODONE-acetaminophen (PERCOCET/ROXICET) 5-325 MG tablet Take 1-2 tablets by mouth every 6 (six) hours as needed for severe pain. (Patient not taking: No sig reported) 30 tablet 0  . pantoprazole (PROTONIX) 40 MG tablet Take 1 tablet (40 mg total) by mouth 2 (two) times daily. (Patient taking differently: Take 40 mg by mouth daily before breakfast.) 180 tablet 3    Allergies as of 03/09/2021  . (No Known Allergies)    Family History  Problem Relation Age of Onset  . Coronary artery disease Father   . Heart attack Father   . Coronary artery disease Brother        1/2 brother   . Kidney disease Sister   . Cardiomyopathy Mother   . Asthma Brother     Social History   Socioeconomic History  . Marital status: Single    Spouse name: Not on file  . Number of children: 0  . Years of education:  Not on file  . Highest education level: Not on file  Occupational History  . Occupation: retired   ran a Office manager course  Tobacco Use  . Smoking status: Former Smoker    Packs/day: 1.50    Years: 35.00    Pack years: 52.50    Types: Cigarettes    Quit date: 10/12/1983    Years since quitting: 37.4  . Smokeless tobacco: Never Used  Vaping Use  . Vaping Use: Never  used  Substance and Sexual Activity  . Alcohol use: Yes    Alcohol/week: 3.0 - 4.0 standard drinks    Types: 3 - 4 Standard drinks or equivalent per week  . Drug use: No  . Sexual activity: Not Currently    Birth control/protection: None  Other Topics Concern  . Not on file  Social History Narrative   Lives in [place] by himself.  Active but does not routinely exercise.   Social Determinants of Health   Financial Resource Strain: Not on file  Food Insecurity: Not on file  Transportation Needs: Not on file  Physical Activity: Not on file  Stress: Not on file  Social Connections: Not on file  Intimate Partner Violence: Not on file    Review of Systems: All systems reviewed and negative except where noted in HPI.    OBJECTIVE:    Physical Exam: Vital signs in last 24 hours: Temp:  [97.4 F (36.3 C)-97.9 F (36.6 C)] 97.8 F (36.6 C) (05/31 0730) Pulse Rate:  [45-119] 45 (05/31 0730) Resp:  [13-25] 19 (05/31 0730) BP: (102-135)/(42-100) 103/48 (05/31 0730) SpO2:  [91 %-100 %] 100 % (05/31 0730) Weight:  [93 kg] 93 kg (05/30 1627)   General:   Alert well developed male in NAD Psych:  Pleasant, cooperative. Normal mood and affect. Eyes:  Pupils equal, sclera clear, no icterus.   Conjunctiva pink. Ears:  Normal auditory acuity. Nose:  No deformity, discharge,  or lesions. Neck:  Supple; no masses Lungs:  Clear throughout to auscultation.   No wheezes, crackles, or rhonchi.  Heart:  Regular rate and rhythm; , !+ BLE edema Abdomen:  Soft, non-distended, nontender, BS active, no palp mass . Large area of RUQ bruising Rectal:  Deferred  Msk:  Symmetrical without gross deformities. . Neurologic:  Alert and  oriented x4;  grossly normal neurologically. Skin:  Intact without significant lesions or rashes.  Filed Weights   03/09/21 1627  Weight: 93 kg     Scheduled inpatient medications . insulin aspart  0-6 Units Subcutaneous Q6H  . insulin detemir  4 Units  Subcutaneous BID  . latanoprost  1 drop Both Eyes QHS      Intake/Output from previous day: 05/30 0701 - 05/31 0700 In: 640 [I.V.:10; Blood:630] Out: -  Intake/Output this shift: Total I/O In: 90.9 [I.V.:90.9] Out: -    Lab Results: Recent Labs    03/09/21 1621  WBC 7.6  HGB 6.1*  HCT 20.8*  PLT 86*   BMET Recent Labs    03/09/21 1621  NA 134*  K 5.4*  CL 103  CO2 19*  GLUCOSE 209*  BUN 157*  CREATININE 3.06*  CALCIUM 8.9   LFT Recent Labs    03/09/21 1621  PROT 6.9  ALBUMIN 3.2*  AST 20  ALT 17  ALKPHOS 73  BILITOT 1.1   PT/INR No results for input(s): LABPROT, INR in the last 72 hours. Hepatitis Panel No results for input(s): HEPBSAG, HCVAB, HEPAIGM, HEPBIGM in the last 72 hours.   Marland Kitchen  CBC Latest Ref Rng & Units 03/09/2021 10/06/2020 05/08/2020  WBC 4.0 - 10.5 K/uL 7.6 - 4.7  Hemoglobin 13.0 - 17.0 g/dL 6.1(LL) 12.2(L) 12.2(L)  Hematocrit 39.0 - 52.0 % 20.8(L) 36.0(L) 41.4  Platelets 150 - 400 K/uL 86(L) - 103(L)    . CMP Latest Ref Rng & Units 03/09/2021 10/06/2020 09/23/2020  Glucose 70 - 99 mg/dL 209(H) 143(H) 237(H)  BUN 8 - 23 mg/dL 157(H) 85(H) 82(HH)  Creatinine 0.61 - 1.24 mg/dL 3.06(H) 2.50(H) 2.24(H)  Sodium 135 - 145 mmol/L 134(L) 140 135  Potassium 3.5 - 5.1 mmol/L 5.4(H) 4.0 4.6  Chloride 98 - 111 mmol/L 103 100 93(L)  CO2 22 - 32 mmol/L 19(L) - 26  Calcium 8.9 - 10.3 mg/dL 8.9 - 9.0  Total Protein 6.5 - 8.1 g/dL 6.9 - -  Total Bilirubin 0.3 - 1.2 mg/dL 1.1 - -  Alkaline Phos 38 - 126 U/L 73 - -  AST 15 - 41 U/L 20 - -  ALT 0 - 44 U/L 17 - -   Studies/Results: DG Ribs Unilateral W/Chest Right  Result Date: 03/09/2021 CLINICAL DATA:  Fall EXAM: RIGHT RIBS AND CHEST - 3+ VIEW COMPARISON:  October 15, 2017 FINDINGS: The cardiomediastinal silhouette is unchanged and enlarged in contour.Status post median sternotomy and CABG. Atherosclerotic calcifications. No pleural effusion. No pneumothorax. Similar appearance of coarse  bibasilar reticular opacities. Surgical sutures and clips project over the RIGHT abdomen. Multilevel degenerative changes of the thoracic spine. Contour deformity of the RIGHT anterior eighth rib consistent with subacute to chronic prior fracture. No acute displaced rib fracture. IMPRESSION: 1. Subacute to chronic rib fracture of the RIGHT anterior eighth rib. Recommend correlation with point tenderness. 2. Bibasilar coarse reticular opacities likely reflecting underlying pulmonary fibrosis. Electronically Signed   By: Valentino Saxon MD   On: 03/09/2021 17:04    Principal Problem:   Acute upper GI bleed Active Problems:   Hypertension   Dyslipidemia   Diabetes mellitus, type II, insulin dependent (HCC)   OSA (obstructive sleep apnea)   Chronic combined systolic and diastolic heart failure (HCC)   Acute on chronic blood loss anemia   Acute kidney injury superimposed on CKD ([place])   Elevated troponin   Shortness of breath    Tye Savoy, NP-C @  03/10/2021, 8:49 AM  GI ATTENDING  History, laboratories, x-rays reviewed.  Patient personally seen and examined.  Being evaluated for anemia and Hemoccult-positive stool.  I suspect that his anemia has been progressive as somebody placed him on iron in recent months.  No evidence for iron deficiency.  He does report dark stools.  He has been hemodynamically stable.  He has significant renal insufficiency.  His MCV is elevated.  Also, elevated INR 1.5 and thrombocytopenia.  Previous CT scan suggested cirrhosis.  He may indeed have an element of hepatic cirrhosis, also contributing.  I suspect that his anemia is multifactorial, mostly related to progressive renal disease.  However, given reports of dark and Hemoccult positive stools as well as a relative elevation in BUN, we will proceed with upper endoscopy.  Patient is high risk given his comorbidities and age.The nature of the procedure, as well as the risks, benefits, and alternatives were  carefully and thoroughly reviewed with the patient. Ample time for discussion and questions allowed. The patient understood, was satisfied, and agreed to proceed.  He has had an appropriate rise in hemoglobin with transfusion and his potassium has normalized.  I do not think this patient warrants colonoscopy.  Docia Chuck. Geri Seminole., M.D. [facility] of Gastroenterology

## 2021-03-10 NOTE — Anesthesia Procedure Notes (Signed)
Procedure Name: MAC Date/Time: 03/10/2021 2:02 PM Performed by: Griffin Dakin, CRNA Pre-anesthesia Checklist: Patient identified, Emergency Drugs available, Suction available, Patient being monitored and Timeout performed Patient Re-evaluated:Patient Re-evaluated prior to induction Oxygen Delivery Method: Nasal cannula Induction Type: IV induction Placement Confirmation: positive ETCO2 and breath sounds checked- equal and bilateral Dental Injury: Teeth and Oropharynx as per pre-operative assessment

## 2021-03-10 NOTE — Progress Notes (Signed)
CRITICAL RESULT PROVIDER NOTIFICATION  Test performed and critical result:  Serum osmolality 353  Date and time result received:  03/10/2021 1325  Provider name/title: Vista Lawman RN  Date and time provider notified: 03/10/2021 1329  Date and time provider responded: 03/10/2021 1329  Provider response:No new orders

## 2021-03-10 NOTE — Transfer of Care (Signed)
Immediate Anesthesia Transfer of Care Note  Patient: Scott [name]  Procedure(s) Performed: ESOPHAGOGASTRODUODENOSCOPY (EGD) WITH PROPOFOL (N/A ) BIOPSY  Patient Location: Endoscopy Unit  Anesthesia Type:MAC  Level of Consciousness: awake, oriented and drowsy  Airway & Oxygen Therapy: Patient Spontanous Breathing and Patient connected to nasal cannula oxygen  Post-op Assessment: Report given to RN and Post -op Vital signs reviewed and stable  Post vital signs: Reviewed and stable  Last Vitals:  Vitals Value Taken Time  BP 97/28 03/10/21 1423  Temp    Pulse 58 03/10/21 1424  Resp 20 03/10/21 1424  SpO2 100 % 03/10/21 1424  Vitals shown include unvalidated device data.  Last Pain:  Vitals:   03/10/21 1254  TempSrc:   PainSc: 0-No pain         Complications: No complications documented.

## 2021-03-10 NOTE — Anesthesia Postprocedure Evaluation (Signed)
Anesthesia Post Note  Patient: Scott [name]  Procedure(s) Performed: ESOPHAGOGASTRODUODENOSCOPY (EGD) WITH PROPOFOL (N/A ) BIOPSY     Patient location during evaluation: PACU Anesthesia Type: MAC Level of consciousness: awake and alert and oriented Pain management: pain level controlled Vital Signs Assessment: post-procedure vital signs reviewed and stable Respiratory status: spontaneous breathing, nonlabored ventilation and respiratory function stable Cardiovascular status: blood pressure returned to baseline Postop Assessment: no apparent nausea or vomiting Anesthetic complications: no   No complications documented.      Brennan Bailey

## 2021-03-10 NOTE — Anesthesia Preprocedure Evaluation (Addendum)
Anesthesia Evaluation  Patient identified by MRN, date of birth, ID band Patient awake    Reviewed: Allergy & Precautions, NPO status , Patient's Chart, lab work & pertinent test results, reviewed documented beta blocker date and time   History of Anesthesia Complications Negative for: history of anesthetic complications  Airway Mallampati: II  TM Distance: >3 FB Neck ROM: Full    Dental  (+) Edentulous Upper, Missing,    Pulmonary shortness of breath and with exertion, sleep apnea, Continuous Positive Airway Pressure Ventilation and Oxygen sleep apnea , former smoker,  Quit smoking 1985, 52 pack year history  Chronic hypoxic respiratory failure- on home O2 at night CXR w/ c/f pulmonary fibrosis, does not have a pulmonologist    Pulmonary exam normal        Cardiovascular hypertension, Pt. on medications and Pt. on home beta blockers pulmonary hypertension (moderate pHTN)+ CAD, + Past MI, + Cardiac Stents (2011), + CABG (1997 CABG x 3), + Peripheral Vascular Disease, +CHF (LVEF 35%) and + DOE  Normal cardiovascular exam+ Valvular Problems/Murmurs (mild-mod MR, mild AI) MR and AI   Echo 01/2020: 1. Left ventricular ejection fraction, by estimation, is 35%%. The left  ventricle has severely decreased function. The left ventricle demonstrates  global hypokinesis. The left ventricular internal cavity size was severely  dilated. Left ventricular  diastolic parameters are indeterminate.  2. Right ventricular systolic function is mildly reduced. The right  ventricular size is mildly enlarged. There is moderately elevated  pulmonary artery systolic pressure.  3. Left atrial size was mild to moderately dilated.  4. Right atrial size was moderately dilated.  5. The mitral valve is abnormal. Mild to moderate mitral valve  regurgitation.  6. The aortic valve is abnormal. Aortic valve regurgitation is mild. Mild  aortic valve sclerosis  is present, with no evidence of aortic valve  stenosis.  7. Aortic dilatation noted. There is mild dilatation of the ascending  aorta measuring 41 mm.  8. The inferior vena cava is dilated in size with <50% respiratory  variability, suggesting right atrial pressure of 15 mmHg.   Cath 2018: 1. Severe 2 vessel occlusive CAD.    - 100% proximal LAD    - 100% mid RCA 2. Patent LIMA to the LAD 3. Patent SVG to PDA 4. Patent SVG to PLOM 5. Moderate to severe LV dysfunction. EF estimated at 35%. 6. Normal LVEDP.    Neuro/Psych negative neurological ROS  negative psych ROS   GI/Hepatic Neg liver ROS, GERD  Medicated and Controlled,Several black stools at home, Hb 6.1 on admission- now 7.9 s/p 2 units prbc overnight    Endo/Other  diabetes, Poorly Controlled, Type 2, Insulin Dependent  Renal/GU CRF and Renal InsufficiencyRenal diseaseCr 3.06, AKI on known CKD 3- baseline around 2  negative genitourinary   Musculoskeletal  (+) Arthritis , Osteoarthritis,    Abdominal   Peds  Hematology  (+) Blood dyscrasia, anemia , GIB H/H 7.9/25.5 plt 73- chronic thrombocytopenia    Anesthesia Other Findings   Reproductive/Obstetrics negative OB ROS                           Anesthesia Physical Anesthesia Plan  ASA: IV  Anesthesia Plan: MAC   Post-op Pain Management:    Induction:   PONV Risk Score and Plan: 2 and Propofol infusion and TIVA  Airway Management Planned: Natural Airway and Simple Face Mask  Additional Equipment: None  Intra-op Plan:  Post-operative Plan:   Informed Consent: I have reviewed the patients History and Physical, chart, labs and discussed the procedure including the risks, benefits and alternatives for the proposed anesthesia with the patient or authorized representative who has indicated his/her understanding and acceptance.   Patient has DNR.  Discussed DNR with patient and Suspend DNR.     Plan Discussed with:  CRNA  Anesthesia Plan Comments:        Anesthesia Quick Evaluation

## 2021-03-11 ENCOUNTER — Encounter (HOSPITAL_COMMUNITY): Payer: Self-pay | Admitting: Internal Medicine

## 2021-03-11 DIAGNOSIS — R9431 Abnormal electrocardiogram [ECG] [EKG]: Secondary | ICD-10-CM

## 2021-03-11 DIAGNOSIS — D649 Anemia, unspecified: Secondary | ICD-10-CM

## 2021-03-11 LAB — SURGICAL PATHOLOGY

## 2021-03-11 LAB — GLUCOSE, CAPILLARY
Glucose-Capillary: 202 mg/dL — ABNORMAL HIGH (ref 70–99)
Glucose-Capillary: 259 mg/dL — ABNORMAL HIGH (ref 70–99)
Glucose-Capillary: 306 mg/dL — ABNORMAL HIGH (ref 70–99)
Glucose-Capillary: 247 mg/dL — ABNORMAL HIGH (ref 70–99)

## 2021-03-11 LAB — BASIC METABOLIC PANEL
Anion gap: 7 (ref 5–15)
BUN: 145 mg/dL — ABNORMAL HIGH (ref 8–23)
CO2: 22 mmol/L (ref 22–32)
Calcium: 8.6 mg/dL — ABNORMAL LOW (ref 8.9–10.3)
Chloride: 105 mmol/L (ref 98–111)
Creatinine, Ser: 2.93 mg/dL — ABNORMAL HIGH (ref 0.61–1.24)
GFR, Estimated: 20 mL/min — ABNORMAL LOW (ref >60)
Glucose, Bld: 315 mg/dL — ABNORMAL HIGH (ref 70–99)
Potassium: 5.2 mmol/L — ABNORMAL HIGH (ref 3.5–5.1)
Sodium: 134 mmol/L — ABNORMAL LOW (ref 135–145)

## 2021-03-11 MED ORDER — INSULIN ASPART 100 UNIT/ML IJ SOLN
0.0000 [IU] | Freq: Three times a day (TID) | INTRAMUSCULAR | Status: DC
  Administered 2021-03-11: 7 [IU] via SUBCUTANEOUS
  Administered 2021-03-12 (×2): 1 [IU] via SUBCUTANEOUS
  Administered 2021-03-13: 3 [IU] via SUBCUTANEOUS
  Administered 2021-03-14: 1 [IU] via SUBCUTANEOUS
  Administered 2021-03-14 (×2): 2 [IU] via SUBCUTANEOUS
  Administered 2021-03-15: 1 [IU] via SUBCUTANEOUS
  Administered 2021-03-15: 2 [IU] via SUBCUTANEOUS
  Administered 2021-03-15 – 2021-03-16 (×2): 1 [IU] via SUBCUTANEOUS
  Administered 2021-03-16: 2 [IU] via SUBCUTANEOUS

## 2021-03-11 MED ORDER — INSULIN DETEMIR 100 UNIT/ML ~~LOC~~ SOLN
10.0000 [IU] | Freq: Two times a day (BID) | SUBCUTANEOUS | Status: DC
  Administered 2021-03-11 – 2021-03-12 (×3): 10 [IU] via SUBCUTANEOUS
  Filled 2021-03-11 (×5): qty 0.1

## 2021-03-11 MED ORDER — PANTOPRAZOLE SODIUM 40 MG PO TBEC
40.0000 mg | DELAYED_RELEASE_TABLET | Freq: Two times a day (BID) | ORAL | Status: DC
  Administered 2021-03-11 – 2021-03-16 (×11): 40 mg via ORAL
  Filled 2021-03-11 (×11): qty 1

## 2021-03-11 MED ORDER — SODIUM POLYSTYRENE SULFONATE 15 GM/60ML PO SUSP
15.0000 g | Freq: Once | ORAL | Status: AC
  Administered 2021-03-11: 15 g via ORAL
  Filled 2021-03-11: qty 60

## 2021-03-11 MED ORDER — OXYCODONE-ACETAMINOPHEN 5-325 MG PO TABS
1.0000 | ORAL_TABLET | Freq: Four times a day (QID) | ORAL | Status: DC | PRN
  Administered 2021-03-14 – 2021-03-15 (×4): 1 via ORAL
  Filled 2021-03-11: qty 2
  Filled 2021-03-11: qty 1
  Filled 2021-03-11: qty 2
  Filled 2021-03-11: qty 1

## 2021-03-11 MED ORDER — NITROGLYCERIN 0.4 MG SL SUBL: 0.4000 mg | SUBLINGUAL_TABLET | SUBLINGUAL | Status: DC | PRN

## 2021-03-11 MED ORDER — TAMSULOSIN HCL 0.4 MG PO CAPS
0.4000 mg | ORAL_CAPSULE | Freq: Every day | ORAL | Status: DC
  Administered 2021-03-11 – 2021-03-15 (×5): 0.4 mg via ORAL
  Filled 2021-03-11 (×5): qty 1

## 2021-03-11 MED ORDER — INSULIN ASPART 100 UNIT/ML IJ SOLN
0.0000 [IU] | Freq: Every day | INTRAMUSCULAR | Status: DC
  Administered 2021-03-11 – 2021-03-15 (×2): 2 [IU] via SUBCUTANEOUS

## 2021-03-11 MED ORDER — CARVEDILOL 25 MG PO TABS
25.0000 mg | ORAL_TABLET | Freq: Two times a day (BID) | ORAL | Status: DC
  Administered 2021-03-11 – 2021-03-14 (×3): 25 mg via ORAL
  Filled 2021-03-11 (×9): qty 1

## 2021-03-11 MED ORDER — INSULIN ASPART 100 UNIT/ML IJ SOLN
3.0000 [IU] | Freq: Three times a day (TID) | INTRAMUSCULAR | Status: DC
  Administered 2021-03-11 – 2021-03-16 (×11): 3 [IU] via SUBCUTANEOUS

## 2021-03-11 NOTE — Progress Notes (Signed)
[facility] PROGRESS NOTE    Progress Note  Scott [name]  IOE:[id number] DOB: 04-09-1933 DOA: 03/09/2021 PCP: Reubin Milan, MD     Brief Narrative:   Scott [name] is an 87 y.o. male past medical history of chronic thrombocytopenia, diabetes mellitus type 2, essential hypertension chronic kidney disease stage III, chronic combined systolic and diastolic heart failure iron deficiency anemia comes into the hospital for shortness of breath and several days of black tarry stool.  In the ED was found to have a hemoglobin of 6 GI was consulted and admitted to the hospital.    Assessment/Plan:   Acute upper GI bleed secondary to multiple gastric ulcers: Started on PPI infusion, transfused 2 units of packed red blood cells GI was consulted. 2022 that showed multiple small antral ulcers and erosive duodenitis. To resume daily PPI and regular diet. Follow-up biopsy results outpatient. Hemoglobin stable 7.9 recheck CBC tomorrow morning.  Acute kidney injury on chronic kidney disease stage IIIb: Likely related to #1 after blood transfusion creatinine started to improve.  Evaded troponins: Denies any chest pain likely demand ischemia in the setting of lower GI bleed.  Chronic respiratory failure with hypoxia: Chest x-ray showed pulmonary fibrosis will need to follow-up with pulmonary as an outpatient. Continue current home oxygen.  Chronic thrombocytopenia: To be contributing to his GI bleed will need to follow-up with hematology as an outpatient. Question MDS, will need biopsy as an outpatient.  Hyperkalemia: In setting of acute kidney injury, resolved with IV fluid hydration basic metabolic panel is pending this morning.  Diabetes mellitus type 2: Poorly controlled blood glucose trending up, will go ahead and increase his long-acting insulin and start him on sliding scale insulin with meal coverage.  Dyslipidemia Continue statins.  Essential hypertension: Blood pressure  is soft continue to hold home meds.  Chronic combined systolic and diastolic heart failure (HCC) Stable appears euvolemic with blood pressure started to improve we will go ahead and start restarting home meds.  DVT prophylaxis: SCD Family Communication:none Status is: Inpatient  Remains inpatient appropriate because:Hemodynamically unstable   Dispo: The patient is from: Home              Anticipated d/c is to: Home              Patient currently is not medically stable to d/c.   Difficult to place patient No        Code Status:     Code Status Orders  (From admission, onward)         Start     Ordered   03/10/21 0112  Do not attempt resuscitation (DNR)  Continuous       Question Answer Comment  In the event of cardiac or respiratory ARREST Do not call a "code blue"   In the event of cardiac or respiratory ARREST Do not perform Intubation, CPR, defibrillation or ACLS   In the event of cardiac or respiratory ARREST Use medication by any route, position, wound care, and other measures to relive pain and suffering. May use oxygen, suction and manual treatment of airway obstruction as needed for comfort.      03/10/21 0111        Code Status History    Date Active Date Inactive Code Status Order ID Comments User Context   03/09/2021 2136 03/10/2021 0111 Full Code [id number]  Rhetta Mura DO ED   02/25/2020 1545 02/26/2020 1845 Full Code [id number]  Elam Dutch, MD Inpatient  01/03/2020 1409 01/09/2020 2323 Full Code [id number]  Orbie Hurst Inpatient   10/16/2017 0159 10/18/2017 1607 Full Code [id number]  Vilma Prader, MD Inpatient   06/17/2017 1436 06/17/2017 2156 Full Code [id number]  [place], Peter M, MD Inpatient   05/21/2017 1557 05/22/2017 1810 Full Code [id number]  Rogelia Mire, NP Inpatient   05/12/2013 1847 05/13/2013 2058 Full Code [id number]  Sharmon Revere Inpatient   Advance Care Planning Activity        IV Access:    Peripheral  IV   Procedures and diagnostic studies:   DG Ribs Unilateral W/Chest Right  Result Date: 03/09/2021 CLINICAL DATA:  Fall EXAM: RIGHT RIBS AND CHEST - 3+ VIEW COMPARISON:  October 15, 2017 FINDINGS: The cardiomediastinal silhouette is unchanged and enlarged in contour.Status post median sternotomy and CABG. Atherosclerotic calcifications. No pleural effusion. No pneumothorax. Similar appearance of coarse bibasilar reticular opacities. Surgical sutures and clips project over the RIGHT abdomen. Multilevel degenerative changes of the thoracic spine. Contour deformity of the RIGHT anterior eighth rib consistent with subacute to chronic prior fracture. No acute displaced rib fracture. IMPRESSION: 1. Subacute to chronic rib fracture of the RIGHT anterior eighth rib. Recommend correlation with point tenderness. 2. Bibasilar coarse reticular opacities likely reflecting underlying pulmonary fibrosis. Electronically Signed   By: Valentino Saxon MD   On: 03/09/2021 17:04     Medical Consultants:    None.   Subjective:    Scott [name] relates he is feeling better than yesterday.  Objective:    Vitals:   03/10/21 2050 03/11/21 0005 03/11/21 0356 03/11/21 0816  BP: 119/72 117/65 102/88 131/62  Pulse: (!) 55 (!) 56 82 65  Resp: 19 18 19    Temp: (!) 97.5 F (36.4 C) (!) 97.5 F (36.4 C) 97.9 F (36.6 C) (!) 97.5 F (36.4 C)  TempSrc: Oral Oral Oral Oral  SpO2: 98% 97% 93% 95%  Weight:   92.9 kg   Height:       SpO2: 95 % O2 Flow Rate (L/min): 1 L/min   Intake/Output Summary (Last 24 hours) at 03/11/2021 1147 Last data filed at 03/11/2021 0828 Gross per 24 hour  Intake 1362.71 ml  Output 2200 ml  Net -837.29 ml   Filed Weights   03/09/21 1627 03/10/21 1225 03/11/21 0356  Weight: 93 kg 92.4 kg 92.9 kg    Exam: General exam: In no acute distress. Respiratory system: Good air movement and clear to auscultation. Cardiovascular system: S1 & S2 heard, RRR. No JVD. Gastrointestinal  system: Abdomen is nondistended, soft and nontender.  Extremities: No pedal edema. Skin: No rashes, lesions or ulcers Psychiatry: Judgement and insight appear normal. Mood & affect appropriate.    Data Reviewed:    Labs: Basic Metabolic Panel: Recent Labs  Lab 03/09/21 1621 03/10/21 1029  NA 134* 135  K 5.4* 5.0  CL 103 103  CO2 19* 22  GLUCOSE 209* 194*  BUN 157* 156*  CREATININE 3.06* 2.98*  CALCIUM 8.9 8.8*  MG  --  2.1   GFR Estimated Creatinine Clearance: 19.5 mL/min (A) (by C-G formula based on SCr of 2.98 mg/dL (H)). Liver Function Tests: Recent Labs  Lab 03/09/21 1621 03/10/21 1029  AST 20 25  ALT 17 19  ALKPHOS 73 60  BILITOT 1.1 1.3*  PROT 6.9 6.5  ALBUMIN 3.2* 3.1*   No results for input(s): LIPASE, AMYLASE in the last 168 hours. No results for input(s): AMMONIA in the last 168 hours. Coagulation  profile Recent Labs  Lab 03/10/21 1029  INR 1.5*   COVID-19 Labs  Recent Labs    03/10/21 1029  FERRITIN 32    Lab Results  Component Value Date   SARSCOV2NAA NEGATIVE 03/09/2021   SARSCOV2NAA NEGATIVE 10/02/2020   [place] NEGATIVE 02/21/2020    CBC: Recent Labs  Lab 03/09/21 1621 03/10/21 1029  WBC 7.6 7.1  NEUTROABS 5.3  --   HGB 6.1* 7.9*  HCT 20.8* 25.5*  MCV 101.5* 95.1  PLT 86* 73*   Cardiac Enzymes: No results for input(s): CKTOTAL, CKMB, CKMBINDEX, TROPONINI in the last 168 hours. BNP (last 3 results) No results for input(s): PROBNP in the last 8760 hours. CBG: Recent Labs  Lab 03/10/21 0923 03/10/21 1613 03/10/21 2103 03/11/21 0618 03/11/21 1104  GLUCAP 176* 189* 232* 202* 259*   D-Dimer: No results for input(s): DDIMER in the last 72 hours. Hgb A1c: No results for input(s): HGBA1C in the last 72 hours. Lipid Profile: No results for input(s): CHOL, HDL, LDLCALC, TRIG, CHOLHDL, LDLDIRECT in the last 72 hours. Thyroid function studies: Recent Labs    03/10/21 1029  TSH 3.108   Anemia work up: Recent  Labs    03/10/21 1029  FOLATE 38.0  FERRITIN 32  TIBC 406  IRON 24*  RETICCTPCT 4.6*   Sepsis Labs: Recent Labs  Lab 03/09/21 1621 03/10/21 1029  WBC 7.6 7.1   Microbiology Recent Results (from the past 240 hour(s))  Resp Panel by RT-PCR (Flu A&B, Covid) Nasopharyngeal Swab     Status: None   Collection Time: 03/09/21  9:38 PM   Specimen: Nasopharyngeal Swab; Nasopharyngeal(NP) swabs in vial transport medium  Result Value Ref Range Status   SARS Coronavirus 2 by RT PCR NEGATIVE NEGATIVE Final    Comment: (NOTE) SARS-CoV-2 target nucleic acids are NOT DETECTED.  The SARS-CoV-2 RNA is generally detectable in upper respiratory specimens during the acute phase of infection. The lowest concentration of SARS-CoV-2 viral copies this assay can detect is 138 copies/mL. A negative result does not preclude SARS-Cov-2 infection and should not be used as the sole basis for treatment or other patient management decisions. A negative result may occur with  improper specimen collection/handling, submission of specimen other than nasopharyngeal swab, presence of viral mutation(s) within the areas targeted by this assay, and inadequate number of viral copies(<138 copies/mL). A negative result must be combined with clinical observations, patient history, and epidemiological information. The expected result is Negative.  Fact Sheet for Patients:  EntrepreneurPulse.com.au  Fact Sheet for Healthcare Providers:  IncredibleEmployment.be  This test is no t yet approved or cleared by the [place] FDA and  has been authorized for detection and/or diagnosis of SARS-CoV-2 by FDA under an Emergency Use Authorization (EUA). This EUA will remain  in effect (meaning this test can be used) for the duration of the COVID-19 declaration under Section 564(b)(1) of the Act, 21 U.S.C.section 360bbb-3(b)(1), unless the authorization is terminated  or revoked sooner.        Influenza A by PCR NEGATIVE NEGATIVE Final   Influenza B by PCR NEGATIVE NEGATIVE Final    Comment: (NOTE) The Xpert Xpress SARS-CoV-2/FLU/RSV plus assay is intended as an aid in the diagnosis of influenza from Nasopharyngeal swab specimens and should not be used as a sole basis for treatment. Nasal washings and aspirates are unacceptable for Xpert Xpress SARS-CoV-2/FLU/RSV testing.  Fact Sheet for Patients: EntrepreneurPulse.com.au  Fact Sheet for Healthcare Providers: IncredibleEmployment.be  This test is not yet approved or  cleared by the [place] and has been authorized for detection and/or diagnosis of SARS-CoV-2 by FDA under an Emergency Use Authorization (EUA). This EUA will remain in effect (meaning this test can be used) for the duration of the COVID-19 declaration under Section 564(b)(1) of the Act, 21 U.S.C. section 360bbb-3(b)(1), unless the authorization is terminated or revoked.  Performed at [facility], [place] [address] [place], [place] [address]      Medications:   . atorvastatin  20 mg Oral QHS  . insulin aspart  0-6 Units Subcutaneous Q6H  . insulin detemir  4 Units Subcutaneous BID  . latanoprost  1 drop Both Eyes QHS   Continuous Infusions: . pantoprozole (PROTONIX) infusion 8 mg/hr (03/11/21 0720)      LOS: 1 day   Charlynne Cousins  [facility]  03/11/2021, 11:47 AM

## 2021-03-11 NOTE — NC FL2 (Signed)
[place] LEVEL OF CARE SCREENING TOOL     IDENTIFICATION  Patient Name: Scott [name] Birthdate: [birth year] Sex: male Admission Date (Current Location): 03/09/2021  [facility] and [place] Number:  Herbalist and Address:  [facility], [place] [address] [place], [place] [address]      Provider Number: [id number]  Attending Physician Name and Address:  Charlynne Cousins, MD  Relative Name and Phone Number:       Current Level of Care: Hospital Recommended Level of Care: [place] Prior Approval Number:    Date Approved/Denied:   PASRR Number: [id number]  Discharge Plan: SNF    Current Diagnoses: Patient Active Problem List   Diagnosis Date Noted  . Acute on chronic blood loss anemia 03/10/2021  . Acute kidney injury superimposed on CKD ([place]) 03/10/2021  . Elevated troponin 03/10/2021  . Shortness of breath 03/10/2021  . GI bleed 03/10/2021  . Multiple gastric ulcers   . Acute upper GI bleed 03/09/2021  . Osteomyelitis of fifth toe of right foot ([place]) 02/25/2020  . PAD (peripheral artery disease) ([place]) 01/03/2020  . Choledocholithiasis   . Pancreatitis 10/16/2017  . Midsternal chest pain 05/21/2017  . Dupuytren's contracture 02/25/2016  . Nausea - without vomiting 01/01/2015  . Low grade fever 01/01/2015  . DOE (dyspnea on exertion) 01/01/2015  . Unstable angina ([place]) 05/12/2013  . Chronic combined systolic and diastolic heart failure ([place]) 05/12/2013  . Personal history of other malignant neoplasm of skin 04/05/2013  . CKD (chronic kidney disease) stage 3, GFR 30-59 ml/min (HCC) 09/01/2012  . OSA (obstructive sleep apnea) 07/04/2012  . Dyspnea 11/05/2011  . Diabetes mellitus, type II, insulin dependent ([place]) 04/07/2011  . Hypertension   . Ischemic cardiomyopathy   . Myocardial infarction ([place])   . Dyslipidemia   . Arteriosclerotic coronary artery disease     Orientation RESPIRATION BLADDER Height  & Weight     Self,Time,Situation,Place  Normal Continent Weight: 204 lb 14.4 oz (92.9 kg) Height:  6' (182.9 cm)  BEHAVIORAL SYMPTOMS/MOOD NEUROLOGICAL BOWEL NUTRITION STATUS      Continent Diet (See d/c summary)  AMBULATORY STATUS COMMUNICATION OF NEEDS Skin   Extensive Assist Verbally Normal                       Personal Care Assistance Level of Assistance  Bathing,Feeding,Dressing Bathing Assistance: Maximum assistance Feeding assistance: Independent Dressing Assistance: Limited assistance     Functional Limitations Info  Sight,Hearing,Speech Sight Info: Adequate Hearing Info: Adequate Speech Info: Adequate    SPECIAL CARE FACTORS FREQUENCY  PT (By licensed PT),OT (By licensed OT)     PT Frequency: 5x/week OT Frequency: 5x/week            Contractures Contractures Info: Not present    Additional Factors Info  Code Status,Allergies Code Status Info: DNR Allergies Info: No known allergies           Current Medications (03/11/2021):  This is the current hospital active medication list Current Facility-Administered Medications  Medication Dose Route Frequency Provider Last Rate Last Admin  . ALPRAZolam (XANAX) tablet 0.25 mg  0.25 mg Oral QHS PRN Caren Griffins, MD      . atorvastatin (LIPITOR) tablet 20 mg  20 mg Oral QHS Caren Griffins, MD   20 mg at 03/10/21 2102  . carvedilol (COREG) tablet 25 mg  25 mg Oral BID Charlynne Cousins, MD   25 mg  at 03/11/21 1233  . insulin aspart (novoLOG) injection 0-5 Units  0-5 Units Subcutaneous QHS Charlynne Cousins, MD      . insulin aspart (novoLOG) injection 0-9 Units  0-9 Units Subcutaneous TID WC Charlynne Cousins, MD      . insulin aspart (novoLOG) injection 3 Units  3 Units Subcutaneous TID WC Charlynne Cousins, MD   3 Units at 03/11/21 1234  . insulin detemir (LEVEMIR) injection 10 Units  10 Units Subcutaneous BID Charlynne Cousins, MD      . latanoprost (XALATAN) 0.005 % ophthalmic solution  1 drop  1 drop Both Eyes QHS Irene Shipper, MD   1 drop at 03/10/21 2102  . nitroGLYCERIN (NITROSTAT) SL tablet 0.4 mg  0.4 mg Sublingual Q5 min PRN Charlynne Cousins, MD      . oxyCODONE-acetaminophen (PERCOCET/ROXICET) 5-325 MG per tablet 1-2 tablet  1-2 tablet Oral Q6H PRN Charlynne Cousins, MD      . pantoprazole (PROTONIX) EC tablet 40 mg  40 mg Oral BID Charlynne Cousins, MD   40 mg at 03/11/21 1233  . sodium polystyrene (KAYEXALATE) 15 GM/60ML suspension 15 g  15 g Oral Once Aileen Fass, Tammi Klippel, MD      . tamsulosin [facility]) capsule 0.4 mg  0.4 mg Oral QHS Charlynne Cousins, MD         Discharge Medications: Please see discharge summary for a list of discharge medications.  Relevant Imaging Results:  Relevant Lab Results:   Additional Information SS# Scott [name], [place]

## 2021-03-11 NOTE — Evaluation (Signed)
Physical Therapy Evaluation Patient Details Name: Scott [name] MRN: [id number] DOB: 08-Aug-1933 Today's Date: 03/11/2021   History of Present Illness  87 y.o. male comes into the hospital for shortness of breath and several days of black tarry stool.  In the ED was found to have a hemoglobin of 6 GI was consulted and admitted 03/09/21. PMH: chronic thrombocytopenia, diabetes mellitus type 2, essential hypertension chronic kidney disease stage III, chronic combined systolic and diastolic heart failure iron deficiency anemia  Clinical Impression  PTA pt living alone in single story home with 1 step to enter. Pt reports independence with household mobility using a RW, requires scooter when his brother takes him to the grocery store. Pt reports independence with dressing, struggles however is still getting into the shower. Pt also limited in his ability for cooking and cleaning. Currently, pt limited in safe mobility by neuropathic pain in bilateral feet, in presence of decreased strength, and balance. Pt requires min guard for bed mobility and mod A for transfers and stepping from bed to recliner with RW. PT recommending short bout of rehabilitation at SNF prior to return home. PT will continue to follow acutely.     Follow Up Recommendations SNF    Equipment Recommendations  3in1 (PT)    Recommendations for Other Services OT consult     Precautions / Restrictions Precautions Precautions: Fall Restrictions Weight Bearing Restrictions: No      Mobility  Bed Mobility Overal bed mobility: Needs Assistance Bed Mobility: Supine to Sit     Supine to sit: HOB elevated;Min guard     General bed mobility comments: min guard for safety, increased effort and use of bed rail to pull to EoB    Transfers Overall transfer level: Needs assistance Equipment used: Rolling walker (2 wheeled) Transfers: Sit to/from Stand Sit to Stand: Mod assist;From elevated surface         General transfer  comment: modA for power up from elevated bed surface  Ambulation/Gait Ambulation/Gait assistance: Mod assist Gait Distance (Feet): 2 Feet Assistive device: Rolling walker (2 wheeled) Gait Pattern/deviations: Step-to pattern;Decreased step length - right;Decreased step length - left;Shuffle;Trunk flexed;Antalgic Gait velocity: slowed Gait velocity interpretation: <1.31 ft/sec, indicative of household ambulator General Gait Details: modA for steadying with very small antalgic steps from bed to recliner, vc for proximity to RW and upright posture        Balance Overall balance assessment: Needs assistance Sitting-balance support: Feet supported;Feet unsupported;No upper extremity supported Sitting balance-Leahy Scale: Fair     Standing balance support: Bilateral upper extremity supported Standing balance-Leahy Scale: Poor Standing balance comment: requires UE support and outside assist                             Pertinent Vitals/Pain Pain Assessment: Faces Faces Pain Scale: Hurts little more Pain Location: bilateral feet neuropathic pain Pain Descriptors / Indicators: Pins and needles;Shooting;Tingling Pain Intervention(s): Limited activity within patient's tolerance;Monitored during session;Repositioned    Home Living Family/patient expects to be discharged to:: Private residence Living Arrangements: Alone Available Help at Discharge: Family;Available PRN/intermittently Type of Home: House Home Access: Stairs to enter Entrance Stairs-Rails: Right Entrance Stairs-Number of Steps: 1 Home Layout: One level Home Equipment: Walker - 2 wheels;Cane - single point      Prior Function Level of Independence: Needs assistance   Gait / Transfers Assistance Needed: uses RW in home and scooter at grocery store  ADL's / Homemaking Assistance Needed: indpendent  with bathing and dressing, not much cooking and cleaning, manages own medication  Comments: brother provides  transportation        Extremity/Trunk Assessment        Lower Extremity Assessment Lower Extremity Assessment: RLE deficits/detail;LLE deficits/detail RLE Deficits / Details: ROM WFL, strength grossly 3/5 RLE Sensation: history of peripheral neuropathy (ankle height) RLE Coordination: decreased fine motor LLE Deficits / Details: ROM WFL, strength grossly 3/5 LLE Sensation: history of peripheral neuropathy (ankle height) LLE Coordination: decreased fine motor       Communication   Communication: No difficulties  Cognition Arousal/Alertness: Awake/alert Behavior During Therapy: WFL for tasks assessed/performed Overall Cognitive Status: Within Functional Limits for tasks assessed                                        General Comments General comments (skin integrity, edema, etc.): VSS on RA, 3/4 DoE with transfer SaO2 on RA 96%O2        Assessment/Plan    PT Assessment Patient needs continued PT services  PT Problem List Decreased strength;Decreased activity tolerance;Decreased balance;Decreased mobility;Decreased coordination;Impaired sensation;Pain       PT Treatment Interventions DME instruction;Gait training;Functional mobility training;Therapeutic exercise;Therapeutic activities;Balance training;Cognitive remediation;Patient/family education    PT Goals (Current goals can be found in the Care Plan section)  Acute Rehab PT Goals Patient Stated Goal: get mobility back PT Goal Formulation: With patient Time For Goal Achievement: 03/25/21 Potential to Achieve Goals: Fair    Frequency Min 2X/week   Barriers to discharge Decreased caregiver support         AM-PAC PT "6 Clicks" Mobility  Outcome Measure Help needed turning from your back to your side while in a flat bed without using bedrails?: None Help needed moving from lying on your back to sitting on the side of a flat bed without using bedrails?: A Little Help needed moving to and from a  bed to a chair (including a wheelchair)?: A Lot Help needed standing up from a chair using your arms (e.g., wheelchair or bedside chair)?: A Lot Help needed to walk in hospital room?: Total Help needed climbing 3-5 steps with a railing? : Total 6 Click Score: 13    End of Session Equipment Utilized During Treatment: Gait belt Activity Tolerance: Patient limited by pain Patient left: in chair;with call bell/phone within reach;with chair alarm set Nurse Communication: Mobility status;Other (comment) (c/o peripheral neuropathy pain) PT Visit Diagnosis: Unsteadiness on feet (R26.81);Other abnormalities of gait and mobility (R26.89);Muscle weakness (generalized) (M62.81);Difficulty in walking, not elsewhere classified (R26.2);Other symptoms and signs involving the nervous system (R29.898);Pain Pain - Right/Left:  (bilateral) Pain - part of body: Ankle and joints of foot    Time: 1420-1444 PT Time Calculation (min) (ACUTE ONLY): 24 min   Charges:   PT Evaluation $PT Eval Moderate Complexity: 1 Mod PT Treatments $Gait Training: 8-22 mins        Orlando Thalmann B. Migdalia Dk PT, DPT Acute Rehabilitation Services Pager [phone] Office [phone]   [place] 03/11/2021, 3:00 PM

## 2021-03-11 NOTE — TOC Initial Note (Signed)
Transition of Care [facility]) - Initial/Assessment Note    Patient Details  Name: Scott [name] MRN: [id number] Date of Birth: 17-Jul-1933  Transition of [facility]) CM/SW Contact:    Tresa Endo Phone Number: 03/11/2021, 3:44 PM  Clinical Narrative:                 [id number]: CSW spoke with pt and pt brother they are both agreeable to faxing pt out, CSW provided [email or web address] resources for SNF to pt. Pt brother requests [facility] bc it is close to his house and he has no other family to help care for his brother. CSW will fax pt out and follow up with [facility] for bed availability.         Patient Goals and CMS Choice        Expected Discharge Plan and Services                                                Prior Living Arrangements/Services                       Activities of Daily Living      Permission Sought/Granted                  Emotional Assessment              Admission diagnosis:  GI bleed [K92.2] Abnormal EKG [R94.31] Elevated troponin [R77.8] Acute upper GI bleed [K92.2] Gastrointestinal hemorrhage, unspecified gastrointestinal hemorrhage type [K92.2] Anemia, unspecified type [D64.9] Patient Active Problem List   Diagnosis Date Noted  . Acute on chronic blood loss anemia 03/10/2021  . Acute kidney injury superimposed on CKD ([place]) 03/10/2021  . Elevated troponin 03/10/2021  . Shortness of breath 03/10/2021  . GI bleed 03/10/2021  . Multiple gastric ulcers   . Acute upper GI bleed 03/09/2021  . Osteomyelitis of fifth toe of right foot ([place]) 02/25/2020  . PAD (peripheral artery disease) ([place]) 01/03/2020  . Choledocholithiasis   . Pancreatitis 10/16/2017  . Midsternal chest pain 05/21/2017  . Dupuytren's contracture 02/25/2016  . Nausea - without vomiting 01/01/2015  . Low grade fever 01/01/2015  . DOE (dyspnea on exertion) 01/01/2015  . Unstable angina ([place]) 05/12/2013  . Chronic combined systolic and diastolic  heart failure ([place]) 05/12/2013  . Personal history of other malignant neoplasm of skin 04/05/2013  . CKD (chronic kidney disease) stage 3, GFR 30-59 ml/min ([facility]) 09/01/2012  . OSA (obstructive sleep apnea) 07/04/2012  . Dyspnea 11/05/2011  . Diabetes mellitus, type II, insulin dependent ([place]) 04/07/2011  . Hypertension   . Ischemic cardiomyopathy   . Myocardial infarction ([place])   . Dyslipidemia   . Arteriosclerotic coronary artery disease    PCP:  Reubin Milan, MD Pharmacy:   [facility] [place], [place] - [address] AT [address] [facility] OF [place] [address] Phone: [phone] Fax: [name], [place] - [address] [place] [address] Phone: [phone] Fax: [phone]     Social Determinants of Health (SDOH) Interventions    Readmission Risk Interventions Readmission Risk Prevention Plan 01/09/2020  Transportation Screening Complete  PCP or Specialist Appt within 5-7 Days Complete  Home Care Screening Complete  Medication Review (RN  CM) Complete  Some recent data might be hidden

## 2021-03-12 ENCOUNTER — Encounter (HOSPITAL_COMMUNITY): Payer: Medicare HMO

## 2021-03-12 ENCOUNTER — Ambulatory Visit: Payer: Medicare HMO

## 2021-03-12 LAB — GLUCOSE, CAPILLARY
Glucose-Capillary: 145 mg/dL — ABNORMAL HIGH (ref 70–99)
Glucose-Capillary: 129 mg/dL — ABNORMAL HIGH (ref 70–99)
Glucose-Capillary: 118 mg/dL — ABNORMAL HIGH (ref 70–99)
Glucose-Capillary: 99 mg/dL (ref 70–99)

## 2021-03-12 LAB — CBC
HCT: 19.7 % — ABNORMAL LOW (ref 39.0–52.0)
HCT: 24.5 % — ABNORMAL LOW (ref 39.0–52.0)
Hemoglobin: 6.1 g/dL — CL (ref 13.0–17.0)
Hemoglobin: 7.7 g/dL — ABNORMAL LOW (ref 13.0–17.0)
MCH: 29.5 pg (ref 26.0–34.0)
MCH: 29.2 pg (ref 26.0–34.0)
MCHC: 31 g/dL (ref 30.0–36.0)
MCHC: 31.4 g/dL (ref 30.0–36.0)
MCV: 95.2 fL (ref 80.0–100.0)
MCV: 92.8 fL (ref 80.0–100.0)
Platelets: 50 10*3/uL — ABNORMAL LOW (ref 150–400)
Platelets: 50 10*3/uL — ABNORMAL LOW (ref 150–400)
RBC: 2.07 MIL/uL — ABNORMAL LOW (ref 4.22–5.81)
RBC: 2.64 MIL/uL — ABNORMAL LOW (ref 4.22–5.81)
RDW: 18.6 % — ABNORMAL HIGH (ref 11.5–15.5)
RDW: 17.8 % — ABNORMAL HIGH (ref 11.5–15.5)
WBC: 5.8 10*3/uL (ref 4.0–10.5)
WBC: 5.7 10*3/uL (ref 4.0–10.5)
nRBC: 0 % (ref 0.0–0.2)
nRBC: 0 % (ref 0.0–0.2)

## 2021-03-12 LAB — BASIC METABOLIC PANEL
Anion gap: 10 (ref 5–15)
BUN: 139 mg/dL — ABNORMAL HIGH (ref 8–23)
CO2: 23 mmol/L (ref 22–32)
Calcium: 8.5 mg/dL — ABNORMAL LOW (ref 8.9–10.3)
Chloride: 103 mmol/L (ref 98–111)
Creatinine, Ser: 2.69 mg/dL — ABNORMAL HIGH (ref 0.61–1.24)
GFR, Estimated: 22 mL/min — ABNORMAL LOW (ref >60)
Glucose, Bld: 170 mg/dL — ABNORMAL HIGH (ref 70–99)
Potassium: 4.7 mmol/L (ref 3.5–5.1)
Sodium: 136 mmol/L (ref 135–145)

## 2021-03-12 LAB — HEMOGLOBIN A1C
Hgb A1c MFr Bld: 6.7 % — ABNORMAL HIGH (ref 4.8–5.6)
Mean Plasma Glucose: 146 mg/dL

## 2021-03-12 LAB — PREPARE RBC (CROSSMATCH)

## 2021-03-12 MED ORDER — SODIUM CHLORIDE 0.9 % IV SOLN
510.0000 mg | Freq: Once | INTRAVENOUS | Status: AC
  Administered 2021-03-12: 510 mg via INTRAVENOUS
  Filled 2021-03-12: qty 17

## 2021-03-12 MED ORDER — SODIUM CHLORIDE 0.9% IV SOLUTION: Freq: Once | INTRAVENOUS | Status: AC

## 2021-03-12 NOTE — Evaluation (Signed)
Occupational Therapy Evaluation Patient Details Name: Scott [name] MRN: [id number] DOB: July 18, 1933 Today's Date: 03/12/2021    History of Present Illness 87 y.o. male comes into the hospital for shortness of breath and several days of black tarry stool.  In the ED was found to have a hemoglobin of 6 GI was consulted and admitted 03/09/21. PMH: chronic thrombocytopenia, diabetes mellitus type 2, essential hypertension chronic kidney disease stage III, chronic combined systolic and diastolic heart failure iron deficiency anemia   Clinical Impression   Pt presents with decline in function and safety with ADLs and ADL mobility with impaired strength, balance and endurance. PTA pt lived at home alone and was Ind with ADLs/selfcare, light home mgt and used cane for mobility; his brother provides transportation. Pt currently requires set up/sup with UB ADLs, min guard A to sit EOB, mod A with LB ADLs and mod A with mobility using RW. Pt would benefit from acute OT services to address impairments to maximize level of function and safety    Follow Up Recommendations  SNF    Equipment Recommendations  Other (comment) (TBD at SNF)    Recommendations for Other Services       Precautions / Restrictions Precautions Precautions: Fall Restrictions Weight Bearing Restrictions: No      Mobility Bed Mobility Overal bed mobility: Needs Assistance Bed Mobility: Supine to Sit;Sit to Supine     Supine to sit: HOB elevated;Min guard Sit to supine: Min guard   General bed mobility comments: min guard for safety, increased effort and use of bed rail to pull to EoB    Transfers Overall transfer level: Needs assistance Equipment used: Rolling walker (2 wheeled) Transfers: Sit to/from Stand Sit to Stand: Mod assist;From elevated surface         General transfer comment: modA for power up from elevated bed surface    Balance Overall balance assessment: Needs assistance Sitting-balance support:  Feet supported;Feet unsupported;No upper extremity supported Sitting balance-Leahy Scale: Fair     Standing balance support: Bilateral upper extremity supported;During functional activity Standing balance-Leahy Scale: Poor                             ADL either performed or assessed with clinical judgement   ADL Overall ADL's : Needs assistance/impaired Eating/Feeding: Independent;Sitting   Grooming: Wash/dry hands;Wash/dry face;Min guard;Standing   Upper Body Bathing: Set up;Supervision/ safety;Sitting   Lower Body Bathing: Moderate assistance   Upper Body Dressing : Set up;Supervision/safety;Sitting   Lower Body Dressing: Moderate assistance   Toilet Transfer: Moderate assistance;RW;Stand-pivot   Toileting- Clothing Manipulation and Hygiene: Minimal assistance;Sit to/from stand       Functional mobility during ADLs: Moderate assistance       Vision Baseline Vision/History: Wears glasses Patient Visual Report: No change from baseline       Perception     Praxis      Pertinent Vitals/Pain Pain Assessment: 0-10 Pain Score: 3  Pain Location: bilateral feet neuropathic pain Pain Descriptors / Indicators: Pins and needles;Shooting;Tingling Pain Intervention(s): Monitored during session;Repositioned     Hand Dominance Right   Extremity/Trunk Assessment Upper Extremity Assessment Upper Extremity Assessment: Generalized weakness   Lower Extremity Assessment Lower Extremity Assessment: Defer to PT evaluation   Cervical / Trunk Assessment Cervical / Trunk Assessment: Normal   Communication Communication Communication: No difficulties   Cognition Arousal/Alertness: Awake/alert Behavior During Therapy: WFL for tasks assessed/performed Overall Cognitive Status: Within Functional Limits for tasks  assessed                                     General Comments       Exercises     Shoulder Instructions      Home Living  Family/patient expects to be discharged to:: Private residence Living Arrangements: Alone Available Help at Discharge: Family;Available PRN/intermittently Type of Home: House Home Access: Stairs to enter [employer] of Steps: 1 Entrance Stairs-Rails: Right Home Layout: One level     Bathroom Shower/Tub: Teacher, early years/pre: Standard Bathroom Accessibility: Yes   Home Equipment: Environmental consultant - 2 wheels;Cane - single point          Prior Functioning/Environment Level of Independence: Needs assistance  Gait / Transfers Assistance Needed: uses RW in home and scooter at grocery store ADL's / Homemaking Assistance Needed: indpendent with bathing and dressing, uses microwave to cool meals, manages own medication   Comments: brother provides transportation        OT Problem List: Decreased strength;Impaired balance (sitting and/or standing);Pain;Decreased activity tolerance;Decreased coordination;Decreased knowledge of use of DME or AE      OT Treatment/Interventions: Self-care/ADL training;DME and/or AE instruction;Therapeutic activities;Balance training;Therapeutic exercise;Patient/family education    OT Goals(Current goals can be found in the care plan section) Acute Rehab OT Goals Patient Stated Goal: get better, go home OT Goal Formulation: With patient Time For Goal Achievement: 03/26/21 ADL Goals Pt Will Perform Grooming: with supervision;with set-up;standing Pt Will Perform Upper Body Bathing: with set-up;sitting Pt Will Perform Lower Body Bathing: with min assist Pt Will Perform Upper Body Dressing: with set-up;sitting Pt Will Transfer to Toilet: with min assist;with min guard assist;ambulating Pt Will Perform Toileting - Clothing Manipulation and hygiene: with min guard assist;with supervision;sit to/from stand  OT Frequency: Min 2X/week   Barriers to D/C:            Co-evaluation              AM-PAC OT "6 Clicks" Daily Activity      Outcome Measure Help from another person eating meals?: None Help from another person taking care of personal grooming?: A Little Help from another person toileting, which includes using toliet, bedpan, or urinal?: A Lot Help from another person bathing (including washing, rinsing, drying)?: A Lot Help from another person to put on and taking off regular upper body clothing?: A Little Help from another person to put on and taking off regular lower body clothing?: A Lot 6 Click Score: 16   End of Session Equipment Utilized During Treatment: Gait belt;Rolling walker  Activity Tolerance: Patient limited by fatigue Patient left: in bed;with call bell/phone within reach  OT Visit Diagnosis: Unsteadiness on feet (R26.81);Other abnormalities of gait and mobility (R26.89);Pain Pain - part of body: Ankle and joints of foot                Time: 1012-1030 OT Time Calculation (min): 18 min Charges:  OT General Charges $OT Visit: 1 Visit OT Evaluation $OT Eval Moderate Complexity: 1 Mod    Britt [place] 03/12/2021, 11:18 AM

## 2021-03-12 NOTE — Progress Notes (Addendum)
[facility] PROGRESS NOTE    Progress Note  MILLEDGE GERDING  ERD:[id number] DOB: Jan 30, 1933 DOA: 03/09/2021 PCP: Reubin Milan, MD     Brief Narrative:   Scott [name] is an 87 y.o. male past medical history of chronic thrombocytopenia, diabetes mellitus type 2, essential hypertension chronic kidney disease stage III, chronic combined systolic and diastolic heart failure iron deficiency anemia comes into the hospital for shortness of breath and several days of black tarry stool.  In the ED was found to have a hemoglobin of 6 GI was consulted and admitted to the hospital.    Assessment/Plan:   Acute upper GI bleed secondary to multiple gastric ulcers: S/p  2 units of packed red blood cells GI was consulted. Ferritin of 32 iron of 24 we will go ahead and give him IV Feraheme. EGD showed multiple small antral ulcers and erosive duodenitis. Cont on PPI. Active melanotic stools overnight, continue Protonix twice daily. Transfuse 2 units of packed red blood cells, as his hemoglobin this morning was 6.1. Check a CBC posttransfusional.  Acute kidney injury on chronic kidney disease stage IIIb: Likely related to #1 after blood transfusion creatinine started to improve.  Elevated troponin's: Denies any chest pain likely demand ischemia in the setting of lower GI bleed.  Chronic respiratory failure with hypoxia: Chest x-ray showed pulmonary fibrosis will need to follow-up with pulmonary as an outpatient. Continue current home oxygen.  Chronic thrombocytopenia: Probably  contributing to his GI bleed will need to follow-up with hematology as an outpatient. Question MDS, will need biopsy as an outpatient. Platelet count today is 50,000 we will check a CBC posttransfusion all if platelets still low we will go ahead and give him a unit.  Hyperkalemia: In setting of acute kidney injury, resolved with IV fluid hydration.  Diabetes mellitus type 2: With an A1c of 6.7 with titration of  long-acting insulin plus sliding scale his blood glucose improving continue check CBGs before meals and at bedtime.  Dyslipidemia Continue statins.  Essential hypertension: Blood pressure is soft continue to hold home meds.  Chronic combined systolic and diastolic heart failure (HCC) Stable appears euvolemic with blood pressure started to improve we will go ahead and start restarting home meds.  DVT prophylaxis: SCD Family Communication:none Status is: Inpatient  Remains inpatient appropriate because:Hemodynamically unstable   Dispo: The patient is from: Home              Anticipated d/c is to: Home              Patient currently is not medically stable to d/c.   Difficult to place patient No        Code Status:     Code Status Orders  (From admission, onward)         Start     Ordered   03/10/21 0112  Do not attempt resuscitation (DNR)  Continuous       Question Answer Comment  In the event of cardiac or respiratory ARREST Do not call a "code blue"   In the event of cardiac or respiratory ARREST Do not perform Intubation, CPR, defibrillation or ACLS   In the event of cardiac or respiratory ARREST Use medication by any route, position, wound care, and other measures to relive pain and suffering. May use oxygen, suction and manual treatment of airway obstruction as needed for comfort.      03/10/21 0111        Code Status History  Date Active Date Inactive Code Status Order ID Comments User Context   03/09/2021 2136 03/10/2021 0111 Full Code [id number]  Rhetta Mura, DO ED   02/25/2020 1545 02/26/2020 1845 Full Code [id number]  Elam Dutch, MD Inpatient   01/03/2020 1409 01/09/2020 2323 Full Code [id number]  Ulyses Amor, PA-C Inpatient   10/16/2017 0159 10/18/2017 1607 Full Code [id number]  Vilma Prader, MD Inpatient   06/17/2017 1436 06/17/2017 2156 Full Code [id number]  Martinique, Peter M, MD Inpatient   05/21/2017 1557 05/22/2017 1810 Full Code [id number]   Rogelia Mire, NP Inpatient   05/12/2013 1847 05/13/2013 2058 Full Code [id number]  Liliane Shi, PA-C Inpatient   Advance Care Planning Activity        IV Access:    Peripheral IV   Procedures and diagnostic studies:   No results found.   Medical Consultants:    None.   Subjective:    [name]  had 2 episodes of melanotic stools overnight.  Denies any abdominal pain.  Objective:    Vitals:   03/12/21 0058 03/12/21 0348 03/12/21 0800 03/12/21 0830  BP:  (!) 94/41 (!) 102/54 (!) 127/47  Pulse:  (!) 53 (!) 55 (!) 55  Resp:  18 17 17   Temp:  97.9 F (36.6 C) (!) 97.5 F (36.4 C) 97.6 F (36.4 C)  TempSrc:  Oral Oral Oral  SpO2:  93% 97% 96%  Weight: 93.5 kg     Height:       SpO2: 96 % O2 Flow Rate (L/min): 1 L/min   Intake/Output Summary (Last 24 hours) at 03/12/2021 1029 Last data filed at 03/12/2021 0830 Gross per 24 hour  Intake 770 ml  Output --  Net 770 ml   Filed Weights   03/10/21 1225 03/11/21 0356 03/12/21 0058  Weight: 92.4 kg 92.9 kg 93.5 kg    Exam: General exam: In no acute distress. Respiratory system: Good air movement and clear to auscultation. Cardiovascular system: S1 & S2 heard, RRR. No JVD. Gastrointestinal system: Abdomen is soft nontender nondistended, benign. Extremities: No pedal edema. Skin: No rashes, lesions or ulcers Psychiatry: Judgement and insight appear normal. Mood & affect appropriate.   Data Reviewed:    Labs: Basic Metabolic Panel: Recent Labs  Lab 03/09/21 1621 03/10/21 1029 03/11/21 1342 03/12/21 0332  NA 134* 135 134* 136  K 5.4* 5.0 5.2* 4.7  CL 103 103 105 103  CO2 19* 22 22 23   GLUCOSE 209* 194* 315* 170*  BUN 157* 156* 145* 139*  CREATININE 3.06* 2.98* 2.93* 2.69*  CALCIUM 8.9 8.8* 8.6* 8.5*  MG  --  2.1  --   --    GFR Estimated Creatinine Clearance: 23.4 mL/min (A) (by C-G formula based on SCr of 2.69 mg/dL (H)). Liver Function Tests: Recent Labs  Lab 03/09/21 1621  03/10/21 1029  AST 20 25  ALT 17 19  ALKPHOS 73 60  BILITOT 1.1 1.3*  PROT 6.9 6.5  ALBUMIN 3.2* 3.1*   No results for input(s): LIPASE, AMYLASE in the last 168 hours. No results for input(s): AMMONIA in the last 168 hours. Coagulation profile Recent Labs  Lab 03/10/21 1029  INR 1.5*   COVID-19 Labs  Recent Labs    03/10/21 1029  FERRITIN 32    Lab Results  Component Value Date   SARSCOV2NAA NEGATIVE 03/09/2021   SARSCOV2NAA NEGATIVE 10/02/2020   [place] NEGATIVE 02/21/2020    CBC: Recent Labs  Lab 03/09/21 1621 03/10/21 1029  03/12/21 0332  WBC 7.6 7.1 5.8  NEUTROABS 5.3  --   --   HGB 6.1* 7.9* 6.1*  HCT 20.8* 25.5* 19.7*  MCV 101.5* 95.1 95.2  PLT 86* 73* 50*   Cardiac Enzymes: No results for input(s): CKTOTAL, CKMB, CKMBINDEX, TROPONINI in the last 168 hours. BNP (last 3 results) No results for input(s): PROBNP in the last 8760 hours. CBG: Recent Labs  Lab 03/11/21 0618 03/11/21 1104 03/11/21 1609 03/11/21 2141 03/12/21 0620  GLUCAP 202* 259* 306* 247* 145*   D-Dimer: No results for input(s): DDIMER in the last 72 hours. Hgb A1c: Recent Labs    03/11/21 1342  HGBA1C 6.7*   Lipid Profile: No results for input(s): CHOL, HDL, LDLCALC, TRIG, CHOLHDL, LDLDIRECT in the last 72 hours. Thyroid function studies: Recent Labs    03/10/21 1029  TSH 3.108   Anemia work up: Recent Labs    03/10/21 1029  FOLATE 38.0  FERRITIN 32  TIBC 406  IRON 24*  RETICCTPCT 4.6*   Sepsis Labs: Recent Labs  Lab 03/09/21 1621 03/10/21 1029 03/12/21 0332  WBC 7.6 7.1 5.8   Microbiology Recent Results (from the past 240 hour(s))  Resp Panel by RT-PCR (Flu A&B, Covid) Nasopharyngeal Swab     Status: None   Collection Time: 03/09/21  9:38 PM   Specimen: Nasopharyngeal Swab; Nasopharyngeal(NP) swabs in vial transport medium  Result Value Ref Range Status   SARS Coronavirus 2 by RT PCR NEGATIVE NEGATIVE Final    Comment: (NOTE) SARS-CoV-2 target  nucleic acids are NOT DETECTED.  The SARS-CoV-2 RNA is generally detectable in upper respiratory specimens during the acute phase of infection. The lowest concentration of SARS-CoV-2 viral copies this assay can detect is 138 copies/mL. A negative result does not preclude SARS-Cov-2 infection and should not be used as the sole basis for treatment or other patient management decisions. A negative result may occur with  improper specimen collection/handling, submission of specimen other than nasopharyngeal swab, presence of viral mutation(s) within the areas targeted by this assay, and inadequate number of viral copies(<138 copies/mL). A negative result must be combined with clinical observations, patient history, and epidemiological information. The expected result is Negative.  Fact Sheet for Patients:  EntrepreneurPulse.com.au  Fact Sheet for Healthcare Providers:  IncredibleEmployment.be  This test is no t yet approved or cleared by the [place] FDA and  has been authorized for detection and/or diagnosis of SARS-CoV-2 by FDA under an Emergency Use Authorization (EUA). This EUA will remain  in effect (meaning this test can be used) for the duration of the COVID-19 declaration under Section 564(b)(1) of the Act, 21 U.S.C.section 360bbb-3(b)(1), unless the authorization is terminated  or revoked sooner.       Influenza A by PCR NEGATIVE NEGATIVE Final   Influenza B by PCR NEGATIVE NEGATIVE Final    Comment: (NOTE) The Xpert Xpress SARS-CoV-2/FLU/RSV plus assay is intended as an aid in the diagnosis of influenza from Nasopharyngeal swab specimens and should not be used as a sole basis for treatment. Nasal washings and aspirates are unacceptable for Xpert Xpress SARS-CoV-2/FLU/RSV testing.  Fact Sheet for Patients: EntrepreneurPulse.com.au  Fact Sheet for Healthcare  Providers: IncredibleEmployment.be  This test is not yet approved or cleared by the [place] FDA and has been authorized for detection and/or diagnosis of SARS-CoV-2 by FDA under an Emergency Use Authorization (EUA). This EUA will remain in effect (meaning this test can be used) for the duration of the COVID-19 declaration under Section 564(b)(1)  of the Act, 21 U.S.C. section 360bbb-3(b)(1), unless the authorization is terminated or revoked.  Performed at [facility], [address] [place], [place] [address]      Medications:   . atorvastatin  20 mg Oral QHS  . carvedilol  25 mg Oral BID  . insulin aspart  0-5 Units Subcutaneous QHS  . insulin aspart  0-9 Units Subcutaneous TID WC  . insulin aspart  3 Units Subcutaneous TID WC  . insulin detemir  10 Units Subcutaneous BID  . latanoprost  1 drop Both Eyes QHS  . pantoprazole  40 mg Oral BID  . tamsulosin  0.4 mg Oral QHS   Continuous Infusions:     LOS: 2 days   Charlynne Cousins  [facility]  03/12/2021, 10:29 AM

## 2021-03-12 NOTE — Progress Notes (Signed)
HOSPITAL MEDICINE OVERNIGHT EVENT NOTE    Notified by nursing the patient has had a substantial drop in hemoglobin from 7.9 on 5/31-6.1 this morning.  Nursing reports the patient is continuing to experience episodes of melena, with 2 episodes of melena during the shift.  Patient denies shortness of breath, denies chest pain.  Patient is currently hemodynamically stable.  Chart reviewed, patient apparently has multiple gastric ulcers that are likely the source of the patient's ongoing bleeding.  Patient is currently receiving PPI and recently had his diet advanced.  Ordering 2 units of packed red blood cells to be transfused over the next several hours with posttransfusion H&H.  Due to ongoing bleeding will hold diet for now and make patient n.p.o. once again until patient is cleared to advance diet by the day provider/GI.  Vernelle Emerald  MD [facility]

## 2021-03-12 NOTE — TOC Progression Note (Addendum)
Transition of Care [facility]) - Progression Note    Patient Details  Name: [name] MRN: [id number] Date of Birth: [birth year]  [facility] CM/SW Contact  Reece Agar, [place] Phone Number: 03/12/2021, 9:06 AM  Clinical Narrative:    [phone]: CSW spoke with pt brother who stated he and pt looked over medicare.gov list and wants to try [facility] or [facility], CSW will contact [facility] as first choice.  0940: CSW contacted [facility], no answer or VM space available CSW will follow up.   1328: CSW contacted Janie at [facility], she states that she does not know if she will have any beds until tomorrow morning and to follow up then.   1400: [facility] will take pt but cannot take over the weekend pt can DC there either tomorrow or Monday.      Expected Discharge Plan and Services                                                 Social Determinants of Health (SDOH) Interventions    Readmission Risk Interventions Readmission Risk Prevention Plan 01/09/2020  Transportation Screening Complete  PCP or Specialist Appt within 5-7 Days Complete  Home Care Screening Complete  Medication Review (RN CM) Complete  Some recent data might be hidden

## 2021-03-12 NOTE — TOC Progression Note (Signed)
Transition of Care [facility]) - Progression Note    Patient Details  Name: COLETON WOON MRN: [id number] Date of Birth: 27-Nov-1932  Transition of Care [facility]) CM/SW [place], [place] Phone Number: 03/12/2021, 4:19 PM  Clinical Narrative:     CSW attempted to start auth. Informed by Everlene Balls that they would have to submit an "eligibility check" to determine if pt can be managed by [employer]. They will call CSW and notify if pt can be managed by [employer]. In the mean time CSW is informed that Josem Kaufmann would have to be started through [facility] directly. This is something the facility would start. CSW contacted [facility] and is informed that admissions liaison has left for the day. CSW left message with staff person to notify liaison.        Expected Discharge Plan and Services                                                 Social Determinants of Health (SDOH) Interventions    Readmission Risk Interventions Readmission Risk Prevention Plan 01/09/2020  Transportation Screening Complete  PCP or Specialist Appt within 5-7 Days Complete  Home Care Screening Complete  Medication Review (RN CM) Complete  Some recent data might be hidden

## 2021-03-12 NOTE — Progress Notes (Signed)
03/12/21 6:06 Hgh today was 6.1. had 2 Black bowel movement on night shift. Hospitalist was Paged

## 2021-03-13 LAB — TYPE AND SCREEN
ABO/RH(D): O POS
Antibody Screen: NEGATIVE
Unit division: 0
Unit division: 0
Unit division: 0
Unit division: 0

## 2021-03-13 LAB — CBC
HCT: 26.2 % — ABNORMAL LOW (ref 39.0–52.0)
Hemoglobin: 8 g/dL — ABNORMAL LOW (ref 13.0–17.0)
MCH: 29.2 pg (ref 26.0–34.0)
MCHC: 30.5 g/dL (ref 30.0–36.0)
MCV: 95.6 fL (ref 80.0–100.0)
Platelets: 45 10*3/uL — ABNORMAL LOW (ref 150–400)
RBC: 2.74 MIL/uL — ABNORMAL LOW (ref 4.22–5.81)
RDW: 18 % — ABNORMAL HIGH (ref 11.5–15.5)
WBC: 6 10*3/uL (ref 4.0–10.5)
nRBC: 0 % (ref 0.0–0.2)

## 2021-03-13 LAB — BPAM RBC
Blood Product Expiration Date: 202206292359
Blood Product Expiration Date: 202206292359
Blood Product Expiration Date: 202206302359
Blood Product Expiration Date: 202207062359
ISSUE DATE / TIME: 202205302221
ISSUE DATE / TIME: 202205310050
ISSUE DATE / TIME: 202206020749
ISSUE DATE / TIME: 202206021115
Unit Type and Rh: 5100
Unit Type and Rh: 5100
Unit Type and Rh: 5100
Unit Type and Rh: 5100

## 2021-03-13 LAB — GLUCOSE, CAPILLARY
Glucose-Capillary: 47 mg/dL — ABNORMAL LOW (ref 70–99)
Glucose-Capillary: 110 mg/dL — ABNORMAL HIGH (ref 70–99)
Glucose-Capillary: 92 mg/dL (ref 70–99)
Glucose-Capillary: 201 mg/dL — ABNORMAL HIGH (ref 70–99)
Glucose-Capillary: 156 mg/dL — ABNORMAL HIGH (ref 70–99)

## 2021-03-13 MED ORDER — INSULIN DETEMIR 100 UNIT/ML ~~LOC~~ SOLN
5.0000 [IU] | Freq: Two times a day (BID) | SUBCUTANEOUS | Status: DC
  Administered 2021-03-13 – 2021-03-16 (×6): 5 [IU] via SUBCUTANEOUS
  Filled 2021-03-13 (×7): qty 0.05

## 2021-03-13 MED ORDER — DEXTROSE 50 % IV SOLN
25.0000 g | INTRAVENOUS | Status: AC
  Administered 2021-03-13: 25 g via INTRAVENOUS

## 2021-03-13 MED ORDER — DEXTROSE 50 % IV SOLN
INTRAVENOUS | Status: AC
  Filled 2021-03-13: qty 50

## 2021-03-13 NOTE — Progress Notes (Signed)
Pt HR dropped in to 30s. Nonsustained. MD notified. Carvedilol held per parameters.

## 2021-03-13 NOTE — Plan of Care (Signed)

## 2021-03-13 NOTE — Progress Notes (Addendum)
[facility] PROGRESS NOTE    Progress Note  Scott [name]  TIR:[id number] DOB: December 05, 1932 DOA: 03/09/2021 PCP: Reubin Milan, MD     Brief Narrative:   Scott [name] is an 87 y.o. male past medical history of chronic thrombocytopenia, diabetes mellitus type 2, essential hypertension chronic kidney disease stage III, chronic combined systolic and diastolic heart failure iron deficiency anemia comes into the hospital for shortness of breath and several days of black tarry stool.  In the ED was found to have a hemoglobin of 6 GI was consulted and admitted to the hospital.    Assessment/Plan:   Acute upper GI bleed secondary to multiple gastric ulcers: Ferritin of 32 iron of 24 status post IV Feraheme. EGD showed multiple small antral ulcers and erosive duodenitis. Cont on PPI. Had active melanotic stools overnight on 03/11/2021.  Currently on Protonix p.o. twice daily. No further bloody bowel movements. Status post 4 units of packed red blood cells, his hemoglobin is 7.7, CBC this morning is pending. Monitor for an additional 24 hours. Physical therapy eval patient recommended skilled nursing facility.  Acute kidney injury on chronic kidney disease stage IIIb: Likely related to #1 after blood transfusion creatinine started to improve.  Elevated troponin's: Denies any chest pain likely demand ischemia in the setting of lower GI bleed.  Chronic respiratory failure with hypoxia: Chest x-ray showed pulmonary fibrosis will need to follow-up with pulmonary as an outpatient. Continue current home oxygen.  Chronic thrombocytopenia: Probably  contributing to his GI bleed will need to follow-up with hematology as an outpatient. Question MDS, will need biopsy as an outpatient. Platelet count today is 50,000 we will check a CBC posttransfusion all if platelets still low we will go ahead and give him a unit.  Hyperkalemia: In setting of acute kidney injury, resolved with IV fluid  hydration.  Diabetes mellitus type 2: With an A1c of 6.7 with titration of long-acting insulin plus sliding scale his blood glucose improving continue check CBGs before meals and at bedtime.  Dyslipidemia: Continue statins.  Essential hypertension: Blood pressure is soft continue to hold home meds.  Chronic combined systolic and diastolic heart failure (HCC) Stable appears euvolemic with blood pressure started to improve we will go ahead and start restarting home meds.  DVT prophylaxis: SCD Family Communication:none Status is: Inpatient  Remains inpatient appropriate because:Hemodynamically unstable   Dispo: The patient is from: Home              Anticipated d/c is to: Home              Patient currently is not medically stable to d/c.   Difficult to place patient No        Code Status:     Code Status Orders  (From admission, onward)         Start     Ordered   03/10/21 0112  Do not attempt resuscitation (DNR)  Continuous       Question Answer Comment  In the event of cardiac or respiratory ARREST Do not call a "code blue"   In the event of cardiac or respiratory ARREST Do not perform Intubation, CPR, defibrillation or ACLS   In the event of cardiac or respiratory ARREST Use medication by any route, position, wound care, and other measures to relive pain and suffering. May use oxygen, suction and manual treatment of airway obstruction as needed for comfort.      03/10/21 0111  Code Status History    Date Active Date Inactive Code Status Order ID Comments User Context   03/09/2021 2136 03/10/2021 0111 Full Code [id number]  Rhetta Mura, DO ED   02/25/2020 1545 02/26/2020 1845 Full Code [id number]  Elam Dutch, MD Inpatient   01/03/2020 1409 01/09/2020 2323 Full Code [id number]  Ulyses Amor, PA-C Inpatient   10/16/2017 0159 10/18/2017 1607 Full Code [id number]  Vilma Prader, MD Inpatient   06/17/2017 1436 06/17/2017 2156 Full Code [id number]  [place],  Peter M, MD Inpatient   05/21/2017 1557 05/22/2017 1810 Full Code [id number]  Rogelia Mire, NP Inpatient   05/12/2013 1847 05/13/2013 2058 Full Code [id number]  Liliane Shi, PA-C Inpatient   Advance Care Planning Activity        IV Access:    Peripheral IV   Procedures and diagnostic studies:   No results found.   Medical Consultants:    None.   Subjective:    Scott [name] denies abdominal pain no signs of overt bleeding overnight no melanotic stools.  Objective:    Vitals:   03/13/21 0522 03/13/21 0530 03/13/21 0720 03/13/21 0902  BP: (!) 121/58   (!) 111/52  Pulse: (!) 48  (!) 51 (!) 51  Resp: 18     Temp: (!) 97.5 F (36.4 C)     TempSrc: Oral     SpO2: 100%   99%  Weight:  93 kg    Height:       SpO2: 99 % O2 Flow Rate (L/min): 1 L/min   Intake/Output Summary (Last 24 hours) at 03/13/2021 1016 Last data filed at 03/13/2021 0620 Gross per 24 hour  Intake 1363.75 ml  Output 1250 ml  Net 113.75 ml   Filed Weights   03/11/21 0356 03/12/21 0058 03/13/21 0530  Weight: 92.9 kg 93.5 kg 93 kg    Exam: General exam: In no acute distress. Respiratory system: Good air movement and clear to auscultation. Cardiovascular system: S1 & S2 heard, RRR. No JVD. Gastrointestinal system: Abdomen is nondistended, soft and nontender.  Extremities: No pedal edema. Skin: No rashes, lesions or ulcers Psychiatry: Judgement and insight appear normal. Mood & affect appropriate.   Data Reviewed:    Labs: Basic Metabolic Panel: Recent Labs  Lab 03/09/21 1621 03/10/21 1029 03/11/21 1342 03/12/21 0332  NA 134* 135 134* 136  K 5.4* 5.0 5.2* 4.7  CL 103 103 105 103  CO2 19* 22 22 23   GLUCOSE 209* 194* 315* 170*  BUN 157* 156* 145* 139*  CREATININE 3.06* 2.98* 2.93* 2.69*  CALCIUM 8.9 8.8* 8.6* 8.5*  MG  --  2.1  --   --    GFR Estimated Creatinine Clearance: 21.6 mL/min (A) (by C-G formula based on SCr of 2.69 mg/dL (H)). Liver Function Tests: Recent  Labs  Lab 03/09/21 1621 03/10/21 1029  AST 20 25  ALT 17 19  ALKPHOS 73 60  BILITOT 1.1 1.3*  PROT 6.9 6.5  ALBUMIN 3.2* 3.1*   No results for input(s): LIPASE, AMYLASE in the last 168 hours. No results for input(s): AMMONIA in the last 168 hours. Coagulation profile Recent Labs  Lab 03/10/21 1029  INR 1.5*   COVID-19 Labs  Recent Labs    03/10/21 1029  FERRITIN 32    Lab Results  Component Value Date   SARSCOV2NAA NEGATIVE 03/09/2021   SARSCOV2NAA NEGATIVE 10/02/2020   [place] NEGATIVE 02/21/2020    CBC: Recent Labs  Lab 03/09/21 1621 03/10/21 1029  03/12/21 0332 03/12/21 1546  WBC 7.6 7.1 5.8 5.7  NEUTROABS 5.3  --   --   --   HGB 6.1* 7.9* 6.1* 7.7*  HCT 20.8* 25.5* 19.7* 24.5*  MCV 101.5* 95.1 95.2 92.8  PLT 86* 73* 50* 50*   Cardiac Enzymes: No results for input(s): CKTOTAL, CKMB, CKMBINDEX, TROPONINI in the last 168 hours. BNP (last 3 results) No results for input(s): PROBNP in the last 8760 hours. CBG: Recent Labs  Lab 03/12/21 1131 03/12/21 1616 03/12/21 2146 03/13/21 0619 03/13/21 0651  GLUCAP 129* 118* 99 47* 110*   D-Dimer: No results for input(s): DDIMER in the last 72 hours. Hgb A1c: Recent Labs    03/11/21 1342  HGBA1C 6.7*   Lipid Profile: No results for input(s): CHOL, HDL, LDLCALC, TRIG, CHOLHDL, LDLDIRECT in the last 72 hours. Thyroid function studies: Recent Labs    03/10/21 1029  TSH 3.108   Anemia work up: Recent Labs    03/10/21 1029  FOLATE 38.0  FERRITIN 32  TIBC 406  IRON 24*  RETICCTPCT 4.6*   Sepsis Labs: Recent Labs  Lab 03/09/21 1621 03/10/21 1029 03/12/21 0332 03/12/21 1546  WBC 7.6 7.1 5.8 5.7   Microbiology Recent Results (from the past 240 hour(s))  Resp Panel by RT-PCR (Flu A&B, Covid) Nasopharyngeal Swab     Status: None   Collection Time: 03/09/21  9:38 PM   Specimen: Nasopharyngeal Swab; Nasopharyngeal(NP) swabs in vial transport medium  Result Value Ref Range Status   SARS  Coronavirus 2 by RT PCR NEGATIVE NEGATIVE Final    Comment: (NOTE) SARS-CoV-2 target nucleic acids are NOT DETECTED.  The SARS-CoV-2 RNA is generally detectable in upper respiratory specimens during the acute phase of infection. The lowest concentration of SARS-CoV-2 viral copies this assay can detect is 138 copies/mL. A negative result does not preclude SARS-Cov-2 infection and should not be used as the sole basis for treatment or other patient management decisions. A negative result may occur with  improper specimen collection/handling, submission of specimen other than nasopharyngeal swab, presence of viral mutation(s) within the areas targeted by this assay, and inadequate number of viral copies(<138 copies/mL). A negative result must be combined with clinical observations, patient history, and epidemiological information. The expected result is Negative.  Fact Sheet for Patients:  EntrepreneurPulse.com.au  Fact Sheet for Healthcare Providers:  IncredibleEmployment.be  This test is no t yet approved or cleared by the [place] FDA and  has been authorized for detection and/or diagnosis of SARS-CoV-2 by FDA under an Emergency Use Authorization (EUA). This EUA will remain  in effect (meaning this test can be used) for the duration of the COVID-19 declaration under Section 564(b)(1) of the Act, 21 U.S.C.section 360bbb-3(b)(1), unless the authorization is terminated  or revoked sooner.       Influenza A by PCR NEGATIVE NEGATIVE Final   Influenza B by PCR NEGATIVE NEGATIVE Final    Comment: (NOTE) The Xpert Xpress SARS-CoV-2/FLU/RSV plus assay is intended as an aid in the diagnosis of influenza from Nasopharyngeal swab specimens and should not be used as a sole basis for treatment. Nasal washings and aspirates are unacceptable for Xpert Xpress SARS-CoV-2/FLU/RSV testing.  Fact Sheet for  Patients: EntrepreneurPulse.com.au  Fact Sheet for Healthcare Providers: IncredibleEmployment.be  This test is not yet approved or cleared by the [place] FDA and has been authorized for detection and/or diagnosis of SARS-CoV-2 by FDA under an Emergency Use Authorization (EUA). This EUA will remain in effect (meaning this test  can be used) for the duration of the COVID-19 declaration under Section 564(b)(1) of the Act, 21 U.S.C. section 360bbb-3(b)(1), unless the authorization is terminated or revoked.  Performed at [facility], [place] [address], [place], [place] [address]      Medications:   . atorvastatin  20 mg Oral QHS  . carvedilol  25 mg Oral BID  . insulin aspart  0-5 Units Subcutaneous QHS  . insulin aspart  0-9 Units Subcutaneous TID WC  . insulin aspart  3 Units Subcutaneous TID WC  . insulin detemir  10 Units Subcutaneous BID  . latanoprost  1 drop Both Eyes QHS  . pantoprazole  40 mg Oral BID  . tamsulosin  0.4 mg Oral QHS   Continuous Infusions:     LOS: 3 days   Charlynne Cousins  [facility]  03/13/2021, 10:16 AM

## 2021-03-13 NOTE — Plan of Care (Signed)
  Problem: Clinical Measurements: Goal: Respiratory complications will improve Outcome: Progressing   

## 2021-03-13 NOTE — TOC Progression Note (Addendum)
Transition of Care [facility]) - Progression Note    Patient Details  Name: Scott [name] MRN: [id number] Date of Birth: [birth year]  [facility] CM/SW Contact  Reece Agar, [place] Phone Number: 03/13/2021, 3:13 PM  Clinical Narrative:    3212: CSW spoke with [place] with [facility] to confirm [facility] has been started, she ensured [facility] was started. 1512: CSW spoke with [place] at [facility], she stated they do not accept weekend admissions pt can come on Monday. COVID is needed 24-48 hours prior to DC. CSW following.       Expected Discharge Plan and Services                                                 Social Determinants of Health (SDOH) Interventions    Readmission Risk Interventions Readmission Risk Prevention Plan 01/09/2020  Transportation Screening Complete  PCP or Specialist Appt within 5-7 Days Complete  Home Care Screening Complete  Medication Review (RN CM) Complete  Some recent data might be hidden

## 2021-03-13 NOTE — Progress Notes (Signed)
Physical Therapy Treatment Patient Details Name: Scott [name] MRN: [id number] DOB: Jun 22, 1933 Today's Date: 03/13/2021    History of Present Illness 87 y.o. male comes into the hospital for shortness of breath and several days of black tarry stool.  In the ED was found to have a hemoglobin of 6 GI was consulted and admitted 03/09/21. s/p multiple blood transfusions. PMH: chronic thrombocytopenia, diabetes mellitus type 2, essential hypertension chronic kidney disease stage III, chronic combined systolic and diastolic heart failure iron deficiency anemia    PT Comments    Patient progressing well towards PT goals. Tolerated gait training today with Min guard assist and use of RW for support. Noted to have extremely slow gait speed with exaggerated hip flexion to progress LEs with narrow BoS due to impaired proprioception/sensation of BLEs. 2/4 DOE noted but VSS on RA. Requires Mod A to stand from chair due to weakness in BLEs and noted to have posterior bias midway up. Highly motivated to regain functional independence and agreeable to SNF to maximize independence and mobility prior to return home. Will follow.   Follow Up Recommendations  SNF     Equipment Recommendations  3in1 (PT)    Recommendations for Other Services       Precautions / Restrictions Precautions Precautions: Fall Restrictions Weight Bearing Restrictions: No    Mobility  Bed Mobility               General bed mobility comments: Sitting in chair upon PT arrival.    Transfers Overall transfer level: Needs assistance Equipment used: Rolling walker (2 wheeled) Transfers: Sit to/from Stand Sit to Stand: Mod assist         General transfer comment: Assist to power to standing with cues for anterior weight shift, posterior bias mid way up to standing.  Ambulation/Gait Ambulation/Gait assistance: Min guard Gait Distance (Feet): 110 Feet Assistive device: Rolling walker (2 wheeled) Gait  Pattern/deviations: Decreased step length - right;Decreased step length - left;Trunk flexed;Step-through pattern   Gait velocity interpretation: <1.31 ft/sec, indicative of household ambulator General Gait Details: Slow, mildly unsteady gait with narrow BoS and exaggerated hip flexion to progress LEs due to hx of neuropathy and impaired sensation. Very slow. 2/4 DOE noted.   Stairs             Wheelchair Mobility    Modified Rankin (Stroke Patients Only)       Balance Overall balance assessment: Needs assistance Sitting-balance support: Feet supported;No upper extremity supported Sitting balance-Leahy Scale: Fair Sitting balance - Comments: supervision   Standing balance support: During functional activity Standing balance-Leahy Scale: Poor Standing balance comment: requires UE support                            Cognition Arousal/Alertness: Awake/alert Behavior During Therapy: WFL for tasks assessed/performed Overall Cognitive Status: Within Functional Limits for tasks assessed                                        Exercises      General Comments General comments (skin integrity, edema, etc.): Brother and sister in law present in middle of session; VSS on RA. 2/4 DOE noted.      Pertinent Vitals/Pain Pain Assessment: Faces Faces Pain Scale: Hurts even more Pain Location: bilateral feet neuropathic pain Pain Descriptors / Indicators: Pins and needles;Shooting;Tingling Pain Intervention(s): Monitored  during session;Repositioned    Home Living                      Prior Function            PT Goals (current goals can now be found in the care plan section) Progress towards PT goals: Progressing toward goals    Frequency    Min 2X/week      PT Plan Current plan remains appropriate    Co-evaluation              AM-PAC PT "6 Clicks" Mobility   Outcome Measure  Help needed turning from your back to your  side while in a flat bed without using bedrails?: None Help needed moving from lying on your back to sitting on the side of a flat bed without using bedrails?: A Little Help needed moving to and from a bed to a chair (including a wheelchair)?: A Lot Help needed standing up from a chair using your arms (e.g., wheelchair or bedside chair)?: A Lot Help needed to walk in hospital room?: A Little Help needed climbing 3-5 steps with a railing? : A Lot 6 Click Score: 16    End of Session Equipment Utilized During Treatment: Gait belt Activity Tolerance: Patient tolerated treatment well Patient left: in chair;with call bell/phone within reach;with chair alarm set;with family/visitor present Nurse Communication: Mobility status PT Visit Diagnosis: Unsteadiness on feet (R26.81);Other abnormalities of gait and mobility (R26.89);Muscle weakness (generalized) (M62.81);Difficulty in walking, not elsewhere classified (R26.2);Other symptoms and signs involving the nervous system (R29.898);Pain Pain - Right/Left:  (bil) Pain - part of body: Ankle and joints of foot     Time: 0175-1025 PT Time Calculation (min) (ACUTE ONLY): 29 min  Charges:  $Gait Training: 23-37 mins                     Marisa Severin, PT, DPT Acute Rehabilitation Services Pager [phone] Office [place] 03/13/2021, 12:55 PM

## 2021-03-14 LAB — METHYLMALONIC ACID, SERUM: Methylmalonic Acid, Quantitative: 456 nmol/L — ABNORMAL HIGH (ref 0–378)

## 2021-03-14 LAB — GLUCOSE, CAPILLARY
Glucose-Capillary: 123 mg/dL — ABNORMAL HIGH (ref 70–99)
Glucose-Capillary: 162 mg/dL — ABNORMAL HIGH (ref 70–99)
Glucose-Capillary: 195 mg/dL — ABNORMAL HIGH (ref 70–99)
Glucose-Capillary: 170 mg/dL — ABNORMAL HIGH (ref 70–99)

## 2021-03-14 LAB — CBC
HCT: 26.1 % — ABNORMAL LOW (ref 39.0–52.0)
Hemoglobin: 8 g/dL — ABNORMAL LOW (ref 13.0–17.0)
MCH: 29 pg (ref 26.0–34.0)
MCHC: 30.7 g/dL (ref 30.0–36.0)
MCV: 94.6 fL (ref 80.0–100.0)
Platelets: 56 10*3/uL — ABNORMAL LOW (ref 150–400)
RBC: 2.76 MIL/uL — ABNORMAL LOW (ref 4.22–5.81)
RDW: 17.9 % — ABNORMAL HIGH (ref 11.5–15.5)
WBC: 6.6 10*3/uL (ref 4.0–10.5)
nRBC: 0.3 % — ABNORMAL HIGH (ref 0.0–0.2)

## 2021-03-14 MED ORDER — ALBUTEROL SULFATE (2.5 MG/3ML) 0.083% IN NEBU
2.5000 mg | INHALATION_SOLUTION | RESPIRATORY_TRACT | Status: DC | PRN
  Administered 2021-03-14: 2.5 mg via RESPIRATORY_TRACT
  Filled 2021-03-14: qty 3

## 2021-03-14 NOTE — Plan of Care (Signed)

## 2021-03-14 NOTE — Progress Notes (Signed)
Rt placed patient on CPAP HS. 3L O2 bleed in needed. Patient tolerating well.

## 2021-03-14 NOTE — Progress Notes (Signed)
[facility] PROGRESS NOTE    Progress Note  Scott [name]  ZJI:[id number] DOB: June 29, 1933 DOA: 03/09/2021 PCP: Reubin Milan, MD     Brief Narrative:   Scott [name] is an 87 y.o. male past medical history of chronic thrombocytopenia, diabetes mellitus type 2, essential hypertension chronic kidney disease stage III, chronic combined systolic and diastolic heart failure iron deficiency anemia comes into the hospital for shortness of breath and several days of black tarry stool.  In the ED was found to have a hemoglobin of 6 GI was consulted and admitted to the hospital.  Awaiting skilled nursing facility placement.  Assessment/Plan:   Acute upper GI bleed secondary to multiple gastric ulcers: Status post IV Feraheme EGD showed multiple small antral ulcers and erosive duodenitis. Cont on PPI. Had active melanotic stools overnight on 03/11/2021.  Currently on Protonix p.o. twice daily. Status post 4 units of packed red blood cells, hemoglobin is stable at 8. Awaiting skilled nursing facility placement Physical therapy eval patient recommended skilled nursing facility. No changes overnight.  Acute kidney injury on chronic kidney disease stage IIIb: Likely related to #1 after blood transfusion creatinine started to improve.  Elevated troponin's: Denies any chest pain likely demand ischemia in the setting of lower GI bleed.  Chronic respiratory failure with hypoxia: Chest x-ray showed pulmonary fibrosis will need to follow-up with pulmonary as an outpatient. Continue current home oxygen.  Chronic thrombocytopenia: Probably  contributing to his GI bleed will need to follow-up with hematology as an outpatient. Question MDS, will need biopsy as an outpatient. Platelet count today is 50,000 we will check a CBC posttransfusion all if platelets still low we will go ahead and give him a unit.  Hyperkalemia: In setting of acute kidney injury, resolved with IV fluid  hydration.  Diabetes mellitus type 2: With an A1c of 6.7 with titration of long-acting insulin plus sliding scale his blood glucose improving continue check CBGs before meals and at bedtime.  Dyslipidemia: Continue statins.  Essential hypertension: Blood pressure is soft continue to hold home meds.  Chronic combined systolic and diastolic heart failure (HCC) Stable appears euvolemic with blood pressure started to improve we will go ahead and start restarting home meds.  DVT prophylaxis: SCD Family Communication:none Status is: Inpatient  Remains inpatient appropriate because:Hemodynamically unstable   Dispo: The patient is from: Home              Anticipated d/c is to: Home              Patient currently is not medically stable to d/c.   Difficult to place patient No        Code Status:     Code Status Orders  (From admission, onward)         Start     Ordered   03/10/21 0112  Do not attempt resuscitation (DNR)  Continuous       Question Answer Comment  In the event of cardiac or respiratory ARREST Do not call a "code blue"   In the event of cardiac or respiratory ARREST Do not perform Intubation, CPR, defibrillation or ACLS   In the event of cardiac or respiratory ARREST Use medication by any route, position, wound care, and other measures to relive pain and suffering. May use oxygen, suction and manual treatment of airway obstruction as needed for comfort.      03/10/21 0111        Code Status History    Date Active  Date Inactive Code Status Order ID Comments User Context   03/09/2021 2136 03/10/2021 0111 Full Code [id number]  Rhetta Mura DO ED   02/25/2020 1545 02/26/2020 1845 Full Code [id number]  Elam Dutch, MD Inpatient   01/03/2020 1409 01/09/2020 2323 Full Code [id number]  Ulyses Amor, PA-C Inpatient   10/16/2017 0159 10/18/2017 1607 Full Code [id number]  Vilma Prader, MD Inpatient   06/17/2017 1436 06/17/2017 2156 Full Code [id number]  [place],  Peter M, MD Inpatient   05/21/2017 1557 05/22/2017 1810 Full Code [id number]  Rogelia Mire, NP Inpatient   05/12/2013 1847 05/13/2013 2058 Full Code [id number]  Liliane Shi, PA-C Inpatient   Advance Care Planning Activity        IV Access:    Peripheral IV   Procedures and diagnostic studies:   No results found.   Medical Consultants:    None.   Subjective:    Scott [name] no melanotic stools.  Objective:    Vitals:   03/14/21 0003 03/14/21 0045 03/14/21 0335 03/14/21 0824  BP: 110/66  121/75 (!) 115/51  Pulse: (!) 58  60 (!) 59  Resp: 18  18 18   Temp: 98.1 F (36.7 C)  98.4 F (36.9 C) 97.7 F (36.5 C)  TempSrc: Oral  Oral Oral  SpO2: 99%  100% 97%  Weight:  93.5 kg    Height:       SpO2: 97 % O2 Flow Rate (L/min): 3 L/min   Intake/Output Summary (Last 24 hours) at 03/14/2021 0956 Last data filed at 03/14/2021 0340 Gross per 24 hour  Intake 960 ml  Output 1140 ml  Net -180 ml   Filed Weights   03/12/21 0058 03/13/21 0530 03/14/21 0045  Weight: 93.5 kg 93 kg 93.5 kg    Exam: General exam: In no acute distress. Respiratory system: Good air movement and clear to auscultation. Cardiovascular system: S1 & S2 heard, RRR. No JVD. Gastrointestinal system: Abdomen is nondistended, soft and nontender.  Extremities: No pedal edema. Skin: No rashes, lesions or ulcers Psychiatry: Judgement and insight appear normal. Mood & affect appropriate.   Data Reviewed:    Labs: Basic Metabolic Panel: Recent Labs  Lab 03/09/21 1621 03/10/21 1029 03/11/21 1342 03/12/21 0332  NA 134* 135 134* 136  K 5.4* 5.0 5.2* 4.7  CL 103 103 105 103  CO2 19* 22 22 23   GLUCOSE 209* 194* 315* 170*  BUN 157* 156* 145* 139*  CREATININE 3.06* 2.98* 2.93* 2.69*  CALCIUM 8.9 8.8* 8.6* 8.5*  MG  --  2.1  --   --    GFR Estimated Creatinine Clearance: 23.4 mL/min (A) (by C-G formula based on SCr of 2.69 mg/dL (H)). Liver Function Tests: Recent Labs  Lab  03/09/21 1621 03/10/21 1029  AST 20 25  ALT 17 19  ALKPHOS 73 60  BILITOT 1.1 1.3*  PROT 6.9 6.5  ALBUMIN 3.2* 3.1*   No results for input(s): LIPASE, AMYLASE in the last 168 hours. No results for input(s): AMMONIA in the last 168 hours. Coagulation profile Recent Labs  Lab 03/10/21 1029  INR 1.5*   COVID-19 Labs  No results for input(s): DDIMER, FERRITIN, LDH, CRP in the last 72 hours.  Lab Results  Component Value Date   SARSCOV2NAA NEGATIVE 03/09/2021   [place] NEGATIVE 10/02/2020   [place] NEGATIVE 02/21/2020    CBC: Recent Labs  Lab 03/09/21 1621 03/10/21 1029 03/12/21 0332 03/12/21 1546 03/13/21 0905 03/14/21 0241  WBC 7.6 7.1 5.8  5.7 6.0 6.6  NEUTROABS 5.3  --   --   --   --   --   HGB 6.1* 7.9* 6.1* 7.7* 8.0* 8.0*  HCT 20.8* 25.5* 19.7* 24.5* 26.2* 26.1*  MCV 101.5* 95.1 95.2 92.8 95.6 94.6  PLT 86* 73* 50* 50* 45* 56*   Cardiac Enzymes: No results for input(s): CKTOTAL, CKMB, CKMBINDEX, TROPONINI in the last 168 hours. BNP (last 3 results) No results for input(s): PROBNP in the last 8760 hours. CBG: Recent Labs  Lab 03/13/21 0651 03/13/21 1150 03/13/21 1604 03/13/21 2101 03/14/21 0603  GLUCAP 110* 92 201* 156* 123*   D-Dimer: No results for input(s): DDIMER in the last 72 hours. Hgb A1c: Recent Labs    03/11/21 1342  HGBA1C 6.7*   Lipid Profile: No results for input(s): CHOL, HDL, LDLCALC, TRIG, CHOLHDL, LDLDIRECT in the last 72 hours. Thyroid function studies: No results for input(s): TSH, T4TOTAL, T3FREE, THYROIDAB in the last 72 hours.  Invalid input(s): FREET3 Anemia work up: No results for input(s): VITAMINB12, FOLATE, FERRITIN, TIBC, IRON, RETICCTPCT in the last 72 hours. Sepsis Labs: Recent Labs  Lab 03/12/21 0332 03/12/21 1546 03/13/21 0905 03/14/21 0241  WBC 5.8 5.7 6.0 6.6   Microbiology Recent Results (from the past 240 hour(s))  Resp Panel by RT-PCR (Flu A&B, Covid) Nasopharyngeal Swab     Status: None    Collection Time: 03/09/21  9:38 PM   Specimen: Nasopharyngeal Swab; Nasopharyngeal(NP) swabs in vial transport medium  Result Value Ref Range Status   SARS Coronavirus 2 by RT PCR NEGATIVE NEGATIVE Final    Comment: (NOTE) SARS-CoV-2 target nucleic acids are NOT DETECTED.  The SARS-CoV-2 RNA is generally detectable in upper respiratory specimens during the acute phase of infection. The lowest concentration of SARS-CoV-2 viral copies this assay can detect is 138 copies/mL. A negative result does not preclude SARS-Cov-2 infection and should not be used as the sole basis for treatment or other patient management decisions. A negative result may occur with  improper specimen collection/handling, submission of specimen other than nasopharyngeal swab, presence of viral mutation(s) within the areas targeted by this assay, and inadequate number of viral copies(<138 copies/mL). A negative result must be combined with clinical observations, patient history, and epidemiological information. The expected result is Negative.  Fact Sheet for Patients:  EntrepreneurPulse.com.au  Fact Sheet for Healthcare Providers:  IncredibleEmployment.be  This test is no t yet approved or cleared by the [place] FDA and  has been authorized for detection and/or diagnosis of SARS-CoV-2 by FDA under an Emergency Use Authorization (EUA). This EUA will remain  in effect (meaning this test can be used) for the duration of the COVID-19 declaration under Section 564(b)(1) of the Act, 21 U.S.C.section 360bbb-3(b)(1), unless the authorization is terminated  or revoked sooner.       Influenza A by PCR NEGATIVE NEGATIVE Final   Influenza B by PCR NEGATIVE NEGATIVE Final    Comment: (NOTE) The Xpert Xpress SARS-CoV-2/FLU/RSV plus assay is intended as an aid in the diagnosis of influenza from Nasopharyngeal swab specimens and should not be used as a sole basis for treatment.  Nasal washings and aspirates are unacceptable for Xpert Xpress SARS-CoV-2/FLU/RSV testing.  Fact Sheet for Patients: EntrepreneurPulse.com.au  Fact Sheet for Healthcare Providers: IncredibleEmployment.be  This test is not yet approved or cleared by the [place] FDA and has been authorized for detection and/or diagnosis of SARS-CoV-2 by FDA under an Emergency Use Authorization (EUA). This EUA will remain in effect (  meaning this test can be used) for the duration of the COVID-19 declaration under Section 564(b)(1) of the Act, 21 U.S.C. section 360bbb-3(b)(1), unless the authorization is terminated or revoked.  Performed at [facility], [place] [address] [place], [place] [address]      Medications:   . atorvastatin  20 mg Oral QHS  . carvedilol  25 mg Oral BID  . insulin aspart  0-5 Units Subcutaneous QHS  . insulin aspart  0-9 Units Subcutaneous TID WC  . insulin aspart  3 Units Subcutaneous TID WC  . insulin detemir  5 Units Subcutaneous BID  . latanoprost  1 drop Both Eyes QHS  . pantoprazole  40 mg Oral BID  . tamsulosin  0.4 mg Oral QHS   Continuous Infusions:     LOS: 4 days   Charlynne Cousins  [facility]  03/14/2021, 9:56 AM

## 2021-03-14 NOTE — Progress Notes (Signed)
Patient a little SHOB this morning, provider paged for PRN nebs.

## 2021-03-15 LAB — GLUCOSE, CAPILLARY
Glucose-Capillary: 129 mg/dL — ABNORMAL HIGH (ref 70–99)
Glucose-Capillary: 130 mg/dL — ABNORMAL HIGH (ref 70–99)
Glucose-Capillary: 152 mg/dL — ABNORMAL HIGH (ref 70–99)
Glucose-Capillary: 244 mg/dL — ABNORMAL HIGH (ref 70–99)

## 2021-03-15 MED ORDER — POLYETHYLENE GLYCOL 3350 17 G PO PACK
17.0000 g | PACK | Freq: Two times a day (BID) | ORAL | Status: DC
  Administered 2021-03-15 – 2021-03-16 (×3): 17 g via ORAL
  Filled 2021-03-15 (×3): qty 1

## 2021-03-15 NOTE — Progress Notes (Signed)
[facility] PROGRESS NOTE    Progress Note  [name]  RJJ:[id number] DOB: Aug 23, 1933 DOA: 03/09/2021 PCP: Reubin Milan, MD     Brief Narrative:   Scott [name] is an 87 y.o. male past medical history of chronic thrombocytopenia, diabetes mellitus type 2, essential hypertension chronic kidney disease stage III, chronic combined systolic and diastolic heart failure iron deficiency anemia comes into the hospital for shortness of breath and several days of black tarry stool.  In the ED was found to have a hemoglobin of 6 GI was consulted and admitted to the hospital.  Awaiting skilled nursing facility placement.  Assessment/Plan:   Acute upper GI bleed secondary to multiple gastric ulcers: Status post IV Feraheme EGD showed multiple small antral ulcers and erosive duodenitis. Cont on PPI. Had active melanotic stools overnight on 03/11/2021.  Currently on Protonix p.o. twice daily. Status post 4 units of packed red blood cells, hemoglobin is stable at 8. Physical therapy eval patient recommended skilled nursing facility. No changes overnight.  Acute kidney injury on chronic kidney disease stage IIIb: Likely due to hypotension resolved with blood transfusions and fluid resuscitation.  Elevated troponin's: Denies any chest pain likely demand ischemia in the setting of lower GI bleed.  Chronic respiratory failure with hypoxia: Chest x-ray showed pulmonary fibrosis will need to follow-up with pulmonary as an outpatient. Continue current home oxygen.  Chronic thrombocytopenia: Probably  contributing to his GI bleed will need to follow-up with hematology as an outpatient. Question MDS, will need biopsy as an outpatient. Platelet count today is 50,000 we will check a CBC posttransfusion all if platelets still low we will go ahead and give him a unit.  Hyperkalemia: In setting of acute kidney injury, resolved with IV fluid hydration.  Diabetes mellitus type 2: With an A1c of  6.7 with titration of long-acting insulin plus sliding scale his blood glucose improving continue check CBGs before meals and at bedtime.  Dyslipidemia: Continue statins.  Essential hypertension: Blood pressure is soft continue to hold home meds.  Chronic combined systolic and diastolic heart failure (HCC) Stable appears euvolemic with blood pressure started to improve we will go ahead and start restarting home meds.  DVT prophylaxis: SCD Family Communication:none Status is: Inpatient  Remains inpatient appropriate because:Hemodynamically unstable   Dispo: The patient is from: Home              Anticipated d/c is to: Home              Patient currently is not medically stable to d/c.   Difficult to place patient No        Code Status:     Code Status Orders  (From admission, onward)         Start     Ordered   03/10/21 0112  Do not attempt resuscitation (DNR)  Continuous       Question Answer Comment  In the event of cardiac or respiratory ARREST Do not call a "code blue"   In the event of cardiac or respiratory ARREST Do not perform Intubation, CPR, defibrillation or ACLS   In the event of cardiac or respiratory ARREST Use medication by any route, position, wound care, and other measures to relive pain and suffering. May use oxygen, suction and manual treatment of airway obstruction as needed for comfort.      03/10/21 0111        Code Status History    Date Active Date Inactive Code Status Order  ID Comments User Context   03/09/2021 2136 03/10/2021 0111 Full Code [id number]  Rhetta Mura, DO ED   02/25/2020 1545 02/26/2020 1845 Full Code [id number]  Elam Dutch, MD Inpatient   01/03/2020 1409 01/09/2020 2323 Full Code [id number]  Orbie Hurst Inpatient   10/16/2017 0159 10/18/2017 1607 Full Code [id number]  Vilma Prader, MD Inpatient   06/17/2017 1436 06/17/2017 2156 Full Code [id number]  [place], Peter M, MD Inpatient   05/21/2017 1557 05/22/2017 1810  Full Code [id number]  Rogelia Mire, NP Inpatient   05/12/2013 1847 05/13/2013 2058 Full Code [id number]  Liliane Shi, PA-C Inpatient   Advance Care Planning Activity        IV Access:    Peripheral IV   Procedures and diagnostic studies:   No results found.   Medical Consultants:    None.   Subjective:    [name] no melanotic stools.  Objective:    Vitals:   03/15/21 0328 03/15/21 0738 03/15/21 0835 03/15/21 0835  BP: (!) 115/56 (!) 128/51 111/66 111/66  Pulse: (!) 54 88  (!) 52  Resp: 18 18    Temp: 98.4 F (36.9 C) (!) 97.4 F (36.3 C)    TempSrc: Oral Oral    SpO2: 100% 98% 98% 98%  Weight:      Height:       SpO2: 98 % O2 Flow Rate (L/min): 3 L/min   Intake/Output Summary (Last 24 hours) at 03/15/2021 0920 Last data filed at 03/15/2021 0830 Gross per 24 hour  Intake 470 ml  Output 1475 ml  Net -1005 ml   Filed Weights   03/13/21 0530 03/14/21 0045 03/15/21 0141  Weight: 93 kg 93.5 kg 94.3 kg    Exam: General exam: In no acute distress. Respiratory system: Good air movement and clear to auscultation. Cardiovascular system: S1 & S2 heard, RRR. No JVD. Gastrointestinal system: Abdomen is nondistended, soft and nontender.  Extremities: No pedal edema. Skin: No rashes, lesions or ulcers Psychiatry: Judgement and insight appear normal. Mood & affect appropriate.   Data Reviewed:    Labs: Basic Metabolic Panel: Recent Labs  Lab 03/09/21 1621 03/10/21 1029 03/11/21 1342 03/12/21 0332  NA 134* 135 134* 136  K 5.4* 5.0 5.2* 4.7  CL 103 103 105 103  CO2 19* 22 22 23   GLUCOSE 209* 194* 315* 170*  BUN 157* 156* 145* 139*  CREATININE 3.06* 2.98* 2.93* 2.69*  CALCIUM 8.9 8.8* 8.6* 8.5*  MG  --  2.1  --   --    GFR Estimated Creatinine Clearance: 23.5 mL/min (A) (by C-G formula based on SCr of 2.69 mg/dL (H)). Liver Function Tests: Recent Labs  Lab 03/09/21 1621 03/10/21 1029  AST 20 25  ALT 17 19  ALKPHOS 73 60  BILITOT  1.1 1.3*  PROT 6.9 6.5  ALBUMIN 3.2* 3.1*   No results for input(s): LIPASE, AMYLASE in the last 168 hours. No results for input(s): AMMONIA in the last 168 hours. Coagulation profile Recent Labs  Lab 03/10/21 1029  INR 1.5*   COVID-19 Labs  No results for input(s): DDIMER, FERRITIN, LDH, CRP in the last 72 hours.  Lab Results  Component Value Date   SARSCOV2NAA NEGATIVE 03/09/2021   SARSCOV2NAA NEGATIVE 10/02/2020   [place] NEGATIVE 02/21/2020    CBC: Recent Labs  Lab 03/09/21 1621 03/10/21 1029 03/12/21 0332 03/12/21 1546 03/13/21 0905 03/14/21 0241  WBC 7.6 7.1 5.8 5.7 6.0 6.6  NEUTROABS 5.3  --   --   --   --   --  HGB 6.1* 7.9* 6.1* 7.7* 8.0* 8.0*  HCT 20.8* 25.5* 19.7* 24.5* 26.2* 26.1*  MCV 101.5* 95.1 95.2 92.8 95.6 94.6  PLT 86* 73* 50* 50* 45* 56*   Cardiac Enzymes: No results for input(s): CKTOTAL, CKMB, CKMBINDEX, TROPONINI in the last 168 hours. BNP (last 3 results) No results for input(s): PROBNP in the last 8760 hours. CBG: Recent Labs  Lab 03/14/21 0603 03/14/21 1152 03/14/21 1551 03/14/21 2043 03/15/21 0614  GLUCAP 123* 162* 195* 170* 129*   D-Dimer: No results for input(s): DDIMER in the last 72 hours. Hgb A1c: No results for input(s): HGBA1C in the last 72 hours. Lipid Profile: No results for input(s): CHOL, HDL, LDLCALC, TRIG, CHOLHDL, LDLDIRECT in the last 72 hours. Thyroid function studies: No results for input(s): TSH, T4TOTAL, T3FREE, THYROIDAB in the last 72 hours.  Invalid input(s): FREET3 Anemia work up: No results for input(s): VITAMINB12, FOLATE, FERRITIN, TIBC, IRON, RETICCTPCT in the last 72 hours. Sepsis Labs: Recent Labs  Lab 03/12/21 0332 03/12/21 1546 03/13/21 0905 03/14/21 0241  WBC 5.8 5.7 6.0 6.6   Microbiology Recent Results (from the past 240 hour(s))  Resp Panel by RT-PCR (Flu A&B, Covid) Nasopharyngeal Swab     Status: None   Collection Time: 03/09/21  9:38 PM   Specimen: Nasopharyngeal Swab;  Nasopharyngeal(NP) swabs in vial transport medium  Result Value Ref Range Status   SARS Coronavirus 2 by RT PCR NEGATIVE NEGATIVE Final    Comment: (NOTE) SARS-CoV-2 target nucleic acids are NOT DETECTED.  The SARS-CoV-2 RNA is generally detectable in upper respiratory specimens during the acute phase of infection. The lowest concentration of SARS-CoV-2 viral copies this assay can detect is 138 copies/mL. A negative result does not preclude SARS-Cov-2 infection and should not be used as the sole basis for treatment or other patient management decisions. A negative result may occur with  improper specimen collection/handling, submission of specimen other than nasopharyngeal swab, presence of viral mutation(s) within the areas targeted by this assay, and inadequate number of viral copies(<138 copies/mL). A negative result must be combined with clinical observations, patient history, and epidemiological information. The expected result is Negative.  Fact Sheet for Patients:  EntrepreneurPulse.com.au  Fact Sheet for Healthcare Providers:  IncredibleEmployment.be  This test is no t yet approved or cleared by the [place] FDA and  has been authorized for detection and/or diagnosis of SARS-CoV-2 by FDA under an Emergency Use Authorization (EUA). This EUA will remain  in effect (meaning this test can be used) for the duration of the COVID-19 declaration under Section 564(b)(1) of the Act, 21 U.S.C.section 360bbb-3(b)(1), unless the authorization is terminated  or revoked sooner.       Influenza A by PCR NEGATIVE NEGATIVE Final   Influenza B by PCR NEGATIVE NEGATIVE Final    Comment: (NOTE) The Xpert Xpress SARS-CoV-2/FLU/RSV plus assay is intended as an aid in the diagnosis of influenza from Nasopharyngeal swab specimens and should not be used as a sole basis for treatment. Nasal washings and aspirates are unacceptable for Xpert Xpress  SARS-CoV-2/FLU/RSV testing.  Fact Sheet for Patients: EntrepreneurPulse.com.au  Fact Sheet for Healthcare Providers: IncredibleEmployment.be  This test is not yet approved or cleared by the [place] FDA and has been authorized for detection and/or diagnosis of SARS-CoV-2 by FDA under an Emergency Use Authorization (EUA). This EUA will remain in effect (meaning this test can be used) for the duration of the COVID-19 declaration under Section 564(b)(1) of the Act, 21 U.S.C. section 360bbb-3(b)(1), unless  the authorization is terminated or revoked.  Performed at [facility], [place] [address], [place], [place] [address]      Medications:   . atorvastatin  20 mg Oral QHS  . carvedilol  25 mg Oral BID  . insulin aspart  0-5 Units Subcutaneous QHS  . insulin aspart  0-9 Units Subcutaneous TID WC  . insulin aspart  3 Units Subcutaneous TID WC  . insulin detemir  5 Units Subcutaneous BID  . latanoprost  1 drop Both Eyes QHS  . pantoprazole  40 mg Oral BID  . tamsulosin  0.4 mg Oral QHS   Continuous Infusions:     LOS: 5 days   Charlynne Cousins  [facility]  03/15/2021, 9:20 AM

## 2021-03-16 DIAGNOSIS — R5381 Other malaise: Secondary | ICD-10-CM | POA: Diagnosis not present

## 2021-03-16 DIAGNOSIS — R262 Difficulty in walking, not elsewhere classified: Secondary | ICD-10-CM | POA: Diagnosis not present

## 2021-03-16 DIAGNOSIS — N183 Chronic kidney disease, stage 3 unspecified: Secondary | ICD-10-CM | POA: Diagnosis not present

## 2021-03-16 DIAGNOSIS — N179 Acute kidney failure, unspecified: Secondary | ICD-10-CM | POA: Diagnosis not present

## 2021-03-16 DIAGNOSIS — G4733 Obstructive sleep apnea (adult) (pediatric): Secondary | ICD-10-CM | POA: Diagnosis not present

## 2021-03-16 DIAGNOSIS — I5042 Chronic combined systolic (congestive) and diastolic (congestive) heart failure: Secondary | ICD-10-CM | POA: Diagnosis not present

## 2021-03-16 DIAGNOSIS — R2681 Unsteadiness on feet: Secondary | ICD-10-CM | POA: Diagnosis not present

## 2021-03-16 DIAGNOSIS — R1312 Dysphagia, oropharyngeal phase: Secondary | ICD-10-CM | POA: Diagnosis not present

## 2021-03-16 DIAGNOSIS — R778 Other specified abnormalities of plasma proteins: Secondary | ICD-10-CM | POA: Diagnosis not present

## 2021-03-16 DIAGNOSIS — N189 Chronic kidney disease, unspecified: Secondary | ICD-10-CM | POA: Diagnosis not present

## 2021-03-16 DIAGNOSIS — K259 Gastric ulcer, unspecified as acute or chronic, without hemorrhage or perforation: Secondary | ICD-10-CM | POA: Diagnosis not present

## 2021-03-16 DIAGNOSIS — R062 Wheezing: Secondary | ICD-10-CM | POA: Diagnosis not present

## 2021-03-16 DIAGNOSIS — L03115 Cellulitis of right lower limb: Secondary | ICD-10-CM | POA: Diagnosis not present

## 2021-03-16 DIAGNOSIS — J8410 Pulmonary fibrosis, unspecified: Secondary | ICD-10-CM | POA: Diagnosis not present

## 2021-03-16 DIAGNOSIS — I259 Chronic ischemic heart disease, unspecified: Secondary | ICD-10-CM | POA: Diagnosis not present

## 2021-03-16 DIAGNOSIS — D62 Acute posthemorrhagic anemia: Secondary | ICD-10-CM | POA: Diagnosis not present

## 2021-03-16 DIAGNOSIS — K922 Gastrointestinal hemorrhage, unspecified: Secondary | ICD-10-CM | POA: Diagnosis not present

## 2021-03-16 DIAGNOSIS — I739 Peripheral vascular disease, unspecified: Secondary | ICD-10-CM | POA: Diagnosis not present

## 2021-03-16 DIAGNOSIS — Z794 Long term (current) use of insulin: Secondary | ICD-10-CM | POA: Diagnosis not present

## 2021-03-16 DIAGNOSIS — R9431 Abnormal electrocardiogram [ECG] [EKG]: Secondary | ICD-10-CM | POA: Diagnosis not present

## 2021-03-16 DIAGNOSIS — E119 Type 2 diabetes mellitus without complications: Secondary | ICD-10-CM | POA: Diagnosis not present

## 2021-03-16 DIAGNOSIS — D649 Anemia, unspecified: Secondary | ICD-10-CM | POA: Diagnosis not present

## 2021-03-16 DIAGNOSIS — M6281 Muscle weakness (generalized): Secondary | ICD-10-CM | POA: Diagnosis not present

## 2021-03-16 DIAGNOSIS — I2510 Atherosclerotic heart disease of native coronary artery without angina pectoris: Secondary | ICD-10-CM | POA: Diagnosis not present

## 2021-03-16 DIAGNOSIS — R279 Unspecified lack of coordination: Secondary | ICD-10-CM | POA: Diagnosis not present

## 2021-03-16 DIAGNOSIS — I509 Heart failure, unspecified: Secondary | ICD-10-CM | POA: Diagnosis not present

## 2021-03-16 LAB — SARS CORONAVIRUS 2 (TAT 6-24 HRS): SARS Coronavirus 2: NEGATIVE

## 2021-03-16 LAB — GLUCOSE, CAPILLARY
Glucose-Capillary: 125 mg/dL — ABNORMAL HIGH (ref 70–99)
Glucose-Capillary: 180 mg/dL — ABNORMAL HIGH (ref 70–99)

## 2021-03-16 MED ORDER — ALPRAZOLAM 0.25 MG PO TABS: 0.2500 mg | ORAL_TABLET | Freq: Every evening | ORAL | 0 refills | Status: DC | PRN

## 2021-03-16 MED ORDER — CARVEDILOL 6.25 MG PO TABS: 6.2500 mg | ORAL_TABLET | Freq: Two times a day (BID) | ORAL | 3 refills | Status: DC

## 2021-03-16 MED ORDER — OXYCODONE-ACETAMINOPHEN 5-325 MG PO TABS: 1.0000 | ORAL_TABLET | Freq: Four times a day (QID) | ORAL | 0 refills | Status: DC | PRN

## 2021-03-16 NOTE — TOC Transition Note (Signed)
Transition of Care [facility]) - CM/SW Discharge Note   Patient Details  Name: Scott [name] MRN: [id number] Date of Birth: [birth year]  [facility] CM/SW Contact:  Tresa Endo Phone Number: 03/16/2021, 12:05 PM   Clinical Narrative:     Patient will DC to: [facility] Anticipated DC date: 03/16/2021 Family notified: Pt Brother Transport by: Pt Brother   Per MD patient ready for DC to [facility]. RN to call report prior to discharge [phone]). RN, patient, patient's family, and facility notified of DC. Discharge Summary and FL2 sent to facility. DC packet on chart. Ambulance transport requested for patient.   CSW will sign off for now as social work intervention is no longer needed. Please consult [place] again if new needs arise.           Patient Goals and CMS Choice        Discharge Placement                       Discharge Plan and Services                                     Social Determinants of Health (SDOH) Interventions     Readmission Risk Interventions Readmission Risk Prevention Plan 01/09/2020  Transportation Screening Complete  PCP or Specialist Appt within 5-7 Days Complete  Home Care Screening Complete  Medication Review (RN CM) Complete  Some recent data might be hidden

## 2021-03-16 NOTE — Care Management Important Message (Signed)
Important Message  Patient Details  Name: Scott [name] MRN: [id number] Date of Birth: 1933/03/21   Medicare Important Message Given:  Yes     Shelda Altes 03/16/2021, 9:25 AM

## 2021-03-16 NOTE — Plan of Care (Signed)

## 2021-03-16 NOTE — Discharge Summary (Addendum)
Physician Discharge Summary  VAN SEYMORE FUX:[id number] DOB: Feb 03, 1933 DOA: 03/09/2021  PCP: Reubin Milan, MD  Admit date: 03/09/2021 Discharge date: 03/16/2021  Admitted From: Home Disposition:  SNF  Recommendations for Outpatient Follow-up:  1. Follow up with PCP in 1-2 weeks 2. Please obtain BMP/CBC in one week   Home Health:No Equipment/Devices:None  Discharge Condition:Stable CODE STATUS:dnr Diet recommendation: Heart Healthy   Brief/Interim Summary: 87 y.o. male past medical history of chronic thrombocytopenia, diabetes mellitus type 2, essential hypertension chronic kidney disease stage III, chronic combined systolic and diastolic heart failure iron deficiency anemia comes into the hospital for shortness of breath and several days of black tarry stool.  In the ED was found to have a hemoglobin of 6 GI was consulted and admitted to the hospital.   Discharge Diagnoses:  Principal Problem:   Acute upper GI bleed Active Problems:   Hypertension   Dyslipidemia   Diabetes mellitus, type II, insulin dependent (HCC)   OSA (obstructive sleep apnea)   Chronic combined systolic and diastolic heart failure (HCC)   Acute on chronic blood loss anemia   Acute kidney injury superimposed on CKD (HCC)   Elevated troponin   Shortness of breath   GI bleed   Multiple gastric ulcers  Acute upper GI bleed secondary to multiple gastric ulcers: GI was consulted he was started on IV Protonix EGD was done that showed a small antral ulcers with erosive duodenitis. His Protonix was transitioned to p.o. twice daily he is status post 4 units of packed red blood cells, since then hemoglobin since then has remained stable. Physical therapy evaluated the patient and recommended skilled nursing facility.  Acute kidney injury on chronic kidney disease stage IIIb: Likely due to hypotension in the setting of GI bleed status posttransfusion of and fluid fluid resuscitation his creatinine returned to  baseline.  Elevated troponin eyes: Denies any chest pain likely demand ischemia in the setting of lower GI bleed and chronic kidney disease stage IIIb.  Chronic respiratory failure with hypoxia: Chest x-ray showed pulmonary fibrosis will need follow-up with pulmonary as an outpatient. Continue home oxygen.  Chronic thrombocytopenia: Probably worsen his GI bleed will need to follow-up with hematology as an outpatient. Question if he has MDS will need a biopsy as an outpatient. His platelet count has been stable.  Hyperkalemia: In the setting of acute kidney injury resolved with IV fluids.  Diabetes mellitus type 2: With an A1c of 6.7 no changes made to his medication.  Discharge Instructions  Discharge Instructions    Diet - low sodium heart healthy   Complete by: As directed    Increase activity slowly   Complete by: As directed      Allergies as of 03/16/2021   No Known Allergies     Medication List    TAKE these medications   allopurinol 100 MG tablet Commonly known as: ZYLOPRIM Take 1 tablet (100 mg total) by mouth daily.   ALPRAZolam 0.25 MG tablet Commonly known as: Xanax Take 1 tablet (0.25 mg total) by mouth at bedtime as needed for sleep.   aspirin 81 MG EC tablet Take 81 mg by mouth at bedtime.   atorvastatin 20 MG tablet Commonly known as: LIPITOR Take 1 tablet (20 mg total) by mouth daily. What changed: when to take this   bismuth subsalicylate 025 [place]/70WC suspension Commonly known as: PEPTO BISMOL Take 30 mLs by mouth every 6 (six) hours as needed for indigestion.   carvedilol 6.25 MG tablet Commonly  known as: COREG Take 1 tablet (6.25 mg total) by mouth 2 (two) times daily with a meal. What changed:   medication strength  how much to take  when to take this   colchicine 0.6 MG tablet Take 0.6-1.2 mg by mouth See admin instructions. Take 2 tablets (1.2 mg) by mouth at onset of gout attack, may take 1 tablet (0.6 mg) one hour later if still  needed. Max 3 tablets in 3 days   Febuxostat 80 MG Tabs Take 40 mg by mouth every morning.   ferrous sulfate 325 (65 FE) MG tablet Take 325 mg by mouth every Monday, Wednesday, and Friday.   fluticasone 50 MCG/ACT nasal spray Commonly known as: FLONASE Place 2 sprays into both nostrils daily as needed for allergies or rhinitis.   furosemide 40 MG tablet Commonly known as: LASIX Take 40 mg by mouth See admin instructions. Take one tablet (40 mg) by mouth daily, may increase to 2 tablets (80 mg) daily as needed for weight gain of 2-3 lbs or leg swelling   insulin aspart protamine- aspart (70-30) 100 UNIT/ML injection Commonly known as: NOVOLOG MIX 70/30 Inject 30 Units into the skin 2 (two) times daily with a meal.   latanoprost 0.005 % ophthalmic solution Commonly known as: XALATAN Place 1 drop into both eyes at bedtime.   multivitamin with minerals Tabs tablet Take 1 tablet by mouth daily.   nitroGLYCERIN 0.4 MG SL tablet Commonly known as: NITROSTAT Place 1 tablet (0.4 mg total) under the tongue every 5 (five) minutes as needed for chest pain (up to 3 doses).   oxyCODONE-acetaminophen 5-325 MG tablet Commonly known as: PERCOCET/ROXICET Take 1-2 tablets by mouth every 6 (six) hours as needed for up to 3 days for severe pain.   pantoprazole 40 MG tablet Commonly known as: PROTONIX Take 1 tablet (40 mg total) by mouth 2 (two) times daily. What changed: when to take this   [place] into the lungs at bedtime. CPAP   tamsulosin 0.4 MG Caps capsule Commonly known as: FLOMAX Take 0.4 mg by mouth at bedtime.   Vitamin D 50 MCG (2000 UT) tablet Take 2,000 Units by mouth every Monday, Wednesday, and Friday.       Contact information for after-discharge care    Destination    [facility] Preferred SNF .   Service: Skilled Nursing Contact information: [address] [place] [phone]                  No Known Allergies  Consultations:  Gastroenterology   Procedures/Studies: DG Ribs Unilateral W/Chest Right  Result Date: 03/09/2021 CLINICAL DATA:  Fall EXAM: RIGHT RIBS AND CHEST - 3+ VIEW COMPARISON:  October 15, 2017 FINDINGS: The cardiomediastinal silhouette is unchanged and enlarged in contour.Status post median sternotomy and CABG. Atherosclerotic calcifications. No pleural effusion. No pneumothorax. Similar appearance of coarse bibasilar reticular opacities. Surgical sutures and clips project over the RIGHT abdomen. Multilevel degenerative changes of the thoracic spine. Contour deformity of the RIGHT anterior eighth rib consistent with subacute to chronic prior fracture. No acute displaced rib fracture. IMPRESSION: 1. Subacute to chronic rib fracture of the RIGHT anterior eighth rib. Recommend correlation with point tenderness. 2. Bibasilar coarse reticular opacities likely reflecting underlying pulmonary fibrosis. Electronically Signed   By: Valentino Saxon MD   On: 03/09/2021 17:04   (Echo, Carotid, EGD, Colonoscopy, ERCP)    Subjective: No complaints.  Discharge Exam: Vitals:   03/16/21  0307 03/16/21 0733  BP: (!) 117/59 134/61  Pulse: (!) 54 (!) 53  Resp:    Temp:  (!) 97.4 F (36.3 C)  SpO2: 100% 100%   Vitals:   03/16/21 0049 03/16/21 0300 03/16/21 0307 03/16/21 0733  BP: (!) 107/58  (!) 117/59 134/61  Pulse: (!) 54  (!) 54 (!) 53  Resp: 20 19    Temp: (!) 97.5 F (36.4 C) (!) 97.4 F (36.3 C)  (!) 97.4 F (36.3 C)  TempSrc: Oral Oral  Oral  SpO2: 100%  100% 100%  Weight:   93.9 kg   Height:        General: Pt is alert, awake, not in acute distress Cardiovascular: RRR, S1/S2 +, no rubs, no gallops Respiratory: CTA bilaterally, no wheezing, no rhonchi Abdominal: Soft, NT, ND, bowel sounds + Extremities: no edema, no cyanosis    The results of significant diagnostics from this hospitalization (including imaging, microbiology, ancillary and  laboratory) are listed below for reference.     Microbiology: Recent Results (from the past 240 hour(s))  Resp Panel by RT-PCR (Flu A&B, Covid) Nasopharyngeal Swab     Status: None   Collection Time: 03/09/21  9:38 PM   Specimen: Nasopharyngeal Swab; Nasopharyngeal(NP) swabs in vial transport medium  Result Value Ref Range Status   SARS Coronavirus 2 by RT PCR NEGATIVE NEGATIVE Final    Comment: (NOTE) SARS-CoV-2 target nucleic acids are NOT DETECTED.  The SARS-CoV-2 RNA is generally detectable in upper respiratory specimens during the acute phase of infection. The lowest concentration of SARS-CoV-2 viral copies this assay can detect is 138 copies/mL. A negative result does not preclude SARS-Cov-2 infection and should not be used as the sole basis for treatment or other patient management decisions. A negative result may occur with  improper specimen collection/handling, submission of specimen other than nasopharyngeal swab, presence of viral mutation(s) within the areas targeted by this assay, and inadequate number of viral copies(<138 copies/mL). A negative result must be combined with clinical observations, patient history, and epidemiological information. The expected result is Negative.  Fact Sheet for Patients:  EntrepreneurPulse.com.au  Fact Sheet for Healthcare Providers:  IncredibleEmployment.be  This test is no t yet approved or cleared by the [place] FDA and  has been authorized for detection and/or diagnosis of SARS-CoV-2 by FDA under an Emergency Use Authorization (EUA). This EUA will remain  in effect (meaning this test can be used) for the duration of the COVID-19 declaration under Section 564(b)(1) of the Act, 21 U.S.C.section 360bbb-3(b)(1), unless the authorization is terminated  or revoked sooner.       Influenza A by PCR NEGATIVE NEGATIVE Final   Influenza B by PCR NEGATIVE NEGATIVE Final    Comment: (NOTE) The  Xpert Xpress SARS-CoV-2/FLU/RSV plus assay is intended as an aid in the diagnosis of influenza from Nasopharyngeal swab specimens and should not be used as a sole basis for treatment. Nasal washings and aspirates are unacceptable for Xpert Xpress SARS-CoV-2/FLU/RSV testing.  Fact Sheet for Patients: EntrepreneurPulse.com.au  Fact Sheet for Healthcare Providers: IncredibleEmployment.be  This test is not yet approved or cleared by the [place] FDA and has been authorized for detection and/or diagnosis of SARS-CoV-2 by FDA under an Emergency Use Authorization (EUA). This EUA will remain in effect (meaning this test can be used) for the duration of the COVID-19 declaration under Section 564(b)(1) of the Act, 21 U.S.C. section 360bbb-3(b)(1), unless the authorization is terminated or revoked.  Performed at [facility], [address] [place], [place] [address]   SARS CORONAVIRUS 2 (TAT 6-24 HRS) Nasopharyngeal Nasopharyngeal Swab     Status: None   Collection Time: 03/15/21  3:23 PM   Specimen: Nasopharyngeal Swab  Result Value Ref Range Status   SARS Coronavirus 2 NEGATIVE NEGATIVE Final    Comment: (NOTE) SARS-CoV-2 target nucleic acids are NOT DETECTED.  The SARS-CoV-2 RNA is generally detectable in upper and lower respiratory specimens during the acute phase of infection. Negative results do not preclude SARS-CoV-2 infection, do not rule out co-infections with other pathogens, and should not be used as the sole basis for treatment or other patient management decisions. Negative results must be combined with clinical observations, patient history, and epidemiological information. The expected result is Negative.  Fact Sheet for Patients: SugarRoll.be  Fact Sheet for Healthcare Providers: https://www.woods-mathews.com/  This test is not yet approved or cleared by the [place] FDA  and  has been authorized for detection and/or diagnosis of SARS-CoV-2 by FDA under an Emergency Use Authorization (EUA). This EUA will remain  in effect (meaning this test can be used) for the duration of the COVID-19 declaration under Se ction 564(b)(1) of the Act, 21 U.S.C. section 360bbb-3(b)(1), unless the authorization is terminated or revoked sooner.  Performed at [facility], [place] [address] [place], [place] [address]      Labs: BNP (last 3 results) Recent Labs    09/12/20 1226  BNP 010.2*   Basic Metabolic Panel: Recent Labs  Lab 03/09/21 1621 03/10/21 1029 03/11/21 1342 03/12/21 0332  NA 134* 135 134* 136  K 5.4* 5.0 5.2* 4.7  CL 103 103 105 103  CO2 19* 22 22 23   GLUCOSE 209* 194* 315* 170*  BUN 157* 156* 145* 139*  CREATININE 3.06* 2.98* 2.93* 2.69*  CALCIUM 8.9 8.8* 8.6* 8.5*  MG  --  2.1  --   --    Liver Function Tests: Recent Labs  Lab 03/09/21 1621 03/10/21 1029  AST 20 25  ALT 17 19  ALKPHOS 73 60  BILITOT 1.1 1.3*  PROT 6.9 6.5  ALBUMIN 3.2* 3.1*   No results for input(s): LIPASE, AMYLASE in the last 168 hours. No results for input(s): AMMONIA in the last 168 hours. CBC: Recent Labs  Lab 03/09/21 1621 03/10/21 1029 03/12/21 0332 03/12/21 1546 03/13/21 0905 03/14/21 0241  WBC 7.6 7.1 5.8 5.7 6.0 6.6  NEUTROABS 5.3  --   --   --   --   --   HGB 6.1* 7.9* 6.1* 7.7* 8.0* 8.0*  HCT 20.8* 25.5* 19.7* 24.5* 26.2* 26.1*  MCV 101.5* 95.1 95.2 92.8 95.6 94.6  PLT 86* 73* 50* 50* 45* 56*   Cardiac Enzymes: No results for input(s): CKTOTAL, CKMB, CKMBINDEX, TROPONINI in the last 168 hours. BNP: Invalid input(s): POCBNP CBG: Recent Labs  Lab 03/15/21 1035 03/15/21 1556 03/15/21 2105 03/16/21 0603 03/16/21 1113  GLUCAP 130* 152* 244* 125* 180*   D-Dimer No results for input(s): DDIMER in the last 72 hours. Hgb A1c No results for input(s): HGBA1C in the last 72 hours. Lipid Profile No results for input(s): CHOL, HDL,  LDLCALC, TRIG, CHOLHDL, LDLDIRECT in the last 72 hours. Thyroid function studies No results for input(s): TSH, T4TOTAL, T3FREE, THYROIDAB in the last 72 hours.  Invalid input(s): FREET3 Anemia work up No results for input(s): VITAMINB12, FOLATE, FERRITIN, TIBC, IRON, RETICCTPCT in the last 72 hours. Urinalysis    Component Value Date/Time   COLORURINE YELLOW 03/10/2021  [place] 03/10/2021 1029   LABSPEC 1.010 03/10/2021 1029   PHURINE 5.0 03/10/2021 1029   GLUCOSEU NEGATIVE 03/10/2021 [place] 03/10/2021 [place] 03/10/2021 [place] 03/10/2021 1029   PROTEINUR NEGATIVE 03/10/2021 1029   UROBILINOGEN 1.0 08/31/2012 1735   NITRITE NEGATIVE 03/10/2021 1029   LEUKOCYTESUR NEGATIVE 03/10/2021 1029   Sepsis Labs Invalid input(s): PROCALCITONIN,  WBC,  LACTICIDVEN Microbiology Recent Results (from the past 240 hour(s))  Resp Panel by RT-PCR (Flu A&B, Covid) Nasopharyngeal Swab     Status: None   Collection Time: 03/09/21  9:38 PM   Specimen: Nasopharyngeal Swab; Nasopharyngeal(NP) swabs in vial transport medium  Result Value Ref Range Status   SARS Coronavirus 2 by RT PCR NEGATIVE NEGATIVE Final    Comment: (NOTE) SARS-CoV-2 target nucleic acids are NOT DETECTED.  The SARS-CoV-2 RNA is generally detectable in upper respiratory specimens during the acute phase of infection. The lowest concentration of SARS-CoV-2 viral copies this assay can detect is 138 copies/mL. A negative result does not preclude SARS-Cov-2 infection and should not be used as the sole basis for treatment or other patient management decisions. A negative result may occur with  improper specimen collection/handling, submission of specimen other than nasopharyngeal swab, presence of viral mutation(s) within the areas targeted by this assay, and inadequate number of viral copies(<138 copies/mL). A negative result must be combined with clinical  observations, patient history, and epidemiological information. The expected result is Negative.  Fact Sheet for Patients:  EntrepreneurPulse.com.au  Fact Sheet for Healthcare Providers:  IncredibleEmployment.be  This test is no t yet approved or cleared by the [place] FDA and  has been authorized for detection and/or diagnosis of SARS-CoV-2 by FDA under an Emergency Use Authorization (EUA). This EUA will remain  in effect (meaning this test can be used) for the duration of the COVID-19 declaration under Section 564(b)(1) of the Act, 21 U.S.C.section 360bbb-3(b)(1), unless the authorization is terminated  or revoked sooner.       Influenza A by PCR NEGATIVE NEGATIVE Final   Influenza B by PCR NEGATIVE NEGATIVE Final    Comment: (NOTE) The Xpert Xpress SARS-CoV-2/FLU/RSV plus assay is intended as an aid in the diagnosis of influenza from Nasopharyngeal swab specimens and should not be used as a sole basis for treatment. Nasal washings and aspirates are unacceptable for Xpert Xpress SARS-CoV-2/FLU/RSV testing.  Fact Sheet for Patients: EntrepreneurPulse.com.au  Fact Sheet for Healthcare Providers: IncredibleEmployment.be  This test is not yet approved or cleared by the [place] FDA and has been authorized for detection and/or diagnosis of SARS-CoV-2 by FDA under an Emergency Use Authorization (EUA). This EUA will remain in effect (meaning this test can be used) for the duration of the COVID-19 declaration under Section 564(b)(1) of the Act, 21 U.S.C. section 360bbb-3(b)(1), unless the authorization is terminated or revoked.  Performed at [facility], [place] [address] [place], [place] [address]   SARS CORONAVIRUS 2 (TAT 6-24 HRS) Nasopharyngeal Nasopharyngeal Swab     Status: None   Collection Time: 03/15/21  3:23 PM   Specimen: Nasopharyngeal Swab  Result Value Ref Range Status   SARS  Coronavirus 2 NEGATIVE NEGATIVE Final    Comment: (NOTE) SARS-CoV-2 target nucleic acids are NOT DETECTED.  The SARS-CoV-2 RNA is generally detectable in upper and lower respiratory specimens during the acute phase of infection. Negative results do not preclude SARS-CoV-2 infection, do not rule out co-infections with  other pathogens, and should not be used as the sole basis for treatment or other patient management decisions. Negative results must be combined with clinical observations, patient history, and epidemiological information. The expected result is Negative.  Fact Sheet for Patients: SugarRoll.be  Fact Sheet for Healthcare Providers: https://www.woods-mathews.com/  This test is not yet approved or cleared by the [place] FDA and  has been authorized for detection and/or diagnosis of SARS-CoV-2 by FDA under an Emergency Use Authorization (EUA). This EUA will remain  in effect (meaning this test can be used) for the duration of the COVID-19 declaration under Se ction 564(b)(1) of the Act, 21 U.S.C. section 360bbb-3(b)(1), unless the authorization is terminated or revoked sooner.  Performed at [facility], [place] [address] [place], [place] [address]      Time coordinating discharge: Over 30 minutes  SIGNED:   Charlynne Cousins, MD  [facility] 03/16/2021, 12:08 PM Pager   If 7PM-7AM, please contact night-coverage [email or web address] Password [email or web address]

## 2021-03-17 ENCOUNTER — Non-Acute Institutional Stay (SKILLED_NURSING_FACILITY): Payer: Medicare HMO | Admitting: Adult Health

## 2021-03-17 ENCOUNTER — Encounter: Payer: Self-pay | Admitting: Adult Health

## 2021-03-17 DIAGNOSIS — E119 Type 2 diabetes mellitus without complications: Secondary | ICD-10-CM | POA: Diagnosis not present

## 2021-03-17 DIAGNOSIS — N189 Chronic kidney disease, unspecified: Secondary | ICD-10-CM

## 2021-03-17 DIAGNOSIS — Z794 Long term (current) use of insulin: Secondary | ICD-10-CM

## 2021-03-17 DIAGNOSIS — I5042 Chronic combined systolic (congestive) and diastolic (congestive) heart failure: Secondary | ICD-10-CM | POA: Diagnosis not present

## 2021-03-17 DIAGNOSIS — R5381 Other malaise: Secondary | ICD-10-CM

## 2021-03-17 DIAGNOSIS — D62 Acute posthemorrhagic anemia: Secondary | ICD-10-CM

## 2021-03-17 DIAGNOSIS — N179 Acute kidney failure, unspecified: Secondary | ICD-10-CM | POA: Diagnosis not present

## 2021-03-17 DIAGNOSIS — K922 Gastrointestinal hemorrhage, unspecified: Secondary | ICD-10-CM

## 2021-03-17 NOTE — Progress Notes (Signed)
Location:  [place] Room Number: [place] of Service:  SNF (31) Provider:  Durenda Age, DNP, FNP-BC  Patient Care Team: Reubin Milan, MD as PCP - General (Internal Medicine) Martinique, Peter M, MD as PCP - Cardiology (Cardiology)  Extended Emergency Contact Information Primary Emergency Contact: [place], [place] [address] [address]nette Litter of [place] Phone: [phone] Mobile Phone: [phone] Relation: Brother  Code Status:  DNR  Goals of care: Advanced Directive information Advanced Directives 03/23/2021  Does Patient Have a Medical Advance Directive? Yes  Type of Advance Directive Out of facility DNR (pink MOST or yellow form)  Does patient want to make changes to medical advance directive? No - Patient declined  Would patient like information on creating a medical advance directive? No - Patient declined  Pre-existing out of facility DNR order (yellow form or pink MOST form) -     Chief Complaint  Patient presents with   Acute Visit    Follow up hospitalization    HPI:  Pt is a 87 y.o. male who was admitted to [place] on 03/16/21 post hospital admission 03/09/21 to 03/16/21 for acute GI Bleed secondary to multiple gastric ulcers.  He came to the hospital for shortness of breath and several days of black tarry stools.  GI was consulted and he was a started on IV Protonix.  EGD showed small antral ulcers with erosive duodenitis.  His Protonix was transitioned to p.o. twice a day.  He was transfused 4 units of PRBC and his hemoglobin since then has remained stable.  He had AKI on chronic kidney disease stage IIIb and was likely thought to be due to hypotension in the setting of GI bleed.  He was admitted to [facility] for a short-term rehabilitation.   Past Medical History:  Diagnosis Date   Acute biliary pancreatitis 10/2017   with choledocholithiasis.  ERCP, stone extraction performed    Arthritis    CAD (coronary artery disease)    a. 1997 CABGx3 (VG->RPDA, VG->PLV, LIMA->LAD);  b. 07/2004 PCI VG->PLV (3.5x16 Taxus DES, 3.5x12 Taxus DES); c. 11/2009 PCI: VG->PLV 3.5x12 Promus DES), VG->RPDA (3.5x15 Promus DES); d. 08/2012 Cath: patent grafts; e. 05/2013 Lexiscan MV: no ischemia/infarction, EF 44%.   Cataract    Chronic combined systolic and diastolic CHF (congestive heart failure) ([place]) 08/2012   a. EF 50-55% 11/2011; b. 08/2012 - EF 25-30%,  with mild RM, mod TR, mod RAE & LAE, moderate reduced RV systolic function and PASP of 51mmHg. - > cath showed patent grafts & Mod Puml HTN wth elevated PCWP;  c. 01/2014 Echo: EF 35-40%, mod LVH, mod HK, mildly dil LA, mod dil RA, PASP 52mmHg.   CKD (chronic kidney disease), stage III (HCC)    Dyslipidemia    GERD (gastroesophageal reflux disease)    Gout    history colon cancer 2005   colon surgery done   History of home oxygen therapy    uses oxygen nightly with cpap    Hyperkalemia    Hypertension    Insulin Dependent Diabetes mellitus    type 2   Myocardial infarction [facility]) 1997   PAD (peripheral artery disease) ([place])    Sleep apnea    Thrombocytopenia ([place]) 2011   dates back to 2011   UTI (lower urinary tract infection) 08/2012   E.coli   Past Surgical History:  Procedure Laterality Date   ABDOMINAL AORTOGRAM W/LOWER EXTREMITY Right  01/04/2020   Abdominal aortogram with right lower extremity runoff   ABDOMINAL AORTOGRAM W/LOWER EXTREMITY Bilateral 01/04/2020   Procedure: ABDOMINAL AORTOGRAM W/LOWER EXTREMITY;  Surgeon: Elam Dutch, MD;  Location: [place] CV LAB;  Service: Vascular;  Laterality: Bilateral;   AMPUTATION Right 02/25/2020   Procedure: RIGHT FIFTH TOE AMPUTATION;  Surgeon: Elam Dutch, MD;  Location: [place];  Service: Vascular;  Laterality: Right;   AMPUTATION Right 10/06/2020   Procedure: RIGHT FOURTH TOE AMPUTATION WITH RESECTION OF METATARSAL HEAD;  Surgeon: Elam Dutch, MD;  Location: [place]  OR;  Service: Vascular;  Laterality: Right;   APPENDECTOMY     BACK SURGERY     15 years ago lower   BIOPSY  03/10/2021   Procedure: BIOPSY;  Surgeon: Irene Shipper, MD;  Location: [facility] ENDOSCOPY;  Service: Endoscopy;;   CARDIAC CATHETERIZATION  2005   stenting of the vein graft to the posterior lateral per -- Dr. Martinique       CARDIAC CATHETERIZATION  2011   showed right coronary, totally occluded LAD and severe stenosis in both saphenous vein graft to the posterior lateral and posterior descending  -- stenting of the proximal portion of the SVG to the posterior lateral branch in 2/11   CHOLECYSTECTOMY     COLONOSCOPY     x several - colon polyps   CORONARY ARTERY BYPASS GRAFT  1997    (LIMA to LAD, SVG to PDA, SVG to PL)   ERCP N/A 10/17/2017    Irene Shipper, MD;  [facility] ENDOSCOPY; choledocholithiasis, biliary pancreatitis.  s/p sphincterotomy and stone extraction.     ESOPHAGOGASTRODUODENOSCOPY (EGD) WITH PROPOFOL N/A 09/21/2017   Procedure: ESOPHAGOGASTRODUODENOSCOPY (EGD) WITH PROPOFOL;  Surgeon: Doran Stabler, MD;  Location: [facility] ENDOSCOPY;  Service: Gastroenterology;  Laterality: N/A;   ESOPHAGOGASTRODUODENOSCOPY (EGD) WITH PROPOFOL N/A 03/10/2021   Procedure: ESOPHAGOGASTRODUODENOSCOPY (EGD) WITH PROPOFOL;  Surgeon: Irene Shipper, MD;  Location: [facility] ENDOSCOPY;  Service: Endoscopy;  Laterality: N/A;   EYE SURGERY     ioc for cataract   HERNIA REPAIR     LEFT AND RIGHT HEART CATHETERIZATION WITH CORONARY/GRAFT ANGIOGRAM  09/04/2012   Procedure: LEFT AND RIGHT HEART CATHETERIZATION WITH Beatrix Fetters;  Surgeon: Sherren Mocha, MD;  Location: [facility] CATH LAB;  patent grafts, mod 2ndary pulmonary HTN, elevated LV EDP & PCWP   LEFT HEART CATH AND CORS/GRAFTS ANGIOGRAPHY N/A 06/17/2017   Procedure: LEFT HEART CATH AND CORS/GRAFTS ANGIOGRAPHY;  Surgeon: Martinique, Peter M, MD;  Location: [place] CV LAB;  Service: Cardiovascular;  Laterality: N/A;   mohs     x several   PARTIAL COLECTOMY      cancerous polyps   stent to heart  last done 5 to 6 yrs ago   x 4   [facility] ENDOSCOPY      No Known Allergies  Outpatient Encounter Medications as of 03/17/2021  Medication Sig   allopurinol (ZYLOPRIM) 100 MG tablet Take 1 tablet (100 mg total) by mouth daily.   ALPRAZolam (XANAX) 0.25 MG tablet Take 1 tablet (0.25 mg total) by mouth at bedtime as needed for sleep.   aspirin 81 MG EC tablet Take 81 mg by mouth at bedtime.   atorvastatin (LIPITOR) 20 MG tablet Take 1 tablet (20 mg total) by mouth daily.   bisacodyl (DULCOLAX) 10 MG suppository Place 10 mg rectally as needed for moderate constipation.   bismuth subsalicylate (PEPTO BISMOL) 262 MG/15ML suspension Take 30 mLs by mouth every 6 (six)  hours as needed for indigestion.   carvedilol (COREG) 6.25 MG tablet Take 1 tablet (6.25 mg total) by mouth 2 (two) times daily with a meal.   Cholecalciferol (VITAMIN D) 50 MCG (2000 UT) tablet Take 2,000 Units by mouth every Monday, Wednesday, and Friday.   colchicine 0.6 MG tablet Take 0.6-1.2 mg by mouth See admin instructions. Take 2 tablets (1.2 mg) by mouth at onset of gout attack, may take 1 tablet (0.6 mg) one hour later if still needed. Max 3 tablets in 3 days   febuxostat (ULORIC) 40 MG tablet Take 40 mg by mouth every morning.   ferrous sulfate 325 (65 FE) MG tablet Take 325 mg by mouth every Monday, Wednesday, and Friday.   furosemide (LASIX) 40 MG tablet Take 40 mg by mouth See admin instructions. Take one tablet (40 mg) by mouth daily, may increase to 2 tablets (80 mg) daily as needed for weight gain of 2-3 lbs or leg swelling   insulin aspart protamine- aspart (NOVOLOG MIX 70/30) (70-30) 100 UNIT/ML injection Inject 30 Units into the skin 2 (two) times daily with a meal.    latanoprost (XALATAN) 0.005 % ophthalmic solution Place 1 drop into both eyes at bedtime.   magnesium hydroxide (MILK OF MAGNESIA) 400 MG/5ML suspension Take 30 mLs by mouth daily as needed for mild constipation.    Multiple Vitamin (MULTIVITAMIN WITH MINERALS) TABS tablet Take 1 tablet by mouth daily.   nitroGLYCERIN (NITROSTAT) 0.4 MG SL tablet Place 1 tablet (0.4 mg total) under the tongue every 5 (five) minutes as needed for chest pain (up to 3 doses).   pantoprazole (PROTONIX) 40 MG tablet Take 1 tablet (40 mg total) by mouth 2 (two) times daily.   PRESCRIPTION MEDICATION Inhale into the lungs at bedtime. CPAP   tamsulosin (FLOMAX) 0.4 MG CAPS capsule Take 0.4 mg by mouth at bedtime.   [DISCONTINUED] oxyCODONE-acetaminophen (PERCOCET/ROXICET) 5-325 MG tablet Take 1-2 tablets by mouth every 6 (six) hours as needed for up to 3 days for severe pain.   oxyCODONE-acetaminophen (PERCOCET/ROXICET) 5-325 MG tablet Take 1 tablet by mouth every 6 (six) hours as needed for severe pain.   [DISCONTINUED] fluticasone (FLONASE) 50 MCG/ACT nasal spray Place 2 sprays into both nostrils daily as needed for allergies or rhinitis.   No facility-administered encounter medications on file as of 03/17/2021.    Review of Systems  GENERAL: No fever or chills  MOUTH and THROAT: Denies oral discomfort, gingival pain or bleeding, pain from teeth or hoarseness   RESPIRATORY: no cough, SOB, DOE, wheezing, hemoptysis CARDIAC: No chest pain, edema or palpitations GI: No abdominal pain, diarrhea, constipation, heart burn, nausea or vomiting GU: Denies dysuria, frequency, hematuria, incontinence, or discharge NEUROLOGICAL: Denies dizziness, syncope, numbness, or headache PSYCHIATRIC: Denies feelings of depression or anxiety. No report of hallucinations, insomnia, paranoia, or agitation   Immunization History  Administered Date(s) Administered   Fluad Quad(high Dose 65+) 09/01/2020   Influenza Split 10/11/2010, 07/11/2012, 08/11/2013   Influenza, High Dose Seasonal PF 09/10/2014, 08/12/2015, 08/25/2017, 07/02/2019   Influenza, Seasonal, Injecte, Preservative Fre 07/25/2009, 07/16/2010, 09/13/2012, 08/02/2013, 07/21/2016    Influenza-Unspecified 08/31/1996, 09/02/1998, 08/06/1999, 08/25/2000, 08/06/2002, 09/16/2003, 09/01/2004, 08/11/2005, 08/04/2006, 07/06/2007, 07/25/2008, 07/08/2011, 07/11/2014, 08/04/2018   Moderna Sars-Covid-2 Vaccination 11/23/2019, 12/21/2019   Pneumococcal Conjugate-13 08/23/2016   Pneumococcal Polysaccharide-23 12/18/1996, 10/12/2007   Td 07/25/2009   Tdap 07/25/2009   Tetanus 12/18/1996   Zoster Recombinat (Shingrix) 05/21/2020, 03/02/2021   Zoster, Live 03/07/2012   Pertinent  Health Maintenance Due  Topic Date Due   FOOT EXAM  Never done   OPHTHALMOLOGY EXAM  Never done   URINE MICROALBUMIN  Never done   INFLUENZA VACCINE  05/11/2021   HEMOGLOBIN A1C  09/10/2021   PNA vac Low Risk Adult  Completed   No flowsheet data found.   Vitals:   03/17/21 1043  BP: 134/61  Pulse: (!) 54  Resp: 20  Temp: (!) 97.5 F (36.4 C)  Weight: 204 lb (92.5 kg)  Height: 6' (1.829 m)   Body mass index is 27.67 kg/m.  Physical Exam  GENERAL APPEARANCE: Well nourished. In no acute distress. Obese. SKIN:  Skin is warm and dry.  MOUTH and THROAT: Lips are without lesions. Oral mucosa is moist and without lesions. Tongue is normal in shape, size, and color and without lesions RESPIRATORY: Breathing is even & unlabored, BS CTAB CARDIAC: RRR, no murmur,no extra heart sounds, no edema GI: Abdomen soft, normal BS, no masses, no tenderness, NEUROLOGICAL: There is no tremor. Speech is clear. PSYCHIATRIC:  Affect and behavior are appropriate  Labs reviewed: Recent Labs    03/10/21 1029 03/11/21 1342 03/12/21 0332  NA 135 134* 136  K 5.0 5.2* 4.7  CL 103 105 103  CO2 22 22 23   GLUCOSE 194* 315* 170*  BUN 156* 145* 139*  CREATININE 2.98* 2.93* 2.69*  CALCIUM 8.8* 8.6* 8.5*  MG 2.1  --   --    Recent Labs    09/12/20 1226 03/09/21 1621 03/10/21 1029  AST 17 20 25   ALT 16 17 19   ALKPHOS 122* 73 60  BILITOT 1.0 1.1 1.3*  PROT 7.6 6.9 6.5  ALBUMIN 4.0 3.2* 3.1*   Recent Labs     03/09/21 1621 03/10/21 1029 03/12/21 1546 03/13/21 0905 03/14/21 0241  WBC 7.6   < > 5.7 6.0 6.6  NEUTROABS 5.3  --   --   --   --   HGB 6.1*   < > 7.7* 8.0* 8.0*  HCT 20.8*   < > 24.5* 26.2* 26.1*  MCV 101.5*   < > 92.8 95.6 94.6  PLT 86*   < > 50* 45* 56*   < > = values in this interval not displayed.   Lab Results  Component Value Date   TSH 3.108 03/10/2021   Lab Results  Component Value Date   HGBA1C 6.7 (H) 03/11/2021   Lab Results  Component Value Date   CHOL 72 (L) 09/12/2020   HDL 40 09/12/2020   LDLCALC 19 09/12/2020   LDLDIRECT 29.8 08/06/2013   TRIG 49 09/12/2020   CHOLHDL 1.8 09/12/2020    Significant Diagnostic Results in last 30 days:  DG Ribs Unilateral W/Chest Right  Result Date: 03/09/2021 CLINICAL DATA:  Fall EXAM: RIGHT RIBS AND CHEST - 3+ VIEW COMPARISON:  October 15, 2017 FINDINGS: The cardiomediastinal silhouette is unchanged and enlarged in contour.Status post median sternotomy and CABG. Atherosclerotic calcifications. No pleural effusion. No pneumothorax. Similar appearance of coarse bibasilar reticular opacities. Surgical sutures and clips project over the RIGHT abdomen. Multilevel degenerative changes of the thoracic spine. Contour deformity of the RIGHT anterior eighth rib consistent with subacute to chronic prior fracture. No acute displaced rib fracture. IMPRESSION: 1. Subacute to chronic rib fracture of the RIGHT anterior eighth rib. Recommend correlation with point tenderness. 2. Bibasilar coarse reticular opacities likely reflecting underlying pulmonary fibrosis. Electronically Signed   By: Valentino Saxon MD   On: 03/09/2021 17:04     Assessment/Plan  1. Acute upper  GI bleed -  GI was consulted GI was consulted and he was a started on IV Protonix.  EGD showed small antral ulcers with erosive duodenitis.  His Protonix was transitioned to p.o. twice a day  2. Acute kidney injury superimposed on CKD [name]) Lab Results  Component Value  Date   NA 136 03/12/2021   K 4.7 03/12/2021   CO2 23 03/12/2021   GLUCOSE 170 (H) 03/12/2021   BUN 139 (H) 03/12/2021   CREATININE 2.69 (H) 03/12/2021   CALCIUM 8.5 (L) 03/12/2021   GFRNONAA 22 (L) 03/12/2021   GFRAA 30 (L) 09/23/2020   -   will monitor  3. Diabetes mellitus, type II, insulin dependent (HCC) Lab Results  Component Value Date   HGBA1C 6.7 (H) 03/11/2021   -Continue NovoLog Mix 70-3030 units SQ BID  4. Acute on chronic blood loss anemia Lab Results  Component Value Date   HGB 8.0 (L) 03/14/2021   -   S/P transfusion of 4 units PRBC -Continue ferrous sulfate 325 mg daily on MWF  5. Chronic combined systolic and diastolic heart failure (HCC) -  no SOB, continue Lasix 40 mg 1 tab daily and Lasix 40 mg daily PRN for weight gain/edema  6. Physical deconditioning -   For PT and OT, for therapeutic strengthening exercises   Family/ staff Communication: Discussed plan of care with resident and charge nurse.  Labs/tests ordered: None  Goals of care:   Short-term care   Durenda Age, DNP, MSN, FNP-BC [facility] [phone] (Monday-Friday 8:00 a.m. - 5:00 p.m.) [phone] (after hours)

## 2021-03-18 MED ORDER — OXYCODONE-ACETAMINOPHEN 5-325 MG PO TABS: 1.0000 | ORAL_TABLET | Freq: Four times a day (QID) | ORAL | 0 refills | Status: DC | PRN

## 2021-03-20 ENCOUNTER — Encounter: Payer: Self-pay | Admitting: Internal Medicine

## 2021-03-20 DIAGNOSIS — J8410 Pulmonary fibrosis, unspecified: Secondary | ICD-10-CM | POA: Insufficient documentation

## 2021-03-23 ENCOUNTER — Encounter: Payer: Self-pay | Admitting: Internal Medicine

## 2021-03-23 ENCOUNTER — Non-Acute Institutional Stay (SKILLED_NURSING_FACILITY): Payer: Medicare HMO | Admitting: Internal Medicine

## 2021-03-23 DIAGNOSIS — K922 Gastrointestinal hemorrhage, unspecified: Secondary | ICD-10-CM | POA: Diagnosis not present

## 2021-03-23 DIAGNOSIS — N179 Acute kidney failure, unspecified: Secondary | ICD-10-CM

## 2021-03-23 DIAGNOSIS — J8410 Pulmonary fibrosis, unspecified: Secondary | ICD-10-CM | POA: Diagnosis not present

## 2021-03-23 DIAGNOSIS — N189 Chronic kidney disease, unspecified: Secondary | ICD-10-CM

## 2021-03-23 DIAGNOSIS — Z794 Long term (current) use of insulin: Secondary | ICD-10-CM

## 2021-03-23 DIAGNOSIS — I5042 Chronic combined systolic (congestive) and diastolic (congestive) heart failure: Secondary | ICD-10-CM | POA: Diagnosis not present

## 2021-03-23 DIAGNOSIS — E119 Type 2 diabetes mellitus without complications: Secondary | ICD-10-CM

## 2021-03-23 NOTE — Progress Notes (Signed)
NURSING HOME LOCATION:  [facility] ROOM NUMBER:   [id number]  CODE STATUS:  DNR  PCP:  Reubin Milan MD  This is a comprehensive admission note to this SNFperformed on this date less than 30 days from date of admission. Included are preadmission medical/surgical history; reconciled medication list; family history; social history and comprehensive review of systems.  Corrections and additions to the records were documented. Comprehensive physical exam was also performed. Additionally a clinical summary was entered for each active diagnosis pertinent to this admission in the Problem List to enhance continuity of care.  HPI: He was hospitalized 5/30 - 03/16/2021 presenting with shortness of breath over the previous 48 hours and melena over 72 hours.  He had been on iron supplement for several months without dark stool until 5/27. In the ED H/H  were 6.1/20.8; he was transfused 4 units of packed cells with stabilization of the hemoglobin.  Baseline hemoglobin was felt to be 10-12. The profound anemia was in the setting of history of chronic thrombocytopenia and GERD for which he takes a PPI.  He had been on prophylactic baby aspirin but denied any NSAIDs ingestion. EGD revealed small antral ulcers and erosive gastritis.  IV Protonix was transitioned to twice daily Protonix p.o. At presentation BUN was 157 and creatinine 3.06 in the context of CKD stage III.  Clinically this was attributed to hypotension in the context of profound anemia.  His creatinine baseline was felt to be 2.2-2.6. Course was complicated by hypoxic chronic respiratory failure.  An incidental finding on chest x-ray was interstitial changes suggestive of pulmonary fibrosis. Course was complicated by bradycardia which did not require intervention.   During the hospitalization glucoses ranged from 125 up to 315.  The latter was an outlier.  A1c was 6.7%.   PMH includes a diagnosis of chronic combined systolic and  diastolic congestive heart failure; BNP was 797.  In April 2021 ejection fraction was 35%. Hyperkalemia resolved. Although dramatically improved , SNF placement was recommended by PT/OT due to debilitation.  Past medical and surgical history: Includes insulin-dependent diabetes, essential hypertension, CKD stage III, history of gout, chronic heart failure, history of acute biliary pancreatitis, BPH,PVD,CAD with history of MI, OSA, and iron deficiency anemia in the setting of TCP. Surgeries include colonoscopy approximately 2012; CABG X 3 in 1997; toe amputation X2; cholecystectomy;and partial colectomy for malignancy.  Social history: Social drinker; former smoker.  Family history: Noncontributory due to advanced age.   Review of systems: He gave the date as "June 8, 22".  When asked why he had been in the hospital his response was "platelets dropped to 7, could not walk".  When asked what was done he said "not much".  He did realize that he had ulcers in his stomach.  At this time he validates having a "little heartburn".  He also describes dysphagia for several weeks.  Speech Therapy follow-up here at the SNF is pending.  He describes chronic nausea without vomiting.  He states that the bowel movement volume has decreased and it remains black. In reference to bleeding dyscrasias he describes occasional epistaxis. His major complaint is "fluid in the legs since going to the hospital".  He has no associated cardiopulmonary symptoms such as paroxysmal nocturnal dyspnea. He is compliant with his CPAP. He has numbness and tingling in the upper extremities, especially the hands.  He has chronic pain in the feet and legs in the context of a history of gout.  As noted  he has had 2 toes amputated.  Constitutional: No fever, significant weight change  Eyes: No redness, discharge, pain, vision change ENT/mouth: No nasal congestion, purulent discharge, earache, change in hearing, sore throat   Cardiovascular: No chest pain, palpitations, paroxysmal nocturnal dyspnea, claudication Respiratory: No cough, sputum production, hemoptysis, DOE, significant snoring Gastrointestinal: No abdominal pain, rectal bleeding Genitourinary: No dysuria, hematuria, pyuria, incontinence, nocturia Dermatologic: No rash, pruritus, change in appearance of skin Neurologic: No dizziness, headache, syncope, seizures Psychiatric: No significant anxiety, depression, insomnia, anorexia Endocrine: No change in hair/skin/nails, excessive thirst, excessive hunger, excessive urination  Hematologic/lymphatic: No significant bruising, lymphadenopathy Allergy/immunology: No itchy/watery eyes, significant sneezing, urticaria, angioedema  Physical exam:  Pertinent or positive findings: He appears his age and somewhat debilitated.  Pattern alopecia is present with thinning of the hair over the anterior scalp.  He has an upper plate.  He is missing mandibular teeth but not wearing his partial.  Heart sounds are distant with slight increase in the second heart sound.  He has minor bibasilar rales.  Abdomen is protuberant with a very long vertical midline op scar.  There is evidence of herniation on either side of the scar.  Pedal pulses are decreased.  He has 1/2+ pitting edema.  Stasis dermatitis changes are present over the ankles,especially the left.  The extremities are weak to opposition.  General appearance:  no acute distress, increased work of breathing is present.   Lymphatic: No lymphadenopathy about the head, neck, axilla. Eyes: No conjunctival inflammation or lid edema is present. There is no scleral icterus. Ears:  External ear exam shows no significant lesions or deformities.   Nose:  External nasal examination shows no deformity or inflammation. Nasal mucosa are pink and moist without lesions, exudates Oral exam: Lips and gums are healthy appearing.There is no oropharyngeal erythema or exudate. Neck:  No  thyromegaly, masses, tenderness noted.    Heart:  Normal rate and regular rhythm. S1 normal without gallop, murmur, click, rub.  Lungs:  without wheezes, rhonchi,  rubs. Abdomen: Bowel sounds are normal.  Abdomen is soft and nontender with no organomegaly, masses. GU: Deferred  Extremities:  No cyanosis, clubbing. Neurologic exam:  Balance, [place], finger to nose testing could not be completed due to clinical state Skin: Warm & dry w/o tenting. No significant lesions .  See clinical summary under each active problem in the Problem List with associated updated therapeutic plan

## 2021-03-24 NOTE — Assessment & Plan Note (Signed)
OP F/U with full PFTs including DLCO recommended post SNF

## 2021-03-24 NOTE — Assessment & Plan Note (Addendum)
1/2+ pitting edema w/o frank CHF. Diuresis as tolerated in context of CKD Stage 3. No current BNP in Epic; recheck if CHF suspect.

## 2021-03-24 NOTE — Assessment & Plan Note (Signed)
Baseline CKD Stage 3B Medication List reviewed;on low dose Allopurinol  If CKD progresses ,Allopurinol will be weaned or discontinued.

## 2021-03-24 NOTE — Patient Instructions (Signed)
See assessment and plan under each diagnosis in the problem list and acutely for this visit 

## 2021-03-24 NOTE — Assessment & Plan Note (Signed)
BID 70/30 and Novolog 5 u if glucose > 200. A1c goal = < 8%.

## 2021-03-24 NOTE — Assessment & Plan Note (Signed)
Symptomatically much improved w residual mild dyspepsia,nausea & dysphagia. Speech Therapy consult @ SNF

## 2021-03-30 LAB — CBC AND DIFFERENTIAL
HCT: 29 — AB (ref 41–53)
Hemoglobin: 9 — AB (ref 13.5–17.5)
Platelets: 62 — AB (ref 150–399)
WBC: 4.5

## 2021-03-30 LAB — BASIC METABOLIC PANEL
BUN: 72 — AB (ref 4–21)
CO2: 28 — AB (ref 13–22)
Chloride: 103 (ref 99–108)
Creatinine: 2.9 — AB (ref 0.6–1.3)
Glucose: 195
Potassium: 5.6 — AB (ref 3.4–5.3)
Sodium: 138 (ref 137–147)

## 2021-03-30 LAB — CBC: RBC: 3.06 — AB (ref 3.87–5.11)

## 2021-03-30 LAB — COMPREHENSIVE METABOLIC PANEL
Calcium: 8.8 (ref 8.7–10.7)
GFR calc Af Amer: 21.4
GFR calc non Af Amer: 18.47

## 2021-03-31 ENCOUNTER — Other Ambulatory Visit: Payer: Self-pay | Admitting: Adult Health

## 2021-03-31 LAB — COMPREHENSIVE METABOLIC PANEL
Calcium: 8.7 (ref 8.7–10.7)
GFR calc Af Amer: 21.85
GFR calc non Af Amer: 18.86

## 2021-03-31 LAB — BASIC METABOLIC PANEL
BUN: 69 — AB (ref 4–21)
CO2: 26 — AB (ref 13–22)
Chloride: 102 (ref 99–108)
Creatinine: 2.9 — AB (ref 0.6–1.3)
Glucose: 128
Potassium: 5.1 (ref 3.4–5.3)
Sodium: 139 (ref 137–147)

## 2021-03-31 MED ORDER — OXYCODONE-ACETAMINOPHEN 5-325 MG PO TABS: 1.0000 | ORAL_TABLET | Freq: Four times a day (QID) | ORAL | 0 refills | Status: AC | PRN

## 2021-04-03 ENCOUNTER — Non-Acute Institutional Stay (SKILLED_NURSING_FACILITY): Payer: Medicare HMO | Admitting: Internal Medicine

## 2021-04-03 ENCOUNTER — Encounter: Payer: Self-pay | Admitting: Internal Medicine

## 2021-04-03 DIAGNOSIS — N183 Chronic kidney disease, stage 3 unspecified: Secondary | ICD-10-CM | POA: Diagnosis not present

## 2021-04-03 DIAGNOSIS — J8410 Pulmonary fibrosis, unspecified: Secondary | ICD-10-CM

## 2021-04-03 DIAGNOSIS — Z794 Long term (current) use of insulin: Secondary | ICD-10-CM

## 2021-04-03 DIAGNOSIS — I5042 Chronic combined systolic (congestive) and diastolic (congestive) heart failure: Secondary | ICD-10-CM | POA: Diagnosis not present

## 2021-04-03 DIAGNOSIS — K922 Gastrointestinal hemorrhage, unspecified: Secondary | ICD-10-CM

## 2021-04-03 DIAGNOSIS — L03115 Cellulitis of right lower limb: Secondary | ICD-10-CM

## 2021-04-03 DIAGNOSIS — L0390 Cellulitis, unspecified: Secondary | ICD-10-CM | POA: Insufficient documentation

## 2021-04-03 DIAGNOSIS — E119 Type 2 diabetes mellitus without complications: Secondary | ICD-10-CM

## 2021-04-03 DIAGNOSIS — R072 Precordial pain: Secondary | ICD-10-CM

## 2021-04-03 NOTE — Assessment & Plan Note (Addendum)
03/31/21 CKD Stage 4 w creatinine 2.9 & GFR 19 Medication List reviewed; Allopurinol dose should be 50 mg every other day.  This was discussed with him and written on the med list.

## 2021-04-03 NOTE — Assessment & Plan Note (Signed)
Pulmonary referral deferred to PCP.

## 2021-04-03 NOTE — Assessment & Plan Note (Signed)
He was given a prescription for 500 mg 3 times daily x5 days. He will be seen by his PCP 6/27

## 2021-04-03 NOTE — Progress Notes (Signed)
NURSING HOME LOCATION:  [facility] ROOM NUMBER:  [id number]  CODE STATUS:  DNR  PCP:  Reubin Milan MD  The patient is a discharge summary from [facility] by Unice Cobble MD.Tentative discharge is Saturday 04/04/2021.  The medical history in this facility was reviewed and summarized and medical problem list was updated. Time spent and note content is documented as follows.  Summary of [facility] medical records: The patient was hospitalized 5/30 - 03/16/2021 having presented with shortness of breath over a 48-hour period with melena over 72 hours PTA.  The melena was in the context of having been on oral iron supplement for several months without change in stool color of significance.  In the ED H/H were 6.1/20.8 for which he received 4 units PRBCs with hemoglobin stabilization.  His baseline hemoglobin is felt to be in the range of 10-12. The profound anemia is in the context of chronic thrombocytopenia and GERD.  He had been on a PPI for the latter.  Additionally he had been on prophylactic aspirin.  He denied any nonsteroidal ingestion. EGD did reveal antral ulcers and erosive gastritis.  Initial IV Protonix was transitioned to twice daily Protonix orally. Course was complicated by AKI superimposed on chronic stage III CKD.  BUN was 157 and creatinine 3.06 at admission.  This was attributed to hypotension related to the profound anemia.  Baseline creatinine is felt to be 2.2-2.6. Course was also complicated by hypoxic respiratory failure.  Chest x-ray revealed interstitial changes suggestive of pulmonary fibrosis. Additionally had bradycardia which was transitory and did not require intervention. He received sliding scale insulin for diabetes; glucoses ranged from 125 up to 315.  The latter was an outlier.  A1c indicated good control with a value of 6.7%.  Again this was in the context of CKD raising the question of possible discordance as a factor. Significant past  history includes combined systolic diastolic congestive heart failure.  During this admission BNP peak was 797.  In April 2021 EF was documented as 35%. The AKI and profound azotemia were associated with hyperkalemia which was corrected. He was discharged on 6/6 to this SNF for rehab.  Here at the SNF he was prescribed Keflex 500 mg 3 times daily for RLE cellulitis.  The SNF Wound Care Nurse has been monitoring him. On 6/4 H/H 8/26.1; on 6/20 here @  SNF H/H 9/28.6.  No active bleeding dyscrasias reported. While hospitalized the creatinine peaked at 3.06 with a GFR of 19 indicating CKD stage IV.  At discharge from hospital creatinine was 2.69 and GFR 22.  The most recent creatinine was 6/21 with a value of 2.9 and GFR 22 indicating stable stage IV CKD. He has been on low-dose allopurinol.  Review of systems: Pertinent or active symptoms include: He states that he has gained 18 pounds since his been in the SNF.  An extra dose of Lasix 40 mg was given w any weight gain of > 2 lbs/ day @ SNF. He describes some intermittent paroxysmal nocturnal dyspnea. As he described the shortness of breath he held his protuberant abdomen & stated "it is no surprise".  He feels that his peripheral edema is stable. He has dysphagia w/o other GI symptoms except chronic constipation. This is in the context of taking a probiotic.  Negative ROS: Constitutional: No fever,chills Eyes: No redness, discharge, pain, vision change ENT/mouth: No nasal congestion, purulent discharge, earache, change in hearing, sore throat  Cardiovascular: No chest pain, palpitations, claudication Respiratory:  No cough, sputum production, hemoptysis, DOE, significant snoring, apnea   Gastrointestinal: No heartburn, abdominal pain, nausea /vomiting, rectal bleeding, melena, Genitourinary: No dysuria, hematuria, pyuria, incontinence, nocturia Musculoskeletal: No joint stiffness, joint swelling, weakness, pain Dermatologic: No rash, pruritus,  change in appearance of skin Neurologic: No dizziness, headache, syncope, seizures, numbness, tingling Psychiatric: No significant anxiety, depression, insomnia, anorexia Endocrine: No change in hair/skin/nails, excessive thirst, excessive hunger, excessive urination  Hematologic/lymphatic: No significant bruising, lymphadenopathy, abnormal bleeding Allergy/immunology: No itchy/watery eyes, significant sneezing, urticaria, angioedema  Physical exam:  Pertinent or positive findings: He appears chronically ill.  Pattern alopecia is present.  He is Geneticist, molecular.  Pupils are pinpoint.  He has an upper plate.  He is missing the anterior mandibular teeth.  Heart sounds are markedly distant and almost not auscultated. He has minor rales at the right lower lobe posteriorly.  Abdomen is markedly protuberant.  He has 1/2+ pitting edema of the feet.  The right lower extremity is wrapped.  There is hyperpigmentation visible over the anterior shin.  There is some weeping present of clear fluid. Pedal pulses are markedly decreased.He employs a walker to ambulate & moves very slowly & unsteadily.  General appearance: no acute distress, increased work of breathing is present.   Lymphatic: No lymphadenopathy about the head, neck, axilla. Eyes: No conjunctival inflammation or lid edema is present. There is no scleral icterus. Ears:  External ear exam shows no significant lesions or deformities.   Nose:  External nasal examination shows no deformity or inflammation. Nasal mucosa are pink and moist without lesions, exudates Oral exam: Lips and gums are healthy appearing.There is no oropharyngeal erythema or exudate. Neck:  No thyromegaly, masses, tenderness noted.    Lungs:  without wheezes, rhonchi, rubs. Abdomen: Bowel sounds are normal. Abdomen is soft and nontender with no organomegaly, hernias, masses. GU: Deferred as previously addressed. Extremities:  No cyanosis, clubbing  Neurologic exam: Balance, Rhomberg,  finger to nose testing could not be completed due to clinical state Skin: Warm ; w/o tenting.  See clinical summary of Discharge Diagnoses in the Problem List with associated updated therapeutic plan  Discharge instructions were written under Assessment & Plan. He has appointment @ 3 pm 6/27 w Dr Reubin Milan, his PCP. He was told to take med list w notations & copies of labs I gave him to that appt.

## 2021-04-03 NOTE — Assessment & Plan Note (Addendum)
He describes some paroxysmal nocturnal dyspnea.  He feels that his edema is stable.  He states that he has gained 18 pounds since he has been at the facility. I discussed sodium restriction.  I have asked him to increase his furosemide to 40 mg twice daily 6/25 and 6/26 and discuss response with his PCP when seen 6/27. PCP & Cardiology can assess Carvedilol titration. Also possible consideration is a Sodium-Glucose Cotransport 2 Inhibitor such as Empagliflozin @ 10 mg qd if GFR > 20.

## 2021-04-03 NOTE — Assessment & Plan Note (Signed)
03/14/21 H/H 8/26.1 03/30/21 H/H 9/28.6 On supplemental iron

## 2021-04-03 NOTE — Assessment & Plan Note (Addendum)
No hypoglycemia reported. Possibly consider low dose SGLT2 agent such as Empagliflozin @ 10 mg qd if GFR > 20 in context of concomitant CHF

## 2021-04-03 NOTE — Patient Instructions (Addendum)
Sodium restriction was discussed with him with a recommendation that he avoid foods that taste "salty" or "vinegary" and do not add salt at the table.  His extensive med list was reviewed with him and notes made on it.  Changes in the present regimen will be : 1)Take the furosemide twice a day on Saturday 6/25 and 6/26 until seen by Dr. Marjo Bicker 6/27 at 3 PM.   2) Allopurinol was decreased to 50 mg (100 mg 1/2 pill) Monday, Wednesday, and Friday because of stage IV CKD. 3) He was given a new prescription for Keflex 500 mg 3 times daily ,dispense 15 for the right shin cellulitis.  I discussed the risk of taking Percocet with his congestive heart failure and obstructive sleep apnea.  He has some Percocet remaining and he will have his PCP refill this if it is appropriate. No changes made in Protonix, tamsulosin, atorvastatin, baby aspirin, generic Uloric (Note:Up to Date verifies Uloric dose of 20-40 mg qd is OK w present GFR), iron supplement,insulin and glaucoma eyedrops. He is on carvedilol 6.25 mg twice daily; his PCP or Cardiologist can titrate this as clinically indicated.   He was to take Florastor only if stools are loose.  He describes constipation at this time. He will return to his home 6/25; home health has been ordered.  His brother will be helping provide care.

## 2021-04-08 ENCOUNTER — Telehealth: Payer: Self-pay | Admitting: Cardiology

## 2021-04-08 DIAGNOSIS — I5042 Chronic combined systolic (congestive) and diastolic (congestive) heart failure: Secondary | ICD-10-CM | POA: Diagnosis not present

## 2021-04-08 DIAGNOSIS — N1832 Chronic kidney disease, stage 3b: Secondary | ICD-10-CM | POA: Diagnosis not present

## 2021-04-08 DIAGNOSIS — L97829 Non-pressure chronic ulcer of other part of left lower leg with unspecified severity: Secondary | ICD-10-CM | POA: Diagnosis not present

## 2021-04-08 DIAGNOSIS — I130 Hypertensive heart and chronic kidney disease with heart failure and stage 1 through stage 4 chronic kidney disease, or unspecified chronic kidney disease: Secondary | ICD-10-CM | POA: Diagnosis not present

## 2021-04-08 DIAGNOSIS — L97819 Non-pressure chronic ulcer of other part of right lower leg with unspecified severity: Secondary | ICD-10-CM | POA: Diagnosis not present

## 2021-04-08 DIAGNOSIS — I872 Venous insufficiency (chronic) (peripheral): Secondary | ICD-10-CM | POA: Diagnosis not present

## 2021-04-08 DIAGNOSIS — D631 Anemia in chronic kidney disease: Secondary | ICD-10-CM | POA: Diagnosis not present

## 2021-04-08 DIAGNOSIS — E1151 Type 2 diabetes mellitus with diabetic peripheral angiopathy without gangrene: Secondary | ICD-10-CM | POA: Diagnosis not present

## 2021-04-08 DIAGNOSIS — E1122 Type 2 diabetes mellitus with diabetic chronic kidney disease: Secondary | ICD-10-CM | POA: Diagnosis not present

## 2021-04-08 NOTE — Telephone Encounter (Signed)
Spoke to patient-patient states he went to the hospital 3 weeks ago, was sent to rehab for 2 more weeks and went home Saturday.    He went to the [facility] Monday and Tuesday and reports they did chest xray, leg ultrasound, and blood work.   He states he got called today and was told he had fluid in his lungs and his kidneys "are not functioning real good".   They advised him to go to the ER.     He denies increased SOB or swelling in legs. Reports being able to lay flat at night.    Patient sounds to be wheezing while speaking to nurse.      Advised we are unable to see these results but would proceed to ER if they recommended he do so.   Patient verbalized understanding and will proceed to ER for evaluation.  [facility] states [facility] and information for Dr. Martinique to review.

## 2021-04-08 NOTE — Telephone Encounter (Signed)
I am happy to review records from the [place] if and when we receive them. He was admitted here recently with upper GI bleed with Hgb down to 6 and worsening renal function. He was transfused 4 units and renal function returned to baseline with creatinine 2.9. CXR at that time showed "pulmonary fibrosis" so not sure it this isn't what they are seeing at the [place].  Aston Lieske Martinique MD, [facility]

## 2021-04-08 NOTE — Telephone Encounter (Signed)
New Message:    Pt said he was seen at the [facility] yesterday and everything was supposed to be sent to Dr Martinique. He have a lot going on. He said he was told to see Dr Martinique.t

## 2021-04-08 NOTE — Telephone Encounter (Signed)
Duplicate phone note-routed to primary nurse to make aware.

## 2021-04-08 NOTE — Telephone Encounter (Signed)
New message:     Earnest Bailey from the [place] calling to let Dr Martinique know patient was sent to the hospital and will send report sent over for him view.

## 2021-04-09 ENCOUNTER — Encounter (HOSPITAL_COMMUNITY): Payer: Self-pay | Admitting: Emergency Medicine

## 2021-04-09 ENCOUNTER — Inpatient Hospital Stay (HOSPITAL_COMMUNITY)
Admission: EM | Admit: 2021-04-09 | Discharge: 2021-04-14 | DRG: 291 | Disposition: A | Discharge status: Home Health | Payer: Medicare HMO | Attending: Internal Medicine | Admitting: Internal Medicine

## 2021-04-09 ENCOUNTER — Emergency Department (HOSPITAL_COMMUNITY): Payer: Medicare HMO

## 2021-04-09 ENCOUNTER — Other Ambulatory Visit: Payer: Self-pay

## 2021-04-09 DIAGNOSIS — Z794 Long term (current) use of insulin: Secondary | ICD-10-CM | POA: Diagnosis not present

## 2021-04-09 DIAGNOSIS — E1122 Type 2 diabetes mellitus with diabetic chronic kidney disease: Secondary | ICD-10-CM | POA: Diagnosis not present

## 2021-04-09 DIAGNOSIS — I10 Essential (primary) hypertension: Secondary | ICD-10-CM | POA: Diagnosis present

## 2021-04-09 DIAGNOSIS — E1151 Type 2 diabetes mellitus with diabetic peripheral angiopathy without gangrene: Secondary | ICD-10-CM | POA: Diagnosis present

## 2021-04-09 DIAGNOSIS — Z66 Do not resuscitate: Secondary | ICD-10-CM | POA: Diagnosis not present

## 2021-04-09 DIAGNOSIS — N184 Chronic kidney disease, stage 4 (severe): Secondary | ICD-10-CM | POA: Diagnosis present

## 2021-04-09 DIAGNOSIS — I5082 Biventricular heart failure: Secondary | ICD-10-CM | POA: Diagnosis present

## 2021-04-09 DIAGNOSIS — L03115 Cellulitis of right lower limb: Secondary | ICD-10-CM | POA: Diagnosis not present

## 2021-04-09 DIAGNOSIS — D696 Thrombocytopenia, unspecified: Secondary | ICD-10-CM | POA: Diagnosis not present

## 2021-04-09 DIAGNOSIS — R21 Rash and other nonspecific skin eruption: Secondary | ICD-10-CM | POA: Diagnosis not present

## 2021-04-09 DIAGNOSIS — Z7982 Long term (current) use of aspirin: Secondary | ICD-10-CM

## 2021-04-09 DIAGNOSIS — I517 Cardiomegaly: Secondary | ICD-10-CM | POA: Diagnosis not present

## 2021-04-09 DIAGNOSIS — I5023 Acute on chronic systolic (congestive) heart failure: Secondary | ICD-10-CM | POA: Diagnosis not present

## 2021-04-09 DIAGNOSIS — I2510 Atherosclerotic heart disease of native coronary artery without angina pectoris: Secondary | ICD-10-CM | POA: Diagnosis present

## 2021-04-09 DIAGNOSIS — I440 Atrioventricular block, first degree: Secondary | ICD-10-CM | POA: Diagnosis present

## 2021-04-09 DIAGNOSIS — J811 Chronic pulmonary edema: Secondary | ICD-10-CM | POA: Diagnosis not present

## 2021-04-09 DIAGNOSIS — Z20822 Contact with and (suspected) exposure to covid-19: Secondary | ICD-10-CM | POA: Diagnosis present

## 2021-04-09 DIAGNOSIS — Z9049 Acquired absence of other specified parts of digestive tract: Secondary | ICD-10-CM

## 2021-04-09 DIAGNOSIS — I739 Peripheral vascular disease, unspecified: Secondary | ICD-10-CM | POA: Diagnosis present

## 2021-04-09 DIAGNOSIS — I5021 Acute systolic (congestive) heart failure: Secondary | ICD-10-CM | POA: Diagnosis not present

## 2021-04-09 DIAGNOSIS — I255 Ischemic cardiomyopathy: Secondary | ICD-10-CM | POA: Diagnosis present

## 2021-04-09 DIAGNOSIS — E11628 Type 2 diabetes mellitus with other skin complications: Secondary | ICD-10-CM | POA: Diagnosis present

## 2021-04-09 DIAGNOSIS — I509 Heart failure, unspecified: Secondary | ICD-10-CM | POA: Diagnosis not present

## 2021-04-09 DIAGNOSIS — E785 Hyperlipidemia, unspecified: Secondary | ICD-10-CM | POA: Diagnosis present

## 2021-04-09 DIAGNOSIS — M1990 Unspecified osteoarthritis, unspecified site: Secondary | ICD-10-CM | POA: Diagnosis present

## 2021-04-09 DIAGNOSIS — Z8249 Family history of ischemic heart disease and other diseases of the circulatory system: Secondary | ICD-10-CM

## 2021-04-09 DIAGNOSIS — M109 Gout, unspecified: Secondary | ICD-10-CM | POA: Diagnosis present

## 2021-04-09 DIAGNOSIS — L0390 Cellulitis, unspecified: Secondary | ICD-10-CM | POA: Diagnosis present

## 2021-04-09 DIAGNOSIS — Z841 Family history of disorders of kidney and ureter: Secondary | ICD-10-CM

## 2021-04-09 DIAGNOSIS — I130 Hypertensive heart and chronic kidney disease with heart failure and stage 1 through stage 4 chronic kidney disease, or unspecified chronic kidney disease: Secondary | ICD-10-CM | POA: Diagnosis not present

## 2021-04-09 DIAGNOSIS — Z951 Presence of aortocoronary bypass graft: Secondary | ICD-10-CM

## 2021-04-09 DIAGNOSIS — J90 Pleural effusion, not elsewhere classified: Secondary | ICD-10-CM | POA: Diagnosis not present

## 2021-04-09 DIAGNOSIS — Z79899 Other long term (current) drug therapy: Secondary | ICD-10-CM

## 2021-04-09 DIAGNOSIS — K219 Gastro-esophageal reflux disease without esophagitis: Secondary | ICD-10-CM | POA: Diagnosis present

## 2021-04-09 DIAGNOSIS — Z85038 Personal history of other malignant neoplasm of large intestine: Secondary | ICD-10-CM

## 2021-04-09 DIAGNOSIS — I2722 Pulmonary hypertension due to left heart disease: Secondary | ICD-10-CM | POA: Diagnosis not present

## 2021-04-09 DIAGNOSIS — I252 Old myocardial infarction: Secondary | ICD-10-CM

## 2021-04-09 DIAGNOSIS — G4733 Obstructive sleep apnea (adult) (pediatric): Secondary | ICD-10-CM | POA: Diagnosis present

## 2021-04-09 DIAGNOSIS — R0602 Shortness of breath: Secondary | ICD-10-CM | POA: Diagnosis not present

## 2021-04-09 DIAGNOSIS — E119 Type 2 diabetes mellitus without complications: Secondary | ICD-10-CM | POA: Diagnosis not present

## 2021-04-09 DIAGNOSIS — Z87440 Personal history of urinary (tract) infections: Secondary | ICD-10-CM

## 2021-04-09 DIAGNOSIS — Z89421 Acquired absence of other right toe(s): Secondary | ICD-10-CM

## 2021-04-09 DIAGNOSIS — I110 Hypertensive heart disease with heart failure: Secondary | ICD-10-CM | POA: Diagnosis not present

## 2021-04-09 DIAGNOSIS — D509 Iron deficiency anemia, unspecified: Secondary | ICD-10-CM | POA: Diagnosis present

## 2021-04-09 LAB — CBC WITH DIFFERENTIAL/PLATELET
Abs Immature Granulocytes: 0.03 10*3/uL (ref 0.00–0.07)
Basophils Absolute: 0.1 10*3/uL (ref 0.0–0.1)
Basophils Relative: 1 %
Eosinophils Absolute: 0.2 10*3/uL (ref 0.0–0.5)
Eosinophils Relative: 3 %
HCT: 32 % — ABNORMAL LOW (ref 39.0–52.0)
Hemoglobin: 9.3 g/dL — ABNORMAL LOW (ref 13.0–17.0)
Immature Granulocytes: 0 %
Lymphocytes Relative: 13 %
Lymphs Abs: 0.9 10*3/uL (ref 0.7–4.0)
MCH: 29.1 pg (ref 26.0–34.0)
MCHC: 29.1 g/dL — ABNORMAL LOW (ref 30.0–36.0)
MCV: 100 fL (ref 80.0–100.0)
Monocytes Absolute: 1 10*3/uL (ref 0.1–1.0)
Monocytes Relative: 14 %
Neutro Abs: 4.7 10*3/uL (ref 1.7–7.7)
Neutrophils Relative %: 69 %
Platelets: 73 10*3/uL — ABNORMAL LOW (ref 150–400)
RBC: 3.2 MIL/uL — ABNORMAL LOW (ref 4.22–5.81)
RDW: 17.5 % — ABNORMAL HIGH (ref 11.5–15.5)
WBC: 6.8 10*3/uL (ref 4.0–10.5)
nRBC: 0 % (ref 0.0–0.2)

## 2021-04-09 LAB — COMPREHENSIVE METABOLIC PANEL
ALT: 11 U/L (ref 0–44)
AST: 30 U/L (ref 15–41)
Albumin: 3 g/dL — ABNORMAL LOW (ref 3.5–5.0)
Alkaline Phosphatase: 89 U/L (ref 38–126)
Anion gap: 9 (ref 5–15)
BUN: 73 mg/dL — ABNORMAL HIGH (ref 8–23)
CO2: 24 mmol/L (ref 22–32)
Calcium: 8.6 mg/dL — ABNORMAL LOW (ref 8.9–10.3)
Chloride: 101 mmol/L (ref 98–111)
Creatinine, Ser: 3.23 mg/dL — ABNORMAL HIGH (ref 0.61–1.24)
GFR, Estimated: 18 mL/min — ABNORMAL LOW (ref >60)
Glucose, Bld: 115 mg/dL — ABNORMAL HIGH (ref 70–99)
Potassium: 5 mmol/L (ref 3.5–5.1)
Sodium: 134 mmol/L — ABNORMAL LOW (ref 135–145)
Total Bilirubin: 1.2 mg/dL (ref 0.3–1.2)
Total Protein: 6.8 g/dL (ref 6.5–8.1)

## 2021-04-09 LAB — RESP PANEL BY RT-PCR (FLU A&B, COVID) ARPGX2
Influenza A by PCR: NEGATIVE
Influenza B by PCR: NEGATIVE
SARS Coronavirus 2 by RT PCR: NEGATIVE

## 2021-04-09 LAB — TROPONIN I (HIGH SENSITIVITY)
Troponin I (High Sensitivity): 35 ng/L — ABNORMAL HIGH (ref <18)
Troponin I (High Sensitivity): 33 ng/L — ABNORMAL HIGH (ref <18)

## 2021-04-09 LAB — CBG MONITORING, ED: Glucose-Capillary: 134 mg/dL — ABNORMAL HIGH (ref 70–99)

## 2021-04-09 LAB — BRAIN NATRIURETIC PEPTIDE: B Natriuretic Peptide: 2193.6 pg/mL — ABNORMAL HIGH (ref 0.0–100.0)

## 2021-04-09 LAB — URIC ACID: Uric Acid, Serum: 6.7 mg/dL (ref 3.7–8.6)

## 2021-04-09 MED ORDER — HEPARIN SODIUM (PORCINE) 5000 UNIT/ML IJ SOLN: 5000.0000 [IU] | Freq: Two times a day (BID) | INTRAMUSCULAR | Status: DC

## 2021-04-09 MED ORDER — VITAMIN D 25 MCG (1000 UNIT) PO TABS
2000.0000 [IU] | ORAL_TABLET | ORAL | Status: DC
  Administered 2021-04-10 – 2021-04-13 (×2): 2000 [IU] via ORAL
  Filled 2021-04-09 (×3): qty 2

## 2021-04-09 MED ORDER — INSULIN ASPART 100 UNIT/ML IJ SOLN
0.0000 [IU] | Freq: Three times a day (TID) | INTRAMUSCULAR | Status: DC
  Administered 2021-04-10: 1 [IU] via SUBCUTANEOUS
  Administered 2021-04-10: 2 [IU] via SUBCUTANEOUS
  Administered 2021-04-10: 3 [IU] via SUBCUTANEOUS
  Administered 2021-04-11: 2 [IU] via SUBCUTANEOUS
  Administered 2021-04-11 – 2021-04-12 (×4): 1 [IU] via SUBCUTANEOUS

## 2021-04-09 MED ORDER — SODIUM CHLORIDE 0.9% FLUSH
3.0000 mL | Freq: Two times a day (BID) | INTRAVENOUS | Status: DC
  Administered 2021-04-09 – 2021-04-14 (×10): 3 mL via INTRAVENOUS

## 2021-04-09 MED ORDER — CARVEDILOL 3.125 MG PO TABS
3.1250 mg | ORAL_TABLET | Freq: Two times a day (BID) | ORAL | Status: DC
  Administered 2021-04-09: 3.125 mg via ORAL
  Filled 2021-04-09 (×2): qty 1

## 2021-04-09 MED ORDER — ACETAMINOPHEN 325 MG PO TABS
650.0000 mg | ORAL_TABLET | ORAL | Status: DC | PRN
  Administered 2021-04-11: 650 mg via ORAL
  Filled 2021-04-09: qty 2

## 2021-04-09 MED ORDER — COLCHICINE 0.6 MG PO TABS
0.6000 mg | ORAL_TABLET | ORAL | Status: DC | PRN
  Administered 2021-04-12: 1.2 mg via ORAL
  Administered 2021-04-12: 0.6 mg via ORAL
  Filled 2021-04-09 (×2): qty 1
  Filled 2021-04-09 (×2): qty 2

## 2021-04-09 MED ORDER — SODIUM CHLORIDE 0.9 % IV SOLN: 250.0000 mL | INTRAVENOUS | Status: DC | PRN

## 2021-04-09 MED ORDER — ONDANSETRON HCL 4 MG/2ML IJ SOLN
4.0000 mg | Freq: Four times a day (QID) | INTRAMUSCULAR | Status: DC | PRN
  Administered 2021-04-13: 4 mg via INTRAVENOUS
  Filled 2021-04-09: qty 2

## 2021-04-09 MED ORDER — SODIUM CHLORIDE 0.9% FLUSH: 3.0000 mL | INTRAVENOUS | Status: DC | PRN

## 2021-04-09 MED ORDER — FERROUS SULFATE 325 (65 FE) MG PO TABS
325.0000 mg | ORAL_TABLET | ORAL | Status: DC
  Administered 2021-04-10 – 2021-04-13 (×2): 325 mg via ORAL
  Filled 2021-04-09 (×3): qty 1

## 2021-04-09 MED ORDER — TAMSULOSIN HCL 0.4 MG PO CAPS
0.4000 mg | ORAL_CAPSULE | Freq: Every day | ORAL | Status: DC
  Administered 2021-04-09 – 2021-04-13 (×5): 0.4 mg via ORAL
  Filled 2021-04-09 (×5): qty 1

## 2021-04-09 MED ORDER — BISMUTH SUBSALICYLATE 262 MG/15ML PO SUSP: 30.0000 mL | Freq: Four times a day (QID) | ORAL | Status: DC | PRN

## 2021-04-09 MED ORDER — PANTOPRAZOLE SODIUM 40 MG PO TBEC
40.0000 mg | DELAYED_RELEASE_TABLET | Freq: Two times a day (BID) | ORAL | Status: DC
  Administered 2021-04-09 – 2021-04-14 (×10): 40 mg via ORAL
  Filled 2021-04-09 (×10): qty 1

## 2021-04-09 MED ORDER — FUROSEMIDE 10 MG/ML IJ SOLN
40.0000 mg | Freq: Two times a day (BID) | INTRAMUSCULAR | Status: DC
  Administered 2021-04-10: 40 mg via INTRAVENOUS
  Filled 2021-04-09: qty 4

## 2021-04-09 MED ORDER — MAGNESIUM HYDROXIDE 400 MG/5ML PO SUSP: 30.0000 mL | Freq: Every day | ORAL | Status: DC | PRN

## 2021-04-09 MED ORDER — ADULT MULTIVITAMIN W/MINERALS CH
1.0000 | ORAL_TABLET | Freq: Every day | ORAL | Status: DC
  Administered 2021-04-10 – 2021-04-14 (×5): 1 via ORAL
  Filled 2021-04-09 (×5): qty 1

## 2021-04-09 MED ORDER — POLYETHYLENE GLYCOL 3350 17 G PO PACK
17.0000 g | PACK | Freq: Every day | ORAL | Status: DC | PRN
  Administered 2021-04-13: 17 g via ORAL
  Filled 2021-04-09: qty 1

## 2021-04-09 MED ORDER — ATORVASTATIN CALCIUM 10 MG PO TABS
20.0000 mg | ORAL_TABLET | Freq: Every day | ORAL | Status: DC
  Administered 2021-04-10 – 2021-04-14 (×5): 20 mg via ORAL
  Filled 2021-04-09 (×5): qty 2

## 2021-04-09 MED ORDER — FEBUXOSTAT 40 MG PO TABS
40.0000 mg | ORAL_TABLET | Freq: Every morning | ORAL | Status: DC
  Administered 2021-04-10 – 2021-04-14 (×5): 40 mg via ORAL
  Filled 2021-04-09 (×5): qty 1

## 2021-04-09 MED ORDER — ALLOPURINOL 100 MG PO TABS
100.0000 mg | ORAL_TABLET | Freq: Every day | ORAL | Status: DC
  Administered 2021-04-10 – 2021-04-14 (×5): 100 mg via ORAL
  Filled 2021-04-09 (×5): qty 1

## 2021-04-09 MED ORDER — LATANOPROST 0.005 % OP SOLN
1.0000 [drp] | Freq: Every day | OPHTHALMIC | Status: DC
  Administered 2021-04-12 – 2021-04-13 (×3): 1 [drp] via OPHTHALMIC
  Filled 2021-04-09 (×3): qty 2.5

## 2021-04-09 MED ORDER — ASPIRIN EC 81 MG PO TBEC
81.0000 mg | DELAYED_RELEASE_TABLET | Freq: Every day | ORAL | Status: DC
  Administered 2021-04-09 – 2021-04-13 (×5): 81 mg via ORAL
  Filled 2021-04-09 (×5): qty 1

## 2021-04-09 MED ORDER — OXYCODONE-ACETAMINOPHEN 5-325 MG PO TABS
1.0000 | ORAL_TABLET | Freq: Four times a day (QID) | ORAL | Status: DC | PRN
  Administered 2021-04-09 – 2021-04-14 (×13): 1 via ORAL
  Filled 2021-04-09 (×14): qty 1

## 2021-04-09 MED ORDER — BISACODYL 10 MG RE SUPP
10.0000 mg | RECTAL | Status: DC | PRN
  Administered 2021-04-14: 10 mg via RECTAL
  Filled 2021-04-09: qty 1

## 2021-04-09 MED ORDER — SACCHAROMYCES BOULARDII 250 MG PO CAPS
250.0000 mg | ORAL_CAPSULE | Freq: Two times a day (BID) | ORAL | Status: DC
  Administered 2021-04-09 – 2021-04-14 (×10): 250 mg via ORAL
  Filled 2021-04-09 (×11): qty 1

## 2021-04-09 NOTE — Progress Notes (Signed)
Auto CPAP (Min 5-Max 19) with ffm, 21% set up at bedside and pt placed on by this RT. Advised pt to notify for RT if any further assistance needed. RT will continue to monitor.

## 2021-04-09 NOTE — ED Provider Notes (Signed)
[facility] EMERGENCY DEPARTMENT Provider Note   CSN: [id number] Arrival date & time: 04/09/21  1150     History Chief Complaint  Patient presents with   Shortness of Breath    Scott [name] is a 87 y.o. male.   Shortness of Breath Associated symptoms: no chest pain   Patient presents with shortness of breath and generalized weakness.  States has been feeling bad.  Has been worsening over the last few days.  Had recently been in a nursing home up until Saturday.  Since then increasing swelling and feeling worse.  More swelling his legs.  Reportedly has negative Doppler at the [place].  Also reportedly had increasing of his Lasix without relief.  Feels more short of breath.  No fevers.  Not able to do the same activities as prior.  Recent admission to the hospital with GI bleed.  Appears baseline creatinine is around 2.9.    Past Medical History:  Diagnosis Date   Acute biliary pancreatitis 10/2017   with choledocholithiasis.  ERCP, stone extraction performed   Arthritis    CAD (coronary artery disease)    a. 1997 CABGx3 (VG->RPDA, VG->PLV, LIMA->LAD);  b. 07/2004 PCI VG->PLV (3.5x16 Taxus DES, 3.5x12 Taxus DES); c. 11/2009 PCI: VG->PLV 3.5x12 Promus DES), VG->RPDA (3.5x15 Promus DES); d. 08/2012 Cath: patent grafts; e. 05/2013 Lexiscan MV: no ischemia/infarction, EF 44%.   Cataract    Chronic combined systolic and diastolic CHF (congestive heart failure) ([place]) 08/2012   a. EF 50-55% 11/2011; b. 08/2012 - EF 25-30%,  with mild RM, mod TR, mod RAE & LAE, moderate reduced RV systolic function and PASP of 64mmHg. - > cath showed patent grafts & Mod Puml HTN wth elevated PCWP;  c. 01/2014 Echo: EF 35-40%, mod LVH, mod HK, mildly dil LA, mod dil RA, PASP 88mmHg.   CKD (chronic kidney disease), stage III (HCC)    Dyslipidemia    GERD (gastroesophageal reflux disease)    Gout    history colon cancer 2005   colon surgery done   History of home oxygen therapy    uses oxygen  nightly with cpap    Hyperkalemia    Hypertension    Insulin Dependent Diabetes mellitus    type 2   Myocardial infarction ([place]) 1997   PAD (peripheral artery disease) ([place])    Sleep apnea    Thrombocytopenia ([place]) 2011   dates back to 2011   UTI (lower urinary tract infection) 08/2012   E.coli    Patient Active Problem List   Diagnosis Date Noted   Cellulitis 04/03/2021   Pulmonary fibrosis ([place]) 03/20/2021   Acute on chronic blood loss anemia 03/10/2021   Acute kidney injury superimposed on CKD ([place]) 03/10/2021   Elevated troponin 03/10/2021   Shortness of breath 03/10/2021   Multiple gastric ulcers    Acute upper GI bleed 03/09/2021   Osteomyelitis of fifth toe of right foot ([place]) 02/25/2020   PAD (peripheral artery disease) ([place]) 01/03/2020   Choledocholithiasis    Pancreatitis 10/16/2017   Midsternal chest pain 05/21/2017   Dupuytren's contracture 02/25/2016   Nausea - without vomiting 01/01/2015   Low grade fever 01/01/2015   DOE (dyspnea on exertion) 01/01/2015   Unstable angina ([place]) 05/12/2013   Chronic combined systolic and diastolic heart failure ([place]) 05/12/2013   Personal history of other malignant neoplasm of skin 04/05/2013   CKD (chronic kidney disease) stage 3, GFR 30-59 ml/min ([place]) 09/01/2012   OSA (obstructive sleep apnea) 07/04/2012  Dyspnea 11/05/2011   Diabetes mellitus, type II, insulin dependent ([place]) 04/07/2011   Hypertension    Ischemic cardiomyopathy    Myocardial infarction [facility])    Dyslipidemia    Arteriosclerotic coronary artery disease     Past Surgical History:  Procedure Laterality Date   ABDOMINAL AORTOGRAM W/LOWER EXTREMITY Right 01/04/2020   Abdominal aortogram with right lower extremity runoff   ABDOMINAL AORTOGRAM W/LOWER EXTREMITY Bilateral 01/04/2020   Procedure: ABDOMINAL AORTOGRAM W/LOWER EXTREMITY;  Surgeon: Elam Dutch, MD;  Location: [facility] CV LAB;  Service: Vascular;  Laterality: Bilateral;   AMPUTATION  Right 02/25/2020   Procedure: RIGHT FIFTH TOE AMPUTATION;  Surgeon: Elam Dutch, MD;  Location: [facility];  Service: Vascular;  Laterality: Right;   AMPUTATION Right 10/06/2020   Procedure: RIGHT FOURTH TOE AMPUTATION WITH RESECTION OF METATARSAL HEAD;  Surgeon: Elam Dutch, MD;  Location: [facility] OR;  Service: Vascular;  Laterality: Right;   APPENDECTOMY     BACK SURGERY     15 years ago lower   BIOPSY  03/10/2021   Procedure: BIOPSY;  Surgeon: Irene Shipper, MD;  Location: [facility] ENDOSCOPY;  Service: Endoscopy;;   CARDIAC CATHETERIZATION  2005   stenting of the vein graft to the posterior lateral per -- Dr. Martinique       CARDIAC CATHETERIZATION  2011   showed right coronary, totally occluded LAD and severe stenosis in both saphenous vein graft to the posterior lateral and posterior descending  -- stenting of the proximal portion of the SVG to the posterior lateral branch in 2/11   CHOLECYSTECTOMY     COLONOSCOPY     x several - colon polyps   CORONARY ARTERY BYPASS GRAFT  1997    (LIMA to LAD, SVG to PDA, SVG to PL)   ERCP N/A 10/17/2017    Irene Shipper, MD;  [facility] ENDOSCOPY; choledocholithiasis, biliary pancreatitis.  s/p sphincterotomy and stone extraction.     ESOPHAGOGASTRODUODENOSCOPY (EGD) WITH PROPOFOL N/A 09/21/2017   Procedure: ESOPHAGOGASTRODUODENOSCOPY (EGD) WITH PROPOFOL;  Surgeon: Doran Stabler, MD;  Location: [facility] ENDOSCOPY;  Service: Gastroenterology;  Laterality: N/A;   ESOPHAGOGASTRODUODENOSCOPY (EGD) WITH PROPOFOL N/A 03/10/2021   Procedure: ESOPHAGOGASTRODUODENOSCOPY (EGD) WITH PROPOFOL;  Surgeon: Irene Shipper, MD;  Location: [facility] ENDOSCOPY;  Service: Endoscopy;  Laterality: N/A;   EYE SURGERY     ioc for cataract   HERNIA REPAIR     LEFT AND RIGHT HEART CATHETERIZATION WITH CORONARY/GRAFT ANGIOGRAM  09/04/2012   Procedure: LEFT AND RIGHT HEART CATHETERIZATION WITH Beatrix Fetters;  Surgeon: Sherren Mocha, MD;  Location: [facility] CATH LAB;  patent grafts, mod 2ndary  pulmonary HTN, elevated LV EDP & PCWP   LEFT HEART CATH AND CORS/GRAFTS ANGIOGRAPHY N/A 06/17/2017   Procedure: LEFT HEART CATH AND CORS/GRAFTS ANGIOGRAPHY;  Surgeon: Martinique, Peter M, MD;  Location: [place] CV LAB;  Service: Cardiovascular;  Laterality: N/A;   mohs     x several   PARTIAL COLECTOMY     cancerous polyps   stent to heart  last done 5 to 6 yrs ago   x 4   UPPER GI ENDOSCOPY         Family History  Problem Relation Age of Onset   Coronary artery disease Father    Heart attack Father    Coronary artery disease Brother        1/2 brother    Kidney disease Sister    Cardiomyopathy Mother    Asthma Brother  Social History   Tobacco Use   Smoking status: Former    Packs/day: 1.50    Years: 35.00    Pack years: 52.50    Types: Cigarettes    Quit date: 10/12/1983    Years since quitting: 37.5   Smokeless tobacco: Never  Vaping Use   Vaping Use: Never used  Substance Use Topics   Alcohol use: Yes    Alcohol/week: 3.0 - 4.0 standard drinks    Types: 3 - 4 Standard drinks or equivalent per week   Drug use: No    Home Medications Prior to Admission medications   Medication Sig Start Date End Date Taking? Authorizing Provider  allopurinol (ZYLOPRIM) 100 MG tablet Take 1 tablet (100 mg total) by mouth daily. 01/17/20   Martinique, Peter M, MD  aspirin 81 MG EC tablet Take 81 mg by mouth at bedtime.    [provider]  atorvastatin (LIPITOR) 20 MG tablet Take 1 tablet (20 mg total) by mouth daily. 03/02/21 02/25/22  Martinique, Peter M, MD  bisacodyl (DULCOLAX) 10 MG suppository Place 10 mg rectally as needed for moderate constipation.    [provider]  bismuth subsalicylate (PEPTO BISMOL) 262 MG/15ML suspension Take 30 mLs by mouth every 6 (six) hours as needed for indigestion.    [provider]  carvedilol (COREG) 6.25 MG tablet Take 1 tablet (6.25 mg total) by mouth 2 (two) times daily with a meal. 03/16/21 03/11/22  Charlynne Cousins, MD   Cephalexin 500 MG tablet Take 500 mg by mouth 3 (three) times daily. For cellulitis    [provider]  Cholecalciferol (VITAMIN D) 50 MCG (2000 UT) tablet Take 2,000 Units by mouth every Monday, Wednesday, and Friday.    [provider]  colchicine 0.6 MG tablet Take 0.6-1.2 mg by mouth See admin instructions. Take 2 tablets (1.2 mg) by mouth at onset of gout attack, may take 1 tablet (0.6 mg) one hour later if still needed. Max 3 tablets in 3 days    [provider]  febuxostat (ULORIC) 40 MG tablet Take 40 mg by mouth every morning. 02/14/20   [provider]  ferrous sulfate 325 (65 FE) MG tablet Take 325 mg by mouth every Monday, Wednesday, and Friday.    [provider]  furosemide (LASIX) 40 MG tablet Take 40 mg by mouth See admin instructions. Take one tablet (40 mg) by mouth daily, may increase to 2 tablets (80 mg) daily as needed for weight gain of 2-3 lbs or leg swelling    [provider]  Insulin Aspart (NOVOLOG FLEXPEN [place]) Inject 5 Units into the skin. GIVE 5 UNITS SQ BID FOR CBG > 200. CALL PROVIDER FOR CBG > 400    [provider]  latanoprost (XALATAN) 0.005 % ophthalmic solution Place 1 drop into both eyes at bedtime.    [provider]  magnesium hydroxide (MILK OF MAGNESIA) 400 MG/5ML suspension Take 30 mLs by mouth daily as needed for mild constipation.    [provider]  Multiple Vitamin (MULTIVITAMIN WITH MINERALS) TABS tablet Take 1 tablet by mouth daily.    [provider]  nitroGLYCERIN (NITROSTAT) 0.4 MG SL tablet Place 1 tablet (0.4 mg total) under the tongue every 5 (five) minutes as needed for chest pain (up to 3 doses). 02/20/15   Martinique, Peter M, MD  oxyCODONE-acetaminophen (PERCOCET/ROXICET) 5-325 MG tablet Take 1 tablet by mouth every 6 (six) hours as needed for severe pain. 03/31/21  Medina-Vargas, Monina C, NP  pantoprazole (PROTONIX) 40 MG tablet Take 1 tablet (40 mg total) by  mouth 2 (two) times daily. 06/09/17   Almyra Deforest, PA  Polyethylene Glycol 3350 (MIRALAX PO) Take by mouth. ADD 4-6 OZ LIQUID BY MOUTH ONCE A DAY FOR CONSTIPATION    [provider]  PRESCRIPTION MEDICATION Inhale into the lungs at bedtime. CPAP    [provider]  saccharomyces boulardii (FLORASTOR) 250 MG capsule Take 250 mg by mouth 2 (two) times daily. For 13 days    [provider]  Sodium Phosphates (RA SALINE ENEMA RE) Place rectally as needed.    [provider]  tamsulosin (FLOMAX) 0.4 MG CAPS capsule Take 0.4 mg by mouth at bedtime.    [provider]    Allergies    Patient has no known allergies.  Review of Systems   Review of Systems  Constitutional:  Positive for fatigue. Negative for appetite change.  HENT:  Negative for congestion.   Respiratory:  Positive for shortness of breath.   Cardiovascular:  Positive for leg swelling. Negative for chest pain.  Gastrointestinal:  Negative for blood in stool.  Genitourinary:  Negative for flank pain.  Musculoskeletal:  Negative for back pain.  Skin:  Positive for wound.  Neurological:  Negative for weakness.  Psychiatric/Behavioral:  Negative for confusion.    Physical Exam Updated Vital Signs BP 139/72   Pulse (!) 52   Temp (!) 97.5 F (36.4 C) (Oral)   Resp 17   SpO2 100%   Physical Exam Vitals and nursing note reviewed.  HENT:     Head: Atraumatic.  Cardiovascular:     Rate and Rhythm: Regular rhythm. Bradycardia present.  Pulmonary:     Breath sounds: Normal breath sounds.     Comments: Mildly harsh breath sounds without focal rales or rhonchi. Chest:     Chest wall: No tenderness.  Abdominal:     Tenderness: There is no abdominal tenderness.  Musculoskeletal:     Cervical back: Neck supple.     Right lower leg: Edema present.     Left lower leg: Edema present.     Comments: Pitting edema bilateral lower extremities with chronic venous changes also.  Weeping wounds.   Currently on doxycycline.  Edema goes up to abdomen.  Somewhat protuberant abdomen also.  Skin:    Capillary Refill: Capillary refill takes less than 2 seconds.  Neurological:     Mental Status: He is alert and oriented to person, place, and time.    ED Results / Procedures / Treatments   Labs (all labs ordered are listed, but only abnormal results are displayed) Labs Reviewed  COMPREHENSIVE METABOLIC PANEL - Abnormal; Notable for the following components:      Result Value   Sodium 134 (*)    Glucose, Bld 115 (*)    BUN 73 (*)    Creatinine, Ser 3.23 (*)    Calcium 8.6 (*)    Albumin 3.0 (*)    GFR, Estimated 18 (*)    All other components within normal limits  CBC WITH DIFFERENTIAL/PLATELET - Abnormal; Notable for the following components:   RBC 3.20 (*)    Hemoglobin 9.3 (*)    HCT 32.0 (*)    MCHC 29.1 (*)    RDW 17.5 (*)    Platelets 73 (*)    All other components within normal limits  BRAIN NATRIURETIC PEPTIDE - Abnormal; Notable for the following components:   B Natriuretic Peptide  2,193.6 (*)    All other components within normal limits  CBG MONITORING, ED - Abnormal; Notable for the following components:   Glucose-Capillary 134 (*)    All other components within normal limits  TROPONIN I (HIGH SENSITIVITY) - Abnormal; Notable for the following components:   Troponin I (High Sensitivity) 35 (*)    All other components within normal limits  TROPONIN I (HIGH SENSITIVITY)    EKG None  Radiology DG Chest 2 View  Result Date: 04/09/2021 CLINICAL DATA:  Shortness of breath. EXAM: CHEST - 2 VIEW COMPARISON:  10/15/2017. FINDINGS: Prior CABG. Cardiomegaly and pulmonary venous congestion. Mild bibasilar pulmonary infiltrates and or edema. Underlying chronic interstitial changes. Small bilateral pleural effusions. No pneumothorax. IMPRESSION: 1. Prior CABG. Cardiomegaly with pulmonary venous congestion. Mild bibasilar pulmonary infiltrates/edema. Small bilateral pleural  effusions. 2.  Underlying chronic interstitial disease. Electronically Signed   By: Marcello Moores  Register   On: 04/09/2021 13:09    Procedures Procedures   Medications Ordered in ED Medications - No data to display  ED Course  I have reviewed the triage vital signs and the nursing notes.  Pertinent labs & imaging results that were available during my care of the patient were reviewed by me and considered in my medical decision making (see chart for details).    MDM Rules/Calculators/A&P                          Patient presents with worsening edema.  History of CHF.  Has been on increasing doses of Lasix through the [place].  Continued worsening swelling.  Fatigue.  BNP is elevated.  Troponin mildly elevated but less than it was with recent admission.  Chest x-ray shows interstitial disease and pulmonary edema.  I think with increasing edema despite being on increasing dose of Lasix patient benefit from [facility] to the [facility].  Also has creatinine that is gone up to 3.2 from 2.9.  Will discuss with hospitalist. Final Clinical Impression(s) / ED Diagnoses Final diagnoses:  Acute on chronic congestive heart failure, unspecified heart failure type [facility])    Rx / DC Orders ED Discharge Orders     None        Davonna Belling, MD 04/09/21 1526

## 2021-04-09 NOTE — ED Notes (Signed)
Attempted to call report. Secretary states that the RN is in an isolation room. Will call back later.

## 2021-04-09 NOTE — ED Notes (Signed)
Pt reports increased fluid accumulation from waist down

## 2021-04-09 NOTE — ED Notes (Signed)
Report called to Kathlee Nations, RN receiving pt in room 3E02.

## 2021-04-09 NOTE — Telephone Encounter (Signed)
Received a call from patient he stated he did not go to ED yesterday as advised by [facility].Stated his sob is worse this morning.He is having swelling all over body worse in lower legs and abdomen.He wanted Dr.Jordan's advice.Advised Dr.Jordan is out of office today. Advised to go to [facility] ED.I will make Dr.Jordan aware.

## 2021-04-09 NOTE — H&P (Signed)
History and Physical    KAL CHAIT WSF:[id number] DOB: 06-13-1933 DOA: 04/09/2021  PCP: Reubin Milan, MD (Confirm with patient/family/NH records and if not entered, this has to be entered at [facility] point of entry) Patient coming from: Home  I have personally briefly reviewed patient's old medical records in [place]  Chief Complaint: SOB and leg swelling  HPI: Scott [name] is a 87 y.o. male with medical history significant of chronic systolic CHF, CAD status post CABG stenting, HTN, CKD stage III, HLD, GERD, GI bleed, chronic iron deficiency anemia, gout, presented with increasing shortness of breath and with cleaning and leg swelling.  Patient started to feel increasing shortness of breath exertional for the last 2 to 3 weeks, and he also noticed increasing swelling of his legs, and gradually weight gaining.  He estimated weight gaining of 7 to 8 pounds over last 4 weeks.  He has been increasing his Lasix 40 mg daily to 40 mg twice daily for last 7 days with little help.  Denies any chest pain, no cough no fever chills.  ED Course: Patient was found to have respiratory distress, chest x-ray showed lung congestion, 40 mg Lasix given.  Blood work showed a little bit increase of kidney function creatinine 3.2 compared to baseline 2.9, chronic thrombocytopenia.  Review of Systems: As per HPI otherwise 14 point review of systems negative.    Past Medical History:  Diagnosis Date   Acute biliary pancreatitis 10/2017   with choledocholithiasis.  ERCP, stone extraction performed   Arthritis    CAD (coronary artery disease)    a. 1997 CABGx3 (VG->RPDA, VG->PLV, LIMA->LAD);  b. 07/2004 PCI VG->PLV (3.5x16 Taxus DES, 3.5x12 Taxus DES); c. 11/2009 PCI: VG->PLV 3.5x12 Promus DES), VG->RPDA (3.5x15 Promus DES); d. 08/2012 Cath: patent grafts; e. 05/2013 Lexiscan MV: no ischemia/infarction, EF 44%.   Cataract    Chronic combined systolic and diastolic CHF (congestive heart failure) ([place])  08/2012   a. EF 50-55% 11/2011; b. 08/2012 - EF 25-30%,  with mild RM, mod TR, mod RAE & LAE, moderate reduced RV systolic function and PASP of 36mmHg. - > cath showed patent grafts & Mod Puml HTN wth elevated PCWP;  c. 01/2014 Echo: EF 35-40%, mod LVH, mod HK, mildly dil LA, mod dil RA, PASP 86mmHg.   CKD (chronic kidney disease), stage III (HCC)    Dyslipidemia    GERD (gastroesophageal reflux disease)    Gout    history colon cancer 2005   colon surgery done   History of home oxygen therapy    uses oxygen nightly with cpap    Hyperkalemia    Hypertension    Insulin Dependent Diabetes mellitus    type 2   Myocardial infarction [facility]) 1997   PAD (peripheral artery disease) ([place])    Sleep apnea    Thrombocytopenia ([place]) 2011   dates back to 2011   UTI (lower urinary tract infection) 08/2012   E.coli    Past Surgical History:  Procedure Laterality Date   ABDOMINAL AORTOGRAM W/LOWER EXTREMITY Right 01/04/2020   Abdominal aortogram with right lower extremity runoff   ABDOMINAL AORTOGRAM W/LOWER EXTREMITY Bilateral 01/04/2020   Procedure: ABDOMINAL AORTOGRAM W/LOWER EXTREMITY;  Surgeon: Elam Dutch, MD;  Location: [place] CV LAB;  Service: Vascular;  Laterality: Bilateral;   AMPUTATION Right 02/25/2020   Procedure: RIGHT FIFTH TOE AMPUTATION;  Surgeon: Elam Dutch, MD;  Location: [place];  Service: Vascular;  Laterality: Right;   AMPUTATION Right 10/06/2020  Procedure: RIGHT FOURTH TOE AMPUTATION WITH RESECTION OF METATARSAL HEAD;  Surgeon: Elam Dutch, MD;  Location: [facility] OR;  Service: Vascular;  Laterality: Right;   APPENDECTOMY     BACK SURGERY     15 years ago lower   BIOPSY  03/10/2021   Procedure: BIOPSY;  Surgeon: Irene Shipper, MD;  Location: [facility] ENDOSCOPY;  Service: Endoscopy;;   CARDIAC CATHETERIZATION  2005   stenting of the vein graft to the posterior lateral per -- Dr. Martinique       CARDIAC CATHETERIZATION  2011   showed right coronary, totally occluded  LAD and severe stenosis in both saphenous vein graft to the posterior lateral and posterior descending  -- stenting of the proximal portion of the SVG to the posterior lateral branch in 2/11   CHOLECYSTECTOMY     COLONOSCOPY     x several - colon polyps   CORONARY ARTERY BYPASS GRAFT  1997    (LIMA to LAD, SVG to PDA, SVG to PL)   ERCP N/A 10/17/2017    Irene Shipper, MD;  [facility] ENDOSCOPY; choledocholithiasis, biliary pancreatitis.  s/p sphincterotomy and stone extraction.     ESOPHAGOGASTRODUODENOSCOPY (EGD) WITH PROPOFOL N/A 09/21/2017   Procedure: ESOPHAGOGASTRODUODENOSCOPY (EGD) WITH PROPOFOL;  Surgeon: Doran Stabler, MD;  Location: [facility] ENDOSCOPY;  Service: Gastroenterology;  Laterality: N/A;   ESOPHAGOGASTRODUODENOSCOPY (EGD) WITH PROPOFOL N/A 03/10/2021   Procedure: ESOPHAGOGASTRODUODENOSCOPY (EGD) WITH PROPOFOL;  Surgeon: Irene Shipper, MD;  Location: [facility] ENDOSCOPY;  Service: Endoscopy;  Laterality: N/A;   EYE SURGERY     ioc for cataract   HERNIA REPAIR     LEFT AND RIGHT HEART CATHETERIZATION WITH CORONARY/GRAFT ANGIOGRAM  09/04/2012   Procedure: LEFT AND RIGHT HEART CATHETERIZATION WITH Beatrix Fetters;  Surgeon: Sherren Mocha, MD;  Location: [facility] CATH LAB;  patent grafts, mod 2ndary pulmonary HTN, elevated LV EDP & PCWP   LEFT HEART CATH AND CORS/GRAFTS ANGIOGRAPHY N/A 06/17/2017   Procedure: LEFT HEART CATH AND CORS/GRAFTS ANGIOGRAPHY;  Surgeon: Martinique, Peter M, MD;  Location: [place] CV LAB;  Service: Cardiovascular;  Laterality: N/A;   mohs     x several   PARTIAL COLECTOMY     cancerous polyps   stent to heart  last done 5 to 6 yrs ago   x 4   UPPER GI ENDOSCOPY       reports that he quit smoking about 37 years ago. His smoking use included cigarettes. He has a 52.50 pack-year smoking history. He has never used smokeless tobacco. He reports current alcohol use of about 3.0 - 4.0 standard drinks of alcohol per week. He reports that he does not use drugs.  No Known  Allergies  Family History  Problem Relation Age of Onset   Coronary artery disease Father    Heart attack Father    Coronary artery disease Brother        1/2 brother    Kidney disease Sister    Cardiomyopathy Mother    Asthma Brother      Prior to Admission medications   Medication Sig Start Date End Date Taking? Authorizing Provider  allopurinol (ZYLOPRIM) 100 MG tablet Take 1 tablet (100 mg total) by mouth daily. 01/17/20   Martinique, Peter M, MD  aspirin 81 MG EC tablet Take 81 mg by mouth at bedtime.    [provider]  atorvastatin (LIPITOR) 20 MG tablet Take 1 tablet (20 mg total) by mouth daily. 03/02/21 02/25/22  Martinique, Peter M,  MD  bisacodyl (DULCOLAX) 10 MG suppository Place 10 mg rectally as needed for moderate constipation.    [provider]  bismuth subsalicylate (PEPTO BISMOL) 262 MG/15ML suspension Take 30 mLs by mouth every 6 (six) hours as needed for indigestion.    [provider]  carvedilol (COREG) 6.25 MG tablet Take 1 tablet (6.25 mg total) by mouth 2 (two) times daily with a meal. 03/16/21 03/11/22  Charlynne Cousins, MD  Cephalexin 500 MG tablet Take 500 mg by mouth 3 (three) times daily. For cellulitis    [provider]  Cholecalciferol (VITAMIN D) 50 MCG (2000 UT) tablet Take 2,000 Units by mouth every Monday, Wednesday, and Friday.    [provider]  colchicine 0.6 MG tablet Take 0.6-1.2 mg by mouth See admin instructions. Take 2 tablets (1.2 mg) by mouth at onset of gout attack, may take 1 tablet (0.6 mg) one hour later if still needed. Max 3 tablets in 3 days    [provider]  febuxostat (ULORIC) 40 MG tablet Take 40 mg by mouth every morning. 02/14/20   [provider]  ferrous sulfate 325 (65 FE) MG tablet Take 325 mg by mouth every Monday, Wednesday, and Friday.    [provider]  furosemide (LASIX) 40 MG tablet Take 40 mg by mouth See admin instructions. Take one tablet (40 mg) by mouth  daily, may increase to 2 tablets (80 mg) daily as needed for weight gain of 2-3 lbs or leg swelling    [provider]  Insulin Aspart (NOVOLOG FLEXPEN [place]) Inject 5 Units into the skin. GIVE 5 UNITS SQ BID FOR CBG > 200. CALL PROVIDER FOR CBG > 400    [provider]  latanoprost (XALATAN) 0.005 % ophthalmic solution Place 1 drop into both eyes at bedtime.    [provider]  magnesium hydroxide (MILK OF MAGNESIA) 400 MG/5ML suspension Take 30 mLs by mouth daily as needed for mild constipation.    [provider]  Multiple Vitamin (MULTIVITAMIN WITH MINERALS) TABS tablet Take 1 tablet by mouth daily.    [provider]  nitroGLYCERIN (NITROSTAT) 0.4 MG SL tablet Place 1 tablet (0.4 mg total) under the tongue every 5 (five) minutes as needed for chest pain (up to 3 doses). 02/20/15   Martinique, Peter M, MD  oxyCODONE-acetaminophen (PERCOCET/ROXICET) 5-325 MG tablet Take 1 tablet by mouth every 6 (six) hours as needed for severe pain. 03/31/21   Medina-Vargas, Monina C, NP  pantoprazole (PROTONIX) 40 MG tablet Take 1 tablet (40 mg total) by mouth 2 (two) times daily. 06/09/17   Almyra Deforest, PA  Polyethylene Glycol 3350 (MIRALAX PO) Take by mouth. ADD 4-6 OZ LIQUID BY MOUTH ONCE A DAY FOR CONSTIPATION    [provider]  PRESCRIPTION MEDICATION Inhale into the lungs at bedtime. CPAP    [provider]  saccharomyces boulardii (FLORASTOR) 250 MG capsule Take 250 mg by mouth 2 (two) times daily. For 13 days    [provider]  Sodium Phosphates (RA SALINE ENEMA RE) Place rectally as needed.    [provider]  tamsulosin (FLOMAX) 0.4 MG CAPS capsule Take 0.4 mg by mouth at bedtime.    [provider]    Physical Exam: Vitals:   04/09/21 1505 04/09/21 1530 04/09/21 1545 04/09/21 1600  BP: 139/72 127/66 117/61 122/60  Pulse: (!) 52 (!) 51 (!) 50 (!) 50  Resp: 17 15 14 14   Temp:  TempSrc:      SpO2: 100% 100% 100%  100%    Constitutional: NAD, calm, comfortable Vitals:   04/09/21 1505 04/09/21 1530 04/09/21 1545 04/09/21 1600  BP: 139/72 127/66 117/61 122/60  Pulse: (!) 52 (!) 51 (!) 50 (!) 50  Resp: 17 15 14 14   Temp:      TempSrc:      SpO2: 100% 100% 100% 100%   Eyes: PERRL, lids and conjunctivae normal ENMT: Mucous membranes are moist. Posterior pharynx clear of any exudate or lesions.Normal dentition.  Neck: normal, supple, no masses, no thyromegaly Respiratory: clear to auscultation bilaterally, no wheezing, fine crackles on bilateral bases. Normal respiratory effort. No accessory muscle use.  Cardiovascular: Regular rate and rhythm, no murmurs / rubs / gallops. 2+ extremity edema. 2+ pedal pulses. No carotid bruits.  Abdomen: no tenderness, no masses palpated. No hepatosplenomegaly. Bowel sounds positive.  Musculoskeletal: no clubbing / cyanosis. No joint deformity upper and lower extremities. Good ROM, no contractures. Normal muscle tone.  Skin: no rashes, lesions, ulcers. No induration Neurologic: CN 2-12 grossly intact. Sensation intact, DTR normal. Strength 5/5 in all 4.  Psychiatric: Normal judgment and insight. Alert and oriented x 3. Normal mood.     Labs on Admission: I have personally reviewed following labs and imaging studies  CBC: Recent Labs  Lab 04/09/21 1208  WBC 6.8  NEUTROABS 4.7  HGB 9.3*  HCT 32.0*  MCV 100.0  PLT 73*   Basic Metabolic Panel: Recent Labs  Lab 04/09/21 1208  NA 134*  K 5.0  CL 101  CO2 24  GLUCOSE 115*  BUN 73*  CREATININE 3.23*  CALCIUM 8.6*   GFR: Estimated Creatinine Clearance: 20.2 mL/min (A) (by C-G formula based on SCr of 3.23 mg/dL (H)). Liver Function Tests: Recent Labs  Lab 04/09/21 1208  AST 30  ALT 11  ALKPHOS 89  BILITOT 1.2  PROT 6.8  ALBUMIN 3.0*   No results for input(s): LIPASE, AMYLASE in the last 168 hours. No results for input(s): AMMONIA in the last 168 hours. Coagulation Profile: No results for  input(s): INR, PROTIME in the last 168 hours. Cardiac Enzymes: No results for input(s): CKTOTAL, CKMB, CKMBINDEX, TROPONINI in the last 168 hours. BNP (last 3 results) No results for input(s): PROBNP in the last 8760 hours. HbA1C: No results for input(s): HGBA1C in the last 72 hours. CBG: Recent Labs  Lab 04/09/21 1414  GLUCAP 134*   Lipid Profile: No results for input(s): CHOL, HDL, LDLCALC, TRIG, CHOLHDL, LDLDIRECT in the last 72 hours. Thyroid Function Tests: No results for input(s): TSH, T4TOTAL, FREET4, T3FREE, THYROIDAB in the last 72 hours. Anemia Panel: No results for input(s): VITAMINB12, FOLATE, FERRITIN, TIBC, IRON, RETICCTPCT in the last 72 hours. Urine analysis:    Component Value Date/Time   COLORURINE YELLOW 03/10/2021 1029   APPEARANCEUR CLEAR 03/10/2021 1029   LABSPEC 1.010 03/10/2021 1029   PHURINE 5.0 03/10/2021 1029   GLUCOSEU NEGATIVE 03/10/2021 1029   [place] 03/10/2021 [place] 03/10/2021 [place] 03/10/2021 [place] 03/10/2021 1029   UROBILINOGEN 1.0 08/31/2012 1735   NITRITE NEGATIVE 03/10/2021 [place] 03/10/2021 1029    Radiological Exams on Admission: DG Chest 2 View  Result Date: 04/09/2021 CLINICAL DATA:  Shortness of breath. EXAM: CHEST - 2 VIEW COMPARISON:  10/15/2017. FINDINGS: Prior CABG. Cardiomegaly and pulmonary venous congestion. Mild bibasilar pulmonary infiltrates and or edema. Underlying chronic interstitial changes. Small bilateral  pleural effusions. No pneumothorax. IMPRESSION: 1. Prior CABG. Cardiomegaly with pulmonary venous congestion. Mild bibasilar pulmonary infiltrates/edema. Small bilateral pleural effusions. 2.  Underlying chronic interstitial disease. Electronically Signed   By: Marcello Moores  Register   On: 04/09/2021 13:09    EKG: Independently reviewed.  Sinus, chronic ST-T changes multiple leads.  Assessment/Plan Active Problems:   Acute CHF  (congestive heart failure) (HCC)   CHF (congestive heart failure) ([place])  (please populate well all problems here in Problem List. (For example, if patient is on BP meds at home and you resume or decide to hold them, it is a problem that needs to be her. Same for CAD, COPD, HLD and so on)  Acute on chronic systolic CHF decompensation -Increase Lasix to 40 mg IV twice daily -Check echocardiogram  CKD stage III -Chronic level stable, symptoms signs of fluid overload -Probably not a good candidate for HD given multiple comorbidities. -Daily BMP while on Lasix.  Chronic thrombocytopenia -Will hold off chemical DVT prophylaxis  Chronic iron deficiency anemia -H&H stable, continue iron supplement.  IDDM -Sliding scale for now  Gout -Check uric acid level, continue colchicine and Uloric  DVT prophylaxis: Foot pumps Code Status: DNR Family Communication: None at bedside Disposition Plan: Expect more than 2 midnight hospital stay for aggressive diuresis Consults called: None Admission status: Tele admit   Lequita Halt MD [facility] Pager [phone]  04/09/2021, 5:31 PM

## 2021-04-09 NOTE — ED Triage Notes (Signed)
Patient complains of shortness of breath for the last few weeks, reports having fluid around his heart and lungs. Patient alert, oriented, and in no apparent distress at this time.

## 2021-04-09 NOTE — ED Provider Notes (Addendum)
Emergency Medicine Provider Triage Evaluation Note  Scott [name] , a 87 y.o. male  was evaluated in triage.  Pt complains of shortness of breath.  Has been present over the last 3 days.  Progressively worsening at this time.  Shortness of breath is worse with exertion.  Patient reports that he has had weight fluctuation over the last week.  Also reports that he has had swelling to bilateral lower extremities over the last week.  Patient reports that he has had ultrasound imaging to evaluate for DVT in the outpatient setting which was negative.  No known sick contacts.  Patient reports he has been vaccinated for COVID-19 and is collected.  Review of Systems  Positive: Shortness of breath, leg swelling Negative: Chest pain, palpitations, cough, rhinorrhea, nasal congestion, sore throat,  Physical Exam  BP 122/62 (BP Location: Right Arm)   Pulse (!) 59   Temp (!) 97.5 F (36.4 C) (Oral)   Resp 16   SpO2 98%  Gen:   Awake, no distress   Resp:  Normal effort, lungs clear to auscultation bilaterally MSK:   Moves extremities without difficulty, +2 pitting edema to bilateral lower extremities, lower legs are wrapped due to weeping Other:    Medical Decision Making  Medically screening exam initiated at 12:12 PM.  Appropriate orders placed.  Scott [name] was informed that the remainder of the evaluation will be completed by another provider, this initial triage assessment does not replace that evaluation, and the importance of remaining in the ED until their evaluation is complete.  The patient appears stable so that the remainder of the work up may be completed by another provider.      Scott Beckwith, PA-C 04/09/21 1230    Scott Beckwith, PA-C 04/09/21 1336    Scott Rank, MD 04/10/21 [phone]

## 2021-04-10 ENCOUNTER — Inpatient Hospital Stay (HOSPITAL_COMMUNITY): Payer: Medicare HMO

## 2021-04-10 DIAGNOSIS — L03115 Cellulitis of right lower limb: Secondary | ICD-10-CM

## 2021-04-10 DIAGNOSIS — I255 Ischemic cardiomyopathy: Secondary | ICD-10-CM

## 2021-04-10 DIAGNOSIS — I739 Peripheral vascular disease, unspecified: Secondary | ICD-10-CM

## 2021-04-10 DIAGNOSIS — E119 Type 2 diabetes mellitus without complications: Secondary | ICD-10-CM

## 2021-04-10 DIAGNOSIS — I10 Essential (primary) hypertension: Secondary | ICD-10-CM

## 2021-04-10 DIAGNOSIS — N184 Chronic kidney disease, stage 4 (severe): Secondary | ICD-10-CM

## 2021-04-10 DIAGNOSIS — E785 Hyperlipidemia, unspecified: Secondary | ICD-10-CM

## 2021-04-10 DIAGNOSIS — I5021 Acute systolic (congestive) heart failure: Secondary | ICD-10-CM

## 2021-04-10 DIAGNOSIS — Z794 Long term (current) use of insulin: Secondary | ICD-10-CM

## 2021-04-10 LAB — GLUCOSE, CAPILLARY
Glucose-Capillary: 145 mg/dL — ABNORMAL HIGH (ref 70–99)
Glucose-Capillary: 213 mg/dL — ABNORMAL HIGH (ref 70–99)
Glucose-Capillary: 171 mg/dL — ABNORMAL HIGH (ref 70–99)
Glucose-Capillary: 169 mg/dL — ABNORMAL HIGH (ref 70–99)

## 2021-04-10 LAB — BASIC METABOLIC PANEL
Anion gap: 6 (ref 5–15)
BUN: 73 mg/dL — ABNORMAL HIGH (ref 8–23)
CO2: 25 mmol/L (ref 22–32)
Calcium: 8.4 mg/dL — ABNORMAL LOW (ref 8.9–10.3)
Chloride: 103 mmol/L (ref 98–111)
Creatinine, Ser: 3.03 mg/dL — ABNORMAL HIGH (ref 0.61–1.24)
GFR, Estimated: 19 mL/min — ABNORMAL LOW (ref >60)
Glucose, Bld: 159 mg/dL — ABNORMAL HIGH (ref 70–99)
Potassium: 4.5 mmol/L (ref 3.5–5.1)
Sodium: 134 mmol/L — ABNORMAL LOW (ref 135–145)

## 2021-04-10 LAB — ECHOCARDIOGRAM COMPLETE
Area-P 1/2: 4.1 cm2
Calc EF: 37.1 %
Height: 72 in
MV M vel: 4.31 m/s
MV Peak grad: 74.3 mmHg
P 1/2 time: 524 msec
Radius: 0.4 cm
S' Lateral: 4.8 cm
Single Plane A2C EF: 32.4 %
Single Plane A4C EF: 39.1 %
Weight: 3422.4 oz

## 2021-04-10 MED ORDER — CEPHALEXIN 250 MG PO CAPS
250.0000 mg | ORAL_CAPSULE | Freq: Three times a day (TID) | ORAL | Status: DC
  Administered 2021-04-10 – 2021-04-14 (×13): 250 mg via ORAL
  Filled 2021-04-10 (×13): qty 1

## 2021-04-10 MED ORDER — FUROSEMIDE 10 MG/ML IJ SOLN
80.0000 mg | Freq: Two times a day (BID) | INTRAMUSCULAR | Status: DC
  Administered 2021-04-10 – 2021-04-11 (×2): 80 mg via INTRAVENOUS
  Filled 2021-04-10 (×3): qty 8

## 2021-04-10 NOTE — Progress Notes (Signed)
  Echocardiogram 2D Echocardiogram has been performed.  Scott [name] 04/10/2021, 1:21 PM

## 2021-04-10 NOTE — TOC Initial Note (Signed)
Transition of Care (TOC) - Initial/Assessment Note  NCM spoke with patient , he lives at home alone, he has a walker, cane, and home oxygen  3 liters with [employer]. , Brother takes him to [facility], he has a scale at home, and he states he consumes a low sodium diet.  He also states he has been to [facility] before in the past. Awaiting pt eval.   Patient Details  Name: Scott [name] MRN: [id number] Date of Birth: 1933-09-10  Transition of Care [facility]) CM/SW Contact:    Zenon Mayo, RN Phone Number: 04/10/2021, 12:41 PM  Clinical Narrative:                   Expected Discharge Plan: [place] Barriers to Discharge: Continued Medical Work up   Patient Goals and CMS Choice Patient states their goals for this hospitalization and ongoing recovery are:: return home      Expected Discharge Plan and Services Expected Discharge Plan: [place]   Discharge Planning Services: CM Consult   Living arrangements for the past 2 months: Single Family Home                   DME Agency: NA       HH Arranged: NA          Prior Living Arrangements/Services Living arrangements for the past 2 months: Single Family Home Lives with:: Self Patient language and need for interpreter reviewed:: Yes Do you feel safe going back to the place where you live?: Yes      Need for Family Participation in Patient Care: Yes (Comment) Care giver support system in place?: Yes (comment) Current home services: DME (has rolling walker, cane, home oxygen (3) liters -[employer]) Criminal Activity/Legal Involvement Pertinent to Current Situation/Hospitalization: No - Comment as needed  Activities of Daily Living Home Assistive Devices/Equipment: CPAP, Walker (specify type), Cane (specify quad or straight) ADL Screening (condition at time of admission) Patient's cognitive ability adequate to safely complete daily activities?: Yes Is the patient deaf or have difficulty  hearing?: No Does the patient have difficulty seeing, even when wearing glasses/contacts?: No Does the patient have difficulty concentrating, remembering, or making decisions?: No Patient able to express need for assistance with ADLs?: Yes Does the patient have difficulty dressing or bathing?: No Independently performs ADLs?: Yes (appropriate for developmental age) Does the patient have difficulty walking or climbing stairs?: Yes Weakness of Legs: Both Weakness of Arms/Hands: None  Permission Sought/Granted                  Emotional Assessment   Attitude/Demeanor/Rapport: Engaged Affect (typically observed): Appropriate Orientation: : Oriented to Situation, Oriented to  Time, Oriented to Place, Oriented to Self Alcohol / Substance Use: Not Applicable Psych Involvement: No (comment)  Admission diagnosis:  CHF (congestive heart failure) ([place]) [I50.9] Acute on chronic congestive heart failure, unspecified heart failure type ([place]) [I50.9] Patient Active Problem List   Diagnosis Date Noted   Acute CHF (congestive heart failure) ([place]) 04/09/2021   CHF (congestive heart failure) ([place]) 04/09/2021   Cellulitis 04/03/2021   Pulmonary fibrosis ([place]) 03/20/2021   Acute on chronic blood loss anemia 03/10/2021   Acute kidney injury superimposed on CKD ([place]) 03/10/2021   Elevated troponin 03/10/2021   Shortness of breath 03/10/2021   Multiple gastric ulcers    Acute upper GI bleed 03/09/2021   Osteomyelitis of fifth toe of right foot ([place]) 02/25/2020   PAD (  peripheral artery disease) ([place]) 01/03/2020   Choledocholithiasis    Pancreatitis 10/16/2017   Midsternal chest pain 05/21/2017   Dupuytren's contracture 02/25/2016   Nausea - without vomiting 01/01/2015   Low grade fever 01/01/2015   DOE (dyspnea on exertion) 01/01/2015   Unstable angina (HCC) 05/12/2013   Chronic combined systolic and diastolic heart failure ([place]) 05/12/2013   Personal history of other malignant neoplasm of  skin 04/05/2013   CKD (chronic kidney disease) stage 3, GFR 30-59 ml/min (HCC) 09/01/2012   OSA (obstructive sleep apnea) 07/04/2012   Dyspnea 11/05/2011   Diabetes mellitus, type II, insulin dependent ([place]) 04/07/2011   Hypertension    Ischemic cardiomyopathy    Myocardial infarction [facility])    Dyslipidemia    Arteriosclerotic coronary artery disease    PCP:  Reubin Milan, MD Pharmacy:   [facility] [place], [place] AT [facility] [address] [place] [address] Phone: [phone] Fax: [place], [place] - [place] [address] [place] [address] Phone: [phone] Fax: [phone]     Social Determinants of Health (SDOH) Interventions    Readmission Risk Interventions Readmission Risk Prevention Plan 04/10/2021 01/09/2020  Transportation Screening Complete Complete  PCP or Specialist Appt within 5-7 Days - Complete  Home Care Screening - Complete  Medication Review (RN CM) - Complete  Social Work Consult for Recovery Care Planning/Counseling Complete -  Palliative Care Screening Not Applicable -  Some recent data might be hidden

## 2021-04-10 NOTE — Progress Notes (Signed)
Pt refusing CPAP tonight. Pt states he will be okay with just O2. Advised pt to notify for RT if he changes his mind and wants to use CPAP. RT will continue to monitor.

## 2021-04-10 NOTE — Progress Notes (Addendum)
PROGRESS NOTE    VERNIS EID  ZDG:[id number] DOB: 03-Dec-1932 DOA: 04/09/2021 PCP: Reubin Milan, MD    Brief Narrative:  Mr. Carew was admitted to the hospital with the working diagnosis of acute decompensation of chronic systolic heart failure, complicated with right leg cellulits.   87 year old male past medical history for systolic heart failure, coronary artery disease, hypertension, chronic kidney disease, dyslipidemia, GERD, chronic iron deficiency anemia and history of GI bleed who presented with dyspnea and lower extremity edema.  He reported progressive symptoms for the last 2 to 3 weeks, gained about 7 to 8 pounds in the last 4 weeks.  His symptoms were refractive to an increased dose of furosemide at home.  On his initial physical examination blood pressure 139/72, heart rate 52, respiratory rate 15, oxygen saturation 100% on supplemental oxygen, his lungs had rales bilaterally at bases, heart S1-S2, present, rhythmic, abdomen soft and nontender, positive pitting bilateral extremity edema 2+.   Sodium 134, potassium 5.0, chloride 101, bicarb 24, glucose 115, BUN 73, creatinine 3.23, BNP 2,193, high sensitive troponin 35-33, white count 6.8, hemoglobin 9.3, hematocrit 32.0, platelets 73. SARS COVID-19 negative  Urinalysis specific gravity 1.010.  Chest radiograph with small bilateral pleural effusions, increased interstitial vascular infiltrates, hilar vascular congestion, positive cardiomegaly.  60 bpm, right axis deviation, normal QTC, first-degree AV block with sinus arrhythmia, no ST segment changes, negative T wave lead II, lead III, aVF, V4-V6  Assessment & Plan:   Principal Problem:   Acute CHF (congestive heart failure) (HCC) Active Problems:   Hypertension   Ischemic cardiomyopathy   Dyslipidemia   Diabetes mellitus, type II, insulin dependent (HCC)   OSA (obstructive sleep apnea)   PAD (peripheral artery disease) (HCC)   Cellulitis   CKD (chronic kidney  disease) stage 4, GFR 15-29 ml/min (HCC)   Acute on chronic systolic heart failure decompensation. Patient continue to have peripheral edema.  Urine output documented to be 300, has warm extremities, and systolic blood pressure 951 bpm.   Plan to increase furosemide to 80 mg IV q12 hrs to target further negative fluid balance. Hold on carvedilol due to bradycardia, hold RAAS blockade for now due to risk of hypotension. Follow with echocardiography   2. Bradycardia, 1st degree AV block with sinus arrhythmia.  Check EKG today, and follow up with echocardiogram. Continue to hold on carvedilol for now.   3. Right leg cellulitis/ PAD. Present on admission, no sepsis. Patient at home on doxycycline and amoxicillin.   Plan to start patient on oral cephalexin for now and continue close monitoring.   4. T2DM/ dyslipidemia. Continue glucose control with insulin sliding scale, he is tolerating po well.   Continue with atorvastatin.   5. CKD stage 4 (base cr 3,0). Renal function with serum cr at 3,0, K is 4,5 and serum bicarbonate at 25. Will continue diuresis with furosemide and follow up renal function in am, avoid hypotension and nephrotoxic medications.    6. Iron deficiency anemia. Continue with oral iron supplementation.   Patient continue to be at high risk for worsening heart failure   Status is: Inpatient  Remains inpatient appropriate because:IV treatments appropriate due to intensity of illness or inability to take PO  Dispo: The patient is from: Home              Anticipated d/c is to: Home              Patient currently is not medically stable to d/c.   Difficult  to place patient No   DVT prophylaxis: Heparin    Code Status:    DNR  Family Communication:   I spoke over the phone with the patient's brother about patient's  condition, plan of care, prognosis and all questions were addressed.   Antimicrobials:  Cephalexin     Subjective: Patient continue to have dyspnea,  not back to baseline, no chest pain. Positive right leg pain, no nausea or vomiting, poor mobility, at home uses a walker.   Objective: Vitals:   04/10/21 0155 04/10/21 0345 04/10/21 0810 04/10/21 1142  BP:  (!) 115/59 122/60 (!) 115/94  Pulse:  (!) 51 (!) 58 (!) 59  Resp:  14    Temp:  98.3 F (36.8 C) 98.3 F (36.8 C)   TempSrc:  Oral Oral   SpO2:  98% 100% 94%  Weight: 97 kg     Height:        Intake/Output Summary (Last 24 hours) at 04/10/2021 1243 Last data filed at 04/10/2021 0810 Gross per 24 hour  Intake 600 ml  Output 300 ml  Net 300 ml   Filed Weights   04/09/21 2151 04/10/21 0155  Weight: 100.7 kg 97 kg    Examination:   General: Not in pain or dyspnea, deconditioned  Neurology: Awake and alert, non focal  E ENT: positive pallor, no icterus, oral mucosa moist Cardiovascular: No JVD. S1-S2 present, rhythmic, no gallops, rubs, or murmurs. ++ pitting bilateral lower extremity edema. Pulmonary: positive breath sounds bilaterally,with wheezing, rhonchi scattered rales on anterior auscultation. Gastrointestinal. Abdomen distended, positive abdominal hernia, soft and on tender Skin. No rashes Musculoskeletal: no joint deformities       Data Reviewed: I have personally reviewed following labs and imaging studies  CBC: Recent Labs  Lab 04/09/21 1208  WBC 6.8  NEUTROABS 4.7  HGB 9.3*  HCT 32.0*  MCV 100.0  PLT 73*   Basic Metabolic Panel: Recent Labs  Lab 04/09/21 1208 04/10/21 0301  NA 134* 134*  K 5.0 4.5  CL 101 103  CO2 24 25  GLUCOSE 115* 159*  BUN 73* 73*  CREATININE 3.23* 3.03*  CALCIUM 8.6* 8.4*   GFR: Estimated Creatinine Clearance: 21.1 mL/min (A) (by C-G formula based on SCr of 3.03 mg/dL (H)). Liver Function Tests: Recent Labs  Lab 04/09/21 1208  AST 30  ALT 11  ALKPHOS 89  BILITOT 1.2  PROT 6.8  ALBUMIN 3.0*   No results for input(s): LIPASE, AMYLASE in the last 168 hours. No results for input(s): AMMONIA in the last 168  hours. Coagulation Profile: No results for input(s): INR, PROTIME in the last 168 hours. Cardiac Enzymes: No results for input(s): CKTOTAL, CKMB, CKMBINDEX, TROPONINI in the last 168 hours. BNP (last 3 results) No results for input(s): PROBNP in the last 8760 hours. HbA1C: No results for input(s): HGBA1C in the last 72 hours. CBG: Recent Labs  Lab 04/09/21 1414 04/10/21 0616 04/10/21 1047  GLUCAP 134* 145* 213*   Lipid Profile: No results for input(s): CHOL, HDL, LDLCALC, TRIG, CHOLHDL, LDLDIRECT in the last 72 hours. Thyroid Function Tests: No results for input(s): TSH, T4TOTAL, FREET4, T3FREE, THYROIDAB in the last 72 hours. Anemia Panel: No results for input(s): VITAMINB12, FOLATE, FERRITIN, TIBC, IRON, RETICCTPCT in the last 72 hours.    Radiology Studies: I have reviewed all of the imaging during this hospital visit personally     Scheduled Meds:  allopurinol  100 mg Oral Daily   aspirin EC  81 mg Oral  QHS   atorvastatin  20 mg Oral Daily   carvedilol  3.125 mg Oral BID WC   cholecalciferol  2,000 Units Oral Q M,W,F   febuxostat  40 mg Oral q morning   ferrous sulfate  325 mg Oral Q M,W,F   furosemide  40 mg Intravenous BID   insulin aspart  0-9 Units Subcutaneous TID WC   latanoprost  1 drop Both Eyes QHS   multivitamin with minerals  1 tablet Oral Daily   pantoprazole  40 mg Oral BID   saccharomyces boulardii  250 mg Oral BID   sodium chloride flush  3 mL Intravenous Q12H   tamsulosin  0.4 mg Oral QHS   Continuous Infusions:  sodium chloride       LOS: 1 day        Dayelin Balducci Gerome Apley, MD

## 2021-04-10 NOTE — Consult Note (Signed)
   [facility] Inpatient Consult   04/10/2021  [name] September 04, 1933 [id number]  [employer] [[facility]] Patient: Scott [name]   Primary Care Provider:  Reubin Milan, MD, [facility] and [employer]  Patient screened for less than 30 days readmission hospitalization with noted high risk score for unplanned readmission risk and  to assess for potential [place] [facility] service needs for post hospital transition.  Review of patient's medical record of [facility] team notes reveals patient is from home alone.  Plan:  Continue to follow progress and disposition to assess for post hospital care management needs.    For questions contact:   Natividad Brood, RN BSN [facility] Liaison  [phone] business mobile phone Toll free office [phone]  Fax number: [phone] [email or web address]

## 2021-04-11 LAB — CBC WITH DIFFERENTIAL/PLATELET
Abs Immature Granulocytes: 0.03 10*3/uL (ref 0.00–0.07)
Basophils Absolute: 0.1 10*3/uL (ref 0.0–0.1)
Basophils Relative: 1 %
Eosinophils Absolute: 0.3 10*3/uL (ref 0.0–0.5)
Eosinophils Relative: 6 %
HCT: 28.6 % — ABNORMAL LOW (ref 39.0–52.0)
Hemoglobin: 8.4 g/dL — ABNORMAL LOW (ref 13.0–17.0)
Immature Granulocytes: 1 %
Lymphocytes Relative: 20 %
Lymphs Abs: 1.1 10*3/uL (ref 0.7–4.0)
MCH: 28.8 pg (ref 26.0–34.0)
MCHC: 29.4 g/dL — ABNORMAL LOW (ref 30.0–36.0)
MCV: 97.9 fL (ref 80.0–100.0)
Monocytes Absolute: 1.2 10*3/uL — ABNORMAL HIGH (ref 0.1–1.0)
Monocytes Relative: 21 %
Neutro Abs: 2.9 10*3/uL (ref 1.7–7.7)
Neutrophils Relative %: 51 %
Platelets: 64 10*3/uL — ABNORMAL LOW (ref 150–400)
RBC: 2.92 MIL/uL — ABNORMAL LOW (ref 4.22–5.81)
RDW: 17.3 % — ABNORMAL HIGH (ref 11.5–15.5)
WBC: 5.6 10*3/uL (ref 4.0–10.5)
nRBC: 0 % (ref 0.0–0.2)

## 2021-04-11 LAB — BASIC METABOLIC PANEL
Anion gap: 6 (ref 5–15)
BUN: 73 mg/dL — ABNORMAL HIGH (ref 8–23)
CO2: 26 mmol/L (ref 22–32)
Calcium: 8.3 mg/dL — ABNORMAL LOW (ref 8.9–10.3)
Chloride: 102 mmol/L (ref 98–111)
Creatinine, Ser: 3 mg/dL — ABNORMAL HIGH (ref 0.61–1.24)
GFR, Estimated: 20 mL/min — ABNORMAL LOW (ref >60)
Glucose, Bld: 138 mg/dL — ABNORMAL HIGH (ref 70–99)
Potassium: 4.8 mmol/L (ref 3.5–5.1)
Sodium: 134 mmol/L — ABNORMAL LOW (ref 135–145)

## 2021-04-11 LAB — GLUCOSE, CAPILLARY
Glucose-Capillary: 126 mg/dL — ABNORMAL HIGH (ref 70–99)
Glucose-Capillary: 135 mg/dL — ABNORMAL HIGH (ref 70–99)
Glucose-Capillary: 180 mg/dL — ABNORMAL HIGH (ref 70–99)
Glucose-Capillary: 144 mg/dL — ABNORMAL HIGH (ref 70–99)

## 2021-04-11 MED ORDER — FUROSEMIDE 10 MG/ML IJ SOLN
80.0000 mg | Freq: Three times a day (TID) | INTRAMUSCULAR | Status: DC
  Administered 2021-04-11 – 2021-04-14 (×9): 80 mg via INTRAVENOUS
  Filled 2021-04-11 (×9): qty 8

## 2021-04-11 NOTE — Progress Notes (Signed)
PROGRESS NOTE    ROCHELLE NEPHEW  BOF:[id number] DOB: [birth year] DOA: 04/09/2021 PCP: Reubin Milan, MD    Brief Narrative:  Mr. Berry was admitted to the hospital with the working diagnosis of acute decompensation of chronic systolic heart failure, complicated with right leg cellulits.    87 year old male past medical history for systolic heart failure, coronary artery disease, hypertension, chronic kidney disease, dyslipidemia, GERD, chronic iron deficiency anemia and history of GI bleed who presented with dyspnea and lower extremity edema.  He reported progressive symptoms for the last 2 to 3 weeks, gained about 7 to 8 pounds in the last 4 weeks.  His symptoms were refractive to an increased dose of furosemide at home.  On his initial physical examination blood pressure 139/72, heart rate 52, respiratory rate 15, oxygen saturation 100% on supplemental oxygen, his lungs had rales bilaterally at bases, heart S1-S2, present, rhythmic, abdomen soft and nontender, positive pitting bilateral extremity edema 2+.    Sodium 134, potassium 5.0, chloride 101, bicarb 24, glucose 115, BUN 73, creatinine 3.23, BNP 2,193, high sensitive troponin 35-33, white count 6.8, hemoglobin 9.3, hematocrit 32.0, platelets 73. SARS COVID-19 negative   Urinalysis specific gravity 1.010.   Chest radiograph with small bilateral pleural effusions, increased interstitial vascular infiltrates, hilar vascular congestion, positive cardiomegaly.   60 bpm, right axis deviation, normal QTC, first-degree AV block with sinus arrhythmia, no ST segment changes, negative T wave lead II, lead III, aVF, V4-V6     Assessment & Plan:   Principal Problem:   Acute CHF (congestive heart failure) (HCC) Active Problems:   Hypertension   Ischemic cardiomyopathy   Dyslipidemia   Diabetes mellitus, type II, insulin dependent (HCC)   OSA (obstructive sleep apnea)   PAD (peripheral artery disease) (HCC)   Cellulitis   CKD (chronic  kidney disease) stage 4, GFR 15-29 ml/min (HCC)   Acute on chronic systolic heart failure decompensation. Biventricular failure, acute on chronic core pulmonale with class 2 pulmonary hypertension.  Slowly improving lower extremity edema but not yet back to baseline. Continue to have dyspnea.  Urine output documented to 875 ml over last 24 hrs.    Increase furosemide to 80 mg IV q8 hrs. Continue to hold on carvedilol due to bradycardia, hold RAAS  blockade due to risk of hypotension. Holding spironolactone due to risk of hyperkalemia.   Echocardiogram with EF 25 to 30% with global hypokinesis, moderate dilatation of LV cavity. RV systolic function with severe reduction. RV cavity severely enlarged. Elevated pulmonary systolic pressure.    2. Bradycardia, 1st degree AV block with sinus arrhythmia. Follow up EKG personally reviewed with HR 47 bpm, normal axis, qtc 460, right bundle branch block, first degree AV block no significant ST segment or T wave changes.   Continue telemetry monitoring and diuresis, hold on AV blockers, Keep K at 4 and Mg at 2. May qualify to resynchronization therapy    3. Right leg cellulitis/ PAD. No sepsis. At home on doxycycline and amoxicillin.   Rash stable and not worsening, will continue antibiotic therapy with cephalexin for now.    4. T2DM/ dyslipidemia. On insulin sliding scale, he is tolerating po well. Fasting glucose 130 mg/dl/    On atorvastatin.   5. CKD stage 4 (base cr 3,0).  His renal function continue to be stable with serum cr at 3,0 with K at 4,8 and serum bicarbonate at 26. Avoid hypotension and nephrotoxic medications.  Continue diuresis for hypervolemia with furosemide    6.  Iron deficiency anemia. oral iron supplementation.   7. Gout. No acute flare, continue with allopurinol and cholchicine / uloric    Patient continue to be at high risk for worsening heart failure   Status is: Inpatient  Remains inpatient appropriate  because:Inpatient level of care appropriate due to severity of illness  Dispo: The patient is from: SNF              Anticipated d/c is to: SNF              Patient currently is not medically stable to d/c.   Difficult to place patient No   DVT prophylaxis: Enoxaparin   Code Status:   DNR Family Communication:  No family at the bedside    Antimicrobials:  Cephalexin     Subjective: Patient continue to have dyspnea and lower extremity edema, improved but not yet back to baseline, no nausea or vomiting.   Objective: Vitals:   04/10/21 1645 04/10/21 1956 04/11/21 0346 04/11/21 0739  BP:  121/69 (!) 102/46 (!) 124/56  Pulse:  (!) 51 (!) 51 (!) 56  Resp:  18 18   Temp:  97.6 F (36.4 C) 97.8 F (36.6 C) 98.6 F (37 C)  TempSrc:  Oral Oral Oral  SpO2: 100% 100% 100% 99%  Weight:   97.2 kg   Height:        Intake/Output Summary (Last 24 hours) at 04/11/2021 1138 Last data filed at 04/11/2021 1038 Gross per 24 hour  Intake 840 ml  Output 1100 ml  Net -260 ml   Filed Weights   04/09/21 2151 04/10/21 0155 04/11/21 0346  Weight: 100.7 kg 97 kg 97.2 kg    Examination:   General: Not in pain or dyspnea, deconditioned  Neurology: Awake and alert, non focal  E ENT: mild pallor, no icterus, oral mucosa moist Cardiovascular: No JVD. S1-S2 present, rhythmic, no gallops, rubs, or murmurs. ++ pitting more right than left. Pulmonary: positive breath sounds bilaterally, with no wheezing, but bibasilar rales. Gastrointestinal. Abdomen soft and non tender Skin. Stable right anterior leg rash, increased local temperature.  Musculoskeletal: no joint deformities     Data Reviewed: I have personally reviewed following labs and imaging studies  CBC: Recent Labs  Lab 04/09/21 1208 04/11/21 0321  WBC 6.8 5.6  NEUTROABS 4.7 2.9  HGB 9.3* 8.4*  HCT 32.0* 28.6*  MCV 100.0 97.9  PLT 73* 64*   Basic Metabolic Panel: Recent Labs  Lab 04/09/21 1208 04/10/21 0301 04/11/21 0321   NA 134* 134* 134*  K 5.0 4.5 4.8  CL 101 103 102  CO2 24 25 26   GLUCOSE 115* 159* 138*  BUN 73* 73* 73*  CREATININE 3.23* 3.03* 3.00*  CALCIUM 8.6* 8.4* 8.3*   GFR: Estimated Creatinine Clearance: 21.4 mL/min (A) (by C-G formula based on SCr of 3 mg/dL (H)). Liver Function Tests: Recent Labs  Lab 04/09/21 1208  AST 30  ALT 11  ALKPHOS 89  BILITOT 1.2  PROT 6.8  ALBUMIN 3.0*   No results for input(s): LIPASE, AMYLASE in the last 168 hours. No results for input(s): AMMONIA in the last 168 hours. Coagulation Profile: No results for input(s): INR, PROTIME in the last 168 hours. Cardiac Enzymes: No results for input(s): CKTOTAL, CKMB, CKMBINDEX, TROPONINI in the last 168 hours. BNP (last 3 results) No results for input(s): PROBNP in the last 8760 hours. HbA1C: No results for input(s): HGBA1C in the last 72 hours. CBG: Recent Labs  Lab 04/10/21 [phone]  04/10/21 1047 04/10/21 1526 04/10/21 2108 04/11/21 0523  GLUCAP 145* 213* 171* 169* 126*   Lipid Profile: No results for input(s): CHOL, HDL, LDLCALC, TRIG, CHOLHDL, LDLDIRECT in the last 72 hours. Thyroid Function Tests: No results for input(s): TSH, T4TOTAL, FREET4, T3FREE, THYROIDAB in the last 72 hours. Anemia Panel: No results for input(s): VITAMINB12, FOLATE, FERRITIN, TIBC, IRON, RETICCTPCT in the last 72 hours.    Radiology Studies: I have reviewed all of the imaging during this hospital visit personally     Scheduled Meds:  allopurinol  100 mg Oral Daily   aspirin EC  81 mg Oral QHS   atorvastatin  20 mg Oral Daily   cephALEXin  250 mg Oral Q8H   cholecalciferol  2,000 Units Oral Q M,W,F   febuxostat  40 mg Oral q morning   ferrous sulfate  325 mg Oral Q M,W,F   furosemide  80 mg Intravenous BID   insulin aspart  0-9 Units Subcutaneous TID WC   latanoprost  1 drop Both Eyes QHS   multivitamin with minerals  1 tablet Oral Daily   pantoprazole  40 mg Oral BID   saccharomyces boulardii  250 mg Oral  BID   sodium chloride flush  3 mL Intravenous Q12H   tamsulosin  0.4 mg Oral QHS   Continuous Infusions:  sodium chloride       LOS: 2 days        Aletta Edmunds Gerome Apley, MD

## 2021-04-12 DIAGNOSIS — G4733 Obstructive sleep apnea (adult) (pediatric): Secondary | ICD-10-CM

## 2021-04-12 LAB — BASIC METABOLIC PANEL
Anion gap: 7 (ref 5–15)
BUN: 69 mg/dL — ABNORMAL HIGH (ref 8–23)
CO2: 29 mmol/L (ref 22–32)
Calcium: 8.5 mg/dL — ABNORMAL LOW (ref 8.9–10.3)
Chloride: 100 mmol/L (ref 98–111)
Creatinine, Ser: 2.73 mg/dL — ABNORMAL HIGH (ref 0.61–1.24)
GFR, Estimated: 22 mL/min — ABNORMAL LOW (ref >60)
Glucose, Bld: 130 mg/dL — ABNORMAL HIGH (ref 70–99)
Potassium: 4.3 mmol/L (ref 3.5–5.1)
Sodium: 136 mmol/L (ref 135–145)

## 2021-04-12 LAB — GLUCOSE, CAPILLARY
Glucose-Capillary: 132 mg/dL — ABNORMAL HIGH (ref 70–99)
Glucose-Capillary: 144 mg/dL — ABNORMAL HIGH (ref 70–99)

## 2021-04-12 LAB — MAGNESIUM: Magnesium: 1.9 mg/dL (ref 1.7–2.4)

## 2021-04-12 MED ORDER — BACITRACIN ZINC 500 UNIT/GM EX OINT
TOPICAL_OINTMENT | Freq: Two times a day (BID) | CUTANEOUS | Status: DC
  Administered 2021-04-13: 1 via TOPICAL
  Filled 2021-04-12: qty 28.4

## 2021-04-12 NOTE — Progress Notes (Signed)
PROGRESS NOTE    Scott [name]  TFT:[id number] DOB: Jun 25, 1933 DOA: 04/09/2021 PCP: Reubin Milan, MD    Brief Narrative:  Scott [name] was admitted to the hospital with the working diagnosis of acute decompensation of chronic systolic heart failure, complicated with right leg cellulits.    87 year old male past medical history for systolic heart failure, coronary artery disease, hypertension, chronic kidney disease, dyslipidemia, GERD, chronic iron deficiency anemia and history of GI bleed who presented with dyspnea and lower extremity edema.  He reported progressive symptoms for the last 2 to 3 weeks, gained about 7 to 8 pounds in the last 4 weeks.  His symptoms were refractive to an increased dose of furosemide at home.  On his initial physical examination blood pressure 139/72, heart rate 52, respiratory rate 15, oxygen saturation 100% on supplemental oxygen, his lungs had rales bilaterally at bases, heart S1-S2, present, rhythmic, abdomen soft and nontender, positive pitting bilateral extremity edema 2+.    Sodium 134, potassium 5.0, chloride 101, bicarb 24, glucose 115, BUN 73, creatinine 3.23, BNP 2,193, high sensitive troponin 35-33, white count 6.8, hemoglobin 9.3, hematocrit 32.0, platelets 73. SARS COVID-19 negative   Urinalysis specific gravity 1.010.   Chest radiograph with small bilateral pleural effusions, increased interstitial vascular infiltrates, hilar vascular congestion, positive cardiomegaly.   EKG 60 bpm, right axis deviation, normal QTC, first-degree AV block with sinus arrhythmia, no ST segment changes, negative T wave lead II, lead III, aVF, V4-V6   Patient placed on high doses of IV furosemide with good toleration. Slowly improving volume status.   Oral antibiotic therapy for right leg cellulitis with good response.    Assessment & Plan:   Principal Problem:   Acute CHF (congestive heart failure) (HCC) Active Problems:   Hypertension   Ischemic  cardiomyopathy   Dyslipidemia   Diabetes mellitus, type II, insulin dependent (HCC)   OSA (obstructive sleep apnea)   PAD (peripheral artery disease) (HCC)   Cellulitis   CKD (chronic kidney disease) stage 4, GFR 15-29 ml/min (HCC)     Acute on chronic systolic heart failure decompensation. Biventricular failure, acute on chronic core pulmonale with class 2 pulmonary hypertension. This am edema and dyspnea improved, but not yet back to baseline. Urine output over last 24 hrs 2,500 ml.    Echocardiogram with EF 25 to 30% with global hypokinesis, moderate dilatation of LV cavity. RV systolic function with severe reduction. RV cavity severely enlarged. Elevated pulmonary systolic pressure.   His blood pressure has improved, stable at 706 mmHg systolic. Continue to hold on after load reduction for now. Continue with furosemide 80 mg IV q 8 hrs.    2. Bradycardia, 1st degree AV block with sinus arrhythmia. Follow up EKG personally reviewed with HR 47 bpm, normal axis, qtc 460, right bundle branch block, first degree AV block no significant ST segment or T wave changes.   HR has been stable in th 60's range, continue to hold on AV blockade.    3. Right leg cellulitis/ PAD. No sepsis. At home on doxycycline and amoxicillin.   Clinically rash is better today, will continue with cephalexin for now, plan 7 days therapy.  Continue local skin care. Add bacitracin topical.    4. T2DM/ dyslipidemia.  glucose continue to be stable, fasting 130 and capillary 132 and 144,  Holding on SGLT2 inhibitor due to low GFR Continue with atorvastatin.   5. CKD stage 4 (base cr 3,0).  Improved urine output with IV furosemide 80 mg  IV q8 hrs, renal function with serum cr down to 2.73 with K at 4,3 and serum bicarbonate at 29.  Continue with furosemide for decongestion, follow with renal function in am, avoid hypotension and nephrotoxic medications.    6. Iron deficiency anemia. Continue with ron  supplementation.    7. Gout. No signs of acute flare, on allopurinol, cholchicine and uloric    Patient continue to be at high risk for worsening hear failure   Status is: Inpatient  Remains inpatient appropriate because:Inpatient level of care appropriate due to severity of illness  Dispo: The patient is from: Home              Anticipated d/c is to: Home              Patient currently is not medically stable to d/c.   Difficult to place patient No    DVT prophylaxis: Heparin   Code Status:   full  Family Communication:  No family at the bedside      Antimicrobials:  Cephalexin     Subjective: Patient lower extremity edema and right leg rash are improving, improved dyspnea and now able to walk in the hallway with his walker, no chest pain, no nausea or vomiting.   Objective: Vitals:   04/11/21 2353 04/12/21 0445 04/12/21 0651 04/12/21 1141  BP: (!) 121/50 (!) 99/55 (!) 115/58 (!) 126/59  Pulse: (!) 59 61 (!) 57 (!) 57  Resp:  18  18  Temp:  97.8 F (36.6 C)  (!) 97.5 F (36.4 C)  TempSrc:  Oral  Oral  SpO2:  93%  100%  Weight:  95.7 kg    Height:        Intake/Output Summary (Last 24 hours) at 04/12/2021 1237 Last data filed at 04/12/2021 1142 Gross per 24 hour  Intake 940 ml  Output 2575 ml  Net -1635 ml   Filed Weights   04/10/21 0155 04/11/21 0346 04/12/21 0445  Weight: 97 kg 97.2 kg 95.7 kg    Examination:   General: Not in pain or dyspnea, deconditioned  Neurology: Awake and alert, non focal  E ENT: no pallor, no icterus, oral mucosa moist Cardiovascular: No JVD. S1-S2 present, rhythmic, no gallops, rubs, or murmurs. ++ pitting bilateral lower extremity edema, more right than left.  Pulmonary: positive breath sounds bilaterally, adequate air movement, no wheezing, rhonchi or rales. Gastrointestinal. Abdomen soft and non tender Skin. Right anterior leg rash is improving, decrease size and intensity.  Musculoskeletal: no joint  deformities     Data Reviewed: I have personally reviewed following labs and imaging studies  CBC: Recent Labs  Lab 04/09/21 1208 04/11/21 0321  WBC 6.8 5.6  NEUTROABS 4.7 2.9  HGB 9.3* 8.4*  HCT 32.0* 28.6*  MCV 100.0 97.9  PLT 73* 64*   Basic Metabolic Panel: Recent Labs  Lab 04/09/21 1208 04/10/21 0301 04/11/21 0321 04/12/21 0401  NA 134* 134* 134* 136  K 5.0 4.5 4.8 4.3  CL 101 103 102 100  CO2 24 25 26 29   GLUCOSE 115* 159* 138* 130*  BUN 73* 73* 73* 69*  CREATININE 3.23* 3.03* 3.00* 2.73*  CALCIUM 8.6* 8.4* 8.3* 8.5*  MG  --   --   --  1.9   GFR: Estimated Creatinine Clearance: 23.3 mL/min (A) (by C-G formula based on SCr of 2.73 mg/dL (H)). Liver Function Tests: Recent Labs  Lab 04/09/21 1208  AST 30  ALT 11  ALKPHOS 89  BILITOT 1.2  PROT 6.8  ALBUMIN 3.0*   No results for input(s): LIPASE, AMYLASE in the last 168 hours. No results for input(s): AMMONIA in the last 168 hours. Coagulation Profile: No results for input(s): INR, PROTIME in the last 168 hours. Cardiac Enzymes: No results for input(s): CKTOTAL, CKMB, CKMBINDEX, TROPONINI in the last 168 hours. BNP (last 3 results) No results for input(s): PROBNP in the last 8760 hours. HbA1C: No results for input(s): HGBA1C in the last 72 hours. CBG: Recent Labs  Lab 04/11/21 1158 04/11/21 1538 04/11/21 2125 04/12/21 0547 04/12/21 1136  GLUCAP 135* 180* 144* 132* 144*   Lipid Profile: No results for input(s): CHOL, HDL, LDLCALC, TRIG, CHOLHDL, LDLDIRECT in the last 72 hours. Thyroid Function Tests: No results for input(s): TSH, T4TOTAL, FREET4, T3FREE, THYROIDAB in the last 72 hours. Anemia Panel: No results for input(s): VITAMINB12, FOLATE, FERRITIN, TIBC, IRON, RETICCTPCT in the last 72 hours.    Radiology Studies: I have reviewed all of the imaging during this hospital visit personally     Scheduled Meds:  allopurinol  100 mg Oral Daily   aspirin EC  81 mg Oral QHS    atorvastatin  20 mg Oral Daily   cephALEXin  250 mg Oral Q8H   cholecalciferol  2,000 Units Oral Q M,W,F   febuxostat  40 mg Oral q morning   ferrous sulfate  325 mg Oral Q M,W,F   furosemide  80 mg Intravenous TID   insulin aspart  0-9 Units Subcutaneous TID WC   latanoprost  1 drop Both Eyes QHS   multivitamin with minerals  1 tablet Oral Daily   pantoprazole  40 mg Oral BID   saccharomyces boulardii  250 mg Oral BID   sodium chloride flush  3 mL Intravenous Q12H   tamsulosin  0.4 mg Oral QHS   Continuous Infusions:  sodium chloride       LOS: 3 days        Scott Dissinger Gerome Apley, MD

## 2021-04-12 NOTE — Plan of Care (Signed)
  Problem: Education: Goal: Ability to demonstrate management of disease process will improve Outcome: Progressing   Problem: Education: Goal: Ability to verbalize understanding of medication therapies will improve Outcome: Progressing   Problem: Activity: Goal: Capacity to carry out activities will improve Outcome: Progressing   Problem: Cardiac: Goal: Ability to achieve and maintain adequate cardiopulmonary perfusion will improve Outcome: Progressing   

## 2021-04-13 LAB — BASIC METABOLIC PANEL WITH GFR
Anion gap: 9 (ref 5–15)
BUN: 68 mg/dL — ABNORMAL HIGH (ref 8–23)
CO2: 28 mmol/L (ref 22–32)
Calcium: 8.6 mg/dL — ABNORMAL LOW (ref 8.9–10.3)
Chloride: 98 mmol/L (ref 98–111)
Creatinine, Ser: 2.68 mg/dL — ABNORMAL HIGH (ref 0.61–1.24)
GFR, Estimated: 22 mL/min — ABNORMAL LOW
Glucose, Bld: 167 mg/dL — ABNORMAL HIGH (ref 70–99)
Potassium: 4.2 mmol/L (ref 3.5–5.1)
Sodium: 135 mmol/L (ref 135–145)

## 2021-04-13 LAB — GLUCOSE, CAPILLARY
Glucose-Capillary: 146 mg/dL — ABNORMAL HIGH (ref 70–99)
Glucose-Capillary: 200 mg/dL — ABNORMAL HIGH (ref 70–99)

## 2021-04-13 NOTE — Evaluation (Signed)
Occupational Therapy Evaluation Patient Details Name: Scott [name] MRN: [id number] DOB: [birth year] Today's Date: 04/13/2021    History of Present Illness 87 y.o. male presents to [facility] ED on 04/09/2021 for shortness of breath and LE edema.  Pt admitted for management of acute on chronic systolic heart failure. Pt also with R LE cellulitis. PMH: chronic thrombocytopenia, diabetes mellitus type 2, essential hypertension chronic kidney disease stage III, chronic combined systolic and diastolic heart failure iron deficiency anemia   Clinical Impression   PTA, pt lives alone and reports Modified Independence with ADLs and mobility using cane typically. With recent LE swelling, pt has been using Rollator due to difficulty ambulating. Pt reports recently discharging home from [place] re[facility]. Pt presents now with deficits in dynamic standing balance and endurance, reliant on RW use for stability due to R LE cellulitis. Pt overall Independent for UB ADLs, min guard for LB ADLs, and Supervision for short mobility using RW. Reinforced CHF mgmt strategies in daily routine, as well as energy conservation. Recommend HHOT follow-up to maximize independence and safety in home environment.      Follow Up Recommendations  Home health OT;Supervision - Intermittent    Equipment Recommendations  Other (comment) (Rolling walker)    Recommendations for Other Services       Precautions / Restrictions Precautions Precautions: Fall Restrictions Weight Bearing Restrictions: No      Mobility Bed Mobility Overal bed mobility: Modified Independent                  Transfers Overall transfer level: Needs assistance Equipment used: None;Rolling walker (2 wheeled) Transfers: Sit to/from Stand Sit to Stand: Supervision         General transfer comment: supervision for safety, improved stability with DME use    Balance Overall balance assessment: Needs assistance Sitting-balance support: No upper  extremity supported;Feet supported Sitting balance-Leahy Scale: Good     Standing balance support: Bilateral upper extremity supported;During functional activity Standing balance-Leahy Scale: Fair Standing balance comment: fair static standing at sink, need for UE support during mobility                           ADL either performed or assessed with clinical judgement   ADL Overall ADL's : Needs assistance/impaired Eating/Feeding: Independent;Sitting   Grooming: Set up;Standing;Wash/dry face;Brushing hair Grooming Details (indicate cue type and reason): no LOB or difficulty sequencing noted Upper Body Bathing: Independent;Sitting   Lower Body Bathing: Min guard;Sit to/from stand   Upper Body Dressing : Independent;Sitting   Lower Body Dressing: Min guard;Sit to/from stand   Toilet Transfer: Supervision/safety;Ambulation;RW   Toileting- Clothing Manipulation and Hygiene: Supervision/safety;Sit to/from stand         General ADL Comments: Pt with minor standing balance deficits in comparison to baseline, reliant on RW use at this time due to R LE cellulitis. Educated/reinforced on CHF mgmt strategies and importance of daily weights     Vision Baseline Vision/History: Wears glasses Wears Glasses: Reading only Patient Visual Report: No change from baseline Vision Assessment?: No apparent visual deficits     Perception     Praxis      Pertinent Vitals/Pain Pain Assessment: Faces Faces Pain Scale: Hurts a little bit Pain Location: R LE Pain Descriptors / Indicators: Grimacing Pain Intervention(s): Monitored during session     Hand Dominance Right   Extremity/Trunk Assessment Upper Extremity Assessment Upper Extremity Assessment: Overall WFL for tasks assessed   Lower Extremity Assessment  Lower Extremity Assessment: Defer to PT evaluation   Cervical / Trunk Assessment Cervical / Trunk Assessment: Normal   Communication Communication Communication:  No difficulties   Cognition Arousal/Alertness: Awake/alert Behavior During Therapy: WFL for tasks assessed/performed Overall Cognitive Status: Within Functional Limits for tasks assessed                                     General Comments  VSS on RA    Exercises     Shoulder Instructions      Home Living Family/patient expects to be discharged to:: Private residence Living Arrangements: Alone Available Help at Discharge: Family;Available PRN/intermittently Type of Home: House Home Access: Stairs to enter [employer] of Steps: 1   Home Layout: One level     Bathroom Shower/Tub: Teacher, early years/pre: Standard Bathroom Accessibility: Yes   Home Equipment: Cane - single point;Walker - 4 wheels;Shower seat          Prior Functioning/Environment Level of Independence: Needs assistance  Gait / Transfers Assistance Needed: typically uses cane but with increasing LE edema, used Rollator. Pt reports using cane vs walker depending on how he felt ADL's / Homemaking Assistance Needed: Pt reports typically Modified Independent with ADLs, brother recently gave him a shower chair to use. Pt cn cook microwave meals, manages medication. Pt's brother assists with laundry, grocery shopping and transportation   Comments: denies any recent falls. Pt reports recently DC home from [facility] rehab, was not home long enough for [facility] services to start        OT Problem List: Decreased strength;Impaired balance (sitting and/or standing);Decreased activity tolerance;Increased edema;Decreased knowledge of use of DME or AE      OT Treatment/Interventions: Self-care/ADL training;Therapeutic exercise;Energy conservation;DME and/or AE instruction;Therapeutic activities;Patient/family education;Balance training    OT Goals(Current goals can be found in the care plan section) Acute Rehab OT Goals Patient Stated Goal: decrease swelling, go home OT Goal  Formulation: With patient Time For Goal Achievement: 04/27/21 Potential to Achieve Goals: Good  OT Frequency: Min 2X/week   Barriers to D/C:            Co-evaluation              AM-PAC OT "6 Clicks" Daily Activity     Outcome Measure Help from another person eating meals?: None Help from another person taking care of personal grooming?: A Little Help from another person toileting, which includes using toliet, bedpan, or urinal?: A Little Help from another person bathing (including washing, rinsing, drying)?: A Little Help from another person to put on and taking off regular upper body clothing?: None Help from another person to put on and taking off regular lower body clothing?: A Little 6 Click Score: 20   End of Session Equipment Utilized During Treatment: Rolling walker Nurse Communication: Mobility status  Activity Tolerance: Patient tolerated treatment well Patient left: in chair;with call bell/phone within reach  OT Visit Diagnosis: Unsteadiness on feet (R26.81);Other abnormalities of gait and mobility (R26.89)                Time: 8144-8185 OT Time Calculation (min): 20 min Charges:  OT General Charges $OT Visit: 1 Visit OT Evaluation $OT Eval Low Complexity: 1 Low  Malachy Chamber, OTR/L Acute Rehab Services Office: [phone]   Layla Maw 04/13/2021, 10:00 AM

## 2021-04-13 NOTE — Progress Notes (Signed)
PROGRESS NOTE    Scott [name]  ZOX:[id number] DOB: 04/22/1933 DOA: 04/09/2021 PCP: Reubin Milan, MD    Brief Narrative:  Scott [name] was admitted to the hospital with the working diagnosis of acute decompensation of chronic systolic heart failure, complicated with right leg cellulits.    87 year old male past medical history for systolic heart failure, coronary artery disease, hypertension, chronic kidney disease, dyslipidemia, GERD, chronic iron deficiency anemia and history of GI bleed who presented with dyspnea and lower extremity edema.  He reported progressive symptoms for the last 2 to 3 weeks, gained about 7 to 8 pounds in the last 4 weeks.  His symptoms were refractive to an increased dose of furosemide at home.  On his initial physical examination blood pressure 139/72, heart rate 52, respiratory rate 15, oxygen saturation 100% on supplemental oxygen, his lungs had rales bilaterally at bases, heart S1-S2, present, rhythmic, abdomen soft and nontender, positive pitting bilateral extremity edema 2+.    Sodium 134, potassium 5.0, chloride 101, bicarb 24, glucose 115, BUN 73, creatinine 3.23, BNP 2,193, high sensitive troponin 35-33, white count 6.8, hemoglobin 9.3, hematocrit 32.0, platelets 73. SARS COVID-19 negative   Urinalysis specific gravity 1.010.   Chest radiograph with small bilateral pleural effusions, increased interstitial vascular infiltrates, hilar vascular congestion, positive cardiomegaly.   EKG 60 bpm, right axis deviation, normal QTC, first-degree AV block with sinus arrhythmia, no ST segment changes, negative T wave lead II, lead III, aVF, V4-V6   Patient placed on high doses of IV furosemide with good toleration. Slowly improving volume status.   Oral antibiotic therapy for right leg cellulitis with good response.    Possible dc home in am with oral diuretic therapy.   Assessment & Plan:   Principal Problem:   Acute CHF (congestive heart failure) (HCC) Active  Problems:   Hypertension   Ischemic cardiomyopathy   Dyslipidemia   Diabetes mellitus, type II, insulin dependent (HCC)   OSA (obstructive sleep apnea)   PAD (peripheral artery disease) (HCC)   Cellulitis   CKD (chronic kidney disease) stage 4, GFR 15-29 ml/min (HCC)    Acute on chronic systolic heart failure decompensation. Biventricular failure, acute on chronic core pulmonale with class 2 pulmonary hypertension. Urine output 1,900 ml over last 24 hrs, edema and dyspnea continue to improve, no chest pain.    Echocardiogram with EF 25 to 30% with global hypokinesis, moderate dilatation of LV cavity. RV systolic function with severe reduction. RV cavity severely enlarged. Elevated pulmonary systolic pressure.    Continue aggressive diuresis with furosemide 80 mg Iv q8 hrs. Holding AV blocker due to bradycardia and after load reduction due to risk of hypotension.   Systolic blood pressure 811 to 127 mmHg.    2. Bradycardia, 1st degree AV block with sinus arrhythmia. Follow up EKG personally reviewed with HR 47 bpm, normal axis, qtc 460, right bundle branch block, first degree AV block no significant ST segment or T wave changes.   Stable HR high 50 and low 60, continue to hold on AV blocker, including carvedilol.    3. Right leg cellulitis/ PAD. No sepsis. At home on doxycycline and amoxicillin.   Clinically improving with cephalexin, continue local wound care and topical bacitracin.    4. T2DM/ dyslipidemia. Continue glucose control and monitoring with insulin sliding scale. Holding SGLT2 inh due to low GFR.  Continue with atorvastatin.   5. CKD stage 4 (base cr 3,0).  Improved urine output with IV furosemide 80 mg IV q8  hrs, Serum cr is 2,68 with K at 4,2 and bicarbonate at 28  Continue with diuresis    6. Iron deficiency anemia. On iron supplementation.    7. Gout. Continue with allopurinol, cholchicine and uloric. No acute flare.     Status is: Inpatient  Remains  inpatient appropriate because:Inpatient level of care appropriate due to severity of illness  Dispo: The patient is from: Home              Anticipated d/c is to: Home              Patient currently is not medically stable to d/c. Possible dc home tomorrow if continue to improve.    Difficult to place patient No   DVT prophylaxis: Heparin   Code Status:   full  Family Communication:  No family at the bedside     Antimicrobials:  Cephalexin     Subjective: Patient with improvement in his symptoms, edema, dyspnea and rash, but not yet back to baseline, no nausea or vomiting.   Objective: Vitals:   04/12/21 2048 04/13/21 0044 04/13/21 0406 04/13/21 1115  BP: 119/63  (!) 101/40 127/61  Pulse: (!) 58 61 (!) 59 66  Resp: 18 18 18 18   Temp: 98.3 F (36.8 C)  98.3 F (36.8 C) 97.9 F (36.6 C)  TempSrc: Oral  Oral Oral  SpO2: 94% 95% 97% 100%  Weight:   95.2 kg   Height:        Intake/Output Summary (Last 24 hours) at 04/13/2021 1208 Last data filed at 04/13/2021 1110 Gross per 24 hour  Intake 560 ml  Output 2100 ml  Net -1540 ml   Filed Weights   04/11/21 0346 04/12/21 0445 04/13/21 0406  Weight: 97.2 kg 95.7 kg 95.2 kg    Examination:   General: Not in pain or dyspnea, deconditioned  Neurology: Awake and alert, non focal  E ENT: mild pallor, no icterus, oral mucosa moist Cardiovascular: No JVD. S1-S2 present, rhythmic, no gallops, rubs, or murmurs. ++ pitting bilateral lower extremity edema, more right than left. . Pulmonary: positive breath sounds bilaterally, adequate air movement, no wheezing, rhonchi or rales. Gastrointestinal. Abdomen distended, abdominal ventral hernia. Skin. Right leg rash with dressing in place.  Musculoskeletal: no joint deformities     Data Reviewed: I have personally reviewed following labs and imaging studies  CBC: Recent Labs  Lab 04/09/21 1208 04/11/21 0321  WBC 6.8 5.6  NEUTROABS 4.7 2.9  HGB 9.3* 8.4*  HCT 32.0* 28.6*  MCV  100.0 97.9  PLT 73* 64*   Basic Metabolic Panel: Recent Labs  Lab 04/09/21 1208 04/10/21 0301 04/11/21 0321 04/12/21 0401 04/13/21 0240  NA 134* 134* 134* 136 135  K 5.0 4.5 4.8 4.3 4.2  CL 101 103 102 100 98  CO2 24 25 26 29 28   GLUCOSE 115* 159* 138* 130* 167*  BUN 73* 73* 73* 69* 68*  CREATININE 3.23* 3.03* 3.00* 2.73* 2.68*  CALCIUM 8.6* 8.4* 8.3* 8.5* 8.6*  MG  --   --   --  1.9  --    GFR: Estimated Creatinine Clearance: 23.7 mL/min (A) (by C-G formula based on SCr of 2.68 mg/dL (H)). Liver Function Tests: Recent Labs  Lab 04/09/21 1208  AST 30  ALT 11  ALKPHOS 89  BILITOT 1.2  PROT 6.8  ALBUMIN 3.0*   No results for input(s): LIPASE, AMYLASE in the last 168 hours. No results for input(s): AMMONIA in the last 168 hours. Coagulation  Profile: No results for input(s): INR, PROTIME in the last 168 hours. Cardiac Enzymes: No results for input(s): CKTOTAL, CKMB, CKMBINDEX, TROPONINI in the last 168 hours. BNP (last 3 results) No results for input(s): PROBNP in the last 8760 hours. HbA1C: No results for input(s): HGBA1C in the last 72 hours. CBG: Recent Labs  Lab 04/11/21 2125 04/12/21 0547 04/12/21 1136 04/13/21 0001 04/13/21 0519  GLUCAP 144* 132* 144* 200* 146*   Lipid Profile: No results for input(s): CHOL, HDL, LDLCALC, TRIG, CHOLHDL, LDLDIRECT in the last 72 hours. Thyroid Function Tests: No results for input(s): TSH, T4TOTAL, FREET4, T3FREE, THYROIDAB in the last 72 hours. Anemia Panel: No results for input(s): VITAMINB12, FOLATE, FERRITIN, TIBC, IRON, RETICCTPCT in the last 72 hours.    Radiology Studies: I have reviewed all of the imaging during this hospital visit personally     Scheduled Meds:  allopurinol  100 mg Oral Daily   aspirin EC  81 mg Oral QHS   atorvastatin  20 mg Oral Daily   bacitracin   Topical BID   cephALEXin  250 mg Oral Q8H   cholecalciferol  2,000 Units Oral Q M,W,F   febuxostat  40 mg Oral q morning   ferrous  sulfate  325 mg Oral Q M,W,F   furosemide  80 mg Intravenous TID   latanoprost  1 drop Both Eyes QHS   multivitamin with minerals  1 tablet Oral Daily   pantoprazole  40 mg Oral BID   saccharomyces boulardii  250 mg Oral BID   sodium chloride flush  3 mL Intravenous Q12H   tamsulosin  0.4 mg Oral QHS   Continuous Infusions:  sodium chloride       LOS: 4 days        Kailei Cowens Gerome Apley, MD

## 2021-04-13 NOTE — Plan of Care (Signed)
  Problem: Education: Goal: Ability to demonstrate management of disease process will improve Outcome: Progressing   Problem: Education: Goal: Ability to verbalize understanding of medication therapies will improve Outcome: Progressing   

## 2021-04-13 NOTE — Evaluation (Signed)
Physical Therapy Evaluation Patient Details Name: Scott [name] MRN: [id number] DOB: June 11, 1933 Today's Date: 04/13/2021   History of Present Illness  87 y.o. male presents to [facility] ED on 04/09/2021 for shortness of breath and LE edema.  Pt admitted for management of acute on chronic systolic heart failure. PMH: chronic thrombocytopenia, diabetes mellitus type 2, essential hypertension chronic kidney disease stage III, chronic combined systolic and diastolic heart failure iron deficiency anemia.  Clinical Impression  Pt presents to PT with deficits in balance, gait, power, and activity tolerance. Pt typically ambulates with use of a cane at baseline prior to recent hospital admission and SNF stay. Pt reports generalized weakness, feeling unsafe to attempt ambulation with cane at this time. Pt mobilizes well with support of RW and is able to tolerate household distances at this time without assistance. Pt will benefit from continued acute PT POC and HHPT at the time of discharge to improve strength and activity tolerance in an effort to restore the patient's PLOF.    Follow Up Recommendations Home health PT    Equipment Recommendations  None recommended by PT    Recommendations for Other Services       Precautions / Restrictions Precautions Precautions: Fall Restrictions Weight Bearing Restrictions: No      Mobility  Bed Mobility Overal bed mobility: Modified Independent             General bed mobility comments: pt received and left sitting in recliner    Transfers Overall transfer level: Needs assistance Equipment used: None Transfers: Sit to/from Stand Sit to Stand: Independent         General transfer comment: supervision for safety, improved stability with DME use  Ambulation/Gait Ambulation/Gait assistance: Modified independent (Device/Increase time) Gait Distance (Feet): 200 Feet Assistive device: Rolling walker (2 wheeled) Gait Pattern/deviations: Step-through  pattern Gait velocity: reduced Gait velocity interpretation: 1.31 - 2.62 ft/sec, indicative of limited community ambulator General Gait Details: pt with slowed step-through gait  Stairs            Wheelchair Mobility    Modified Rankin (Stroke Patients Only)       Balance Overall balance assessment: Needs assistance Sitting-balance support: No upper extremity supported;Feet supported Sitting balance-Leahy Scale: Good     Standing balance support: No upper extremity supported Standing balance-Leahy Scale: Fair Standing balance comment: fair static standing at sink, need for UE support during mobility                             Pertinent Vitals/Pain Pain Assessment: Faces Faces Pain Scale: Hurts a little bit Pain Location: RLE Pain Descriptors / Indicators: Grimacing Pain Intervention(s): Monitored during session    Home Living Family/patient expects to be discharged to:: Private residence Living Arrangements: Alone Available Help at Discharge: Family;Available PRN/intermittently Type of Home: House Home Access: Stairs to enter Entrance Stairs-Rails: Right Entrance Stairs-Number of Steps: 1 Home Layout: One level Home Equipment: Cane - single point;Walker - 4 wheels;Shower seat      Prior Function Level of Independence: Needs assistance   Gait / Transfers Assistance Needed: pt has been ambulating with use of rollator since discharge from SNF  ADL's / Homemaking Assistance Needed: modI with ADLs  Comments: denies any recent falls. Pt reports recently DC home from [facility] rehab, was not home long enough for [facility] services to start     Hand Dominance   Dominant Hand: Right    Extremity/Trunk Assessment  Upper Extremity Assessment Upper Extremity Assessment: Overall WFL for tasks assessed    Lower Extremity Assessment Lower Extremity Assessment: Generalized weakness    Cervical / Trunk Assessment Cervical / Trunk Assessment: Kyphotic   Communication   Communication: No difficulties  Cognition Arousal/Alertness: Awake/alert Behavior During Therapy: WFL for tasks assessed/performed Overall Cognitive Status: Within Functional Limits for tasks assessed                                        General Comments General comments (skin integrity, edema, etc.): VSS on RA    Exercises     Assessment/Plan    PT Assessment Patient needs continued PT services  PT Problem List Decreased balance;Decreased activity tolerance;Decreased mobility       PT Treatment Interventions DME instruction;Gait training;Stair training;Balance training;Therapeutic activities;Functional mobility training;Therapeutic exercise;Patient/family education    PT Goals (Current goals can be found in the Care Plan section)  Acute Rehab PT Goals Patient Stated Goal: to progress to ambulating with cane again PT Goal Formulation: With patient Time For Goal Achievement: 04/27/21 Potential to Achieve Goals: Good Additional Goals Additional Goal #1: Pt will ambulate for >300' reporting a DOB of 3/10 or less to indicate improved tolerance for community ambulation    Frequency Min 3X/week   Barriers to discharge        Co-evaluation               AM-PAC PT "6 Clicks" Mobility  Outcome Measure Help needed turning from your back to your side while in a flat bed without using bedrails?: None Help needed moving from lying on your back to sitting on the side of a flat bed without using bedrails?: None Help needed moving to and from a bed to a chair (including a wheelchair)?: None Help needed standing up from a chair using your arms (e.g., wheelchair or bedside chair)?: None Help needed to walk in hospital room?: A Little Help needed climbing 3-5 steps with a railing? : A Little 6 Click Score: 22    End of Session   Activity Tolerance: Patient tolerated treatment well Patient left: in chair;with call bell/phone within reach;with  family/visitor present Nurse Communication: Mobility status PT Visit Diagnosis: Other abnormalities of gait and mobility (R26.89)    Time: 9449-6759 PT Time Calculation (min) (ACUTE ONLY): 14 min   Charges:   PT Evaluation $PT Eval Low Complexity: 1 Low          Zenaida Niece, PT, DPT Acute Rehabilitation Pager: [phone]   Zenaida Niece 04/13/2021, 10:25 AM

## 2021-04-13 NOTE — Progress Notes (Signed)
Pt placed himself on CPAP for the night w/out difficulty. RT will continue to monitor.

## 2021-04-14 LAB — BASIC METABOLIC PANEL
Anion gap: 10 (ref 5–15)
BUN: 66 mg/dL — ABNORMAL HIGH (ref 8–23)
CO2: 26 mmol/L (ref 22–32)
Calcium: 8.6 mg/dL — ABNORMAL LOW (ref 8.9–10.3)
Chloride: 101 mmol/L (ref 98–111)
Creatinine, Ser: 2.58 mg/dL — ABNORMAL HIGH (ref 0.61–1.24)
GFR, Estimated: 24 mL/min — ABNORMAL LOW (ref >60)
Glucose, Bld: 143 mg/dL — ABNORMAL HIGH (ref 70–99)
Potassium: 4.6 mmol/L (ref 3.5–5.1)
Sodium: 137 mmol/L (ref 135–145)

## 2021-04-14 LAB — GLUCOSE, CAPILLARY: Glucose-Capillary: 125 mg/dL — ABNORMAL HIGH (ref 70–99)

## 2021-04-14 MED ORDER — CEPHALEXIN 250 MG PO CAPS: 250.0000 mg | ORAL_CAPSULE | Freq: Three times a day (TID) | ORAL | 0 refills | Status: AC

## 2021-04-14 MED ORDER — BACITRACIN ZINC 500 UNIT/GM EX OINT: TOPICAL_OINTMENT | Freq: Two times a day (BID) | CUTANEOUS | 0 refills | Status: AC

## 2021-04-14 MED ORDER — TORSEMIDE 20 MG PO TABS: 40.0000 mg | ORAL_TABLET | Freq: Every day | ORAL | Status: DC

## 2021-04-14 MED ORDER — TORSEMIDE 20 MG PO TABS: 40.0000 mg | ORAL_TABLET | Freq: Every day | ORAL | 0 refills | Status: DC

## 2021-04-14 NOTE — Progress Notes (Signed)
Patient says he checks his blood sugar at home and takes insulin. His orders for CBG/insulin were canceled and patient is wondering why that happened.

## 2021-04-14 NOTE — Progress Notes (Signed)
Pharmacist Heart Failure Core Measure Documentation  Assessment: Scott [name] has an EF documented as 25-30% on 04/10/21 by ECHO.  Rationale: Heart failure patients with left ventricular systolic dysfunction (LVSD) and an EF < 40% should be prescribed an angiotensin converting enzyme inhibitor (ACEI) or angiotensin receptor blocker (ARB) at discharge unless a contraindication is documented in the medical record.  This patient is not currently on an ACEI or ARB for HF.  This note is being placed in the record in order to provide documentation that a contraindication to the use of these agents is present for this encounter.  ACE Inhibitor or Angiotensin Receptor Blocker is contraindicated (specify all that apply)  []   ACEI allergy AND ARB allergy []   Angioedema []   Moderate or severe aortic stenosis []   Hyperkalemia []   Hypotension []   Renal artery stenosis [x]   Worsening renal function, preexisting renal disease or dysfunction  [name] D. Mina Marble, PharmD, BCPS, [place] 04/14/2021, 1:18 PM

## 2021-04-14 NOTE — Discharge Summary (Signed)
Physician Discharge Summary  Scott [name] MBW:[id number] DOB: 31-Oct-1932 DOA: 04/09/2021  PCP: Reubin Milan, MD  Admit date: 04/09/2021 Discharge date: 04/14/2021  Admitted From: Home  Disposition: Home   Recommendations for Outpatient Follow-up and new medication changes:  Follow up with Dr. Marjo Bicker in 7 to 10 days.  Continue cephalexin for 3 more days. Changed furosemide to torsemide  Continue insulin therapy, hold for hypoglycemia less than 120 mg/dl   Home Health: yes   Equipment/Devices: no    Discharge Condition: stable CODE STATUS: DNR   Diet recommendation:  heart healthy and diabetic prudent.   Brief/Interim Summary: Scott [name] was admitted to the hospital with the working diagnosis of acute decompensation of chronic systolic heart failure, complicated with right leg cellulits.    87 year old male past medical history for systolic heart failure, coronary artery disease, hypertension, chronic kidney disease, dyslipidemia, GERD, chronic iron deficiency anemia and history of GI bleed who presented with dyspnea and lower extremity edema.  He reported progressive symptoms for the last 2 to 3 weeks, gained about 7 to 8 pounds in the last 4 weeks.  His symptoms were refractive to an increased dose of furosemide at home.  On his initial physical examination blood pressure 139/72, heart rate 52, respiratory rate 15, oxygen saturation 100% on supplemental oxygen, his lungs had rales bilaterally at bases, heart S1-S2, present, rhythmic, abdomen soft and nontender, positive pitting bilateral extremity edema 2+. Right anterior leg erythema.    Sodium 134, potassium 5.0, chloride 101, bicarb 24, glucose 115, BUN 73, creatinine 3.23, BNP 2,193, high sensitive troponin 35-33, white count 6.8, hemoglobin 9.3, hematocrit 32.0, platelets 73. SARS COVID-19 negative   Urinalysis specific gravity 1.010.   Chest radiograph with small bilateral pleural effusions, increased interstitial vascular  infiltrates, hilar vascular congestion, positive cardiomegaly.   EKG 60 bpm, right axis deviation, normal QTC, first-degree AV block with sinus arrhythmia, no ST segment changes, negative T wave lead II, lead III, aVF, V4-V6   Patient placed on high doses of IV furosemide with good toleration. Slowly improving volume status.   Oral antibiotic therapy for right leg cellulitis with good response.    Transitioned to po torsemide, follow as outpatient.  Acute on chronic systolic heart failure decompensation, biventricular failure, acute on chronic cor pulmonale, class II pulmonary hypertension. Patient was admitted to the [place] ward, he was placed on a telemetry monitor. Received intravenous furosemide, 80 mg every 8 hours, and negative fluid balance was achieved with significant improvement of his symptoms.  Further work-up with echocardiography revealed left ventricle ejection fraction 25 to 30% with global hypokinesis, moderate dilatation of the left ventricle cavity.  Right ventricle systolic function with severe reduction.  RV cavity severely enlarged.  Elevated pulmonary systolic pressure.  Because of bradycardia AV blockade was held.  Antihypertensive agents also were held due to risk of hypotension, limiting heart failure guideline directed therapy.  Now transitioned to torsemide, will need close follow-up as an outpatient.  2.  Bradycardia.  First-degree AV block, sinus arrhythmia, right bundle branch block.  His heart rate remained stable between 50 and 60 bpm, carvedilol has been discontinued.  3.  Right leg cellulitis, peripheral artery disease patient was placed on cephalexin with improvement of his rash. Topical bacitracin was added with good toleration. Patient will need 3 more days of antibiotic therapy.  4.  Type 2 diabetes mellitus, dyslipidemia.  His glucose remained stable, unable to start SGLT2 inhibitors due to low GFR. At home continue insulin 70/30,  he reduces  the dose for low glucose and helps for hypoglycemia.  5.  Chronic kidney disease stage IV.  Based creatinine 3.0.  Patient tolerated well aggressive diuresis with IV loop diuretics. At his discharge sodium 137, potassium 4.6, chloride 101, bicarb 26, glucose 143, BUN 66, creatinine 2.58.  Patient will need follow-up kidney function as an outpatient.  6. Gout. No acute flare, continue with colchicine and febuxostat.    Discharge Diagnoses:  Principal Problem:   Acute CHF (congestive heart failure) (HCC) Active Problems:   Hypertension   Ischemic cardiomyopathy   Dyslipidemia   Diabetes mellitus, type II, insulin dependent (HCC)   OSA (obstructive sleep apnea)   PAD (peripheral artery disease) (HCC)   Cellulitis   CKD (chronic kidney disease) stage 4, GFR 15-29 ml/min [facility])    Discharge Instructions  Discharge Instructions     AMB Referral to [facility]   Complete by: As directed    [employer] Medicare:  PCP [facility] Referral:  High risk unplanned readmission less than 30 days, [employer]  Please assign to [facility] for complex care and disease management follow up calls and assess for further needs.  Questions please call:   Natividad Brood, RN BSN [facility] Liaison  [phone] business mobile phone Toll free office [phone]  Fax number: [phone] [email or web address]   Reason for Referral: [facility] Disease [facility] ([employer] payers)   Disease managment services needed: Nurse Case Manager   Diagnoses of: Heart Failure   Expected date of contact: Emergent - 3 Days      Allergies as of 04/14/2021   No Known Allergies      Medication List     STOP taking these medications    allopurinol 100 MG tablet Commonly known as: ZYLOPRIM   amoxicillin-clavulanate 250-125 MG tablet Commonly known as: AUGMENTIN   bisacodyl 10 MG suppository Commonly known as:  DULCOLAX   bismuth subsalicylate 630 ZS/01UX suspension Commonly known as: PEPTO BISMOL   carvedilol 6.25 MG tablet Commonly known as: COREG   doxycycline 100 MG capsule Commonly known as: MONODOX   furosemide 40 MG tablet Commonly known as: LASIX   magnesium hydroxide 400 MG/5ML suspension Commonly known as: MILK OF MAGNESIA   MIRALAX PO   pantoprazole 40 MG tablet Commonly known as: PROTONIX   saccharomyces boulardii 250 MG capsule Commonly known as: FLORASTOR       TAKE these medications    ALPRAZolam 0.25 MG tablet Commonly known as: XANAX Take 0.25 mg by mouth at bedtime.   aspirin 81 MG EC tablet Take 81 mg by mouth at bedtime.   atorvastatin 20 MG tablet Commonly known as: LIPITOR Take 1 tablet (20 mg total) by mouth daily.   bacitracin ointment Apply topically 2 (two) times daily for 7 days. Left leg open wound   cephALEXin 250 MG capsule Commonly known as: KEFLEX Take 1 capsule (250 mg total) by mouth every 8 (eight) hours for 3 days.   colchicine 0.6 MG tablet Take 0.6-1.2 mg by mouth See admin instructions. Take 2 tablets (1.2 mg) by mouth at onset of gout attack, may take 1 tablet (0.6 mg) one hour later if still needed. Max 3 tablets in 3 days   Febuxostat 80 MG Tabs Take 40 mg by mouth daily. What changed: Another medication with the same name was removed. Continue taking this medication, and follow the directions you see here.   ferrous sulfate 325 (65 FE) MG tablet Take 325  mg by mouth every Monday, Wednesday, and Friday.   fluticasone 50 MCG/ACT nasal spray Commonly known as: FLONASE Place into both nostrils daily.   insulin aspart protamine- aspart (70-30) 100 UNIT/ML injection Commonly known as: NOVOLOG MIX 70/30 Inject 30 Units into the skin 2 (two) times daily with a meal.   latanoprost 0.005 % ophthalmic solution Commonly known as: XALATAN Place 1 drop into both eyes at bedtime.   multivitamin with minerals Tabs tablet Take  1 tablet by mouth daily.   nitroGLYCERIN 0.4 MG SL tablet Commonly known as: NITROSTAT Place 1 tablet (0.4 mg total) under the tongue every 5 (five) minutes as needed for chest pain (up to 3 doses).   oxyCODONE-acetaminophen 5-325 MG tablet Commonly known as: PERCOCET/ROXICET Take 1 tablet by mouth every 6 (six) hours as needed for severe pain.   PRESCRIPTION MEDICATION Inhale into the lungs at bedtime. CPAP   tamsulosin 0.4 MG Caps capsule Commonly known as: FLOMAX Take 0.4 mg by mouth at bedtime.   torsemide 20 MG tablet Commonly known as: Demadex Take 2 tablets (40 mg total) by mouth daily.   Vitamin D 50 MCG (2000 UT) tablet Take 2,000 Units by mouth daily.        No Known Allergies    Procedures/Studies: DG Chest 2 View  Result Date: 04/09/2021 CLINICAL DATA:  Shortness of breath. EXAM: CHEST - 2 VIEW COMPARISON:  10/15/2017. FINDINGS: Prior CABG. Cardiomegaly and pulmonary venous congestion. Mild bibasilar pulmonary infiltrates and or edema. Underlying chronic interstitial changes. Small bilateral pleural effusions. No pneumothorax. IMPRESSION: 1. Prior CABG. Cardiomegaly with pulmonary venous congestion. Mild bibasilar pulmonary infiltrates/edema. Small bilateral pleural effusions. 2.  Underlying chronic interstitial disease. Electronically Signed   By: Marcello Moores  Register   On: 04/09/2021 13:09   ECHOCARDIOGRAM COMPLETE  Result Date: 04/10/2021    ECHOCARDIOGRAM REPORT   Patient Name:   Scott [name] Date of Exam: 04/10/2021 Medical Rec #:  [id number]    Height:       72.0 in Accession #:    [id number]   Weight:       213.9 lb Date of Birth:  03/28/1933    BSA:          2.192 m Patient Age:    87 years     BP:           115/94 mmHg Patient Gender: M            HR:           54 bpm. Exam Location:  Inpatient Procedure: 2D Echo Indications:    acute systolic chf  History:        Patient has prior history of Echocardiogram examinations, most                 recent 02/04/2020.  Cardiomyopathy, Prior CABG,                 Signs/Symptoms:Shortness of Breath; Risk Factors:Hypertension,                 Dyslipidemia, Diabetes and Sleep Apnea.  Sonographer:    Johny Chess Referring Phys: [id number] [place]  1. Left ventricular ejection fraction, by estimation, is 25 to 30%. The left ventricle has severely decreased function. The left ventricle demonstrates global hypokinesis. The left ventricular internal cavity size was moderately dilated. Left ventricular diastolic parameters are consistent with Grade II diastolic dysfunction (pseudonormalization). Elevated left ventricular end-diastolic pressure.  2. Right ventricular systolic function  is severely reduced. The right ventricular size is severely enlarged. There is severely elevated pulmonary artery systolic pressure.  3. Left atrial size was severely dilated.  4. Right atrial size was moderately dilated.  5. The mitral valve is degenerative. Mild mitral valve regurgitation.  6. Tricuspid valve regurgitation is mild to moderate.  7. The aortic valve is tricuspid. There is mild calcification of the aortic valve. There is mild thickening of the aortic valve. Aortic valve regurgitation is mild. FINDINGS  Left Ventricle: Left ventricular ejection fraction, by estimation, is 25 to 30%. The left ventricle has severely decreased function. The left ventricle demonstrates global hypokinesis. The left ventricular internal cavity size was moderately dilated. There is borderline left ventricular hypertrophy. Left ventricular diastolic parameters are consistent with Grade II diastolic dysfunction (pseudonormalization). Elevated left ventricular end-diastolic pressure. Right Ventricle: The right ventricular size is severely enlarged. Right vetricular wall thickness was not well visualized. Right ventricular systolic function is severely reduced. There is severely elevated pulmonary artery systolic pressure. The tricuspid regurgitant  velocity is 3.54 m/s, and with an assumed right atrial pressure of 10 mmHg, the estimated right ventricular systolic pressure is 45.6 mmHg. Left Atrium: Left atrial size was severely dilated. Right Atrium: Right atrial size was moderately dilated. Pericardium: There is no evidence of pericardial effusion. Mitral Valve: The mitral valve is degenerative in appearance. There is moderate thickening of the mitral valve leaflet(s). There is moderate calcification of the mitral valve leaflet(s). Mild mitral valve regurgitation. Tricuspid Valve: The tricuspid valve is grossly normal. Tricuspid valve regurgitation is mild to moderate. Aortic Valve: The aortic valve is tricuspid. There is mild calcification of the aortic valve. There is mild thickening of the aortic valve. Aortic valve regurgitation is mild. Aortic regurgitation PHT measures 524 msec. Pulmonic Valve: The pulmonic valve was grossly normal. Pulmonic valve regurgitation is mild to moderate. Aorta: The aortic root and ascending aorta are structurally normal, with no evidence of dilitation. IAS/Shunts: The atrial septum is grossly normal.  LEFT VENTRICLE PLAX 2D LVIDd:         5.90 cm      Diastology LVIDs:         4.80 cm      LV e' medial:    3.81 cm/s LV PW:         1.10 cm      LV E/e' medial:  34.4 LV IVS:        1.10 cm      LV e' lateral:   6.53 cm/s LVOT diam:     2.30 cm      LV E/e' lateral: 20.1 LV SV:         74 LV SV Index:   34 LVOT Area:     4.15 cm  LV Volumes (MOD) LV vol d, MOD A2C: 182.0 ml LV vol d, MOD A4C: 161.0 ml LV vol s, MOD A2C: 123.0 ml LV vol s, MOD A4C: 98.0 ml LV SV MOD A2C:     59.0 ml LV SV MOD A4C:     161.0 ml LV SV MOD BP:      65.1 ml RIGHT VENTRICLE            IVC RV S prime:     4.43 cm/s  IVC diam: 2.70 cm TAPSE (M-mode): 0.9 cm LEFT ATRIUM              Index       RIGHT ATRIUM  Index LA diam:        5.60 cm  2.55 cm/m  RA Area:     24.40 cm LA Vol (A2C):   106.0 ml 48.35 ml/m RA Volume:   71.00 ml  32.39 ml/m  LA Vol (A4C):   99.5 ml  45.39 ml/m LA Biplane Vol: 108.0 ml 49.26 ml/m  AORTIC VALVE LVOT Vmax:   78.50 cm/s LVOT Vmean:  48.700 cm/s LVOT VTI:    0.178 m AI PHT:      524 msec  AORTA Ao Root diam: 3.50 cm Ao Asc diam:  3.80 cm MITRAL VALVE                 TRICUSPID VALVE MV Area (PHT): 4.10 cm      TR Peak grad:   50.1 mmHg MV Decel Time: 185 msec      TR Vmax:        354.00 cm/s MR Peak grad:    74.3 mmHg MR Mean grad:    50.0 mmHg   SHUNTS MR Vmax:         431.00 cm/s Systemic VTI:  0.18 m MR Vmean:        337.0 cm/s  Systemic Diam: 2.30 cm MR PISA:         1.01 cm MR PISA Eff ROA: 9 mm MR PISA Radius:  0.40 cm MV E velocity: 131.00 cm/s MV A velocity: 79.80 cm/s MV E/A ratio:  1.64 Mertie Moores MD Electronically signed by Mertie Moores MD Signature Date/Time: 04/10/2021/3:26:32 PM    Final      Subjective: Patient is feeling better, he has been ambulating in the hallway, no nausea or vomiting. Edema continue to improve.   Discharge Exam: Vitals:   04/14/21 1014 04/14/21 1140  BP: 114/64 117/73  Pulse: 61 62  Resp: 20   Temp: 97.7 F (36.5 C)   SpO2: 100% 100%   Vitals:   04/14/21 0314 04/14/21 0802 04/14/21 1014 04/14/21 1140  BP: 109/67 132/65 114/64 117/73  Pulse: 61 70 61 62  Resp: (!) 22  20   Temp: 98 F (36.7 C)  97.7 F (36.5 C)   TempSrc: Oral  Oral   SpO2: 93% 100% 100% 100%  Weight:      Height:        General: Not in pain or dyspnea. Neurology: Awake and alert, non focal  E ENT: no pallor, no icterus, oral mucosa moist Cardiovascular: No JVD. S1-S2 present, rhythmic, no gallops, rubs, or murmurs. Trace pitting bilateral lower extremity edema. Pulmonary: positive breath sounds bilaterally, adequate air movement, no wheezing, rhonchi or rales. Gastrointestinal. Abdomen distended due to abdominal hernia, ventral. Skin. Right leg rash improving, no purulence or local tenderness.  Musculoskeletal: no joint deformities   The results of significant diagnostics  from this hospitalization (including imaging, microbiology, ancillary and laboratory) are listed below for reference.     Microbiology: Recent Results (from the past 240 hour(s))  Resp Panel by RT-PCR (Flu A&B, Covid) Nasopharyngeal Swab     Status: None   Collection Time: 04/09/21  4:13 PM   Specimen: Nasopharyngeal Swab; Nasopharyngeal(NP) swabs in vial transport medium  Result Value Ref Range Status   SARS Coronavirus 2 by RT PCR NEGATIVE NEGATIVE Final    Comment: (NOTE) SARS-CoV-2 target nucleic acids are NOT DETECTED.  The SARS-CoV-2 RNA is generally detectable in upper respiratory specimens during the acute phase of infection. The lowest concentration of SARS-CoV-2 viral copies this assay can detect  is 138 copies/mL. A negative result does not preclude SARS-Cov-2 infection and should not be used as the sole basis for treatment or other patient management decisions. A negative result may occur with  improper specimen collection/handling, submission of specimen other than nasopharyngeal swab, presence of viral mutation(s) within the areas targeted by this assay, and inadequate number of viral copies(<138 copies/mL). A negative result must be combined with clinical observations, patient history, and epidemiological information. The expected result is Negative.  Fact Sheet for Patients:  EntrepreneurPulse.com.au  Fact Sheet for Healthcare Providers:  IncredibleEmployment.be  This test is no t yet approved or cleared by the [place] FDA and  has been authorized for detection and/or diagnosis of SARS-CoV-2 by FDA under an Emergency Use Authorization (EUA). This EUA will remain  in effect (meaning this test can be used) for the duration of the COVID-19 declaration under Section 564(b)(1) of the Act, 21 U.S.C.section 360bbb-3(b)(1), unless the authorization is terminated  or revoked sooner.       Influenza A by PCR NEGATIVE NEGATIVE  Final   Influenza B by PCR NEGATIVE NEGATIVE Final    Comment: (NOTE) The Xpert Xpress SARS-CoV-2/FLU/RSV plus assay is intended as an aid in the diagnosis of influenza from Nasopharyngeal swab specimens and should not be used as a sole basis for treatment. Nasal washings and aspirates are unacceptable for Xpert Xpress SARS-CoV-2/FLU/RSV testing.  Fact Sheet for Patients: EntrepreneurPulse.com.au  Fact Sheet for Healthcare Providers: IncredibleEmployment.be  This test is not yet approved or cleared by the [place] FDA and has been authorized for detection and/or diagnosis of SARS-CoV-2 by FDA under an Emergency Use Authorization (EUA). This EUA will remain in effect (meaning this test can be used) for the duration of the COVID-19 declaration under Section 564(b)(1) of the Act, 21 U.S.C. section 360bbb-3(b)(1), unless the authorization is terminated or revoked.  Performed at [facility], [place] [address], [place], [place] [address]      Labs: BNP (last 3 results) Recent Labs    09/12/20 1226 04/09/21 1208  BNP 796.8* 1,017.5*   Basic Metabolic Panel: Recent Labs  Lab 04/10/21 0301 04/11/21 0321 04/12/21 0401 04/13/21 0240 04/14/21 0256  NA 134* 134* 136 135 137  K 4.5 4.8 4.3 4.2 4.6  CL 103 102 100 98 101  CO2 25 26 29 28 26   GLUCOSE 159* 138* 130* 167* 143*  BUN 73* 73* 69* 68* 66*  CREATININE 3.03* 3.00* 2.73* 2.68* 2.58*  CALCIUM 8.4* 8.3* 8.5* 8.6* 8.6*  MG  --   --  1.9  --   --    Liver Function Tests: Recent Labs  Lab 04/09/21 1208  AST 30  ALT 11  ALKPHOS 89  BILITOT 1.2  PROT 6.8  ALBUMIN 3.0*   No results for input(s): LIPASE, AMYLASE in the last 168 hours. No results for input(s): AMMONIA in the last 168 hours. CBC: Recent Labs  Lab 04/09/21 1208 04/11/21 0321  WBC 6.8 5.6  NEUTROABS 4.7 2.9  HGB 9.3* 8.4*  HCT 32.0* 28.6*  MCV 100.0 97.9  PLT 73* 64*   Cardiac Enzymes: No results  for input(s): CKTOTAL, CKMB, CKMBINDEX, TROPONINI in the last 168 hours. BNP: Invalid input(s): POCBNP CBG: Recent Labs  Lab 04/12/21 0547 04/12/21 1136 04/13/21 0001 04/13/21 0519 04/14/21 0517  GLUCAP 132* 144* 200* 146* 125*   D-Dimer No results for input(s): DDIMER in the last 72 hours. Hgb A1c No results for input(s): HGBA1C in the last 72 hours.  Lipid Profile No results for input(s): CHOL, HDL, LDLCALC, TRIG, CHOLHDL, LDLDIRECT in the last 72 hours. Thyroid function studies No results for input(s): TSH, T4TOTAL, T3FREE, THYROIDAB in the last 72 hours.  Invalid input(s): FREET3 Anemia work up No results for input(s): VITAMINB12, FOLATE, FERRITIN, TIBC, IRON, RETICCTPCT in the last 72 hours. Urinalysis    Component Value Date/Time   COLORURINE YELLOW 03/10/2021 1029   APPEARANCEUR CLEAR 03/10/2021 1029   LABSPEC 1.010 03/10/2021 1029   PHURINE 5.0 03/10/2021 1029   GLUCOSEU NEGATIVE 03/10/2021 1029   HGBUR NEGATIVE 03/10/2021 [place] 03/10/2021 1029   [place] 03/10/2021 1029   PROTEINUR NEGATIVE 03/10/2021 1029   UROBILINOGEN 1.0 08/31/2012 1735   NITRITE NEGATIVE 03/10/2021 1029   LEUKOCYTESUR NEGATIVE 03/10/2021 1029   Sepsis Labs Invalid input(s): PROCALCITONIN,  WBC,  LACTICIDVEN Microbiology Recent Results (from the past 240 hour(s))  Resp Panel by RT-PCR (Flu A&B, Covid) Nasopharyngeal Swab     Status: None   Collection Time: 04/09/21  4:13 PM   Specimen: Nasopharyngeal Swab; Nasopharyngeal(NP) swabs in vial transport medium  Result Value Ref Range Status   SARS Coronavirus 2 by RT PCR NEGATIVE NEGATIVE Final    Comment: (NOTE) SARS-CoV-2 target nucleic acids are NOT DETECTED.  The SARS-CoV-2 RNA is generally detectable in upper respiratory specimens during the acute phase of infection. The lowest concentration of SARS-CoV-2 viral copies this assay can detect is 138 copies/mL. A negative result does not preclude  SARS-Cov-2 infection and should not be used as the sole basis for treatment or other patient management decisions. A negative result may occur with  improper specimen collection/handling, submission of specimen other than nasopharyngeal swab, presence of viral mutation(s) within the areas targeted by this assay, and inadequate number of viral copies(<138 copies/mL). A negative result must be combined with clinical observations, patient history, and epidemiological information. The expected result is Negative.  Fact Sheet for Patients:  EntrepreneurPulse.com.au  Fact Sheet for Healthcare Providers:  IncredibleEmployment.be  This test is no t yet approved or cleared by the [place] FDA and  has been authorized for detection and/or diagnosis of SARS-CoV-2 by FDA under an Emergency Use Authorization (EUA). This EUA will remain  in effect (meaning this test can be used) for the duration of the COVID-19 declaration under Section 564(b)(1) of the Act, 21 U.S.C.section 360bbb-3(b)(1), unless the authorization is terminated  or revoked sooner.       Influenza A by PCR NEGATIVE NEGATIVE Final   Influenza B by PCR NEGATIVE NEGATIVE Final    Comment: (NOTE) The Xpert Xpress SARS-CoV-2/FLU/RSV plus assay is intended as an aid in the diagnosis of influenza from Nasopharyngeal swab specimens and should not be used as a sole basis for treatment. Nasal washings and aspirates are unacceptable for Xpert Xpress SARS-CoV-2/FLU/RSV testing.  Fact Sheet for Patients: EntrepreneurPulse.com.au  Fact Sheet for Healthcare Providers: IncredibleEmployment.be  This test is not yet approved or cleared by the [place] FDA and has been authorized for detection and/or diagnosis of SARS-CoV-2 by FDA under an Emergency Use Authorization (EUA). This EUA will remain in effect (meaning this test can be used) for the duration of  the COVID-19 declaration under Section 564(b)(1) of the Act, 21 U.S.C. section 360bbb-3(b)(1), unless the authorization is terminated or revoked.  Performed at [facility], [place] [address], [place], [place] [address]      Time coordinating discharge: 45 minutes  SIGNED:   Tawni Millers, MD  [facility]  04/14/2021, 11:49 AM

## 2021-04-14 NOTE — Progress Notes (Signed)
  Mobility Specialist Criteria Algorithm Info.  Mobility Team: [facility] elevated:Self regulated Activity: Ambulated in hall; Dangled on edge of bed Range of motion: Active; All extremities Level of assistance: Standby assist, set-up cues, supervision of patient - no hands on Assistive device: Front wheel walker Minutes sitting in chair:  Minutes stood: 12 minutes Minutes ambulated: 12 minutes Distance ambulated (ft): 460 ft Mobility response: Tolerated well Bed Position: Semi-fowlers  Patient eagerly agreed to participate in mobility. Ambulated in hallway 460 feet with very slow steady gait. HR sustaining in low 70's and oxygen saturating mid 90's throughout. Tolerated ambulation well without incident or complaint and was left dangling EOB with all needs met.   04/14/2021 11:24 AM

## 2021-04-14 NOTE — TOC Transition Note (Signed)
Transition of Care [facility]) - CM/SW Discharge Note   Patient Details  Name: Scott [name] MRN: [id number] Date of Birth: Mar 04, 1933  Transition of Care [facility]) CM/SW Contact:  Zenon Mayo, RN Phone Number: 04/14/2021, 12:10 PM   Clinical Narrative:    NCM offered choice, he states he does not have a preference, NCM made referral to [facility] with [place] for [place], [place], [place].  She is able to take referral.  Soc will begin 24 to 48 hrs post dc.  He states his brother will transport him home today.  He has no issues with getting medications or taking medications.     Final next level of care: [place] Barriers to Discharge: No Barriers Identified   Patient Goals and CMS Choice Patient states their goals for this hospitalization and ongoing recovery are:: return home CMS Medicare.gov Compare Post Acute Care list provided to:: Patient Choice offered to / list presented to : Patient  Discharge Placement                       Discharge Plan and Services   Discharge Planning Services: CM Consult              DME Agency: NA       HH Arranged: RN, PT, OT [facility] Agency: [place] Date [facility] Agency Contacted: 04/14/21 Time [place]: 1209 Representative spoke with at [place]: [place] ([place]) Interventions     Readmission Risk Interventions Readmission Risk Prevention Plan 04/14/2021 04/10/2021 01/09/2020  Transportation Screening Complete Complete Complete  PCP or Specialist Appt within 5-7 Days - - Complete  PCP or Specialist Appt within 3-5 Days Complete - -  Home Care Screening - - Complete  Medication Review (RN CM) - - Complete  HRI or Home Care Consult Complete - -  Social Work Consult for [place] Planning/Counseling Complete Complete -  Palliative Care Screening Not Applicable Not Applicable -  Medication Review Press photographer) Complete - -  Some recent data might be hidden

## 2021-04-15 ENCOUNTER — Other Ambulatory Visit: Payer: Self-pay

## 2021-04-15 DIAGNOSIS — L97819 Non-pressure chronic ulcer of other part of right lower leg with unspecified severity: Secondary | ICD-10-CM | POA: Diagnosis not present

## 2021-04-15 DIAGNOSIS — E1151 Type 2 diabetes mellitus with diabetic peripheral angiopathy without gangrene: Secondary | ICD-10-CM | POA: Diagnosis not present

## 2021-04-15 DIAGNOSIS — I872 Venous insufficiency (chronic) (peripheral): Secondary | ICD-10-CM | POA: Diagnosis not present

## 2021-04-15 DIAGNOSIS — I5042 Chronic combined systolic (congestive) and diastolic (congestive) heart failure: Secondary | ICD-10-CM | POA: Diagnosis not present

## 2021-04-15 DIAGNOSIS — E1122 Type 2 diabetes mellitus with diabetic chronic kidney disease: Secondary | ICD-10-CM | POA: Diagnosis not present

## 2021-04-15 DIAGNOSIS — N1832 Chronic kidney disease, stage 3b: Secondary | ICD-10-CM | POA: Diagnosis not present

## 2021-04-15 DIAGNOSIS — I130 Hypertensive heart and chronic kidney disease with heart failure and stage 1 through stage 4 chronic kidney disease, or unspecified chronic kidney disease: Secondary | ICD-10-CM | POA: Diagnosis not present

## 2021-04-15 DIAGNOSIS — D631 Anemia in chronic kidney disease: Secondary | ICD-10-CM | POA: Diagnosis not present

## 2021-04-15 DIAGNOSIS — L97829 Non-pressure chronic ulcer of other part of left lower leg with unspecified severity: Secondary | ICD-10-CM | POA: Diagnosis not present

## 2021-04-15 NOTE — Patient Outreach (Signed)
[facility]) Care Management  [place]  04/15/2021   Scott [name] 1933-01-27 [id number]  Subjective: Telephone call to patient for hospital follow up. Patient reports doing ok this am.  He reports better than before hospitalization. Patient reports that he has all his medication and manages himself.  He has support of brother who is active in care but does not live in the home. Patient independent with care per patient.  Home health has been to visit this am for evaluation. He reports [facility] not [facility] but does have cardiologist follow up scheduled. Discussed hospitalization and heart failure management. He verbalizes understanding and voices no concerns.  Discussed [facility] services and support. Patient agreeable to care management services at this time.    Objective:   Encounter Medications:  Outpatient Encounter Medications as of 04/15/2021  Medication Sig   ALPRAZolam (XANAX) 0.25 MG tablet Take 0.25 mg by mouth at bedtime.   aspirin 81 MG EC tablet Take 81 mg by mouth at bedtime.   atorvastatin (LIPITOR) 20 MG tablet Take 1 tablet (20 mg total) by mouth daily.   bacitracin ointment Apply topically 2 (two) times daily for 7 days. Left leg open wound   cephALEXin (KEFLEX) 250 MG capsule Take 1 capsule (250 mg total) by mouth every 8 (eight) hours for 3 days.   Cholecalciferol (VITAMIN D) 50 MCG (2000 UT) tablet Take 2,000 Units by mouth daily.   colchicine 0.6 MG tablet Take 0.6-1.2 mg by mouth See admin instructions. Take 2 tablets (1.2 mg) by mouth at onset of gout attack, may take 1 tablet (0.6 mg) one hour later if still needed. Max 3 tablets in 3 days   Febuxostat 80 MG TABS Take 40 mg by mouth daily.   ferrous sulfate 325 (65 FE) MG tablet Take 325 mg by mouth every Monday, Wednesday, and Friday.   fluticasone (FLONASE) 50 MCG/ACT nasal spray Place into both nostrils daily.   insulin aspart protamine- aspart (NOVOLOG MIX 70/30)  (70-30) 100 UNIT/ML injection Inject 30 Units into the skin 2 (two) times daily with a meal.   latanoprost (XALATAN) 0.005 % ophthalmic solution Place 1 drop into both eyes at bedtime.   Multiple Vitamin (MULTIVITAMIN WITH MINERALS) TABS tablet Take 1 tablet by mouth daily.   nitroGLYCERIN (NITROSTAT) 0.4 MG SL tablet Place 1 tablet (0.4 mg total) under the tongue every 5 (five) minutes as needed for chest pain (up to 3 doses).   oxyCODONE-acetaminophen (PERCOCET/ROXICET) 5-325 MG tablet Take 1 tablet by mouth every 6 (six) hours as needed for severe pain.   PRESCRIPTION MEDICATION Inhale into the lungs at bedtime. CPAP   tamsulosin (FLOMAX) 0.4 MG CAPS capsule Take 0.4 mg by mouth at bedtime.   torsemide (DEMADEX) 20 MG tablet Take 2 tablets (40 mg total) by mouth daily.   No facility-administered encounter medications on file as of 04/15/2021.    Functional Status:  In your present state of health, do you have any difficulty performing the following activities: 04/15/2021 04/09/2021  Hearing? N N  Vision? N N  Difficulty concentrating or making decisions? N N  Walking or climbing stairs? Y Y  Comment weakness and shortness of breath -  Dressing or bathing? N N  Doing errands, shopping? N N  Preparing Food and eating ? N -  Using the Toilet? N -  In the past six months, have you accidently leaked urine? N -  Do you have  problems with loss of bowel control? N -  Managing your Medications? N -  Managing your Finances? N -  Housekeeping or managing your Housekeeping? N -  Some recent data might be hidden    Fall/Depression Screening: Fall Risk  04/15/2021  Falls in the past year? 0   PHQ 2/9 Scores 04/15/2021 10/24/2017  PHQ - 2 Score 0 0    Assessment:   Care Plan Care Plan : General Plan of Care (Adult)  Updates made by Jon Billings, RN since 04/15/2021 12:00 AM     Problem: Health Promotion or Disease Self-Management (General Plan of Care)      Goal: [place] as evidenced by no re-hospitalization within 30 days.   Start Date: 04/15/2021  Expected End Date: 06/10/2021  Priority: High  Note:   Evidence-based guidance:  Review biopsychosocial determinants of health screens.  Determine level of modifiable health risk.  Assess level of patient activation, level of readiness, importance and confidence to make changes.  Evoke change talk using open-ended questions, pros and cons, as well as looking forward.  Identify areas where behavior change may lead to improved health.  Partner with patient to develop a robust self-management plan that includes lifestyle factors, such as weight loss, exercise and healthy nutrition, as well as goals specific to disease risks.  Support patient and family/caregiver active participation in decision-making and self-management plan.  Implement additional goals and interventions based on identified risk factors to reduce health risk.  Facilitate advance care planning.  Review need for preventive screening based on age, sex, family history and health history.   Notes:     Task: Mutually Develop and Royce [place]adamia Achievement of Patient Goals   Due Date: 05/10/2021  Priority: Routine  Responsible User: Jon Billings, RN  Note:   Care Management Activities:    - collaboration with team encouraged - questions answered - reassurance provided - resources needed to meet goals identified - verbalization of feelings encouraged    Notes: 04/15/21 Patient with recent hospitalization related to heart failure.  Patient reports feeling better. Patient lives alone but brother helps and lives near by. Patient manages medication and able to review medication listing.  Patient agreeable to [facility] telephonic outreach.  Patient has follow up with cardiology scheduled.  Patient has not had a chance to call PCP but will be this morning.     Care Plan : Heart Failure (Adult)  Updates made by Jon Billings, RN since 04/15/2021 12:00 AM      Problem: Symptom Exacerbation (Heart Failure)      Long-Range Goal: Symptom Exacerbation Prevented or Minimized as evidenced by no heart failure exacerbation   Start Date: 04/15/2021  Expected End Date: 10/10/2021  Priority: High  Note:   Evidence-based guidance:  Perform or review cognitive and/or health literacy screening.  Assess understanding of adherence and barriers to treatment plan, as well as lifestyle changes; develop strategies to address barriers.  Establish a mutually-agreed-upon early intervention process to communicate with primary care provider when signs/symptoms worsen.  Facilitate timely posthospital discharge or emergency department treatment that includes intensive follow-up via telephone calls, home visit, telehealth monitoring and care at multidisciplinary heart failure clinic.  Adjust frequency and intensity of follow-up based on presentation, number of emergency department visits, hospital admissions and frequency and severity of symptom exacerbation.  Facilitate timely visit, usually within 1 week, with primary care provider following hospital discharge.  Collaborate with clinical pharmacist to address adverse drug reactions, drug interactions, subtherapeutic dosage, patient  and family education.  Regularly screen for presence of depressive symptoms using a validated tool; consider pharmacologic therapy and/or referral for cognitive behavioral therapy when present.  Refer to community-based services, such as a heart failure support group, community Economist or peer support program.  Review immunization status; arrange receipt of needed vaccinations.  Prepare patient for home oxygen use based on signs/symptoms.   Notes:     Task: Identify and Minimize Risk of Heart Failure Exacerbation   Due Date: 10/10/2021  Priority: Routine  Responsible User: Jon Billings, RN  Note:   Care Management Activities:    - health literacy screening completed or reviewed -  healthy lifestyle promoted - rescue (action) plan reviewed - self-awareness of signs/symptoms of worsening disease encouraged     Notes: 04/15/21 Patient weight 209 lbs. Encouraged weights daily and notification of MD for any changes to avoid hospitalization. Heart Failure Management Discussed Please weight daily or as ordered by your doctor Report to your doctor weight gain of 2 -3 pounds in a day or 5 pounds in a week Limit salt intake Monitor for shortness of breath, swelling of feet, ankles or abdomen and weight gain. Never use the saltshaker.  Read all food labels and avoid canned, processed, and pickled foods.   Follow your doctor's recommendations for daily salt intake.      Goals Addressed             This Visit's Progress    Make and Keep All Appointments       Barriers: Knowledge Timeframe:  Short-Term Goal Priority:  High Start Date:    04/15/21                         Expected End Date:05/10/21                     Follow Up Date 04/23/21   - arrange a ride through an agency 1 week before appointment - call to cancel if needed    Why is this important?   Part of staying healthy is seeing the doctor for follow-up care.  If you forget your appointments, there are some things you can do to stay on track.    Notes: 04/15/21 Patient reports his brother takes him to appointments.  Patient has cardiology scheduled and will be calling PCP for hospital follow up.       Track and Manage Symptoms-Heart Failure       Barriers: Health Behaviors Knowledge Timeframe:  Long-Range Goal Priority:  High Start Date:    04/15/21                         Expected End Date:     10/10/21                  Follow Up Date 04/23/21   - develop a rescue plan - follow rescue plan if symptoms flare-up - know when to call the doctor    Why is this important?   You will be able to handle your symptoms better if you keep track of them.  Making some simple changes to your lifestyle will  help.  Eating healthy is one thing you can do to take good care of yourself.    Notes: 04/15/21 Patient weight 209 lbs. Encouraged weights daily and notification of MD for any changes to avoid hospitalization. Heart Failure Management Discussed Please weight daily or  as ordered by your doctor Report to your doctor weight gain of 2 -3 pounds in a day or 5 pounds in a week Limit salt intake Monitor for shortness of breath, swelling of feet, ankles or abdomen and weight gain. Never use the saltshaker.  Read all food labels and avoid canned, processed, and pickled foods.   Follow your doctor's recommendations for daily salt intake.          Plan: RN CM will provide ongoing education and support to patient through phone calls.   RN CM will send welcome packet with consent to patient.   RN CM will send initial barriers letter, assessment, and care plan to primary care physician.   Follow-up: Patient agrees to Care Plan and Follow-up. Follow-up in 1 week(s)  Jone Baseman, RN, MSN [place] Management Care Management Coordinator Direct Line [phone] Cell [phone] Toll Free: [phone]  Fax: [phone]

## 2021-04-15 NOTE — Patient Instructions (Signed)
Goals Addressed             This Visit's Progress    Make and Keep All Appointments       Barriers: Knowledge Timeframe:  Short-Term Goal Priority:  High Start Date:    04/15/21                         Expected End Date:05/10/21                     Follow Up Date 04/23/21   - arrange a ride through an agency 1 week before appointment - call to cancel if needed    Why is this important?   Part of staying healthy is seeing the doctor for follow-up care.  If you forget your appointments, there are some things you can do to stay on track.    Notes: 04/15/21 Patient reports his brother takes him to appointments.  Patient has cardiology scheduled and will be calling PCP for hospital follow up.       Track and Manage Symptoms-Heart Failure       Barriers: Health Behaviors Knowledge Timeframe:  Long-Range Goal Priority:  High Start Date:    04/15/21                         Expected End Date:     10/10/21                  Follow Up Date 04/23/21   - develop a rescue plan - follow rescue plan if symptoms flare-up - know when to call the doctor    Why is this important?   You will be able to handle your symptoms better if you keep track of them.  Making some simple changes to your lifestyle will help.  Eating healthy is one thing you can do to take good care of yourself.    Notes: 04/15/21 Patient weight 209 lbs. Encouraged weights daily and notification of MD for any changes to avoid hospitalization. Heart Failure Management Discussed Please weight daily or as ordered by your doctor Report to your doctor weight gain of 2 -3 pounds in a day or 5 pounds in a week Limit salt intake Monitor for shortness of breath, swelling of feet, ankles or abdomen and weight gain. Never use the saltshaker.  Read all food labels and avoid canned, processed, and pickled foods.   Follow your doctor's recommendations for daily salt intake.

## 2021-04-15 NOTE — Telephone Encounter (Signed)
Patient admitted to [facility] hospital 04/09/21.

## 2021-04-17 DIAGNOSIS — I872 Venous insufficiency (chronic) (peripheral): Secondary | ICD-10-CM | POA: Diagnosis not present

## 2021-04-17 DIAGNOSIS — D631 Anemia in chronic kidney disease: Secondary | ICD-10-CM | POA: Diagnosis not present

## 2021-04-17 DIAGNOSIS — E1122 Type 2 diabetes mellitus with diabetic chronic kidney disease: Secondary | ICD-10-CM | POA: Diagnosis not present

## 2021-04-17 DIAGNOSIS — L97819 Non-pressure chronic ulcer of other part of right lower leg with unspecified severity: Secondary | ICD-10-CM | POA: Diagnosis not present

## 2021-04-17 DIAGNOSIS — L97829 Non-pressure chronic ulcer of other part of left lower leg with unspecified severity: Secondary | ICD-10-CM | POA: Diagnosis not present

## 2021-04-17 DIAGNOSIS — I130 Hypertensive heart and chronic kidney disease with heart failure and stage 1 through stage 4 chronic kidney disease, or unspecified chronic kidney disease: Secondary | ICD-10-CM | POA: Diagnosis not present

## 2021-04-17 DIAGNOSIS — N1832 Chronic kidney disease, stage 3b: Secondary | ICD-10-CM | POA: Diagnosis not present

## 2021-04-17 DIAGNOSIS — E1151 Type 2 diabetes mellitus with diabetic peripheral angiopathy without gangrene: Secondary | ICD-10-CM | POA: Diagnosis not present

## 2021-04-17 DIAGNOSIS — I5042 Chronic combined systolic (congestive) and diastolic (congestive) heart failure: Secondary | ICD-10-CM | POA: Diagnosis not present

## 2021-04-17 NOTE — TOC Progression Note (Signed)
Transition of Care [facility]) - Progression Note    Patient Details  Name: Scott [name] MRN: [id number] Date of Birth: Jun 04, 1933  Transition of Care [facility]) CM/SW Contact  Zenon Mayo, RN Phone Number: 04/17/2021, 2:06 PM  Clinical Narrative:    NCM received call that [facility] with [facility] went to see patient, and he was set up with [facility].  Patient did not mention he was set up with anyone when he was in the hospital and offered [facility] services.      Expected Discharge Plan: [place] Barriers to Discharge: No Barriers Identified  Expected Discharge Plan and Services Expected Discharge Plan: [place]   Discharge Planning Services: CM Consult   Living arrangements for the past 2 months: Single Family Home Expected Discharge Date: 04/14/21                 DME Agency: NA       HH Arranged: RN, PT, OT HH Agency: [place] Date HH Agency Contacted: 04/14/21 Time [place]: 1209 Representative spoke with at [place]: [place] ([place]) Interventions    Readmission Risk Interventions Readmission Risk Prevention Plan 04/14/2021 04/10/2021 01/09/2020  Transportation Screening Complete Complete Complete  PCP or Specialist Appt within 5-7 Days - - Complete  PCP or Specialist Appt within 3-5 Days Complete - -  Home Care Screening - - Complete  Medication Review (RN CM) - - Complete  HRI or Home Care Consult Complete - -  Social Work Consult for [place] Planning/Counseling Complete Complete -  Palliative Care Screening Not Applicable Not Applicable -  Medication Review Press photographer) Complete - -  Some recent data might be hidden

## 2021-04-18 DIAGNOSIS — I509 Heart failure, unspecified: Secondary | ICD-10-CM | POA: Diagnosis not present

## 2021-04-18 DIAGNOSIS — R062 Wheezing: Secondary | ICD-10-CM | POA: Diagnosis not present

## 2021-04-18 DIAGNOSIS — I259 Chronic ischemic heart disease, unspecified: Secondary | ICD-10-CM | POA: Diagnosis not present

## 2021-04-18 DIAGNOSIS — I2510 Atherosclerotic heart disease of native coronary artery without angina pectoris: Secondary | ICD-10-CM | POA: Diagnosis not present

## 2021-04-19 DIAGNOSIS — E1151 Type 2 diabetes mellitus with diabetic peripheral angiopathy without gangrene: Secondary | ICD-10-CM | POA: Diagnosis not present

## 2021-04-19 DIAGNOSIS — E1122 Type 2 diabetes mellitus with diabetic chronic kidney disease: Secondary | ICD-10-CM | POA: Diagnosis not present

## 2021-04-19 DIAGNOSIS — L97829 Non-pressure chronic ulcer of other part of left lower leg with unspecified severity: Secondary | ICD-10-CM | POA: Diagnosis not present

## 2021-04-19 DIAGNOSIS — D631 Anemia in chronic kidney disease: Secondary | ICD-10-CM | POA: Diagnosis not present

## 2021-04-19 DIAGNOSIS — I5042 Chronic combined systolic (congestive) and diastolic (congestive) heart failure: Secondary | ICD-10-CM | POA: Diagnosis not present

## 2021-04-19 DIAGNOSIS — I872 Venous insufficiency (chronic) (peripheral): Secondary | ICD-10-CM | POA: Diagnosis not present

## 2021-04-19 DIAGNOSIS — L97819 Non-pressure chronic ulcer of other part of right lower leg with unspecified severity: Secondary | ICD-10-CM | POA: Diagnosis not present

## 2021-04-19 DIAGNOSIS — N1832 Chronic kidney disease, stage 3b: Secondary | ICD-10-CM | POA: Diagnosis not present

## 2021-04-19 DIAGNOSIS — I130 Hypertensive heart and chronic kidney disease with heart failure and stage 1 through stage 4 chronic kidney disease, or unspecified chronic kidney disease: Secondary | ICD-10-CM | POA: Diagnosis not present

## 2021-04-22 ENCOUNTER — Other Ambulatory Visit: Payer: Self-pay

## 2021-04-22 NOTE — Patient Outreach (Signed)
[facility]) Care Management  [employer]  04/22/2021   Scott [name] 1933/05/04 [id number]  Subjective: Telephone call to patient for weekly TOC. Patient reports managing good. Weight 210 lbs. Encouraged to keep up the great work.  Reiterated heart failure management.  He verbalized understanding. Appointment with PCP on tomorrow. No transportation issues.    Objective:   Encounter Medications:  Outpatient Encounter Medications as of 04/22/2021  Medication Sig   ALPRAZolam (XANAX) 0.25 MG tablet Take 0.25 mg by mouth at bedtime.   aspirin 81 MG EC tablet Take 81 mg by mouth at bedtime.   atorvastatin (LIPITOR) 20 MG tablet Take 1 tablet (20 mg total) by mouth daily.   Cholecalciferol (VITAMIN D) 50 MCG (2000 UT) tablet Take 2,000 Units by mouth daily.   colchicine 0.6 MG tablet Take 0.6-1.2 mg by mouth See admin instructions. Take 2 tablets (1.2 mg) by mouth at onset of gout attack, may take 1 tablet (0.6 mg) one hour later if still needed. Max 3 tablets in 3 days   Febuxostat 80 MG TABS Take 40 mg by mouth daily.   ferrous sulfate 325 (65 FE) MG tablet Take 325 mg by mouth every Monday, Wednesday, and Friday.   fluticasone (FLONASE) 50 MCG/ACT nasal spray Place into both nostrils daily.   insulin aspart protamine- aspart (NOVOLOG MIX 70/30) (70-30) 100 UNIT/ML injection Inject 30 Units into the skin 2 (two) times daily with a meal.   latanoprost (XALATAN) 0.005 % ophthalmic solution Place 1 drop into both eyes at bedtime.   Multiple Vitamin (MULTIVITAMIN WITH MINERALS) TABS tablet Take 1 tablet by mouth daily.   nitroGLYCERIN (NITROSTAT) 0.4 MG SL tablet Place 1 tablet (0.4 mg total) under the tongue every 5 (five) minutes as needed for chest pain (up to 3 doses).   oxyCODONE-acetaminophen (PERCOCET/ROXICET) 5-325 MG tablet Take 1 tablet by mouth every 6 (six) hours as needed for severe pain.   PRESCRIPTION MEDICATION Inhale into the lungs at bedtime. CPAP    tamsulosin (FLOMAX) 0.4 MG CAPS capsule Take 0.4 mg by mouth at bedtime.   torsemide (DEMADEX) 20 MG tablet Take 2 tablets (40 mg total) by mouth daily.   No facility-administered encounter medications on file as of 04/22/2021.    Functional Status:  In your present state of health, do you have any difficulty performing the following activities: 04/15/2021 04/09/2021  Hearing? N N  Vision? N N  Difficulty concentrating or making decisions? N N  Walking or climbing stairs? Y Y  Comment weakness and shortness of breath -  Dressing or bathing? N N  Doing errands, shopping? N N  Preparing Food and eating ? N -  Using the Toilet? N -  In the past six months, have you accidently leaked urine? N -  Do you have problems with loss of bowel control? N -  Managing your Medications? N -  Managing your Finances? N -  Housekeeping or managing your Housekeeping? Y -  Comment brother assists -  Some recent data might be hidden    Fall/Depression Screening: Fall Risk  04/15/2021  Falls in the past year? 0   PHQ 2/9 Scores 04/15/2021 10/24/2017  PHQ - 2 Score 0 0    Assessment:   Care Plan Care Plan : General Plan of Care (Adult)  Updates made by Jon Billings, RN since 04/22/2021 12:00 AM     Problem: Health Promotion or Disease Self-Management (General Plan of Care)  Goal: Self-Management Plan Developed as evidenced by no re-hospitalization within 30 days.   Start Date: 04/15/2021  Expected End Date: 06/10/2021  This Visit's Progress: On track  Priority: High  Note:   Evidence-based guidance:   Evoke change talk using open-ended questions, pros and cons, as well as looking forward.  Identify areas where behavior change may lead to improved health.  Partner with patient to develop a robust self-management plan that includes lifestyle factors, such as weight loss, exercise and healthy nutrition, as well as goals specific to disease risks.  Support patient and family/caregiver active  participation in decision-making and self-management plan.  Implement additional goals and interventions based on identified risk factors to reduce health risk.   Notes: 04/22/21 Patient reports doing ok. [facility].  Patient has home aide starting through the [facility] per day 5 days a week.  Patient has no problems with getting to appointments.      Task: Mutually Develop and Royce Macadamia Achievement of Patient Goals   Due Date: 05/10/2021  Priority: Routine  Responsible User: Jon Billings, RN  Note:   Care Management Activities:    - collaboration with team encouraged - questions answered - reassurance provided    Notes: 04/15/21 Patient with recent hospitalization related to heart failure.  Patient reports feeling better. Patient lives alone but brother helps and lives near by. Patient manages medication and able to review medication listing.  Patient agreeable to [facility] telephonic outreach.  Patient has follow up with cardiology scheduled.  Patient has not had a chance to call PCP but will be this morning.  04/22/21 Patient to see [facility] PCP on tomorrow.  No problems with transportation.  [facility] at this [facility] for 4 hrs per day 5 days a week.  Patient feels good about where he is with his care.      Care Plan : Heart Failure (Adult)  Updates made by Jon Billings, RN since 04/22/2021 12:00 AM     Problem: Symptom Exacerbation (Heart Failure)      Long-Range Goal: Symptom Exacerbation Prevented or Minimized as evidenced by no heart failure exacerbation   Start Date: 04/15/2021  Expected End Date: 10/10/2021  This Visit's Progress: On track  Priority: High  Note:   Evidence-based guidance:   Establish a mutually-agreed-upon early intervention process to communicate with primary care provider when signs/symptoms worsen.  Facilitate timely posthospital discharge or emergency department treatment that includes intensive follow-up via telephone calls, home visit,  telehealth monitoring and care at multidisciplinary heart failure clinic.  Adjust frequency and intensity of follow-up based on presentation, number of emergency department visits, hospital admissions and frequency and severity of symptom exacerbation.  Facilitate timely visit, usually within 1 week, with primary care provider following hospital discharge.  Collaborate with clinical pharmacist to address adverse drug reactions, drug interactions, subtherapeutic dosage, patient and family education.  Regularly screen for presence of depressive symptoms using a validated tool; consider pharmacologic therapy and/or referral for cognitive behavioral therapy when present.  Refer to community-based services, such as a heart failure support group, community Economist or peer support program.   Prepare patient for home oxygen use based on signs/symptoms.    Notes: Patient weight 210 lbs. Encouraged continued daily weights.      Task: Identify and Minimize Risk of Heart Failure Exacerbation   Due Date: 10/10/2021  Priority: Routine  Responsible User: Jon Billings, RN  Note:   Care Management Activities:    -  healthy lifestyle promoted - in-home support services arranged - rescue (action) plan reviewed - self-awareness of signs/symptoms of worsening disease encouraged     Notes: 04/15/21 Patient weight 209 lbs. Encouraged weights daily and notification of MD for any changes to avoid hospitalization. Heart Failure Management Discussed Please weight daily or as ordered by your doctor Report to your doctor weight gain of 2 -3 pounds in a day or 5 pounds in a week Limit salt intake Monitor for shortness of breath, swelling of feet, ankles or abdomen and weight gain. Never use the saltshaker.  Read all food labels and avoid canned, processed, and pickled foods.   Follow your doctor's recommendations for daily salt intake.  04/22/21 Patient continues to manage heart failure.  Patient weight 210  lbs. Reiterated heart failure management.       Goals Addressed             This Visit's Progress    COMPLETED: Make and Keep All Appointments   On track    Barriers: Knowledge Timeframe:  Short-Term Goal Priority:  High Start Date:    04/15/21                         Expected End Date:05/10/21                     Follow Up Date 04/23/21   - arrange a ride through an agency 1 week before appointment - call to cancel if needed    Why is this important?   Part of staying healthy is seeing the doctor for follow-up care.  If you forget your appointments, there are some things you can do to stay on track.    Notes: 04/15/21 Patient reports his brother takes him to appointments.  Patient has cardiology scheduled and will be calling PCP for hospital follow up.   04/22/21 No problems with transportation at this time.        Track and Manage Symptoms-Heart Failure   On track    Barriers: Health Behaviors Knowledge Timeframe:  Long-Range Goal Priority:  High Start Date:    04/15/21                         Expected End Date:     10/10/21                  Follow Up Date 05/10/21   - follow rescue plan if symptoms flare-up - know when to call the doctor    Why is this important?   You will be able to handle your symptoms better if you keep track of them.  Making some simple changes to your lifestyle will help.  Eating healthy is one thing you can do to take good care of yourself.    Notes: 04/15/21 Patient weight 209 lbs. Encouraged weights daily and notification of MD for any changes to avoid hospitalization. Heart Failure Management Discussed Please weight daily or as ordered by your doctor Report to your doctor weight gain of 2 -3 pounds in a day or 5 pounds in a week Limit salt intake Monitor for shortness of breath, swelling of feet, ankles or abdomen and weight gain. Never use the saltshaker.  Read all food labels and avoid canned, processed, and pickled foods.   Follow your  doctor's recommendations for daily salt intake.  04/22/21 Patient weight 210 lbs. Reiterated heart failure management.  Patient voices  no concerns.         Plan:  Follow-up: Patient agrees to Care Plan and Follow-up. Follow-up in 1 week(s)  Jone Baseman, RN, MSN [place] Management Care Management Coordinator Direct Line [phone] Cell [phone] Toll Free: [phone]  Fax: [phone]

## 2021-04-23 ENCOUNTER — Other Ambulatory Visit: Payer: Self-pay

## 2021-04-23 NOTE — Patient Outreach (Signed)
[facility]) Care Management  04/23/2021  Scott [name] [birth year] [id number]   Date of Review: 04/23/21 Reason:  Readmission  PCP: Dr. Reubin Milan Insurance: [employer] Medical Info: [name] is a 87 y.o. male past medical history of chronic thrombocytopenia, diabetes mellitus type 2, essential hypertension chronic kidney disease stage III, chronic combined systolic and diastolic heart failure iron deficiency anemia. Patient lives in the home alone but has support of brother [name] who lives nearby. Brother [name] takes him to appointments and does grocery shopping.  Patient uses walker and cane for ambulation. He is independent with ADL's. Patient manages his medications independently.  Admissions: Patient with 2 admissions in 30 days  Patient admitted 03/09/21 to 03/16/21 presented to the hospital complaining of comes into the hospital for shortness of breath and several days of black tarry stool.  In the ED was found to have a hemoglobin of 6 GI was consulted and admitted to the hospital. Patient found to have acute upper GI bleed and worsening renal function.   Patient discharged to SNF for strengthening.  He was discharged from SNF on 04/04/21 home with home health.     Patient admitted on 04/09/21 to 04/14/21 presented with dyspnea and lower extremity edema after advisement from cardiology. Patient diagnosed with acute congestive heart failure   Patient treated with IV Lasix and switched to torsemide for heart failure management. He was discharged home with home health.  Disposition: Patient discharged home with home health.  04/15/21 Patient active with [facility] CM at this time for care coordination needs, education and support.    Recommendation: None  Jone Baseman, RN, MSN [place] Management Care Management Coordinator Direct Line [phone] Cell [phone] Toll Free: [phone]  Fax: [phone]

## 2021-04-24 ENCOUNTER — Ambulatory Visit: Payer: Medicare Other | Admitting: Podiatry

## 2021-04-24 ENCOUNTER — Other Ambulatory Visit: Payer: Self-pay

## 2021-04-24 ENCOUNTER — Telehealth: Payer: Self-pay | Admitting: Cardiology

## 2021-04-24 DIAGNOSIS — R160 Hepatomegaly, not elsewhere classified: Secondary | ICD-10-CM | POA: Insufficient documentation

## 2021-04-24 DIAGNOSIS — E861 Hypovolemia: Secondary | ICD-10-CM | POA: Insufficient documentation

## 2021-04-24 DIAGNOSIS — M7750 Other enthesopathy of unspecified foot: Secondary | ICD-10-CM | POA: Insufficient documentation

## 2021-04-24 DIAGNOSIS — D631 Anemia in chronic kidney disease: Secondary | ICD-10-CM | POA: Insufficient documentation

## 2021-04-24 DIAGNOSIS — I872 Venous insufficiency (chronic) (peripheral): Secondary | ICD-10-CM | POA: Insufficient documentation

## 2021-04-24 DIAGNOSIS — Z741 Need for assistance with personal care: Secondary | ICD-10-CM | POA: Insufficient documentation

## 2021-04-24 DIAGNOSIS — K458 Other specified abdominal hernia without obstruction or gangrene: Secondary | ICD-10-CM | POA: Insufficient documentation

## 2021-04-24 DIAGNOSIS — A0472 Enterocolitis due to Clostridium difficile, not specified as recurrent: Secondary | ICD-10-CM | POA: Insufficient documentation

## 2021-04-24 DIAGNOSIS — C44310 Basal cell carcinoma of skin of unspecified parts of face: Secondary | ICD-10-CM | POA: Insufficient documentation

## 2021-04-24 DIAGNOSIS — K222 Esophageal obstruction: Secondary | ICD-10-CM | POA: Insufficient documentation

## 2021-04-24 DIAGNOSIS — R9431 Abnormal electrocardiogram [ECG] [EKG]: Secondary | ICD-10-CM | POA: Insufficient documentation

## 2021-04-24 DIAGNOSIS — G8929 Other chronic pain: Secondary | ICD-10-CM | POA: Insufficient documentation

## 2021-04-24 DIAGNOSIS — R059 Cough, unspecified: Secondary | ICD-10-CM | POA: Insufficient documentation

## 2021-04-24 DIAGNOSIS — M1250 Traumatic arthropathy, unspecified site: Secondary | ICD-10-CM | POA: Insufficient documentation

## 2021-04-24 DIAGNOSIS — C189 Malignant neoplasm of colon, unspecified: Secondary | ICD-10-CM | POA: Insufficient documentation

## 2021-04-24 DIAGNOSIS — R52 Pain, unspecified: Secondary | ICD-10-CM | POA: Insufficient documentation

## 2021-04-24 DIAGNOSIS — L438 Other lichen planus: Secondary | ICD-10-CM | POA: Insufficient documentation

## 2021-04-24 DIAGNOSIS — N62 Hypertrophy of breast: Secondary | ICD-10-CM | POA: Insufficient documentation

## 2021-04-24 DIAGNOSIS — R69 Illness, unspecified: Secondary | ICD-10-CM | POA: Insufficient documentation

## 2021-04-24 DIAGNOSIS — Z23 Encounter for immunization: Secondary | ICD-10-CM | POA: Insufficient documentation

## 2021-04-24 DIAGNOSIS — I5042 Chronic combined systolic (congestive) and diastolic (congestive) heart failure: Secondary | ICD-10-CM

## 2021-04-24 DIAGNOSIS — R197 Diarrhea, unspecified: Secondary | ICD-10-CM | POA: Insufficient documentation

## 2021-04-24 DIAGNOSIS — M4800 Spinal stenosis, site unspecified: Secondary | ICD-10-CM | POA: Insufficient documentation

## 2021-04-24 DIAGNOSIS — L570 Actinic keratosis: Secondary | ICD-10-CM | POA: Insufficient documentation

## 2021-04-24 DIAGNOSIS — H269 Unspecified cataract: Secondary | ICD-10-CM | POA: Insufficient documentation

## 2021-04-24 DIAGNOSIS — D509 Iron deficiency anemia, unspecified: Secondary | ICD-10-CM | POA: Insufficient documentation

## 2021-04-24 DIAGNOSIS — F1010 Alcohol abuse, uncomplicated: Secondary | ICD-10-CM | POA: Insufficient documentation

## 2021-04-24 DIAGNOSIS — Z951 Presence of aortocoronary bypass graft: Secondary | ICD-10-CM | POA: Insufficient documentation

## 2021-04-24 DIAGNOSIS — D487 Neoplasm of uncertain behavior of other specified sites: Secondary | ICD-10-CM | POA: Insufficient documentation

## 2021-04-24 DIAGNOSIS — H401132 Primary open-angle glaucoma, bilateral, moderate stage: Secondary | ICD-10-CM | POA: Insufficient documentation

## 2021-04-24 DIAGNOSIS — S98131S 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, sequela: Secondary | ICD-10-CM | POA: Insufficient documentation

## 2021-04-24 DIAGNOSIS — M25569 Pain in unspecified knee: Secondary | ICD-10-CM | POA: Insufficient documentation

## 2021-04-24 DIAGNOSIS — I671 Cerebral aneurysm, nonruptured: Secondary | ICD-10-CM | POA: Insufficient documentation

## 2021-04-24 DIAGNOSIS — E1142 Type 2 diabetes mellitus with diabetic polyneuropathy: Secondary | ICD-10-CM | POA: Insufficient documentation

## 2021-04-24 DIAGNOSIS — E860 Dehydration: Secondary | ICD-10-CM | POA: Insufficient documentation

## 2021-04-24 DIAGNOSIS — M109 Gout, unspecified: Secondary | ICD-10-CM | POA: Insufficient documentation

## 2021-04-24 DIAGNOSIS — D696 Thrombocytopenia, unspecified: Secondary | ICD-10-CM | POA: Insufficient documentation

## 2021-04-24 DIAGNOSIS — M545 Low back pain, unspecified: Secondary | ICD-10-CM | POA: Insufficient documentation

## 2021-04-24 DIAGNOSIS — L97909 Non-pressure chronic ulcer of unspecified part of unspecified lower leg with unspecified severity: Secondary | ICD-10-CM | POA: Insufficient documentation

## 2021-04-24 DIAGNOSIS — E785 Hyperlipidemia, unspecified: Secondary | ICD-10-CM | POA: Insufficient documentation

## 2021-04-24 NOTE — Telephone Encounter (Signed)
Pt c/o Shortness Of Breath: STAT if SOB developed within the last 24 hours or pt is noticeably SOB on the phone  1. Are you currently SOB (can you hear that pt is SOB on the phone)? yes  2. How long have you been experiencing SOB? 5 days ago  3. Are you SOB when sitting or when up moving around?  Getting worse, both sitting and walking around   4. Are you currently experiencing any other symptoms? Little weak

## 2021-04-24 NOTE — Telephone Encounter (Signed)
Patient called in, stating that he has had 10 lbs in 5 days. He is SOB (noted on phone call as well), he does mention having swelling, denies chest pain. He was seen in ED on 06/30 for CHF, and they gave him Torsemide 40 mg (20 mg tablets) he takes 1 in the morning, and 1 in the PM. He denies diet changes. I discussed with patient I would speak with DOD (Dr.Crenshaw) he reviewed blood work, suggested to increase Torsemide to 40 mg twice daily for 2 days, come for BMET, BNP on Monday. I ordered blood work, and advised with patient in regards to these instructions. Patient verbalized understanding.  Will route to primary MD to make aware.   Upcoming appointment on 08/25, he will decide if needed any sooner.

## 2021-04-27 ENCOUNTER — Other Ambulatory Visit: Payer: Self-pay

## 2021-04-27 DIAGNOSIS — I5042 Chronic combined systolic (congestive) and diastolic (congestive) heart failure: Secondary | ICD-10-CM

## 2021-04-27 LAB — BASIC METABOLIC PANEL
BUN/Creatinine Ratio: 29 — ABNORMAL HIGH (ref 10–24)
BUN: 64 mg/dL — ABNORMAL HIGH (ref 8–27)
CO2: 23 mmol/L (ref 20–29)
Calcium: 8.5 mg/dL — ABNORMAL LOW (ref 8.6–10.2)
Chloride: 99 mmol/L (ref 96–106)
Creatinine, Ser: 2.17 mg/dL — ABNORMAL HIGH (ref 0.76–1.27)
Glucose: 116 mg/dL — ABNORMAL HIGH (ref 65–99)
Potassium: 4.7 mmol/L (ref 3.5–5.2)
Sodium: 137 mmol/L (ref 134–144)
eGFR: 29 mL/min/{1.73_m2} — ABNORMAL LOW (ref >59)

## 2021-04-28 LAB — BRAIN NATRIURETIC PEPTIDE: BNP: 2686.2 pg/mL — ABNORMAL HIGH (ref 0.0–100.0)

## 2021-04-29 ENCOUNTER — Other Ambulatory Visit: Payer: Self-pay

## 2021-04-29 ENCOUNTER — Telehealth: Payer: Self-pay | Admitting: Cardiology

## 2021-04-29 MED ORDER — TORSEMIDE 20 MG PO TABS: 40.0000 mg | ORAL_TABLET | Freq: Two times a day (BID) | ORAL | 0 refills | Status: DC

## 2021-04-29 NOTE — Telephone Encounter (Signed)
Spoke with patient of Dr. Martinique who reports swelling, weight gain, shortness of breath. He reports he has gained 12lbs in 1 week, swelling in bilateral LE and abdomen that is pushing up against his lungs. He took increased dose of torsemide (40mg  BID) for 2 days and then resumed 40mg  daily.  He was notified of recent lab results by [facility] LPN today and relayed a similar message  Per Dr. Martinique:  Peter M Martinique, MD  04/28/2021  1:57 PM EDT      The following abnormalities are noted:  BNP is higher.  All other values are normal, stable or within acceptable limits. Medication changes / Follow up labs / Other changes or recommendations:   I would recommend continuing torsemide 40 mg bid since BNP higher, recentweight gain and renal parameters are better.   Peter Martinique, MD 04/28/2021 1:56 PM    Advise patient he should continue increased dose of torsemide 40mg  BID as directed, monitor weights, and call on Friday with an update on symptoms.

## 2021-04-29 NOTE — Telephone Encounter (Signed)
Pt c/o swelling: STAT is pt has developed SOB within 24 hours  If swelling, where is the swelling located? All over his body   How much weight have you gained and in what time span? 12 pounds in a week   Have you gained 3 pounds in a day or 5 pounds in a week? 5pounds in a week   Do you have a log of your daily weights (if so, list)? NO  Are you currently taking a fluid pill? Yes  Are you currently SOB? Yes   Have you traveled recently?     Pt c/o Shortness Of Breath: STAT if SOB developed within the last 24 hours or pt is noticeably SOB on the phone  1. Are you currently SOB (can you hear that pt is SOB on the phone)? Yes  2. How long have you been experiencing SOB? A few weeks  3. Are you SOB when sitting or when up moving around? both  4. Are you currently experiencing any other symptoms? Swelling

## 2021-04-29 NOTE — Patient Instructions (Signed)
Goals Addressed             This Visit's Progress    Track and Manage Symptoms-Heart Failure   On track    Barriers: Health Behaviors Knowledge Timeframe:  Long-Range Goal Priority:  High Start Date:    04/15/21                         Expected End Date:     10/10/21                  Follow Up Date 05/10/21   - follow rescue plan if symptoms flare-up - know when to call the doctor    Why is this important?   You will be able to handle your symptoms better if you keep track of them.  Making some simple changes to your lifestyle will help.  Eating healthy is one thing you can do to take good care of yourself.    Notes: 04/15/21 Patient weight 209 lbs. Encouraged weights daily and notification of MD for any changes to avoid hospitalization. Heart Failure Management Discussed Please weight daily or as ordered by your doctor Report to your doctor weight gain of 2 -3 pounds in a day or 5 pounds in a week Limit salt intake Monitor for shortness of breath, swelling of feet, ankles or abdomen and weight gain. Never use the saltshaker.  Read all food labels and avoid canned, processed, and pickled foods.   Follow your doctor's recommendations for daily salt intake.  04/22/21 Patient weight 210 lbs. Reiterated heart failure management.  Patient voices no concerns. 04/29/21 Patient weight now 220 lbs patient waiting to hear from cardiology office concerning next steps.  Encouraged patient to continue to monitor for changing symptoms of heart failure such as severe shortness of breath, fever, chest pain and uncontrollable cough. Advised to call 911 for severe symptoms.

## 2021-04-29 NOTE — Patient Outreach (Signed)
[place]) Care Management  [place]  04/29/2021   Scott [name] [birth year] [id number]  Subjective: Telephone call to patient for follow up.Patient reports his weight is up to 220 lbs and his BNP is elevated.  He has spoken with cardiology office this am and waiting for response as they called about lab report this morning. Discussed with patient about severe heart failure symptoms and calling 911 if symptoms change.  He verbalized understanding and states he will wait for MD to call back.    Objective:   Encounter Medications:  Outpatient Encounter Medications as of 04/29/2021  Medication Sig   ALPRAZolam (XANAX) 0.25 MG tablet Take 0.25 mg by mouth at bedtime.   ALPRAZolam (XANAX) 0.25 MG tablet Take 1 tablet by mouth at bedtime.   aspirin 81 MG EC tablet Take 81 mg by mouth at bedtime.   atorvastatin (LIPITOR) 20 MG tablet Take 1 tablet (20 mg total) by mouth daily.   carvedilol (COREG) 12.5 MG tablet Take 0.5 tablets by mouth 2 (two) times daily.   Cholecalciferol (VITAMIN D) 50 MCG (2000 UT) tablet Take 2,000 Units by mouth daily.   Cholecalciferol 50 MCG (2000 UT) TABS Take 1 tablet by mouth daily.   colchicine 0.6 MG tablet Take 0.6-1.2 mg by mouth See admin instructions. Take 2 tablets (1.2 mg) by mouth at onset of gout attack, may take 1 tablet (0.6 mg) one hour later if still needed. Max 3 tablets in 3 days   Febuxostat 80 MG TABS Take 40 mg by mouth daily.   ferrous sulfate 325 (65 FE) MG tablet Take 325 mg by mouth every Monday, Wednesday, and Friday.   fluticasone (FLONASE) 50 MCG/ACT nasal spray Place into both nostrils daily.   fluticasone (FLONASE) 50 MCG/ACT nasal spray Place into the nose.   Glycerin, Laxative, (GLYCERIN, ADULT, RE) Place rectally.   hydrocortisone (ANUSOL-HC) 25 MG suppository Place rectally.   insulin aspart protamine- aspart (NOVOLOG MIX 70/30) (70-30) 100 UNIT/ML injection Inject 30 Units into the skin 2 (two) times daily  with a meal.   latanoprost (XALATAN) 0.005 % ophthalmic solution Place 1 drop into both eyes at bedtime.   Multiple Vitamin (MULTIVITAMIN WITH MINERALS) TABS tablet Take 1 tablet by mouth daily.   nitroGLYCERIN (NITROSTAT) 0.4 MG SL tablet Place 1 tablet (0.4 mg total) under the tongue every 5 (five) minutes as needed for chest pain (up to 3 doses).   oxyCODONE-acetaminophen (PERCOCET/ROXICET) 5-325 MG tablet Take 1 tablet by mouth every 6 (six) hours as needed for severe pain.   PRESCRIPTION MEDICATION Inhale into the lungs at bedtime. CPAP   tamsulosin (FLOMAX) 0.4 MG CAPS capsule Take 0.4 mg by mouth at bedtime.   torsemide (DEMADEX) 20 MG tablet Take 2 tablets (40 mg total) by mouth daily.   No facility-administered encounter medications on file as of 04/29/2021.    Functional Status:  In your present state of health, do you have any difficulty performing the following activities: 04/15/2021 04/09/2021  Hearing? N N  Vision? N N  Difficulty concentrating or making decisions? N N  Walking or climbing stairs? Y Y  Comment weakness and shortness of breath -  Dressing or bathing? N N  Doing errands, shopping? N N  Preparing Food and eating ? N -  Using the Toilet? N -  In the past six months, have you accidently leaked urine? N -  Do you have problems with loss of bowel control? N -  Managing your  Medications? N -  Managing your Finances? N -  Housekeeping or managing your Housekeeping? Y -  Comment brother assists -  Some recent data might be hidden    Fall/Depression Screening: Fall Risk  04/15/2021  Falls in the past year? 0   PHQ 2/9 Scores 04/15/2021 10/24/2017  PHQ - 2 Score 0 0    Assessment:   Care Plan Care Plan : General Plan of Care (Adult)  Updates made by Jon Billings, RN since 04/29/2021 12:00 AM     Problem: Health Promotion or Disease Self-Management (General Plan of Care)      Goal: [place] as evidenced by no re-hospitalization within  30 days.   Start Date: 04/15/2021  Expected End Date: 06/10/2021  This Visit's Progress: On track  Recent Progress: On track  Priority: High  Note:   Evidence-based guidance:    Partner with patient to develop a robust self-management plan that includes lifestyle factors, such as weight loss, exercise and healthy nutrition, as well as goals specific to disease risks.  Support patient and family/caregiver active participation in decision-making and self-management plan.  Implement additional goals and interventions based on identified risk factors to reduce health risk.   Notes: 04/22/21 Patient reports doing ok. [facility]  Patient has home aide starting through the [facility] per day 5 days a week.  Patient has no problems with getting to appointments.   04/29/21 Patient has seen [facility] physician.  However patient having some increases in weight and shortness of breath. Patient taking torsemide 40 mg BID.  Patient has called cardiology today as weight is 2 lbs more today. Patient waiting to hear back from physician.     Task: Mutually Develop and Royce Macadamia Achievement of Patient Goals   Due Date: 05/10/2021  Priority: Routine  Responsible User: Jon Billings, RN  Note:   Care Management Activities:    - collaboration with team encouraged    Notes: 04/15/21 Patient with recent hospitalization related to heart failure.  Patient reports feeling better. Patient lives alone but brother helps and lives near by. Patient manages medication and able to review medication listing.  Patient agreeable to [facility] telephonic outreach.  Patient has follow up with cardiology scheduled.  Patient has not had a chance to call PCP but will be this morning.  04/22/21 Patient to see [place] PCP on tomorrow.  No problems with transportation.  [facility] for 4 hrs per day 5 days a week.  Patient feels good about where he is with his care.   04/29/21 Patient has seen [facility] physician.  However  patient having some increases in weight and shortness of breath. Patient taking torsemide 40 mg BID.  Patient has called cardiology today as weight is 2 lbs more today. Patient waiting to hear back from physician. [facility]. Patient reports he did hear from the agency to let him know they are still working on his in home care.     Care Plan : Heart Failure (Adult)  Updates made by Jon Billings, RN since 04/29/2021 12:00 AM     Problem: Symptom Exacerbation (Heart Failure)      Long-Range Goal: Symptom Exacerbation Prevented or Minimized as evidenced by no heart failure exacerbation   Start Date: 04/15/2021  Expected End Date: 10/10/2021  This Visit's Progress: On track  Recent Progress: On track  Priority: High  Note:   Evidence-based guidance:  Establish a mutually-agreed-upon early intervention process to communicate with primary care provider when signs/symptoms worsen.  Adjust frequency and intensity of follow-up based on presentation, number of emergency department visits, hospital admissions and frequency and severity of symptom exacerbation.    Refer to community-based services, such as a heart failure support group, community Economist or peer support program.   Prepare patient for home oxygen use based on signs/symptoms.    Notes: Patient weight 210 lbs. Encouraged continued daily weights.   04/29/21 Patient weight now 220 lbs patient waiting to hear from cardiology office concerning next steps.  Encouraged patient to continue to monitor for changing symptoms of heart failure such as severe shortness of breath, fever, chest pain and uncontrollable cough. Advised to call 911 for sever symptoms.      Task: Identify and Minimize Risk of Heart Failure Exacerbation   Due Date: 10/10/2021  Priority: Routine  Responsible User: Jon Billings, RN  Note:   Care Management Activities:    - rescue (action) plan reviewed - self-awareness of signs/symptoms of  worsening disease encouraged     Notes: 04/15/21 Patient weight 209 lbs. Encouraged weights daily and notification of MD for any changes to avoid hospitalization. Heart Failure Management Discussed Please weight daily or as ordered by your doctor Report to your doctor weight gain of 2 -3 pounds in a day or 5 pounds in a week Limit salt intake Monitor for shortness of breath, swelling of feet, ankles or abdomen and weight gain. Never use the saltshaker.  Read all food labels and avoid canned, processed, and pickled foods.   Follow your doctor's recommendations for daily salt intake.  04/22/21 Patient continues to manage heart failure.  Patient weight 210 lbs. Reiterated heart failure management.  04/29/21 Patient weight now 220 lbs patient waiting to hear from cardiology office concerning next steps.  Encouraged patient to continue to monitor for changing symptoms of heart failure such as severe shortness of breath, fever, chest pain and uncontrollable cough. Advised to call 911 for severe symptoms.      Goals Addressed             This Visit's Progress    Track and Manage Symptoms-Heart Failure   On track    Barriers: Health Behaviors Knowledge Timeframe:  Long-Range Goal Priority:  High Start Date:    04/15/21                         Expected End Date:     10/10/21                  Follow Up Date 05/10/21   - follow rescue plan if symptoms flare-up - know when to call the doctor    Why is this important?   You will be able to handle your symptoms better if you keep track of them.  Making some simple changes to your lifestyle will help.  Eating healthy is one thing you can do to take good care of yourself.    Notes: 04/15/21 Patient weight 209 lbs. Encouraged weights daily and notification of MD for any changes to avoid hospitalization. Heart Failure Management Discussed Please weight daily or as ordered by your doctor Report to your doctor weight gain of 2 -3 pounds in a day or  5 pounds in a week Limit salt intake Monitor for shortness of breath, swelling of feet, ankles or abdomen and weight gain. Never use the saltshaker.  Read all  food labels and avoid canned, processed, and pickled foods.   Follow your doctor's recommendations for daily salt intake.  04/22/21 Patient weight 210 lbs. Reiterated heart failure management.  Patient voices no concerns. 04/29/21 Patient weight now 220 lbs patient waiting to hear from cardiology office concerning next steps.  Encouraged patient to continue to monitor for changing symptoms of heart failure such as severe shortness of breath, fever, chest pain and uncontrollable cough. Advised to call 911 for severe symptoms.        Plan:  Follow-up: Patient agrees to Care Plan and Follow-up. Follow-up in 1 week(s)  Jone Baseman, RN, MSN [place] Management Care Management Coordinator Direct Line [phone] Cell [phone] Toll Free: [phone]  Fax: [phone]

## 2021-04-30 DIAGNOSIS — E1140 Type 2 diabetes mellitus with diabetic neuropathy, unspecified: Secondary | ICD-10-CM | POA: Diagnosis not present

## 2021-04-30 DIAGNOSIS — K250 Acute gastric ulcer with hemorrhage: Secondary | ICD-10-CM | POA: Diagnosis not present

## 2021-04-30 DIAGNOSIS — I872 Venous insufficiency (chronic) (peripheral): Secondary | ICD-10-CM | POA: Diagnosis not present

## 2021-04-30 DIAGNOSIS — E1151 Type 2 diabetes mellitus with diabetic peripheral angiopathy without gangrene: Secondary | ICD-10-CM | POA: Diagnosis not present

## 2021-04-30 DIAGNOSIS — L03115 Cellulitis of right lower limb: Secondary | ICD-10-CM | POA: Diagnosis not present

## 2021-04-30 DIAGNOSIS — L97211 Non-pressure chronic ulcer of right calf limited to breakdown of skin: Secondary | ICD-10-CM | POA: Diagnosis not present

## 2021-04-30 DIAGNOSIS — I130 Hypertensive heart and chronic kidney disease with heart failure and stage 1 through stage 4 chronic kidney disease, or unspecified chronic kidney disease: Secondary | ICD-10-CM | POA: Diagnosis not present

## 2021-04-30 DIAGNOSIS — Z4800 Encounter for change or removal of nonsurgical wound dressing: Secondary | ICD-10-CM | POA: Diagnosis not present

## 2021-04-30 DIAGNOSIS — M109 Gout, unspecified: Secondary | ICD-10-CM | POA: Diagnosis not present

## 2021-05-01 ENCOUNTER — Telehealth: Payer: Self-pay | Admitting: *Deleted

## 2021-05-01 MED ORDER — TORSEMIDE 20 MG PO TABS
ORAL_TABLET | ORAL | 0 refills | Status: DC
Start: 2021-05-01 — End: 2021-05-20

## 2021-05-01 NOTE — Telephone Encounter (Signed)
Called spoke to patient . Information given to patient per Dr Doug Sou orders.  Patient verbalized understanding. 80 mg in the morning and 40 mg I the evening.   Patient state he has another medication at present time . Patient states he will call  back if needed.  Medication list changed to reflect the changes.prescription was not sent to pharmacy

## 2021-05-01 NOTE — Telephone Encounter (Signed)
-----   Message from Peter M Martinique, MD sent at 04/28/2021  1:57 PM EDT ----- The following abnormalities are noted:  BNP is higher.  All other values are normal, stable or within acceptable limits. Medication changes / Follow up labs / Other changes or recommendations:   I would recommend continuing torsemide 40 mg bid since BNP higher, recent weight gain and renal parameters are better.  Peter Martinique, MD 04/28/2021 1:56 PM

## 2021-05-01 NOTE — Telephone Encounter (Signed)
I had recommended increasing torsemide to 80 mg in am and 40 mg in pm  Nashayla Telleria Martinique MD, [facility]

## 2021-05-01 NOTE — Telephone Encounter (Signed)
Spoke to patient , result  given.  Patient states he does not think he is eliminating enough fluid sine the increase of torsemide 40 mg twice a day.    Patient states  His weight was 05/01/21      224 lbs today  Yesterday 04/30/21              222 lbs                    04/29/21              220 lbs Patient aware will defer to Dr Martinique

## 2021-05-04 ENCOUNTER — Inpatient Hospital Stay (HOSPITAL_COMMUNITY)
Admission: EM | Admit: 2021-05-04 | Discharge: 2021-05-20 | DRG: 291 | Disposition: A | Discharge status: Home Health | Payer: No Typology Code available for payment source | Attending: Internal Medicine | Admitting: Internal Medicine

## 2021-05-04 ENCOUNTER — Emergency Department (HOSPITAL_COMMUNITY): Payer: No Typology Code available for payment source

## 2021-05-04 ENCOUNTER — Other Ambulatory Visit: Payer: Self-pay

## 2021-05-04 ENCOUNTER — Telehealth: Payer: Self-pay | Admitting: Cardiology

## 2021-05-04 DIAGNOSIS — E669 Obesity, unspecified: Secondary | ICD-10-CM | POA: Diagnosis present

## 2021-05-04 DIAGNOSIS — Z8249 Family history of ischemic heart disease and other diseases of the circulatory system: Secondary | ICD-10-CM

## 2021-05-04 DIAGNOSIS — E11621 Type 2 diabetes mellitus with foot ulcer: Secondary | ICD-10-CM | POA: Diagnosis present

## 2021-05-04 DIAGNOSIS — I4510 Unspecified right bundle-branch block: Secondary | ICD-10-CM | POA: Diagnosis present

## 2021-05-04 DIAGNOSIS — Z794 Long term (current) use of insulin: Secondary | ICD-10-CM

## 2021-05-04 DIAGNOSIS — Z85038 Personal history of other malignant neoplasm of large intestine: Secondary | ICD-10-CM

## 2021-05-04 DIAGNOSIS — I739 Peripheral vascular disease, unspecified: Secondary | ICD-10-CM | POA: Diagnosis present

## 2021-05-04 DIAGNOSIS — E1142 Type 2 diabetes mellitus with diabetic polyneuropathy: Secondary | ICD-10-CM | POA: Diagnosis present

## 2021-05-04 DIAGNOSIS — I10 Essential (primary) hypertension: Secondary | ICD-10-CM | POA: Diagnosis present

## 2021-05-04 DIAGNOSIS — Z20822 Contact with and (suspected) exposure to covid-19: Secondary | ICD-10-CM | POA: Diagnosis present

## 2021-05-04 DIAGNOSIS — I130 Hypertensive heart and chronic kidney disease with heart failure and stage 1 through stage 4 chronic kidney disease, or unspecified chronic kidney disease: Secondary | ICD-10-CM | POA: Diagnosis not present

## 2021-05-04 DIAGNOSIS — R9431 Abnormal electrocardiogram [ECG] [EKG]: Secondary | ICD-10-CM | POA: Diagnosis present

## 2021-05-04 DIAGNOSIS — Z66 Do not resuscitate: Secondary | ICD-10-CM | POA: Diagnosis present

## 2021-05-04 DIAGNOSIS — I2510 Atherosclerotic heart disease of native coronary artery without angina pectoris: Secondary | ICD-10-CM | POA: Diagnosis present

## 2021-05-04 DIAGNOSIS — J811 Chronic pulmonary edema: Secondary | ICD-10-CM | POA: Diagnosis not present

## 2021-05-04 DIAGNOSIS — K219 Gastro-esophageal reflux disease without esophagitis: Secondary | ICD-10-CM | POA: Diagnosis present

## 2021-05-04 DIAGNOSIS — I509 Heart failure, unspecified: Secondary | ICD-10-CM

## 2021-05-04 DIAGNOSIS — E1151 Type 2 diabetes mellitus with diabetic peripheral angiopathy without gangrene: Secondary | ICD-10-CM | POA: Diagnosis not present

## 2021-05-04 DIAGNOSIS — I272 Pulmonary hypertension, unspecified: Secondary | ICD-10-CM | POA: Diagnosis present

## 2021-05-04 DIAGNOSIS — L97529 Non-pressure chronic ulcer of other part of left foot with unspecified severity: Secondary | ICD-10-CM | POA: Diagnosis present

## 2021-05-04 DIAGNOSIS — Z89421 Acquired absence of other right toe(s): Secondary | ICD-10-CM

## 2021-05-04 DIAGNOSIS — K250 Acute gastric ulcer with hemorrhage: Secondary | ICD-10-CM | POA: Diagnosis not present

## 2021-05-04 DIAGNOSIS — I872 Venous insufficiency (chronic) (peripheral): Secondary | ICD-10-CM | POA: Diagnosis not present

## 2021-05-04 DIAGNOSIS — D509 Iron deficiency anemia, unspecified: Secondary | ICD-10-CM | POA: Diagnosis present

## 2021-05-04 DIAGNOSIS — Z951 Presence of aortocoronary bypass graft: Secondary | ICD-10-CM

## 2021-05-04 DIAGNOSIS — J90 Pleural effusion, not elsewhere classified: Secondary | ICD-10-CM | POA: Diagnosis not present

## 2021-05-04 DIAGNOSIS — E119 Type 2 diabetes mellitus without complications: Secondary | ICD-10-CM

## 2021-05-04 DIAGNOSIS — L97519 Non-pressure chronic ulcer of other part of right foot with unspecified severity: Secondary | ICD-10-CM | POA: Diagnosis present

## 2021-05-04 DIAGNOSIS — I5082 Biventricular heart failure: Secondary | ICD-10-CM | POA: Diagnosis present

## 2021-05-04 DIAGNOSIS — Z79899 Other long term (current) drug therapy: Secondary | ICD-10-CM

## 2021-05-04 DIAGNOSIS — Z86010 Personal history of colonic polyps: Secondary | ICD-10-CM

## 2021-05-04 DIAGNOSIS — Z7982 Long term (current) use of aspirin: Secondary | ICD-10-CM

## 2021-05-04 DIAGNOSIS — I255 Ischemic cardiomyopathy: Secondary | ICD-10-CM | POA: Diagnosis present

## 2021-05-04 DIAGNOSIS — D696 Thrombocytopenia, unspecified: Secondary | ICD-10-CM | POA: Diagnosis present

## 2021-05-04 DIAGNOSIS — N179 Acute kidney failure, unspecified: Secondary | ICD-10-CM | POA: Diagnosis present

## 2021-05-04 DIAGNOSIS — I5043 Acute on chronic combined systolic (congestive) and diastolic (congestive) heart failure: Principal | ICD-10-CM | POA: Diagnosis present

## 2021-05-04 DIAGNOSIS — I77819 Aortic ectasia, unspecified site: Secondary | ICD-10-CM | POA: Diagnosis present

## 2021-05-04 DIAGNOSIS — Z9049 Acquired absence of other specified parts of digestive tract: Secondary | ICD-10-CM

## 2021-05-04 DIAGNOSIS — N184 Chronic kidney disease, stage 4 (severe): Secondary | ICD-10-CM | POA: Diagnosis present

## 2021-05-04 DIAGNOSIS — E860 Dehydration: Secondary | ICD-10-CM | POA: Diagnosis present

## 2021-05-04 DIAGNOSIS — R0602 Shortness of breath: Secondary | ICD-10-CM | POA: Diagnosis not present

## 2021-05-04 DIAGNOSIS — Z4800 Encounter for change or removal of nonsurgical wound dressing: Secondary | ICD-10-CM | POA: Diagnosis not present

## 2021-05-04 DIAGNOSIS — E1122 Type 2 diabetes mellitus with diabetic chronic kidney disease: Secondary | ICD-10-CM | POA: Diagnosis present

## 2021-05-04 DIAGNOSIS — M109 Gout, unspecified: Secondary | ICD-10-CM | POA: Diagnosis present

## 2021-05-04 DIAGNOSIS — L97211 Non-pressure chronic ulcer of right calf limited to breakdown of skin: Secondary | ICD-10-CM | POA: Diagnosis not present

## 2021-05-04 DIAGNOSIS — I83019 Varicose veins of right lower extremity with ulcer of unspecified site: Secondary | ICD-10-CM | POA: Diagnosis present

## 2021-05-04 DIAGNOSIS — R079 Chest pain, unspecified: Secondary | ICD-10-CM | POA: Diagnosis not present

## 2021-05-04 DIAGNOSIS — I724 Aneurysm of artery of lower extremity: Secondary | ICD-10-CM | POA: Diagnosis present

## 2021-05-04 DIAGNOSIS — E1140 Type 2 diabetes mellitus with diabetic neuropathy, unspecified: Secondary | ICD-10-CM | POA: Diagnosis not present

## 2021-05-04 DIAGNOSIS — R188 Other ascites: Secondary | ICD-10-CM | POA: Diagnosis present

## 2021-05-04 DIAGNOSIS — Z841 Family history of disorders of kidney and ureter: Secondary | ICD-10-CM

## 2021-05-04 DIAGNOSIS — I440 Atrioventricular block, first degree: Secondary | ICD-10-CM | POA: Diagnosis present

## 2021-05-04 DIAGNOSIS — N183 Chronic kidney disease, stage 3 unspecified: Secondary | ICD-10-CM | POA: Diagnosis present

## 2021-05-04 DIAGNOSIS — Z6830 Body mass index (BMI) 30.0-30.9, adult: Secondary | ICD-10-CM

## 2021-05-04 DIAGNOSIS — I252 Old myocardial infarction: Secondary | ICD-10-CM

## 2021-05-04 DIAGNOSIS — Z9861 Coronary angioplasty status: Secondary | ICD-10-CM

## 2021-05-04 DIAGNOSIS — J8410 Pulmonary fibrosis, unspecified: Secondary | ICD-10-CM | POA: Diagnosis present

## 2021-05-04 DIAGNOSIS — E785 Hyperlipidemia, unspecified: Secondary | ICD-10-CM | POA: Diagnosis present

## 2021-05-04 DIAGNOSIS — Z87891 Personal history of nicotine dependence: Secondary | ICD-10-CM

## 2021-05-04 DIAGNOSIS — E8809 Other disorders of plasma-protein metabolism, not elsewhere classified: Secondary | ICD-10-CM

## 2021-05-04 DIAGNOSIS — G4733 Obstructive sleep apnea (adult) (pediatric): Secondary | ICD-10-CM | POA: Diagnosis present

## 2021-05-04 DIAGNOSIS — L03115 Cellulitis of right lower limb: Secondary | ICD-10-CM | POA: Diagnosis not present

## 2021-05-04 DIAGNOSIS — K7460 Unspecified cirrhosis of liver: Secondary | ICD-10-CM | POA: Diagnosis present

## 2021-05-04 DIAGNOSIS — I5084 End stage heart failure: Secondary | ICD-10-CM | POA: Diagnosis present

## 2021-05-04 LAB — CBC WITH DIFFERENTIAL/PLATELET
Abs Immature Granulocytes: 0.03 10*3/uL (ref 0.00–0.07)
Basophils Absolute: 0.1 10*3/uL (ref 0.0–0.1)
Basophils Relative: 1 %
Eosinophils Absolute: 0.2 10*3/uL (ref 0.0–0.5)
Eosinophils Relative: 3 %
HCT: 35.2 % — ABNORMAL LOW (ref 39.0–52.0)
Hemoglobin: 10 g/dL — ABNORMAL LOW (ref 13.0–17.0)
Immature Granulocytes: 0 %
Lymphocytes Relative: 13 %
Lymphs Abs: 1 10*3/uL (ref 0.7–4.0)
MCH: 28.1 pg (ref 26.0–34.0)
MCHC: 28.4 g/dL — ABNORMAL LOW (ref 30.0–36.0)
MCV: 98.9 fL (ref 80.0–100.0)
Monocytes Absolute: 1.2 10*3/uL — ABNORMAL HIGH (ref 0.1–1.0)
Monocytes Relative: 17 %
Neutro Abs: 4.8 10*3/uL (ref 1.7–7.7)
Neutrophils Relative %: 66 %
Platelets: 122 10*3/uL — ABNORMAL LOW (ref 150–400)
RBC: 3.56 MIL/uL — ABNORMAL LOW (ref 4.22–5.81)
RDW: 17.5 % — ABNORMAL HIGH (ref 11.5–15.5)
WBC: 7.3 10*3/uL (ref 4.0–10.5)
nRBC: 0 % (ref 0.0–0.2)

## 2021-05-04 LAB — BRAIN NATRIURETIC PEPTIDE: B Natriuretic Peptide: 2626.7 pg/mL — ABNORMAL HIGH (ref 0.0–100.0)

## 2021-05-04 LAB — COMPREHENSIVE METABOLIC PANEL
ALT: 11 U/L (ref 0–44)
AST: 26 U/L (ref 15–41)
Albumin: 2.8 g/dL — ABNORMAL LOW (ref 3.5–5.0)
Alkaline Phosphatase: 106 U/L (ref 38–126)
Anion gap: 11 (ref 5–15)
BUN: 71 mg/dL — ABNORMAL HIGH (ref 8–23)
CO2: 27 mmol/L (ref 22–32)
Calcium: 8.6 mg/dL — ABNORMAL LOW (ref 8.9–10.3)
Chloride: 96 mmol/L — ABNORMAL LOW (ref 98–111)
Creatinine, Ser: 2.42 mg/dL — ABNORMAL HIGH (ref 0.61–1.24)
GFR, Estimated: 25 mL/min — ABNORMAL LOW (ref >60)
Glucose, Bld: 169 mg/dL — ABNORMAL HIGH (ref 70–99)
Potassium: 4.4 mmol/L (ref 3.5–5.1)
Sodium: 134 mmol/L — ABNORMAL LOW (ref 135–145)
Total Bilirubin: 1 mg/dL (ref 0.3–1.2)
Total Protein: 7.2 g/dL (ref 6.5–8.1)

## 2021-05-04 NOTE — Telephone Encounter (Signed)
He needs to be seen. I am DOD on Wednesday so I can see him then. Would just work him in sometime between 8:30-9:30. I know template doesn't have a slot then but I am ok with it.   Argil Mahl Martinique MD, [facility]

## 2021-05-04 NOTE — Patient Outreach (Signed)
[facility]) Care Management  05/04/2021  VARNELL ORVIS [birth year] [id number]   Incoming call from Saunemin at Dr.  Doug Sou office concerning patient status.  Discussed patient heart failure and weight gain status.  She says she called and patient did not answer. Verified patient phone number. She will try [phone].

## 2021-05-04 NOTE — Telephone Encounter (Signed)
Pt updated with Dr. Doug Sou recommendations. Pt verbalized understanding. Appointment scheduled for 7/27 at 9 am.

## 2021-05-04 NOTE — Telephone Encounter (Signed)
Dionne is calling with concerns of the pt's weight increase.

## 2021-05-04 NOTE — Telephone Encounter (Signed)
Home nurse call back to say that she going to take the patient to the hosptial because he has gain 5lbs since Thursday. She knows he has appt on Wednesday but express that's too long for him to have to wait.

## 2021-05-04 NOTE — Patient Outreach (Signed)
[facility]) Care Management  [place]  05/04/2021   Scott [name] 1933-06-16 [id number]  Subjective: Telephone call to patient. He reports weight is up to 225 lbs despite increasing torsemide.  Patient to call cardiologist office.  CM also called cardiologist office with concerns about weight gain. Message left with Caryl Pina for the nurse.  She states that she will send to the nurse and if the nurse has questions she will outreach to CM.    Objective:   Encounter Medications:  Outpatient Encounter Medications as of 05/04/2021  Medication Sig   ALPRAZolam (XANAX) 0.25 MG tablet Take 0.25 mg by mouth at bedtime.   ALPRAZolam (XANAX) 0.25 MG tablet Take 1 tablet by mouth at bedtime.   aspirin 81 MG EC tablet Take 81 mg by mouth at bedtime.   atorvastatin (LIPITOR) 20 MG tablet Take 1 tablet (20 mg total) by mouth daily.   carvedilol (COREG) 12.5 MG tablet Take 0.5 tablets by mouth 2 (two) times daily.   Cholecalciferol (VITAMIN D) 50 MCG (2000 UT) tablet Take 2,000 Units by mouth daily.   Cholecalciferol 50 MCG (2000 UT) TABS Take 1 tablet by mouth daily.   colchicine 0.6 MG tablet Take 0.6-1.2 mg by mouth See admin instructions. Take 2 tablets (1.2 mg) by mouth at onset of gout attack, may take 1 tablet (0.6 mg) one hour later if still needed. Max 3 tablets in 3 days   Febuxostat 80 MG TABS Take 40 mg by mouth daily.   ferrous sulfate 325 (65 FE) MG tablet Take 325 mg by mouth every Monday, Wednesday, and Friday.   fluticasone (FLONASE) 50 MCG/ACT nasal spray Place into both nostrils daily.   fluticasone (FLONASE) 50 MCG/ACT nasal spray Place into the nose.   Glycerin, Laxative, (GLYCERIN, ADULT, RE) Place rectally.   hydrocortisone (ANUSOL-HC) 25 MG suppository Place rectally.   insulin aspart protamine- aspart (NOVOLOG MIX 70/30) (70-30) 100 UNIT/ML injection Inject 30 Units into the skin 2 (two) times daily with a meal.   latanoprost (XALATAN) 0.005 %  ophthalmic solution Place 1 drop into both eyes at bedtime.   Multiple Vitamin (MULTIVITAMIN WITH MINERALS) TABS tablet Take 1 tablet by mouth daily.   nitroGLYCERIN (NITROSTAT) 0.4 MG SL tablet Place 1 tablet (0.4 mg total) under the tongue every 5 (five) minutes as needed for chest pain (up to 3 doses).   oxyCODONE-acetaminophen (PERCOCET/ROXICET) 5-325 MG tablet Take 1 tablet by mouth every 6 (six) hours as needed for severe pain.   PRESCRIPTION MEDICATION Inhale into the lungs at bedtime. CPAP   tamsulosin (FLOMAX) 0.4 MG CAPS capsule Take 0.4 mg by mouth at bedtime.   torsemide (DEMADEX) 20 MG tablet Take 80 mg ( 4 tablets) in th morning and 40 mg ( 2 tablets)  in the evening.   No facility-administered encounter medications on file as of 05/04/2021.    Functional Status:  In your present state of health, do you have any difficulty performing the following activities: 04/15/2021 04/09/2021  Hearing? N N  Vision? N N  Difficulty concentrating or making decisions? N N  Walking or climbing stairs? Y Y  Comment weakness and shortness of breath -  Dressing or bathing? N N  Doing errands, shopping? N N  Preparing Food and eating ? N -  Using the Toilet? N -  In the past six months, have you accidently leaked urine? N -  Do you have problems with loss of bowel control? N -  Managing  your Medications? N -  Managing your Finances? N -  Housekeeping or managing your Housekeeping? Y -  Comment brother assists -  Some recent data might be hidden    Fall/Depression Screening: Fall Risk  04/15/2021  Falls in the past year? 0   PHQ 2/9 Scores 04/15/2021 10/24/2017  PHQ - 2 Score 0 0    Assessment:   Care Plan Care Plan : General Plan of Care (Adult)  Updates made by Jon Billings, RN since 05/04/2021 12:00 AM     Problem: Health Promotion or Disease Self-Management (General Plan of Care)      Goal: [place] as evidenced by no re-hospitalization within 30 days.    Start Date: 04/15/2021  Expected End Date: 06/10/2021  This Visit's Progress: On track  Recent Progress: On track  Priority: High  Note:   Evidence-based guidance:    Partner with patient to develop a robust self-management plan that includes lifestyle factors, such as weight loss, exercise and healthy nutrition, as well as goals specific to disease risks.  Support patient and family/caregiver active participation in decision-making and self-management plan.  Implement additional goals and interventions based on identified risk factors to reduce health risk.   Notes: 04/22/21 [facility] reports doing ok. [facility]  Patient has home aide starting through the [facility] per day 5 days a week.  Patient has no problems with getting to appointments.   04/29/21 Patient has seen [facility] physician.  However patient having some increases in weight and shortness of breath. Patient taking torsemide 40 mg BID.  Patient has called cardiology today as weight is 2 lbs more today. Patient waiting to hear back from physician.   05/04/21 Patient weight 225 lbs today.  Torsemide dose is 80 mg in the am and 40 in the pm per patient. Patient to call cardiologist today. Discussed diet and explained how salty foods cause him to gain fluid. Patient denies adding salt to diet. Patient denies drinking things such as gatorade or eating things such as canned or pickled foods.  CM called cardiologist also to address weight gain as well.     Task: Mutually Develop and Royce Macadamia Achievement of Patient Goals   Due Date: 06/10/2021  Priority: Routine  Responsible User: Jon Billings, RN  Note:   Care Management Activities:    - collaboration with team encouraged    Notes: 04/15/21 Patient with recent hospitalization related to heart failure.  Patient reports feeling better. Patient lives alone but brother helps and lives near by. Patient manages medication and able to review medication listing.  Patient agreeable to [facility]  telephonic outreach.  Patient has follow up with cardiology scheduled.  Patient has not had a chance to call PCP but will be this morning.  04/22/21 Patient to see [facility] PCP on tomorrow.  No problems with transportation.  [facility] at [facility] for 4 hrs per day 5 days a week.  Patient feels good about where he is with his care.   04/29/21 Patient has seen [facility] physician.  However patient having some increases in weight and shortness of breath. Patient taking torsemide 40 mg BID.  Patient has called cardiology today as weight is 2 lbs more today. Patient waiting to hear back from physician. [facility]. Patient reports he did hear from the agency to let him know they are still working on his in home care.   05/04/21 Patient weight 225  lbs today.  Torsemide dose is 80 mg in the am and 40 in the pm per patient. Patient to call cardiologist today. Discussed diet and explained how salty foods cause him to gain fluid. Patient denies adding salt to diet. Patient denies drinking things such as gatorade or eating things such as canned or pickled foods.  CM called cardiologist also to address weight gain as well.     Care Plan : Heart Failure (Adult)  Updates made by Jon Billings, RN since 05/04/2021 12:00 AM     Problem: Symptom Exacerbation (Heart Failure)      Long-Range Goal: Symptom Exacerbation Prevented or Minimized as evidenced by no heart failure exacerbation   Start Date: 04/15/2021  Expected End Date: 10/10/2021  This Visit's Progress: On track  Recent Progress: On track  Priority: High  Note:   Evidence-based guidance:   Establish a mutually-agreed-upon early intervention process to communicate with primary care provider when signs/symptoms worsen.  Adjust frequency and intensity of follow-up based on presentation, number of emergency department visits, hospital admissions and frequency and severity of symptom exacerbation.    Refer to community-based  services, such as a heart failure support group, community Economist or peer support program.   Prepare patient for home oxygen use based on signs/symptoms.    Notes: Patient weight 210 lbs. Encouraged continued daily weights.   04/29/21 Patient weight now 220 lbs patient waiting to hear from cardiology office concerning next steps.  Encouraged patient to continue to monitor for changing symptoms of heart failure such as severe shortness of breath, fever, chest pain and uncontrollable cough. Advised to call 911 for sever symptoms.   05/04/21 Patient weight 225 lbs today.  Torsemide dose is 80 mg in the am and 40 in the pm per patient. Patient to call cardiologist today. Discussed diet and explained how salty foods cause him to gain fluid. Patient denies adding salt to diet. Patient denies drinking things such as gatorade or eating things such as canned or pickled foods.  CM called cardiologist also to address weight gain as well.     Task: Identify and Minimize Risk of Heart Failure Exacerbation   Due Date: 10/10/2021  Priority: Routine  Responsible User: Jon Billings, RN  Note:   Care Management Activities:    - rescue (action) plan reviewed - self-awareness of signs/symptoms of worsening disease encouraged     Notes: 04/15/21 Patient weight 209 lbs. Encouraged weights daily and notification of MD for any changes to avoid hospitalization. Heart Failure Management Discussed Please weight daily or as ordered by your doctor Report to your doctor weight gain of 2 -3 pounds in a day or 5 pounds in a week Limit salt intake Monitor for shortness of breath, swelling of feet, ankles or abdomen and weight gain. Never use the saltshaker.  Read all food labels and avoid canned, processed, and pickled foods.   Follow your doctor's recommendations for daily salt intake.  04/22/21 Patient continues to manage heart failure.  Patient weight 210 lbs. Reiterated heart failure management.  04/29/21  Patient weight now 220 lbs patient waiting to hear from cardiology office concerning next steps.  Encouraged patient to continue to monitor for changing symptoms of heart failure such as severe shortness of breath, fever, chest pain and uncontrollable cough. Advised to call 911 for severe symptoms.  05/04/21 Patient weight 225 lbs today.  Torsemide dose is 80 mg in the am and 40 in the pm per patient. Patient to call cardiologist today.  Discussed diet and explained how salty foods cause him to gain fluid. Patient denies adding salt to diet. Patient denies drinking things such as gatorade or eating things such as canned or pickled foods.  CM called cardiologist also to address weight gain as well.       Goals Addressed             This Visit's Progress    Track and Manage Symptoms-Heart Failure   On track    Barriers: Health Behaviors Knowledge Timeframe:  Long-Range Goal Priority:  High Start Date:    04/15/21                         Expected End Date:     10/10/21                  Follow Up Date 05/10/21   - follow rescue plan if symptoms flare-up - know when to call the doctor    Why is this important?   You will be able to handle your symptoms better if you keep track of them.  Making some simple changes to your lifestyle will help.  Eating healthy is one thing you can do to take good care of yourself.    Notes: 04/15/21 Patient weight 209 lbs. Encouraged weights daily and notification of MD for any changes to avoid hospitalization. Heart Failure Management Discussed Please weight daily or as ordered by your doctor Report to your doctor weight gain of 2 -3 pounds in a day or 5 pounds in a week Limit salt intake Monitor for shortness of breath, swelling of feet, ankles or abdomen and weight gain. Never use the saltshaker.  Read all food labels and avoid canned, processed, and pickled foods.   Follow your doctor's recommendations for daily salt intake.  04/22/21 Patient weight 210  lbs. Reiterated heart failure management.  Patient voices no concerns. 04/29/21 Patient weight now 220 lbs patient waiting to hear from cardiology office concerning next steps.  Encouraged patient to continue to monitor for changing symptoms of heart failure such as severe shortness of breath, fever, chest pain and uncontrollable cough. Advised to call 911 for severe symptoms. 05/04/21 Patient weight 225 lbs.  Discussion about food appropriate to prevent fluid weight gain. Patient reports continued shortness of breath as well. Patient to outreach cardiologist today.  Reviewed signs of changing heart failure.          Plan:  Follow-up: Patient agrees to Care Plan and Follow-up. Follow-up in 3-7 business day(s)  Jone Baseman, RN, MSN [facility] Care Management Coordinator Direct Line [phone] Cell [phone] Toll Free: [phone]  Fax: [phone]

## 2021-05-04 NOTE — ED Provider Notes (Signed)
Emergency Medicine Provider Triage Evaluation Note  Scott [name] , a 87 y.o. male  was evaluated in triage.  Pt complains of shortness of breath, reports that he has fluid built up.  States he has gained 15 pounds over the past 4 days.  Patient admits to shortness of breath, denies any chest pain to me.  Is on torsemide, has been compliant with it.  Review of Systems  Positive: Shortness of breath, fluid gain Negative: Fevers  Physical Exam  BP 129/73 (BP Location: Left Arm)   Pulse 71   Temp 97.6 F (36.4 C) (Oral)   Resp 17   SpO2 97%  Gen:   Awake, no distress   Resp:  Normal effort, Rales heard bilaterally MSK:   Moves extremities without difficulty  Other:  abdomen and legs are fluid overloaded.  Abdomen is distended.  Medical Decision Making  Medically screening exam initiated at 6:52 PM.  Appropriate orders placed.  Scott [name] was informed that the remainder of the evaluation will be completed by another provider, this initial triage assessment does not replace that evaluation, and the importance of remaining in the ED until their evaluation is complete.     Scott Client, PA-C 05/04/21 1900    Scott Sites, DO 05/04/21 [id number]

## 2021-05-04 NOTE — Telephone Encounter (Signed)
Called Scott [name] back she states that pt;s weight has increased to 225 today. We did increase torsemide to 80mg  and 40mg -PM. His weight 7-22 was 224 his weight has increased despite this increase. Notified Arts development officer (I think). Will forward for medication recommendations.

## 2021-05-04 NOTE — ED Triage Notes (Signed)
Pt c/o "fluid all over," reports SHOB & CP associated. States he's gained 15lbs since being seen last. Hx CHF, bypass, CAD, CKD3, HTN, compliant w home medication 6/10 shooting pains in feet

## 2021-05-05 DIAGNOSIS — E1142 Type 2 diabetes mellitus with diabetic polyneuropathy: Secondary | ICD-10-CM | POA: Diagnosis present

## 2021-05-05 DIAGNOSIS — Z20822 Contact with and (suspected) exposure to covid-19: Secondary | ICD-10-CM | POA: Diagnosis present

## 2021-05-05 DIAGNOSIS — I272 Pulmonary hypertension, unspecified: Secondary | ICD-10-CM | POA: Diagnosis present

## 2021-05-05 DIAGNOSIS — E11621 Type 2 diabetes mellitus with foot ulcer: Secondary | ICD-10-CM | POA: Diagnosis present

## 2021-05-05 DIAGNOSIS — I5043 Acute on chronic combined systolic (congestive) and diastolic (congestive) heart failure: Secondary | ICD-10-CM | POA: Diagnosis not present

## 2021-05-05 DIAGNOSIS — I5084 End stage heart failure: Secondary | ICD-10-CM | POA: Diagnosis present

## 2021-05-05 DIAGNOSIS — R062 Wheezing: Secondary | ICD-10-CM | POA: Diagnosis not present

## 2021-05-05 DIAGNOSIS — L97519 Non-pressure chronic ulcer of other part of right foot with unspecified severity: Secondary | ICD-10-CM | POA: Diagnosis present

## 2021-05-05 DIAGNOSIS — E119 Type 2 diabetes mellitus without complications: Secondary | ICD-10-CM | POA: Diagnosis not present

## 2021-05-05 DIAGNOSIS — K7460 Unspecified cirrhosis of liver: Secondary | ICD-10-CM | POA: Diagnosis present

## 2021-05-05 DIAGNOSIS — R188 Other ascites: Secondary | ICD-10-CM | POA: Diagnosis not present

## 2021-05-05 DIAGNOSIS — I70244 Atherosclerosis of native arteries of left leg with ulceration of heel and midfoot: Secondary | ICD-10-CM | POA: Diagnosis not present

## 2021-05-05 DIAGNOSIS — I259 Chronic ischemic heart disease, unspecified: Secondary | ICD-10-CM | POA: Diagnosis not present

## 2021-05-05 DIAGNOSIS — E785 Hyperlipidemia, unspecified: Secondary | ICD-10-CM | POA: Diagnosis not present

## 2021-05-05 DIAGNOSIS — L0390 Cellulitis, unspecified: Secondary | ICD-10-CM | POA: Diagnosis not present

## 2021-05-05 DIAGNOSIS — I2510 Atherosclerotic heart disease of native coronary artery without angina pectoris: Secondary | ICD-10-CM | POA: Diagnosis not present

## 2021-05-05 DIAGNOSIS — I10 Essential (primary) hypertension: Secondary | ICD-10-CM | POA: Diagnosis not present

## 2021-05-05 DIAGNOSIS — I83019 Varicose veins of right lower extremity with ulcer of unspecified site: Secondary | ICD-10-CM | POA: Diagnosis present

## 2021-05-05 DIAGNOSIS — Z66 Do not resuscitate: Secondary | ICD-10-CM | POA: Diagnosis present

## 2021-05-05 DIAGNOSIS — L97529 Non-pressure chronic ulcer of other part of left foot with unspecified severity: Secondary | ICD-10-CM | POA: Diagnosis not present

## 2021-05-05 DIAGNOSIS — Z6830 Body mass index (BMI) 30.0-30.9, adult: Secondary | ICD-10-CM | POA: Diagnosis not present

## 2021-05-05 DIAGNOSIS — E669 Obesity, unspecified: Secondary | ICD-10-CM | POA: Diagnosis present

## 2021-05-05 DIAGNOSIS — E860 Dehydration: Secondary | ICD-10-CM | POA: Diagnosis present

## 2021-05-05 DIAGNOSIS — E1122 Type 2 diabetes mellitus with diabetic chronic kidney disease: Secondary | ICD-10-CM | POA: Diagnosis present

## 2021-05-05 DIAGNOSIS — I724 Aneurysm of artery of lower extremity: Secondary | ICD-10-CM | POA: Diagnosis present

## 2021-05-05 DIAGNOSIS — I130 Hypertensive heart and chronic kidney disease with heart failure and stage 1 through stage 4 chronic kidney disease, or unspecified chronic kidney disease: Secondary | ICD-10-CM | POA: Diagnosis present

## 2021-05-05 DIAGNOSIS — N1832 Chronic kidney disease, stage 3b: Secondary | ICD-10-CM | POA: Diagnosis not present

## 2021-05-05 DIAGNOSIS — D509 Iron deficiency anemia, unspecified: Secondary | ICD-10-CM | POA: Diagnosis present

## 2021-05-05 DIAGNOSIS — N179 Acute kidney failure, unspecified: Secondary | ICD-10-CM | POA: Diagnosis present

## 2021-05-05 DIAGNOSIS — I5023 Acute on chronic systolic (congestive) heart failure: Secondary | ICD-10-CM | POA: Diagnosis not present

## 2021-05-05 DIAGNOSIS — I509 Heart failure, unspecified: Secondary | ICD-10-CM | POA: Diagnosis not present

## 2021-05-05 DIAGNOSIS — N184 Chronic kidney disease, stage 4 (severe): Secondary | ICD-10-CM | POA: Diagnosis present

## 2021-05-05 DIAGNOSIS — I739 Peripheral vascular disease, unspecified: Secondary | ICD-10-CM | POA: Diagnosis not present

## 2021-05-05 DIAGNOSIS — E1151 Type 2 diabetes mellitus with diabetic peripheral angiopathy without gangrene: Secondary | ICD-10-CM | POA: Diagnosis present

## 2021-05-05 DIAGNOSIS — D696 Thrombocytopenia, unspecified: Secondary | ICD-10-CM | POA: Diagnosis present

## 2021-05-05 DIAGNOSIS — I5082 Biventricular heart failure: Secondary | ICD-10-CM | POA: Diagnosis present

## 2021-05-05 DIAGNOSIS — I77819 Aortic ectasia, unspecified site: Secondary | ICD-10-CM | POA: Diagnosis present

## 2021-05-05 LAB — RESP PANEL BY RT-PCR (FLU A&B, COVID) ARPGX2
Influenza A by PCR: NEGATIVE
Influenza B by PCR: NEGATIVE
SARS Coronavirus 2 by RT PCR: NEGATIVE

## 2021-05-05 LAB — CBG MONITORING, ED: Glucose-Capillary: 113 mg/dL — ABNORMAL HIGH (ref 70–99)

## 2021-05-05 LAB — BASIC METABOLIC PANEL
Anion gap: 10 (ref 5–15)
BUN: 69 mg/dL — ABNORMAL HIGH (ref 8–23)
CO2: 27 mmol/L (ref 22–32)
Calcium: 8.5 mg/dL — ABNORMAL LOW (ref 8.9–10.3)
Chloride: 100 mmol/L (ref 98–111)
Creatinine, Ser: 2.13 mg/dL — ABNORMAL HIGH (ref 0.61–1.24)
GFR, Estimated: 30 mL/min — ABNORMAL LOW (ref >60)
Glucose, Bld: 128 mg/dL — ABNORMAL HIGH (ref 70–99)
Potassium: 4 mmol/L (ref 3.5–5.1)
Sodium: 137 mmol/L (ref 135–145)

## 2021-05-05 LAB — MAGNESIUM: Magnesium: 2 mg/dL (ref 1.7–2.4)

## 2021-05-05 MED ORDER — FEBUXOSTAT 40 MG PO TABS
40.0000 mg | ORAL_TABLET | Freq: Every day | ORAL | Status: DC
  Administered 2021-05-05 – 2021-05-20 (×16): 40 mg via ORAL
  Filled 2021-05-05 (×16): qty 1

## 2021-05-05 MED ORDER — FERROUS SULFATE 325 (65 FE) MG PO TABS
325.0000 mg | ORAL_TABLET | ORAL | Status: DC
  Administered 2021-05-06 – 2021-05-20 (×7): 325 mg via ORAL
  Filled 2021-05-05 (×12): qty 1

## 2021-05-05 MED ORDER — INSULIN ASPART 100 UNIT/ML IJ SOLN
0.0000 [IU] | Freq: Three times a day (TID) | INTRAMUSCULAR | Status: DC
  Administered 2021-05-06: 2 [IU] via SUBCUTANEOUS
  Administered 2021-05-06: 3 [IU] via SUBCUTANEOUS
  Administered 2021-05-07: 2 [IU] via SUBCUTANEOUS
  Administered 2021-05-07 – 2021-05-09 (×4): 1 [IU] via SUBCUTANEOUS
  Administered 2021-05-09: 2 [IU] via SUBCUTANEOUS
  Administered 2021-05-10: 1 [IU] via SUBCUTANEOUS
  Administered 2021-05-10 – 2021-05-11 (×2): 2 [IU] via SUBCUTANEOUS
  Administered 2021-05-11: 1 [IU] via SUBCUTANEOUS
  Administered 2021-05-12: 2 [IU] via SUBCUTANEOUS
  Administered 2021-05-12: 1 [IU] via SUBCUTANEOUS
  Administered 2021-05-12: 2 [IU] via SUBCUTANEOUS
  Administered 2021-05-13: 3 [IU] via SUBCUTANEOUS
  Administered 2021-05-13: 2 [IU] via SUBCUTANEOUS
  Administered 2021-05-13 – 2021-05-14 (×2): 1 [IU] via SUBCUTANEOUS
  Administered 2021-05-14 (×2): 2 [IU] via SUBCUTANEOUS

## 2021-05-05 MED ORDER — DOCUSATE SODIUM 100 MG PO CAPS: 100.0000 mg | ORAL_CAPSULE | Freq: Every day | ORAL | Status: DC | PRN

## 2021-05-05 MED ORDER — NITROGLYCERIN 0.4 MG SL SUBL: 0.4000 mg | SUBLINGUAL_TABLET | SUBLINGUAL | Status: DC | PRN

## 2021-05-05 MED ORDER — ASPIRIN 81 MG PO CHEW
81.0000 mg | CHEWABLE_TABLET | Freq: Every day | ORAL | Status: DC
  Administered 2021-05-05 – 2021-05-19 (×15): 81 mg via ORAL
  Filled 2021-05-05 (×15): qty 1

## 2021-05-05 MED ORDER — ALPRAZOLAM 0.25 MG PO TABS
0.2500 mg | ORAL_TABLET | Freq: Every day | ORAL | Status: DC
  Administered 2021-05-05 – 2021-05-19 (×15): 0.25 mg via ORAL
  Filled 2021-05-05 (×15): qty 1

## 2021-05-05 MED ORDER — FUROSEMIDE 10 MG/ML IJ SOLN
80.0000 mg | Freq: Once | INTRAMUSCULAR | Status: AC
  Administered 2021-05-05: 80 mg via INTRAVENOUS
  Filled 2021-05-05: qty 8

## 2021-05-05 MED ORDER — TAMSULOSIN HCL 0.4 MG PO CAPS
0.4000 mg | ORAL_CAPSULE | Freq: Every day | ORAL | Status: DC
  Administered 2021-05-05 – 2021-05-19 (×15): 0.4 mg via ORAL
  Filled 2021-05-05 (×15): qty 1

## 2021-05-05 MED ORDER — ADULT MULTIVITAMIN W/MINERALS CH
1.0000 | ORAL_TABLET | Freq: Every day | ORAL | Status: DC
  Administered 2021-05-05 – 2021-05-20 (×16): 1 via ORAL
  Filled 2021-05-05 (×16): qty 1

## 2021-05-05 MED ORDER — FEBUXOSTAT 80 MG PO TABS: 40.0000 mg | ORAL_TABLET | Freq: Every day | ORAL | Status: DC

## 2021-05-05 MED ORDER — LATANOPROST 0.005 % OP SOLN
1.0000 [drp] | Freq: Every day | OPHTHALMIC | Status: DC
  Administered 2021-05-05 – 2021-05-19 (×15): 1 [drp] via OPHTHALMIC
  Filled 2021-05-05: qty 2.5

## 2021-05-05 MED ORDER — SODIUM CHLORIDE 0.9 % IV SOLN: 250.0000 mL | INTRAVENOUS | Status: DC | PRN

## 2021-05-05 MED ORDER — FUROSEMIDE 10 MG/ML IJ SOLN
80.0000 mg | Freq: Two times a day (BID) | INTRAMUSCULAR | Status: DC
  Administered 2021-05-05 – 2021-05-06 (×2): 80 mg via INTRAVENOUS
  Filled 2021-05-05 (×2): qty 8

## 2021-05-05 MED ORDER — SODIUM CHLORIDE 0.9% FLUSH
3.0000 mL | Freq: Two times a day (BID) | INTRAVENOUS | Status: DC
  Administered 2021-05-05 – 2021-05-20 (×20): 3 mL via INTRAVENOUS

## 2021-05-05 MED ORDER — ACETAMINOPHEN 325 MG PO TABS
650.0000 mg | ORAL_TABLET | Freq: Four times a day (QID) | ORAL | Status: DC | PRN
  Administered 2021-05-05 – 2021-05-17 (×8): 650 mg via ORAL
  Filled 2021-05-05 (×9): qty 2

## 2021-05-05 MED ORDER — FLUTICASONE PROPIONATE 50 MCG/ACT NA SUSP
1.0000 | Freq: Every day | NASAL | Status: DC | PRN
  Filled 2021-05-05: qty 16

## 2021-05-05 MED ORDER — SODIUM CHLORIDE 0.9% FLUSH
3.0000 mL | INTRAVENOUS | Status: DC | PRN
  Administered 2021-05-06: 3 mL via INTRAVENOUS

## 2021-05-05 MED ORDER — VITAMIN D3 25 MCG (1000 UNIT) PO TABS
2000.0000 [IU] | ORAL_TABLET | ORAL | Status: DC
  Administered 2021-05-06 – 2021-05-18 (×6): 2000 [IU] via ORAL
  Filled 2021-05-05 (×14): qty 2

## 2021-05-05 MED ORDER — ACETAMINOPHEN 650 MG RE SUPP: 650.0000 mg | Freq: Four times a day (QID) | RECTAL | Status: DC | PRN

## 2021-05-05 MED ORDER — ATORVASTATIN CALCIUM 10 MG PO TABS
20.0000 mg | ORAL_TABLET | Freq: Every day | ORAL | Status: DC
  Administered 2021-05-05 – 2021-05-20 (×16): 20 mg via ORAL
  Filled 2021-05-05 (×16): qty 2

## 2021-05-05 MED ORDER — COLCHICINE 0.6 MG PO TABS: 0.6000 mg | ORAL_TABLET | ORAL | Status: DC

## 2021-05-05 MED ORDER — MUPIROCIN 2 % EX OINT
TOPICAL_OINTMENT | Freq: Two times a day (BID) | CUTANEOUS | Status: DC
  Administered 2021-05-07 – 2021-05-16 (×3): 1 via NASAL
  Filled 2021-05-05 (×5): qty 22

## 2021-05-05 NOTE — H&P (Addendum)
History and Physical    Scott [name]:[id number] DOB: 29-Mar-1933 DOA: 05/04/2021  PCP: Reubin Milan, MD Consultants:  cardiology: Dr. Martinique  vascular: Dr. Oneida Alar also goes to [place] Patient coming from:  Home - lives alone   Chief Complaint: shortness of breath   HPI: Scott [name] is a 87 y.o. male with medical history significant of  chronic systolic CHF, CAD status post CABG stenting, HTN, CKD stage III, HLD, GERD, GI bleed, chronic iron deficiency anemia, gout, thrombocytopenia, PAD.  He presents to ER for weight gain, edema and worsening shortness of breath.  [facility] and left with a weight of 210 pounds.  He has gained 15 pounds. He denies increased swelling in his legs, but feels like his stomach and scrotum are edematous. He has a mild cough. He denies any orthopnea. He has increased dyspnea with exertion. He also has some shortness of breath with sitting. He has been taking his torsemide 20mg  BID which he was discharged with on his last hospitalization. He increased this to 40mg  BID about one week ago. On 7/25 he increased his torsemide to 80mg  in the AM and 40mg  in the PM per cardiology note.   He denies any recent illness, fever/chills, chest pain, palpitations, stomach pain, N/V/D. Denies eating excess salt or excessive fluid intake.   ED Course: vitals: afebrile, BP: 129/73, HR: 71, RR: 17, oxgyen: 97% on room air.  Pertinent labs: , Creatinine: 2.42 and BUN: 71, Hgb: 10.0 (8.0-9.4), Platelets: 122 (45-64), Bnp: 2626.7. CXR with pulmonary congestion. Given 80mg  of IV lasix. Asked to admit for CHF exacerbation.   Review of Systems: As per HPI; otherwise review of systems reviewed and negative.   Ambulatory Status:  Ambulates with walker    Past Medical History:  Diagnosis Date   Acute biliary pancreatitis 10/2017   with choledocholithiasis.  ERCP, stone extraction performed   Arthritis    CAD (coronary artery disease)    a. 1997 CABGx3  (VG->RPDA, VG->PLV, LIMA->LAD);  b. 07/2004 PCI VG->PLV (3.5x16 Taxus DES, 3.5x12 Taxus DES); c. 11/2009 PCI: VG->PLV 3.5x12 Promus DES), VG->RPDA (3.5x15 Promus DES); d. 08/2012 Cath: patent grafts; e. 05/2013 Lexiscan MV: no ischemia/infarction, EF 44%.   Cataract    Chronic combined systolic and diastolic CHF (congestive heart failure) ([place]) 08/2012   a. EF 50-55% 11/2011; b. 08/2012 - EF 25-30%,  with mild RM, mod TR, mod RAE & LAE, moderate reduced RV systolic function and PASP of 63mmHg. - > cath showed patent grafts & Mod Puml HTN wth elevated PCWP;  c. 01/2014 Echo: EF 35-40%, mod LVH, mod HK, mildly dil LA, mod dil RA, PASP 59mmHg.   CKD (chronic kidney disease), stage III (HCC)    Dyslipidemia    GERD (gastroesophageal reflux disease)    Gout    history colon cancer 2005   colon surgery done   History of home oxygen therapy    uses oxygen nightly with cpap    Hyperkalemia    Hypertension    Insulin Dependent Diabetes mellitus    type 2   Myocardial infarction [facility]) 1997   PAD (peripheral artery disease) ([place])    Sleep apnea    Thrombocytopenia ([place]) 2011   dates back to 2011   UTI (lower urinary tract infection) 08/2012   E.coli    Past Surgical History:  Procedure Laterality Date   ABDOMINAL AORTOGRAM W/LOWER EXTREMITY Right 01/04/2020   Abdominal aortogram with right lower extremity  runoff   ABDOMINAL AORTOGRAM W/LOWER EXTREMITY Bilateral 01/04/2020   Procedure: ABDOMINAL AORTOGRAM W/LOWER EXTREMITY;  Surgeon: Elam Dutch, MD;  Location: [facility];  Service: Vascular;  Laterality: Bilateral;   AMPUTATION Right 02/25/2020   Procedure: RIGHT FIFTH TOE AMPUTATION;  Surgeon: Elam Dutch, MD;  Location: [facility];  Service: Vascular;  Laterality: Right;   AMPUTATION Right 10/06/2020   Procedure: RIGHT FOURTH TOE AMPUTATION WITH RESECTION OF METATARSAL HEAD;  Surgeon: Elam Dutch, MD;  Location: [facility];  Service: Vascular;  Laterality: Right;   APPENDECTOMY      BACK SURGERY     15 years ago lower   BIOPSY  03/10/2021   Procedure: BIOPSY;  Surgeon: Irene Shipper, MD;  Location: [facility] ENDOSCOPY;  Service: Endoscopy;;   CARDIAC CATHETERIZATION  2005   stenting of the vein graft to the posterior lateral per -- Dr. Martinique       CARDIAC CATHETERIZATION  2011   showed right coronary, totally occluded LAD and severe stenosis in both saphenous vein graft to the posterior lateral and posterior descending  -- stenting of the proximal portion of the SVG to the posterior lateral branch in 2/11   CHOLECYSTECTOMY     COLONOSCOPY     x several - colon polyps   CORONARY ARTERY BYPASS GRAFT  1997    (LIMA to LAD, SVG to PDA, SVG to PL)   ERCP N/A 10/17/2017    Irene Shipper, MD;  [facility] ENDOSCOPY; choledocholithiasis, biliary pancreatitis.  s/p sphincterotomy and stone extraction.     ESOPHAGOGASTRODUODENOSCOPY (EGD) WITH PROPOFOL N/A 09/21/2017   Procedure: ESOPHAGOGASTRODUODENOSCOPY (EGD) WITH PROPOFOL;  Surgeon: Doran Stabler, MD;  Location: [facility] ENDOSCOPY;  Service: Gastroenterology;  Laterality: N/A;   ESOPHAGOGASTRODUODENOSCOPY (EGD) WITH PROPOFOL N/A 03/10/2021   Procedure: ESOPHAGOGASTRODUODENOSCOPY (EGD) WITH PROPOFOL;  Surgeon: Irene Shipper, MD;  Location: [facility] ENDOSCOPY;  Service: Endoscopy;  Laterality: N/A;   EYE SURGERY     ioc for cataract   HERNIA REPAIR     LEFT AND RIGHT HEART CATHETERIZATION WITH CORONARY/GRAFT ANGIOGRAM  09/04/2012   Procedure: LEFT AND RIGHT HEART CATHETERIZATION WITH Beatrix Fetters;  Surgeon: Sherren Mocha, MD;  Location: [facility] CATH LAB;  patent grafts, mod 2ndary pulmonary HTN, elevated LV EDP & PCWP   LEFT HEART CATH AND CORS/GRAFTS ANGIOGRAPHY N/A 06/17/2017   Procedure: LEFT HEART CATH AND CORS/GRAFTS ANGIOGRAPHY;  Surgeon: Martinique, Peter M, MD;  Location: [facility];  Service: Cardiovascular;  Laterality: N/A;   mohs     x several   PARTIAL COLECTOMY     cancerous polyps   stent to heart  last done 5 to 6 yrs  ago   x 4   UPPER GI ENDOSCOPY      Social History   Socioeconomic History   Marital status: Single    Spouse name: Not on file   Number of children: 0   Years of education: Not on file   Highest education level: Not on file  Occupational History   Occupation: retired   ran a golf course  Tobacco Use   Smoking status: Former    Packs/day: 1.50    Years: 35.00    Pack years: 52.50    Types: Cigarettes    Quit date: 10/12/1983    Years since quitting: 37.5   Smokeless tobacco: Never  Vaping Use   Vaping Use: Never used  Substance and Sexual Activity   Alcohol use: Yes  Alcohol/week: 3.0 - 4.0 standard drinks    Types: 3 - 4 Standard drinks or equivalent per week   Drug use: No   Sexual activity: Not Currently    Birth control/protection: None  Other Topics Concern   Not on file  Social History Narrative   Lives in [place] by himself.  Active but does not routinely exercise.   Social Determinants of Health   Financial Resource Strain: Not on file  Food Insecurity: Not on file  Transportation Needs: No Transportation Needs   Lack of Transportation (Medical): No   Lack of Transportation (Non-Medical): No  Physical Activity: Not on file  Stress: Not on file  Social Connections: Not on file  Intimate Partner Violence: Not on file    No Known Allergies  Family History  Problem Relation Age of Onset   Coronary artery disease Father    Heart attack Father    Coronary artery disease Brother        1/2 brother    Kidney disease Sister    Cardiomyopathy Mother    Asthma Brother     Prior to Admission medications   Medication Sig Start Date End Date Taking? Authorizing Provider  ALPRAZolam (XANAX) 0.25 MG tablet Take 0.25 mg by mouth at bedtime.    [provider]  ALPRAZolam Duanne Moron) 0.25 MG tablet Take 1 tablet by mouth at bedtime. 04/08/21   [provider]  aspirin 81 MG EC tablet Take 81 mg by mouth at bedtime.    [provider]   atorvastatin (LIPITOR) 20 MG tablet Take 1 tablet (20 mg total) by mouth daily. 03/02/21 02/25/22  Martinique, Peter M, MD  carvedilol (COREG) 12.5 MG tablet Take 0.5 tablets by mouth 2 (two) times daily. 04/08/21   [provider]  Cholecalciferol (VITAMIN D) 50 MCG (2000 UT) tablet Take 2,000 Units by mouth daily.    [provider]  Cholecalciferol 50 MCG (2000 UT) TABS Take 1 tablet by mouth daily. 04/08/21   [provider]  colchicine 0.6 MG tablet Take 0.6-1.2 mg by mouth See admin instructions. Take 2 tablets (1.2 mg) by mouth at onset of gout attack, may take 1 tablet (0.6 mg) one hour later if still needed. Max 3 tablets in 3 days    [provider]  Febuxostat 80 MG TABS Take 40 mg by mouth daily.    [provider]  ferrous sulfate 325 (65 FE) MG tablet Take 325 mg by mouth every Monday, Wednesday, and Friday.    [provider]  fluticasone (FLONASE) 50 MCG/ACT nasal spray Place into both nostrils daily.    [provider]  fluticasone (FLONASE) 50 MCG/ACT nasal spray Place into the nose. 04/08/21   [provider]  Glycerin, Laxative, (GLYCERIN, ADULT, RE) Place rectally. 04/21/21   [provider]  hydrocortisone (ANUSOL-HC) 25 MG suppository Place rectally. 04/21/21   [provider]  insulin aspart protamine- aspart (NOVOLOG MIX 70/30) (70-30) 100 UNIT/ML injection Inject 30 Units into the skin 2 (two) times daily with a meal.    [provider]  latanoprost (XALATAN) 0.005 % ophthalmic solution Place 1 drop into both eyes at bedtime.    [provider]  Multiple Vitamin (MULTIVITAMIN WITH MINERALS) TABS tablet Take 1 tablet by mouth daily.    [provider]  nitroGLYCERIN (NITROSTAT) 0.4 MG SL tablet Place 1 tablet (0.4 mg total) under the tongue every 5 (five) minutes as needed for chest pain (up to 3 doses).  02/20/15   Martinique, Peter M, MD  oxyCODONE-acetaminophen  (PERCOCET/ROXICET) 5-325 MG tablet Take 1 tablet by mouth every 6 (six) hours as needed for severe pain. 03/31/21   Medina-Vargas, Monina C, NP  PRESCRIPTION MEDICATION Inhale into the lungs at bedtime. CPAP    [provider]  tamsulosin (FLOMAX) 0.4 MG CAPS capsule Take 0.4 mg by mouth at bedtime.    [provider]  torsemide (DEMADEX) 20 MG tablet Take 80 mg ( 4 tablets) in th morning and 40 mg ( 2 tablets)  in the evening. 05/01/21   Martinique, Peter M, MD    Physical Exam: Vitals:   05/05/21 1048 05/05/21 1100 05/05/21 1115 05/05/21 1235  BP: 136/82 (!) 148/79 (!) 145/82 136/72  Pulse: 76 74 77 75  Resp: 18 19 20 20   Temp:      TempSrc:      SpO2: 98% 97% 97% 97%     General:  Appears calm and comfortable and is in NAD Eyes:  PERRL, EOMI, normal lids, iris ENT:  grossly normal hearing, lips & tongue, mmm;  denture on top Neck:  no LAD, masses or thyromegaly; no carotid bruits Cardiovascular:  RRR, no m/r/g. Bilateral LE edema. Indurated to below the knee  Respiratory:   RLL crackles, occasional expiratory wheeze, otherwise clear.  Normal respiratory effort. Abdomen:  soft, NT, ND, NABS. Large ventral hernia  Back:   normal alignment, no CVAT Skin: raw, erythematous rash on RLE  Musculoskeletal:  grossly normal tone BUE/BLE, good ROM, no bony abnormality Lower extremity:    Limited foot exam with no ulcerations.  Thready distal pulses  Psychiatric:  grossly normal mood and affect, speech fluent and appropriate, AOx3 Neurologic:  CN 2-12 grossly intact, moves all extremities in coordinated fashion, sensation intact    Radiological Exams on Admission: Independently reviewed - see discussion in A/P where applicable  DG Chest 1 View  Result Date: 05/04/2021 CLINICAL DATA:  Shortness of breath and chest pain EXAM: CHEST  1 VIEW COMPARISON:  None. FINDINGS: Unchanged, enlarged cardiac silhouette with postsurgical changes of CABG. There is pulmonary vascular  congestion and continued bibasilar reticular and airspace opacities. Small right pleural effusions. No visible pneumothorax. No acute osseous abnormality. IMPRESSION: Cardiac with pulmonary vascular congestion and persistent bibasilar reticular and airspace opacities likely representing edema. Small right pleural effusion. Electronically Signed   By: Maurine Simmering   On: 05/04/2021 19:56    EKG: Independently reviewed.  NSR with rate 71, incomplete RBBB. Prolonged qt; nonspecific ST changes with no evidence of acute ischemia Prolonged qt new from last ekg.   Labs on Admission: I have personally reviewed the available labs and imaging studies at the time of the admission.  Pertinent labs:  Creatinine: 2.42 and BUN: 71 (baseline 2.5-3.0) Hgb: 10.0 (8.0-9.4) Platelets: 122 (45-64) Bnp: 2626.7   Assessment/Plan Principal Problem:   Acute exacerbation of combined systolic/diastolic CHF (congestive heart failure) (HCC) -increased dyspnea, edema and weight gain as well as elevated BNP and congestion on CXR despite increasing home oral diuretics.  -recent echo 04/2021: left ventricle EF: 25 to 30% with global hypokinesis, moderate dilatation of the left ventricle cavity.  Right ventricle systolic function with severe reduction.  RV cavity severely enlarged.  Elevated pulmonary systolic pressure. -dry weight appears to be around 208-210# He is at 225, not weighed today at hospital.  -strict I/O and daily weights -continue IV lasix will do 80mg  BID. Required this q8 hours on last admit, but will watch his  ouput and renal function and increase as needed. Follow potassium  -checking magnesium and placing on telemetry.  -can not medically manage his CHF due to concomitant medical problems. Beta blocker discontinued due AV block/bradycardia.  CKD stage 4 no ACEi.  -would follow tele and may be able to add back a lower dose beta blocker.   Active Problems:   Hypertension -well controlled. On no medication  as coreg held last hospitalization.     Arteriosclerotic coronary artery disease -continue statin and ASA -beta blocker discontinued last hospitalization due to AV block and bradycardia    Diabetes mellitus, type II, insulin dependent ([place]) -last a1c: 6.7 in 03/2021 -has 70/30 that he uses as a sliding scale, does not take daily.  -will hold home sliding scale and start accuchecks with SSI here    Dyslipidemia -continue statin  -12/21: LDL of 19, total chol: 72, hdl: 40    CKD (chronic kidney disease) stage 4, GFR 15-29 ml/min (HCC) -lower end of baseline -continue to monitor daily bmp with IV diuresis and volume overload     Iron deficiency anemia -stable, continue to monitor     Thrombocytopenia (HCC) -stable, tells me he is followed at [facility] by hematology. Chronic since 2011.  -no chemical DVT prophylaxis.  -monitor.     Prolonged QT interval -avoid QT prolonging drugs -checking magnesium -place on tele     OSA (obstructive sleep apnea) -cpap, but doesn't wear at home and declines to use here.     PAD (peripheral artery disease) ([place]) Followed by dr. Oneida Alar. Severe tibial disease  Does not look like he has cellulitis on RLE.  States it is unchanged from when he completed his keflex a few weeks ago. Will add on topical bacitracin and have wound care recommend wraps.    Gout -stable, continue home meds  -uric acid check 3 weeks ago and wnl at 6.7.   GERD with recent GI bleed due to gastric ulcers Continue protonix.   There is no height or weight on file to calculate BMI.   Level of care: Telemetry Cardiac DVT prophylaxis:  SCDs  Code Status:  DNR - confirmed with patient Family Communication: None present Disposition Plan:  The patient is from: home  Anticipated d/c is to: home  Meets inpatient status for IV diuresis in setting of decompensated CHF. Required a 5 day stay at last admit for diuresis. Hemodynamically stable at this time.  Admission status:  inpatient     Orma Flaming MD [facility]   How to contact the [facility] Attending or Consulting provider [place] or covering provider during after hours [place], for this patient?  Check the care team in [facility] and look for a) attending/consulting [facility] provider listed and b) the [facility] team listed Log into [email or web address] and use [place]'s universal password to access. If you do not have the password, please contact the hospital operator. Locate the [facility] provider you are looking for under [facility] and page to a number that you can be directly reached. If you still have difficulty reaching the provider, please page the [facility] (Director on Call) for the Hospitalists listed on amion for assistance.   05/05/2021, 1:20 PM

## 2021-05-05 NOTE — ED Notes (Signed)
Pt ambulated to bathroom with walker.

## 2021-05-05 NOTE — ED Provider Notes (Signed)
[facility] EMERGENCY DEPARTMENT Provider Note   CSN: [id number] Arrival date & time: 05/04/21  1656     History Chief Complaint  Patient presents with   chf exacerbation    Scott [name] is a 87 y.o. male.  Patient w hx chf, c/o increase in weight, and increase in bilateral leg swelling, and sob/doe in the past week. Symptoms acute onset, moderate, constant persistent. Had called his cardiologist w same, as felt possibly not urinating enough, and was told to increase his home diuretic use - despite that, symptoms persist/worse. Denies chest pain or discomfort. No fever or chills. No cough or uri symptoms. +orthopnea.   The history is provided by the patient.      Past Medical History:  Diagnosis Date   Acute biliary pancreatitis 10/2017   with choledocholithiasis.  ERCP, stone extraction performed   Arthritis    CAD (coronary artery disease)    a. 1997 CABGx3 (VG->RPDA, VG->PLV, LIMA->LAD);  b. 07/2004 PCI VG->PLV (3.5x16 Taxus DES, 3.5x12 Taxus DES); c. 11/2009 PCI: VG->PLV 3.5x12 Promus DES), VG->RPDA (3.5x15 Promus DES); d. 08/2012 Cath: patent grafts; e. 05/2013 Lexiscan MV: no ischemia/infarction, EF 44%.   Cataract    Chronic combined systolic and diastolic CHF (congestive heart failure) ([place]) 08/2012   a. EF 50-55% 11/2011; b. 08/2012 - EF 25-30%,  with mild RM, mod TR, mod RAE & LAE, moderate reduced RV systolic function and PASP of 38mmHg. - > cath showed patent grafts & Mod Puml HTN wth elevated PCWP;  c. 01/2014 Echo: EF 35-40%, mod LVH, mod HK, mildly dil LA, mod dil RA, PASP 63mmHg.   CKD (chronic kidney disease), stage III (HCC)    Dyslipidemia    GERD (gastroesophageal reflux disease)    Gout    history colon cancer 2005   colon surgery done   History of home oxygen therapy    uses oxygen nightly with cpap    Hyperkalemia    Hypertension    Insulin Dependent Diabetes mellitus    type 2   Myocardial infarction [facility]) 1997   PAD (peripheral  artery disease) ([place])    Sleep apnea    Thrombocytopenia ([place]) 2011   dates back to 2011   UTI (lower urinary tract infection) 08/2012   E.coli    Patient Active Problem List   Diagnosis Date Noted   Chronic low back pain 04/24/2021   History of coronary artery bypass graft 04/24/2021   Iron deficiency anemia 04/24/2021   Knee pain 04/24/2021   Thrombocytopenia ([place]) 04/24/2021   Ulcer of lower limb ([place]) 04/24/2021   Actinic keratosis 04/24/2021   Alcohol abuse 04/24/2021   Anemia in chronic kidney disease 04/24/2021   Basal cell carcinoma of face 04/24/2021   Cataract 04/24/2021   Cerebral aneurysm, nonruptured 04/24/2021   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, sequela ([place]) 04/24/2021   Cough 04/24/2021   Diarrhea, unspecified 04/24/2021   Diarrhea 04/24/2021   Encounter for immunization 04/24/2021   Clostridium difficile diarrhea 04/24/2021   Enthesopathy of ankle and tarsus 04/24/2021   Esophageal stricture 04/24/2021   Gout 04/24/2021   Hernia of other specified sites of abdominal cavity 04/24/2021   Hypertrophy of breast 04/24/2021   Hypovolemia 04/24/2021   Large liver 04/24/2021   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   Malignant neoplasm of colon ([place]) 04/24/2021   Need for assistance with personal care 04/24/2021   Neoplasm of uncertain behavior of eye 04/24/2021   Other and unspecified hyperlipidemia 04/24/2021  Other ill-defined and unknown causes of morbidity and mortality 31/49/7026   Other lichen planus 37/85/8850   Peripheral sensory neuropathy due to type 2 diabetes mellitus ([place]) 04/24/2021   Pain, unspecified 04/24/2021   Primary open-angle glaucoma, bilateral, moderate stage 04/24/2021   Prolonged QT interval 04/24/2021   Spinal stenosis, other than cervical 04/24/2021   Traumatic arthropathy 04/24/2021   Venous insufficiency (chronic) (peripheral) 04/24/2021   CKD (chronic kidney disease) stage 4, GFR 15-29 ml/min (HCC) 04/10/2021    Acute CHF (congestive heart failure) ([place]) 04/09/2021   CHF (congestive heart failure) ([place]) 04/09/2021   Cellulitis 04/03/2021   Pulmonary fibrosis ([place]) 03/20/2021   Acute on chronic blood loss anemia 03/10/2021   Acute kidney injury superimposed on CKD ([place]) 03/10/2021   Elevated troponin 03/10/2021   Shortness of breath 03/10/2021   Multiple gastric ulcers    Acute upper GI bleed 03/09/2021   Osteomyelitis of fifth toe of right foot ([place]) 02/25/2020   History of amputation of lesser toe ([place]) 02/25/2020   PAD (peripheral artery disease) ([place]) 01/03/2020   Choledocholithiasis    Pancreatitis 10/16/2017   Midsternal chest pain 05/21/2017   Dupuytren's contracture 02/25/2016   Nausea - without vomiting 01/01/2015   Low grade fever 01/01/2015   DOE (dyspnea on exertion) 01/01/2015   Unstable angina ([place]) 05/12/2013   Chronic combined systolic and diastolic heart failure ([place]) 05/12/2013   Personal history of other malignant neoplasm of skin 04/05/2013   CKD (chronic kidney disease) stage 3, GFR 30-59 ml/min (HCC) 09/01/2012   OSA (obstructive sleep apnea) 07/04/2012   Dyspnea 11/05/2011   Diabetes mellitus, type II, insulin dependent ([place]) 04/07/2011   Hypertension    Ischemic cardiomyopathy    Myocardial infarction [facility])    Dyslipidemia    Arteriosclerotic coronary artery disease    History of adenomatous polyp of colon 10/12/2003   Type 2 or unspecified type diabetes mellitus 10/11/1990    Past Surgical History:  Procedure Laterality Date   ABDOMINAL AORTOGRAM W/LOWER EXTREMITY Right 01/04/2020   Abdominal aortogram with right lower extremity runoff   ABDOMINAL AORTOGRAM W/LOWER EXTREMITY Bilateral 01/04/2020   Procedure: ABDOMINAL AORTOGRAM W/LOWER EXTREMITY;  Surgeon: Elam Dutch, MD;  Location: [place] CV LAB;  Service: Vascular;  Laterality: Bilateral;   AMPUTATION Right 02/25/2020   Procedure: RIGHT FIFTH TOE AMPUTATION;  Surgeon: Elam Dutch, MD;   Location: [place];  Service: Vascular;  Laterality: Right;   AMPUTATION Right 10/06/2020   Procedure: RIGHT FOURTH TOE AMPUTATION WITH RESECTION OF METATARSAL HEAD;  Surgeon: Elam Dutch, MD;  Location: [place] OR;  Service: Vascular;  Laterality: Right;   APPENDECTOMY     BACK SURGERY     15 years ago lower   BIOPSY  03/10/2021   Procedure: BIOPSY;  Surgeon: Irene Shipper, MD;  Location: [facility] ENDOSCOPY;  Service: Endoscopy;;   CARDIAC CATHETERIZATION  2005   stenting of the vein graft to the posterior lateral per -- Dr. Martinique       CARDIAC CATHETERIZATION  2011   showed right coronary, totally occluded LAD and severe stenosis in both saphenous vein graft to the posterior lateral and posterior descending  -- stenting of the proximal portion of the SVG to the posterior lateral branch in 2/11   CHOLECYSTECTOMY     COLONOSCOPY     x several - colon polyps   CORONARY ARTERY BYPASS GRAFT  1997    (LIMA to LAD, SVG to PDA, SVG to PL)   ERCP  N/A 10/17/2017    Irene Shipper, MD;  [facility] ENDOSCOPY; choledocholithiasis, biliary pancreatitis.  s/p sphincterotomy and stone extraction.     ESOPHAGOGASTRODUODENOSCOPY (EGD) WITH PROPOFOL N/A 09/21/2017   Procedure: ESOPHAGOGASTRODUODENOSCOPY (EGD) WITH PROPOFOL;  Surgeon: Doran Stabler, MD;  Location: [facility] ENDOSCOPY;  Service: Gastroenterology;  Laterality: N/A;   ESOPHAGOGASTRODUODENOSCOPY (EGD) WITH PROPOFOL N/A 03/10/2021   Procedure: ESOPHAGOGASTRODUODENOSCOPY (EGD) WITH PROPOFOL;  Surgeon: Irene Shipper, MD;  Location: [facility] ENDOSCOPY;  Service: Endoscopy;  Laterality: N/A;   EYE SURGERY     ioc for cataract   HERNIA REPAIR     LEFT AND RIGHT HEART CATHETERIZATION WITH CORONARY/GRAFT ANGIOGRAM  09/04/2012   Procedure: LEFT AND RIGHT HEART CATHETERIZATION WITH Beatrix Fetters;  Surgeon: Sherren Mocha, MD;  Location: [facility] CATH LAB;  patent grafts, mod 2ndary pulmonary HTN, elevated LV EDP & PCWP   LEFT HEART CATH AND CORS/GRAFTS ANGIOGRAPHY N/A  06/17/2017   Procedure: LEFT HEART CATH AND CORS/GRAFTS ANGIOGRAPHY;  Surgeon: Martinique, Peter M, MD;  Location: [place] CV LAB;  Service: Cardiovascular;  Laterality: N/A;   mohs     x several   PARTIAL COLECTOMY     cancerous polyps   stent to heart  last done 5 to 6 yrs ago   x 4   UPPER GI ENDOSCOPY         Family History  Problem Relation Age of Onset   Coronary artery disease Father    Heart attack Father    Coronary artery disease Brother        1/2 brother    Kidney disease Sister    Cardiomyopathy Mother    Asthma Brother     Social History   Tobacco Use   Smoking status: Former    Packs/day: 1.50    Years: 35.00    Pack years: 52.50    Types: Cigarettes    Quit date: 10/12/1983    Years since quitting: 37.5   Smokeless tobacco: Never  Vaping Use   Vaping Use: Never used  Substance Use Topics   Alcohol use: Yes    Alcohol/week: 3.0 - 4.0 standard drinks    Types: 3 - 4 Standard drinks or equivalent per week   Drug use: No    Home Medications Prior to Admission medications   Medication Sig Start Date End Date Taking? Authorizing Provider  ALPRAZolam (XANAX) 0.25 MG tablet Take 0.25 mg by mouth at bedtime.    [provider]  ALPRAZolam Duanne Moron) 0.25 MG tablet Take 1 tablet by mouth at bedtime. 04/08/21   [provider]  aspirin 81 MG EC tablet Take 81 mg by mouth at bedtime.    [provider]  atorvastatin (LIPITOR) 20 MG tablet Take 1 tablet (20 mg total) by mouth daily. 03/02/21 02/25/22  Martinique, Peter M, MD  carvedilol (COREG) 12.5 MG tablet Take 0.5 tablets by mouth 2 (two) times daily. 04/08/21   [provider]  Cholecalciferol (VITAMIN D) 50 MCG (2000 UT) tablet Take 2,000 Units by mouth daily.    [provider]  Cholecalciferol 50 MCG (2000 UT) TABS Take 1 tablet by mouth daily. 04/08/21   [provider]  colchicine 0.6 MG tablet Take 0.6-1.2 mg by mouth See admin instructions. Take 2 tablets (1.2  mg) by mouth at onset of gout attack, may take 1 tablet (0.6 mg) one hour later if still needed. Max 3 tablets in 3 days    [provider]  Febuxostat 80 MG  TABS Take 40 mg by mouth daily.    [provider]  ferrous sulfate 325 (65 FE) MG tablet Take 325 mg by mouth every Monday, Wednesday, and Friday.    [provider]  fluticasone (FLONASE) 50 MCG/ACT nasal spray Place into both nostrils daily.    [provider]  fluticasone (FLONASE) 50 MCG/ACT nasal spray Place into the nose. 04/08/21   [provider]  Glycerin, Laxative, (GLYCERIN, ADULT, RE) Place rectally. 04/21/21   [provider]  hydrocortisone (ANUSOL-HC) 25 MG suppository Place rectally. 04/21/21   [provider]  insulin aspart protamine- aspart (NOVOLOG MIX 70/30) (70-30) 100 UNIT/ML injection Inject 30 Units into the skin 2 (two) times daily with a meal.    [provider]  latanoprost (XALATAN) 0.005 % ophthalmic solution Place 1 drop into both eyes at bedtime.    [provider]  Multiple Vitamin (MULTIVITAMIN WITH MINERALS) TABS tablet Take 1 tablet by mouth daily.    [provider]  nitroGLYCERIN (NITROSTAT) 0.4 MG SL tablet Place 1 tablet (0.4 mg total) under the tongue every 5 (five) minutes as needed for chest pain (up to 3 doses). 02/20/15   Martinique, Peter M, MD  oxyCODONE-acetaminophen (PERCOCET/ROXICET) 5-325 MG tablet Take 1 tablet by mouth every 6 (six) hours as needed for severe pain. 03/31/21   Medina-Vargas, Monina C, NP  PRESCRIPTION MEDICATION Inhale into the lungs at bedtime. CPAP    [provider]  tamsulosin (FLOMAX) 0.4 MG CAPS capsule Take 0.4 mg by mouth at bedtime.    [provider]  torsemide (DEMADEX) 20 MG tablet Take 80 mg ( 4 tablets) in th morning and 40 mg ( 2 tablets)  in the evening. 05/01/21   Martinique, Peter M, MD    Allergies    Patient has no known allergies.  Review of Systems   Review  of Systems  Constitutional:  Negative for fever.  HENT:  Negative for sore throat.   Eyes:  Negative for redness.  Respiratory:  Positive for shortness of breath. Negative for cough.   Cardiovascular:  Positive for leg swelling. Negative for chest pain.  Gastrointestinal:  Negative for abdominal pain, diarrhea and vomiting.  Genitourinary:  Negative for dysuria and flank pain.  Musculoskeletal:  Negative for back pain and neck pain.  Skin:  Negative for rash.  Neurological:  Negative for headaches.  Hematological:  Does not bruise/bleed easily.  Psychiatric/Behavioral:  Negative for confusion.    Physical Exam Updated Vital Signs BP 136/82   Pulse 76   Temp (!) 97.4 F (36.3 C)   Resp 18   SpO2 98%   Physical Exam Vitals and nursing note reviewed.  Constitutional:      Appearance: Normal appearance. He is well-developed.  HENT:     Head: Atraumatic.     Nose: Nose normal.     Mouth/Throat:     Mouth: Mucous membranes are moist.     Pharynx: Oropharynx is clear.  Eyes:     General: No scleral icterus.    Conjunctiva/sclera: Conjunctivae normal.  Neck:     Trachea: No tracheal deviation.  Cardiovascular:     Rate and Rhythm: Normal rate and regular rhythm.     Pulses: Normal pulses.     Heart sounds: Normal heart sounds. No murmur heard.   No friction rub. No gallop.  Pulmonary:     Effort: Pulmonary effort is normal. No accessory muscle usage.     Comments: Rales in base.  Abdominal:     General: Bowel sounds are normal. There is no distension.     Palpations: Abdomen is soft.     Tenderness: There is no abdominal tenderness.  Genitourinary:    Comments: No cva tenderness. Musculoskeletal:        General: Swelling present.     Cervical back: Normal range of motion and neck supple. No rigidity.     Right lower leg: Edema present.     Left lower leg: Edema present.     Comments: Symmetric bilateral leg edema to groin.   Skin:    General: Skin is warm and dry.      Findings: No rash.  Neurological:     Mental Status: He is alert.     Comments: Alert, speech clear.   Psychiatric:        Mood and Affect: Mood normal.    ED Results / Procedures / Treatments   Labs (all labs ordered are listed, but only abnormal results are displayed) Results for orders placed or performed during the hospital encounter of 05/04/21  Comprehensive metabolic panel  Result Value Ref Range   Sodium 134 (L) 135 - 145 mmol/L   Potassium 4.4 3.5 - 5.1 mmol/L   Chloride 96 (L) 98 - 111 mmol/L   CO2 27 22 - 32 mmol/L   Glucose, Bld 169 (H) 70 - 99 mg/dL   BUN 71 (H) 8 - 23 mg/dL   Creatinine, Ser 2.42 (H) 0.61 - 1.24 mg/dL   Calcium 8.6 (L) 8.9 - 10.3 mg/dL   Total Protein 7.2 6.5 - 8.1 g/dL   Albumin 2.8 (L) 3.5 - 5.0 g/dL   AST 26 15 - 41 U/L   ALT 11 0 - 44 U/L   Alkaline Phosphatase 106 38 - 126 U/L   Total Bilirubin 1.0 0.3 - 1.2 mg/dL   GFR, Estimated 25 (L) >60 mL/min   Anion gap 11 5 - 15  CBC with Differential  Result Value Ref Range   WBC 7.3 4.0 - 10.5 K/uL   RBC 3.56 (L) 4.22 - 5.81 MIL/uL   Hemoglobin 10.0 (L) 13.0 - 17.0 g/dL   HCT 35.2 (L) 39.0 - 52.0 %   MCV 98.9 80.0 - 100.0 fL   MCH 28.1 26.0 - 34.0 pg   MCHC 28.4 (L) 30.0 - 36.0 g/dL   RDW 17.5 (H) 11.5 - 15.5 %   Platelets 122 (L) 150 - 400 K/uL   nRBC 0.0 0.0 - 0.2 %   Neutrophils Relative % 66 %   Neutro Abs 4.8 1.7 - 7.7 K/uL   Lymphocytes Relative 13 %   Lymphs Abs 1.0 0.7 - 4.0 K/uL   Monocytes Relative 17 %   Monocytes Absolute 1.2 (H) 0.1 - 1.0 K/uL   Eosinophils Relative 3 %   Eosinophils Absolute 0.2 0.0 - 0.5 K/uL   Basophils Relative 1 %   Basophils Absolute 0.1 0.0 - 0.1 K/uL   Immature Granulocytes 0 %   Abs Immature Granulocytes 0.03 0.00 - 0.07 K/uL  Brain natriuretic peptide  Result Value Ref Range   B Natriuretic Peptide 2,626.7 (H) 0.0 - 100.0 pg/mL   DG Chest 1 View  Result Date: 05/04/2021 CLINICAL DATA:  Shortness of breath and chest pain EXAM: CHEST  1  VIEW COMPARISON:  None. FINDINGS: Unchanged, enlarged cardiac silhouette with postsurgical changes of CABG. There is pulmonary vascular congestion and continued bibasilar reticular and airspace opacities. Small right pleural effusions. No visible pneumothorax. No  acute osseous abnormality. IMPRESSION: Cardiac with pulmonary vascular congestion and persistent bibasilar reticular and airspace opacities likely representing edema. Small right pleural effusion. Electronically Signed   By: Maurine Simmering   On: 05/04/2021 19:56   DG Chest 2 View  Result Date: 04/09/2021 CLINICAL DATA:  Shortness of breath. EXAM: CHEST - 2 VIEW COMPARISON:  10/15/2017. FINDINGS: Prior CABG. Cardiomegaly and pulmonary venous congestion. Mild bibasilar pulmonary infiltrates and or edema. Underlying chronic interstitial changes. Small bilateral pleural effusions. No pneumothorax. IMPRESSION: 1. Prior CABG. Cardiomegaly with pulmonary venous congestion. Mild bibasilar pulmonary infiltrates/edema. Small bilateral pleural effusions. 2.  Underlying chronic interstitial disease. Electronically Signed   By: Marcello Moores  Register   On: 04/09/2021 13:09   ECHOCARDIOGRAM COMPLETE  Result Date: 04/10/2021    ECHOCARDIOGRAM REPORT   Patient Name:   Scott [name] Date of Exam: 04/10/2021 Medical Rec #:  [id number]    Height:       72.0 in Accession #:    [id number]   Weight:       213.9 lb Date of Birth:  09/03/1933    BSA:          2.192 m Patient Age:    81 years     BP:           115/94 mmHg Patient Gender: M            HR:           54 bpm. Exam Location:  Inpatient Procedure: 2D Echo Indications:    acute systolic chf  History:        Patient has prior history of Echocardiogram examinations, most                 recent 02/04/2020. Cardiomyopathy, Prior CABG,                 Signs/Symptoms:Shortness of Breath; Risk Factors:Hypertension,                 Dyslipidemia, Diabetes and Sleep Apnea.  Sonographer:    Johny Chess Referring Phys: [id number] [place]  1. Left ventricular ejection fraction, by estimation, is 25 to 30%. The left ventricle has severely decreased function. The left ventricle demonstrates global hypokinesis. The left ventricular internal cavity size was moderately dilated. Left ventricular diastolic parameters are consistent with Grade II diastolic dysfunction (pseudonormalization). Elevated left ventricular end-diastolic pressure.  2. Right ventricular systolic function is severely reduced. The right ventricular size is severely enlarged. There is severely elevated pulmonary artery systolic pressure.  3. Left atrial size was severely dilated.  4. Right atrial size was moderately dilated.  5. The mitral valve is degenerative. Mild mitral valve regurgitation.  6. Tricuspid valve regurgitation is mild to moderate.  7. The aortic valve is tricuspid. There is mild calcification of the aortic valve. There is mild thickening of the aortic valve. Aortic valve regurgitation is mild. FINDINGS  Left Ventricle: Left ventricular ejection fraction, by estimation, is 25 to 30%. The left ventricle has severely decreased function. The left ventricle demonstrates global hypokinesis. The left ventricular internal cavity size was moderately dilated. There is borderline left ventricular hypertrophy. Left ventricular diastolic parameters are consistent with Grade II diastolic dysfunction (pseudonormalization). Elevated left ventricular end-diastolic pressure. Right Ventricle: The right ventricular size is severely enlarged. Right vetricular wall thickness was not well visualized. Right ventricular systolic function is severely reduced. There is severely elevated pulmonary artery systolic pressure. The tricuspid regurgitant velocity is 3.54 m/s, and with  an assumed right atrial pressure of 10 mmHg, the estimated right ventricular systolic pressure is 97.6 mmHg. Left Atrium: Left atrial size was severely dilated. Right Atrium: Right atrial size was  moderately dilated. Pericardium: There is no evidence of pericardial effusion. Mitral Valve: The mitral valve is degenerative in appearance. There is moderate thickening of the mitral valve leaflet(s). There is moderate calcification of the mitral valve leaflet(s). Mild mitral valve regurgitation. Tricuspid Valve: The tricuspid valve is grossly normal. Tricuspid valve regurgitation is mild to moderate. Aortic Valve: The aortic valve is tricuspid. There is mild calcification of the aortic valve. There is mild thickening of the aortic valve. Aortic valve regurgitation is mild. Aortic regurgitation PHT measures 524 msec. Pulmonic Valve: The pulmonic valve was grossly normal. Pulmonic valve regurgitation is mild to moderate. Aorta: The aortic root and ascending aorta are structurally normal, with no evidence of dilitation. IAS/Shunts: The atrial septum is grossly normal.  LEFT VENTRICLE PLAX 2D LVIDd:         5.90 cm      Diastology LVIDs:         4.80 cm      LV e' medial:    3.81 cm/s LV PW:         1.10 cm      LV E/e' medial:  34.4 LV IVS:        1.10 cm      LV e' lateral:   6.53 cm/s LVOT diam:     2.30 cm      LV E/e' lateral: 20.1 LV SV:         74 LV SV Index:   34 LVOT Area:     4.15 cm  LV Volumes (MOD) LV vol d, MOD A2C: 182.0 ml LV vol d, MOD A4C: 161.0 ml LV vol s, MOD A2C: 123.0 ml LV vol s, MOD A4C: 98.0 ml LV SV MOD A2C:     59.0 ml LV SV MOD A4C:     161.0 ml LV SV MOD BP:      65.1 ml RIGHT VENTRICLE            IVC RV S prime:     4.43 cm/s  IVC diam: 2.70 cm TAPSE (M-mode): 0.9 cm LEFT ATRIUM              Index       RIGHT ATRIUM           Index LA diam:        5.60 cm  2.55 cm/m  RA Area:     24.40 cm LA Vol (A2C):   106.0 ml 48.35 ml/m RA Volume:   71.00 ml  32.39 ml/m LA Vol (A4C):   99.5 ml  45.39 ml/m LA Biplane Vol: 108.0 ml 49.26 ml/m  AORTIC VALVE LVOT Vmax:   78.50 cm/s LVOT Vmean:  48.700 cm/s LVOT VTI:    0.178 m AI PHT:      524 msec  AORTA Ao Root diam: 3.50 cm Ao Asc diam:  3.80  cm MITRAL VALVE                 TRICUSPID VALVE MV Area (PHT): 4.10 cm      TR Peak grad:   50.1 mmHg MV Decel Time: 185 msec      TR Vmax:        354.00 cm/s MR Peak grad:    74.3 mmHg MR Mean grad:    50.0 mmHg  SHUNTS MR Vmax:         431.00 cm/s Systemic VTI:  0.18 m MR Vmean:        337.0 cm/s  Systemic Diam: 2.30 cm MR PISA:         1.01 cm MR PISA Eff ROA: 9 mm MR PISA Radius:  0.40 cm MV E velocity: 131.00 cm/s MV A velocity: 79.80 cm/s MV E/A ratio:  1.64 Mertie Moores MD Electronically signed by Mertie Moores MD Signature Date/Time: 04/10/2021/3:26:32 PM    Final      EKG EKG Interpretation  Date/Time:  Monday May 04 2021 18:52:15 EDT Ventricular Rate:  71 PR Interval:    QRS Duration: 118 QT Interval:  442 QTC Calculation: 480 R Axis:   111 Text Interpretation: Sinus rhythm Right axis deviation Incomplete right bundle branch block Possible Right ventricular hypertrophy ST & T wave abnormality, consider inferior ischemia Prolonged QT Abnormal ECG Confirmed by Ripley Fraise [phone]) on 05/05/2021 9:10:22 AM  Radiology DG Chest 1 View  Result Date: 05/04/2021 CLINICAL DATA:  Shortness of breath and chest pain EXAM: CHEST  1 VIEW COMPARISON:  None. FINDINGS: Unchanged, enlarged cardiac silhouette with postsurgical changes of CABG. There is pulmonary vascular congestion and continued bibasilar reticular and airspace opacities. Small right pleural effusions. No visible pneumothorax. No acute osseous abnormality. IMPRESSION: Cardiac with pulmonary vascular congestion and persistent bibasilar reticular and airspace opacities likely representing edema. Small right pleural effusion. Electronically Signed   By: Maurine Simmering   On: 05/04/2021 19:56    Procedures Procedures   Medications Ordered in ED Medications - No data to display  ED Course  I have reviewed the triage vital signs and the nursing notes.  Pertinent labs & imaging results that were available during my care of the  patient were reviewed by me and considered in my medical decision making (see chart for details).    MDM Rules/Calculators/A&P                           Iv ns. Stat labs. Ecg. Pcxr.   Reviewed nursing notes and prior charts for additional history.   Labs reviewed/interpreted by me - bnp high.  Lasix iv.   CXR reviewed/interpreted by me  - vascular congestion/chf.  Pcp is at [place]  - unassigned medicine consulted for admission.   Final Clinical Impression(s) / ED Diagnoses Final diagnoses:  CHF (congestive heart failure) ([place])    Rx / DC Orders ED Discharge Orders     None        Lajean Saver, MD 05/05/21 1108

## 2021-05-05 NOTE — Consult Note (Signed)
[place] Nurse Consult Note: Patient receiving care in [facility] Reason for Consult: RLE reddness and rash Wound type: partial thickness skin loss of the RLE that is pink and moist. History of PAD.  Pressure Injury POA: NA Drainage (amount, consistency, odor) None Periwound: Dry Dressing procedure/placement/frequency: Previous order has been placed by MD for Mupuricin ointment to be applied to the raw areas of the RLE. This order will be continued but the ointment has not been received to the patient room. I placed a petrolatum based dressing over these areas and wrapped the leg with a Kerlix. Once the ointment has been received the bedside RN may replace the petrolatum dressing with Mupuricin ointment, cover with a non-adherent gauze and wrap with Kerlix. Apply the ointment twice daily.  The left LE is very dry, so I applied a generous coat of Sween moisturizing lotion to this leg.    Monitor the wound area(s) for worsening of condition such as: Signs/symptoms of infection, increase in size, development of or worsening of odor, development of pain, or increased pain at the affected locations.   Notify the medical team if any of these develop.  Thank you for the consult. [place] nurse will not follow at this time.   Please re-consult the [place] team if needed.  Cathlean Marseilles Tamala Julian, MSN, RN, [place], Lysle Pearl, [facility] Wound Treatment Associate Pager [phone]

## 2021-05-05 NOTE — ED Notes (Signed)
Pt placed on 2L [place] when he dozes off he desat to 89%

## 2021-05-05 NOTE — ED Notes (Signed)
Report given to [facility]

## 2021-05-05 NOTE — Progress Notes (Signed)
Patient refused CPAP for the night  

## 2021-05-06 ENCOUNTER — Ambulatory Visit: Payer: No Typology Code available for payment source | Admitting: Cardiology

## 2021-05-06 DIAGNOSIS — N184 Chronic kidney disease, stage 4 (severe): Secondary | ICD-10-CM | POA: Diagnosis not present

## 2021-05-06 DIAGNOSIS — I5043 Acute on chronic combined systolic (congestive) and diastolic (congestive) heart failure: Secondary | ICD-10-CM

## 2021-05-06 DIAGNOSIS — I2510 Atherosclerotic heart disease of native coronary artery without angina pectoris: Secondary | ICD-10-CM

## 2021-05-06 LAB — GLUCOSE, CAPILLARY
Glucose-Capillary: 163 mg/dL — ABNORMAL HIGH (ref 70–99)
Glucose-Capillary: 219 mg/dL — ABNORMAL HIGH (ref 70–99)
Glucose-Capillary: 147 mg/dL — ABNORMAL HIGH (ref 70–99)

## 2021-05-06 LAB — CBC
HCT: 31.4 % — ABNORMAL LOW (ref 39.0–52.0)
Hemoglobin: 9.4 g/dL — ABNORMAL LOW (ref 13.0–17.0)
MCH: 28.8 pg (ref 26.0–34.0)
MCHC: 29.9 g/dL — ABNORMAL LOW (ref 30.0–36.0)
MCV: 96.3 fL (ref 80.0–100.0)
Platelets: 125 10*3/uL — ABNORMAL LOW (ref 150–400)
RBC: 3.26 MIL/uL — ABNORMAL LOW (ref 4.22–5.81)
RDW: 17.6 % — ABNORMAL HIGH (ref 11.5–15.5)
WBC: 5.5 10*3/uL (ref 4.0–10.5)
nRBC: 0 % (ref 0.0–0.2)

## 2021-05-06 LAB — BASIC METABOLIC PANEL
Anion gap: 8 (ref 5–15)
BUN: 69 mg/dL — ABNORMAL HIGH (ref 8–23)
CO2: 29 mmol/L (ref 22–32)
Calcium: 8.6 mg/dL — ABNORMAL LOW (ref 8.9–10.3)
Chloride: 101 mmol/L (ref 98–111)
Creatinine, Ser: 2.09 mg/dL — ABNORMAL HIGH (ref 0.61–1.24)
GFR, Estimated: 30 mL/min — ABNORMAL LOW (ref >60)
Glucose, Bld: 141 mg/dL — ABNORMAL HIGH (ref 70–99)
Potassium: 4.4 mmol/L (ref 3.5–5.1)
Sodium: 138 mmol/L (ref 135–145)

## 2021-05-06 MED ORDER — FUROSEMIDE 10 MG/ML IJ SOLN
80.0000 mg | Freq: Three times a day (TID) | INTRAMUSCULAR | Status: DC
  Administered 2021-05-06 – 2021-05-09 (×11): 80 mg via INTRAVENOUS
  Filled 2021-05-06 (×12): qty 8

## 2021-05-06 MED ORDER — OXYCODONE HCL 5 MG PO TABS
5.0000 mg | ORAL_TABLET | Freq: Four times a day (QID) | ORAL | Status: DC | PRN
  Administered 2021-05-06 – 2021-05-19 (×30): 5 mg via ORAL
  Filled 2021-05-06 (×31): qty 1

## 2021-05-06 MED ORDER — COLCHICINE 0.3 MG HALF TABLET
0.3000 mg | ORAL_TABLET | Freq: Every day | ORAL | Status: DC
  Administered 2021-05-08 – 2021-05-20 (×13): 0.3 mg via ORAL
  Filled 2021-05-06 (×15): qty 1

## 2021-05-06 NOTE — Consult Note (Addendum)
Cardiology Consultation:   Patient ID: Scott [name] MRN: [id number]; DOB: December 25, 1932  Admit date: 05/04/2021 Date of Consult: 05/06/2021  PCP:  Reubin Milan, MD   [facility] Providers Cardiologist:  Peter Martinique, MD    Patient Profile:   Scott [name] is a 87 y.o. male with a hx of CAD, chronic systolic and diastolic heart failure, right heart failure, pulmonary hypertension, HTN, HLD, IDDM, IDA, CKD stage IV, OSA on CPAP, and bradycardia who is being seen 05/06/2021 for the evaluation of CHF at the request of Dr. Tyrell Antonio.  History of Present Illness:   Mr. Boozer with a history of CAD status post CABG (LIMA to LAD, SVG to PLA, SVG to PDA), hypertension, hyperlipidemia, chronic combined systolic and diastolic heart failure, insulin-dependent diabetes, CKD stage IV, gout, OSA on CPAP, and dietary indiscretion.  Echocardiogram in December 2013 with EF 25 to 30%.  Heart catheterization in 2018 showed native disease, and 3 out of 3 patent grafts (prior stents in SVGs patent).  EF improved to 35 to 40% by echo in April 2015, moderate LVH, mild MR.  He has battled CHF exacerbation with adjustments in his diuretic.  Echo 02/04/20 with EF 35%, severe LV dysfunction, moderate pulmonary HTN, mild to moderate MR.   Metolazone 2.5 mg twice weekly was started in May 2021. He was last seen in clinic with Dr. Martinique on 03/02/21. [facility] and was told he was dehydrated and needed to go the ER. BUN was 123 and sCr 3.04, K 5. Dr. Martinique held his diuretic for 3 days and resumed 40 mg lasix BID. Losartan was also discontinued due to renal function.   He was hospitalized June 2022 for CHF exacerbation, and right leg cellulitis, cardiology was not consulted. Symptoms were refractory to increases in lasix at home. He was diuresed with IV lasix. First degree heart block and RBBB --> coreg was discontinued. Discharge weight was 210 lbs. Echo was repeated that admission and showed  a decline in EF to 25-30%, grade II DD, and severe RV failure (progression since 2021 when estimated as mild RV dysfunction).   He called our office 04/29/21 with complaints of a 12 lb weight gain. Dr. Martinique recommended increasing torsemide to 80 / 40 mg daily and was scheduled to see Dr. Martinique today, but [facility] decided he could not wait that long and brought him to the ER. On presentation, he reported a 15 lbs weight gain, but denies lower extremity swelling, but does have abdominal and scrotal swelling. He was taking 20 mg torsemide BID after last discharged and recently increased to 40 mg BID 1 week ago. On 05/04/21, he did increase torsemide to 80 mg qAM and 40 mg qPM.   BNP 2627 (was 2200 3 weeks ago) CXR with congestion sCr 2.09, CO2 29, BUN 69, K 4.4 Hb 9.4 - near baseline  He was started on 80 mg IV lasix BID. Of note, last hospitalization required 80 mg IV lasix TID.   Cardiology was consulted for further management. He reports feeling well for about 4 days post discharge and then started having steady weight gain despite escalation in his home torsemide dose. He is visibly short of breath and volume up on exam. Denies chest pain and palpitations.    Past Medical History:  Diagnosis Date   Acute biliary pancreatitis 10/2017   with choledocholithiasis.  ERCP, stone extraction performed   Arthritis    CAD (coronary artery disease)  a. 1997 CABGx3 (VG->RPDA, VG->PLV, LIMA->LAD);  b. 07/2004 PCI VG->PLV (3.5x16 Taxus DES, 3.5x12 Taxus DES); c. 11/2009 PCI: VG->PLV 3.5x12 Promus DES), VG->RPDA (3.5x15 Promus DES); d. 08/2012 Cath: patent grafts; e. 05/2013 Lexiscan MV: no ischemia/infarction, EF 44%.   Cataract    Chronic combined systolic and diastolic CHF (congestive heart failure) ([place]) 08/2012   a. EF 50-55% 11/2011; b. 08/2012 - EF 25-30%,  with mild RM, mod TR, mod RAE & LAE, moderate reduced RV systolic function and PASP of 24mmHg. - > cath showed patent grafts & Mod Puml HTN wth  elevated PCWP;  c. 01/2014 Echo: EF 35-40%, mod LVH, mod HK, mildly dil LA, mod dil RA, PASP 56mmHg.   CKD (chronic kidney disease), stage III (HCC)    Dyslipidemia    GERD (gastroesophageal reflux disease)    Gout    history colon cancer 2005   colon surgery done   History of home oxygen therapy    uses oxygen nightly with cpap    Hyperkalemia    Hypertension    Insulin Dependent Diabetes mellitus    type 2   Myocardial infarction [facility]) 1997   PAD (peripheral artery disease) ([place])    Sleep apnea    Thrombocytopenia ([place]) 2011   dates back to 2011   UTI (lower urinary tract infection) 08/2012   E.coli    Past Surgical History:  Procedure Laterality Date   ABDOMINAL AORTOGRAM W/LOWER EXTREMITY Right 01/04/2020   Abdominal aortogram with right lower extremity runoff   ABDOMINAL AORTOGRAM W/LOWER EXTREMITY Bilateral 01/04/2020   Procedure: ABDOMINAL AORTOGRAM W/LOWER EXTREMITY;  Surgeon: Elam Dutch, MD;  Location: [place] CV LAB;  Service: Vascular;  Laterality: Bilateral;   AMPUTATION Right 02/25/2020   Procedure: RIGHT FIFTH TOE AMPUTATION;  Surgeon: Elam Dutch, MD;  Location: [place];  Service: Vascular;  Laterality: Right;   AMPUTATION Right 10/06/2020   Procedure: RIGHT FOURTH TOE AMPUTATION WITH RESECTION OF METATARSAL HEAD;  Surgeon: Elam Dutch, MD;  Location: [place] OR;  Service: Vascular;  Laterality: Right;   APPENDECTOMY     BACK SURGERY     15 years ago lower   BIOPSY  03/10/2021   Procedure: BIOPSY;  Surgeon: Irene Shipper, MD;  Location: [facility] ENDOSCOPY;  Service: Endoscopy;;   CARDIAC CATHETERIZATION  2005   stenting of the vein graft to the posterior lateral per -- Dr. Martinique       CARDIAC CATHETERIZATION  2011   showed right coronary, totally occluded LAD and severe stenosis in both saphenous vein graft to the posterior lateral and posterior descending  -- stenting of the proximal portion of the SVG to the posterior lateral branch in 2/11    CHOLECYSTECTOMY     COLONOSCOPY     x several - colon polyps   CORONARY ARTERY BYPASS GRAFT  1997    (LIMA to LAD, SVG to PDA, SVG to PL)   ERCP N/A 10/17/2017    Irene Shipper, MD;  [facility] ENDOSCOPY; choledocholithiasis, biliary pancreatitis.  s/p sphincterotomy and stone extraction.     ESOPHAGOGASTRODUODENOSCOPY (EGD) WITH PROPOFOL N/A 09/21/2017   Procedure: ESOPHAGOGASTRODUODENOSCOPY (EGD) WITH PROPOFOL;  Surgeon: Doran Stabler, MD;  Location: [facility] ENDOSCOPY;  Service: Gastroenterology;  Laterality: N/A;   ESOPHAGOGASTRODUODENOSCOPY (EGD) WITH PROPOFOL N/A 03/10/2021   Procedure: ESOPHAGOGASTRODUODENOSCOPY (EGD) WITH PROPOFOL;  Surgeon: Irene Shipper, MD;  Location: [facility] ENDOSCOPY;  Service: Endoscopy;  Laterality: N/A;   EYE SURGERY     ioc for  cataract   HERNIA REPAIR     LEFT AND RIGHT HEART CATHETERIZATION WITH CORONARY/GRAFT ANGIOGRAM  09/04/2012   Procedure: LEFT AND RIGHT HEART CATHETERIZATION WITH Beatrix Fetters;  Surgeon: Sherren Mocha, MD;  Location: [facility] CATH LAB;  patent grafts, mod 2ndary pulmonary HTN, elevated LV EDP & PCWP   LEFT HEART CATH AND CORS/GRAFTS ANGIOGRAPHY N/A 06/17/2017   Procedure: LEFT HEART CATH AND CORS/GRAFTS ANGIOGRAPHY;  Surgeon: Martinique, Peter M, MD;  Location: [place] CV LAB;  Service: Cardiovascular;  Laterality: N/A;   mohs     x several   PARTIAL COLECTOMY     cancerous polyps   stent to heart  last done 5 to 6 yrs ago   x 4   UPPER GI ENDOSCOPY       Home Medications:  Prior to Admission medications   Medication Sig Start Date End Date Taking? Authorizing Provider  ALPRAZolam (XANAX) 0.25 MG tablet Take 0.25 mg by mouth at bedtime.   Yes [provider]  aspirin 81 MG EC tablet Take 81 mg by mouth at bedtime.   Yes [provider]  atorvastatin (LIPITOR) 20 MG tablet Take 1 tablet (20 mg total) by mouth daily. 03/02/21 02/25/22 Yes Martinique, Peter M, MD  carvedilol (COREG) 12.5 MG tablet Take 6.25 mg by mouth 2 (two)  times daily. 04/08/21  Yes [provider]  Cholecalciferol (VITAMIN D) 50 MCG (2000 UT) tablet Take 2,000 Units by mouth every Monday, Wednesday, and Friday at 8 PM.   Yes [provider]  colchicine 0.6 MG tablet Take 0.6-1.2 mg by mouth See admin instructions. Take 2 tablets (1.2 mg) by mouth at onset of gout attack, may take 1 tablet (0.6 mg) one hour later if still needed. Max 3 tablets in 3 days   Yes [provider]  docusate sodium (COLACE) 100 MG capsule Take 100 mg by mouth daily as needed for mild constipation.   Yes [provider]  Febuxostat 80 MG TABS Take 40 mg by mouth daily.   Yes [provider]  ferrous sulfate 325 (65 FE) MG tablet Take 325 mg by mouth every Monday, Wednesday, and Friday.   Yes [provider]  fluticasone (FLONASE) 50 MCG/ACT nasal spray Place 1 spray into both nostrils daily as needed for allergies.   Yes [provider]  insulin aspart protamine- aspart (NOVOLOG MIX 70/30) (70-30) 100 UNIT/ML injection Inject 30 Units into the skin daily as needed (as per blood sugar level).   Yes [provider]  latanoprost (XALATAN) 0.005 % ophthalmic solution Place 1 drop into both eyes at bedtime.   Yes [provider]  Multiple Vitamin (MULTIVITAMIN WITH MINERALS) TABS tablet Take 1 tablet by mouth daily.   Yes [provider]  nitroGLYCERIN (NITROSTAT) 0.4 MG SL tablet Place 1 tablet (0.4 mg total) under the tongue every 5 (five) minutes as needed for chest pain (up to 3 doses). 02/20/15  Yes Martinique, Peter M, MD  oxyCODONE-acetaminophen (PERCOCET/ROXICET) 5-325 MG tablet Take 1 tablet by mouth every 6 (six) hours as needed for severe pain. 03/31/21  Yes Medina-Vargas, Monina C, NP  PRESCRIPTION MEDICATION Inhale into the lungs at bedtime. CPAP   Yes [provider]  tamsulosin (FLOMAX) 0.4 MG CAPS capsule Take 0.4 mg by mouth at bedtime.   Yes [provider]   torsemide (DEMADEX) 20 MG tablet Take 80 mg ( 4 tablets) in th morning and 40 mg ( 2 tablets)  in the evening.  Patient taking differently: Take 40-60 mg by mouth See admin instructions. Take 60 mg in th morning and 40 mg in the evening. 05/01/21  Yes Martinique, Peter M, MD    Inpatient Medications: Scheduled Meds:  ALPRAZolam  0.25 mg Oral QHS   aspirin  81 mg Oral QHS   atorvastatin  20 mg Oral Daily   cholecalciferol  2,000 Units Oral Q M,W,F-2000   [START ON 05/07/2021] colchicine  0.3 mg Oral Daily   febuxostat  40 mg Oral Daily   ferrous sulfate  325 mg Oral Q M,W,F   furosemide  80 mg Intravenous BID   insulin aspart  0-9 Units Subcutaneous TID WC   latanoprost  1 drop Both Eyes QHS   multivitamin with minerals  1 tablet Oral Daily   mupirocin ointment   Nasal BID   sodium chloride flush  3 mL Intravenous Q12H   tamsulosin  0.4 mg Oral QHS   Continuous Infusions:  sodium chloride     PRN Meds: sodium chloride, acetaminophen **OR** acetaminophen, docusate sodium, fluticasone, nitroGLYCERIN, oxyCODONE, sodium chloride flush  Allergies:   No Known Allergies  Social History:   Social History   Socioeconomic History   Marital status: Single    Spouse name: Not on file   Number of children: 0   Years of education: Not on file   Highest education level: Not on file  Occupational History   Occupation: retired   ran a golf course  Tobacco Use   Smoking status: Former    Packs/day: 1.50    Years: 35.00    Pack years: 52.50    Types: Cigarettes    Quit date: 10/12/1983    Years since quitting: 37.5   Smokeless tobacco: Never  Vaping Use   Vaping Use: Never used  Substance and Sexual Activity   Alcohol use: Yes    Alcohol/week: 3.0 - 4.0 standard drinks    Types: 3 - 4 Standard drinks or equivalent per week   Drug use: No   Sexual activity: Not Currently    Birth control/protection: None  Other Topics Concern   Not on file  Social History Narrative   Lives in [place] by  himself.  Active but does not routinely exercise.   Social Determinants of Health   Financial Resource Strain: Not on file  Food Insecurity: Not on file  Transportation Needs: No Transportation Needs   Lack of Transportation (Medical): No   Lack of Transportation (Non-Medical): No  Physical Activity: Not on file  Stress: Not on file  Social Connections: Not on file  Intimate Partner Violence: Not on file    Family History:    Family History  Problem Relation Age of Onset   Coronary artery disease Father    Heart attack Father    Coronary artery disease Brother        1/2 brother    Kidney disease Sister    Cardiomyopathy Mother    Asthma Brother      ROS:  Please see the history of present illness.   All other ROS reviewed and negative.     Physical Exam/Data:   Vitals:   05/05/21 2347 05/06/21 0332 05/06/21 0755 05/06/21 1131  BP: 112/73 121/81 (!) 122/92 122/82  Pulse: 72 69 70 78  Resp: 17 17    Temp: (!) 97.4 F (36.3 C) 97.6 F (36.4 C) 98.4 F (36.9 C)   TempSrc: Oral Oral Oral   SpO2: 100% 97% 99% 100%  Weight:  100.5 kg    Height:        Intake/Output Summary (Last 24 hours) at 05/06/2021 1533 Last data filed at 05/06/2021 1316 Gross per 24 hour  Intake 1135 ml  Output 1325 ml  Net -190 ml   Last 3 Weights 05/06/2021 05/05/2021 04/14/2021  Weight (lbs) 221 lb 9.6 oz 221 lb 9.6 oz 209 lb  Weight (kg) 100.517 kg 100.517 kg 94.802 kg     Body mass index is 30.05 kg/m.  General:  elderly male in NAD Neck: + JVD Cardiac:  normal S1, S2; RRR; soft murmur  Lungs:  crackles in bases Abd: soft, nontender, no hepatomegaly  Ext: weeping edema on right leg, wrapped, edema on left leg with wound involving 5th toe Musculoskeletal:  No deformities, BUE and BLE strength normal and equal Skin: warm and dry  Neuro:  CNs 2-12 intact, no focal abnormalities noted Psych:  Normal affect   EKG:  The EKG was personally reviewed and demonstrates:  sinus HR 71, RBBB,  TWI inferior and lateral leads Telemetry:  Telemetry was personally reviewed and demonstrates:  appears to be sinus with first degree heart block in the 80s and PVCs  Relevant CV Studies:  Echo 04/10/21: 1. Left ventricular ejection fraction, by estimation, is 25 to 30%. The  left ventricle has severely decreased function. The left ventricle  demonstrates global hypokinesis. The left ventricular internal cavity size  was moderately dilated. Left  ventricular diastolic parameters are consistent with Grade II diastolic  dysfunction (pseudonormalization). Elevated left ventricular end-diastolic  pressure.   2. Right ventricular systolic function is severely reduced. The right  ventricular size is severely enlarged. There is severely elevated  pulmonary artery systolic pressure.   3. Left atrial size was severely dilated.   4. Right atrial size was moderately dilated.   5. The mitral valve is degenerative. Mild mitral valve regurgitation.   6. Tricuspid valve regurgitation is mild to moderate.   7. The aortic valve is tricuspid. There is mild calcification of the  aortic valve. There is mild thickening of the aortic valve. Aortic valve  regurgitation is mild.   Laboratory Data:  High Sensitivity Troponin:   Recent Labs  Lab 04/09/21 1208 04/09/21 1408  TROPONINIHS 35* 33*     Chemistry Recent Labs  Lab 05/04/21 1931 05/05/21 1328 05/06/21 0215  NA 134* 137 138  K 4.4 4.0 4.4  CL 96* 100 101  CO2 27 27 29   GLUCOSE 169* 128* 141*  BUN 71* 69* 69*  CREATININE 2.42* 2.13* 2.09*  CALCIUM 8.6* 8.5* 8.6*  GFRNONAA 25* 30* 30*  ANIONGAP 11 10 8     Recent Labs  Lab 05/04/21 1931  PROT 7.2  ALBUMIN 2.8*  AST 26  ALT 11  ALKPHOS 106  BILITOT 1.0   Hematology Recent Labs  Lab 05/04/21 1931 05/06/21 0215  WBC 7.3 5.5  RBC 3.56* 3.26*  HGB 10.0* 9.4*  HCT 35.2* 31.4*  MCV 98.9 96.3  MCH 28.1 28.8  MCHC 28.4* 29.9*  RDW 17.5* 17.6*  PLT 122* 125*   BNP Recent  Labs  Lab 05/04/21 1931  BNP 2,626.7*    DDimer No results for input(s): DDIMER in the last 168 hours.   Radiology/Studies:  DG Chest 1 View  Result Date: 05/04/2021 CLINICAL DATA:  Shortness of breath and chest pain EXAM: CHEST  1 VIEW COMPARISON:  None. FINDINGS: Unchanged, enlarged cardiac silhouette with postsurgical changes of CABG. There is pulmonary vascular congestion and continued bibasilar reticular  and airspace opacities. Small right pleural effusions. No visible pneumothorax. No acute osseous abnormality. IMPRESSION: Cardiac with pulmonary vascular congestion and persistent bibasilar reticular and airspace opacities likely representing edema. Small right pleural effusion. Electronically Signed   By: Maurine Simmering   On: 05/04/2021 19:56     Assessment and Plan:   Acute on chronic biventricular failure Moderate pulmonary hypertension - echo 04/10/21 with worsening EF of 25-30%, grade 2 DD, and severely reduced RV function - increase lasix to 80 mg IV TID - follow daily BMP, weight and I&Os - by history, sounds like he has a narrow window between euvolemia and dehydration - will work toward his dry weight of 210 lbs - given his echo from last admission, consider goals of care discussion for end stage heart failure   CAD s/p CABG - patent grafts by cath in 2018 - no angina  - will not pursue additional ischemic evaluation - continue ASA and lipitor   First degree heart block RBBB - not currently on a BB - not symptomatic   Hypertension - losartan D/C'ed for renal function prior to last hospitalization - pressure is well controlled   CKD stage IV - sCr 2.09, BUN 69, CO2 29 - baseline creatinine may be near 2.1-2.5   PAD - recommend VVS consult for wound on left fifth toe - 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 Agree with above.  87 year old with coronary disease status post CABG with patent grafts in 2018, biventricular heart failure secondary to ischemic cardiomyopathy, no ICD, chronic kidney disease stage IV, severe peripheral vascular disease status post toe amputation seen by Dr. Oneida Alar, here with acute systolic heart failure.  Laying in bed fairly comfortable right now with mildly increased work of breathing.  Right leg wrapped, weeping, edema 2+ left leg wound involving fifth toe.  Crackles noted at bases.  Elderly.  EKG/telemetry shows first-degree AV block in the 80s mostly with PVCs, right bundle branch block.  High-sensitivity troponin is 33 and flat Platelets 122 hemoglobin 9.4 albumin 2.8 creatinine decreasing from 2.4-2.09 during this admission.  BUN 69.  Assessment and plan:  Acute on chronic biventricular heart failure, end-stage with concomitant pulmonary hypertension - It has been challenging to monitor his fluid status and regulated as an outpatient.  His torsemide p.o. home doses have fluctuated quite a bit, mostly being driven by his poor renal function/reserve. - Clearly fluid overloaded currently, will continue with IV Lasix but increase to 80mg  3 times a day. -He is not on any beta-blocker ACE/ARB, spironolactone, SGLT2 inhibitor because of chronic kidney disease etc.  Blood pressure currently in the 120s seems adequate for diuresis.  He is not a candidate for intravenous inotropes. -Thankfully he has had home health nurse assisting.  Coronary artery disease status post CABG - Prior cardiac catheterization in 2018 she did show patent grafts.  He is not having any angina.  Continuing with aspirin and atorvastatin.  No evidence of bleeding.  Platelet count slightly low 125,000.  First-degree AV block, right bundle  branch block - Stable, no syncope.  No pacemaker no ICD.  Chronic kidney disease stage IV - With IV Lasix  we will continue to monitor for deterioration.  Challenging situation.  Peripheral vascular disease - Would consider having Dr. Oneida Alar with vascular surgery assess him given deterioration of the fifth toe on the left.  Candee Furbish, MD

## 2021-05-06 NOTE — Progress Notes (Signed)
RT contacted by RN.  Patient wanted to try CPAP.  RT setup CPAP at bedside with 3L O2 bleed in.  Patient resting well.  RT will continue to monitor.

## 2021-05-06 NOTE — Progress Notes (Signed)
PROGRESS NOTE    Scott [name]  LKT:[id number] DOB: 1932-11-19 DOA: 05/04/2021 PCP: Reubin Milan, MD   Brief Narrative: 87 year old with past medical history significant for chronic systolic heart failure, CAD status post CABG stent, hypertension, CKD stage IV, HLD, GI bleed, chronic iron deficiency anemia, gout, thrombocytopenia, PAD who presents to the ER complaining of weight gain, edema worsening shortness of breath.  He was admitted to chronic few weeks ago and left with a weight of 210 pounds.  He has gained 15 pounds.  He has been taking his torsemide 20 mg twice daily.  He increase this to 40 mg twice daily a week ago.  On 7/24 EF is torsemide was increased to 80 mg in the morning and 40 in the afternoon per cardiology note.  Patient admitted with acute on chronic heart failure exacerbation.   Assessment & Plan:   Principal Problem:   Acute exacerbation of CHF (congestive heart failure) (HCC) Active Problems:   Hypertension   Dyslipidemia   Arteriosclerotic coronary artery disease   Diabetes mellitus, type II, insulin dependent (HCC)   OSA (obstructive sleep apnea)   PAD (peripheral artery disease) (HCC)   CKD (chronic kidney disease) stage 4, GFR 15-29 ml/min (HCC)   Iron deficiency anemia   Thrombocytopenia (HCC)   Prolonged QT interval   1-Acute on Chronic Systolic and Diastolic Heart Failure Exacerbation: -Patient presents with creatinine of 2.2, BNP 2626, chest x-ray with pulmonary congestion. -Recent echo 04/2021 ejection fraction 25 to 30% with global hypokinesis, elevated pulmonary systolic pressure. -Dry weight 208--210.  Presented with a weight of 225 -Continue with IV Lasix 80 mg IV twice daily -Cardiology  has been consulted. -Pending output of 1.1 L yesterday Weight : 221  2-Hypertension: Lasix.   CAD: Continue with aspirin and statins Beta-blocker discontinued last hospitalization due to AV block and bradycardia  4-Diabetes type 2  insulin-dependent He is 70/30 as a sliding scale at home SSI  Gout; resume lower dose colchicine due to Renal function.  LE edema, chronic wound.  Appreciate wound care evaluation recommendation.  Patient takes oxycodone PRN. Resume.  Dyslipidemia: Continue with a statin CKD Stage IV: Monitor renal function on IV diuresis  Iron deficiency anemia: Stable Thrombocytopenia chronic, stable.  PAD: Follow-up with Dr. Oneida Alar. GERD: Continue with PPI   Estimated body mass index is 30.05 kg/m as calculated from the following:   Height as of this encounter: 6' (1.829 m).   Weight as of this encounter: 100.5 kg.   DVT prophylaxis: SCDs Code Status: DNR Family Communication: Care discussed with patient Disposition Plan:  Status is: Inpatient  Remains inpatient appropriate because:IV treatments appropriate due to intensity of illness or inability to take PO  Dispo: The patient is from: Home              Anticipated d/c is to: Home              Patient currently is not medically stable to d/c.   Difficult to place patient No        Consultants:  Cardiology   Procedures:  None  Antimicrobials:  None  Subjective: He report SOB, LE edema, and abdominal distension.    Objective: Vitals:   05/05/21 2347 05/06/21 0332 05/06/21 0755 05/06/21 1131  BP: 112/73 121/81 (!) 122/92 122/82  Pulse: 72 69 70 78  Resp: 17 17    Temp: (!) 97.4 F (36.3 C) 97.6 F (36.4 C) 98.4 F (36.9 C)   TempSrc: Oral Oral  Oral   SpO2: 100% 97% 99% 100%  Weight:  100.5 kg    Height:        Intake/Output Summary (Last 24 hours) at 05/06/2021 1441 Last data filed at 05/06/2021 1316 Gross per 24 hour  Intake 1135 ml  Output 1325 ml  Net -190 ml   Filed Weights   05/05/21 2148 05/06/21 0332  Weight: 100.5 kg 100.5 kg    Examination:  General exam: Appears calm and comfortable  Respiratory system: BL crackles.  Cardiovascular system: S1 & S2 heard, RRR.  Gastrointestinal system:  Abdomen is distended, soft and nontender. No organomegaly or masses felt. Normal bowel sounds heard. Central nervous system: Alert and oriented.  Extremities: BL LE with edema, dressing, open wound  Data Reviewed: I have personally reviewed following labs and imaging studies  CBC: Recent Labs  Lab 05/04/21 1931 05/06/21 0215  WBC 7.3 5.5  NEUTROABS 4.8  --   HGB 10.0* 9.4*  HCT 35.2* 31.4*  MCV 98.9 96.3  PLT 122* 268*   Basic Metabolic Panel: Recent Labs  Lab 05/04/21 1931 05/05/21 1328 05/06/21 0215  NA 134* 137 138  K 4.4 4.0 4.4  CL 96* 100 101  CO2 27 27 29   GLUCOSE 169* 128* 141*  BUN 71* 69* 69*  CREATININE 2.42* 2.13* 2.09*  CALCIUM 8.6* 8.5* 8.6*  MG  --  2.0  --    GFR: Estimated Creatinine Clearance: 31.1 mL/min (A) (by C-G formula based on SCr of 2.09 mg/dL (H)). Liver Function Tests: Recent Labs  Lab 05/04/21 1931  AST 26  ALT 11  ALKPHOS 106  BILITOT 1.0  PROT 7.2  ALBUMIN 2.8*   No results for input(s): LIPASE, AMYLASE in the last 168 hours. No results for input(s): AMMONIA in the last 168 hours. Coagulation Profile: No results for input(s): INR, PROTIME in the last 168 hours. Cardiac Enzymes: No results for input(s): CKTOTAL, CKMB, CKMBINDEX, TROPONINI in the last 168 hours. BNP (last 3 results) No results for input(s): PROBNP in the last 8760 hours. HbA1C: No results for input(s): HGBA1C in the last 72 hours. CBG: Recent Labs  Lab 05/05/21 1642 05/06/21 1146  GLUCAP 113* 163*   Lipid Profile: No results for input(s): CHOL, HDL, LDLCALC, TRIG, CHOLHDL, LDLDIRECT in the last 72 hours. Thyroid Function Tests: No results for input(s): TSH, T4TOTAL, FREET4, T3FREE, THYROIDAB in the last 72 hours. Anemia Panel: No results for input(s): VITAMINB12, FOLATE, FERRITIN, TIBC, IRON, RETICCTPCT in the last 72 hours. Sepsis Labs: No results for input(s): PROCALCITON, LATICACIDVEN in the last 168 hours.  Recent Results (from the past 240  hour(s))  Resp Panel by RT-PCR (Flu A&B, Covid) Nasopharyngeal Swab     Status: None   Collection Time: 05/05/21 11:34 AM   Specimen: Nasopharyngeal Swab; Nasopharyngeal(NP) swabs in vial transport medium  Result Value Ref Range Status   SARS Coronavirus 2 by RT PCR NEGATIVE NEGATIVE Final    Comment: (NOTE) SARS-CoV-2 target nucleic acids are NOT DETECTED.  The SARS-CoV-2 RNA is generally detectable in upper respiratory specimens during the acute phase of infection. The lowest concentration of SARS-CoV-2 viral copies this assay can detect is 138 copies/mL. A negative result does not preclude SARS-Cov-2 infection and should not be used as the sole basis for treatment or other patient management decisions. A negative result may occur with  improper specimen collection/handling, submission of specimen other than nasopharyngeal swab, presence of viral mutation(s) within the areas targeted by this assay, and inadequate number of  viral copies(<138 copies/mL). A negative result must be combined with clinical observations, patient history, and epidemiological information. The expected result is Negative.  Fact Sheet for Patients:  EntrepreneurPulse.com.au  Fact Sheet for Healthcare Providers:  IncredibleEmployment.be  This test is no t yet approved or cleared by the [place] FDA and  has been authorized for detection and/or diagnosis of SARS-CoV-2 by FDA under an Emergency Use Authorization (EUA). This EUA will remain  in effect (meaning this test can be used) for the duration of the COVID-19 declaration under Section 564(b)(1) of the Act, 21 U.S.C.section 360bbb-3(b)(1), unless the authorization is terminated  or revoked sooner.       Influenza A by PCR NEGATIVE NEGATIVE Final   Influenza B by PCR NEGATIVE NEGATIVE Final    Comment: (NOTE) The Xpert Xpress SARS-CoV-2/FLU/RSV plus assay is intended as an aid in the diagnosis of influenza from  Nasopharyngeal swab specimens and should not be used as a sole basis for treatment. Nasal washings and aspirates are unacceptable for Xpert Xpress SARS-CoV-2/FLU/RSV testing.  Fact Sheet for Patients: EntrepreneurPulse.com.au  Fact Sheet for Healthcare Providers: IncredibleEmployment.be  This test is not yet approved or cleared by the [place] FDA and has been authorized for detection and/or diagnosis of SARS-CoV-2 by FDA under an Emergency Use Authorization (EUA). This EUA will remain in effect (meaning this test can be used) for the duration of the COVID-19 declaration under Section 564(b)(1) of the Act, 21 U.S.C. section 360bbb-3(b)(1), unless the authorization is terminated or revoked.  Performed at [facility], [place] [address], [place], [place] [address]          Radiology Studies: DG Chest 1 View  Result Date: 05/04/2021 CLINICAL DATA:  Shortness of breath and chest pain EXAM: CHEST  1 VIEW COMPARISON:  None. FINDINGS: Unchanged, enlarged cardiac silhouette with postsurgical changes of CABG. There is pulmonary vascular congestion and continued bibasilar reticular and airspace opacities. Small right pleural effusions. No visible pneumothorax. No acute osseous abnormality. IMPRESSION: Cardiac with pulmonary vascular congestion and persistent bibasilar reticular and airspace opacities likely representing edema. Small right pleural effusion. Electronically Signed   By: Maurine Simmering   On: 05/04/2021 19:56        Scheduled Meds:  ALPRAZolam  0.25 mg Oral QHS   aspirin  81 mg Oral QHS   atorvastatin  20 mg Oral Daily   cholecalciferol  2,000 Units Oral Q M,W,F-2000   febuxostat  40 mg Oral Daily   ferrous sulfate  325 mg Oral Q M,W,F   furosemide  80 mg Intravenous BID   insulin aspart  0-9 Units Subcutaneous TID WC   latanoprost  1 drop Both Eyes QHS   multivitamin with minerals  1 tablet Oral Daily   mupirocin ointment    Nasal BID   sodium chloride flush  3 mL Intravenous Q12H   tamsulosin  0.4 mg Oral QHS   Continuous Infusions:  sodium chloride       LOS: 1 day    Time spent:35 minutes.     Elmarie Shiley, MD [facility]   If 7PM-7AM, please contact night-coverage [email or web address]  05/06/2021, 2:41 PM

## 2021-05-06 NOTE — Consult Note (Signed)
[place] Nurse Consult Note: Patient receiving care in [place] consult was placed by B. Regalado MD for BLE wounds which were assessed yesteday and ordered placed. Please see wound care ordered and note from yesterday. [place] signing off but are available for this patient if needed.  Thank you for the consult. [place] nurse will not follow at this time.   Please re-consult the [place] team if needed.  Cathlean Marseilles Tamala Julian, MSN, RN, [place], [place]le [place], [facility] Wound Treatment Associate Pager [phone]

## 2021-05-07 ENCOUNTER — Inpatient Hospital Stay (HOSPITAL_COMMUNITY): Payer: No Typology Code available for payment source

## 2021-05-07 DIAGNOSIS — N184 Chronic kidney disease, stage 4 (severe): Secondary | ICD-10-CM

## 2021-05-07 DIAGNOSIS — I739 Peripheral vascular disease, unspecified: Secondary | ICD-10-CM

## 2021-05-07 DIAGNOSIS — I5023 Acute on chronic systolic (congestive) heart failure: Secondary | ICD-10-CM

## 2021-05-07 DIAGNOSIS — L97529 Non-pressure chronic ulcer of other part of left foot with unspecified severity: Secondary | ICD-10-CM

## 2021-05-07 LAB — GLUCOSE, CAPILLARY
Glucose-Capillary: 107 mg/dL — ABNORMAL HIGH (ref 70–99)
Glucose-Capillary: 150 mg/dL — ABNORMAL HIGH (ref 70–99)
Glucose-Capillary: 157 mg/dL — ABNORMAL HIGH (ref 70–99)
Glucose-Capillary: 164 mg/dL — ABNORMAL HIGH (ref 70–99)

## 2021-05-07 LAB — BASIC METABOLIC PANEL
Anion gap: 7 (ref 5–15)
BUN: 67 mg/dL — ABNORMAL HIGH (ref 8–23)
CO2: 31 mmol/L (ref 22–32)
Calcium: 8.6 mg/dL — ABNORMAL LOW (ref 8.9–10.3)
Chloride: 100 mmol/L (ref 98–111)
Creatinine, Ser: 2.23 mg/dL — ABNORMAL HIGH (ref 0.61–1.24)
GFR, Estimated: 28 mL/min — ABNORMAL LOW (ref >60)
Glucose, Bld: 153 mg/dL — ABNORMAL HIGH (ref 70–99)
Potassium: 4.1 mmol/L (ref 3.5–5.1)
Sodium: 138 mmol/L (ref 135–145)

## 2021-05-07 LAB — CBC
HCT: 31.2 % — ABNORMAL LOW (ref 39.0–52.0)
Hemoglobin: 9.4 g/dL — ABNORMAL LOW (ref 13.0–17.0)
MCH: 29 pg (ref 26.0–34.0)
MCHC: 30.1 g/dL (ref 30.0–36.0)
MCV: 96.3 fL (ref 80.0–100.0)
Platelets: 104 10*3/uL — ABNORMAL LOW (ref 150–400)
RBC: 3.24 MIL/uL — ABNORMAL LOW (ref 4.22–5.81)
RDW: 17.7 % — ABNORMAL HIGH (ref 11.5–15.5)
WBC: 5.2 10*3/uL (ref 4.0–10.5)
nRBC: 0 % (ref 0.0–0.2)

## 2021-05-07 NOTE — TOC Initial Note (Addendum)
Transition of Care [facility]) - Initial/Assessment Note    Patient Details  Name: Scott [name] MRN: [id number] Date of Birth: [birth year]  [facility] CM/SW Contact:    Zenon Mayo, RN Phone Number: 05/07/2021, 5:24 PM  Clinical Narrative:                 NCM spoke with patient at bedside, he is active with [place] for [place], [place], [place], will need resumptions orders.  Patient would like to continue with them.  He states his brother, [name] comes to check on him every day.   7/29- NCM received call from [facility] with [facility] stating patient is active with them.   NCM will contact [place] to inform.  Expected Discharge Plan: [place] Barriers to Discharge: Continued Medical Work up   Patient Goals and CMS Choice Patient states their goals for this hospitalization and ongoing recovery are:: return home CMS Medicare.gov Compare Post Acute Care list provided to:: Patient Choice offered to / list presented to : Patient  Expected Discharge Plan and Services Expected Discharge Plan: [place]   Discharge Planning Services: CM Consult   Living arrangements for the past 2 months: Single Family Home                           HH Arranged: RN, PT, OT [facility] Agency: [place] Date [place]: 05/07/21 Time [place]: 1722 Representative spoke with at [place]: [place] Arrangements/Services Living arrangements for the past 2 months: [place] with:: Self Patient language and need for interpreter reviewed:: Yes Do you feel safe going back to the place where you live?: Yes      Need for Family Participation in Patient Care: Yes (Comment) Care giver support system in place?: Yes (comment) (brother , [name])   Criminal Activity/Legal Involvement Pertinent to Current Situation/Hospitalization: No - Comment as needed  Activities of Daily Living Home Assistive  Devices/Equipment: CPAP, Walker (specify type), Cane (specify quad or straight), Blood pressure cuff, CBG Meter, Shower chair with back, Scales ADL Screening (condition at time of admission) Patient's cognitive ability adequate to safely complete daily activities?: Yes Is the patient deaf or have difficulty hearing?: No Does the patient have difficulty seeing, even when wearing glasses/contacts?: No Does the patient have difficulty concentrating, remembering, or making decisions?: No Patient able to express need for assistance with ADLs?: Yes Does the patient have difficulty dressing or bathing?: No Independently performs ADLs?: No Communication: Independent Dressing (OT): Needs assistance Is this a change from baseline?: Change from baseline, expected to last <3days Grooming: Independent Feeding: Independent Bathing: Needs assistance Is this a change from baseline?: Change from baseline, expected to last <3 days Toileting: Needs assistance Is this a change from baseline?: Change from baseline, expected to last <3 days In/Out Bed: Needs assistance Is this a change from baseline?: Change from baseline, expected to last <3 days Walks in Home: Needs assistance Is this a change from baseline?: Change from baseline, expected to last <3 days Does the patient have difficulty walking or climbing stairs?: Yes Weakness of Legs: Both Weakness of Arms/Hands: Both  Permission Sought/Granted                  Emotional Assessment Appearance:: Appears stated age Attitude/Demeanor/Rapport: Engaged Affect (typically observed): Appropriate Orientation: : Oriented to Situation, Oriented to  Time, Oriented to Place, Oriented to  Self Alcohol / Substance Use: Not Applicable    Admission diagnosis:  CHF (congestive heart failure) (HCC) [I50.9] Hypoalbuminemia [E88.09] Thrombocytopenia (HCC) [D69.6] CKD (chronic kidney disease) stage 4, GFR 15-29 ml/min (HCC) [N18.4] Acute exacerbation of CHF  (congestive heart failure) (HCC) [I50.9] Acute on chronic combined systolic and diastolic CHF (congestive heart failure) ([place]) [I50.43] Patient Active Problem List   Diagnosis Date Noted   Chronic low back pain 04/24/2021   History of coronary artery bypass graft 04/24/2021   Iron deficiency anemia 04/24/2021   Knee pain 04/24/2021   Thrombocytopenia ([place]) 04/24/2021   Ulcer of lower limb ([place]) 04/24/2021   Actinic keratosis 04/24/2021   Alcohol abuse 04/24/2021   Anemia in chronic kidney disease 04/24/2021   Basal cell carcinoma of face 04/24/2021   Cataract 04/24/2021   Cerebral aneurysm, nonruptured 04/24/2021   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, sequela ([place]) 04/24/2021   Cough 04/24/2021   Diarrhea, unspecified 04/24/2021   Diarrhea 04/24/2021   Encounter for immunization 04/24/2021   Clostridium difficile diarrhea 04/24/2021   Enthesopathy of ankle and tarsus 04/24/2021   Esophageal stricture 04/24/2021   Gout 04/24/2021   Hernia of other specified sites of abdominal cavity 04/24/2021   Hypertrophy of breast 04/24/2021   Hypovolemia 04/24/2021   Large liver 04/24/2021   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   Malignant neoplasm of colon ([place]) 04/24/2021   Need for assistance with personal care 04/24/2021   Neoplasm of uncertain behavior of eye 04/24/2021   Other and unspecified hyperlipidemia 04/24/2021   Other ill-defined and unknown causes of morbidity and mortality 65/46/5035   Other lichen planus 46/56/8127   Peripheral sensory neuropathy due to type 2 diabetes mellitus ([place]) 04/24/2021   Pain, unspecified 04/24/2021   Primary open-angle glaucoma, bilateral, moderate stage 04/24/2021   Prolonged QT interval 04/24/2021   Spinal stenosis, other than cervical 04/24/2021   Traumatic arthropathy 04/24/2021   Venous insufficiency (chronic) (peripheral) 04/24/2021   CKD (chronic kidney disease) stage 4, GFR 15-29 ml/min (HCC) 04/10/2021   Acute  exacerbation of CHF (congestive heart failure) ([place]) 04/09/2021   CHF (congestive heart failure) ([place]) 04/09/2021   Cellulitis 04/03/2021   Acute on chronic blood loss anemia 03/10/2021   Acute kidney injury superimposed on CKD ([place]) 03/10/2021   Elevated troponin 03/10/2021   Shortness of breath 03/10/2021   Multiple gastric ulcers    Acute upper GI bleed 03/09/2021   Osteomyelitis of fifth toe of right foot ([place]) 02/25/2020   History of amputation of lesser toe ([place]) 02/25/2020   PAD (peripheral artery disease) ([place]) 01/03/2020   Choledocholithiasis    Pancreatitis 10/16/2017   Midsternal chest pain 05/21/2017   Dupuytren's contracture 02/25/2016   Nausea - without vomiting 01/01/2015   Low grade fever 01/01/2015   DOE (dyspnea on exertion) 01/01/2015   Unstable angina ([place]) 05/12/2013   Chronic combined systolic and diastolic heart failure ([place]) 05/12/2013   Personal history of other malignant neoplasm of skin 04/05/2013   OSA (obstructive sleep apnea) 07/04/2012   Dyspnea 11/05/2011   Diabetes mellitus, type II, insulin dependent ([place]) 04/07/2011   Hypertension    Ischemic cardiomyopathy    Myocardial infarction [facility])    Dyslipidemia    Arteriosclerotic coronary artery disease    History of adenomatous polyp of colon 10/12/2003   Type 2 or unspecified type diabetes mellitus 10/11/1990   PCP:  Reubin Milan, MD Pharmacy:   [facility], [place] - [address] AT [place] [address]  [place] [address] Phone: [phone] Fax: [phone]     Social Determinants of Health (SDOH) Interventions    Readmission Risk Interventions Readmission Risk Prevention Plan 05/07/2021 04/14/2021 04/10/2021  Transportation Screening Complete Complete Complete  PCP or Specialist Appt within 5-7 Days - - -  PCP or Specialist Appt within 3-5 Days - Complete -  Home Care Screening - - -  Medication Review (RN CM) - - -  [place] or  [place] - Complete -  Social Work Consult for [place] Planning/Counseling - Complete Complete  Palliative Care Screening - Not Applicable Not Applicable  Medication Review Press photographer) Complete Complete -  [place] or Home Care Consult Complete - -  SW Recovery Care/Counseling Consult Complete - -  Palliative Care Screening Not Applicable - -  [place] Not Applicable - -  Some recent data might be hidden

## 2021-05-07 NOTE — H&P (View-Only) (Signed)
Hospital Consult    Reason for Consult:  RLE wound Requesting Physician:  Dr. Tyrell Antonio MRN #:  [id number]  History of Present Illness: This is a 87 y.o. male who is well known to [facility] for his peripheral arterial disease. He presented to the ER due increased weight, edema and worsening shortness of breath. He has history of multiple interventions on his right lower extremity, most recently having undergone amputation of his right 4th toe with resection of the metatarsal head in December of 2021 by Dr. Oneida Alar. Prior to this he and Angiogram in March of 2021 that showed severed tibial occlusive disease with single vessel ATA runoff. Despite his tibial disease at his last office follow up his amputation site had essentially healed. He explains that his right leg has been doing well since he was last seen.  He was suppose to follow up in June but due to hospitalization he missed his appointments. He does have mixed venous disease and has had chronic venous changes with edema of his legs for a long time. This is unchanged. He explains that he was unaware of having any wounds on his left foot until he presented to the ED on Monday. His feet hurt him constantly. He says this is worse on ambulation. He does have some neuropathy. The pain is not waking him up at night. He reports no fever or chills.   Past Medical History:  Diagnosis Date   Acute biliary pancreatitis 10/2017   with choledocholithiasis.  ERCP, stone extraction performed   Arthritis    CAD (coronary artery disease)    a. 1997 CABGx3 (VG->RPDA, VG->PLV, LIMA->LAD);  b. 07/2004 PCI VG->PLV (3.5x16 Taxus DES, 3.5x12 Taxus DES); c. 11/2009 PCI: VG->PLV 3.5x12 Promus DES), VG->RPDA (3.5x15 Promus DES); d. 08/2012 Cath: patent grafts; e. 05/2013 Lexiscan MV: no ischemia/infarction, EF 44%.   Cataract    Chronic combined systolic and diastolic CHF (congestive heart failure) ([place]) 08/2012   a. EF 50-55% 11/2011; b. 08/2012 - EF 25-30%,  with mild RM,  mod TR, mod RAE & LAE, moderate reduced RV systolic function and PASP of 43mmHg. - > cath showed patent grafts & Mod Puml HTN wth elevated PCWP;  c. 01/2014 Echo: EF 35-40%, mod LVH, mod HK, mildly dil LA, mod dil RA, PASP 25mmHg.   CKD (chronic kidney disease), stage III (HCC)    Dyslipidemia    GERD (gastroesophageal reflux disease)    Gout    history colon cancer 2005   colon surgery done   History of home oxygen therapy    uses oxygen nightly with cpap    Hyperkalemia    Hypertension    Insulin Dependent Diabetes mellitus    type 2   Myocardial infarction [facility]) 1997   PAD (peripheral artery disease) ([place])    Sleep apnea    Thrombocytopenia ([place]) 2011   dates back to 2011   UTI (lower urinary tract infection) 08/2012   E.coli    Past Surgical History:  Procedure Laterality Date   ABDOMINAL AORTOGRAM W/LOWER EXTREMITY Right 01/04/2020   Abdominal aortogram with right lower extremity runoff   ABDOMINAL AORTOGRAM W/LOWER EXTREMITY Bilateral 01/04/2020   Procedure: ABDOMINAL AORTOGRAM W/LOWER EXTREMITY;  Surgeon: Elam Dutch, MD;  Location: [place] CV LAB;  Service: Vascular;  Laterality: Bilateral;   AMPUTATION Right 02/25/2020   Procedure: RIGHT FIFTH TOE AMPUTATION;  Surgeon: Elam Dutch, MD;  Location: [place];  Service: Vascular;  Laterality: Right;   AMPUTATION Right 10/06/2020  Procedure: RIGHT FOURTH TOE AMPUTATION WITH RESECTION OF METATARSAL HEAD;  Surgeon: Elam Dutch, MD;  Location: [facility] OR;  Service: Vascular;  Laterality: Right;   APPENDECTOMY     BACK SURGERY     15 years ago lower   BIOPSY  03/10/2021   Procedure: BIOPSY;  Surgeon: Irene Shipper, MD;  Location: [facility] ENDOSCOPY;  Service: Endoscopy;;   CARDIAC CATHETERIZATION  2005   stenting of the vein graft to the posterior lateral per -- Dr. Martinique       CARDIAC CATHETERIZATION  2011   showed right coronary, totally occluded LAD and severe stenosis in both saphenous vein graft to the posterior  lateral and posterior descending  -- stenting of the proximal portion of the SVG to the posterior lateral branch in 2/11   CHOLECYSTECTOMY     COLONOSCOPY     x several - colon polyps   CORONARY ARTERY BYPASS GRAFT  1997    (LIMA to LAD, SVG to PDA, SVG to PL)   ERCP N/A 10/17/2017    Irene Shipper, MD;  [facility] ENDOSCOPY; choledocholithiasis, biliary pancreatitis.  s/p sphincterotomy and stone extraction.     ESOPHAGOGASTRODUODENOSCOPY (EGD) WITH PROPOFOL N/A 09/21/2017   Procedure: ESOPHAGOGASTRODUODENOSCOPY (EGD) WITH PROPOFOL;  Surgeon: Doran Stabler, MD;  Location: [facility] ENDOSCOPY;  Service: Gastroenterology;  Laterality: N/A;   ESOPHAGOGASTRODUODENOSCOPY (EGD) WITH PROPOFOL N/A 03/10/2021   Procedure: ESOPHAGOGASTRODUODENOSCOPY (EGD) WITH PROPOFOL;  Surgeon: Irene Shipper, MD;  Location: [facility] ENDOSCOPY;  Service: Endoscopy;  Laterality: N/A;   EYE SURGERY     ioc for cataract   HERNIA REPAIR     LEFT AND RIGHT HEART CATHETERIZATION WITH CORONARY/GRAFT ANGIOGRAM  09/04/2012   Procedure: LEFT AND RIGHT HEART CATHETERIZATION WITH Beatrix Fetters;  Surgeon: Sherren Mocha, MD;  Location: [facility] CATH LAB;  patent grafts, mod 2ndary pulmonary HTN, elevated LV EDP & PCWP   LEFT HEART CATH AND CORS/GRAFTS ANGIOGRAPHY N/A 06/17/2017   Procedure: LEFT HEART CATH AND CORS/GRAFTS ANGIOGRAPHY;  Surgeon: Martinique, Peter M, MD;  Location: [place] CV LAB;  Service: Cardiovascular;  Laterality: N/A;   mohs     x several   PARTIAL COLECTOMY     cancerous polyps   stent to heart  last done 5 to 6 yrs ago   x 4   UPPER GI ENDOSCOPY      No Known Allergies  Prior to Admission medications   Medication Sig Start Date End Date Taking? Authorizing Provider  ALPRAZolam (XANAX) 0.25 MG tablet Take 0.25 mg by mouth at bedtime.   Yes [provider]  aspirin 81 MG EC tablet Take 81 mg by mouth at bedtime.   Yes [provider]  atorvastatin (LIPITOR) 20 MG tablet Take 1 tablet (20 mg total)  by mouth daily. 03/02/21 02/25/22 Yes Martinique, Peter M, MD  carvedilol (COREG) 12.5 MG tablet Take 6.25 mg by mouth 2 (two) times daily. 04/08/21  Yes [provider]  Cholecalciferol (VITAMIN D) 50 MCG (2000 UT) tablet Take 2,000 Units by mouth every Monday, Wednesday, and Friday at 8 PM.   Yes [provider]  colchicine 0.6 MG tablet Take 0.6-1.2 mg by mouth See admin instructions. Take 2 tablets (1.2 mg) by mouth at onset of gout attack, may take 1 tablet (0.6 mg) one hour later if still needed. Max 3 tablets in 3 days   Yes [provider]  docusate sodium (COLACE) 100 MG capsule Take 100 mg by mouth daily as  needed for mild constipation.   Yes [provider]  Febuxostat 80 MG TABS Take 40 mg by mouth daily.   Yes [provider]  ferrous sulfate 325 (65 FE) MG tablet Take 325 mg by mouth every Monday, Wednesday, and Friday.   Yes [provider]  fluticasone (FLONASE) 50 MCG/ACT nasal spray Place 1 spray into both nostrils daily as needed for allergies.   Yes [provider]  insulin aspart protamine- aspart (NOVOLOG MIX 70/30) (70-30) 100 UNIT/ML injection Inject 30 Units into the skin daily as needed (as per blood sugar level).   Yes [provider]  latanoprost (XALATAN) 0.005 % ophthalmic solution Place 1 drop into both eyes at bedtime.   Yes [provider]  Multiple Vitamin (MULTIVITAMIN WITH MINERALS) TABS tablet Take 1 tablet by mouth daily.   Yes [provider]  nitroGLYCERIN (NITROSTAT) 0.4 MG SL tablet Place 1 tablet (0.4 mg total) under the tongue every 5 (five) minutes as needed for chest pain (up to 3 doses). 02/20/15  Yes Martinique, Peter M, MD  oxyCODONE-acetaminophen (PERCOCET/ROXICET) 5-325 MG tablet Take 1 tablet by mouth every 6 (six) hours as needed for severe pain. 03/31/21  Yes Medina-Vargas, Monina C, NP  PRESCRIPTION MEDICATION Inhale into the lungs at bedtime. CPAP   Yes [provider]  tamsulosin (FLOMAX) 0.4 MG CAPS capsule Take 0.4 mg by mouth at bedtime.   Yes [provider]  torsemide (DEMADEX) 20 MG tablet Take 80 mg ( 4 tablets) in th morning and 40 mg ( 2 tablets)  in the evening. Patient taking differently: Take 40-60 mg by mouth See admin instructions. Take 60 mg in th morning and 40 mg in the evening. 05/01/21  Yes Martinique, Peter M, MD    Social History   Socioeconomic History   Marital status: Single    Spouse name: Not on file   Number of children: 0   Years of education: Not on file   Highest education level: Not on file  Occupational History   Occupation: retired   ran a golf course  Tobacco Use   Smoking status: Former    Packs/day: 1.50    Years: 35.00    Pack years: 52.50    Types: Cigarettes    Quit date: 10/12/1983    Years since quitting: 37.5   Smokeless tobacco: Never  Vaping Use   Vaping Use: Never used  Substance and Sexual Activity   Alcohol use: Yes    Alcohol/week: 3.0 - 4.0 standard drinks    Types: 3 - 4 Standard drinks or equivalent per week   Drug use: No   Sexual activity: Not Currently    Birth control/protection: None  Other Topics Concern   Not on file  Social History Narrative   Lives in [place] by himself.  Active but does not routinely exercise.   Social Determinants of Health   Financial Resource Strain: Not on file  Food Insecurity: Not on file  Transportation Needs: No Transportation Needs   Lack of Transportation (Medical): No   Lack of Transportation (Non-Medical): No  Physical Activity: Not on file  Stress: Not on file  Social Connections: Not on file  Intimate Partner Violence: Not on file     Family History  Problem Relation Age of Onset   Coronary artery disease Father    Heart attack Father    Coronary artery disease Brother        1/2 brother    Kidney  disease Sister    Cardiomyopathy Mother    Asthma Brother     ROS: Otherwise negative unless mentioned in  HPI  Physical Examination  Vitals:   05/07/21 0346 05/07/21 0827  BP: 121/77 126/76  Pulse: (!) 57 68  Resp: 20   Temp: 97.7 F (36.5 C)   SpO2: 99% 96%   Body mass index is 30.19 kg/m.  General:  WDWN in NAD, sitting up on side of bed eating lunch Gait: Not observed HENT: WNL, normocephalic Pulmonary: normal non-labored breathing  Cardiac: regular rate and rhythm Abdomen: soft, NT/ND, no masses Vascular Exam/Pulses: 2+ femoral pulses bilaterally, no palpable distal pulses. Doppler Popliteal, PT, DP, peroneal signals bilaterally. Feet warm and well perfused. Right 4th toe amputation site and prior 5th toe amputation sites well healed.  Extremities: without ischemic changes, without Gangrene , without cellulitis; with ulceration overlying 5th metatarsal head on plantar aspect of left foot, some maceration and callus surrounding      Excoriations with some weeping from right posterior medial right leg. Xeroform and Kerlix reapplied Musculoskeletal: no muscle wasting or atrophy  Neurologic: A&O X 3;  No focal weakness or paresthesias are detected; speech is fluent/normal Psychiatric:  The pt has Normal affect.   CBC    Component Value Date/Time   WBC 5.2 05/07/2021 0909   RBC 3.24 (L) 05/07/2021 0909   HGB 9.4 (L) 05/07/2021 0909   HGB 11.1 (L) 01/17/2020 1551   HCT 31.2 (L) 05/07/2021 0909   HCT 33.8 (L) 01/17/2020 1551   PLT 104 (L) 05/07/2021 0909   PLT 147 (L) 01/17/2020 1551   MCV 96.3 05/07/2021 0909   MCV 91 01/17/2020 1551   MCH 29.0 05/07/2021 0909   MCHC 30.1 05/07/2021 0909   RDW 17.7 (H) 05/07/2021 0909   RDW 13.5 01/17/2020 1551   LYMPHSABS 1.0 05/04/2021 1931   LYMPHSABS 1.3 01/17/2020 1551   MONOABS 1.2 (H) 05/04/2021 1931   EOSABS 0.2 05/04/2021 1931   EOSABS 0.6 (H) 01/17/2020 1551   BASOSABS 0.1 05/04/2021 1931   BASOSABS 0.1 01/17/2020 1551    BMET    Component Value Date/Time   NA 138 05/07/2021 0909   NA 137 04/27/2021 1136   K 4.1  05/07/2021 0909   CL 100 05/07/2021 0909   CO2 31 05/07/2021 0909   GLUCOSE 153 (H) 05/07/2021 0909   BUN 67 (H) 05/07/2021 0909   BUN 64 (H) 04/27/2021 1136   CREATININE 2.23 (H) 05/07/2021 0909   CREATININE 1.29 12/30/2014 1352   CALCIUM 8.6 (L) 05/07/2021 0909   GFRNONAA 28 (L) 05/07/2021 0909   GFRAA 21.85 03/31/2021 0000    COAGS: Lab Results  Component Value Date   INR 1.5 (H) 03/10/2021   INR 1.1 01/03/2020   INR 1.1 06/09/2017     Non-Invasive Vascular Imaging:   Arterial duplex and ABIs pending  Statin:  Yes.   Beta Blocker:  Yes.   Aspirin:  Yes.   ACEI:  No. ARB:  No. CCB use:  No Other antiplatelets/anticoagulants:  No.    ASSESSMENT/PLAN: This is a 87 y.o. male with peripheral artery disease who presents with new left foot ulcerations. Appears to be a diabetic pressure ulcer. He does have doppler PT/ Dp and Peroneal signals in left foot. I suspect he has underlying tibial disease very similar to his RLE. He also has mixed venous disease of both lower extremities that will benefit from elevation and eventually light compression. Continue local wound care. Elevate legs  when in Bed. He has CKD stage IV with baseline Scr of 2-3 so caution use of contrast dye, however recommend obtaining LLE arteriogram prior to any foot debridement. Will likely need to perform this with combination of dye and CO2 - Order placed for Arterial duplex of LLE and ABI's- results pending - continue Aspirin and statin - Plan will be for Aortogram, Arteriogram of the LLE on Monday 8/1 with Dr. Donzetta Matters - Recommend hydrating over the weekend if he can tolerate it with his current cardiac and pulmonary status which may not be possible - On call MD, Dr. Donzetta Matters will see patient later this afternoon to provide any further recommendations    Karoline Caldwell PA-C [facility] [phone] 05/07/2021  12:07 PM  I have independently interviewed and examined patient and agree with PA  assessment and plan above.  I reviewed his previous ABIs as well as bilateral lower extremity popliteal artery duplexes which demonstrate small bilateral popliteal artery aneurysms.  I also reviewed his previous angiography which was limited to the aorta and the right lower extremity only.  The right leg has some superficial ulceration as does the right foot on the plantar aspect.  Left foot wound much larger although does not appear infected.  Previous ABIs were monophasic would likely benefit from angiography but will get duplex and ABIs prior.  Angiogram possible Monday, August 1.  I discussed this plan with the patient he demonstrates good understanding.  We will follow-up ABIs and duplex  Lizmarie Witters C. Donzetta Matters, MD [facility] [place] Office: [phone] Pager: [phone]

## 2021-05-07 NOTE — Progress Notes (Signed)
Progress Note  Patient Name: Scott [name] Date of Encounter: 05/07/2021  [facility] Cardiologist: Peter Martinique, MD   Subjective   Perhaps feeling slightly better this morning but not at baseline.  Still short of breath.  Laying fairly flat in bed.  Inpatient Medications    Scheduled Meds:  ALPRAZolam  0.25 mg Oral QHS   aspirin  81 mg Oral QHS   atorvastatin  20 mg Oral Daily   cholecalciferol  2,000 Units Oral Q M,W,F-2000   colchicine  0.3 mg Oral Daily   febuxostat  40 mg Oral Daily   ferrous sulfate  325 mg Oral Q M,W,F   furosemide  80 mg Intravenous TID   insulin aspart  0-9 Units Subcutaneous TID WC   latanoprost  1 drop Both Eyes QHS   multivitamin with minerals  1 tablet Oral Daily   mupirocin ointment   Nasal BID   sodium chloride flush  3 mL Intravenous Q12H   tamsulosin  0.4 mg Oral QHS   Continuous Infusions:  sodium chloride     PRN Meds: sodium chloride, acetaminophen **OR** acetaminophen, docusate sodium, fluticasone, nitroGLYCERIN, oxyCODONE, sodium chloride flush   Vital Signs    Vitals:   05/06/21 1932 05/06/21 2351 05/07/21 0023 05/07/21 0346  BP: 128/80   121/77  Pulse: 63 68  (!) 57  Resp: 20 18  20   Temp: 97.7 F (36.5 C)   97.7 F (36.5 C)  TempSrc: Oral   Oral  SpO2: 98% 99%  99%  Weight:   101 kg   Height:        Intake/Output Summary (Last 24 hours) at 05/07/2021 0825 Last data filed at 05/07/2021 4098 Gross per 24 hour  Intake 483 ml  Output 1075 ml  Net -592 ml   Last 3 Weights 05/07/2021 05/06/2021 05/05/2021  Weight (lbs) 222 lb 9.6 oz 221 lb 9.6 oz 221 lb 9.6 oz  Weight (kg) 100.971 kg 100.517 kg 100.517 kg      Telemetry    Sinus rhythm right bundle branch block- Personally Reviewed  ECG    Sinus rhythm right bundle branch block first-degree AV block- Personally Reviewed  Physical Exam   GEN: No acute distress.  Elderly Neck: No JVD Cardiac: RRR, no murmurs, rubs, or gallops.  Respiratory: Crackles at bases  bilaterally. GI: Soft, nontender, non-distended  MS: Weeping edema right leg, left with 1+ edema; left fifth toe wound. Neuro:  Nonfocal  Psych: Normal affect   Labs    High Sensitivity Troponin:   Recent Labs  Lab 04/09/21 1208 04/09/21 1408  TROPONINIHS 35* 33*      Chemistry Recent Labs  Lab 05/04/21 1931 05/05/21 1328 05/06/21 0215  NA 134* 137 138  K 4.4 4.0 4.4  CL 96* 100 101  CO2 27 27 29   GLUCOSE 169* 128* 141*  BUN 71* 69* 69*  CREATININE 2.42* 2.13* 2.09*  CALCIUM 8.6* 8.5* 8.6*  PROT 7.2  --   --   ALBUMIN 2.8*  --   --   AST 26  --   --   ALT 11  --   --   ALKPHOS 106  --   --   BILITOT 1.0  --   --   GFRNONAA 25* 30* 30*  ANIONGAP 11 10 8      Hematology Recent Labs  Lab 05/04/21 1931 05/06/21 0215  WBC 7.3 5.5  RBC 3.56* 3.26*  HGB 10.0* 9.4*  HCT 35.2* 31.4*  MCV  98.9 96.3  MCH 28.1 28.8  MCHC 28.4* 29.9*  RDW 17.5* 17.6*  PLT 122* 125*    BNP Recent Labs  Lab 05/04/21 1931  BNP 2,626.7*     DDimer No results for input(s): DDIMER in the last 168 hours.   Radiology    No results found.  Cardiac Studies    Echo 04/10/21: 1. Left ventricular ejection fraction, by estimation, is 25 to 30%. The  left ventricle has severely decreased function. The left ventricle  demonstrates global hypokinesis. The left ventricular internal cavity size  was moderately dilated. Left  ventricular diastolic parameters are consistent with Grade II diastolic  dysfunction (pseudonormalization). Elevated left ventricular end-diastolic  pressure.   2. Right ventricular systolic function is severely reduced. The right  ventricular size is severely enlarged. There is severely elevated  pulmonary artery systolic pressure.   3. Left atrial size was severely dilated.   4. Right atrial size was moderately dilated.   5. The mitral valve is degenerative. Mild mitral valve regurgitation.   6. Tricuspid valve regurgitation is mild to moderate.   7. The  aortic valve is tricuspid. There is mild calcification of the  aortic valve. There is mild thickening of the aortic valve. Aortic valve  regurgitation is mild.  Patient Profile     87 y.o. male with a hx of CAD, chronic systolic and diastolic heart failure, right heart failure, pulmonary hypertension, HTN, HLD, IDDM, IDA, CKD stage IV, OSA on CPAP, and bradycardia who is being seen  for the evaluation of CHF at the request of Dr. Tyrell Antonio  Assessment & Plan    Acute on chronic systolic and diastolic heart failure, biventricular - EF 25 to 30%, right-sided dysfunction as well -On Lasix 80 mg IV 3 times a day.  This was increased from twice a day on 05/06/2021.  Continue with diuresis.  Weight unchanged. -Diuresis has been difficult given his chronic kidney disease stage IV with drastic alterations in his BUN/creatinine.  Very challenging situation.  Clearly has end-stage heart failure. -Not a candidate for invasive therapies or inotropes.  Coronary artery disease status post CABG - LIMA to LAD SVG to PLA SVG to PDA - Stable no anginal symptoms  Right bundle branch block with first-degree AV block - Currently not on beta-blocker.  Not symptomatic with this.  No pacemaker needs.  Essential hypertension - Losartan was discontinued secondary to renal dysfunction at prior hospitalization.  Pressure is currently well controlled.  Trying to continue to have adequate pressures for diuresis.  Chronic kidney disease stage IV - Serum creatinine around 2-2.5 at baseline.  Continue to monitor closely with aggressive diuresis.  This will make it challenging. -Home doses of torsemide for instance were drastically reduced because of worsening renal function appropriately so.  However at the same time fluid overload took place.  Peripheral arterial disease - Fifth toe wound noted.  Wound care nurse has evaluated.  He does see Dr. Oneida Alar with vascular surgery.     For questions or updates, please  contact [place] Please consult [email or web address] for contact info under        Signed, Candee Furbish, MD  05/07/2021, 8:25 AM

## 2021-05-07 NOTE — Consult Note (Addendum)
Hospital Consult    Reason for Consult:  RLE wound Requesting Physician:  Dr. Tyrell Antonio MRN #:  [id number]  History of Present Illness: This is a 87 y.o. male who is well known to [facility] for his peripheral arterial disease. He presented to the ER due increased weight, edema and worsening shortness of breath. He has history of multiple interventions on his right lower extremity, most recently having undergone amputation of his right 4th toe with resection of the metatarsal head in December of 2021 by Dr. Oneida Alar. Prior to this he and Angiogram in March of 2021 that showed severed tibial occlusive disease with single vessel ATA runoff. Despite his tibial disease at his last office follow up his amputation site had essentially healed. He explains that his right leg has been doing well since he was last seen.  He was suppose to follow up in June but due to hospitalization he missed his appointments. He does have mixed venous disease and has had chronic venous changes with edema of his legs for a long time. This is unchanged. He explains that he was unaware of having any wounds on his left foot until he presented to the ED on Monday. His feet hurt him constantly. He says this is worse on ambulation. He does have some neuropathy. The pain is not waking him up at night. He reports no fever or chills.   Past Medical History:  Diagnosis Date   Acute biliary pancreatitis 10/2017   with choledocholithiasis.  ERCP, stone extraction performed   Arthritis    CAD (coronary artery disease)    a. 1997 CABGx3 (VG->RPDA, VG->PLV, LIMA->LAD);  b. 07/2004 PCI VG->PLV (3.5x16 Taxus DES, 3.5x12 Taxus DES); c. 11/2009 PCI: VG->PLV 3.5x12 Promus DES), VG->RPDA (3.5x15 Promus DES); d. 08/2012 Cath: patent grafts; e. 05/2013 Lexiscan MV: no ischemia/infarction, EF 44%.   Cataract    Chronic combined systolic and diastolic CHF (congestive heart failure) ([place]) 08/2012   a. EF 50-55% 11/2011; b. 08/2012 - EF 25-30%,  with mild RM,  mod TR, mod RAE & LAE, moderate reduced RV systolic function and PASP of 39mmHg. - > cath showed patent grafts & Mod Puml HTN wth elevated PCWP;  c. 01/2014 Echo: EF 35-40%, mod LVH, mod HK, mildly dil LA, mod dil RA, PASP 46mmHg.   CKD (chronic kidney disease), stage III (HCC)    Dyslipidemia    GERD (gastroesophageal reflux disease)    Gout    history colon cancer 2005   colon surgery done   History of home oxygen therapy    uses oxygen nightly with cpap    Hyperkalemia    Hypertension    Insulin Dependent Diabetes mellitus    type 2   Myocardial infarction [facility]) 1997   PAD (peripheral artery disease) ([place])    Sleep apnea    Thrombocytopenia ([place]) 2011   dates back to 2011   UTI (lower urinary tract infection) 08/2012   E.coli    Past Surgical History:  Procedure Laterality Date   ABDOMINAL AORTOGRAM W/LOWER EXTREMITY Right 01/04/2020   Abdominal aortogram with right lower extremity runoff   ABDOMINAL AORTOGRAM W/LOWER EXTREMITY Bilateral 01/04/2020   Procedure: ABDOMINAL AORTOGRAM W/LOWER EXTREMITY;  Surgeon: Elam Dutch, MD;  Location: [facility];  Service: Vascular;  Laterality: Bilateral;   AMPUTATION Right 02/25/2020   Procedure: RIGHT FIFTH TOE AMPUTATION;  Surgeon: Elam Dutch, MD;  Location: [facility];  Service: Vascular;  Laterality: Right;   AMPUTATION Right 10/06/2020  Procedure: RIGHT FOURTH TOE AMPUTATION WITH RESECTION OF METATARSAL HEAD;  Surgeon: Elam Dutch, MD;  Location: [facility] OR;  Service: Vascular;  Laterality: Right;   APPENDECTOMY     BACK SURGERY     15 years ago lower   BIOPSY  03/10/2021   Procedure: BIOPSY;  Surgeon: Irene Shipper, MD;  Location: [facility] ENDOSCOPY;  Service: Endoscopy;;   CARDIAC CATHETERIZATION  2005   stenting of the vein graft to the posterior lateral per -- Dr. Martinique       CARDIAC CATHETERIZATION  2011   showed right coronary, totally occluded LAD and severe stenosis in both saphenous vein graft to the posterior  lateral and posterior descending  -- stenting of the proximal portion of the SVG to the posterior lateral branch in 2/11   CHOLECYSTECTOMY     COLONOSCOPY     x several - colon polyps   CORONARY ARTERY BYPASS GRAFT  1997    (LIMA to LAD, SVG to PDA, SVG to PL)   ERCP N/A 10/17/2017    Irene Shipper, MD;  [facility] ENDOSCOPY; choledocholithiasis, biliary pancreatitis.  s/p sphincterotomy and stone extraction.     ESOPHAGOGASTRODUODENOSCOPY (EGD) WITH PROPOFOL N/A 09/21/2017   Procedure: ESOPHAGOGASTRODUODENOSCOPY (EGD) WITH PROPOFOL;  Surgeon: Doran Stabler, MD;  Location: [facility] ENDOSCOPY;  Service: Gastroenterology;  Laterality: N/A;   ESOPHAGOGASTRODUODENOSCOPY (EGD) WITH PROPOFOL N/A 03/10/2021   Procedure: ESOPHAGOGASTRODUODENOSCOPY (EGD) WITH PROPOFOL;  Surgeon: Irene Shipper, MD;  Location: [facility] ENDOSCOPY;  Service: Endoscopy;  Laterality: N/A;   EYE SURGERY     ioc for cataract   HERNIA REPAIR     LEFT AND RIGHT HEART CATHETERIZATION WITH CORONARY/GRAFT ANGIOGRAM  09/04/2012   Procedure: LEFT AND RIGHT HEART CATHETERIZATION WITH Beatrix Fetters;  Surgeon: Sherren Mocha, MD;  [facility] CATH LAB;  patent grafts, mod 2ndary pulmonary HTN, elevated LV EDP & PCWP   LEFT HEART CATH AND CORS/GRAFTS ANGIOGRAPHY N/A 06/17/2017   Procedure: LEFT HEART CATH AND CORS/GRAFTS ANGIOGRAPHY;  Surgeon: Martinique, Peter M, MD;  Location: [facility];  Service: Cardiovascular;  Laterality: N/A;   mohs     x several   PARTIAL COLECTOMY     cancerous polyps   stent to heart  last done 5 to 6 yrs ago   x 4   UPPER GI ENDOSCOPY      No Known Allergies  Prior to Admission medications   Medication Sig Start Date End Date Taking? Authorizing Provider  ALPRAZolam (XANAX) 0.25 MG tablet Take 0.25 mg by mouth at bedtime.   Yes [provider]  aspirin 81 MG EC tablet Take 81 mg by mouth at bedtime.   Yes [provider]  atorvastatin (LIPITOR) 20 MG tablet Take 1 tablet (20 mg total)  by mouth daily. 03/02/21 02/25/22 Yes Martinique, Peter M, MD  carvedilol (COREG) 12.5 MG tablet Take 6.25 mg by mouth 2 (two) times daily. 04/08/21  Yes [provider]  Cholecalciferol (VITAMIN D) 50 MCG (2000 UT) tablet Take 2,000 Units by mouth every Monday, Wednesday, and Friday at 8 PM.   Yes [provider]  colchicine 0.6 MG tablet Take 0.6-1.2 mg by mouth See admin instructions. Take 2 tablets (1.2 mg) by mouth at onset of gout attack, may take 1 tablet (0.6 mg) one hour later if still needed. Max 3 tablets in 3 days   Yes [provider]  docusate sodium (COLACE) 100 MG capsule Take 100 mg by mouth daily as  needed for mild constipation.   Yes [provider]  Febuxostat 80 MG TABS Take 40 mg by mouth daily.   Yes [provider]  ferrous sulfate 325 (65 FE) MG tablet Take 325 mg by mouth every Monday, Wednesday, and Friday.   Yes [provider]  fluticasone (FLONASE) 50 MCG/ACT nasal spray Place 1 spray into both nostrils daily as needed for allergies.   Yes [provider]  insulin aspart protamine- aspart (NOVOLOG MIX 70/30) (70-30) 100 UNIT/ML injection Inject 30 Units into the skin daily as needed (as per blood sugar level).   Yes [provider]  latanoprost (XALATAN) 0.005 % ophthalmic solution Place 1 drop into both eyes at bedtime.   Yes [provider]  Multiple Vitamin (MULTIVITAMIN WITH MINERALS) TABS tablet Take 1 tablet by mouth daily.   Yes [provider]  nitroGLYCERIN (NITROSTAT) 0.4 MG SL tablet Place 1 tablet (0.4 mg total) under the tongue every 5 (five) minutes as needed for chest pain (up to 3 doses). 02/20/15  Yes Martinique, Peter M, MD  oxyCODONE-acetaminophen (PERCOCET/ROXICET) 5-325 MG tablet Take 1 tablet by mouth every 6 (six) hours as needed for severe pain. 03/31/21  Yes Medina-Vargas, Monina C, NP  PRESCRIPTION MEDICATION Inhale into the lungs at bedtime. CPAP   Yes [provider]  tamsulosin (FLOMAX) 0.4 MG CAPS capsule Take 0.4 mg by mouth at bedtime.   Yes [provider]  torsemide (DEMADEX) 20 MG tablet Take 80 mg ( 4 tablets) in th morning and 40 mg ( 2 tablets)  in the evening. Patient taking differently: Take 40-60 mg by mouth See admin instructions. Take 60 mg in th morning and 40 mg in the evening. 05/01/21  Yes Martinique, Peter M, MD    Social History   Socioeconomic History   Marital status: Single    Spouse name: Not on file   Number of children: 0   Years of education: Not on file   Highest education level: Not on file  Occupational History   Occupation: retired   ran a golf course  Tobacco Use   Smoking status: Former    Packs/day: 1.50    Years: 35.00    Pack years: 52.50    Types: Cigarettes    Quit date: 10/12/1983    Years since quitting: 37.5   Smokeless tobacco: Never  Vaping Use   Vaping Use: Never used  Substance and Sexual Activity   Alcohol use: Yes    Alcohol/week: 3.0 - 4.0 standard drinks    Types: 3 - 4 Standard drinks or equivalent per week   Drug use: No   Sexual activity: Not Currently    Birth control/protection: None  Other Topics Concern   Not on file  Social History Narrative   Lives in [place] by himself.  Active but does not routinely exercise.   Social Determinants of Health   Financial Resource Strain: Not on file  Food Insecurity: Not on file  Transportation Needs: No Transportation Needs   Lack of Transportation (Medical): No   Lack of Transportation (Non-Medical): No  Physical Activity: Not on file  Stress: Not on file  Social Connections: Not on file  Intimate Partner Violence: Not on file     Family History  Problem Relation Age of Onset   Coronary artery disease Father    Heart attack Father    Coronary artery disease Brother        1/2 brother    Kidney  disease Sister    Cardiomyopathy Mother    Asthma Brother     ROS: Otherwise negative unless mentioned in  HPI  Physical Examination  Vitals:   05/07/21 0346 05/07/21 0827  BP: 121/77 126/76  Pulse: (!) 57 68  Resp: 20   Temp: 97.7 F (36.5 C)   SpO2: 99% 96%   Body mass index is 30.19 kg/m.  General:  WDWN in NAD, sitting up on side of bed eating lunch Gait: Not observed HENT: WNL, normocephalic Pulmonary: normal non-labored breathing  Cardiac: regular rate and rhythm Abdomen: soft, NT/ND, no masses Vascular Exam/Pulses: 2+ femoral pulses bilaterally, no palpable distal pulses. Doppler Popliteal, PT, DP, peroneal signals bilaterally. Feet warm and well perfused. Right 4th toe amputation site and prior 5th toe amputation sites well healed.  Extremities: without ischemic changes, without Gangrene , without cellulitis; with ulceration overlying 5th metatarsal head on plantar aspect of left foot, some maceration and callus surrounding      Excoriations with some weeping from right posterior medial right leg. Xeroform and Kerlix reapplied Musculoskeletal: no muscle wasting or atrophy  Neurologic: A&O X 3;  No focal weakness or paresthesias are detected; speech is fluent/normal Psychiatric:  The pt has Normal affect.   CBC    Component Value Date/Time   WBC 5.2 05/07/2021 0909   RBC 3.24 (L) 05/07/2021 0909   HGB 9.4 (L) 05/07/2021 0909   HGB 11.1 (L) 01/17/2020 1551   HCT 31.2 (L) 05/07/2021 0909   HCT 33.8 (L) 01/17/2020 1551   PLT 104 (L) 05/07/2021 0909   PLT 147 (L) 01/17/2020 1551   MCV 96.3 05/07/2021 0909   MCV 91 01/17/2020 1551   MCH 29.0 05/07/2021 0909   MCHC 30.1 05/07/2021 0909   RDW 17.7 (H) 05/07/2021 0909   RDW 13.5 01/17/2020 1551   LYMPHSABS 1.0 05/04/2021 1931   LYMPHSABS 1.3 01/17/2020 1551   MONOABS 1.2 (H) 05/04/2021 1931   EOSABS 0.2 05/04/2021 1931   EOSABS 0.6 (H) 01/17/2020 1551   BASOSABS 0.1 05/04/2021 1931   BASOSABS 0.1 01/17/2020 1551    BMET    Component Value Date/Time   NA 138 05/07/2021 0909   NA 137 04/27/2021 1136   K 4.1  05/07/2021 0909   CL 100 05/07/2021 0909   CO2 31 05/07/2021 0909   GLUCOSE 153 (H) 05/07/2021 0909   BUN 67 (H) 05/07/2021 0909   BUN 64 (H) 04/27/2021 1136   CREATININE 2.23 (H) 05/07/2021 0909   CREATININE 1.29 12/30/2014 1352   CALCIUM 8.6 (L) 05/07/2021 0909   GFRNONAA 28 (L) 05/07/2021 0909   GFRAA 21.85 03/31/2021 0000    COAGS: Lab Results  Component Value Date   INR 1.5 (H) 03/10/2021   INR 1.1 01/03/2020   INR 1.1 06/09/2017     Non-Invasive Vascular Imaging:   Arterial duplex and ABIs pending  Statin:  Yes.   Beta Blocker:  Yes.   Aspirin:  Yes.   ACEI:  No. ARB:  No. CCB use:  No Other antiplatelets/anticoagulants:  No.    ASSESSMENT/PLAN: This is a 87 y.o. male with peripheral artery disease who presents with new left foot ulcerations. Appears to be a diabetic pressure ulcer. He does have doppler PT/ Dp and Peroneal signals in left foot. I suspect he has underlying tibial disease very similar to his RLE. He also has mixed venous disease of both lower extremities that will benefit from elevation and eventually light compression. Continue local wound care. Elevate legs  when in Bed. He has CKD stage IV with baseline Scr of 2-3 so caution use of contrast dye, however recommend obtaining LLE arteriogram prior to any foot debridement. Will likely need to perform this with combination of dye and CO2 - Order placed for Arterial duplex of LLE and ABI's- results pending - continue Aspirin and statin - Plan will be for Aortogram, Arteriogram of the LLE on Monday 8/1 with Dr. Donzetta Matters - Recommend hydrating over the weekend if he can tolerate it with his current cardiac and pulmonary status which may not be possible - On call MD, Dr. Donzetta Matters will see patient later this afternoon to provide any further recommendations    Karoline Caldwell PA-C [facility] [phone] 05/07/2021  12:07 PM  I have independently interviewed and examined patient and agree with PA  assessment and plan above.  I reviewed his previous ABIs as well as bilateral lower extremity popliteal artery duplexes which demonstrate small bilateral popliteal artery aneurysms.  I also reviewed his previous angiography which was limited to the aorta and the right lower extremity only.  The right leg has some superficial ulceration as does the right foot on the plantar aspect.  Left foot wound much larger although does not appear infected.  Previous ABIs were monophasic would likely benefit from angiography but will get duplex and ABIs prior.  Angiogram possible Monday, August 1.  I discussed this plan with the patient he demonstrates good understanding.  We will follow-up ABIs and duplex  Merel Santoli C. Donzetta Matters, MD [facility] [place] Office: [phone] Pager: [phone]

## 2021-05-07 NOTE — Progress Notes (Addendum)
PROGRESS NOTE    Scott [name]  WUJ:[id number] DOB: 09/12/1933 DOA: 05/04/2021 PCP: Reubin Milan, MD   Brief Narrative: 87 year old with past medical history significant for chronic systolic heart failure, CAD status post CABG stent, hypertension, CKD stage IV, HLD, GI bleed, chronic iron deficiency anemia, gout, thrombocytopenia, PAD who presents to the ER complaining of weight gain, edema worsening shortness of breath.  He was admitted to chronic few weeks ago and left with a weight of 210 pounds.  He has gained 15 pounds.  He has been taking his torsemide 20 mg twice daily.  He increase this to 40 mg twice daily a week ago.  On 7/24 EF is torsemide was increased to 80 mg in the morning and 40 in the afternoon per cardiology note.  Patient admitted with acute on chronic heart failure exacerbation.   Assessment & Plan:   Principal Problem:   Acute exacerbation of CHF (congestive heart failure) (HCC) Active Problems:   Hypertension   Dyslipidemia   Arteriosclerotic coronary artery disease   Diabetes mellitus, type II, insulin dependent (HCC)   OSA (obstructive sleep apnea)   PAD (peripheral artery disease) (HCC)   CKD (chronic kidney disease) stage 4, GFR 15-29 ml/min (HCC)   Iron deficiency anemia   Thrombocytopenia (HCC)   Prolonged QT interval   1-Acute on Chronic Systolic and Diastolic Heart Failure Exacerbation: -Patient presents with creatinine of 2.2, BNP 2626, chest x-ray with pulmonary congestion. -Recent echo 04/2021 ejection fraction 25 to 30% with global hypokinesis, elevated pulmonary systolic pressure. -Dry weight 208--210.  Presented with a weight of 225 -Continue with IV Lasix 80 mg IV twice daily -Cardiology  has been consulted. -Pending output of 1.1 L yesterday Weight : 221---222 Plan to continue with IV lasix and monitor renal function.   2-Hypertension: Lasix.   CAD: Continue with aspirin and statins Beta-blocker discontinued last hospitalization due  to AV block and bradycardia  4-Diabetes type 2 insulin-dependent He is 70/30 as a sliding scale at home SSI  Gout; resume lower dose colchicine due to Renal function.  LE edema, chronic wound.  Appreciate wound care evaluation recommendation.  Patient takes oxycodone PRN. Resume.  Vascular consulted.   Dyslipidemia: Continue with a statin CKD Stage IV: Monitor renal function on IV diuresis  Iron deficiency anemia: Stable Thrombocytopenia chronic, stable.  PAD: Follow-up with Dr. Oneida Alar. GERD: Continue with PPI Abdominal Distension:  [place] ordered.  Obesity;  Needs life style modifications.   Estimated body mass index is 30.19 kg/m as calculated from the following:   Height as of this encounter: 6' (1.829 m).   Weight as of this encounter: 101 kg.   DVT prophylaxis: SCDs Code Status: DNR Family Communication: Care discussed with patient Disposition Plan:  Status is: Inpatient  Remains inpatient appropriate because:IV treatments appropriate due to intensity of illness or inability to take PO  Dispo: The patient is from: Home              Anticipated d/c is to: Home              Patient currently is not medically stable to d/c.   Difficult to place patient No        Consultants:  Cardiology   Procedures:  None  Antimicrobials:  None  Subjective: Still with LE edema, and abdominal distension.   Objective: Vitals:   05/07/21 0346 05/07/21 0827 05/07/21 1310 05/07/21 1645  BP: 121/77 126/76 136/71 135/80  Pulse: (!) 57 68 76 72  Resp: 20     Temp: 97.7 F (36.5 C)  (!) 97.5 F (36.4 C)   TempSrc: Oral  Oral   SpO2: 99% 96% 95% 100%  Weight:      Height:        Intake/Output Summary (Last 24 hours) at 05/07/2021 1712 Last data filed at 05/07/2021 1457 Gross per 24 hour  Intake 846 ml  Output 1350 ml  Net -504 ml    Filed Weights   05/05/21 2148 05/06/21 0332 05/07/21 0023  Weight: 100.5 kg 100.5 kg 101 kg    Examination:  General exam:   NAD Respiratory system: BL crackles.  Cardiovascular system: SS 1, S 2 RRR Gastrointestinal system: BS present, soft, nt Central nervous system: Alert Extremities: BL LE with edema, dressing, open wound  Data Reviewed: I have personally reviewed following labs and imaging studies  CBC: Recent Labs  Lab 05/04/21 1931 05/06/21 0215 05/07/21 0909  WBC 7.3 5.5 5.2  NEUTROABS 4.8  --   --   HGB 10.0* 9.4* 9.4*  HCT 35.2* 31.4* 31.2*  MCV 98.9 96.3 96.3  PLT 122* 125* 104*    Basic Metabolic Panel: Recent Labs  Lab 05/04/21 1931 05/05/21 1328 05/06/21 0215 05/07/21 0909  NA 134* 137 138 138  K 4.4 4.0 4.4 4.1  CL 96* 100 101 100  CO2 27 27 29 31   GLUCOSE 169* 128* 141* 153*  BUN 71* 69* 69* 67*  CREATININE 2.42* 2.13* 2.09* 2.23*  CALCIUM 8.6* 8.5* 8.6* 8.6*  MG  --  2.0  --   --     GFR: Estimated Creatinine Clearance: 29.3 mL/min (A) (by C-G formula based on SCr of 2.23 mg/dL (H)). Liver Function Tests: Recent Labs  Lab 05/04/21 1931  AST 26  ALT 11  ALKPHOS 106  BILITOT 1.0  PROT 7.2  ALBUMIN 2.8*    No results for input(s): LIPASE, AMYLASE in the last 168 hours. No results for input(s): AMMONIA in the last 168 hours. Coagulation Profile: No results for input(s): INR, PROTIME in the last 168 hours. Cardiac Enzymes: No results for input(s): CKTOTAL, CKMB, CKMBINDEX, TROPONINI in the last 168 hours. BNP (last 3 results) No results for input(s): PROBNP in the last 8760 hours. HbA1C: No results for input(s): HGBA1C in the last 72 hours. CBG: Recent Labs  Lab 05/06/21 1659 05/06/21 2103 05/07/21 0609 05/07/21 1158 05/07/21 1601  GLUCAP 219* 147* 107* 150* 157*    Lipid Profile: No results for input(s): CHOL, HDL, LDLCALC, TRIG, CHOLHDL, LDLDIRECT in the last 72 hours. Thyroid Function Tests: No results for input(s): TSH, T4TOTAL, FREET4, T3FREE, THYROIDAB in the last 72 hours. Anemia Panel: No results for input(s): VITAMINB12, FOLATE, FERRITIN,  TIBC, IRON, RETICCTPCT in the last 72 hours. Sepsis Labs: No results for input(s): PROCALCITON, LATICACIDVEN in the last 168 hours.  Recent Results (from the past 240 hour(s))  Resp Panel by RT-PCR (Flu A&B, Covid) Nasopharyngeal Swab     Status: None   Collection Time: 05/05/21 11:34 AM   Specimen: Nasopharyngeal Swab; Nasopharyngeal(NP) swabs in vial transport medium  Result Value Ref Range Status   SARS Coronavirus 2 by RT PCR NEGATIVE NEGATIVE Final    Comment: (NOTE) SARS-CoV-2 target nucleic acids are NOT DETECTED.  The SARS-CoV-2 RNA is generally detectable in upper respiratory specimens during the acute phase of infection. The lowest concentration of SARS-CoV-2 viral copies this assay can detect is 138 copies/mL. A negative result does not preclude SARS-Cov-2 infection and should not be  used as the sole basis for treatment or other patient management decisions. A negative result may occur with  improper specimen collection/handling, submission of specimen other than nasopharyngeal swab, presence of viral mutation(s) within the areas targeted by this assay, and inadequate number of viral copies(<138 copies/mL). A negative result must be combined with clinical observations, patient history, and epidemiological information. The expected result is Negative.  Fact Sheet for Patients:  EntrepreneurPulse.com.au  Fact Sheet for Healthcare Providers:  IncredibleEmployment.be  This test is no t yet approved or cleared by the [place] FDA and  has been authorized for detection and/or diagnosis of SARS-CoV-2 by FDA under an Emergency Use Authorization (EUA). This EUA will remain  in effect (meaning this test can be used) for the duration of the COVID-19 declaration under Section 564(b)(1) of the Act, 21 U.S.C.section 360bbb-3(b)(1), unless the authorization is terminated  or revoked sooner.       Influenza A by PCR NEGATIVE NEGATIVE Final    Influenza B by PCR NEGATIVE NEGATIVE Final    Comment: (NOTE) The Xpert Xpress SARS-CoV-2/FLU/RSV plus assay is intended as an aid in the diagnosis of influenza from Nasopharyngeal swab specimens and should not be used as a sole basis for treatment. Nasal washings and aspirates are unacceptable for Xpert Xpress SARS-CoV-2/FLU/RSV testing.  Fact Sheet for Patients: EntrepreneurPulse.com.au  Fact Sheet for Healthcare Providers: IncredibleEmployment.be  This test is not yet approved or cleared by the [place] FDA and has been authorized for detection and/or diagnosis of SARS-CoV-2 by FDA under an Emergency Use Authorization (EUA). This EUA will remain in effect (meaning this test can be used) for the duration of the COVID-19 declaration under Section 564(b)(1) of the Act, 21 U.S.C. section 360bbb-3(b)(1), unless the authorization is terminated or revoked.  Performed at [facility], [place] [address], [place], [place] [address]           Radiology Studies: No results found.      Scheduled Meds:  ALPRAZolam  0.25 mg Oral QHS   aspirin  81 mg Oral QHS   atorvastatin  20 mg Oral Daily   cholecalciferol  2,000 Units Oral Q M,W,F-2000   colchicine  0.3 mg Oral Daily   febuxostat  40 mg Oral Daily   ferrous sulfate  325 mg Oral Q M,W,F   furosemide  80 mg Intravenous TID   insulin aspart  0-9 Units Subcutaneous TID WC   latanoprost  1 drop Both Eyes QHS   multivitamin with minerals  1 tablet Oral Daily   mupirocin ointment   Nasal BID   sodium chloride flush  3 mL Intravenous Q12H   tamsulosin  0.4 mg Oral QHS   Continuous Infusions:  sodium chloride       LOS: 2 days    Time spent:35 minutes.     Elmarie Shiley, MD [facility]   If 7PM-7AM, please contact night-coverage [email or web address]  05/07/2021, 5:12 PM

## 2021-05-07 NOTE — Evaluation (Signed)
Physical Therapy Evaluation Patient Details Name: Scott [name] MRN: [id number] DOB: [birth year] Today's Date: 05/07/2021   History of Present Illness  87 y.o. male presents to [facility] ED on 05/04/2021 with weight gain, LE edema and SOB. Pt admitted for acute on chronic heart failure exacerbation. PMH includes chronic systolic heart failure, CAD status post CABG stent, hypertension, CKD stage IV, HLD, GI bleed, chronic iron deficiency anemia, gout, thrombocytopenia, PAD.  Clinical Impression  Pt presents to PT with deficits in strength, power, balance, gait, activity tolerance. Pt is limited by activity tolerance and generalized weakness. Pt requires cues for brake use on 4 wheeled walker to maintain safety. Pt demonstrates reduced gait speed and dependence on 4 wheeled walker for support at times. Pt will benefit from continued aggressive mobilization and PT services to improve activity tolerance and restore the pt's prior level of function.    Follow Up Recommendations Home health PT    Equipment Recommendations  None recommended by PT    Recommendations for Other Services       Precautions / Restrictions Precautions Precautions: Fall Precaution Comments: monitor SpO2 Restrictions Weight Bearing Restrictions: No      Mobility  Bed Mobility Overal bed mobility: Modified Independent             General bed mobility comments: increased time    Transfers Overall transfer level: Needs assistance Equipment used: 4-wheeled walker Transfers: Sit to/from Stand Sit to Stand: Min guard         General transfer comment: cues for brake use on Rollator and hand placement  Ambulation/Gait Ambulation/Gait assistance: Supervision Gait Distance (Feet): 120 Feet Assistive device: 4-wheeled walker Gait Pattern/deviations: Step-through pattern Gait velocity: reduced Gait velocity interpretation: <1.8 ft/sec, indicate of risk for recurrent falls General Gait Details: pt with slowed  step-through gait with reduced stride length  Stairs            Wheelchair Mobility    Modified Rankin (Stroke Patients Only)       Balance Overall balance assessment: Needs assistance Sitting-balance support: No upper extremity supported;Feet supported Sitting balance-Leahy Scale: Good     Standing balance support: Single extremity supported;Bilateral upper extremity supported Standing balance-Leahy Scale: Poor Standing balance comment: reliant on UE support of 4 wheeled walker                             Pertinent Vitals/Pain Pain Assessment: No/denies pain    Home Living Family/patient expects to be discharged to:: Private residence Living Arrangements: Alone Available Help at Discharge: Family;Available PRN/intermittently Type of Home: House Home Access: Stairs to enter Entrance Stairs-Rails: Right Entrance Stairs-Number of Steps: 1 Home Layout: One level Home Equipment: Cane - single point;Walker - 4 wheels;Shower seat      Prior Function Level of Independence: Needs assistance   Gait / Transfers Assistance Needed: pt has been ambulating with use of rollator since discharge from SNF  ADL's / Homemaking Assistance Needed: modI with ADLs        Hand Dominance   Dominant Hand: Right    Extremity/Trunk Assessment   Upper Extremity Assessment Upper Extremity Assessment: Generalized weakness    Lower Extremity Assessment Lower Extremity Assessment: Generalized weakness    Cervical / Trunk Assessment Cervical / Trunk Assessment: Kyphotic  Communication   Communication: No difficulties  Cognition Arousal/Alertness: Awake/alert Behavior During Therapy: WFL for tasks assessed/performed Overall Cognitive Status: Within Functional Limits for tasks assessed  General Comments General comments (skin integrity, edema, etc.): pt on RA, 96% sat at rest. Desats to 91% by end of ambulation  with reports of 6/10 DOE    Exercises     Assessment/Plan    PT Assessment Patient needs continued PT services  PT Problem List Decreased balance;Decreased activity tolerance;Decreased mobility       PT Treatment Interventions DME instruction;Gait training;Stair training;Balance training;Therapeutic activities;Functional mobility training;Therapeutic exercise;Patient/family education    PT Goals (Current goals can be found in the Care Plan section)  Acute Rehab PT Goals Patient Stated Goal: to improve activity tolerance and reduce SOB PT Goal Formulation: With patient Time For Goal Achievement: 05/21/21 Potential to Achieve Goals: Good Additional Goals Additional Goal #1: Pt will ambulate for >150' reporting a DOE of 3/10 or less to indicate improved tolerance for household mobility.    Frequency Min 3X/week   Barriers to discharge        Co-evaluation               AM-PAC PT "6 Clicks" Mobility  Outcome Measure Help needed turning from your back to your side while in a flat bed without using bedrails?: None Help needed moving from lying on your back to sitting on the side of a flat bed without using bedrails?: None Help needed moving to and from a bed to a chair (including a wheelchair)?: A Little Help needed standing up from a chair using your arms (e.g., wheelchair or bedside chair)?: A Little Help needed to walk in hospital room?: A Little Help needed climbing 3-5 steps with a railing? : A Little 6 Click Score: 20    End of Session   Activity Tolerance: Patient limited by fatigue Patient left: in chair;with call bell/phone within reach;with chair alarm set Nurse Communication: Mobility status PT Visit Diagnosis: Other abnormalities of gait and mobility (R26.89)    Time: 0950-1020 PT Time Calculation (min) (ACUTE ONLY): 30 min   Charges:   PT Evaluation $PT Eval Low Complexity: [place], PT, DPT Acute Rehabilitation Pager:  [phone]   Zenaida Niece 05/07/2021, 10:42 AM

## 2021-05-07 NOTE — Evaluation (Signed)
Occupational Therapy Evaluation Patient Details Name: Scott [name] MRN: [id number] DOB: Jan 02, 1933 Today's Date: 05/07/2021    History of Present Illness 87 y.o. male presents to [facility] ED on 05/04/2021 with weight gain, LE edema and SOB. Pt admitted for acute on chronic heart failure exacerbation. PMH includes chronic systolic heart failure, CAD status post CABG stent, hypertension, CKD stage IV, HLD, GI bleed, chronic iron deficiency anemia, gout, thrombocytopenia, PAD.   Clinical Impression   PTA patient independent with ADLs using rollator for mobility. Admitted for above and presenting with problem list below, including decreased activity tolerance, strength, balance.  He requires mod assist for transfers, min guard for mobility in room using rolling walker, and up to max assist for LB ADLs.  He will benefit from continued OT services acutely and after dc at [facility] level to optimize independence and safety with ADLs, mobility.     Follow Up Recommendations  Home health OT;Supervision - Intermittent    Equipment Recommendations  None recommended by OT    Recommendations for Other Services       Precautions / Restrictions Precautions Precautions: Fall Precaution Comments: monitor SpO2 Restrictions Weight Bearing Restrictions: No      Mobility Bed Mobility Overal bed mobility: Modified Independent             General bed mobility comments: OOB in recliner upon entry    Transfers Overall transfer level: Needs assistance Equipment used: Rolling walker (2 wheeled) Transfers: Sit to/from Stand Sit to Stand: Mod assist         General transfer comment: mod assist to power up from recliner, cueing for hand placement    Balance Overall balance assessment: Needs assistance Sitting-balance support: No upper extremity supported;Feet supported Sitting balance-Leahy Scale: Good     Standing balance support: Bilateral upper extremity supported;Single extremity supported;During  functional activity Standing balance-Leahy Scale: Poor Standing balance comment: relies on BUE Support during mobility, 1 UE support in standing during ADLs                           ADL either performed or assessed with clinical judgement   ADL Overall ADL's : Needs assistance/impaired     Grooming: Min guard;Wash/dry hands;Brushing hair;Standing           Upper Body Dressing : Set up;Sitting   Lower Body Dressing: Sit to/from stand;Maximal assistance Lower Body Dressing Details (indicate cue type and reason): requires assist to don socks, thread clothing and mod assist to power up from recliner Toilet Transfer: Moderate assistance;Ambulation;RW Toilet Transfer Details (indicate cue type and reason): simulated from reclienr         Functional mobility during ADLs: Moderate assistance;Rolling walker;Cueing for safety General ADL Comments: pt limited by impaired balance, weakness and decreased activity tolerance.     Vision   Vision Assessment?: No apparent visual deficits     Perception     Praxis      Pertinent Vitals/Pain Pain Assessment: No/denies pain     Hand Dominance Right   Extremity/Trunk Assessment Upper Extremity Assessment Upper Extremity Assessment: Generalized weakness   Lower Extremity Assessment Lower Extremity Assessment: Defer to PT evaluation   Cervical / Trunk Assessment Cervical / Trunk Assessment: Kyphotic   Communication Communication Communication: No difficulties   Cognition Arousal/Alertness: Awake/alert Behavior During Therapy: WFL for tasks assessed/performed Overall Cognitive Status: Within Functional Limits for tasks assessed  General Comments  pt on RA, 96% sat at rest. Desats to 91% by end of ambulation with reports of 6/10 DOE    Exercises     Shoulder Instructions      Home Living Family/patient expects to be discharged to:: Private residence Living  Arrangements: Alone Available Help at Discharge: Family;Available PRN/intermittently Type of Home: House Home Access: Stairs to enter [employer] of Steps: 1 Entrance Stairs-Rails: Right Home Layout: One level     Bathroom Shower/Tub: Teacher, early years/pre: Standard Bathroom Accessibility: Yes   Home Equipment: Cane - single point;Walker - 4 wheels;Shower seat          Prior Functioning/Environment Level of Independence: Needs assistance  Gait / Transfers Assistance Needed: pt has been ambulating with use of rollator since discharge from SNF ADL's / [place]: modI with ADLs            OT Problem List: Decreased strength;Impaired balance (sitting and/or standing);Decreased activity tolerance;Increased edema;Decreased knowledge of use of DME or AE;Cardiopulmonary status limiting activity      OT Treatment/Interventions: Self-care/ADL training;Therapeutic exercise;Energy conservation;DME and/or AE instruction;Therapeutic activities;Patient/family education;Balance training    OT Goals(Current goals can be found in the care plan section) Acute Rehab OT Goals Patient Stated Goal: to improve activity tolerance and reduce SOB OT Goal Formulation: With patient Time For Goal Achievement: 05/21/21 Potential to Achieve Goals: Good  OT Frequency: Min 2X/week   Barriers to D/C:            Co-evaluation              AM-PAC OT "6 Clicks" Daily Activity     Outcome Measure Help from another person eating meals?: None Help from another person taking care of personal grooming?: A Little Help from another person toileting, which includes using toliet, bedpan, or urinal?: A Little Help from another person bathing (including washing, rinsing, drying)?: A Lot Help from another person to put on and taking off regular upper body clothing?: A Little Help from another person to put on and taking off regular lower body clothing?: A Lot 6  Click Score: 17   End of Session Equipment Utilized During Treatment: Rolling walker;Gait belt Nurse Communication: Mobility status  Activity Tolerance: Patient tolerated treatment well Patient left: in chair;with chair alarm set;with family/visitor present;with call bell/phone within reach  OT Visit Diagnosis: Unsteadiness on feet (R26.81);Other abnormalities of gait and mobility (R26.89)                Time: 8469-6295 OT Time Calculation (min): 18 min Charges:  OT General Charges $OT Visit: 1 Visit OT Evaluation $OT Eval Moderate Complexity: 1 Mod  Jolaine Artist, OT Acute Rehabilitation Services Pager [phone] Office [phone]   Delight Stare 05/07/2021, 1:27 PM

## 2021-05-08 ENCOUNTER — Ambulatory Visit: Payer: Self-pay

## 2021-05-08 ENCOUNTER — Inpatient Hospital Stay (HOSPITAL_COMMUNITY): Payer: No Typology Code available for payment source

## 2021-05-08 DIAGNOSIS — L97529 Non-pressure chronic ulcer of other part of left foot with unspecified severity: Secondary | ICD-10-CM | POA: Diagnosis not present

## 2021-05-08 DIAGNOSIS — L0390 Cellulitis, unspecified: Secondary | ICD-10-CM

## 2021-05-08 DIAGNOSIS — N184 Chronic kidney disease, stage 4 (severe): Secondary | ICD-10-CM | POA: Diagnosis not present

## 2021-05-08 DIAGNOSIS — I5023 Acute on chronic systolic (congestive) heart failure: Secondary | ICD-10-CM | POA: Diagnosis not present

## 2021-05-08 DIAGNOSIS — I739 Peripheral vascular disease, unspecified: Secondary | ICD-10-CM | POA: Diagnosis not present

## 2021-05-08 LAB — CBC
HCT: 30.6 % — ABNORMAL LOW (ref 39.0–52.0)
Hemoglobin: 9.1 g/dL — ABNORMAL LOW (ref 13.0–17.0)
MCH: 28.9 pg (ref 26.0–34.0)
MCHC: 29.7 g/dL — ABNORMAL LOW (ref 30.0–36.0)
MCV: 97.1 fL (ref 80.0–100.0)
Platelets: 120 10*3/uL — ABNORMAL LOW (ref 150–400)
RBC: 3.15 MIL/uL — ABNORMAL LOW (ref 4.22–5.81)
RDW: 17.8 % — ABNORMAL HIGH (ref 11.5–15.5)
WBC: 5 10*3/uL (ref 4.0–10.5)
nRBC: 0 % (ref 0.0–0.2)

## 2021-05-08 LAB — COMPREHENSIVE METABOLIC PANEL
ALT: 11 U/L (ref 0–44)
AST: 24 U/L (ref 15–41)
Albumin: 2.4 g/dL — ABNORMAL LOW (ref 3.5–5.0)
Alkaline Phosphatase: 85 U/L (ref 38–126)
Anion gap: 10 (ref 5–15)
BUN: 69 mg/dL — ABNORMAL HIGH (ref 8–23)
CO2: 26 mmol/L (ref 22–32)
Calcium: 8.4 mg/dL — ABNORMAL LOW (ref 8.9–10.3)
Chloride: 99 mmol/L (ref 98–111)
Creatinine, Ser: 2.25 mg/dL — ABNORMAL HIGH (ref 0.61–1.24)
GFR, Estimated: 28 mL/min — ABNORMAL LOW (ref >60)
Glucose, Bld: 155 mg/dL — ABNORMAL HIGH (ref 70–99)
Potassium: 4.2 mmol/L (ref 3.5–5.1)
Sodium: 135 mmol/L (ref 135–145)
Total Bilirubin: 0.8 mg/dL (ref 0.3–1.2)
Total Protein: 6.2 g/dL — ABNORMAL LOW (ref 6.5–8.1)

## 2021-05-08 LAB — GLUCOSE, CAPILLARY
Glucose-Capillary: 109 mg/dL — ABNORMAL HIGH (ref 70–99)
Glucose-Capillary: 150 mg/dL — ABNORMAL HIGH (ref 70–99)
Glucose-Capillary: 148 mg/dL — ABNORMAL HIGH (ref 70–99)
Glucose-Capillary: 175 mg/dL — ABNORMAL HIGH (ref 70–99)

## 2021-05-08 LAB — PROTIME-INR
INR: 1.4 — ABNORMAL HIGH (ref 0.8–1.2)
Prothrombin Time: 17.2 seconds — ABNORMAL HIGH (ref 11.4–15.2)

## 2021-05-08 NOTE — Consult Note (Signed)
   [facility] Inpatient Consult   05/08/2021  Scott [name] 1933/09/16 [id number]  [place] [facility] [ACO] Patient: [employer]  Patient is currently active with [facility] for chronic disease management services.  Patient has been engaged by a [facility].  Our community based plan of care has focused on disease management and community resource support.   Came by to speak with patient up with walker in room.  Patient discussed in unit progression meeting for ongoing care needs. Visitor at bedside.  Plan: Continue to follow progress, ongoing medical interventions noted. Made  Inpatient Transition Of Care [TOC] team member to make aware in progression meeting that [facility] following.   Of note, [facility] Care Management services does not replace or interfere with any services that are needed or arranged by inpatient [facility] care management team.  For additional questions or referrals please contact:   Natividad Brood, RN BSN [facility] Liaison  [phone] business mobile phone Toll free office [phone]  Fax number: [phone] [email or web address]

## 2021-05-08 NOTE — Progress Notes (Signed)
ABI's and left lower extremity arterial duplex has been completed. Preliminary results can be found in CV Proc through chart review.   05/08/21 2:48 PM Carlos Levering RVT

## 2021-05-08 NOTE — Progress Notes (Signed)
PROGRESS NOTE    Scott [name]  CWU:[id number] DOB: 1932/12/24 DOA: 05/04/2021 PCP: Reubin Milan, MD   Brief Narrative: 87 year old with past medical history significant for chronic systolic heart failure, CAD status post CABG stent, hypertension, CKD stage IV, HLD, GI bleed, chronic iron deficiency anemia, gout, thrombocytopenia, PAD who presents to the ER complaining of weight gain, edema worsening shortness of breath.  He was admitted to chronic few weeks ago and left with a weight of 210 pounds.  He has gained 15 pounds.  He has been taking his torsemide 20 mg twice daily.  He increase this to 40 mg twice daily a week ago.  On 7/24 EF is torsemide was increased to 80 mg in the morning and 40 in the afternoon per cardiology note.  Patient admitted with acute on chronic heart failure exacerbation.   Assessment & Plan:   Principal Problem:   Acute exacerbation of CHF (congestive heart failure) (HCC) Active Problems:   Hypertension   Dyslipidemia   Arteriosclerotic coronary artery disease   Diabetes mellitus, type II, insulin dependent (HCC)   OSA (obstructive sleep apnea)   PAD (peripheral artery disease) (HCC)   CKD (chronic kidney disease) stage 4, GFR 15-29 ml/min (HCC)   Iron deficiency anemia   Thrombocytopenia (HCC)   Prolonged QT interval   1-Acute on Chronic Systolic and Diastolic Heart Failure Exacerbation: -Patient presents with creatinine of 2.2, BNP 2626, chest x-ray with pulmonary congestion. -Recent echo 04/2021 ejection fraction 25 to 30% with global hypokinesis, elevated pulmonary systolic pressure. -Dry weight 208--210.  Presented with a weight of 225 -Continue with IV Lasix 80 mg IV  TID -Cardiology  has been consulted. -Urine output of 1.7 L yesterday -Weight : 221---222--221 -Plan to continue with IV lasix and monitor renal function.   2-Hypertension: On Lasix.   CAD: Continue with aspirin and statins Beta-blocker discontinued last hospitalization due  to AV block and bradycardia  4-Diabetes type 2 insulin-dependent He is 70/30 as a sliding scale at home SSI  Gout; On lower dose colchicine due to Renal function.   LE edema, chronic wound. Left ulceration left foot.  Appreciate wound care evaluation recommendation.  Continue with oxycodone PRN>  Vascular consulted. Plan for CO2 angiogram  on Monday.  ABI pending.   Dyslipidemia: Continue with a statin.  CKD Stage IV: Monitor renal function on IV diuresis  Iron deficiency anemia: Stable Thrombocytopenia chronic, stable.  PAD: Follow-up with Dr. Oneida Alar. GERD: Continue with PPI Abdominal Distension: possible Cirrhosis by [place].  [place] ordered. Small to moderate ascites  lower quadrant. Plan to continue with IV lasix.   Obesity;  Needs life style modifications.   Estimated body mass index is 30.02 kg/m as calculated from the following:   Height as of this encounter: 6' (1.829 m).   Weight as of this encounter: 100.4 kg.   DVT prophylaxis: SCDs Code Status: DNR Family Communication: Care discussed with patient Disposition Plan:  Status is: Inpatient  Remains inpatient appropriate because:IV treatments appropriate due to intensity of illness or inability to take PO  Dispo: The patient is from: Home              Anticipated d/c is to: Home              Patient currently is not medically stable to d/c.   Difficult to place patient No        Consultants:  Cardiology   Procedures:  None  Antimicrobials:  None  Subjective: He  was having SOB last night, feels some what better this am.    Objective: Vitals:   05/07/21 2205 05/08/21 0100 05/08/21 0947 05/08/21 1106  BP:   125/84 135/76  Pulse: 78  66 72  Resp: 18  20 19   Temp:   (!) 97.3 F (36.3 C) (!) 97.5 F (36.4 C)  TempSrc:   Oral Oral  SpO2: 100%  98% 100%  Weight:  100.4 kg    Height:        Intake/Output Summary (Last 24 hours) at 05/08/2021 1414 Last data filed at 05/08/2021 1244 Gross per 24  hour  Intake 1580 ml  Output 1708 ml  Net -128 ml    Filed Weights   05/06/21 0332 05/07/21 0023 05/08/21 0100  Weight: 100.5 kg 101 kg 100.4 kg    Examination:  General exam:  NAD Respiratory system: BBL crackles.  Cardiovascular system: S 1, S 2 RRR Gastrointestinal system: BS present, soft, nt Central nervous system: Alert, follows command Extremities: BL LE with edema, dressing, left foot open wound  Data Reviewed: I have personally reviewed following labs and imaging studies  CBC: Recent Labs  Lab 05/04/21 1931 05/06/21 0215 05/07/21 0909 05/08/21 0923  WBC 7.3 5.5 5.2 5.0  NEUTROABS 4.8  --   --   --   HGB 10.0* 9.4* 9.4* 9.1*  HCT 35.2* 31.4* 31.2* 30.6*  MCV 98.9 96.3 96.3 97.1  PLT 122* 125* 104* 120*    Basic Metabolic Panel: Recent Labs  Lab 05/04/21 1931 05/05/21 1328 05/06/21 0215 05/07/21 0909 05/08/21 0923  NA 134* 137 138 138 135  K 4.4 4.0 4.4 4.1 4.2  CL 96* 100 101 100 99  CO2 27 27 29 31 26   GLUCOSE 169* 128* 141* 153* 155*  BUN 71* 69* 69* 67* 69*  CREATININE 2.42* 2.13* 2.09* 2.23* 2.25*  CALCIUM 8.6* 8.5* 8.6* 8.6* 8.4*  MG  --  2.0  --   --   --     GFR: Estimated Creatinine Clearance: 28.9 mL/min (A) (by C-G formula based on SCr of 2.25 mg/dL (H)). Liver Function Tests: Recent Labs  Lab 05/04/21 1931 05/08/21 0923  AST 26 24  ALT 11 11  ALKPHOS 106 85  BILITOT 1.0 0.8  PROT 7.2 6.2*  ALBUMIN 2.8* 2.4*    No results for input(s): LIPASE, AMYLASE in the last 168 hours. No results for input(s): AMMONIA in the last 168 hours. Coagulation Profile: Recent Labs  Lab 05/08/21 0923  INR 1.4*   Cardiac Enzymes: No results for input(s): CKTOTAL, CKMB, CKMBINDEX, TROPONINI in the last 168 hours. BNP (last 3 results) No results for input(s): PROBNP in the last 8760 hours. HbA1C: No results for input(s): HGBA1C in the last 72 hours. CBG: Recent Labs  Lab 05/07/21 1158 05/07/21 1601 05/07/21 2105 05/08/21 0559  05/08/21 1108  GLUCAP 150* 157* 164* 109* 150*    Lipid Profile: No results for input(s): CHOL, HDL, LDLCALC, TRIG, CHOLHDL, LDLDIRECT in the last 72 hours. Thyroid Function Tests: No results for input(s): TSH, T4TOTAL, FREET4, T3FREE, THYROIDAB in the last 72 hours. Anemia Panel: No results for input(s): VITAMINB12, FOLATE, FERRITIN, TIBC, IRON, RETICCTPCT in the last 72 hours. Sepsis Labs: No results for input(s): PROCALCITON, LATICACIDVEN in the last 168 hours.  Recent Results (from the past 240 hour(s))  Resp Panel by RT-PCR (Flu A&B, Covid) Nasopharyngeal Swab     Status: None   Collection Time: 05/05/21 11:34 AM   Specimen: Nasopharyngeal Swab; Nasopharyngeal(NP) swabs  in vial transport medium  Result Value Ref Range Status   SARS Coronavirus 2 by RT PCR NEGATIVE NEGATIVE Final    Comment: (NOTE) SARS-CoV-2 target nucleic acids are NOT DETECTED.  The SARS-CoV-2 RNA is generally detectable in upper respiratory specimens during the acute phase of infection. The lowest concentration of SARS-CoV-2 viral copies this assay can detect is 138 copies/mL. A negative result does not preclude SARS-Cov-2 infection and should not be used as the sole basis for treatment or other patient management decisions. A negative result may occur with  improper specimen collection/handling, submission of specimen other than nasopharyngeal swab, presence of viral mutation(s) within the areas targeted by this assay, and inadequate number of viral copies(<138 copies/mL). A negative result must be combined with clinical observations, patient history, and epidemiological information. The expected result is Negative.  Fact Sheet for Patients:  EntrepreneurPulse.com.au  Fact Sheet for Healthcare Providers:  IncredibleEmployment.be  This test is no t yet approved or cleared by the [place] FDA and  has been authorized for detection and/or diagnosis of SARS-CoV-2  by FDA under an Emergency Use Authorization (EUA). This EUA will remain  in effect (meaning this test can be used) for the duration of the COVID-19 declaration under Section 564(b)(1) of the Act, 21 U.S.C.section 360bbb-3(b)(1), unless the authorization is terminated  or revoked sooner.       Influenza A by PCR NEGATIVE NEGATIVE Final   Influenza B by PCR NEGATIVE NEGATIVE Final    Comment: (NOTE) The Xpert Xpress SARS-CoV-2/FLU/RSV plus assay is intended as an aid in the diagnosis of influenza from Nasopharyngeal swab specimens and should not be used as a sole basis for treatment. Nasal washings and aspirates are unacceptable for Xpert Xpress SARS-CoV-2/FLU/RSV testing.  Fact Sheet for Patients: EntrepreneurPulse.com.au  Fact Sheet for Healthcare Providers: IncredibleEmployment.be  This test is not yet approved or cleared by the [place] FDA and has been authorized for detection and/or diagnosis of SARS-CoV-2 by FDA under an Emergency Use Authorization (EUA). This EUA will remain in effect (meaning this test can be used) for the duration of the COVID-19 declaration under Section 564(b)(1) of the Act, 21 U.S.C. section 360bbb-3(b)(1), unless the authorization is terminated or revoked.  Performed at [facility], [place] [address], [place], [place] [address]           Radiology Studies: US Abdomen Limited RUQ (LIVER/GB)  Result Date: 05/07/2021 CLINICAL DATA:  Ascites. EXAM: ULTRASOUND ABDOMEN LIMITED RIGHT UPPER QUADRANT COMPARISON:  Abdominopelvic CT 05/08/2020 FINDINGS: Gallbladder: Surgically absent. Common bile duct: Diameter: 4 mm, normal. Liver: Heterogeneous and mildly increased parenchymal echogenicity. There is slight capsular nodularity. No evidence of focal lesion. Pneumobilia on prior exam is not confidently seen. Portal vein is patent on color Doppler imaging with normal direction of blood flow towards the liver.  Other: Small volume ascites in the right and left upper quadrants, small to moderate ascites in the right and left lower quadrants. IMPRESSION: 1. Slight capsular nodularity and increased hepatic parenchymal echogenicity suggesting cirrhosis. No focal lesion. Pneumobilia on prior CT is not demonstrated on the current exam. 2. Small volume ascites in the upper abdomen, small to moderate ascites in both lower quadrants. 3. Cholecystectomy without biliary dilatation. Electronically Signed   By: Keith Rake M.D.   On: 05/07/2021 19:49        Scheduled Meds:  ALPRAZolam  0.25 mg Oral QHS   aspirin  81 mg Oral QHS   atorvastatin  20 mg Oral Daily  cholecalciferol  2,000 Units Oral Q M,W,F-2000   colchicine  0.3 mg Oral Daily   febuxostat  40 mg Oral Daily   ferrous sulfate  325 mg Oral Q M,W,F   furosemide  80 mg Intravenous TID   insulin aspart  0-9 Units Subcutaneous TID WC   latanoprost  1 drop Both Eyes QHS   multivitamin with minerals  1 tablet Oral Daily   mupirocin ointment   Nasal BID   sodium chloride flush  3 mL Intravenous Q12H   tamsulosin  0.4 mg Oral QHS   Continuous Infusions:  sodium chloride       LOS: 3 days    Time spent:35 minutes.     Elmarie Shiley, MD [facility]   If 7PM-7AM, please contact night-coverage [email or web address]  05/08/2021, 2:14 PM

## 2021-05-08 NOTE — Progress Notes (Signed)
  Progress Note    05/08/2021 8:09 AM * No surgery found *  Subjective: Had some shortness of breath overnight, currently eating breakfast without complaint  Vitals:   05/07/21 2158 05/07/21 2205  BP:    Pulse:  78  Resp:  18  Temp:    SpO2: 100% 100%    Physical Exam: Awake alert oriented Nonlabored respirations Dressings on bilateral lower extremities are clean dry and intact  CBC    Component Value Date/Time   WBC 5.2 05/07/2021 0909   RBC 3.24 (L) 05/07/2021 0909   HGB 9.4 (L) 05/07/2021 0909   HGB 11.1 (L) 01/17/2020 1551   HCT 31.2 (L) 05/07/2021 0909   HCT 33.8 (L) 01/17/2020 1551   PLT 104 (L) 05/07/2021 0909   PLT 147 (L) 01/17/2020 1551   MCV 96.3 05/07/2021 0909   MCV 91 01/17/2020 1551   MCH 29.0 05/07/2021 0909   MCHC 30.1 05/07/2021 0909   RDW 17.7 (H) 05/07/2021 0909   RDW 13.5 01/17/2020 1551   LYMPHSABS 1.0 05/04/2021 1931   LYMPHSABS 1.3 01/17/2020 1551   MONOABS 1.2 (H) 05/04/2021 1931   EOSABS 0.2 05/04/2021 1931   EOSABS 0.6 (H) 01/17/2020 1551   BASOSABS 0.1 05/04/2021 1931   BASOSABS 0.1 01/17/2020 1551    BMET    Component Value Date/Time   NA 138 05/07/2021 0909   NA 137 04/27/2021 1136   K 4.1 05/07/2021 0909   CL 100 05/07/2021 0909   CO2 31 05/07/2021 0909   GLUCOSE 153 (H) 05/07/2021 0909   BUN 67 (H) 05/07/2021 0909   BUN 64 (H) 04/27/2021 1136   CREATININE 2.23 (H) 05/07/2021 0909   CREATININE 1.29 12/30/2014 1352   CALCIUM 8.6 (L) 05/07/2021 0909   GFRNONAA 28 (L) 05/07/2021 0909   GFRAA 21.85 03/31/2021 0000    INR    Component Value Date/Time   INR 1.5 (H) 03/10/2021 1029     Intake/Output Summary (Last 24 hours) at 05/08/2021 0809 Last data filed at 05/08/2021 0537 Gross per 24 hour  Intake 1086 ml  Output 1700 ml  Net -614 ml     Assessment/plan:  87 y.o. male is here with CHF exacerbation.  He has chronic kidney disease.  There is new ulceration on the left foot.  ABIs and left lower extremity duplex  pending.  We will tentatively plan for CO2 angiography on Monday from a right common femoral approach.  I discussed the risk and benefits with the patient he demonstrates good understanding he will be n.p.o. past midnight Sunday   Grigor Lipschutz C. Donzetta Matters, MD [facility] Office: [phone] Pager: [phone]  05/08/2021 8:09 AM

## 2021-05-08 NOTE — TOC Progression Note (Signed)
Transition of Care [facility]) - Progression Note    Patient Details  Name: Scott [name] MRN: [id number] Date of Birth: 06-06-1933  Transition of Care [facility]) CM/SW Contact  Zenon Mayo, RN Phone Number: 05/08/2021, 11:45 AM  Clinical Narrative:     NCM spoke with patient at bedside, he is active with [facility] for [facility], [place], [place], will need resumptions orders.  Patient would like to continue with them.  He states his brother, [name] comes to check on him every day.     Expected Discharge Plan: [place] Barriers to Discharge: Continued Medical Work up  Expected Discharge Plan and Services Expected Discharge Plan: [place]   Discharge Planning Services: CM Consult   Living arrangements for the past 2 months: Single Family Home                   DME Agency: NA       HH Arranged: RN, PT, OT HH Agency: [facility] Date [place]: 05/07/21 Time [place]: 1722 Representative spoke with at [place]: [place] ([place]) Interventions    Readmission Risk Interventions Readmission Risk Prevention Plan 05/07/2021 04/14/2021 04/10/2021  Transportation Screening Complete Complete Complete  PCP or Specialist Appt within 5-7 Days - - -  PCP or Specialist Appt within 3-5 Days - Complete -  Home Care Screening - - -  Medication Review (RN CM) - - -  [place] or [place] - Complete -  Social Work Consult for [place] Planning/Counseling - Complete Complete  Palliative Care Screening - Not Applicable Not Applicable  Medication Review Press photographer) Complete Complete -  [place] or Home Care Consult Complete - -  SW Recovery Care/Counseling Consult Complete - -  Palliative Care Screening Not Applicable - -  [place] Not Applicable - -  Some recent data might be hidden

## 2021-05-08 NOTE — Progress Notes (Signed)
Progress Note  Patient Name: ARICK MARENO Date of Encounter: 05/08/2021  [facility] [address] [facility] Cardiologist: Peter Martinique, MD   Subjective   Mild SOB. Improved. VVS note reviewed  Inpatient Medications    Scheduled Meds:  ALPRAZolam  0.25 mg Oral QHS   aspirin  81 mg Oral QHS   atorvastatin  20 mg Oral Daily   cholecalciferol  2,000 Units Oral Q M,W,F-2000   colchicine  0.3 mg Oral Daily   febuxostat  40 mg Oral Daily   ferrous sulfate  325 mg Oral Q M,W,F   furosemide  80 mg Intravenous TID   insulin aspart  0-9 Units Subcutaneous TID WC   latanoprost  1 drop Both Eyes QHS   multivitamin with minerals  1 tablet Oral Daily   mupirocin ointment   Nasal BID   sodium chloride flush  3 mL Intravenous Q12H   tamsulosin  0.4 mg Oral QHS   Continuous Infusions:  sodium chloride     PRN Meds: sodium chloride, acetaminophen **OR** acetaminophen, docusate sodium, fluticasone, nitroGLYCERIN, oxyCODONE, sodium chloride flush   Vital Signs    Vitals:   05/07/21 1922 05/07/21 2158 05/07/21 2205 05/08/21 0100  BP: 136/72     Pulse: 72  78   Resp: 17  18   Temp: (!) 97.5 F (36.4 C)     TempSrc: Oral     SpO2: 95% 100% 100%   Weight:    100.4 kg  Height:        Intake/Output Summary (Last 24 hours) at 05/08/2021 0944 Last data filed at 05/08/2021 0827 Gross per 24 hour  Intake 963 ml  Output 1700 ml  Net -737 ml   Last 3 Weights 05/08/2021 05/07/2021 05/06/2021  Weight (lbs) 221 lb 5.5 oz 222 lb 9.6 oz 221 lb 9.6 oz  Weight (kg) 100.4 kg 100.971 kg 100.517 kg      Telemetry    Sinus rhythm right bundle branch block, no changes- Personally Reviewed  ECG    Sinus rhythm right bundle branch block first-degree AV block- Personally Reviewed  Physical Exam   GEN: Well nourished, well developed, in no acute distress HEENT: normal Neck: no JVD, carotid bruits, or masses Cardiac: RRR; no murmurs, rubs, or gallops,no edema  Respiratory:  clear to auscultation bilaterally,  normal work of breathing GI: soft, nontender, nondistended, + BS MS: left foot wound.  Skin: warm and dry, no rash Neuro:  Alert and Oriented x 3, Strength and sensation are intact Psych: euthymic mood, full affect   Labs    High Sensitivity Troponin:   Recent Labs  Lab 04/09/21 1208 04/09/21 1408  TROPONINIHS 35* 33*      Chemistry Recent Labs  Lab 05/04/21 1931 05/05/21 1328 05/06/21 0215 05/07/21 0909  NA 134* 137 138 138  K 4.4 4.0 4.4 4.1  CL 96* 100 101 100  CO2 27 27 29 31   GLUCOSE 169* 128* 141* 153*  BUN 71* 69* 69* 67*  CREATININE 2.42* 2.13* 2.09* 2.23*  CALCIUM 8.6* 8.5* 8.6* 8.6*  PROT 7.2  --   --   --   ALBUMIN 2.8*  --   --   --   AST 26  --   --   --   ALT 11  --   --   --   ALKPHOS 106  --   --   --   BILITOT 1.0  --   --   --   GFRNONAA 25* 30* 30*  28*  ANIONGAP 11 10 8 7      Hematology Recent Labs  Lab 05/04/21 1931 05/06/21 0215 05/07/21 0909  WBC 7.3 5.5 5.2  RBC 3.56* 3.26* 3.24*  HGB 10.0* 9.4* 9.4*  HCT 35.2* 31.4* 31.2*  MCV 98.9 96.3 96.3  MCH 28.1 28.8 29.0  MCHC 28.4* 29.9* 30.1  RDW 17.5* 17.6* 17.7*  PLT 122* 125* 104*    BNP Recent Labs  Lab 05/04/21 1931  BNP 2,626.7*     DDimer No results for input(s): DDIMER in the last 168 hours.   Radiology    US Abdomen Limited RUQ (LIVER/GB)  Result Date: 05/07/2021 CLINICAL DATA:  Ascites. EXAM: ULTRASOUND ABDOMEN LIMITED RIGHT UPPER QUADRANT COMPARISON:  Abdominopelvic CT 05/08/2020 FINDINGS: Gallbladder: Surgically absent. Common bile duct: Diameter: 4 mm, normal. Liver: Heterogeneous and mildly increased parenchymal echogenicity. There is slight capsular nodularity. No evidence of focal lesion. Pneumobilia on prior exam is not confidently seen. Portal vein is patent on color Doppler imaging with normal direction of blood flow towards the liver. Other: Small volume ascites in the right and left upper quadrants, small to moderate ascites in the right and left lower  quadrants. IMPRESSION: 1. Slight capsular nodularity and increased hepatic parenchymal echogenicity suggesting cirrhosis. No focal lesion. Pneumobilia on prior CT is not demonstrated on the current exam. 2. Small volume ascites in the upper abdomen, small to moderate ascites in both lower quadrants. 3. Cholecystectomy without biliary dilatation. Electronically Signed   By: Keith Rake M.D.   On: 05/07/2021 19:49    Cardiac Studies    Echo 04/10/21: 1. Left ventricular ejection fraction, by estimation, is 25 to 30%. The  left ventricle has severely decreased function. The left ventricle  demonstrates global hypokinesis. The left ventricular internal cavity size  was moderately dilated. Left  ventricular diastolic parameters are consistent with Grade II diastolic  dysfunction (pseudonormalization). Elevated left ventricular end-diastolic  pressure.   2. Right ventricular systolic function is severely reduced. The right  ventricular size is severely enlarged. There is severely elevated  pulmonary artery systolic pressure.   3. Left atrial size was severely dilated.   4. Right atrial size was moderately dilated.   5. The mitral valve is degenerative. Mild mitral valve regurgitation.   6. Tricuspid valve regurgitation is mild to moderate.   7. The aortic valve is tricuspid. There is mild calcification of the  aortic valve. There is mild thickening of the aortic valve. Aortic valve  regurgitation is mild.  Patient Profile     87 y.o. male with a hx of CAD, chronic systolic and diastolic heart failure, right heart failure, pulmonary hypertension, HTN, HLD, IDDM, IDA, CKD stage IV, OSA on CPAP, and bradycardia who is being seen  for the evaluation of CHF at the request of Dr. Tyrell Antonio  Assessment & Plan    Acute on chronic systolic and diastolic heart failure, biventricular - EF 25 to 30%, right-sided dysfunction as well -On Lasix 80 mg IV 3 times a day.  This was increased from twice a  day on 05/06/2021.  Continue with diuresis.  Weight unchanged. -Diuresis has been difficult given his chronic kidney disease stage IV with drastic alterations in his BUN/creatinine.  Very challenging situation.  Clearly has end-stage heart failure. -Not a candidate for invasive therapies or inotropes.  Coronary artery disease status post CABG - LIMA to LAD SVG to PLA SVG to PDA - Stable no anginal symptoms  Right bundle branch block with first-degree AV block -  Currently not on beta-blocker.  Not symptomatic with this.  No pacemaker needs. No changes  Essential hypertension - Losartan was discontinued secondary to renal dysfunction at prior hospitalization.  Pressure is currently well controlled.  Trying to continue to have adequate pressures for diuresis. stable  Chronic kidney disease stage IV - Serum creatinine around 2-2.5 at baseline.  Continue to monitor closely with aggressive diuresis.  This will make it challenging. -Home doses of torsemide for instance were drastically reduced because of worsening renal function appropriately so.  However at the same time fluid overload took place.  Peripheral arterial disease - Left foot wound noted.  Dr. Donzetta Matters note reviewed. Plan on CO2 angiog on Monday. Wound care nurse has evaluated.  He does see Dr. Oneida Alar with vascular surgery as outpatient.    For questions or updates, please contact [place] Please consult [email or web address] for contact info under        Signed, Candee Furbish, MD  05/08/2021, 9:44 AM

## 2021-05-09 DIAGNOSIS — I2510 Atherosclerotic heart disease of native coronary artery without angina pectoris: Secondary | ICD-10-CM | POA: Diagnosis not present

## 2021-05-09 DIAGNOSIS — N184 Chronic kidney disease, stage 4 (severe): Secondary | ICD-10-CM | POA: Diagnosis not present

## 2021-05-09 DIAGNOSIS — I5023 Acute on chronic systolic (congestive) heart failure: Secondary | ICD-10-CM | POA: Diagnosis not present

## 2021-05-09 DIAGNOSIS — E785 Hyperlipidemia, unspecified: Secondary | ICD-10-CM

## 2021-05-09 DIAGNOSIS — I5043 Acute on chronic combined systolic (congestive) and diastolic (congestive) heart failure: Secondary | ICD-10-CM | POA: Diagnosis not present

## 2021-05-09 LAB — CBC
HCT: 32.9 % — ABNORMAL LOW (ref 39.0–52.0)
Hemoglobin: 9.7 g/dL — ABNORMAL LOW (ref 13.0–17.0)
MCH: 28.6 pg (ref 26.0–34.0)
MCHC: 29.5 g/dL — ABNORMAL LOW (ref 30.0–36.0)
MCV: 97.1 fL (ref 80.0–100.0)
Platelets: 130 10*3/uL — ABNORMAL LOW (ref 150–400)
RBC: 3.39 MIL/uL — ABNORMAL LOW (ref 4.22–5.81)
RDW: 17.5 % — ABNORMAL HIGH (ref 11.5–15.5)
WBC: 5.2 10*3/uL (ref 4.0–10.5)
nRBC: 0 % (ref 0.0–0.2)

## 2021-05-09 LAB — GLUCOSE, CAPILLARY
Glucose-Capillary: 102 mg/dL — ABNORMAL HIGH (ref 70–99)
Glucose-Capillary: 142 mg/dL — ABNORMAL HIGH (ref 70–99)
Glucose-Capillary: 161 mg/dL — ABNORMAL HIGH (ref 70–99)
Glucose-Capillary: 188 mg/dL — ABNORMAL HIGH (ref 70–99)

## 2021-05-09 LAB — BASIC METABOLIC PANEL
Anion gap: 9 (ref 5–15)
BUN: 69 mg/dL — ABNORMAL HIGH (ref 8–23)
CO2: 29 mmol/L (ref 22–32)
Calcium: 8.8 mg/dL — ABNORMAL LOW (ref 8.9–10.3)
Chloride: 99 mmol/L (ref 98–111)
Creatinine, Ser: 2.29 mg/dL — ABNORMAL HIGH (ref 0.61–1.24)
GFR, Estimated: 27 mL/min — ABNORMAL LOW (ref >60)
Glucose, Bld: 97 mg/dL (ref 70–99)
Potassium: 4.1 mmol/L (ref 3.5–5.1)
Sodium: 137 mmol/L (ref 135–145)

## 2021-05-09 MED ORDER — METOLAZONE 5 MG PO TABS
5.0000 mg | ORAL_TABLET | Freq: Once | ORAL | Status: AC
  Administered 2021-05-09: 5 mg via ORAL
  Filled 2021-05-09: qty 1

## 2021-05-09 NOTE — Progress Notes (Signed)
Progress Note  Patient Name: Scott [name] Date of Encounter: 05/09/2021  [facility] Cardiologist: Peter Martinique, MD   Subjective   Still feeling short of breath.  No significant progress with diuresis  Inpatient Medications    Scheduled Meds:  ALPRAZolam  0.25 mg Oral QHS   aspirin  81 mg Oral QHS   atorvastatin  20 mg Oral Daily   cholecalciferol  2,000 Units Oral Q M,W,F-2000   colchicine  0.3 mg Oral Daily   febuxostat  40 mg Oral Daily   ferrous sulfate  325 mg Oral Q M,W,F   furosemide  80 mg Intravenous TID   insulin aspart  0-9 Units Subcutaneous TID WC   latanoprost  1 drop Both Eyes QHS   multivitamin with minerals  1 tablet Oral Daily   mupirocin ointment   Nasal BID   sodium chloride flush  3 mL Intravenous Q12H   tamsulosin  0.4 mg Oral QHS   Continuous Infusions:  sodium chloride     PRN Meds: sodium chloride, acetaminophen **OR** acetaminophen, docusate sodium, fluticasone, nitroGLYCERIN, oxyCODONE, sodium chloride flush   Vital Signs    Vitals:   05/08/21 1106 05/08/21 2000 05/09/21 0414 05/09/21 0827  BP: 135/76 (!) 157/82 126/80 110/86  Pulse: 72 68 67 72  Resp: 19 18 17    Temp: (!) 97.5 F (36.4 C) 97.6 F (36.4 C) (!) 97.4 F (36.3 C)   TempSrc: Oral Oral Oral   SpO2: 100% 100% 100% 97%  Weight:   101 kg   Height:        Intake/Output Summary (Last 24 hours) at 05/09/2021 0901 Last data filed at 05/09/2021 0834 Gross per 24 hour  Intake 1637 ml  Output 1633 ml  Net 4 ml   Last 3 Weights 05/09/2021 05/08/2021 05/07/2021  Weight (lbs) 222 lb 11.2 oz 221 lb 5.5 oz 222 lb 9.6 oz  Weight (kg) 101.016 kg 100.4 kg 100.971 kg      Telemetry    Sinus rhythm.  PVCs.- Personally Reviewed  ECG    N/a - Personally Reviewed  Physical Exam   VS:  BP 110/86 (BP Location: Right Wrist)   Pulse 72   Temp (!) 97.4 F (36.3 C) (Oral)   Resp 17   Ht 6' (1.829 m)   Wt 101 kg   SpO2 97%   BMI 30.20 kg/m  , BMI Body mass index is 30.2  kg/m. GENERAL: Chronically ill-appearing HEENT: Pupils equal round and reactive, fundi not visualized, oral mucosa unremarkable NECK: + jugular venous distention.  JVP 12 mmHg waveform within normal limits, carotid upstroke brisk and symmetric, no bruits LUNGS:  Clear to auscultation bilaterally HEART:  mostly regular with occasional ectopy.  PMI not displaced or sustained,S1 and S2 within normal limits, no S3, no S4, no clicks, no rubs, no murmurs ABD:  Flat, positive bowel sounds normal in frequency in pitch, no bruits, no rebound, no guarding, no midline pulsatile mass, no hepatomegaly, no splenomegaly EXT: Legs wrapped.  2+ lower extremity edema SKIN:  No rashes no nodules NEURO:  Cranial nerves II through XII grossly intact, motor grossly intact throughout PSYCH:  Cognitively intact, oriented to person place and time   Labs    High Sensitivity Troponin:   Recent Labs  Lab 04/09/21 1208 04/09/21 1408  TROPONINIHS 35* 33*      Chemistry Recent Labs  Lab 05/04/21 1931 05/05/21 1328 05/07/21 0909 05/08/21 0923 05/09/21 0729  NA 134*   < >  138 135 137  K 4.4   < > 4.1 4.2 4.1  CL 96*   < > 100 99 99  CO2 27   < > 31 26 29   GLUCOSE 169*   < > 153* 155* 97  BUN 71*   < > 67* 69* 69*  CREATININE 2.42*   < > 2.23* 2.25* 2.29*  CALCIUM 8.6*   < > 8.6* 8.4* 8.8*  PROT 7.2  --   --  6.2*  --   ALBUMIN 2.8*  --   --  2.4*  --   AST 26  --   --  24  --   ALT 11  --   --  11  --   ALKPHOS 106  --   --  85  --   BILITOT 1.0  --   --  0.8  --   GFRNONAA 25*   < > 28* 28* 27*  ANIONGAP 11   < > 7 10 9    < > = values in this interval not displayed.     Hematology Recent Labs  Lab 05/07/21 0909 05/08/21 0923 05/09/21 0729  WBC 5.2 5.0 5.2  RBC 3.24* 3.15* 3.39*  HGB 9.4* 9.1* 9.7*  HCT 31.2* 30.6* 32.9*  MCV 96.3 97.1 97.1  MCH 29.0 28.9 28.6  MCHC 30.1 29.7* 29.5*  RDW 17.7* 17.8* 17.5*  PLT 104* 120* 130*    BNP Recent Labs  Lab 05/04/21 1931  BNP 2,626.7*      DDimer No results for input(s): DDIMER in the last 168 hours.   Radiology    VAS [place] ABI WITH/WO TBI  Result Date: 05/08/2021  LOWER EXTREMITY DOPPLER STUDY Patient Name:  Scott [name]  Date of Exam:   05/08/2021 Medical Rec #: [id number]     Accession #:    [id number] Date of Birth: [birth year]     Patient Gender: M Patient Age:   88Y Exam Location:  [facility] Procedure:      VAS [place] ABI WITH/WO TBI Referring Phys: [id number] Karoline Caldwell --------------------------------------------------------------------------------  Indications: Ulceration. High Risk Factors: Hypertension, Diabetes.  Limitations: Today's exam was limited due to patient positioning, involuntary              patient movement and Poor ultrasound/tissue interface. Comparison Study: 09/24/2020 - ABI - R [place]itzer L [place] w/ Monophasic flow Performing Technologist: Carlos Levering RVT  Examination Guidelines: A complete evaluation includes at minimum, Doppler waveform signals and systolic blood pressure reading at the level of bilateral brachial, anterior tibial, and posterior tibial arteries, when vessel segments are accessible. Bilateral testing is considered an integral part of a complete examination. Photoelectric Plethysmograph (PPG) waveforms and toe systolic pressure readings are included as required and additional duplex testing as needed. Limited examinations for reoccurring indications may be performed as noted.  ABI Findings: +---------+------------------+-----+----------+--------------------+ Right    Rt Pressure (mmHg)IndexWaveform  Comment              +---------+------------------+-----+----------+--------------------+ Brachial 118                    biphasic                       +---------+------------------+-----+----------+--------------------+ PTA      71                0.59 monophasic                     +---------+------------------+-----+----------+--------------------+  DP       254               2.12  monophasic                     +---------+------------------+-----+----------+--------------------+ Great Toe                                 Great toe amputation +---------+------------------+-----+----------+--------------------+ +---------+------------------+-----+----------+-------+ Left     Lt Pressure (mmHg)IndexWaveform  Comment +---------+------------------+-----+----------+-------+ Brachial 120                    biphasic          +---------+------------------+-----+----------+-------+ PTA      74                0.62 monophasic        +---------+------------------+-----+----------+-------+ DP       152               1.27 monophasic        +---------+------------------+-----+----------+-------+ Great Toe0                 0.00                   +---------+------------------+-----+----------+-------+ +-------+-----------+-----------+------------+------------+ ABI/TBIToday's ABIToday's TBIPrevious ABIPrevious TBI +-------+-----------+-----------+------------+------------+ Right  0.59                  [place]                       +-------+-----------+-----------+------------+------------+ Left   1.27                  [place]                       +-------+-----------+-----------+------------+------------+  Summary: Right: Resting right ankle-brachial index indicates moderate right lower extremity arterial disease. 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 Left: Resting left ankle-brachial index is within normal range. No evidence of significant left lower extremity arterial disease. Values are likely falsely elevated due to medial calcification. Unable to obtain TBI due to low amplitude/absent waveforms.  *See table(s) above for measurements and observations.  Electronically signed by Deitra Mayo MD on 05/08/2021 at 5:24:59 PM.    Final    VAS [place] LOWER EXTREMITY ARTERIAL DUPLEX  Result Date: 05/08/2021 LOWER EXTREMITY ARTERIAL DUPLEX STUDY  Patient Name:  Scott [name]  Date of Exam:   05/08/2021 Medical Rec #: [id number]     Accession #:    [id number] Date of Birth: 07/31/1933     Patient Gender: M Patient Age:   88Y Exam Location:  [facility] Procedure:      VAS [place] LOWER EXTREMITY ARTERIAL DUPLEX Referring Phys: [id number] Karoline Caldwell --------------------------------------------------------------------------------  Indications: Ulceration. High Risk Factors: Hypertension, Diabetes.  Current ABI: R 0.59 L 1.27 Limitations: Poor ultrasound/tissue interface, patient positioning, patient              movement Comparison Study: No prior studies. Performing Technologist: Oliver Hum RVT  Examination Guidelines: A complete evaluation includes B-mode imaging, spectral Doppler, color Doppler, and power Doppler as needed of all accessible portions of each vessel. Bilateral testing is considered an integral part of a complete examination. Limited examinations for reoccurring indications may be performed as noted.   +-----------+--------+-----+--------+----------+--------+ LEFT       PSV cm/sRatioStenosisWaveform  Comments +-----------+--------+-----+--------+----------+--------+ CFA Distal 67                   biphasic           +-----------+--------+-----+--------+----------+--------+ DFA        58                   biphasic           +-----------+--------+-----+--------+----------+--------+ SFA Prox   102                  monophasic         +-----------+--------+-----+--------+----------+--------+ SFA Mid    98                   monophasic         +-----------+--------+-----+--------+----------+--------+ SFA Distal 49                   monophasic         +-----------+--------+-----+--------+----------+--------+ POP Prox   31                   monophasic         +-----------+--------+-----+--------+----------+--------+ POP Distal 76                   monophasic          +-----------+--------+-----+--------+----------+--------+ ATA Prox   91                   monophasic         +-----------+--------+-----+--------+----------+--------+ ATA Mid    56                   monophasic         +-----------+--------+-----+--------+----------+--------+ ATA Distal 57                   monophasic         +-----------+--------+-----+--------+----------+--------+ PTA Prox   44                   monophasic         +-----------+--------+-----+--------+----------+--------+ PTA Mid    62                   monophasic         +-----------+--------+-----+--------+----------+--------+ PTA Distal 19                   monophasic         +-----------+--------+-----+--------+----------+--------+ PERO Prox  34                   monophasic         +-----------+--------+-----+--------+----------+--------+ PERO Mid   23                   monophasic         +-----------+--------+-----+--------+----------+--------+ PERO Distal0                    monophasic         +-----------+--------+-----+--------+----------+--------+  Summary: Left: Monophasic waveforms are noted throughout the left arterial system starting in the proximal superficial femoral artery.  See table(s) above for measurements and observations. Electronically signed by Deitra Mayo MD on 05/08/2021 at 5:24:28 PM.    Final    US Abdomen Limited RUQ (LIVER/GB)  Result Date: 05/07/2021 CLINICAL DATA:  Ascites. EXAM: ULTRASOUND ABDOMEN LIMITED RIGHT UPPER QUADRANT COMPARISON:  Abdominopelvic CT 05/08/2020 FINDINGS: Gallbladder:  Surgically absent. Common bile duct: Diameter: 4 mm, normal. Liver: Heterogeneous and mildly increased parenchymal echogenicity. There is slight capsular nodularity. No evidence of focal lesion. Pneumobilia on prior exam is not confidently seen. Portal vein is patent on color Doppler imaging with normal direction of blood flow towards the liver. Other: Small  volume ascites in the right and left upper quadrants, small to moderate ascites in the right and left lower quadrants. IMPRESSION: 1. Slight capsular nodularity and increased hepatic parenchymal echogenicity suggesting cirrhosis. No focal lesion. Pneumobilia on prior CT is not demonstrated on the current exam. 2. Small volume ascites in the upper abdomen, small to moderate ascites in both lower quadrants. 3. Cholecystectomy without biliary dilatation. Electronically Signed   By: Keith Rake M.D.   On: 05/07/2021 19:49    Cardiac Studies   Echo 04/10/21: 1. Left ventricular ejection fraction, by estimation, is 25 to 30%. The  left ventricle has severely decreased function. The left ventricle  demonstrates global hypokinesis. The left ventricular internal cavity size  was moderately dilated. Left  ventricular diastolic parameters are consistent with Grade II diastolic  dysfunction (pseudonormalization). Elevated left ventricular end-diastolic  pressure.   2. Right ventricular systolic function is severely reduced. The right  ventricular size is severely enlarged. There is severely elevated  pulmonary artery systolic pressure.   3. Left atrial size was severely dilated.   4. Right atrial size was moderately dilated.   5. The mitral valve is degenerative. Mild mitral valve regurgitation.   6. Tricuspid valve regurgitation is mild to moderate.   7. The aortic valve is tricuspid. There is mild calcification of the  aortic valve. There is mild thickening of the aortic valve. Aortic valve  regurgitation is mild.   Patient Profile     87 y.o. male with CAD, chronic systolic and diastolic heart failure, pulmonary hypertension, hypertension, hyperlipidemia, diabetes, CKD 4, OSA on CPAP, and PAD admitted with acute on chronic systolic diastolic heart failure.  Assessment & Plan    # Acute on chronic systolic ingestive heart failure: # CKD IV: Biventricular failure.  LV EF 25 to 30%.  His home  torsemide was increased and then he was converted to IV Lasix with increasing doses and still is net even with diuresis.  He had about 1 L of urine output yesterday.  No improvement in his weight or his breathing.  Renal function is stable today.  We will give a dose of metolazone 5 mg prior to his next Lasix dose.  There is no improvement, recommend involving nephrology to see if they have any other ideas.  He is not a candidate for inotropes or advanced heart failure therapies due to age.  Baseline creatinine is 2-2.5.  #CAD status post CABG: # Hyperlipidemia: LIMA to LAD, SVG to PLA, and SVG to PDA.  He currently has no anginal symptoms.  Continue aspirin and atorvastatin.  #Essential hypertension: Losartan has been held due to worsening renal dysfunction.  Blood pressure is stable.  #PAD: He has a left foot ulcer and is going for CO2 angiography on Monday with Dr. Donzetta Matters.  For questions or updates, please contact [place] Please consult [email or web address] for contact info under        Signed, Skeet Latch, MD  05/09/2021, 9:01 AM

## 2021-05-09 NOTE — Progress Notes (Signed)
PROGRESS NOTE    Scott [name]  YBO:[id number] DOB: 1933/02/09 DOA: 05/04/2021 PCP: Reubin Milan, MD   Brief Narrative: 87 year old with past medical history significant for chronic systolic heart failure, CAD status post CABG stent, hypertension, CKD stage IV, HLD, GI bleed, chronic iron deficiency anemia, gout, thrombocytopenia, PAD who presents to the ER complaining of weight gain, edema worsening shortness of breath.  He was admitted to chronic few weeks ago and left with a weight of 210 pounds.  He has gained 15 pounds.  He has been taking his torsemide 20 mg twice daily.  He increase this to 40 mg twice daily a week ago.  On 7/24 EF is torsemide was increased to 80 mg in the morning and 40 in the afternoon per cardiology note.  Patient admitted with acute on chronic heart failure exacerbation.   Assessment & Plan:   Principal Problem:   Acute exacerbation of CHF (congestive heart failure) (HCC) Active Problems:   Hypertension   Dyslipidemia   Arteriosclerotic coronary artery disease   Diabetes mellitus, type II, insulin dependent (HCC)   OSA (obstructive sleep apnea)   PAD (peripheral artery disease) (HCC)   CKD (chronic kidney disease) stage 4, GFR 15-29 ml/min (HCC)   Iron deficiency anemia   Thrombocytopenia (HCC)   Prolonged QT interval   1-Acute on Chronic Systolic and Diastolic Heart Failure Exacerbation: -Patient presents with creatinine of 2.2, BNP 2626, chest x-ray with pulmonary congestion. -Recent echo 04/2021 ejection fraction 25 to 30% with global hypokinesis, elevated pulmonary systolic pressure. -Dry weight 208--210.  Presented with a weight of 225 -Continue with IV Lasix 80 mg IV  TID -Cardiology  has been consulted. -Urine output of 1. L yesterday -Weight : 221---222--221--222 -plan to do metolazone today.  If no improvement will consult nephrology   2-Hypertension: On Lasix.   CAD: Continue with aspirin and statins Beta-blocker discontinued last  hospitalization due to AV block and bradycardia  4-Diabetes type 2 insulin-dependent He is 70/30 as a sliding scale at home SSI  Gout; On lower dose colchicine due to Renal function.   LE edema, chronic wound. Left ulceration left foot.  Appreciate wound care evaluation recommendation.  Continue with oxycodone PRN>  Vascular consulted. Plan for CO2 angiogram  on Monday.  ABI: Right: Resting right ankle-brachial index indicates moderate right lower  extremity arterial disease. Left: Resting left ankle-brachial index is within normal range. No  evidence of significant left lower extremity arterial disease.   Dyslipidemia: Continue with a statin.  CKD Stage IV: Monitor renal function on IV diuresis  Iron deficiency anemia: Stable Thrombocytopenia chronic, stable.  PAD: Follow-up with Dr. Oneida Alar. GERD: Continue with PPI Abdominal Distension: possible Cirrhosis by [place].  [place] ordered. Small to moderate ascites  lower quadrant. Plan to continue with IV lasix.   Obesity;  Needs life style modifications.   Estimated body mass index is 30.2 kg/m as calculated from the following:   Height as of this encounter: 6' (1.829 m).   Weight as of this encounter: 101 kg.   DVT prophylaxis: SCDs Code Status: DNR Family Communication: Care discussed with patient Disposition Plan:  Status is: Inpatient  Remains inpatient appropriate because:IV treatments appropriate due to intensity of illness or inability to take PO  Dispo: The patient is from: Home              Anticipated d/c is to: Home              Patient currently is not  medically stable to d/c.   Difficult to place patient No        Consultants:  Cardiology   Procedures:  None  Antimicrobials:  None  Subjective: He is still SOB, edema same.   Objective: Vitals:   05/08/21 2000 05/09/21 0414 05/09/21 0827 05/09/21 1148  BP: (!) 157/82 126/80 110/86 (!) 127/56  Pulse: 68 67 72 72  Resp: 18 17    Temp: 97.6 F (36.4  C) (!) 97.4 F (36.3 C)    TempSrc: Oral Oral    SpO2: 100% 100% 97% 97%  Weight:  101 kg    Height:        Intake/Output Summary (Last 24 hours) at 05/09/2021 1418 Last data filed at 05/09/2021 1255 Gross per 24 hour  Intake 1497 ml  Output 1475 ml  Net 22 ml    Filed Weights   05/07/21 0023 05/08/21 0100 05/09/21 0414  Weight: 101 kg 100.4 kg 101 kg    Examination:  General exam: NAD Respiratory system: BL crackles.  Cardiovascular system: S 1, S 2 RRR Gastrointestinal system: BS present, soft, nt Central nervous system: alert Extremities: BL LE with edema, dressing, left foot open wound  Data Reviewed: I have personally reviewed following labs and imaging studies  CBC: Recent Labs  Lab 05/04/21 1931 05/06/21 0215 05/07/21 0909 05/08/21 0923 05/09/21 0729  WBC 7.3 5.5 5.2 5.0 5.2  NEUTROABS 4.8  --   --   --   --   HGB 10.0* 9.4* 9.4* 9.1* 9.7*  HCT 35.2* 31.4* 31.2* 30.6* 32.9*  MCV 98.9 96.3 96.3 97.1 97.1  PLT 122* 125* 104* 120* 130*    Basic Metabolic Panel: Recent Labs  Lab 05/05/21 1328 05/06/21 0215 05/07/21 0909 05/08/21 0923 05/09/21 0729  NA 137 138 138 135 137  K 4.0 4.4 4.1 4.2 4.1  CL 100 101 100 99 99  CO2 27 29 31 26 29   GLUCOSE 128* 141* 153* 155* 97  BUN 69* 69* 67* 69* 69*  CREATININE 2.13* 2.09* 2.23* 2.25* 2.29*  CALCIUM 8.5* 8.6* 8.6* 8.4* 8.8*  MG 2.0  --   --   --   --     GFR: Estimated Creatinine Clearance: 28.5 mL/min (A) (by C-G formula based on SCr of 2.29 mg/dL (H)). Liver Function Tests: Recent Labs  Lab 05/04/21 1931 05/08/21 0923  AST 26 24  ALT 11 11  ALKPHOS 106 85  BILITOT 1.0 0.8  PROT 7.2 6.2*  ALBUMIN 2.8* 2.4*    No results for input(s): LIPASE, AMYLASE in the last 168 hours. No results for input(s): AMMONIA in the last 168 hours. Coagulation Profile: Recent Labs  Lab 05/08/21 0923  INR 1.4*    Cardiac Enzymes: No results for input(s): CKTOTAL, CKMB, CKMBINDEX, TROPONINI in the last  168 hours. BNP (last 3 results) No results for input(s): PROBNP in the last 8760 hours. HbA1C: No results for input(s): HGBA1C in the last 72 hours. CBG: Recent Labs  Lab 05/08/21 1108 05/08/21 1649 05/08/21 2113 05/09/21 0601 05/09/21 1102  GLUCAP 150* 148* 175* 102* 142*    Lipid Profile: No results for input(s): CHOL, HDL, LDLCALC, TRIG, CHOLHDL, LDLDIRECT in the last 72 hours. Thyroid Function Tests: No results for input(s): TSH, T4TOTAL, FREET4, T3FREE, THYROIDAB in the last 72 hours. Anemia Panel: No results for input(s): VITAMINB12, FOLATE, FERRITIN, TIBC, IRON, RETICCTPCT in the last 72 hours. Sepsis Labs: No results for input(s): PROCALCITON, LATICACIDVEN in the last 168 hours.  Recent Results (  from the past 240 hour(s))  Resp Panel by RT-PCR (Flu A&B, Covid) Nasopharyngeal Swab     Status: None   Collection Time: 05/05/21 11:34 AM   Specimen: Nasopharyngeal Swab; Nasopharyngeal(NP) swabs in vial transport medium  Result Value Ref Range Status   SARS Coronavirus 2 by RT PCR NEGATIVE NEGATIVE Final    Comment: (NOTE) SARS-CoV-2 target nucleic acids are NOT DETECTED.  The SARS-CoV-2 RNA is generally detectable in upper respiratory specimens during the acute phase of infection. The lowest concentration of SARS-CoV-2 viral copies this assay can detect is 138 copies/mL. A negative result does not preclude SARS-Cov-2 infection and should not be used as the sole basis for treatment or other patient management decisions. A negative result may occur with  improper specimen collection/handling, submission of specimen other than nasopharyngeal swab, presence of viral mutation(s) within the areas targeted by this assay, and inadequate number of viral copies(<138 copies/mL). A negative result must be combined with clinical observations, patient history, and epidemiological information. The expected result is Negative.  Fact Sheet for Patients:   EntrepreneurPulse.com.au  Fact Sheet for Healthcare Providers:  IncredibleEmployment.be  This test is no t yet approved or cleared by the [place] FDA and  has been authorized for detection and/or diagnosis of SARS-CoV-2 by FDA under an Emergency Use Authorization (EUA). This EUA will remain  in effect (meaning this test can be used) for the duration of the COVID-19 declaration under Section 564(b)(1) of the Act, 21 U.S.C.section 360bbb-3(b)(1), unless the authorization is terminated  or revoked sooner.       Influenza A by PCR NEGATIVE NEGATIVE Final   Influenza B by PCR NEGATIVE NEGATIVE Final    Comment: (NOTE) The Xpert Xpress SARS-CoV-2/FLU/RSV plus assay is intended as an aid in the diagnosis of influenza from Nasopharyngeal swab specimens and should not be used as a sole basis for treatment. Nasal washings and aspirates are unacceptable for Xpert Xpress SARS-CoV-2/FLU/RSV testing.  Fact Sheet for Patients: EntrepreneurPulse.com.au  Fact Sheet for Healthcare Providers: IncredibleEmployment.be  This test is not yet approved or cleared by the [place] FDA and has been authorized for detection and/or diagnosis of SARS-CoV-2 by FDA under an Emergency Use Authorization (EUA). This EUA will remain in effect (meaning this test can be used) for the duration of the COVID-19 declaration under Section 564(b)(1) of the Act, 21 U.S.C. section 360bbb-3(b)(1), unless the authorization is terminated or revoked.  Performed at [facility], [place] [address], [place], [place] [address]           Radiology Studies: VAS [place] ABI WITH/WO TBI  Result Date: 05/08/2021  LOWER EXTREMITY DOPPLER STUDY Patient Name:  JORAN KALLAL  Date of Exam:   05/08/2021 Medical Rec #: [id number]     Accession #:    [id number] Date of Birth: 12/22/1932     Patient Gender: M Patient Age:   88Y Exam Location:  [facility] Procedure:      VAS [place] ABI WITH/WO TBI Referring Phys: [id number] Karoline Caldwell --------------------------------------------------------------------------------  Indications: Ulceration. High Risk Factors: Hypertension, Diabetes.  Limitations: Today's exam was limited due to patient positioning, involuntary              patient movement and Poor ultrasound/tissue interface. Comparison Study: 09/24/2020 - ABI - R [place] L [place] w/ Monophasic flow Performing Technologist: Carlos Levering RVT  Examination Guidelines: A complete evaluation includes at minimum, Doppler waveform signals and systolic blood pressure reading at the level of bilateral  brachial, anterior tibial, and posterior tibial arteries, when vessel segments are accessible. Bilateral testing is considered an integral part of a complete examination. Photoelectric Plethysmograph (PPG) waveforms and toe systolic pressure readings are included as required and additional duplex testing as needed. Limited examinations for reoccurring indications may be performed as noted.  ABI Findings: +---------+------------------+-----+----------+--------------------+ Right    Rt Pressure (mmHg)IndexWaveform  Comment              +---------+------------------+-----+----------+--------------------+ Brachial 118                    biphasic                       +---------+------------------+-----+----------+--------------------+ PTA      71                0.59 monophasic                     +---------+------------------+-----+----------+--------------------+ DP       254               2.12 monophasic                     +---------+------------------+-----+----------+--------------------+ Great Toe                                 Great toe amputation +---------+------------------+-----+----------+--------------------+ +---------+------------------+-----+----------+-------+ Left     Lt Pressure (mmHg)IndexWaveform  Comment  +---------+------------------+-----+----------+-------+ Brachial 120                    biphasic          +---------+------------------+-----+----------+-------+ PTA      74                0.62 monophasic        +---------+------------------+-----+----------+-------+ DP       152               1.27 monophasic        +---------+------------------+-----+----------+-------+ Great Toe0                 0.00                   +---------+------------------+-----+----------+-------+ +-------+-----------+-----------+------------+------------+ ABI/TBIToday's ABIToday's TBIPrevious ABIPrevious TBI +-------+-----------+-----------+------------+------------+ Right  0.59                  [place]                       +-------+-----------+-----------+------------+------------+ Left   1.27                  [place]                       +-------+-----------+-----------+------------+------------+  Summary: Right: Resting right ankle-brachial index indicates moderate right lower extremity arterial disease. 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 Left: Resting left ankle-brachial index is within normal range. No evidence of significant left lower extremity arterial disease. Values are likely falsely elevated due to medial calcification. Unable to obtain TBI due to low amplitude/absent waveforms.  *See table(s) above for measurements and observations.  Electronically signed by Deitra Mayo MD on 05/08/2021 at 5:24:59 PM.    Final    VAS [place] LOWER EXTREMITY ARTERIAL DUPLEX  Result Date: 05/08/2021 LOWER EXTREMITY ARTERIAL DUPLEX STUDY Patient Name:  TINY  H Guercio  Date of Exam:   05/08/2021 Medical Rec #: [id number]     Accession #:    [id number] Date of Birth: 1932-11-20     Patient Gender: M Patient Age:   88Y Exam Location:  [facility] Procedure:      VAS [place] LOWER EXTREMITY ARTERIAL DUPLEX Referring Phys: [id number] Karoline Caldwell  --------------------------------------------------------------------------------  Indications: Ulceration. High Risk Factors: Hypertension, Diabetes.  Current ABI: R 0.59 L 1.27 Limitations: Poor ultrasound/tissue interface, patient positioning, patient              movement Comparison Study: No prior studies. Performing Technologist: Oliver Hum RVT  Examination Guidelines: A complete evaluation includes B-mode imaging, spectral Doppler, color Doppler, and power Doppler as needed of all accessible portions of each vessel. Bilateral testing is considered an integral part of a complete examination. Limited examinations for reoccurring indications may be performed as noted.   +-----------+--------+-----+--------+----------+--------+ LEFT       PSV cm/sRatioStenosisWaveform  Comments +-----------+--------+-----+--------+----------+--------+ CFA Distal 67                   biphasic           +-----------+--------+-----+--------+----------+--------+ DFA        58                   biphasic           +-----------+--------+-----+--------+----------+--------+ SFA Prox   102                  monophasic         +-----------+--------+-----+--------+----------+--------+ SFA Mid    98                   monophasic         +-----------+--------+-----+--------+----------+--------+ SFA Distal 49                   monophasic         +-----------+--------+-----+--------+----------+--------+ POP Prox   31                   monophasic         +-----------+--------+-----+--------+----------+--------+ POP Distal 76                   monophasic         +-----------+--------+-----+--------+----------+--------+ ATA Prox   91                   monophasic         +-----------+--------+-----+--------+----------+--------+ ATA Mid    56                   monophasic         +-----------+--------+-----+--------+----------+--------+ ATA Distal 57                    monophasic         +-----------+--------+-----+--------+----------+--------+ PTA Prox   44                   monophasic         +-----------+--------+-----+--------+----------+--------+ PTA Mid    62                   monophasic         +-----------+--------+-----+--------+----------+--------+ PTA Distal 19                   monophasic         +-----------+--------+-----+--------+----------+--------+  PERO Prox  34                   monophasic         +-----------+--------+-----+--------+----------+--------+ PERO Mid   23                   monophasic         +-----------+--------+-----+--------+----------+--------+ PERO Distal0                    monophasic         +-----------+--------+-----+--------+----------+--------+  Summary: Left: Monophasic waveforms are noted throughout the left arterial system starting in the proximal superficial femoral artery.  See table(s) above for measurements and observations. Electronically signed by Deitra Mayo MD on 05/08/2021 at 5:24:28 PM.    Final    US Abdomen Limited RUQ (LIVER/GB)  Result Date: 05/07/2021 CLINICAL DATA:  Ascites. EXAM: ULTRASOUND ABDOMEN LIMITED RIGHT UPPER QUADRANT COMPARISON:  Abdominopelvic CT 05/08/2020 FINDINGS: Gallbladder: Surgically absent. Common bile duct: Diameter: 4 mm, normal. Liver: Heterogeneous and mildly increased parenchymal echogenicity. There is slight capsular nodularity. No evidence of focal lesion. Pneumobilia on prior exam is not confidently seen. Portal vein is patent on color Doppler imaging with normal direction of blood flow towards the liver. Other: Small volume ascites in the right and left upper quadrants, small to moderate ascites in the right and left lower quadrants. IMPRESSION: 1. Slight capsular nodularity and increased hepatic parenchymal echogenicity suggesting cirrhosis. No focal lesion. Pneumobilia on prior CT is not demonstrated on the current exam. 2. Small  volume ascites in the upper abdomen, small to moderate ascites in both lower quadrants. 3. Cholecystectomy without biliary dilatation. Electronically Signed   By: Keith Rake M.D.   On: 05/07/2021 19:49        Scheduled Meds:  ALPRAZolam  0.25 mg Oral QHS   aspirin  81 mg Oral QHS   atorvastatin  20 mg Oral Daily   cholecalciferol  2,000 Units Oral Q M,W,F-2000   colchicine  0.3 mg Oral Daily   febuxostat  40 mg Oral Daily   ferrous sulfate  325 mg Oral Q M,W,F   furosemide  80 mg Intravenous TID   insulin aspart  0-9 Units Subcutaneous TID WC   latanoprost  1 drop Both Eyes QHS   metolazone  5 mg Oral Once   multivitamin with minerals  1 tablet Oral Daily   mupirocin ointment   Nasal BID   sodium chloride flush  3 mL Intravenous Q12H   tamsulosin  0.4 mg Oral QHS   Continuous Infusions:  sodium chloride       LOS: 4 days    Time spent:35 minutes.     Elmarie Shiley, MD [facility]   If 7PM-7AM, please contact night-coverage [email or web address]  05/09/2021, 2:18 PM

## 2021-05-09 NOTE — Progress Notes (Signed)
Placed patient on CPAP for the night via auto-mode with oxygen set at 2lpm  

## 2021-05-10 DIAGNOSIS — I5043 Acute on chronic combined systolic (congestive) and diastolic (congestive) heart failure: Secondary | ICD-10-CM | POA: Diagnosis not present

## 2021-05-10 DIAGNOSIS — I5023 Acute on chronic systolic (congestive) heart failure: Secondary | ICD-10-CM | POA: Diagnosis not present

## 2021-05-10 DIAGNOSIS — N184 Chronic kidney disease, stage 4 (severe): Secondary | ICD-10-CM | POA: Diagnosis not present

## 2021-05-10 DIAGNOSIS — I2510 Atherosclerotic heart disease of native coronary artery without angina pectoris: Secondary | ICD-10-CM | POA: Diagnosis not present

## 2021-05-10 LAB — BASIC METABOLIC PANEL
Anion gap: 11 (ref 5–15)
BUN: 72 mg/dL — ABNORMAL HIGH (ref 8–23)
CO2: 29 mmol/L (ref 22–32)
Calcium: 8.9 mg/dL (ref 8.9–10.3)
Chloride: 96 mmol/L — ABNORMAL LOW (ref 98–111)
Creatinine, Ser: 2.24 mg/dL — ABNORMAL HIGH (ref 0.61–1.24)
GFR, Estimated: 28 mL/min — ABNORMAL LOW (ref >60)
Glucose, Bld: 126 mg/dL — ABNORMAL HIGH (ref 70–99)
Potassium: 4.3 mmol/L (ref 3.5–5.1)
Sodium: 136 mmol/L (ref 135–145)

## 2021-05-10 LAB — CBC
HCT: 33.4 % — ABNORMAL LOW (ref 39.0–52.0)
Hemoglobin: 9.9 g/dL — ABNORMAL LOW (ref 13.0–17.0)
MCH: 28.4 pg (ref 26.0–34.0)
MCHC: 29.6 g/dL — ABNORMAL LOW (ref 30.0–36.0)
MCV: 96 fL (ref 80.0–100.0)
Platelets: 140 10*3/uL — ABNORMAL LOW (ref 150–400)
RBC: 3.48 MIL/uL — ABNORMAL LOW (ref 4.22–5.81)
RDW: 17.5 % — ABNORMAL HIGH (ref 11.5–15.5)
WBC: 6.1 10*3/uL (ref 4.0–10.5)
nRBC: 0 % (ref 0.0–0.2)

## 2021-05-10 LAB — GLUCOSE, CAPILLARY
Glucose-Capillary: 110 mg/dL — ABNORMAL HIGH (ref 70–99)
Glucose-Capillary: 162 mg/dL — ABNORMAL HIGH (ref 70–99)
Glucose-Capillary: 147 mg/dL — ABNORMAL HIGH (ref 70–99)
Glucose-Capillary: 166 mg/dL — ABNORMAL HIGH (ref 70–99)

## 2021-05-10 MED ORDER — FUROSEMIDE 10 MG/ML IJ SOLN
15.0000 mg/h | INTRAVENOUS | Status: DC
  Administered 2021-05-10 – 2021-05-12 (×4): 10 mg/h via INTRAVENOUS
  Administered 2021-05-13 – 2021-05-19 (×10): 15 mg/h via INTRAVENOUS
  Filled 2021-05-10 (×19): qty 20

## 2021-05-10 MED ORDER — FUROSEMIDE 10 MG/ML IJ SOLN
80.0000 mg | Freq: Once | INTRAMUSCULAR | Status: AC
  Administered 2021-05-10: 80 mg via INTRAVENOUS

## 2021-05-10 NOTE — Progress Notes (Signed)
PROGRESS NOTE    Scott [name]  CVE:[id number] DOB: 1932-11-05 DOA: 05/04/2021 PCP: Reubin Milan, MD   Brief Narrative: 87 year old with past medical history significant for chronic systolic heart failure, CAD status post CABG stent, hypertension, CKD stage IV, HLD, GI bleed, chronic iron deficiency anemia, gout, thrombocytopenia, PAD who presents to the ER complaining of weight gain, edema worsening shortness of breath.  He was admitted to chronic few weeks ago and left with a weight of 210 pounds.  He has gained 15 pounds.  He has been taking his torsemide 20 mg twice daily.  He increase this to 40 mg twice daily a week ago.  On 7/24 EF is torsemide was increased to 80 mg in the morning and 40 in the afternoon per cardiology note.  Patient admitted with acute on chronic heart failure exacerbation.   Assessment & Plan:   Principal Problem:   Acute exacerbation of CHF (congestive heart failure) (HCC) Active Problems:   Hypertension   Dyslipidemia   Arteriosclerotic coronary artery disease   Diabetes mellitus, type II, insulin dependent (HCC)   OSA (obstructive sleep apnea)   PAD (peripheral artery disease) (HCC)   CKD (chronic kidney disease) stage 4, GFR 15-29 ml/min (HCC)   Iron deficiency anemia   Thrombocytopenia (HCC)   Prolonged QT interval   1-Acute on Chronic Systolic and Diastolic Heart Failure Exacerbation: -Patient presents with creatinine of 2.2, BNP 2626, chest x-ray with pulmonary congestion. -Recent echo 04/2021 ejection fraction 25 to 30% with global hypokinesis, elevated pulmonary systolic pressure. -Dry weight 208--210.  Presented with a weight of 225 -on IV Lasix 80 mg IV  TID -Cardiology  has been consulted. -Weight : 221---222--221--222 -received metolazone 7/30. No significant urine out put.  Nephrologist consulted.   2-Hypertension: On Lasix.   CAD: Continue with aspirin and statins Beta-blocker discontinued last hospitalization due to AV block and  bradycardia  4-Diabetes type 2 insulin-dependent He is 70/30 as a sliding scale at home SSI  Gout; On lower dose colchicine due to Renal function.   LE edema, chronic wound. Left ulceration left foot.  Appreciate wound care evaluation recommendation.  Continue with oxycodone PRN>  Vascular consulted. Plan for CO2 angiogram  on Monday.  ABI: Right: Resting right ankle-brachial index indicates moderate right lower  extremity arterial disease. Left: Resting left ankle-brachial index is within normal range. No  evidence of significant left lower extremity arterial disease.   Dyslipidemia: Continue with a statin.  CKD Stage IV: Monitor renal function on IV diuresis.  Iron deficiency anemia: Stable.  Thrombocytopenia chronic, stable.   PAD: Follow-up with Dr. Oneida Alar.  GERD: Continue with PPI.  Abdominal Distension: possible Cirrhosis by [place].  [place] ordered. Small to moderate ascites  lower quadrant. Plan to continue with IV lasix.   Obesity;  Needs life style modifications.   Estimated body mass index is 29.93 kg/m as calculated from the following:   Height as of this encounter: 6' (1.829 m).   Weight as of this encounter: 100.1 kg.   DVT prophylaxis: SCDs Code Status: DNR Family Communication: Care discussed with patient Disposition Plan:  Status is: Inpatient  Remains inpatient appropriate because:IV treatments appropriate due to intensity of illness or inability to take PO  Dispo: The patient is from: Home              Anticipated d/c is to: Home              Patient currently is not medically stable to d/c.  Difficult to place patient No        Consultants:  Cardiology   Procedures:  None  Antimicrobials:  None  Subjective: Still with SOB, worsening abdominal distension.   Objective: Vitals:   05/09/21 2010 05/09/21 2322 05/10/21 0436 05/10/21 0905  BP: 134/75  117/72 119/79  Pulse: 73 81 82 79  Resp: 17 18 20 20   Temp: 97.6 F (36.4 C)  98.2 F  (36.8 C)   TempSrc: Oral  Oral   SpO2: 99% 98% 98% 100%  Weight:   100.1 kg   Height:        Intake/Output Summary (Last 24 hours) at 05/10/2021 1043 Last data filed at 05/10/2021 1016 Gross per 24 hour  Intake 1197 ml  Output 1050 ml  Net 147 ml    Filed Weights   05/08/21 0100 05/09/21 0414 05/10/21 0436  Weight: 100.4 kg 101 kg 100.1 kg    Examination:  General exam: NAD Respiratory system: BL crackles.  Cardiovascular system: S 1, S 2 RRR Gastrointestinal system: BS present, soft, nt Central nervous system: Alert Extremities: BL LE with edema, dressing, left foot open wound  Data Reviewed: I have personally reviewed following labs and imaging studies  CBC: Recent Labs  Lab 05/04/21 1931 05/06/21 0215 05/07/21 0909 05/08/21 0923 05/09/21 0729 05/10/21 0300  WBC 7.3 5.5 5.2 5.0 5.2 6.1  NEUTROABS 4.8  --   --   --   --   --   HGB 10.0* 9.4* 9.4* 9.1* 9.7* 9.9*  HCT 35.2* 31.4* 31.2* 30.6* 32.9* 33.4*  MCV 98.9 96.3 96.3 97.1 97.1 96.0  PLT 122* 125* 104* 120* 130* 140*    Basic Metabolic Panel: Recent Labs  Lab 05/05/21 1328 05/06/21 0215 05/07/21 0909 05/08/21 0923 05/09/21 0729 05/10/21 0300  NA 137 138 138 135 137 136  K 4.0 4.4 4.1 4.2 4.1 4.3  CL 100 101 100 99 99 96*  CO2 27 29 31 26 29 29   GLUCOSE 128* 141* 153* 155* 97 126*  BUN 69* 69* 67* 69* 69* 72*  CREATININE 2.13* 2.09* 2.23* 2.25* 2.29* 2.24*  CALCIUM 8.5* 8.6* 8.6* 8.4* 8.8* 8.9  MG 2.0  --   --   --   --   --     GFR: Estimated Creatinine Clearance: 29 mL/min (A) (by C-G formula based on SCr of 2.24 mg/dL (H)). Liver Function Tests: Recent Labs  Lab 05/04/21 1931 05/08/21 0923  AST 26 24  ALT 11 11  ALKPHOS 106 85  BILITOT 1.0 0.8  PROT 7.2 6.2*  ALBUMIN 2.8* 2.4*    No results for input(s): LIPASE, AMYLASE in the last 168 hours. No results for input(s): AMMONIA in the last 168 hours. Coagulation Profile: Recent Labs  Lab 05/08/21 0923  INR 1.4*    Cardiac  Enzymes: No results for input(s): CKTOTAL, CKMB, CKMBINDEX, TROPONINI in the last 168 hours. BNP (last 3 results) No results for input(s): PROBNP in the last 8760 hours. HbA1C: No results for input(s): HGBA1C in the last 72 hours. CBG: Recent Labs  Lab 05/09/21 0601 05/09/21 1102 05/09/21 1551 05/09/21 2106 05/10/21 0608  GLUCAP 102* 142* 161* 188* 110*    Lipid Profile: No results for input(s): CHOL, HDL, LDLCALC, TRIG, CHOLHDL, LDLDIRECT in the last 72 hours. Thyroid Function Tests: No results for input(s): TSH, T4TOTAL, FREET4, T3FREE, THYROIDAB in the last 72 hours. Anemia Panel: No results for input(s): VITAMINB12, FOLATE, FERRITIN, TIBC, IRON, RETICCTPCT in the last 72 hours. Sepsis Labs:  No results for input(s): PROCALCITON, LATICACIDVEN in the last 168 hours.  Recent Results (from the past 240 hour(s))  Resp Panel by RT-PCR (Flu A&B, Covid) Nasopharyngeal Swab     Status: None   Collection Time: 05/05/21 11:34 AM   Specimen: Nasopharyngeal Swab; Nasopharyngeal(NP) swabs in vial transport medium  Result Value Ref Range Status   SARS Coronavirus 2 by RT PCR NEGATIVE NEGATIVE Final    Comment: (NOTE) SARS-CoV-2 target nucleic acids are NOT DETECTED.  The SARS-CoV-2 RNA is generally detectable in upper respiratory specimens during the acute phase of infection. The lowest concentration of SARS-CoV-2 viral copies this assay can detect is 138 copies/mL. A negative result does not preclude SARS-Cov-2 infection and should not be used as the sole basis for treatment or other patient management decisions. A negative result may occur with  improper specimen collection/handling, submission of specimen other than nasopharyngeal swab, presence of viral mutation(s) within the areas targeted by this assay, and inadequate number of viral copies(<138 copies/mL). A negative result must be combined with clinical observations, patient history, and epidemiological information. The  expected result is Negative.  Fact Sheet for Patients:  EntrepreneurPulse.com.au  Fact Sheet for Healthcare Providers:  IncredibleEmployment.be  This test is no t yet approved or cleared by the [place] FDA and  has been authorized for detection and/or diagnosis of SARS-CoV-2 by FDA under an Emergency Use Authorization (EUA). This EUA will remain  in effect (meaning this test can be used) for the duration of the COVID-19 declaration under Section 564(b)(1) of the Act, 21 U.S.C.section 360bbb-3(b)(1), unless the authorization is terminated  or revoked sooner.       Influenza A by PCR NEGATIVE NEGATIVE Final   Influenza B by PCR NEGATIVE NEGATIVE Final    Comment: (NOTE) The Xpert Xpress SARS-CoV-2/FLU/RSV plus assay is intended as an aid in the diagnosis of influenza from Nasopharyngeal swab specimens and should not be used as a sole basis for treatment. Nasal washings and aspirates are unacceptable for Xpert Xpress SARS-CoV-2/FLU/RSV testing.  Fact Sheet for Patients: EntrepreneurPulse.com.au  Fact Sheet for Healthcare Providers: IncredibleEmployment.be  This test is not yet approved or cleared by the [place] FDA and has been authorized for detection and/or diagnosis of SARS-CoV-2 by FDA under an Emergency Use Authorization (EUA). This EUA will remain in effect (meaning this test can be used) for the duration of the COVID-19 declaration under Section 564(b)(1) of the Act, 21 U.S.C. section 360bbb-3(b)(1), unless the authorization is terminated or revoked.  Performed at [facility], [place] [address], [place], [place] [address]           Radiology Studies: VAS [place] ABI WITH/WO TBI  Result Date: 05/08/2021  LOWER EXTREMITY DOPPLER STUDY Patient Name:  Scott [name]  Date of Exam:   05/08/2021 Medical Rec #: [id number]     Accession #:    [id number] Date of Birth: 05-13-1933     Patient  Gender: M Patient Age:   88Y Exam Location:  [facility] Procedure:      VAS [place] ABI WITH/WO TBI Referring Phys: [id number] Karoline Caldwell --------------------------------------------------------------------------------  Indications: Ulceration. High Risk Factors: Hypertension, Diabetes.  Limitations: Today's exam was limited due to patient positioning, involuntary              patient movement and Poor ultrasound/tissue interface. Comparison Study: 09/24/2020 - ABI - R [place] w/ Monophasic flow Performing Technologist: Carlos Levering RVT  Examination Guidelines: A complete evaluation includes at  minimum, Doppler waveform signals and systolic blood pressure reading at the level of bilateral brachial, anterior tibial, and posterior tibial arteries, when vessel segments are accessible. Bilateral testing is considered an integral part of a complete examination. Photoelectric Plethysmograph (PPG) waveforms and toe systolic pressure readings are included as required and additional duplex testing as needed. Limited examinations for reoccurring indications may be performed as noted.  ABI Findings: +---------+------------------+-----+----------+--------------------+ Right    Rt Pressure (mmHg)IndexWaveform  Comment              +---------+------------------+-----+----------+--------------------+ Brachial 118                    biphasic                       +---------+------------------+-----+----------+--------------------+ PTA      71                0.59 monophasic                     +---------+------------------+-----+----------+--------------------+ DP       254               2.12 monophasic                     +---------+------------------+-----+----------+--------------------+ Great Toe                                 Great toe amputation +---------+------------------+-----+----------+--------------------+ +---------+------------------+-----+----------+-------+ Left     Lt Pressure  (mmHg)IndexWaveform  Comment +---------+------------------+-----+----------+-------+ Brachial 120                    biphasic          +---------+------------------+-----+----------+-------+ PTA      74                0.62 monophasic        +---------+------------------+-----+----------+-------+ DP       152               1.27 monophasic        +---------+------------------+-----+----------+-------+ Great Toe0                 0.00                   +---------+------------------+-----+----------+-------+ +-------+-----------+-----------+------------+------------+ ABI/TBIToday's ABIToday's TBIPrevious ABIPrevious TBI +-------+-----------+-----------+------------+------------+ Right  0.59                  [place]                       +-------+-----------+-----------+------------+------------+ Left   1.27                  [place]                       +-------+-----------+-----------+------------+------------+  Summary: Right: Resting right ankle-brachial index indicates moderate right lower extremity arterial disease. 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 Left: Resting left ankle-brachial index is within normal range. No evidence of significant left lower extremity arterial disease. Values are likely falsely elevated due to medial calcification. Unable to obtain TBI due to low amplitude/absent waveforms.  *See table(s) above for measurements and observations.  Electronically signed by Deitra Mayo MD on 05/08/2021 at 5:24:59 PM.    Final    VAS [place] LOWER EXTREMITY ARTERIAL  DUPLEX  Result Date: 05/08/2021 LOWER EXTREMITY ARTERIAL DUPLEX STUDY Patient Name:  Scott [name]  Date of Exam:   05/08/2021 Medical Rec #: [id number]     Accession #:    [id number] Date of Birth: 1932/12/06     Patient Gender: M Patient Age:   88Y Exam Location:  [facility] Procedure:      VAS [place] LOWER EXTREMITY ARTERIAL DUPLEX Referring Phys: [id number] Karoline Caldwell  --------------------------------------------------------------------------------  Indications: Ulceration. High Risk Factors: Hypertension, Diabetes.  Current ABI: R 0.59 L 1.27 Limitations: Poor ultrasound/tissue interface, patient positioning, patient              movement Comparison Study: No prior studies. Performing Technologist: Oliver Hum RVT  Examination Guidelines: A complete evaluation includes B-mode imaging, spectral Doppler, color Doppler, and power Doppler as needed of all accessible portions of each vessel. Bilateral testing is considered an integral part of a complete examination. Limited examinations for reoccurring indications may be performed as noted.   +-----------+--------+-----+--------+----------+--------+ LEFT       PSV cm/sRatioStenosisWaveform  Comments +-----------+--------+-----+--------+----------+--------+ CFA Distal 67                   biphasic           +-----------+--------+-----+--------+----------+--------+ DFA        58                   biphasic           +-----------+--------+-----+--------+----------+--------+ SFA Prox   102                  monophasic         +-----------+--------+-----+--------+----------+--------+ SFA Mid    98                   monophasic         +-----------+--------+-----+--------+----------+--------+ SFA Distal 49                   monophasic         +-----------+--------+-----+--------+----------+--------+ POP Prox   31                   monophasic         +-----------+--------+-----+--------+----------+--------+ POP Distal 76                   monophasic         +-----------+--------+-----+--------+----------+--------+ ATA Prox   91                   monophasic         +-----------+--------+-----+--------+----------+--------+ ATA Mid    56                   monophasic         +-----------+--------+-----+--------+----------+--------+ ATA Distal 57                    monophasic         +-----------+--------+-----+--------+----------+--------+ PTA Prox   44                   monophasic         +-----------+--------+-----+--------+----------+--------+ PTA Mid    62                   monophasic         +-----------+--------+-----+--------+----------+--------+ PTA Distal 19  monophasic         +-----------+--------+-----+--------+----------+--------+ PERO Prox  34                   monophasic         +-----------+--------+-----+--------+----------+--------+ PERO Mid   23                   monophasic         +-----------+--------+-----+--------+----------+--------+ PERO Distal0                    monophasic         +-----------+--------+-----+--------+----------+--------+  Summary: Left: Monophasic waveforms are noted throughout the left arterial system starting in the proximal superficial femoral artery.  See table(s) above for measurements and observations. Electronically signed by Deitra Mayo MD on 05/08/2021 at 5:24:28 PM.    Final         Scheduled Meds:  ALPRAZolam  0.25 mg Oral QHS   aspirin  81 mg Oral QHS   atorvastatin  20 mg Oral Daily   cholecalciferol  2,000 Units Oral Q M,W,F-2000   colchicine  0.3 mg Oral Daily   febuxostat  40 mg Oral Daily   ferrous sulfate  325 mg Oral Q M,W,F   insulin aspart  0-9 Units Subcutaneous TID WC   latanoprost  1 drop Both Eyes QHS   multivitamin with minerals  1 tablet Oral Daily   mupirocin ointment   Nasal BID   sodium chloride flush  3 mL Intravenous Q12H   tamsulosin  0.4 mg Oral QHS   Continuous Infusions:  sodium chloride     furosemide (LASIX) 200 mg in dextrose 5% 100 mL (2mg /mL) infusion       LOS: 5 days    Time spent:35 minutes.     Elmarie Shiley, MD [facility]   If 7PM-7AM, please contact night-coverage [email or web address]  05/10/2021, 10:43 AM

## 2021-05-10 NOTE — Progress Notes (Signed)
VASCULAR SURGERY:  This patient has a new ulceration on the left foot.  He has chronic kidney disease.  He is scheduled for a CO2 angiogram on Monday via a right femoral approach with Dr. Donzetta Matters.  I have written his preop orders.  Gae Gallop, MD 6:50 AM

## 2021-05-10 NOTE — Progress Notes (Signed)
Progress Note  Patient Name: YUVAN MEDINGER Date of Encounter: 05/10/2021  [facility] Cardiologist: Peter Martinique, MD   Subjective   Still feeling short of breath.  No significant progress with diuresis  Inpatient Medications    Scheduled Meds:  ALPRAZolam  0.25 mg Oral QHS   aspirin  81 mg Oral QHS   atorvastatin  20 mg Oral Daily   cholecalciferol  2,000 Units Oral Q M,W,F-2000   colchicine  0.3 mg Oral Daily   febuxostat  40 mg Oral Daily   ferrous sulfate  325 mg Oral Q M,W,F   furosemide  80 mg Intravenous Once   insulin aspart  0-9 Units Subcutaneous TID WC   latanoprost  1 drop Both Eyes QHS   multivitamin with minerals  1 tablet Oral Daily   mupirocin ointment   Nasal BID   sodium chloride flush  3 mL Intravenous Q12H   tamsulosin  0.4 mg Oral QHS   Continuous Infusions:  sodium chloride     furosemide (LASIX) 200 mg in dextrose 5% 100 mL (2mg /mL) infusion     PRN Meds: sodium chloride, acetaminophen **OR** acetaminophen, docusate sodium, fluticasone, nitroGLYCERIN, oxyCODONE, sodium chloride flush   Vital Signs    Vitals:   05/09/21 2010 05/09/21 2322 05/10/21 0436 05/10/21 0905  BP: 134/75  117/72 119/79  Pulse: 73 81 82 79  Resp: 17 18 20 20   Temp: 97.6 F (36.4 C)  98.2 F (36.8 C)   TempSrc: Oral  Oral   SpO2: 99% 98% 98% 100%  Weight:   100.1 kg   Height:        Intake/Output Summary (Last 24 hours) at 05/10/2021 0906 Last data filed at 05/10/2021 4709 Gross per 24 hour  Intake 720 ml  Output 750 ml  Net -30 ml   Last 3 Weights 05/10/2021 05/09/2021 05/08/2021  Weight (lbs) 220 lb 11.2 oz 222 lb 11.2 oz 221 lb 5.5 oz  Weight (kg) 100.109 kg 101.016 kg 100.4 kg      Telemetry    Sinus rhythm.  First-degree AV block.  5 beats NSVT.  PVCs.- Personally Reviewed  ECG    N/a - Personally Reviewed  Physical Exam   VS:  BP 119/79 (BP Location: Left Arm)   Pulse 79   Temp 98.2 F (36.8 C) (Oral)   Resp 20   Ht 6' (1.829 m)   Wt 100.1  kg   SpO2 100%   BMI 29.93 kg/m  , BMI Body mass index is 29.93 kg/m. GENERAL: Chronically ill-appearing HEENT: Pupils equal round and reactive, fundi not visualized, oral mucosa unremarkable NECK: + jugular venous distention.  JVP 12 mmHg waveform within normal limits, carotid upstroke brisk and symmetric, no bruits LUNGS:  Clear to auscultation bilaterally HEART:  mostly regular with occasional ectopy.  PMI not displaced or sustained,S1 and S2 within normal limits, no S3, no S4, no clicks, no rubs, no murmurs ABD:  Flat, positive bowel sounds normal in frequency in pitch, no bruits, no rebound, no guarding, no midline pulsatile mass, no hepatomegaly, no splenomegaly EXT: Legs wrapped.  2+ lower extremity edema SKIN:  No rashes no nodules NEURO:  Cranial nerves II through XII grossly intact, motor grossly intact throughout PSYCH:  Cognitively intact, oriented to person place and time   Labs    High Sensitivity Troponin:   No results for input(s): TROPONINIHS in the last 720 hours.     Chemistry Recent Labs  Lab 05/04/21 1931 05/05/21 1328  05/08/21 0923 05/09/21 0729 05/10/21 0300  NA 134*   < > 135 137 136  K 4.4   < > 4.2 4.1 4.3  CL 96*   < > 99 99 96*  CO2 27   < > 26 29 29   GLUCOSE 169*   < > 155* 97 126*  BUN 71*   < > 69* 69* 72*  CREATININE 2.42*   < > 2.25* 2.29* 2.24*  CALCIUM 8.6*   < > 8.4* 8.8* 8.9  PROT 7.2  --  6.2*  --   --   ALBUMIN 2.8*  --  2.4*  --   --   AST 26  --  24  --   --   ALT 11  --  11  --   --   ALKPHOS 106  --  85  --   --   BILITOT 1.0  --  0.8  --   --   GFRNONAA 25*   < > 28* 27* 28*  ANIONGAP 11   < > 10 9 11    < > = values in this interval not displayed.     Hematology Recent Labs  Lab 05/08/21 0923 05/09/21 0729 05/10/21 0300  WBC 5.0 5.2 6.1  RBC 3.15* 3.39* 3.48*  HGB 9.1* 9.7* 9.9*  HCT 30.6* 32.9* 33.4*  MCV 97.1 97.1 96.0  MCH 28.9 28.6 28.4  MCHC 29.7* 29.5* 29.6*  RDW 17.8* 17.5* 17.5*  PLT 120* 130* 140*     BNP Recent Labs  Lab 05/04/21 1931  BNP 2,626.7*     DDimer No results for input(s): DDIMER in the last 168 hours.   Radiology    VAS [place] ABI WITH/WO TBI  Result Date: 05/08/2021  LOWER EXTREMITY DOPPLER STUDY Patient Name:  EITAN DOUBLEDAY  Date of Exam:   05/08/2021 Medical Rec #: [id number]     Accession #:    [id number] Date of Birth: 06/05/1933     Patient Gender: M Patient Age:   87Y Exam Location:  [facility] Procedure:      VAS [place] ABI WITH/WO TBI Referring Phys: [id number] Karoline Caldwell --------------------------------------------------------------------------------  Indications: Ulceration. High Risk Factors: Hypertension, Diabetes.  Limitations: Today's exam was limited due to patient positioning, involuntary              patient movement and Poor ultrasound/tissue interface. Comparison Study: 09/24/2020 - ABI - R [place] L [place] w/ Monophasic flow Performing Technologist: Carlos Levering RVT  Examination Guidelines: A complete evaluation includes at minimum, Doppler waveform signals and systolic blood pressure reading at the level of bilateral brachial, anterior tibial, and posterior tibial arteries, when vessel segments are accessible. Bilateral testing is considered an integral part of a complete examination. Photoelectric Plethysmograph (PPG) waveforms and toe systolic pressure readings are included as required and additional duplex testing as needed. Limited examinations for reoccurring indications may be performed as noted.  ABI Findings: +---------+------------------+-----+----------+--------------------+ Right    Rt Pressure (mmHg)IndexWaveform  Comment              +---------+------------------+-----+----------+--------------------+ Brachial 118                    biphasic                       +---------+------------------+-----+----------+--------------------+ PTA      71                0.59 monophasic                      +---------+------------------+-----+----------+--------------------+  DP       254               2.12 monophasic                     +---------+------------------+-----+----------+--------------------+ Great Toe                                 Great toe amputation +---------+------------------+-----+----------+--------------------+ +---------+------------------+-----+----------+-------+ Left     Lt Pressure (mmHg)IndexWaveform  Comment +---------+------------------+-----+----------+-------+ Brachial 120                    biphasic          +---------+------------------+-----+----------+-------+ PTA      74                0.62 monophasic        +---------+------------------+-----+----------+-------+ DP       152               1.27 monophasic        +---------+------------------+-----+----------+-------+ Great Toe0                 0.00                   +---------+------------------+-----+----------+-------+ +-------+-----------+-----------+------------+------------+ ABI/TBIToday's ABIToday's TBIPrevious ABIPrevious TBI +-------+-----------+-----------+------------+------------+ Right  0.59                  [place]                       +-------+-----------+-----------+------------+------------+ Left   1.27                  [place]                       +-------+-----------+-----------+------------+------------+  Summary: Right: Resting right ankle-brachial index indicates moderate right lower extremity arterial disease. 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 Left: Resting left ankle-brachial index is within normal range. No evidence of significant left lower extremity arterial disease. Values are likely falsely elevated due to medial calcification. Unable to obtain TBI due to low amplitude/absent waveforms.  *See table(s) above for measurements and observations.  Electronically signed by Deitra Mayo MD on 05/08/2021 at 5:24:59 PM.    Final    VAS [place]  LOWER EXTREMITY ARTERIAL DUPLEX  Result Date: 05/08/2021 LOWER EXTREMITY ARTERIAL DUPLEX STUDY Patient Name:  TRESON LAURA Kearse  Date of Exam:   05/08/2021 Medical Rec #: [id number]     Accession #:    [id number] Date of Birth: December 15, 1932     Patient Gender: M Patient Age:   87Y Exam Location:  [facility] Procedure:      VAS [place] LOWER EXTREMITY ARTERIAL DUPLEX Referring Phys: [id number] Karoline Caldwell --------------------------------------------------------------------------------  Indications: Ulceration. High Risk Factors: Hypertension, Diabetes.  Current ABI: R 0.59 L 1.27 Limitations: Poor ultrasound/tissue interface, patient positioning, patient              movement Comparison Study: No prior studies. Performing Technologist: Oliver Hum RVT  Examination Guidelines: A complete evaluation includes B-mode imaging, spectral Doppler, color Doppler, and power Doppler as needed of all accessible portions of each vessel. Bilateral testing is considered an integral part of a complete examination. Limited examinations for reoccurring indications may be performed as noted.   +-----------+--------+-----+--------+----------+--------+ LEFT       PSV cm/sRatioStenosisWaveform  Comments +-----------+--------+-----+--------+----------+--------+  CFA Distal 67                   biphasic           +-----------+--------+-----+--------+----------+--------+ DFA        58                   biphasic           +-----------+--------+-----+--------+----------+--------+ SFA Prox   102                  monophasic         +-----------+--------+-----+--------+----------+--------+ SFA Mid    98                   monophasic         +-----------+--------+-----+--------+----------+--------+ SFA Distal 49                   monophasic         +-----------+--------+-----+--------+----------+--------+ POP Prox   31                   monophasic          +-----------+--------+-----+--------+----------+--------+ POP Distal 76                   monophasic         +-----------+--------+-----+--------+----------+--------+ ATA Prox   91                   monophasic         +-----------+--------+-----+--------+----------+--------+ ATA Mid    56                   monophasic         +-----------+--------+-----+--------+----------+--------+ ATA Distal 57                   monophasic         +-----------+--------+-----+--------+----------+--------+ PTA Prox   44                   monophasic         +-----------+--------+-----+--------+----------+--------+ PTA Mid    62                   monophasic         +-----------+--------+-----+--------+----------+--------+ PTA Distal 19                   monophasic         +-----------+--------+-----+--------+----------+--------+ PERO Prox  34                   monophasic         +-----------+--------+-----+--------+----------+--------+ PERO Mid   23                   monophasic         +-----------+--------+-----+--------+----------+--------+ PERO Distal0                    monophasic         +-----------+--------+-----+--------+----------+--------+  Summary: Left: Monophasic waveforms are noted throughout the left arterial system starting in the proximal superficial femoral artery.  See table(s) above for measurements and observations. Electronically signed by Deitra Mayo MD on 05/08/2021 at 5:24:28 PM.    Final     Cardiac Studies   Echo 04/10/21: 1. Left ventricular ejection fraction, by estimation, is 25 to 30%. The  left ventricle has severely decreased function. The left ventricle  demonstrates global hypokinesis. The left ventricular internal cavity size  was moderately dilated. Left  ventricular diastolic parameters are consistent with Grade II diastolic  dysfunction (pseudonormalization). Elevated left ventricular end-diastolic  pressure.    2. Right ventricular systolic function is severely reduced. The right  ventricular size is severely enlarged. There is severely elevated  pulmonary artery systolic pressure.   3. Left atrial size was severely dilated.   4. Right atrial size was moderately dilated.   5. The mitral valve is degenerative. Mild mitral valve regurgitation.   6. Tricuspid valve regurgitation is mild to moderate.   7. The aortic valve is tricuspid. There is mild calcification of the  aortic valve. There is mild thickening of the aortic valve. Aortic valve  regurgitation is mild.   Patient Profile     87 y.o. male with CAD, chronic systolic and diastolic heart failure, pulmonary hypertension, hypertension, hyperlipidemia, diabetes, CKD 4, OSA on CPAP, and PAD admitted with acute on chronic systolic diastolic heart failure.  Assessment & Plan    # Acute on chronic systolic ingestive heart failure: # CKD IV: Biventricular failure.  LV EF 25 to 30%.  His home torsemide was increased and then he was converted to IV Lasix with increasing doses and still is net even with diuresis.  He had about 1 L of urine output yesterday.  No improvement in his weight or his breathing.  He was given a dose of metolazone an hour prior to his Lasix and still did not have significant diuresis.  Renal function is stable.  He remains volume overloaded.  We will give him a bolus of IV Lasix 80 mg followed by an infusion at 10 mg/h.  At this point, recommend that nephrology weigh in as he has not diuresed with aggressive diuretic dosing.  He is not a candidate for inotropes or advanced heart failure therapies due to age.  Baseline creatinine is 2-2.5.  #CAD status post CABG: # Hyperlipidemia: LIMA to LAD, SVG to PLA, and SVG to PDA.  He currently has no anginal symptoms.  Continue aspirin and atorvastatin.  #Essential hypertension: Losartan has been held due to worsening renal dysfunction.  Blood pressure is stable.  #PAD: He has a left  foot ulcer and is going for CO2 angiography on Monday with Dr. Donzetta Matters.  For questions or updates, please contact [place] Please consult [email or web address] for contact info under        Signed, Skeet Latch, MD  05/10/2021, 9:06 AM

## 2021-05-11 ENCOUNTER — Encounter (HOSPITAL_COMMUNITY): Payer: Self-pay | Admitting: Vascular Surgery

## 2021-05-11 ENCOUNTER — Encounter (HOSPITAL_COMMUNITY)
Admission: EM | Disposition: A | Discharge status: Home Health | Payer: Self-pay | Source: Home / Self Care | Attending: Internal Medicine

## 2021-05-11 DIAGNOSIS — I10 Essential (primary) hypertension: Secondary | ICD-10-CM

## 2021-05-11 DIAGNOSIS — E785 Hyperlipidemia, unspecified: Secondary | ICD-10-CM | POA: Diagnosis not present

## 2021-05-11 DIAGNOSIS — I5023 Acute on chronic systolic (congestive) heart failure: Secondary | ICD-10-CM | POA: Diagnosis not present

## 2021-05-11 DIAGNOSIS — I70244 Atherosclerosis of native arteries of left leg with ulceration of heel and midfoot: Secondary | ICD-10-CM

## 2021-05-11 DIAGNOSIS — N184 Chronic kidney disease, stage 4 (severe): Secondary | ICD-10-CM | POA: Diagnosis not present

## 2021-05-11 HISTORY — PX: ABDOMINAL AORTOGRAM W/LOWER EXTREMITY: CATH118223

## 2021-05-11 LAB — BASIC METABOLIC PANEL
Anion gap: 10 (ref 5–15)
BUN: 75 mg/dL — ABNORMAL HIGH (ref 8–23)
CO2: 27 mmol/L (ref 22–32)
Calcium: 8.8 mg/dL — ABNORMAL LOW (ref 8.9–10.3)
Chloride: 98 mmol/L (ref 98–111)
Creatinine, Ser: 2.29 mg/dL — ABNORMAL HIGH (ref 0.61–1.24)
GFR, Estimated: 27 mL/min — ABNORMAL LOW (ref >60)
Glucose, Bld: 132 mg/dL — ABNORMAL HIGH (ref 70–99)
Potassium: 4.2 mmol/L (ref 3.5–5.1)
Sodium: 135 mmol/L (ref 135–145)

## 2021-05-11 LAB — GLUCOSE, CAPILLARY
Glucose-Capillary: 121 mg/dL — ABNORMAL HIGH (ref 70–99)
Glucose-Capillary: 102 mg/dL — ABNORMAL HIGH (ref 70–99)
Glucose-Capillary: 151 mg/dL — ABNORMAL HIGH (ref 70–99)
Glucose-Capillary: 168 mg/dL — ABNORMAL HIGH (ref 70–99)

## 2021-05-11 SURGERY — ABDOMINAL AORTOGRAM W/LOWER EXTREMITY
Anesthesia: LOCAL

## 2021-05-11 MED ORDER — IODIXANOL 320 MG/ML IV SOLN
INTRAVENOUS | Status: DC | PRN
  Administered 2021-05-11: 10 mL via INTRA_ARTERIAL

## 2021-05-11 MED ORDER — SODIUM CHLORIDE 0.9 % IV SOLN: INTRAVENOUS | Status: DC

## 2021-05-11 MED ORDER — LIDOCAINE HCL (PF) 1 % IJ SOLN
INTRAMUSCULAR | Status: DC | PRN
  Administered 2021-05-11: 15 mL via INTRADERMAL

## 2021-05-11 MED ORDER — HEPARIN (PORCINE) IN NACL 1000-0.9 UT/500ML-% IV SOLN
INTRAVENOUS | Status: DC | PRN
  Administered 2021-05-11: 500 mL

## 2021-05-11 SURGICAL SUPPLY — 13 items
CATH OMNI FLUSH 5F 65CM (CATHETERS) ×1 IMPLANT
CATH TEMPO AQUA 5F 100CM (CATHETERS) ×1 IMPLANT
CLOSURE MYNX CONTROL 5F (Vascular Products) ×1 IMPLANT
GUIDEWIRE ANGLED .035X150CM (WIRE) ×1 IMPLANT
KIT ANGIASSIST CO2 SYSTEM (KITS) ×1 IMPLANT
KIT MICROPUNCTURE NIT STIFF (SHEATH) ×1 IMPLANT
KIT PV (KITS) ×2 IMPLANT
SHEATH PINNACLE 5F 10CM (SHEATH) ×1 IMPLANT
SHEATH PROBE COVER 6X72 (BAG) ×1 IMPLANT
SYR MEDRAD MARK V 150ML (SYRINGE) ×1 IMPLANT
TRANSDUCER W/STOPCOCK (MISCELLANEOUS) ×2 IMPLANT
TRAY PV CATH (CUSTOM PROCEDURE TRAY) ×2 IMPLANT
WIRE BENTSON .035X145CM (WIRE) ×1 IMPLANT

## 2021-05-11 NOTE — Op Note (Signed)
    Patient name: Scott [name] MRN: [id number] DOB: June 06, 1933 Sex: male  05/11/2021 Pre-operative Diagnosis: Peripheral arterial disease with left foot ulceration Post-operative diagnosis:  Same Surgeon:  Eda Paschal. Donzetta Matters, MD Procedure Performed: 1.  Ultrasound-guided cannulation right common femoral artery 2.  CO2 aortogram and left lower extremity angiography 3.  Selection of left popliteal artery and below the knee contrasted angiogram 4.  Mynx device closure right common femoral artery   Indications: 87 year old male with new ulceration of his left foot.  He does have venous stasis ulcer of the right lower extremity he has undergone angiography there with no intervention.  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  He is indicated for angiography with possible intervention and given chronic kidney disease we will plan to use CO2.  Findings: The aorta and iliac segments appear patent.  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  Bilateral renal arteries are patent.  Left common femoral artery does have some calcific disease as well as the SFA but there is no flow-limiting stenosis.  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  Below the knee we did evaluate with contrast there is a 50% stenosis of the anterior tibial artery at the takeoff but there is brisk flow all the way to the foot filling the arch only via the anterior tibial artery.  The peroneal and posterior tibial artery are not visualized.  No intervention was undertaken.   Procedure:  The patient was identified in the holding area and taken to room 8.  The patient was then placed supine on the table and prepped and draped in the usual sterile fashion.  A time out was called.  Ultrasound was used to evaluate the right common femoral artery.  This was noted to be somewhat ectatic.  The areas anesthetized 1% lidocaine cannulated micropuncture needle followed by wire and a  sheath.  Images saved the permanent record.  Bentson wires placed followed by 5 [place] sheath.  Omni catheter was placed to the level of L1 aortogram performed with CO2.  We then crossed bifurcation with Omni catheter Bentson wire placed the Omni catheter in the left common femoral artery and left lower extremity angiography was performed with CO2.  We then used a Glidewire straight catheter to the level of the below-knee popliteal artery and used contrast with 2 injections below the knee with the above findings.  We elected for no intervention.  The catheter was removed.  Main device was deployed.  He tolerated procedure without any complication.   Contrast: 10 cc  Jordyan Hardiman C. Donzetta Matters, MD [facility] Office: [phone] Pager: [phone]

## 2021-05-11 NOTE — Progress Notes (Signed)
PT Cancellation Note  Patient Details Name: Scott [name] MRN: [id number] DOB: 23-Jun-1933   Cancelled Treatment:    Reason Eval/Treat Not Completed: Patient at procedure or test/unavailable. Pt in cath lab at this time. Will follow up as time allows vs another date.   Willow Ora, PTA, CLT Acute Rehab Services [phone] 05/11/21, 10:26 AM    Willow Ora 05/11/2021, 10:26 AM

## 2021-05-11 NOTE — Consult Note (Signed)
Nephrology Consult   Requesting provider: Niel Hummer Service requesting consult: [facility] Reason for consult: Volume overload   Assessment/Recommendations: [name] is a/an 87 y.o. male with a past medical history HFrEF, CAD s/p CABG, HTN, CKD, HLD, anemia, gout, PAD who present w/ CHF exacebration   CHF exacerbation: Volume overload.  Initially poor response to diuretics now improving over the past 24 hours.  Cardiology following.  No AKI at this point.  We can continue to push his diuretics for response if his urine output does not improve tomorrow -Continue Lasix infusion as prescribed -Increase infusion tomorrow and add metolazone if he is not net -1 to 2 L  CAD status post CABG: Continue aspirin and statin  PAD: Vascular surgery following.  No intervention needed for left leg.  Continue aspirin and statin  CKD 4: Urinalysis historically negative for protein.  Likely significant arterionephrosclerosis and cardiorenal syndrome.  No AKI at this time.  Baseline appears to be 2.2-3.  Given he is on the low end of his baseline it would indicate that he needs volume removal  Hypertension: On losartan at home.  Holding for now   Recommendations conveyed to primary service.    [facility] 05/11/2021 11:11 AM   _____________________________________________________________________________________ CC: AKI on CKD  History of Present Illness: Scott [name] is a/an 87 y.o. male with a past medical history of HFrEF, CAD s/p CABG, HTN, CKD, HLD, anemia, gout, PAD who presents with volume overload   Patient presented on 7/26 with worsening weight gain, edema, shortness of breath.  He had noticed this worsening weight gain for several weeks.  He was taking his diuretics with poor response despite increasing the dose outpatient.  Labs and imaging on admission are consistent with volume overload.  Cardiology was consulted and has been helping with  diuresis.  Patient was also noted to have lower extremity edema with a chronic wound and left foot ulceration.  Vascular was consulted who performed aortogram of the left lower extremity today which demonstrates some chronic findings but nothing acute requiring intervention.  Some contrast was used.  Patient's creatinine fluctuates outpatient between 2.2 and 3.  In the hospital more recently his creatinine has been around 2.2-2.3.  We were consulted for rising BUN with poor response to diuretics. He is currently on a lasix infusion. The patient states that over the past 1-2 days he has finally seen improvement in his edema, shortness of breath, volume overload.   Medications:  Current Facility-Administered Medications  Medication Dose Route Frequency Provider Last Rate Last Admin   0.9 %  sodium chloride infusion  250 mL Intravenous PRN Waynetta Sandy, MD       0.9 %  sodium chloride infusion   Intravenous Continuous Waynetta Sandy, MD 100 mL/hr at 05/11/21 0919 New Bag at 05/11/21 0919   acetaminophen (TYLENOL) tablet 650 mg  650 mg Oral Q6H PRN Waynetta Sandy, MD   650 mg at 05/07/21 2147   Or   acetaminophen (TYLENOL) suppository 650 mg  650 mg Rectal Q6H PRN Waynetta Sandy, MD       ALPRAZolam Duanne Moron) tablet 0.25 mg  0.25 mg Oral QHS Waynetta Sandy, MD   0.25 mg at 05/10/21 2233   aspirin chewable tablet 81 mg  81 mg Oral QHS Waynetta Sandy, MD   81 mg at 05/10/21 2233   atorvastatin (LIPITOR) tablet 20 mg  20 mg Oral Daily Waynetta Sandy, MD  20 mg at 05/11/21 1108   cholecalciferol (VITAMIN D) tablet 2,000 Units  2,000 Units Oral Q M,W,F-2000 Waynetta Sandy, MD   2,000 Units at 05/08/21 2120   colchicine tablet 0.3 mg  0.3 mg Oral Daily Waynetta Sandy, MD   0.3 mg at 05/11/21 1108   docusate sodium (COLACE) capsule 100 mg  100 mg Oral Daily PRN Waynetta Sandy, MD       febuxostat  Melburn Popper) tablet 40 mg  40 mg Oral Daily Waynetta Sandy, MD   40 mg at 05/11/21 1107   ferrous sulfate tablet 325 mg  325 mg Oral Q M,W,F Waynetta Sandy, MD   325 mg at 05/08/21 0953   fluticasone (FLONASE) 50 MCG/ACT nasal spray 1 spray  1 spray Each Nare Daily PRN Waynetta Sandy, MD       furosemide (LASIX) 200 mg in dextrose 5 % 100 mL (2 mg/mL) infusion  10 mg/hr Intravenous Continuous Waynetta Sandy, MD   Stopped at 05/11/21 0914   insulin aspart (novoLOG) injection 0-9 Units  0-9 Units Subcutaneous TID WC Waynetta Sandy, MD   1 Units at 05/11/21 0634   latanoprost (XALATAN) 0.005 % ophthalmic solution 1 drop  1 drop Both Eyes QHS Waynetta Sandy, MD   1 drop at 05/10/21 2234   multivitamin with minerals tablet 1 tablet  1 tablet Oral Daily Waynetta Sandy, MD   1 tablet at 05/11/21 1108   mupirocin ointment (BACTROBAN) 2 %   Nasal BID Waynetta Sandy, MD   Given at 05/11/21 [phone]   nitroGLYCERIN (NITROSTAT) SL tablet 0.4 mg  0.4 mg Sublingual Q5 min PRN Waynetta Sandy, MD       oxyCODONE (Oxy IR/ROXICODONE) immediate release tablet 5 mg  5 mg Oral Q6H PRN Waynetta Sandy, MD   5 mg at 05/11/21 1108   sodium chloride flush (NS) 0.9 % injection 3 mL  3 mL Intravenous Q12H Waynetta Sandy, MD   3 mL at 05/11/21 0919   sodium chloride flush (NS) 0.9 % injection 3 mL  3 mL Intravenous PRN Waynetta Sandy, MD   3 mL at 05/06/21 0856   tamsulosin (FLOMAX) capsule 0.4 mg  0.4 mg Oral QHS Waynetta Sandy, MD   0.4 mg at 05/10/21 2233     ALLERGIES Patient has no known allergies.  MEDICAL HISTORY Past Medical History:  Diagnosis Date   Acute biliary pancreatitis 10/2017   with choledocholithiasis.  ERCP, stone extraction performed   Arthritis    CAD (coronary artery disease)    a. 1997 CABGx3 (VG->RPDA, VG->PLV, LIMA->LAD);  b. 07/2004 PCI VG->PLV (3.5x16 Taxus  DES, 3.5x12 Taxus DES); c. 11/2009 PCI: VG->PLV 3.5x12 Promus DES), VG->RPDA (3.5x15 Promus DES); d. 08/2012 Cath: patent grafts; e. 05/2013 Lexiscan MV: no ischemia/infarction, EF 44%.   Cataract    Chronic combined systolic and diastolic CHF (congestive heart failure) ([place]) 08/2012   a. EF 50-55% 11/2011; b. 08/2012 - EF 25-30%,  with mild RM, mod TR, mod RAE & LAE, moderate reduced RV systolic function and PASP of 10mmHg. - > cath showed patent grafts & Mod Puml HTN wth elevated PCWP;  c. 01/2014 Echo: EF 35-40%, mod LVH, mod HK, mildly dil LA, mod dil RA, PASP 54mmHg.   CKD (chronic kidney disease), stage III (HCC)    Dyslipidemia    GERD (gastroesophageal reflux disease)    Gout    history colon cancer 2005  colon surgery done   History of home oxygen therapy    uses oxygen nightly with cpap    Hyperkalemia    Hypertension    Insulin Dependent Diabetes mellitus    type 2   Myocardial infarction ([place]) 1997   PAD (peripheral artery disease) (HCC)    Sleep apnea    Thrombocytopenia ([place]) 2011   dates back to 2011   UTI (lower urinary tract infection) 08/2012   E.coli     SOCIAL HISTORY Social History   Socioeconomic History   Marital status: Single    Spouse name: Not on file   Number of children: 0   Years of education: Not on file   Highest education level: Not on file  Occupational History   Occupation: retired   ran a golf course  Tobacco Use   Smoking status: Former    Packs/day: 1.50    Years: 35.00    Pack years: 52.50    Types: Cigarettes    Quit date: 10/12/1983    Years since quitting: 37.6   Smokeless tobacco: Never  Vaping Use   Vaping Use: Never used  Substance and Sexual Activity   Alcohol use: Yes    Alcohol/week: 3.0 - 4.0 standard drinks    Types: 3 - 4 Standard drinks or equivalent per week   Drug use: No   Sexual activity: Not Currently    Birth control/protection: None  Other Topics Concern   Not on file  Social History Narrative   Lives in  [place] by himself.  Active but does not routinely exercise.   Social Determinants of Health   Financial Resource Strain: Not on file  Food Insecurity: Not on file  Transportation Needs: No Transportation Needs   Lack of Transportation (Medical): No   Lack of Transportation (Non-Medical): No  Physical Activity: Not on file  Stress: Not on file  Social Connections: Not on file  Intimate Partner Violence: Not on file     FAMILY HISTORY Family History  Problem Relation Age of Onset   Coronary artery disease Father    Heart attack Father    Coronary artery disease Brother        1/2 brother    Kidney disease Sister    Cardiomyopathy Mother    Asthma Brother       Review of Systems: 12 systems reviewed Otherwise as per HPI, all other systems reviewed and negative  Physical Exam: Vitals:   05/11/21 1039 05/11/21 1105  BP: 120/68 109/60  Pulse: 71 77  Resp: 12 17  Temp:  98.4 F (36.9 C)  SpO2: 100% 97%   Total I/O In: 0  Out: 400 [Urine:400]  Intake/Output Summary (Last 24 hours) at 05/11/2021 1111 Last data filed at 05/11/2021 8527 Gross per 24 hour  Intake 617.88 ml  Output 1983 ml  Net -1365.12 ml   General: -appearing, no acute distress HEENT: anicteric sclera, oropharynx clear without lesions CV: Normal rate, no obvious murmur, 1+ pitting edema in bilateral lower extremities Lungs: clear to auscultation bilaterally anteriorly, normal work of breathing Abd: soft, non-tender, non-distended Skin: no visible lesions or rashes Psych: alert, engaged, appropriate mood and affect Musculoskeletal: no obvious deformities Neuro: normal speech, no gross focal deficits   Test Results Reviewed Lab Results  Component Value Date   NA 135 05/11/2021   K 4.2 05/11/2021   CL 98 05/11/2021   CO2 27 05/11/2021   BUN 75 (H) 05/11/2021   CREATININE 2.29 (H) 05/11/2021  GFR 48.04 (L) 08/06/2013   GLU 128 03/31/2021   CALCIUM 8.8 (L) 05/11/2021   ALBUMIN 2.4 (L) 05/08/2021    PHOS 3.9 10/18/2017     I have reviewed all relevant outside healthcare records related to the patient's current hospitalization

## 2021-05-11 NOTE — Progress Notes (Addendum)
PROGRESS NOTE    Scott [name]  WLN:[id number] DOB: November 13, 1932 DOA: 05/04/2021 PCP: Reubin Milan, MD   Brief Narrative: 87 year old with past medical history significant for chronic systolic heart failure, CAD status post CABG stent, hypertension, CKD stage IV, HLD, GI bleed, chronic iron deficiency anemia, gout, thrombocytopenia, PAD who presents to the ER complaining of weight gain, edema worsening shortness of breath.  He was admitted to chronic few weeks ago and left with a weight of 210 pounds.  He has gained 15 pounds.  He has been taking his torsemide 20 mg twice daily.  He increase this to 40 mg twice daily a week ago.  On 7/24 EF is torsemide was increased to 80 mg in the morning and 40 in the afternoon per cardiology note.  Patient admitted with acute on chronic heart failure exacerbation.   Assessment & Plan:   Principal Problem:   Acute exacerbation of CHF (congestive heart failure) (HCC) Active Problems:   Hypertension   Dyslipidemia   Arteriosclerotic coronary artery disease   Diabetes mellitus, type II, insulin dependent (HCC)   OSA (obstructive sleep apnea)   PAD (peripheral artery disease) (HCC)   CKD (chronic kidney disease) stage 4, GFR 15-29 ml/min (HCC)   Iron deficiency anemia   Thrombocytopenia (HCC)   Prolonged QT interval   1-Acute on Chronic Systolic and Diastolic Heart Failure Exacerbation: -Patient presents with creatinine of 2.2, BNP 2626, chest x-ray with pulmonary congestion. -Recent echo 04/2021 ejection fraction 25 to 30% with global hypokinesis, elevated pulmonary systolic pressure. -Dry weight 208--210.  Presented with a weight of 225 -on IV Lasix 80 mg IV  TID -Cardiology  has been consulted. -Weight : 221---222--221--222 -Received metolazone 7/30. No significant urine out put.  Nephrologist consulted. Dr Mauri Brooklyn will see patient in consultation.  -Will need to reassess home diuretics dose and options at discharge.   2-Hypertension: On  Lasix.   CAD: Continue with aspirin and statins Beta-blocker discontinued last hospitalization due to AV block and bradycardia  4-Diabetes type 2 insulin-dependent He is 70/30 as a sliding scale at home SSI  Gout; On lower dose colchicine due to Renal function.   LE edema, chronic wound. Left ulceration left foot. PAD Appreciate wound care evaluation recommendation.  Continue with oxycodone PRN>  Vascular consulted. Plan for CO2 angiogram  on Monday.  ABI: Right: Resting right ankle-brachial index indicates moderate right lower  extremity arterial disease. Left: Resting left ankle-brachial index is within normal range. No  evidence of significant left lower extremity arterial disease.  Underwent arteriogram; aorta and iliac segment appear patent.  Ectatic aorta and bilateral common iliac arteries.  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  50% stenosis of the anterior tibial artery.  Peroneal and posterior tibial artery are not visualized.  No intervention was undertaken.  Dyslipidemia: Continue with a statin.  CKD Stage IV: Monitor renal function on IV diuresis.  Iron deficiency anemia: Stable.  Thrombocytopenia chronic, stable.   PAD: Follow-up with Dr. Oneida Alar.  GERD: Continue with PPI.  Abdominal Distension: possible Cirrhosis by [place].  [place] ordered. Small to moderate ascites  lower quadrant. Plan to continue with IV lasix.   Obesity;  Needs life style modifications.   Estimated body mass index is 29.42 kg/m as calculated from the following:   Height as of this encounter: 6' (1.829 m).   Weight as of this encounter: 98.4 kg.   DVT prophylaxis: SCDs Code Status: DNR Family Communication: Care discussed with patient and brather who was at  bedside 8/01. Disposition Plan:  Status is: Inpatient  Remains inpatient appropriate because:IV treatments appropriate due to intensity of illness or inability to take PO  Dispo: The patient is from: Home              Anticipated d/c  is to: Home              Patient currently is not medically stable to d/c.   Difficult to place patient No        Consultants:  Cardiology   Procedures:  None  Antimicrobials:  None  Subjective: Feeling a little better. Still with significant abdominal distension and LE edema Underwent CO2 arteriogram, no intervention required. .   Objective: Vitals:   05/10/21 1225 05/10/21 2109 05/10/21 2210 05/11/21 0500  BP: 122/74 121/77    Pulse: 76 80    Resp: 20 20    Temp: (!) 97.4 F (36.3 C) 97.6 F (36.4 C)    TempSrc: Oral Oral    SpO2: 100% 95% 95%   Weight:    98.4 kg  Height:        Intake/Output Summary (Last 24 hours) at 05/11/2021 0810 Last data filed at 05/11/2021 0744 Gross per 24 hour  Intake 1094.88 ml  Output 1883 ml  Net -788.12 ml    Filed Weights   05/09/21 0414 05/10/21 0436 05/11/21 0500  Weight: 101 kg 100.1 kg 98.4 kg    Examination:  General exam: NAD Respiratory system: BL crackles.  Cardiovascular system: S 1, S 2 RRR Gastrointestinal system: BS present, Soft, ,nt Central nervous system: Alert Extremities: BL LE with edema, dressing, left foot open wound  Data Reviewed: I have personally reviewed following labs and imaging studies  CBC: Recent Labs  Lab 05/04/21 1931 05/06/21 0215 05/07/21 0909 05/08/21 0923 05/09/21 0729 05/10/21 0300  WBC 7.3 5.5 5.2 5.0 5.2 6.1  NEUTROABS 4.8  --   --   --   --   --   HGB 10.0* 9.4* 9.4* 9.1* 9.7* 9.9*  HCT 35.2* 31.4* 31.2* 30.6* 32.9* 33.4*  MCV 98.9 96.3 96.3 97.1 97.1 96.0  PLT 122* 125* 104* 120* 130* 140*    Basic Metabolic Panel: Recent Labs  Lab 05/05/21 1328 05/06/21 0215 05/07/21 0909 05/08/21 0923 05/09/21 0729 05/10/21 0300 05/11/21 0239  NA 137   < > 138 135 137 136 135  K 4.0   < > 4.1 4.2 4.1 4.3 4.2  CL 100   < > 100 99 99 96* 98  CO2 27   < > 31 26 29 29 27   GLUCOSE 128*   < > 153* 155* 97 126* 132*  BUN 69*   < > 67* 69* 69* 72* 75*  CREATININE 2.13*   < >  2.23* 2.25* 2.29* 2.24* 2.29*  CALCIUM 8.5*   < > 8.6* 8.4* 8.8* 8.9 8.8*  MG 2.0  --   --   --   --   --   --    < > = values in this interval not displayed.    GFR: Estimated Creatinine Clearance: 28.1 mL/min (A) (by C-G formula based on SCr of 2.29 mg/dL (H)). Liver Function Tests: Recent Labs  Lab 05/04/21 1931 05/08/21 0923  AST 26 24  ALT 11 11  ALKPHOS 106 85  BILITOT 1.0 0.8  PROT 7.2 6.2*  ALBUMIN 2.8* 2.4*    No results for input(s): LIPASE, AMYLASE in the last 168 hours. No results for input(s): AMMONIA in the  last 168 hours. Coagulation Profile: Recent Labs  Lab 05/08/21 0923  INR 1.4*    Cardiac Enzymes: No results for input(s): CKTOTAL, CKMB, CKMBINDEX, TROPONINI in the last 168 hours. BNP (last 3 results) No results for input(s): PROBNP in the last 8760 hours. HbA1C: No results for input(s): HGBA1C in the last 72 hours. CBG: Recent Labs  Lab 05/10/21 0608 05/10/21 1220 05/10/21 1646 05/10/21 2111 05/11/21 0600  GLUCAP 110* 162* 147* 166* 121*    Lipid Profile: No results for input(s): CHOL, HDL, LDLCALC, TRIG, CHOLHDL, LDLDIRECT in the last 72 hours. Thyroid Function Tests: No results for input(s): TSH, T4TOTAL, FREET4, T3FREE, THYROIDAB in the last 72 hours. Anemia Panel: No results for input(s): VITAMINB12, FOLATE, FERRITIN, TIBC, IRON, RETICCTPCT in the last 72 hours. Sepsis Labs: No results for input(s): PROCALCITON, LATICACIDVEN in the last 168 hours.  Recent Results (from the past 240 hour(s))  Resp Panel by RT-PCR (Flu A&B, Covid) Nasopharyngeal Swab     Status: None   Collection Time: 05/05/21 11:34 AM   Specimen: Nasopharyngeal Swab; Nasopharyngeal(NP) swabs in vial transport medium  Result Value Ref Range Status   SARS Coronavirus 2 by RT PCR NEGATIVE NEGATIVE Final    Comment: (NOTE) SARS-CoV-2 target nucleic acids are NOT DETECTED.  The SARS-CoV-2 RNA is generally detectable in upper respiratory specimens during the acute  phase of infection. The lowest concentration of SARS-CoV-2 viral copies this assay can detect is 138 copies/mL. A negative result does not preclude SARS-Cov-2 infection and should not be used as the sole basis for treatment or other patient management decisions. A negative result may occur with  improper specimen collection/handling, submission of specimen other than nasopharyngeal swab, presence of viral mutation(s) within the areas targeted by this assay, and inadequate number of viral copies(<138 copies/mL). A negative result must be combined with clinical observations, patient history, and epidemiological information. The expected result is Negative.  Fact Sheet for Patients:  EntrepreneurPulse.com.au  Fact Sheet for Healthcare Providers:  IncredibleEmployment.be  This test is no t yet approved or cleared by the [place] FDA and  has been authorized for detection and/or diagnosis of SARS-CoV-2 by FDA under an Emergency Use Authorization (EUA). This EUA will remain  in effect (meaning this test can be used) for the duration of the COVID-19 declaration under Section 564(b)(1) of the Act, 21 U.S.C.section 360bbb-3(b)(1), unless the authorization is terminated  or revoked sooner.       Influenza A by PCR NEGATIVE NEGATIVE Final   Influenza B by PCR NEGATIVE NEGATIVE Final    Comment: (NOTE) The Xpert Xpress SARS-CoV-2/FLU/RSV plus assay is intended as an aid in the diagnosis of influenza from Nasopharyngeal swab specimens and should not be used as a sole basis for treatment. Nasal washings and aspirates are unacceptable for Xpert Xpress SARS-CoV-2/FLU/RSV testing.  Fact Sheet for Patients: EntrepreneurPulse.com.au  Fact Sheet for Healthcare Providers: IncredibleEmployment.be  This test is not yet approved or cleared by the [place] FDA and has been authorized for detection and/or diagnosis of  SARS-CoV-2 by FDA under an Emergency Use Authorization (EUA). This EUA will remain in effect (meaning this test can be used) for the duration of the COVID-19 declaration under Section 564(b)(1) of the Act, 21 U.S.C. section 360bbb-3(b)(1), unless the authorization is terminated or revoked.  Performed at [facility], [place] [address], [place], [place] [address]           Radiology Studies: No results found.      Scheduled  Meds:  ALPRAZolam  0.25 mg Oral QHS   aspirin  81 mg Oral QHS   atorvastatin  20 mg Oral Daily   cholecalciferol  2,000 Units Oral Q M,W,F-2000   colchicine  0.3 mg Oral Daily   febuxostat  40 mg Oral Daily   ferrous sulfate  325 mg Oral Q M,W,F   insulin aspart  0-9 Units Subcutaneous TID WC   latanoprost  1 drop Both Eyes QHS   multivitamin with minerals  1 tablet Oral Daily   mupirocin ointment   Nasal BID   sodium chloride flush  3 mL Intravenous Q12H   tamsulosin  0.4 mg Oral QHS   Continuous Infusions:  sodium chloride     sodium chloride     furosemide (LASIX) 200 mg in dextrose 5% 100 mL (2mg /mL) infusion 10 mg/hr (05/10/21 2009)     LOS: 6 days    Time spent:35 minutes.     Elmarie Shiley, MD [facility]   If 7PM-7AM, please contact night-coverage [email or web address]  05/11/2021, 8:10 AM

## 2021-05-11 NOTE — Progress Notes (Signed)
Progress Note  Patient Name: Scott [name] Date of Encounter: 05/11/2021  [facility] Cardiologist: Peter Martinique, MD   Subjective   Feeling a little better.  Breathing is a little better today.   Inpatient Medications    Scheduled Meds:  ALPRAZolam  0.25 mg Oral QHS   aspirin  81 mg Oral QHS   atorvastatin  20 mg Oral Daily   cholecalciferol  2,000 Units Oral Q M,W,F-2000   colchicine  0.3 mg Oral Daily   febuxostat  40 mg Oral Daily   ferrous sulfate  325 mg Oral Q M,W,F   insulin aspart  0-9 Units Subcutaneous TID WC   latanoprost  1 drop Both Eyes QHS   multivitamin with minerals  1 tablet Oral Daily   mupirocin ointment   Nasal BID   sodium chloride flush  3 mL Intravenous Q12H   tamsulosin  0.4 mg Oral QHS   Continuous Infusions:  sodium chloride     furosemide (LASIX) 200 mg in dextrose 5% 100 mL (2mg /mL) infusion 10 mg/hr (05/11/21 1138)   PRN Meds: sodium chloride, acetaminophen **OR** acetaminophen, docusate sodium, fluticasone, nitroGLYCERIN, oxyCODONE, sodium chloride flush   Vital Signs    Vitals:   05/11/21 1029 05/11/21 1034 05/11/21 1039 05/11/21 1105  BP: 114/67 126/64 120/68 109/60  Pulse: 75 68 71 77  Resp: (!) 38 (!) 4 12 17   Temp:    98.4 F (36.9 C)  TempSrc:    Oral  SpO2: 100% 100% 100% 97%  Weight:      Height:        Intake/Output Summary (Last 24 hours) at 05/11/2021 1304 Last data filed at 05/11/2021 0924 Gross per 24 hour  Intake 617.88 ml  Output 1983 ml  Net -1365.12 ml   Last 3 Weights 05/11/2021 05/10/2021 05/09/2021  Weight (lbs) 216 lb 14.4 oz 220 lb 11.2 oz 222 lb 11.2 oz  Weight (kg) 98.385 kg 100.109 kg 101.016 kg      Telemetry    Sinus rhythm.  First-degree AV block.  5 beats NSVT.  PVCs.- Personally Reviewed  ECG    N/a - Personally Reviewed  Physical Exam   VS:  BP 109/60 (BP Location: Right Arm)   Pulse 77   Temp 98.4 F (36.9 C) (Oral)   Resp 17   Ht 6' (1.829 m)   Wt 98.4 kg   SpO2 97%   BMI 29.42  kg/m  , BMI Body mass index is 29.42 kg/m. GENERAL: Chronically ill-appearing HEENT: Pupils equal round and reactive, fundi not visualized, oral mucosa unremarkable NECK: + jugular venous distention.  JVP 12 mmHg waveform within normal limits, carotid upstroke brisk and symmetric, no bruits LUNGS:  Clear to auscultation bilaterally HEART:  mostly regular with occasional ectopy.  PMI not displaced or sustained,S1 and S2 within normal limits, no S3, no S4, no clicks, no rubs, no murmurs ABD:  Flat, positive bowel sounds normal in frequency in pitch, no bruits, no rebound, no guarding, no midline pulsatile mass, no hepatomegaly, no splenomegaly EXT: Legs wrapped.  2+ lower extremity edema SKIN:  No rashes no nodules NEURO:  Cranial nerves II through XII grossly intact, motor grossly intact throughout PSYCH:  Cognitively intact, oriented to person place and time   Labs    High Sensitivity Troponin:   No results for input(s): TROPONINIHS in the last 720 hours.     Chemistry Recent Labs  Lab 05/04/21 1931 05/05/21 1328 05/08/21 0923 05/09/21 0729 05/10/21 0300 05/11/21  0239  NA 134*   < > 135 137 136 135  K 4.4   < > 4.2 4.1 4.3 4.2  CL 96*   < > 99 99 96* 98  CO2 27   < > 26 29 29 27   GLUCOSE 169*   < > 155* 97 126* 132*  BUN 71*   < > 69* 69* 72* 75*  CREATININE 2.42*   < > 2.25* 2.29* 2.24* 2.29*  CALCIUM 8.6*   < > 8.4* 8.8* 8.9 8.8*  PROT 7.2  --  6.2*  --   --   --   ALBUMIN 2.8*  --  2.4*  --   --   --   AST 26  --  24  --   --   --   ALT 11  --  11  --   --   --   ALKPHOS 106  --  85  --   --   --   BILITOT 1.0  --  0.8  --   --   --   GFRNONAA 25*   < > 28* 27* 28* 27*  ANIONGAP 11   < > 10 9 11 10    < > = values in this interval not displayed.     Hematology Recent Labs  Lab 05/08/21 0923 05/09/21 0729 05/10/21 0300  WBC 5.0 5.2 6.1  RBC 3.15* 3.39* 3.48*  HGB 9.1* 9.7* 9.9*  HCT 30.6* 32.9* 33.4*  MCV 97.1 97.1 96.0  MCH 28.9 28.6 28.4  MCHC 29.7*  29.5* 29.6*  RDW 17.8* 17.5* 17.5*  PLT 120* 130* 140*    BNP Recent Labs  Lab 05/04/21 1931  BNP 2,626.7*     DDimer No results for input(s): DDIMER in the last 168 hours.   Radiology    PERIPHERAL VASCULAR CATHETERIZATION  Result Date: 05/11/2021 Images from the original result were not included. Patient name: Scott [name] MRN: [id number] DOB: [birth year] Sex: male 05/11/2021 Pre-operative Diagnosis: Peripheral arterial disease with left foot ulceration Post-operative diagnosis:  Same Surgeon:  Eda Paschal. Donzetta Matters, MD Procedure Performed: 1.  Ultrasound-guided cannulation right common femoral artery 2.  CO2 aortogram and left lower extremity angiography 3.  Selection of left popliteal artery and below the knee contrasted angiogram 4.  Mynx device closure right common femoral artery Indications: 87 year old male with new ulceration of his left foot.  He does have venous stasis ulcer of the right lower extremity he has undergone angiography there with no intervention.  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  He is indicated for angiography with possible intervention and given chronic kidney disease we will plan to use CO2. Findings: The aorta and iliac segments appear patent.  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  Bilateral renal arteries are patent.  Left common femoral artery does have some calcific disease as well as the SFA but there is no flow-limiting stenosis.  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  Below the knee we did evaluate with contrast there is a 50% stenosis of the anterior tibial artery at the takeoff but there is brisk flow all the way to the foot filling the arch only via the anterior tibial artery.  The peroneal and posterior tibial artery are not visualized.  No intervention was undertaken.  Procedure:  The patient was identified in the holding area and taken to room 8.  The patient was then placed supine  on  the table and prepped and draped in the usual sterile fashion.  A time out was called.  Ultrasound was used to evaluate the right common femoral artery.  This was noted to be somewhat ectatic.  The areas anesthetized 1% lidocaine cannulated micropuncture needle followed by wire and a sheath.  Images saved the permanent record.  Bentson wires placed followed by 5 [place] sheath.  Omni catheter was placed to the level of L1 aortogram performed with CO2.  We then crossed bifurcation with Omni catheter Bentson wire placed the Omni catheter in the left common femoral artery and left lower extremity angiography was performed with CO2.  We then used a Glidewire straight catheter to the level of the below-knee popliteal artery and used contrast with 2 injections below the knee with the above findings.  We elected for no intervention.  The catheter was removed.  Main device was deployed.  He tolerated procedure without any complication. Contrast: 10 cc Brandon C. Donzetta Matters, MD [facility] Office: [phone] Pager: [phone]    Cardiac Studies   Echo 04/10/21: 1. Left ventricular ejection fraction, by estimation, is 25 to 30%. The  left ventricle has severely decreased function. The left ventricle  demonstrates global hypokinesis. The left ventricular internal cavity size  was moderately dilated. Left  ventricular diastolic parameters are consistent with Grade II diastolic  dysfunction (pseudonormalization). Elevated left ventricular end-diastolic  pressure.   2. Right ventricular systolic function is severely reduced. The right  ventricular size is severely enlarged. There is severely elevated  pulmonary artery systolic pressure.   3. Left atrial size was severely dilated.   4. Right atrial size was moderately dilated.   5. The mitral valve is degenerative. Mild mitral valve regurgitation.   6. Tricuspid valve regurgitation is mild to moderate.   7. The aortic valve is  tricuspid. There is mild calcification of the  aortic valve. There is mild thickening of the aortic valve. Aortic valve  regurgitation is mild.   LE CO2 angiography 05/11/21: Findings: The aorta and iliac segments appear patent.  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  Bilateral renal arteries are patent.  Left common femoral artery does have some calcific disease as well as the SFA but there is no flow-limiting stenosis.  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  Below the knee we did evaluate with contrast there is a 50% stenosis of the anterior tibial artery at the takeoff but there is brisk flow all the way to the foot filling the arch only via the anterior tibial artery.  The peroneal and posterior tibial artery are not visualized.  No intervention was undertaken.              Patient Profile     87 y.o. male with CAD, chronic systolic and diastolic heart failure, pulmonary hypertension, hypertension, hyperlipidemia, diabetes, CKD 4, OSA on CPAP, and PAD admitted with acute on chronic systolic diastolic heart failure.  Assessment & Plan    # Acute on chronic systolic ingestive heart failure: # CKD IV: Biventricular failure.  LV EF 25 to 30%.  His home torsemide was increased and then he was converted to IV Lasix.  Yesterday he was started on a lasix drip and was net -700 mL.  Renal function stable but BUN rising.  Nephrology to advise today.  He remains volume overloaded.  #CAD status post CABG: # Hyperlipidemia: LIMA to LAD, SVG  to PLA, and SVG to PDA.  He currently has no anginal symptoms.  Continue aspirin and atorvastatin.  #Essential hypertension: Losartan has been held due to worsening renal dysfunction.  Blood pressure is stable.  #PAD: He has a left foot ulcer.  He went for CO2 angiography today and was found to have an ectatic aorta and common iliac arteries.  There was calcified disease in the left femoral and SFA but no  flow-limiting lesions.  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  Peroneal and posterior tibial arteries were not visualized.  He did not undergo any interventions.  Continue aspirin and statin.  Will add fasting lipids.  LDL goal <70.  For questions or updates, please contact [place] Please consult [email or web address] for contact info under        Signed, Skeet Latch, MD  05/11/2021, 1:04 PM

## 2021-05-11 NOTE — Therapy (Signed)
OT Cancellation Note  Patient Details Name: Scott [name] MRN: [id number] DOB: 24-Sep-1933   Cancelled Treatment:    Reason Eval/Treat Not Completed: Patient at procedure or test/ unavailable (Pt off floor at cath lab. Will check back as time and schedule allows for acute occupational therapy.)  Almyra Deforest, OTR/L 05/11/2021, 10:28 AM

## 2021-05-11 NOTE — Interval H&P Note (Signed)
History and Physical Interval Note:  05/11/2021 9:09 AM  Scott [name]  has presented today for surgery, with the diagnosis of L foot ulcer.  The various methods of treatment have been discussed with the patient and family. After consideration of risks, benefits and other options for treatment, the patient has consented to  Procedure(s): ABDOMINAL AORTOGRAM W/LOWER EXTREMITY (N/A) as a surgical intervention.  Duplex left lower extremity demonstrates monophasic waveforms in the SFA and distal with a toe pressure of 0.  The patient's history has been reviewed, patient examined, no change in status, stable for surgery.  I have reviewed the patient's chart and labs.  Questions were answered to the patient's satisfaction.     Servando Snare

## 2021-05-12 ENCOUNTER — Other Ambulatory Visit: Payer: Self-pay

## 2021-05-12 DIAGNOSIS — I2510 Atherosclerotic heart disease of native coronary artery without angina pectoris: Secondary | ICD-10-CM | POA: Diagnosis not present

## 2021-05-12 DIAGNOSIS — I70244 Atherosclerosis of native arteries of left leg with ulceration of heel and midfoot: Secondary | ICD-10-CM | POA: Diagnosis not present

## 2021-05-12 DIAGNOSIS — I10 Essential (primary) hypertension: Secondary | ICD-10-CM | POA: Diagnosis not present

## 2021-05-12 DIAGNOSIS — N184 Chronic kidney disease, stage 4 (severe): Secondary | ICD-10-CM | POA: Diagnosis not present

## 2021-05-12 DIAGNOSIS — I5023 Acute on chronic systolic (congestive) heart failure: Secondary | ICD-10-CM | POA: Diagnosis not present

## 2021-05-12 LAB — BASIC METABOLIC PANEL
Anion gap: 11 (ref 5–15)
BUN: 76 mg/dL — ABNORMAL HIGH (ref 8–23)
CO2: 31 mmol/L (ref 22–32)
Calcium: 8.9 mg/dL (ref 8.9–10.3)
Chloride: 94 mmol/L — ABNORMAL LOW (ref 98–111)
Creatinine, Ser: 2.41 mg/dL — ABNORMAL HIGH (ref 0.61–1.24)
GFR, Estimated: 26 mL/min — ABNORMAL LOW (ref >60)
Glucose, Bld: 138 mg/dL — ABNORMAL HIGH (ref 70–99)
Potassium: 4 mmol/L (ref 3.5–5.1)
Sodium: 136 mmol/L (ref 135–145)

## 2021-05-12 LAB — GLUCOSE, CAPILLARY
Glucose-Capillary: 122 mg/dL — ABNORMAL HIGH (ref 70–99)
Glucose-Capillary: 170 mg/dL — ABNORMAL HIGH (ref 70–99)
Glucose-Capillary: 160 mg/dL — ABNORMAL HIGH (ref 70–99)
Glucose-Capillary: 184 mg/dL — ABNORMAL HIGH (ref 70–99)

## 2021-05-12 LAB — CBC
HCT: 31.5 % — ABNORMAL LOW (ref 39.0–52.0)
Hemoglobin: 9.6 g/dL — ABNORMAL LOW (ref 13.0–17.0)
MCH: 29.3 pg (ref 26.0–34.0)
MCHC: 30.5 g/dL (ref 30.0–36.0)
MCV: 96 fL (ref 80.0–100.0)
Platelets: 122 10*3/uL — ABNORMAL LOW (ref 150–400)
RBC: 3.28 MIL/uL — ABNORMAL LOW (ref 4.22–5.81)
RDW: 17.6 % — ABNORMAL HIGH (ref 11.5–15.5)
WBC: 5.3 10*3/uL (ref 4.0–10.5)
nRBC: 0 % (ref 0.0–0.2)

## 2021-05-12 LAB — LIPID PANEL
Cholesterol: 57 mg/dL (ref 0–200)
HDL: 29 mg/dL — ABNORMAL LOW (ref >40)
LDL Cholesterol: 20 mg/dL (ref 0–99)
Total CHOL/HDL Ratio: 2 RATIO
Triglycerides: 39 mg/dL (ref <150)
VLDL: 8 mg/dL (ref 0–40)

## 2021-05-12 NOTE — Progress Notes (Signed)
Physical Therapy Treatment Patient Details Name: Scott [name] MRN: [id number] DOB: [birth year] Today's Date: 05/12/2021    History of Present Illness Pt is an 87 y.o. male admitted 05/04/2021 with weight gain, LE edema and SOB. Workup for acute on chronic heart failure exacerbation. Pt also with L foot ulceration; s/p LLE angiography, aortogram 8/1. PMH includes chronic systolic heart failure, CAD s/p CABG, HTN, CKD stage IV, HLD, GIB, anemia, gout, thrombocytopenia, PAD.   PT Comments    Pt progressing with mobility. Today's session focused on activity to improve activity tolerance; pt mobilizing with rollator and intermittent min guard for balance, DOE 3/4. Pt remains limited by generalized weakness, decreased activity tolerance and impaired balance strategies/postural reactions. Will continue to follow acutely to address established goals.    Follow Up Recommendations  Home health PT;Supervision - Intermittent     Equipment Recommendations  None recommended by PT    Recommendations for Other Services       Precautions / Restrictions Precautions Precautions: Fall Restrictions Weight Bearing Restrictions: No    Mobility  Bed Mobility Overal bed mobility: Modified Independent             General bed mobility comments: HOB elevated    Transfers Overall transfer level: Needs assistance Equipment used: 4-wheeled walker Transfers: Sit to/from Stand Sit to Stand: Min guard         General transfer comment: Reliant on momentum to power into standing, min guard for balance; cues to lock rollator brakes  Ambulation/Gait Ambulation/Gait assistance: Min Gaffer (Feet): 180 Feet Assistive device: 4-wheeled walker Gait Pattern/deviations: Step-through pattern;Decreased stride length;Trunk flexed Gait velocity: Decreased   General Gait Details: Slow, mildly unsteady gait with rollator and intermittent min guard for balance; pt self-correcting forward  flexed posture at times; DOE 3/4; further distance limited by fatigue   Stairs Stairs:  (pt declined)           Wheelchair Mobility    Modified Rankin (Stroke Patients Only)       Balance Overall balance assessment: Needs assistance Sitting-balance support: No upper extremity supported;Feet supported Sitting balance-Leahy Scale: Good     Standing balance support: Bilateral upper extremity supported;Single extremity supported;During functional activity Standing balance-Leahy Scale: Poor Standing balance comment: Reliant on at least single UE support to maintain balance with static standing                            Cognition Arousal/Alertness: Awake/alert Behavior During Therapy: WFL for tasks assessed/performed;Flat affect Overall Cognitive Status: Within Functional Limits for tasks assessed                                 General Comments: WFL for simple tasks, seems to have increased fatigue this session      Exercises      General Comments General comments (skin integrity, edema, etc.): SpO2 96-100% on RA      Pertinent Vitals/Pain Pain Assessment: Faces Faces Pain Scale: Hurts a little bit Pain Location: Bilateral feet ("they always hurt") Pain Descriptors / Indicators: Discomfort Pain Intervention(s): Monitored during session;Other (comment) (improved with wearing shoes to walk)    Home Living                      Prior Function            PT Goals (current goals can now  be found in the care plan section) Progress towards PT goals: Progressing toward goals    Frequency    Min 3X/week      PT Plan Current plan remains appropriate    Co-evaluation              AM-PAC PT "6 Clicks" Mobility   Outcome Measure  Help needed turning from your back to your side while in a flat bed without using bedrails?: None Help needed moving from lying on your back to sitting on the side of a flat bed without using  bedrails?: None Help needed moving to and from a bed to a chair (including a wheelchair)?: A Little Help needed standing up from a chair using your arms (e.g., wheelchair or bedside chair)?: A Little Help needed to walk in hospital room?: A Little Help needed climbing 3-5 steps with a railing? : A Little 6 Click Score: 20    End of Session Equipment Utilized During Treatment: Gait belt Activity Tolerance: Patient tolerated treatment well;Patient limited by fatigue Patient left: in chair;with call bell/phone within reach;with chair alarm set Nurse Communication: Mobility status PT Visit Diagnosis: Other abnormalities of gait and mobility (R26.89)     Time: 2778-2423 PT Time Calculation (min) (ACUTE ONLY): 22 min  Charges:  $Therapeutic Exercise: 8-22 mins                     Mabeline Caras, PT, DPT Acute Rehabilitation Services  Pager [phone] Office [place] 05/12/2021, 2:24 PM

## 2021-05-12 NOTE — Progress Notes (Signed)
   05/12/21 1100  Mobility  Activity Ambulated in hall;Ambulated in room  Range of Motion/Exercises Active;All extremities  Level of Assistance Independent  Assistive Device Front wheel walker  Minutes Stood 5 minutes  Minutes Ambulated 5 minutes  Distance Ambulated (ft) 100 ft  Mobility Ambulated independently in hallway  Mobility Response Tolerated well  Mobility performed by Mobility specialist  Bed Position Semi-fowlers  $Mobility charge 1 Mobility

## 2021-05-12 NOTE — Progress Notes (Signed)
RT placed pt on CPAP dreamstation for the night on autotitrate max 12 min 6 w/3 Lpm bled into the system. Pt respiratory status stable at this time w/no distress noted. RT will continue to monitor.

## 2021-05-12 NOTE — Progress Notes (Signed)
PROGRESS NOTE    Scott [name]  OVF:[id number] DOB: 12-21-1932 DOA: 05/04/2021 PCP: Reubin Milan, MD   Brief Narrative: 87 year old with past medical history significant for chronic systolic heart failure, CAD status post CABG stent, hypertension, CKD stage IV, HLD, GI bleed, chronic iron deficiency anemia, gout, thrombocytopenia, PAD who presents to the ER complaining of weight gain, edema worsening shortness of breath.  He was admitted to chronic few weeks ago and left with a weight of 210 pounds.  He has gained 15 pounds.  He has been taking his torsemide 20 mg twice daily.  He increase this to 40 mg twice daily a week ago.  On 7/24 EF is torsemide was increased to 80 mg in the morning and 40 in the afternoon per cardiology note.  Patient admitted with acute on chronic heart failure exacerbation. Responding slowly to diuresis.    Assessment & Plan:   Principal Problem:   Acute exacerbation of CHF (congestive heart failure) (HCC) Active Problems:   Hypertension   Dyslipidemia   Arteriosclerotic coronary artery disease   Diabetes mellitus, type II, insulin dependent (HCC)   OSA (obstructive sleep apnea)   PAD (peripheral artery disease) (HCC)   CKD (chronic kidney disease) stage 4, GFR 15-29 ml/min (HCC)   Iron deficiency anemia   Thrombocytopenia (HCC)   Prolonged QT interval   1-Acute on Chronic Systolic and Diastolic Heart Failure Exacerbation: -Patient presents with creatinine of 2.2, BNP 2626, chest x-ray with pulmonary congestion. -Recent echo 04/2021 ejection fraction 25 to 30% with global hypokinesis, elevated pulmonary systolic pressure. -Dry weight 208--210.  Presented with a weight of 225 -Cardiology  has been consulted. -Weight : 221---222--221--222--213 -Received metolazone 7/30. No significant urine out put.  -Will need to reassess home diuretics dose and options at discharge.  -Continue with lasix Gtt.   -monitor renal function.   2-Hypertension: On Lasix.    CAD: Continue with aspirin and statins Beta-blocker discontinued last hospitalization due to AV block and bradycardia  4-Diabetes type 2 insulin-dependent He is 70/30 as a sliding scale at home SSI  Gout; On lower dose colchicine due to Renal function.   LE edema, chronic wound. Left ulceration left foot. PAD Appreciate wound care evaluation recommendation.  Continue with oxycodone PRN>  Vascular consulted. Plan for CO2 angiogram  on Monday.  ABI: Right: Resting right ankle-brachial index indicates moderate right lower  extremity arterial disease. Left: Resting left ankle-brachial index is within normal range. No  evidence of significant left lower extremity arterial disease.  Underwent arteriogram; aorta and iliac segment appear patent.  Ectatic aorta and bilateral common iliac arteries.  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  50% stenosis of the anterior tibial artery.  Peroneal and posterior tibial artery are not visualized.  No intervention was undertaken.  Dyslipidemia: Continue with a statin.  CKD Stage IV: Monitor renal function on IV diuresis.   Iron deficiency anemia: Stable.  Thrombocytopenia chronic, stable.   PAD: Follow-up with Dr. Oneida Alar.  GERD: Continue with PPI.  Abdominal Distension: possible Cirrhosis by [place].  [place] ordered. Small to moderate ascites  lower quadrant.  Plan to continue with IV lasix.   Obesity;  Needs life style modifications.   Estimated body mass index is 28.98 kg/m as calculated from the following:   Height as of this encounter: 6' (1.829 m).   Weight as of this encounter: 96.9 kg.   DVT prophylaxis: SCDs Code Status: DNR Family Communication: Care discussed with patient and brother who was at bedside  8/02. Disposition Plan:  Status is: Inpatient  Remains inpatient appropriate because:IV treatments appropriate due to intensity of illness or inability to take PO  Dispo: The patient is from: Home              Anticipated d/c is  to: Home              Patient currently is not medically stable to d/c.   Difficult to place patient No        Consultants:  Cardiology   Procedures:  None  Antimicrobials:  None  Subjective: He is breathing better. Still with significant LE edema and abdominal distension.    Objective: Vitals:   05/11/21 1931 05/11/21 2222 05/12/21 0459 05/12/21 0503  BP: 126/76   97/80  Pulse: 78   78  Resp: 16   (!) 8  Temp: 98 F (36.7 C)   97.9 F (36.6 C)  TempSrc: Oral   Oral  SpO2: 99% 99%  96%  Weight:   96.9 kg   Height:        Intake/Output Summary (Last 24 hours) at 05/12/2021 0719 Last data filed at 05/12/2021 0501 Gross per 24 hour  Intake 590.55 ml  Output 1975 ml  Net -1384.45 ml    Filed Weights   05/10/21 0436 05/11/21 0500 05/12/21 0459  Weight: 100.1 kg 98.4 kg 96.9 kg    Examination:  General exam: NAD Respiratory system: BL crackles.  Cardiovascular system: S 1, S 2 RRR Gastrointestinal system: BS present, soft, nt Central nervous system: Alert, follows command.  Extremities: BL LE with edema, dressing, left foot open wound  Data Reviewed: I have personally reviewed following labs and imaging studies  CBC: Recent Labs  Lab 05/07/21 0909 05/08/21 0923 05/09/21 0729 05/10/21 0300 05/12/21 0338  WBC 5.2 5.0 5.2 6.1 5.3  HGB 9.4* 9.1* 9.7* 9.9* 9.6*  HCT 31.2* 30.6* 32.9* 33.4* 31.5*  MCV 96.3 97.1 97.1 96.0 96.0  PLT 104* 120* 130* 140* 122*    Basic Metabolic Panel: Recent Labs  Lab 05/05/21 1328 05/06/21 0215 05/08/21 0923 05/09/21 0729 05/10/21 0300 05/11/21 0239 05/12/21 0338  NA 137   < > 135 137 136 135 136  K 4.0   < > 4.2 4.1 4.3 4.2 4.0  CL 100   < > 99 99 96* 98 94*  CO2 27   < > 26 29 29 27 31   GLUCOSE 128*   < > 155* 97 126* 132* 138*  BUN 69*   < > 69* 69* 72* 75* 76*  CREATININE 2.13*   < > 2.25* 2.29* 2.24* 2.29* 2.41*  CALCIUM 8.5*   < > 8.4* 8.8* 8.9 8.8* 8.9  MG 2.0  --   --   --   --   --   --    < > =  values in this interval not displayed.    GFR: Estimated Creatinine Clearance: 26.5 mL/min (A) (by C-G formula based on SCr of 2.41 mg/dL (H)). Liver Function Tests: Recent Labs  Lab 05/08/21 0923  AST 24  ALT 11  ALKPHOS 85  BILITOT 0.8  PROT 6.2*  ALBUMIN 2.4*    No results for input(s): LIPASE, AMYLASE in the last 168 hours. No results for input(s): AMMONIA in the last 168 hours. Coagulation Profile: Recent Labs  Lab 05/08/21 0923  INR 1.4*    Cardiac Enzymes: No results for input(s): CKTOTAL, CKMB, CKMBINDEX, TROPONINI in the last 168 hours. BNP (last 3 results) No  results for input(s): PROBNP in the last 8760 hours. HbA1C: No results for input(s): HGBA1C in the last 72 hours. CBG: Recent Labs  Lab 05/11/21 0600 05/11/21 1106 05/11/21 1615 05/11/21 2044 05/12/21 0603  GLUCAP 121* 102* 151* 168* 122*    Lipid Profile: Recent Labs    05/12/21 0338  CHOL 57  HDL 29*  LDLCALC 20  TRIG 39  CHOLHDL 2.0   Thyroid Function Tests: No results for input(s): TSH, T4TOTAL, FREET4, T3FREE, THYROIDAB in the last 72 hours. Anemia Panel: No results for input(s): VITAMINB12, FOLATE, FERRITIN, TIBC, IRON, RETICCTPCT in the last 72 hours. Sepsis Labs: No results for input(s): PROCALCITON, LATICACIDVEN in the last 168 hours.  Recent Results (from the past 240 hour(s))  Resp Panel by RT-PCR (Flu A&B, Covid) Nasopharyngeal Swab     Status: None   Collection Time: 05/05/21 11:34 AM   Specimen: Nasopharyngeal Swab; Nasopharyngeal(NP) swabs in vial transport medium  Result Value Ref Range Status   SARS Coronavirus 2 by RT PCR NEGATIVE NEGATIVE Final    Comment: (NOTE) SARS-CoV-2 target nucleic acids are NOT DETECTED.  The SARS-CoV-2 RNA is generally detectable in upper respiratory specimens during the acute phase of infection. The lowest concentration of SARS-CoV-2 viral copies this assay can detect is 138 copies/mL. A negative result does not preclude  SARS-Cov-2 infection and should not be used as the sole basis for treatment or other patient management decisions. A negative result may occur with  improper specimen collection/handling, submission of specimen other than nasopharyngeal swab, presence of viral mutation(s) within the areas targeted by this assay, and inadequate number of viral copies(<138 copies/mL). A negative result must be combined with clinical observations, patient history, and epidemiological information. The expected result is Negative.  Fact Sheet for Patients:  EntrepreneurPulse.com.au  Fact Sheet for Healthcare Providers:  IncredibleEmployment.be  This test is no t yet approved or cleared by the [place] FDA and  has been authorized for detection and/or diagnosis of SARS-CoV-2 by FDA under an Emergency Use Authorization (EUA). This EUA will remain  in effect (meaning this test can be used) for the duration of the COVID-19 declaration under Section 564(b)(1) of the Act, 21 U.S.C.section 360bbb-3(b)(1), unless the authorization is terminated  or revoked sooner.       Influenza A by PCR NEGATIVE NEGATIVE Final   Influenza B by PCR NEGATIVE NEGATIVE Final    Comment: (NOTE) The Xpert Xpress SARS-CoV-2/FLU/RSV plus assay is intended as an aid in the diagnosis of influenza from Nasopharyngeal swab specimens and should not be used as a sole basis for treatment. Nasal washings and aspirates are unacceptable for Xpert Xpress SARS-CoV-2/FLU/RSV testing.  Fact Sheet for Patients: EntrepreneurPulse.com.au  Fact Sheet for Healthcare Providers: IncredibleEmployment.be  This test is not yet approved or cleared by the [place] FDA and has been authorized for detection and/or diagnosis of SARS-CoV-2 by FDA under an Emergency Use Authorization (EUA). This EUA will remain in effect (meaning this test can be used) for the duration of  the COVID-19 declaration under Section 564(b)(1) of the Act, 21 U.S.C. section 360bbb-3(b)(1), unless the authorization is terminated or revoked.  Performed at [facility], [place] [address], [place], [place] [address]           Radiology Studies: PERIPHERAL VASCULAR CATHETERIZATION  Result Date: 05/11/2021 Images from the original result were not included. Patient name: Scott [name] MRN: [id number] DOB: 08-07-1933 Sex: male 05/11/2021 Pre-operative Diagnosis: Peripheral arterial disease with left foot ulceration Post-operative  diagnosis:  Same Surgeon:  Eda Paschal. Donzetta Matters, MD Procedure Performed: 1.  Ultrasound-guided cannulation right common femoral artery 2.  CO2 aortogram and left lower extremity angiography 3.  Selection of left popliteal artery and below the knee contrasted angiogram 4.  Mynx device closure right common femoral artery Indications: 87 year old male with new ulceration of his left foot.  He does have venous stasis ulcer of the right lower extremity he has undergone angiography there with no intervention.  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  He is indicated for angiography with possible intervention and given chronic kidney disease we will plan to use CO2. Findings: The aorta and iliac segments appear patent.  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  Bilateral renal arteries are patent.  Left common femoral artery does have some calcific disease as well as the SFA but there is no flow-limiting stenosis.  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  Below the knee we did evaluate with contrast there is a 50% stenosis of the anterior tibial artery at the takeoff but there is brisk flow all the way to the foot filling the arch only via the anterior tibial artery.  The peroneal and posterior tibial artery are not visualized.  No intervention was undertaken.  Procedure:  The patient was identified in the holding  area and taken to room 8.  The patient was then placed supine on the table and prepped and draped in the usual sterile fashion.  A time out was called.  Ultrasound was used to evaluate the right common femoral artery.  This was noted to be somewhat ectatic.  The areas anesthetized 1% lidocaine cannulated micropuncture needle followed by wire and a sheath.  Images saved the permanent record.  Bentson wires placed followed by 5 [place] sheath.  Omni catheter was placed to the level of L1 aortogram performed with CO2.  We then crossed bifurcation with Omni catheter Bentson wire placed the Omni catheter in the left common femoral artery and left lower extremity angiography was performed with CO2.  We then used a Glidewire straight catheter to the level of the below-knee popliteal artery and used contrast with 2 injections below the knee with the above findings.  We elected for no intervention.  The catheter was removed.  Main device was deployed.  He tolerated procedure without any complication. Contrast: 10 cc Brandon C. Donzetta Matters, MD [facility] Office: [phone] Pager: [phone]        Scheduled Meds:  ALPRAZolam  0.25 mg Oral QHS   aspirin  81 mg Oral QHS   atorvastatin  20 mg Oral Daily   cholecalciferol  2,000 Units Oral Q M,W,F-2000   colchicine  0.3 mg Oral Daily   febuxostat  40 mg Oral Daily   ferrous sulfate  325 mg Oral Q M,W,F   insulin aspart  0-9 Units Subcutaneous TID WC   latanoprost  1 drop Both Eyes QHS   multivitamin with minerals  1 tablet Oral Daily   mupirocin ointment   Nasal BID   sodium chloride flush  3 mL Intravenous Q12H   tamsulosin  0.4 mg Oral QHS   Continuous Infusions:  sodium chloride     furosemide (LASIX) 200 mg in dextrose 5% 100 mL (2mg /mL) infusion 10 mg/hr (05/11/21 1943)     LOS: 7 days    Time spent:35 minutes.     Elmarie Shiley, MD [facility]   If 7PM-7AM, please contact  night-coverage [email or web address]  05/12/2021, 7:19 AM

## 2021-05-12 NOTE — Patient Outreach (Signed)
[facility]) Care Management  05/12/2021  Scott [name] 1932/11/07 [id number]   Patient noted to be hospitalized.  RN CM will outreach patient telephonically when appropriate.    Jone Baseman, RN, MSN [place] Management Care Management Coordinator Direct Line [phone] Cell [phone] Toll Free: [phone]  Fax: [phone]

## 2021-05-12 NOTE — Progress Notes (Signed)
Occupational Therapy Treatment Patient Details Name: Scott [name] MRN: [id number] DOB: 05/21/1933 Today's Date: 05/12/2021    History of present illness 87 y.o. male presents to [facility] ED on 05/04/2021 with weight gain, LE edema and SOB. Pt admitted for acute on chronic heart failure exacerbation. PMH includes chronic systolic heart failure, CAD status post CABG stent, hypertension, CKD stage IV, HLD, GI bleed, chronic iron deficiency anemia, gout, thrombocytopenia, PAD.   OT comments  Pt. Was cooperative with therapy. Pt. States he was able to preform ADLs at home but at times it was difficult. Pt. Ed on AE for LE ADLs. Pt. Was able to follow directions on how to use AE. Pt. Will need additional training. Acute OT to follow.   Follow Up Recommendations  Home health OT;Supervision - Intermittent    Equipment Recommendations  None recommended by OT    Recommendations for Other Services      Precautions / Restrictions Precautions Precautions: Fall Restrictions Weight Bearing Restrictions: No       Mobility Bed Mobility Overal bed mobility: Modified Independent                  Transfers Overall transfer level: Needs assistance Equipment used: Rolling walker (2 wheeled) Transfers: Stand Pivot Transfers Sit to Stand: Min assist Stand pivot transfers: Min assist       General transfer comment: cues for walker safety    Balance     Sitting balance-Leahy Scale: Good       Standing balance-Leahy Scale: Fair                             ADL either performed or assessed with clinical judgement   ADL Overall ADL's : Needs assistance/impaired Eating/Feeding: Independent;Sitting   Grooming: Wash/dry hands;Wash/dry face;Standing;Min guard           Upper Body Dressing : Set up;Sitting   Lower Body Dressing: Moderate assistance;Sit to/from stand;With adaptive equipment   Toilet Transfer: Comfort height toilet;Ambulation;Minimal assistance   Toileting-  Clothing Manipulation and Hygiene: Sit to/from stand;Min guard       Functional mobility during ADLs: Minimal assistance;Rolling walker General ADL Comments: Pt. ed on use of AE for LE ADLs.     Vision   Vision Assessment?: No apparent visual deficits   Perception     Praxis      Cognition Arousal/Alertness: Awake/alert Behavior During Therapy: WFL for tasks assessed/performed Overall Cognitive Status: Within Functional Limits for tasks assessed                                          Exercises     Shoulder Instructions       General Comments      Pertinent Vitals/ Pain       Pain Assessment: No/denies pain  Home Living                                          Prior Functioning/Environment              Frequency  Min 2X/week        Progress Toward Goals  OT Goals(current goals can now be found in the care plan section)  Progress towards OT goals: Progressing toward goals  Acute Rehab OT Goals Patient Stated Goal: to improve activity tolerance and reduce SOB OT Goal Formulation: With patient Time For Goal Achievement: 05/21/21 Potential to Achieve Goals: Good ADL Goals Pt Will Perform Grooming: with modified independence;standing Pt Will Perform Lower Body Bathing: sit to/from stand;with adaptive equipment;with supervision Pt Will Perform Lower Body Dressing: with modified independence;with adaptive equipment;sit to/from stand Pt Will Transfer to Toilet: with modified independence;ambulating Pt Will Perform Tub/Shower Transfer: Tub transfer;with set-up;ambulating;shower seat Additional ADL Goal #1: Pt to verbalize at least 2 fall prevention strategies to implement in home environment.  Plan Discharge plan remains appropriate    Co-evaluation                 AM-PAC OT "6 Clicks" Daily Activity     Outcome Measure   Help from another person eating meals?: None Help from another person taking care of  personal grooming?: A Little Help from another person toileting, which includes using toliet, bedpan, or urinal?: A Little Help from another person bathing (including washing, rinsing, drying)?: A Lot Help from another person to put on and taking off regular upper body clothing?: A Little Help from another person to put on and taking off regular lower body clothing?: A Lot 6 Click Score: 17    End of Session Equipment Utilized During Treatment: Rolling walker;Gait belt  OT Visit Diagnosis: Unsteadiness on feet (R26.81);Other abnormalities of gait and mobility (R26.89)   Activity Tolerance Patient tolerated treatment well   Patient Left in chair;with chair alarm set;with family/visitor present;with call bell/phone within reach   Nurse Communication  (ok therapy)        Time: 1002-1030 OT Time Calculation (min): 28 min  Charges: OT General Charges $OT Visit: 1 Visit OT Treatments $Self Care/Home Management : 23-37 mins  Reece Packer OT/L    Scott [name] 05/12/2021, 10:48 AM

## 2021-05-12 NOTE — Progress Notes (Signed)
Nephrology Follow-Up Consult note   Assessment/Recommendations: Scott [name] is a/an 87 y.o. male with a past medical history significant for HFrEF, CAD s/p CABG, HTN, CKD, HLD, anemia, gout, PAD who present w/ CHF exacebration   CHF exacerbation: Volume overload.  Initially poor response to diuretics now improved with improving urine output and improving weight.  Cardiology following.  No AKI at this point.   -Patient is achieving net negative status on a regular basis -Continue IV diuretics per cardiology -Can increase dose as needed to achieve net negative status; if he remains on drip with bolus with each increase -Patient feels like his baseline weight is 91 to 93 kg -Expect Crt to rise with diuresis; this is ok if it stays in his baseline -Because of his improving volume status we will sign off and defer mgmt to cardiology and hospitalist   CAD status post CABG: Continue aspirin and statin   PAD: Vascular surgery following.  No intervention needed for left leg.  Continue aspirin and statin   CKD 4: Urinalysis historically negative for protein.  Likely significant arterionephrosclerosis and cardiorenal syndrome.  No AKI at this time.  Baseline appears to be 2.2-3.  Expect creatinine to rise towards the top end of his baseline with diuresis.  This is acceptable   Hypertension: On losartan at home.  Holding for now   We are signing off at this time.  Please notify us if further help is needed in the future.  Recommendations conveyed to primary service.    [facility] 05/12/2021 10:02 AM  ___________________________________________________________  CC: Heart failure exacerbation  Interval History/Subjective: Creatinine minimally changed at 2.4 today.  Urine output continues to be good with 2 L.  Weight is improving down to 97 kg.  Patient states that he feels pretty good and feels like the fluid is improving.   Medications:  Current  Facility-Administered Medications  Medication Dose Route Frequency Provider Last Rate Last Admin   0.9 %  sodium chloride infusion  250 mL Intravenous PRN Waynetta Sandy, MD       acetaminophen (TYLENOL) tablet 650 mg  650 mg Oral Q6H PRN Waynetta Sandy, MD   650 mg at 05/11/21 2142   Or   acetaminophen (TYLENOL) suppository 650 mg  650 mg Rectal Q6H PRN Waynetta Sandy, MD       ALPRAZolam Duanne Moron) tablet 0.25 mg  0.25 mg Oral QHS Waynetta Sandy, MD   0.25 mg at 05/11/21 2142   aspirin chewable tablet 81 mg  81 mg Oral QHS Waynetta Sandy, MD   81 mg at 05/11/21 2142   atorvastatin (LIPITOR) tablet 20 mg  20 mg Oral Daily Waynetta Sandy, MD   20 mg at 05/12/21 1610   cholecalciferol (VITAMIN D) tablet 2,000 Units  2,000 Units Oral Q M,W,F-2000 Waynetta Sandy, MD   2,000 Units at 05/11/21 1941   colchicine tablet 0.3 mg  0.3 mg Oral Daily Waynetta Sandy, MD   0.3 mg at 05/12/21 9604   docusate sodium (COLACE) capsule 100 mg  100 mg Oral Daily PRN Waynetta Sandy, MD       febuxostat Melburn Popper) tablet 40 mg  40 mg Oral Daily Waynetta Sandy, MD   40 mg at 05/12/21 5409   ferrous sulfate tablet 325 mg  325 mg Oral Q M,W,F Waynetta Sandy, MD   325 mg at 05/11/21 1115   fluticasone (FLONASE) 50 MCG/ACT nasal spray  1 spray  1 spray Each Nare Daily PRN Waynetta Sandy, MD       furosemide (LASIX) 200 mg in dextrose 5 % 100 mL (2 mg/mL) infusion  10 mg/hr Intravenous Continuous Waynetta Sandy, MD 5 mL/hr at 05/11/21 1943 10 mg/hr at 05/11/21 1943   insulin aspart (novoLOG) injection 0-9 Units  0-9 Units Subcutaneous TID WC Waynetta Sandy, MD   1 Units at 05/12/21 0629   latanoprost (XALATAN) 0.005 % ophthalmic solution 1 drop  1 drop Both Eyes QHS Waynetta Sandy, MD   1 drop at 05/11/21 2142   multivitamin with minerals tablet 1 tablet  1 tablet Oral  Daily Waynetta Sandy, MD   1 tablet at 05/12/21 9326   mupirocin ointment (BACTROBAN) 2 %   Nasal BID Waynetta Sandy, MD   Given at 05/12/21 [id number]   nitroGLYCERIN (NITROSTAT) SL tablet 0.4 mg  0.4 mg Sublingual Q5 min PRN Waynetta Sandy, MD       oxyCODONE (Oxy IR/ROXICODONE) immediate release tablet 5 mg  5 mg Oral Q6H PRN Waynetta Sandy, MD   5 mg at 05/12/21 0650   sodium chloride flush (NS) 0.9 % injection 3 mL  3 mL Intravenous Q12H Waynetta Sandy, MD   3 mL at 05/12/21 5809   sodium chloride flush (NS) 0.9 % injection 3 mL  3 mL Intravenous PRN Waynetta Sandy, MD   3 mL at 05/06/21 0856   tamsulosin (FLOMAX) capsule 0.4 mg  0.4 mg Oral QHS Waynetta Sandy, MD   0.4 mg at 05/11/21 2142      Review of Systems: 10 systems reviewed and negative except per interval history/subjective  Physical Exam: Vitals:   05/11/21 2222 05/12/21 0503  BP:  97/80  Pulse:  78  Resp:  (!) 8  Temp:  97.9 F (36.6 C)  SpO2: 99% 96%   Total I/O In: 240 [P.O.:240] Out: 375 [Urine:375]  Intake/Output Summary (Last 24 hours) at 05/12/2021 1002 Last data filed at 05/12/2021 9833 Gross per 24 hour  Intake 1138.37 ml  Output 1950 ml  Net -811.63 ml   Constitutional: well-appearing, no acute distress ENMT: ears and nose without scars or lesions, MMM CV: normal rate, 1+ pitting edema in the bilateral lower extremities Respiratory: Bilateral chest rise, normal work of breathing Gastrointestinal: soft, non-tender, no palpable masses or hernias Skin: Some venous stasis changes on the bilateral shins, otherwise no visible lesions or rashes Psych: alert, judgement/insight appropriate, appropriate mood and affect   Test Results I personally reviewed new and old clinical labs and radiology tests Lab Results  Component Value Date   NA 136 05/12/2021   K 4.0 05/12/2021   CL 94 (L) 05/12/2021   CO2 31 05/12/2021   BUN 76 (H)  05/12/2021   CREATININE 2.41 (H) 05/12/2021   GFR 48.04 (L) 08/06/2013   GLU 128 03/31/2021   CALCIUM 8.9 05/12/2021   ALBUMIN 2.4 (L) 05/08/2021   PHOS 3.9 10/18/2017

## 2021-05-12 NOTE — Progress Notes (Signed)
Progress Note  Patient Name: Scott [name] Date of Encounter: 05/12/2021  [facility] Cardiologist: Peter Martinique, MD   Subjective   Feeling a little better.  Breathing is a little better today.   Inpatient Medications    Scheduled Meds:  ALPRAZolam  0.25 mg Oral QHS   aspirin  81 mg Oral QHS   atorvastatin  20 mg Oral Daily   cholecalciferol  2,000 Units Oral Q M,W,F-2000   colchicine  0.3 mg Oral Daily   febuxostat  40 mg Oral Daily   ferrous sulfate  325 mg Oral Q M,W,F   insulin aspart  0-9 Units Subcutaneous TID WC   latanoprost  1 drop Both Eyes QHS   multivitamin with minerals  1 tablet Oral Daily   mupirocin ointment   Nasal BID   sodium chloride flush  3 mL Intravenous Q12H   tamsulosin  0.4 mg Oral QHS   Continuous Infusions:  sodium chloride     furosemide (LASIX) 200 mg in dextrose 5% 100 mL (2mg /mL) infusion 10 mg/hr (05/11/21 1943)   PRN Meds: sodium chloride, acetaminophen **OR** acetaminophen, docusate sodium, fluticasone, nitroGLYCERIN, oxyCODONE, sodium chloride flush   Vital Signs    Vitals:   05/11/21 2222 05/12/21 0459 05/12/21 0503 05/12/21 1106  BP:   97/80 131/67  Pulse:   78 71  Resp:   (!) 8 16  Temp:   97.9 F (36.6 C) 97.9 F (36.6 C)  TempSrc:   Oral   SpO2: 99%  96% 95%  Weight:  96.9 kg    Height:        Intake/Output Summary (Last 24 hours) at 05/12/2021 1308 Last data filed at 05/12/2021 1140 Gross per 24 hour  Intake 1138.37 ml  Output 2350 ml  Net -1211.63 ml   Last 3 Weights 05/12/2021 05/11/2021 05/10/2021  Weight (lbs) 213 lb 11.2 oz 216 lb 14.4 oz 220 lb 11.2 oz  Weight (kg) 96.934 kg 98.385 kg 100.109 kg      Telemetry    Sinus rhythm.  First-degree AV block.  5 beats NSVT.  PVCs.- Personally Reviewed  ECG    N/a - Personally Reviewed  Physical Exam   VS:  BP 131/67 (BP Location: Right Arm)   Pulse 71   Temp 97.9 F (36.6 C)   Resp 16   Ht 6' (1.829 m)   Wt 96.9 kg   SpO2 95%   BMI 28.98 kg/m  , BMI  Body mass index is 28.98 kg/m. GENERAL: Chronically ill-appearing HEENT: Pupils equal round and reactive, fundi not visualized, oral mucosa unremarkable NECK: + jugular venous distention.  JVP 12 mmHg waveform within normal limits, carotid upstroke brisk and symmetric, no bruits LUNGS:  Clear to auscultation bilaterally HEART:  mostly regular with occasional ectopy.  PMI not displaced or sustained,S1 and S2 within normal limits, no S3, no S4, no clicks, no rubs, no murmurs ABD:  Flat, positive bowel sounds normal in frequency in pitch, no bruits, no rebound, no guarding, no midline pulsatile mass, no hepatomegaly, no splenomegaly EXT: Legs wrapped.  2+ lower extremity edema SKIN:  No rashes no nodules NEURO:  Cranial nerves II through XII grossly intact, motor grossly intact throughout PSYCH:  Cognitively intact, oriented to person place and time   Labs    High Sensitivity Troponin:   No results for input(s): TROPONINIHS in the last 720 hours.     Chemistry Recent Labs  Lab 05/08/21 [phone] 05/09/21 0729 05/10/21 0300 05/11/21 0239 05/12/21  0338  NA 135   < > 136 135 136  K 4.2   < > 4.3 4.2 4.0  CL 99   < > 96* 98 94*  CO2 26   < > 29 27 31   GLUCOSE 155*   < > 126* 132* 138*  BUN 69*   < > 72* 75* 76*  CREATININE 2.25*   < > 2.24* 2.29* 2.41*  CALCIUM 8.4*   < > 8.9 8.8* 8.9  PROT 6.2*  --   --   --   --   ALBUMIN 2.4*  --   --   --   --   AST 24  --   --   --   --   ALT 11  --   --   --   --   ALKPHOS 85  --   --   --   --   BILITOT 0.8  --   --   --   --   GFRNONAA 28*   < > 28* 27* 26*  ANIONGAP 10   < > 11 10 11    < > = values in this interval not displayed.     Hematology Recent Labs  Lab 05/09/21 0729 05/10/21 0300 05/12/21 0338  WBC 5.2 6.1 5.3  RBC 3.39* 3.48* 3.28*  HGB 9.7* 9.9* 9.6*  HCT 32.9* 33.4* 31.5*  MCV 97.1 96.0 96.0  MCH 28.6 28.4 29.3  MCHC 29.5* 29.6* 30.5  RDW 17.5* 17.5* 17.6*  PLT 130* 140* 122*    BNP No results for input(s):  BNP, PROBNP in the last 168 hours.    DDimer No results for input(s): DDIMER in the last 168 hours.   Radiology    PERIPHERAL VASCULAR CATHETERIZATION  Result Date: 05/11/2021 Images from the original result were not included. Patient name: Scott [name] MRN: [id number] DOB: 07-07-1933 Sex: male 05/11/2021 Pre-operative Diagnosis: Peripheral arterial disease with left foot ulceration Post-operative diagnosis:  Same Surgeon:  Eda Paschal. Donzetta Matters, MD Procedure Performed: 1.  Ultrasound-guided cannulation right common femoral artery 2.  CO2 aortogram and left lower extremity angiography 3.  Selection of left popliteal artery and below the knee contrasted angiogram 4.  Mynx device closure right common femoral artery Indications: 87 year old male with new ulceration of his left foot.  He does have venous stasis ulcer of the right lower extremity he has undergone angiography there with no intervention.  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  He is indicated for angiography with possible intervention and given chronic kidney disease we will plan to use CO2. Findings: The aorta and iliac segments appear patent.  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  Bilateral renal arteries are patent.  Left common femoral artery does have some calcific disease as well as the SFA but there is no flow-limiting stenosis.  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  Below the knee we did evaluate with contrast there is a 50% stenosis of the anterior tibial artery at the takeoff but there is brisk flow all the way to the foot filling the arch only via the anterior tibial artery.  The peroneal and posterior tibial artery are not visualized.  No intervention was undertaken.  Procedure:  The patient was identified in the holding area and taken to room 8.  The patient was then placed supine on the table and prepped and draped in the usual sterile fashion.  A time  out was  called.  Ultrasound was used to evaluate the right common femoral artery.  This was noted to be somewhat ectatic.  The areas anesthetized 1% lidocaine cannulated micropuncture needle followed by wire and a sheath.  Images saved the permanent record.  Bentson wires placed followed by 5 [place] sheath.  Omni catheter was placed to the level of L1 aortogram performed with CO2.  We then crossed bifurcation with Omni catheter Bentson wire placed the Omni catheter in the left common femoral artery and left lower extremity angiography was performed with CO2.  We then used a Glidewire straight catheter to the level of the below-knee popliteal artery and used contrast with 2 injections below the knee with the above findings.  We elected for no intervention.  The catheter was removed.  Main device was deployed.  He tolerated procedure without any complication. Contrast: 10 cc Brandon C. Donzetta Matters, MD [facility] Office: [phone] Pager: [phone]    Cardiac Studies   Echo 04/10/21: 1. Left ventricular ejection fraction, by estimation, is 25 to 30%. The  left ventricle has severely decreased function. The left ventricle  demonstrates global hypokinesis. The left ventricular internal cavity size  was moderately dilated. Left  ventricular diastolic parameters are consistent with Grade II diastolic  dysfunction (pseudonormalization). Elevated left ventricular end-diastolic  pressure.   2. Right ventricular systolic function is severely reduced. The right  ventricular size is severely enlarged. There is severely elevated  pulmonary artery systolic pressure.   3. Left atrial size was severely dilated.   4. Right atrial size was moderately dilated.   5. The mitral valve is degenerative. Mild mitral valve regurgitation.   6. Tricuspid valve regurgitation is mild to moderate.   7. The aortic valve is tricuspid. There is mild calcification of the  aortic valve. There is mild thickening  of the aortic valve. Aortic valve  regurgitation is mild.   LE CO2 angiography 05/11/21: Findings: The aorta and iliac segments appear patent.  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  Bilateral renal arteries are patent.  Left common femoral artery does have some calcific disease as well as the SFA but there is no flow-limiting stenosis.  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  Below the knee we did evaluate with contrast there is a 50% stenosis of the anterior tibial artery at the takeoff but there is brisk flow all the way to the foot filling the arch only via the anterior tibial artery.  The peroneal and posterior tibial artery are not visualized.  No intervention was undertaken.              Patient Profile     87 y.o. male with CAD, chronic systolic and diastolic heart failure, pulmonary hypertension, hypertension, hyperlipidemia, diabetes, CKD 4, OSA on CPAP, and PAD admitted with acute on chronic systolic diastolic heart failure.  Assessment & Plan    # Acute on chronic systolic ingestive heart failure: # CKD IV: Biventricular failure.  LV EF 25 to 30%.  His home torsemide was increased and then he was converted to IV Lasix.  He was started on a lasix drip and was net -1L yesterday.  Renal function stable but BUN rising.  Appreciate Nephrology weighing in.  He remains volume overloaded.  OK to increase drip rate per nephrology if he isn't -1 to 2L.  # CAD status post CABG: # Hyperlipidemia: LIMA to LAD, SVG to  PLA, and SVG to PDA.  He currently has no anginal symptoms.  Continue aspirin and atorvastatin.  # Essential hypertension: Losartan has been held due to worsening renal dysfunction.  Blood pressure is stable.  # PAD: He has a left foot ulcer.  He went for CO2 angiography and was found to have an ectatic aorta and common iliac arteries.  There was calcified disease in the left femoral and SFA but no flow-limiting lesions.   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  Peroneal and posterior tibial arteries were not visualized.  He did not undergo any interventions.  Continue aspirin and statin.  Will add fasting lipids.  LDL goal <70.  For questions or updates, please contact [place] Please consult [email or web address] for contact info under        Signed, Skeet Latch, MD  05/12/2021, 1:09 PM

## 2021-05-12 NOTE — Plan of Care (Signed)
  Problem: Health Behavior/Discharge Planning: Goal: Ability to manage health-related needs will improve Outcome: Progressing   Problem: Education: Goal: Knowledge of General Education information will improve Description: Including pain rating scale, medication(s)/side effects and non-pharmacologic comfort measures Outcome: Progressing   Problem: Clinical Measurements: Goal: Ability to maintain clinical measurements within normal limits will improve Outcome: Progressing   Problem: Clinical Measurements: Goal: Diagnostic test results will improve Outcome: Progressing   Problem: Clinical Measurements: Goal: Respiratory complications will improve Outcome: Progressing   Problem: Clinical Measurements: Goal: Cardiovascular complication will be avoided Outcome: Progressing   Problem: Pain Managment: Goal: General experience of comfort will improve Outcome: Progressing   Problem: Safety: Goal: Ability to remain free from injury will improve Outcome: Progressing   Problem: Skin Integrity: Goal: Risk for impaired skin integrity will decrease Outcome: Progressing

## 2021-05-12 NOTE — Progress Notes (Signed)
  Progress Note    05/12/2021 9:22 AM 1 Day Post-Op  Subjective: No overnight issues  Vitals:   05/11/21 2222 05/12/21 0503  BP:  97/80  Pulse:  78  Resp:  (!) 8  Temp:  97.9 F (36.6 C)  SpO2: 99% 96%    Physical Exam: Awake alert oriented Unlabored respirations Right groin soft without hematoma  CBC    Component Value Date/Time   WBC 5.3 05/12/2021 0338   RBC 3.28 (L) 05/12/2021 0338   HGB 9.6 (L) 05/12/2021 0338   HGB 11.1 (L) 01/17/2020 1551   HCT 31.5 (L) 05/12/2021 0338   HCT 33.8 (L) 01/17/2020 1551   PLT 122 (L) 05/12/2021 0338   PLT 147 (L) 01/17/2020 1551   MCV 96.0 05/12/2021 0338   MCV 91 01/17/2020 1551   MCH 29.3 05/12/2021 0338   MCHC 30.5 05/12/2021 0338   RDW 17.6 (H) 05/12/2021 0338   RDW 13.5 01/17/2020 1551   LYMPHSABS 1.0 05/04/2021 1931   LYMPHSABS 1.3 01/17/2020 1551   MONOABS 1.2 (H) 05/04/2021 1931   EOSABS 0.2 05/04/2021 1931   EOSABS 0.6 (H) 01/17/2020 1551   BASOSABS 0.1 05/04/2021 1931   BASOSABS 0.1 01/17/2020 1551    BMET    Component Value Date/Time   NA 136 05/12/2021 0338   NA 137 04/27/2021 1136   K 4.0 05/12/2021 0338   CL 94 (L) 05/12/2021 0338   CO2 31 05/12/2021 0338   GLUCOSE 138 (H) 05/12/2021 0338   BUN 76 (H) 05/12/2021 0338   BUN 64 (H) 04/27/2021 1136   CREATININE 2.41 (H) 05/12/2021 0338   CREATININE 1.29 12/30/2014 1352   CALCIUM 8.9 05/12/2021 0338   GFRNONAA 26 (L) 05/12/2021 0338   GFRAA 21.85 03/31/2021 0000    INR    Component Value Date/Time   INR 1.4 (H) 05/08/2021 0923     Intake/Output Summary (Last 24 hours) at 05/12/2021 3953 Last data filed at 05/12/2021 2023 Gross per 24 hour  Intake 1138.37 ml  Output 2350 ml  Net -1211.63 ml     Assessment/plan:  87 y.o. male is s/p left lower extremity angiography for wound.  Unfortunately there is no reconstruction options he does have an anterior tibial artery on the left with brisk flow filling the arch no posterior tibial or peroneal  arteries.  He does have known popliteal artery aneurysms which were small at last evaluation.  He will follow-up in a few months in our office with repeat popliteal artery duplexes and ABIs   Jersey Espinoza C. Donzetta Matters, MD [facility] [place] [facility] Office: [phone] Pager: [phone]  05/12/2021 9:22 AM

## 2021-05-13 ENCOUNTER — Encounter (HOSPITAL_COMMUNITY): Payer: Self-pay | Admitting: Family Medicine

## 2021-05-13 DIAGNOSIS — I10 Essential (primary) hypertension: Secondary | ICD-10-CM | POA: Diagnosis not present

## 2021-05-13 DIAGNOSIS — I5043 Acute on chronic combined systolic (congestive) and diastolic (congestive) heart failure: Secondary | ICD-10-CM | POA: Diagnosis not present

## 2021-05-13 DIAGNOSIS — N184 Chronic kidney disease, stage 4 (severe): Secondary | ICD-10-CM | POA: Diagnosis not present

## 2021-05-13 DIAGNOSIS — I5023 Acute on chronic systolic (congestive) heart failure: Secondary | ICD-10-CM | POA: Diagnosis not present

## 2021-05-13 DIAGNOSIS — I2510 Atherosclerotic heart disease of native coronary artery without angina pectoris: Secondary | ICD-10-CM | POA: Diagnosis not present

## 2021-05-13 DIAGNOSIS — G4733 Obstructive sleep apnea (adult) (pediatric): Secondary | ICD-10-CM

## 2021-05-13 LAB — CBC
HCT: 34.9 % — ABNORMAL LOW (ref 39.0–52.0)
Hemoglobin: 10.5 g/dL — ABNORMAL LOW (ref 13.0–17.0)
MCH: 28.8 pg (ref 26.0–34.0)
MCHC: 30.1 g/dL (ref 30.0–36.0)
MCV: 95.6 fL (ref 80.0–100.0)
Platelets: 139 10*3/uL — ABNORMAL LOW (ref 150–400)
RBC: 3.65 MIL/uL — ABNORMAL LOW (ref 4.22–5.81)
RDW: 17.7 % — ABNORMAL HIGH (ref 11.5–15.5)
WBC: 7 10*3/uL (ref 4.0–10.5)
nRBC: 0 % (ref 0.0–0.2)

## 2021-05-13 LAB — GLUCOSE, CAPILLARY
Glucose-Capillary: 131 mg/dL — ABNORMAL HIGH (ref 70–99)
Glucose-Capillary: 154 mg/dL — ABNORMAL HIGH (ref 70–99)
Glucose-Capillary: 205 mg/dL — ABNORMAL HIGH (ref 70–99)
Glucose-Capillary: 162 mg/dL — ABNORMAL HIGH (ref 70–99)

## 2021-05-13 LAB — BASIC METABOLIC PANEL
Anion gap: 9 (ref 5–15)
BUN: 76 mg/dL — ABNORMAL HIGH (ref 8–23)
CO2: 32 mmol/L (ref 22–32)
Calcium: 8.8 mg/dL — ABNORMAL LOW (ref 8.9–10.3)
Chloride: 95 mmol/L — ABNORMAL LOW (ref 98–111)
Creatinine, Ser: 2.36 mg/dL — ABNORMAL HIGH (ref 0.61–1.24)
GFR, Estimated: 26 mL/min — ABNORMAL LOW (ref >60)
Glucose, Bld: 130 mg/dL — ABNORMAL HIGH (ref 70–99)
Potassium: 4.1 mmol/L (ref 3.5–5.1)
Sodium: 136 mmol/L (ref 135–145)

## 2021-05-13 MED ORDER — METOLAZONE 5 MG PO TABS
5.0000 mg | ORAL_TABLET | Freq: Once | ORAL | Status: AC
  Administered 2021-05-13: 5 mg via ORAL
  Filled 2021-05-13: qty 1

## 2021-05-13 NOTE — Progress Notes (Signed)
Physical Therapy Treatment Patient Details Name: Scott [name] MRN: [id number] DOB: 02-Aug-1933 Today's Date: 05/13/2021    History of Present Illness Pt is an 87 y.o. male admitted 05/04/2021 with weight gain, LE edema and SOB. Workup for acute on chronic heart failure exacerbation. Pt also with L foot ulceration; s/p LLE angiography, aortogram 8/1. PMH includes chronic systolic heart failure, CAD s/p CABG, HTN, CKD stage IV, HLD, GIB, anemia, gout, thrombocytopenia, PAD.   PT Comments    Pt progressing with mobility. Session focused on standing activity to improve pt's activity tolerance. Pt demonstrates improved stability this session; DOE 1-2/4 with ambulation; SpO2 93% on RA. Pt remains limited by generalized weakness, decreased activity tolerance and impaired balance strategies/postural reactions. Will continue to follow acutely to address established goals.   Follow Up Recommendations  Home health PT;Supervision - Intermittent     Equipment Recommendations  None recommended by PT    Recommendations for Other Services       Precautions / Restrictions Precautions Precautions: Fall Restrictions Weight Bearing Restrictions: No    Mobility  Bed Mobility Overal bed mobility: Modified Independent             General bed mobility comments: HOB elevated    Transfers Overall transfer level: Modified independent Equipment used: 4-wheeled walker Transfers: Sit to/from Stand Sit to Stand: Modified independent (Device/Increase time)         General transfer comment: Reliant on momentum to power into standing; mod indep with 1x verbal cues to lock rollator brakes, good carryover of this to subsequent trial  Ambulation/Gait Ambulation/Gait assistance: Supervision Gait Distance (Feet): 180 Feet Assistive device: 4-wheeled walker Gait Pattern/deviations: Step-through pattern;Decreased stride length;Trunk flexed;Shuffle Gait velocity: Decreased   General Gait Details: Very  slow, but mostly steady gait with rollator and supervision for safety; verbal cues for upright posture; pt reports baseline gait speed faster, but pt not increasing despite cues; endorses bilateral foot pain   Stairs             Wheelchair Mobility    Modified Rankin (Stroke Patients Only)       Balance Overall balance assessment: Needs assistance   Sitting balance-Leahy Scale: Good     Standing balance support: Bilateral upper extremity supported;Single extremity supported;During functional activity Standing balance-Leahy Scale: Fair Standing balance comment: Can static stand without UE support; static and dynamic stability improved wtih at least single UE support                            Cognition Arousal/Alertness: Awake/alert Behavior During Therapy: WFL for tasks assessed/performed;Flat affect Overall Cognitive Status: Within Functional Limits for tasks assessed                                 General Comments: WFL for simple tasks, not formally assessed      Exercises      General Comments General comments (skin integrity, edema, etc.): SpO2 93% on RA with ambulation. Session limited by pt receiving phone call upon return to room      Pertinent Vitals/Pain Pain Assessment: Faces Faces Pain Scale: Hurts a little bit Pain Location: Bilateral feet ("they always hurt") Pain Descriptors / Indicators: Discomfort Pain Intervention(s): Monitored during session    Home Living                      Prior Function  PT Goals (current goals can now be found in the care plan section) Progress towards PT goals: Progressing toward goals    Frequency    Min 3X/week      PT Plan Current plan remains appropriate    Co-evaluation              AM-PAC PT "6 Clicks" Mobility   Outcome Measure  Help needed turning from your back to your side while in a flat bed without using bedrails?: None Help needed  moving from lying on your back to sitting on the side of a flat bed without using bedrails?: None Help needed moving to and from a bed to a chair (including a wheelchair)?: None Help needed standing up from a chair using your arms (e.g., wheelchair or bedside chair)?: None Help needed to walk in hospital room?: A Little Help needed climbing 3-5 steps with a railing? : A Little 6 Click Score: 22    End of Session   Activity Tolerance: Patient tolerated treatment well;Patient limited by fatigue Patient left: in bed;with call bell/phone within reach Nurse Communication: Mobility status PT Visit Diagnosis: Other abnormalities of gait and mobility (R26.89)     Time: 2633-3545 PT Time Calculation (min) (ACUTE ONLY): 22 min  Charges:  $Therapeutic Exercise: 8-22 mins                     Mabeline Caras, PT, DPT Acute Rehabilitation Services  Pager [phone] Office [place] 05/13/2021, 1:33 PM

## 2021-05-13 NOTE — Plan of Care (Signed)
  Problem: Health Behavior/Discharge Planning: Goal: Ability to manage health-related needs will improve Outcome: Progressing   Problem: Clinical Measurements: Goal: Will remain free from infection Outcome: Progressing   

## 2021-05-13 NOTE — Progress Notes (Signed)
Progress Note  Patient Name: Scott [name] Date of Encounter: 05/13/2021  [place] Cardiologist: Peter Martinique, MD   Subjective   Breathing is unchanged from yesterday - appears mildly SOB. No complaints of chest pain or palpitations. No new issues overnight.   Inpatient Medications    Scheduled Meds:  ALPRAZolam  0.25 mg Oral QHS   aspirin  81 mg Oral QHS   atorvastatin  20 mg Oral Daily   cholecalciferol  2,000 Units Oral Q M,W,F-2000   colchicine  0.3 mg Oral Daily   febuxostat  40 mg Oral Daily   ferrous sulfate  325 mg Oral Q M,W,F   insulin aspart  0-9 Units Subcutaneous TID WC   latanoprost  1 drop Both Eyes QHS   metolazone  5 mg Oral Once   multivitamin with minerals  1 tablet Oral Daily   mupirocin ointment   Nasal BID   sodium chloride flush  3 mL Intravenous Q12H   tamsulosin  0.4 mg Oral QHS   Continuous Infusions:  sodium chloride     furosemide (LASIX) 200 mg in dextrose 5% 100 mL (2mg /mL) infusion 10 mg/hr (05/13/21 0600)   PRN Meds: sodium chloride, acetaminophen **OR** acetaminophen, docusate sodium, fluticasone, nitroGLYCERIN, oxyCODONE, sodium chloride flush   Vital Signs    Vitals:   05/12/21 1944 05/12/21 2329 05/12/21 2335 05/13/21 0332  BP: 119/74 122/71  122/71  Pulse: 68 72 68 66  Resp: 17 19 20 18   Temp: 97.8 F (36.6 C) 98.4 F (36.9 C)  (!) 97.5 F (36.4 C)  TempSrc: Oral   Oral  SpO2: 100% 98% 97% 100%  Weight:    96.2 kg  Height:        Intake/Output Summary (Last 24 hours) at 05/13/2021 1009 Last data filed at 05/13/2021 0900 Gross per 24 hour  Intake 1004.79 ml  Output 1850 ml  Net -845.21 ml   Last 3 Weights 05/13/2021 05/12/2021 05/11/2021  Weight (lbs) 212 lb 213 lb 11.2 oz 216 lb 14.4 oz  Weight (kg) 96.163 kg 96.934 kg 98.385 kg      Telemetry    Sinus rhythm with 1st degree AV block and frequent PACs - Personally Reviewed  ECG    No new tracing - Personally Reviewed  Physical Exam   GEN: Laying in bed  appearing mildly SOB but in no acute distress.   Neck: + JVD Cardiac: RRR, no murmurs, rubs, or gallops.  Respiratory: Clear to auscultation bilaterally. GI: Soft, nontender, non-distended  MS: 2+ LE edema; No deformity. Dressing on L foot is C/D/I Neuro:  Nonfocal  Psych: Normal affect   Labs    High Sensitivity Troponin:  No results for input(s): TROPONINIHS in the last 720 hours.    Chemistry Recent Labs  Lab 05/08/21 [phone] 05/09/21 0729 05/11/21 0239 05/12/21 0338 05/13/21 0228  NA 135   < > 135 136 136  K 4.2   < > 4.2 4.0 4.1  CL 99   < > 98 94* 95*  CO2 26   < > 27 31 32  GLUCOSE 155*   < > 132* 138* 130*  BUN 69*   < > 75* 76* 76*  CREATININE 2.25*   < > 2.29* 2.41* 2.36*  CALCIUM 8.4*   < > 8.8* 8.9 8.8*  PROT 6.2*  --   --   --   --   ALBUMIN 2.4*  --   --   --   --  AST 24  --   --   --   --   ALT 11  --   --   --   --   ALKPHOS 85  --   --   --   --   BILITOT 0.8  --   --   --   --   GFRNONAA 28*   < > 27* 26* 26*  ANIONGAP 10   < > 10 11 9    < > = values in this interval not displayed.     Hematology Recent Labs  Lab 05/10/21 0300 05/12/21 0338 05/13/21 0228  WBC 6.1 5.3 7.0  RBC 3.48* 3.28* 3.65*  HGB 9.9* 9.6* 10.5*  HCT 33.4* 31.5* 34.9*  MCV 96.0 96.0 95.6  MCH 28.4 29.3 28.8  MCHC 29.6* 30.5 30.1  RDW 17.5* 17.6* 17.7*  PLT 140* 122* 139*    BNPNo results for input(s): BNP, PROBNP in the last 168 hours.   DDimer No results for input(s): DDIMER in the last 168 hours.   Radiology    No results found.  Cardiac Studies    Echo 04/10/21: 1. Left ventricular ejection fraction, by estimation, is 25 to 30%. The  left ventricle has severely decreased function. The left ventricle  demonstrates global hypokinesis. The left ventricular internal cavity size  was moderately dilated. Left  ventricular diastolic parameters are consistent with Grade II diastolic  dysfunction (pseudonormalization). Elevated left ventricular end-diastolic   pressure.   2. Right ventricular systolic function is severely reduced. The right  ventricular size is severely enlarged. There is severely elevated  pulmonary artery systolic pressure.   3. Left atrial size was severely dilated.   4. Right atrial size was moderately dilated.   5. The mitral valve is degenerative. Mild mitral valve regurgitation.   6. Tricuspid valve regurgitation is mild to moderate.   7. The aortic valve is tricuspid. There is mild calcification of the  aortic valve. There is mild thickening of the aortic valve. Aortic valve  regurgitation is mild.   LE CO2 angiography 05/11/21: Findings: The aorta and iliac segments appear patent.  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  Bilateral renal arteries are patent.  Left common femoral artery does have some calcific disease as well as the SFA but there is no flow-limiting stenosis.  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  Below the knee we did evaluate with contrast there is a 50% stenosis of the anterior tibial artery at the takeoff but there is brisk flow all the way to the foot filling the arch only via the anterior tibial artery.  The peroneal and posterior tibial artery are not visualized.  No intervention was undertaken.                Patient Profile     87 y.o. male with CAD, chronic systolic and diastolic heart failure, pulmonary hypertension, hypertension, hyperlipidemia, diabetes, CKD 4, OSA on CPAP, and PAD admitted with acute on chronic systolic diastolic heart failure.  Assessment & Plan    1. Acute on chronic combined CHF: patient with known biventricular heart failure; EF 25-30%. Initially diuresed with IV lasix with minimal improvement, then transitioned to lasix gtt at 4mL/Hr 05/10/21. He continues to have just under 1L UOP over the past 24 hours, with net -4.0L this admission. Weight is down to 212lbs from 213lbs yesterday and 221lbs on admission. BUN/Cr  remain stable today at 76/2.36. He  appears mildly SOB today with ongoing 2+ LE edema - Will increase lasix gtt to 7.5 mL/hr today, in addition to already administered metolazone 5mg  this AM, and monitor for response  - Continue to hold Bblocker in the setting of acute decompensation.  - Continue to monitor strict I&Os and daily weights - Continue to monitor electrolytes and replete as needed to maintain K>4, Mg>6  2. CAD s/p CABG: no anginal complaints.  - Continue aspirin and statin.   3. HTN: BP stable. Home carvedilol on hold - Anticipate restarting carvedilol prior to discharge  4. HLD: LDL 20 this admission; goal <70 - Continue atorvastatin  5. PAD: He has a left foot ulcer. 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  There was calcified disease in the left femoral and SFA but no flow-limiting lesions.  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  Peroneal and posterior tibial arteries were not visualized.  He did not undergo any interventions.   - Continue aspirin and statin          For questions or updates, please contact [place] Please consult [email or web address] for contact info under        Signed, Abigail Butts, PA-C  05/13/2021, 10:09 AM

## 2021-05-13 NOTE — Progress Notes (Signed)
Progress Note    Scott [name]  JIR:[id number] DOB: 1932/11/01  DOA: 05/04/2021 PCP: Reubin Milan, MD      Brief Narrative:    Medical records reviewed and are as summarized below:  Scott [name] is a 87 y.o. male with medical history significant for chronic systolic heart failure, CAD status post CABG stent, hypertension, CKD stage IV, HLD, GI bleed, chronic iron deficiency anemia, gout, thrombocytopenia, PAD, who presented to the hospital because of weight gain, lower extremity swelling and increasing shortness of breath.      Assessment/Plan:   Principal Problem:   Acute exacerbation of CHF (congestive heart failure) (HCC) Active Problems:   Hypertension   Dyslipidemia   Arteriosclerotic coronary artery disease   Diabetes mellitus, type II, insulin dependent (HCC)   OSA (obstructive sleep apnea)   PAD (peripheral artery disease) (HCC)   CKD (chronic kidney disease) stage 4, GFR 15-29 ml/min (HCC)   Iron deficiency anemia   Thrombocytopenia (HCC)   Prolonged QT interval    Body mass index is 28.75 kg/m.    Acute on chronic systolic and diastolic CHF: Continue IV Lasix infusion.  He was given 5 mg of metolazone today.  Monitor BMP, daily weight and urine output.  Follow-up with cardiologist.  Left foot ulcer: Appreciate recommendations from wound care nurse.  ABI showed moderate right lower extremity arterial disease but no significant left lower extremity arterial disease.  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  CKD stage IV: Creatinine is stable.  Possible liver cirrhosis by ultrasound.  Outpatient follow-up with gastroenterologist.  Other comorbidities include hypertension, dyslipidemia, type II DM, CAD, Gout, OSA    Diet Order             Diet Heart Room service appropriate? Yes; Fluid consistency: Thin; Fluid restriction: 1200 mL Fluid  Diet effective now                       Consultants: Cardiologist  Procedures: Lower extremity extremity CO2 angiography on 05/11/2021    Medications:    ALPRAZolam  0.25 mg Oral QHS   aspirin  81 mg Oral QHS   atorvastatin  20 mg Oral Daily   cholecalciferol  2,000 Units Oral Q M,W,F-2000   colchicine  0.3 mg Oral Daily   febuxostat  40 mg Oral Daily   ferrous sulfate  325 mg Oral Q M,W,F   insulin aspart  0-9 Units Subcutaneous TID WC   latanoprost  1 drop Both Eyes QHS   multivitamin with minerals  1 tablet Oral Daily   mupirocin ointment   Nasal BID   sodium chloride flush  3 mL Intravenous Q12H   tamsulosin  0.4 mg Oral QHS   Continuous Infusions:  sodium chloride     furosemide (LASIX) 200 mg in dextrose 5% 100 mL (2mg /mL) infusion 15 mg/hr (05/13/21 1202)     Anti-infectives (From admission, onward)    None              Family Communication/Anticipated D/C date and plan/Code Status   DVT prophylaxis: SCDs Start: 05/05/21 1256     Code Status: DNR  Family Communication: None Disposition Plan:    Status is: Inpatient  Remains inpatient appropriate because:IV treatments appropriate due to intensity of illness or inability to take PO  Dispo: The patient is from: Home  Anticipated d/c is to: Home              Patient currently is not medically stable to d/c.   Difficult to place patient No           Subjective:   C/o shortness of breath with exertion.  He still has swelling in his legs.  Objective:    Vitals:   05/12/21 2329 05/12/21 2335 05/13/21 0332 05/13/21 1112  BP: 122/71  122/71 116/69  Pulse: 72 68 66 64  Resp: 19 20 18 16   Temp: 98.4 F (36.9 C)  (!) 97.5 F (36.4 C) 98.3 F (36.8 C)  TempSrc:   Oral   SpO2: 98% 97% 100% 92%  Weight:   96.2 kg   Height:       No data found.   Intake/Output Summary (Last 24 hours) at 05/13/2021 1343 Last data filed at 05/13/2021 1202 Gross per 24 hour  Intake 1004.79 ml  Output 1750 ml  Net  -745.21 ml   Filed Weights   05/11/21 0500 05/12/21 0459 05/13/21 0332  Weight: 98.4 kg 96.9 kg 96.2 kg    Exam:  GEN: NAD SKIN: Warm and dry EYES: EOMI ENT: MMM CV: RRR PULM: CTA B ABD: soft, distended, abdominal hernia, NT, +BS CNS: AAO x 3, non focal EXT: Bilateral lower extremity edema from the thighs to the feet, no tenderness        Data Reviewed:   I have personally reviewed following labs and imaging studies:  Labs: Labs show the following:   Basic Metabolic Panel: Recent Labs  Lab 05/09/21 0729 05/10/21 0300 05/11/21 0239 05/12/21 0338 05/13/21 0228  NA 137 136 135 136 136  K 4.1 4.3 4.2 4.0 4.1  CL 99 96* 98 94* 95*  CO2 29 29 27 31  32  GLUCOSE 97 126* 132* 138* 130*  BUN 69* 72* 75* 76* 76*  CREATININE 2.29* 2.24* 2.29* 2.41* 2.36*  CALCIUM 8.8* 8.9 8.8* 8.9 8.8*   GFR Estimated Creatinine Clearance: 27 mL/min (A) (by C-G formula based on SCr of 2.36 mg/dL (H)). Liver Function Tests: Recent Labs  Lab 05/08/21 0923  AST 24  ALT 11  ALKPHOS 85  BILITOT 0.8  PROT 6.2*  ALBUMIN 2.4*   No results for input(s): LIPASE, AMYLASE in the last 168 hours. No results for input(s): AMMONIA in the last 168 hours. Coagulation profile Recent Labs  Lab 05/08/21 0923  INR 1.4*    CBC: Recent Labs  Lab 05/08/21 0923 05/09/21 0729 05/10/21 0300 05/12/21 0338 05/13/21 0228  WBC 5.0 5.2 6.1 5.3 7.0  HGB 9.1* 9.7* 9.9* 9.6* 10.5*  HCT 30.6* 32.9* 33.4* 31.5* 34.9*  MCV 97.1 97.1 96.0 96.0 95.6  PLT 120* 130* 140* 122* 139*   Cardiac Enzymes: No results for input(s): CKTOTAL, CKMB, CKMBINDEX, TROPONINI in the last 168 hours. BNP (last 3 results) No results for input(s): PROBNP in the last 8760 hours. CBG: Recent Labs  Lab 05/12/21 1106 05/12/21 1613 05/12/21 2047 05/13/21 0554 05/13/21 1112  GLUCAP 170* 160* 184* 131* 154*   D-Dimer: No results for input(s): DDIMER in the last 72 hours. Hgb A1c: No results for input(s): HGBA1C in  the last 72 hours. Lipid Profile: Recent Labs    05/12/21 0338  CHOL 57  HDL 29*  LDLCALC 20  TRIG 39  CHOLHDL 2.0   Thyroid function studies: No results for input(s): TSH, T4TOTAL, T3FREE, THYROIDAB in the last 72 hours.  Invalid input(s): FREET3 Anemia work  up: No results for input(s): VITAMINB12, FOLATE, FERRITIN, TIBC, IRON, RETICCTPCT in the last 72 hours. Sepsis Labs: Recent Labs  Lab 05/09/21 0729 05/10/21 0300 05/12/21 0338 05/13/21 0228  WBC 5.2 6.1 5.3 7.0    Microbiology Recent Results (from the past 240 hour(s))  Resp Panel by RT-PCR (Flu A&B, Covid) Nasopharyngeal Swab     Status: None   Collection Time: 05/05/21 11:34 AM   Specimen: Nasopharyngeal Swab; Nasopharyngeal(NP) swabs in vial transport medium  Result Value Ref Range Status   SARS Coronavirus 2 by RT PCR NEGATIVE NEGATIVE Final    Comment: (NOTE) SARS-CoV-2 target nucleic acids are NOT DETECTED.  The SARS-CoV-2 RNA is generally detectable in upper respiratory specimens during the acute phase of infection. The lowest concentration of SARS-CoV-2 viral copies this assay can detect is 138 copies/mL. A negative result does not preclude SARS-Cov-2 infection and should not be used as the sole basis for treatment or other patient management decisions. A negative result may occur with  improper specimen collection/handling, submission of specimen other than nasopharyngeal swab, presence of viral mutation(s) within the areas targeted by this assay, and inadequate number of viral copies(<138 copies/mL). A negative result must be combined with clinical observations, patient history, and epidemiological information. The expected result is Negative.  Fact Sheet for Patients:  EntrepreneurPulse.com.au  Fact Sheet for Healthcare Providers:  IncredibleEmployment.be  This test is no t yet approved or cleared by the [place] FDA and  has been authorized for detection  and/or diagnosis of SARS-CoV-2 by FDA under an Emergency Use Authorization (EUA). This EUA will remain  in effect (meaning this test can be used) for the duration of the COVID-19 declaration under Section 564(b)(1) of the Act, 21 U.S.C.section 360bbb-3(b)(1), unless the authorization is terminated  or revoked sooner.       Influenza A by PCR NEGATIVE NEGATIVE Final   Influenza B by PCR NEGATIVE NEGATIVE Final    Comment: (NOTE) The Xpert Xpress SARS-CoV-2/FLU/RSV plus assay is intended as an aid in the diagnosis of influenza from Nasopharyngeal swab specimens and should not be used as a sole basis for treatment. Nasal washings and aspirates are unacceptable for Xpert Xpress SARS-CoV-2/FLU/RSV testing.  Fact Sheet for Patients: EntrepreneurPulse.com.au  Fact Sheet for Healthcare Providers: IncredibleEmployment.be  This test is not yet approved or cleared by the [place] FDA and has been authorized for detection and/or diagnosis of SARS-CoV-2 by FDA under an Emergency Use Authorization (EUA). This EUA will remain in effect (meaning this test can be used) for the duration of the COVID-19 declaration under Section 564(b)(1) of the Act, 21 U.S.C. section 360bbb-3(b)(1), unless the authorization is terminated or revoked.  Performed at [facility], [place] [address], [place], [place] [address]     Procedures and diagnostic studies:  No results found.             LOS: 8 days   Anelisse Jacobson  [facility]   Pager on [email or web address]. If 7PM-7AM, please contact night-coverage at [email or web address]     05/13/2021, 1:43 PM

## 2021-05-13 NOTE — Progress Notes (Signed)
CCMD notified on RN that the patient had a 14 beat run of [place]. RN assessed the patient, patient was sleeping at the time. Patient is alert and oriented x 4, expresses no chest pain and is asymptomatic. Paged cardiology to notify. Will continue to monitor patient.

## 2021-05-14 DIAGNOSIS — I5043 Acute on chronic combined systolic (congestive) and diastolic (congestive) heart failure: Secondary | ICD-10-CM | POA: Diagnosis not present

## 2021-05-14 DIAGNOSIS — I2510 Atherosclerotic heart disease of native coronary artery without angina pectoris: Secondary | ICD-10-CM | POA: Diagnosis not present

## 2021-05-14 DIAGNOSIS — I10 Essential (primary) hypertension: Secondary | ICD-10-CM | POA: Diagnosis not present

## 2021-05-14 DIAGNOSIS — N1832 Chronic kidney disease, stage 3b: Secondary | ICD-10-CM

## 2021-05-14 LAB — GLUCOSE, CAPILLARY
Glucose-Capillary: 127 mg/dL — ABNORMAL HIGH (ref 70–99)
Glucose-Capillary: 166 mg/dL — ABNORMAL HIGH (ref 70–99)
Glucose-Capillary: 198 mg/dL — ABNORMAL HIGH (ref 70–99)
Glucose-Capillary: 231 mg/dL — ABNORMAL HIGH (ref 70–99)
Glucose-Capillary: 169 mg/dL — ABNORMAL HIGH (ref 70–99)

## 2021-05-14 LAB — BASIC METABOLIC PANEL
Anion gap: 10 (ref 5–15)
BUN: 79 mg/dL — ABNORMAL HIGH (ref 8–23)
CO2: 32 mmol/L (ref 22–32)
Calcium: 8.9 mg/dL (ref 8.9–10.3)
Chloride: 92 mmol/L — ABNORMAL LOW (ref 98–111)
Creatinine, Ser: 2.41 mg/dL — ABNORMAL HIGH (ref 0.61–1.24)
GFR, Estimated: 26 mL/min — ABNORMAL LOW (ref >60)
Glucose, Bld: 138 mg/dL — ABNORMAL HIGH (ref 70–99)
Potassium: 3.9 mmol/L (ref 3.5–5.1)
Sodium: 134 mmol/L — ABNORMAL LOW (ref 135–145)

## 2021-05-14 MED ORDER — INSULIN ASPART 100 UNIT/ML IJ SOLN
0.0000 [IU] | Freq: Three times a day (TID) | INTRAMUSCULAR | Status: DC
  Administered 2021-05-15 (×2): 2 [IU] via SUBCUTANEOUS
  Administered 2021-05-16: 1 [IU] via SUBCUTANEOUS
  Administered 2021-05-16: 2 [IU] via SUBCUTANEOUS
  Administered 2021-05-17: 1 [IU] via SUBCUTANEOUS
  Administered 2021-05-17: 2 [IU] via SUBCUTANEOUS
  Administered 2021-05-17: 3 [IU] via SUBCUTANEOUS
  Administered 2021-05-18: 1 [IU] via SUBCUTANEOUS
  Administered 2021-05-18: 2 [IU] via SUBCUTANEOUS
  Administered 2021-05-18: 3 [IU] via SUBCUTANEOUS
  Administered 2021-05-19 (×2): 2 [IU] via SUBCUTANEOUS
  Administered 2021-05-19: 1 [IU] via SUBCUTANEOUS
  Administered 2021-05-20: 2 [IU] via SUBCUTANEOUS
  Administered 2021-05-20: 1 [IU] via SUBCUTANEOUS

## 2021-05-14 NOTE — Progress Notes (Signed)
Mobility Specialist Criteria Algorithm Info.  Mobility Team: [facility] elevated:Self regulated Activity: Ambulated in room; Dangled on edge of bed Range of motion: Active; All extremities Level of assistance: Independent Assistive device: Front wheel walker Minutes sitting in chair:  Minutes stood: 3 minutes Minutes ambulated: 3 minutes Distance ambulated (ft): 25 ft Mobility response: Tolerated well Bed Position: Semi-fowlers   05/14/2021 11:29 AM

## 2021-05-14 NOTE — Progress Notes (Signed)
Progress Note    Scott [name]  HCW:[id number] DOB: [birth year]  DOA: 05/04/2021 PCP: Reubin Milan, MD      Brief Narrative:    Medical records reviewed and are as summarized below:  Scott [name] is a 87 y.o. male with medical history significant for chronic systolic and diastolic heart failure, CAD status post CABG stent, hypertension, CKD stage IV, HLD, GI bleed, chronic iron deficiency anemia, gout, thrombocytopenia, PAD, who presented to the hospital because of weight gain, lower extremity swelling and increasing shortness of breath.      Assessment/Plan:   Principal Problem:   Acute exacerbation of CHF (congestive heart failure) (HCC) Active Problems:   Hypertension   Dyslipidemia   Arteriosclerotic coronary artery disease   Diabetes mellitus, type II, insulin dependent (HCC)   OSA (obstructive sleep apnea)   PAD (peripheral artery disease) (HCC)   CKD (chronic kidney disease) stage 4, GFR 15-29 ml/min (HCC)   Iron deficiency anemia   Thrombocytopenia (HCC)   Prolonged QT interval    Body mass index is 28.26 kg/m.    Acute on chronic systolic and diastolic CHF, anasarca: Continue IV Lasix infusion.  Monitor BMP, daily weight and urine output. Follow-up with cardiologist.  2D echo showed EF estimated at 25 to 17%, grade 2 diastolic dysfunction.  Left foot ulcer: Appreciate recommendations from wound care nurse.  ABI showed moderate right lower extremity arterial disease but no significant left lower extremity arterial disease.  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  CKD stage IV: Creatinine is stable.  Possible liver cirrhosis by ultrasound.  Outpatient follow-up with gastroenterologist.  Other comorbidities include hypertension, dyslipidemia, type II DM, CAD, Gout, OSA    Diet Order             Diet Heart Room service appropriate? Yes; Fluid consistency: Thin; Fluid restriction: 1200 mL  Fluid  Diet effective now                      Consultants: Cardiologist  Procedures: Lower extremity extremity CO2 angiography on 05/11/2021    Medications:    ALPRAZolam  0.25 mg Oral QHS   aspirin  81 mg Oral QHS   atorvastatin  20 mg Oral Daily   cholecalciferol  2,000 Units Oral Q M,W,F-2000   colchicine  0.3 mg Oral Daily   febuxostat  40 mg Oral Daily   ferrous sulfate  325 mg Oral Q M,W,F   insulin aspart  0-9 Units Subcutaneous TID WC   latanoprost  1 drop Both Eyes QHS   multivitamin with minerals  1 tablet Oral Daily   mupirocin ointment   Nasal BID   sodium chloride flush  3 mL Intravenous Q12H   tamsulosin  0.4 mg Oral QHS   Continuous Infusions:  sodium chloride     furosemide (LASIX) 200 mg in dextrose 5% 100 mL (2mg /mL) infusion 15 mg/hr (05/14/21 1307)     Anti-infectives (From admission, onward)    None              Family Communication/Anticipated D/C date and plan/Code Status   DVT prophylaxis: SCDs Start: 05/05/21 1256     Code Status: DNR  Family Communication: None Disposition Plan:    Status is: Inpatient  Remains inpatient appropriate because:IV treatments appropriate due to intensity of illness or inability to take PO  Dispo: The patient is from: Home  Anticipated d/c is to: Home              Patient currently is not medically stable to d/c.   Difficult to place patient No           Subjective:   C/o shortness of breath with exertion.  He still has swelling in his legs.  Objective:    Vitals:   05/13/21 1955 05/14/21 0453 05/14/21 0852 05/14/21 1123  BP: 138/88 123/64 113/72 124/68  Pulse: 67 69 74 67  Resp: (!) 21 20  18   Temp: (!) 97.4 F (36.3 C) (!) 97.3 F (36.3 C)  97.7 F (36.5 C)  TempSrc: Oral Oral  Oral  SpO2: 94% 98% 100% 100%  Weight:  94.5 kg    Height:       No data found.   Intake/Output Summary (Last 24 hours) at 05/14/2021 1501 Last data filed at 05/14/2021  1348 Gross per 24 hour  Intake 720 ml  Output 2675 ml  Net -1955 ml   Filed Weights   05/12/21 0459 05/13/21 0332 05/14/21 0453  Weight: 96.9 kg 96.2 kg 94.5 kg    Exam:  GEN: NAD SKIN: Warm and dry EYES: No pallor or icterus ENT: MMM CV: RRR PULM: Bibasilar rales.  No wheezes. ABD: soft, distended, abdominal hernia, NT, +BS CNS: AAO x 3, non focal EXT: B/l lower extremity edema from thighs to feet.  No tenderness          Data Reviewed:   I have personally reviewed following labs and imaging studies:  Labs: Labs show the following:   Basic Metabolic Panel: Recent Labs  Lab 05/10/21 0300 05/11/21 0239 05/12/21 0338 05/13/21 0228 05/14/21 0139  NA 136 135 136 136 134*  K 4.3 4.2 4.0 4.1 3.9  CL 96* 98 94* 95* 92*  CO2 29 27 31  32 32  GLUCOSE 126* 132* 138* 130* 138*  BUN 72* 75* 76* 76* 79*  CREATININE 2.24* 2.29* 2.41* 2.36* 2.41*  CALCIUM 8.9 8.8* 8.9 8.8* 8.9   GFR Estimated Creatinine Clearance: 26.3 mL/min (A) (by C-G formula based on SCr of 2.41 mg/dL (H)). Liver Function Tests: Recent Labs  Lab 05/08/21 0923  AST 24  ALT 11  ALKPHOS 85  BILITOT 0.8  PROT 6.2*  ALBUMIN 2.4*   No results for input(s): LIPASE, AMYLASE in the last 168 hours. No results for input(s): AMMONIA in the last 168 hours. Coagulation profile Recent Labs  Lab 05/08/21 0923  INR 1.4*    CBC: Recent Labs  Lab 05/08/21 0923 05/09/21 0729 05/10/21 0300 05/12/21 0338 05/13/21 0228  WBC 5.0 5.2 6.1 5.3 7.0  HGB 9.1* 9.7* 9.9* 9.6* 10.5*  HCT 30.6* 32.9* 33.4* 31.5* 34.9*  MCV 97.1 97.1 96.0 96.0 95.6  PLT 120* 130* 140* 122* 139*   Cardiac Enzymes: No results for input(s): CKTOTAL, CKMB, CKMBINDEX, TROPONINI in the last 168 hours. BNP (last 3 results) No results for input(s): PROBNP in the last 8760 hours. CBG: Recent Labs  Lab 05/13/21 1112 05/13/21 1556 05/13/21 2051 05/14/21 0604 05/14/21 1120  GLUCAP 154* 205* 162* 127* 166*   D-Dimer: No  results for input(s): DDIMER in the last 72 hours. Hgb A1c: No results for input(s): HGBA1C in the last 72 hours. Lipid Profile: Recent Labs    05/12/21 0338  CHOL 57  HDL 29*  LDLCALC 20  TRIG 39  CHOLHDL 2.0   Thyroid function studies: No results for input(s): TSH, T4TOTAL, T3FREE, THYROIDAB in the  last 72 hours.  Invalid input(s): FREET3 Anemia work up: No results for input(s): VITAMINB12, FOLATE, FERRITIN, TIBC, IRON, RETICCTPCT in the last 72 hours. Sepsis Labs: Recent Labs  Lab 05/09/21 0729 05/10/21 0300 05/12/21 0338 05/13/21 0228  WBC 5.2 6.1 5.3 7.0    Microbiology Recent Results (from the past 240 hour(s))  Resp Panel by RT-PCR (Flu A&B, Covid) Nasopharyngeal Swab     Status: None   Collection Time: 05/05/21 11:34 AM   Specimen: Nasopharyngeal Swab; Nasopharyngeal(NP) swabs in vial transport medium  Result Value Ref Range Status   SARS Coronavirus 2 by RT PCR NEGATIVE NEGATIVE Final    Comment: (NOTE) SARS-CoV-2 target nucleic acids are NOT DETECTED.  The SARS-CoV-2 RNA is generally detectable in upper respiratory specimens during the acute phase of infection. The lowest concentration of SARS-CoV-2 viral copies this assay can detect is 138 copies/mL. A negative result does not preclude SARS-Cov-2 infection and should not be used as the sole basis for treatment or other patient management decisions. A negative result may occur with  improper specimen collection/handling, submission of specimen other than nasopharyngeal swab, presence of viral mutation(s) within the areas targeted by this assay, and inadequate number of viral copies(<138 copies/mL). A negative result must be combined with clinical observations, patient history, and epidemiological information. The expected result is Negative.  Fact Sheet for Patients:  EntrepreneurPulse.com.au  Fact Sheet for Healthcare Providers:  IncredibleEmployment.be  This test  is no t yet approved or cleared by the [place] FDA and  has been authorized for detection and/or diagnosis of SARS-CoV-2 by FDA under an Emergency Use Authorization (EUA). This EUA will remain  in effect (meaning this test can be used) for the duration of the COVID-19 declaration under Section 564(b)(1) of the Act, 21 U.S.C.section 360bbb-3(b)(1), unless the authorization is terminated  or revoked sooner.       Influenza A by PCR NEGATIVE NEGATIVE Final   Influenza B by PCR NEGATIVE NEGATIVE Final    Comment: (NOTE) The Xpert Xpress SARS-CoV-2/FLU/RSV plus assay is intended as an aid in the diagnosis of influenza from Nasopharyngeal swab specimens and should not be used as a sole basis for treatment. Nasal washings and aspirates are unacceptable for Xpert Xpress SARS-CoV-2/FLU/RSV testing.  Fact Sheet for Patients: EntrepreneurPulse.com.au  Fact Sheet for Healthcare Providers: IncredibleEmployment.be  This test is not yet approved or cleared by the [place] FDA and has been authorized for detection and/or diagnosis of SARS-CoV-2 by FDA under an Emergency Use Authorization (EUA). This EUA will remain in effect (meaning this test can be used) for the duration of the COVID-19 declaration under Section 564(b)(1) of the Act, 21 U.S.C. section 360bbb-3(b)(1), unless the authorization is terminated or revoked.  Performed at [facility], [place] [address] [place], [place] [address]     Procedures and diagnostic studies:  No results found.             LOS: 9 days   Calla Wedekind  [employer], advertising on [email or web address]. If 7PM-7AM, please contact night-coverage at [email or web address]     05/14/2021, 3:01 PM

## 2021-05-14 NOTE — Progress Notes (Signed)
Progress Note  Patient Name: Scott [name] Date of Encounter: 05/14/2021  [place] Cardiologist: Peter Martinique, MD   Subjective   Breathing is slowly improving.  He still does not feel that he is at his baseline.    Inpatient Medications    Scheduled Meds:  ALPRAZolam  0.25 mg Oral QHS   aspirin  81 mg Oral QHS   atorvastatin  20 mg Oral Daily   cholecalciferol  2,000 Units Oral Q M,W,F-2000   colchicine  0.3 mg Oral Daily   febuxostat  40 mg Oral Daily   ferrous sulfate  325 mg Oral Q M,W,F   insulin aspart  0-9 Units Subcutaneous TID WC   latanoprost  1 drop Both Eyes QHS   multivitamin with minerals  1 tablet Oral Daily   mupirocin ointment   Nasal BID   sodium chloride flush  3 mL Intravenous Q12H   tamsulosin  0.4 mg Oral QHS   Continuous Infusions:  sodium chloride     furosemide (LASIX) 200 mg in dextrose 5% 100 mL (2mg /mL) infusion 15 mg/hr (05/14/21 0013)   PRN Meds: sodium chloride, acetaminophen **OR** acetaminophen, docusate sodium, fluticasone, nitroGLYCERIN, oxyCODONE, sodium chloride flush   Vital Signs    Vitals:   05/13/21 1112 05/13/21 1955 05/14/21 0453 05/14/21 0852  BP: 116/69 138/88 123/64 113/72  Pulse: 64 67 69 74  Resp: 16 (!) 21 20   Temp: 98.3 F (36.8 C) (!) 97.4 F (36.3 C) (!) 97.3 F (36.3 C)   TempSrc:  Oral Oral   SpO2: 92% 94% 98% 100%  Weight:   94.5 kg   Height:        Intake/Output Summary (Last 24 hours) at 05/14/2021 1008 Last data filed at 05/14/2021 0807 Gross per 24 hour  Intake 776.3 ml  Output 2625 ml  Net -1848.7 ml   Last 3 Weights 05/14/2021 05/13/2021 05/12/2021  Weight (lbs) 208 lb 6.4 oz 212 lb 213 lb 11.2 oz  Weight (kg) 94.53 kg 96.163 kg 96.934 kg      Telemetry    Sinus rhythm.  PVCs.  14 beat NSVT- Personally Reviewed  ECG    N/a - Personally Reviewed  Physical Exam   VS:  BP 113/72 (BP Location: Right Arm)   Pulse 74   Temp (!) 97.3 F (36.3 C) (Oral)   Resp 20   Ht 6' (1.829 m)   Wt  94.5 kg Comment: scale c  SpO2 100%   BMI 28.26 kg/m  , BMI Body mass index is 28.26 kg/m. GENERAL: Chronically ill-appearing HEENT: Pupils equal round and reactive, fundi not visualized, oral mucosa unremarkable NECK: + jugular venous distention.  JVP 12 mmHg waveform within normal limits, carotid upstroke brisk and symmetric, no bruits LUNGS:  Clear to auscultation bilaterally HEART:  mostly regular with occasional ectopy.  PMI not displaced or sustained,S1 and S2 within normal limits, no S3, no S4, no clicks, no rubs, no murmurs ABD:  Flat, positive bowel sounds normal in frequency in pitch, no bruits, no rebound, no guarding, no midline pulsatile mass, no hepatomegaly, no splenomegaly EXT: Legs wrapped.  2+ lower extremity edema SKIN:  No rashes no nodules NEURO:  Cranial nerves II through XII grossly intact, motor grossly intact throughout PSYCH:  Cognitively intact, oriented to person place and time   Labs    High Sensitivity Troponin:   No results for input(s): TROPONINIHS in the last 720 hours.     Chemistry Recent Labs  Lab  05/08/21 8101 05/09/21 0729 05/12/21 0338 05/13/21 0228 05/14/21 0139  NA 135   < > 136 136 134*  K 4.2   < > 4.0 4.1 3.9  CL 99   < > 94* 95* 92*  CO2 26   < > 31 32 32  GLUCOSE 155*   < > 138* 130* 138*  BUN 69*   < > 76* 76* 79*  CREATININE 2.25*   < > 2.41* 2.36* 2.41*  CALCIUM 8.4*   < > 8.9 8.8* 8.9  PROT 6.2*  --   --   --   --   ALBUMIN 2.4*  --   --   --   --   AST 24  --   --   --   --   ALT 11  --   --   --   --   ALKPHOS 85  --   --   --   --   BILITOT 0.8  --   --   --   --   GFRNONAA 28*   < > 26* 26* 26*  ANIONGAP 10   < > 11 9 10    < > = values in this interval not displayed.     Hematology Recent Labs  Lab 05/10/21 0300 05/12/21 0338 05/13/21 0228  WBC 6.1 5.3 7.0  RBC 3.48* 3.28* 3.65*  HGB 9.9* 9.6* 10.5*  HCT 33.4* 31.5* 34.9*  MCV 96.0 96.0 95.6  MCH 28.4 29.3 28.8  MCHC 29.6* 30.5 30.1  RDW 17.5* 17.6*  17.7*  PLT 140* 122* 139*    BNP No results for input(s): BNP, PROBNP in the last 168 hours.    DDimer No results for input(s): DDIMER in the last 168 hours.   Radiology    No results found.  Cardiac Studies   Echo 04/10/21: 1. Left ventricular ejection fraction, by estimation, is 25 to 30%. The  left ventricle has severely decreased function. The left ventricle  demonstrates global hypokinesis. The left ventricular internal cavity size  was moderately dilated. Left  ventricular diastolic parameters are consistent with Grade II diastolic  dysfunction (pseudonormalization). Elevated left ventricular end-diastolic  pressure.   2. Right ventricular systolic function is severely reduced. The right  ventricular size is severely enlarged. There is severely elevated  pulmonary artery systolic pressure.   3. Left atrial size was severely dilated.   4. Right atrial size was moderately dilated.   5. The mitral valve is degenerative. Mild mitral valve regurgitation.   6. Tricuspid valve regurgitation is mild to moderate.   7. The aortic valve is tricuspid. There is mild calcification of the  aortic valve. There is mild thickening of the aortic valve. Aortic valve  regurgitation is mild.   LE CO2 angiography 05/11/21: Findings: The aorta and iliac segments appear patent.  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  Bilateral renal arteries are patent.  Left common femoral artery does have some calcific disease as well as the SFA but there is no flow-limiting stenosis.  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  Below the knee we did evaluate with contrast there is a 50% stenosis of the anterior tibial artery at the takeoff but there is brisk flow all the way to the foot filling the arch only via the anterior tibial artery.  The peroneal and posterior tibial artery are not visualized.  No intervention was undertaken.  Patient  Profile     87 y.o. male with CAD, chronic systolic and diastolic heart failure, pulmonary hypertension, hypertension, hyperlipidemia, diabetes, CKD 4, OSA on CPAP, and PAD admitted with acute on chronic systolic diastolic heart failure.  Assessment & Plan    # Acute on chronic systolic ingestive heart failure: # CKD IV: Biventricular failure.  LV EF 25 to 30%.  His home torsemide was increased and then he was converted to IV Lasix.  He was started on a lasix drip, which was increased to 15 mL/h yesterday.  Today he is net -1.7 L.  Renal function remains elevated but relatively stable.  Continue with diuresis as he is still volume overloaded.   # CAD status post CABG: # Hyperlipidemia: LIMA to LAD, SVG to PLA, and SVG to PDA.  He currently has no anginal symptoms.  Continue aspirin and atorvastatin.  # Essential hypertension: Losartan has been held due to worsening renal dysfunction.  Blood pressure is well have been controlled.  # PAD: He has a left foot ulcer.  He went for CO2 angiography and was found to have an ectatic aorta and common iliac arteries.  There was calcified disease in the left femoral and SFA but no flow-limiting lesions.  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  Peroneal and posterior tibial arteries were not visualized.  He did not undergo any interventions.  Continue aspirin and statin.  Will add fasting lipids.  LDL goal <70.  For questions or updates, please contact [place] Please consult [email or web address] for contact info under        Signed, Skeet Latch, MD  05/14/2021, 10:08 AM

## 2021-05-14 NOTE — Progress Notes (Signed)
Patient placed self on CPAP.  RT assistance not needed at this time.  RT available if needed.  Patient resting well.

## 2021-05-15 DIAGNOSIS — I10 Essential (primary) hypertension: Secondary | ICD-10-CM | POA: Diagnosis not present

## 2021-05-15 DIAGNOSIS — R188 Other ascites: Secondary | ICD-10-CM

## 2021-05-15 DIAGNOSIS — N184 Chronic kidney disease, stage 4 (severe): Secondary | ICD-10-CM | POA: Diagnosis not present

## 2021-05-15 DIAGNOSIS — I5043 Acute on chronic combined systolic (congestive) and diastolic (congestive) heart failure: Secondary | ICD-10-CM | POA: Diagnosis not present

## 2021-05-15 LAB — GLUCOSE, CAPILLARY
Glucose-Capillary: 118 mg/dL — ABNORMAL HIGH (ref 70–99)
Glucose-Capillary: 156 mg/dL — ABNORMAL HIGH (ref 70–99)
Glucose-Capillary: 160 mg/dL — ABNORMAL HIGH (ref 70–99)
Glucose-Capillary: 147 mg/dL — ABNORMAL HIGH (ref 70–99)

## 2021-05-15 LAB — BASIC METABOLIC PANEL
Anion gap: 8 (ref 5–15)
BUN: 76 mg/dL — ABNORMAL HIGH (ref 8–23)
CO2: 36 mmol/L — ABNORMAL HIGH (ref 22–32)
Calcium: 8.9 mg/dL (ref 8.9–10.3)
Chloride: 92 mmol/L — ABNORMAL LOW (ref 98–111)
Creatinine, Ser: 2.34 mg/dL — ABNORMAL HIGH (ref 0.61–1.24)
GFR, Estimated: 26 mL/min — ABNORMAL LOW (ref >60)
Glucose, Bld: 127 mg/dL — ABNORMAL HIGH (ref 70–99)
Potassium: 3.8 mmol/L (ref 3.5–5.1)
Sodium: 136 mmol/L (ref 135–145)

## 2021-05-15 LAB — MAGNESIUM: Magnesium: 2 mg/dL (ref 1.7–2.4)

## 2021-05-15 MED ORDER — METOLAZONE 5 MG PO TABS
10.0000 mg | ORAL_TABLET | Freq: Once | ORAL | Status: AC
  Administered 2021-05-15: 10 mg via ORAL
  Filled 2021-05-15: qty 2

## 2021-05-15 NOTE — Progress Notes (Signed)
Central monitor called patient had 12 beats of VT, pt asymptomatic. MD notified. No new order given will continue to monitor the patient

## 2021-05-15 NOTE — Progress Notes (Signed)
   05/15/21 0950  Mobility  Activity Ambulated in room;Refused mobility (Declined hallway ambulation)  Range of Motion/Exercises Active;All extremities  Level of Assistance Standby assist, set-up cues, supervision of patient - no hands on  Assistive Device Other (Comment) (IV Pole)  Minutes Stood 5 minutes  Minutes Ambulated 5 minutes  Distance Ambulated (ft) 25 ft  Mobility Ambulated with assistance in room  Mobility Response Tolerated well  Mobility performed by Mobility specialist  Bed Position Chair  $Mobility charge 1 Mobility

## 2021-05-15 NOTE — Progress Notes (Signed)
Occupational Therapy Treatment Patient Details Name: Scott [name] MRN: [id number] DOB: 23-Jul-1933 Today's Date: 05/15/2021    History of present illness Pt is an 87 y.o. male admitted 05/04/2021 with weight gain, LE edema and SOB. Workup for acute on chronic heart failure exacerbation. Pt also with L foot ulceration; s/p LLE angiography, aortogram 8/1. PMH includes chronic systolic heart failure, CAD s/p CABG, HTN, CKD stage IV, HLD, GIB, anemia, gout, thrombocytopenia, PAD.   OT comments  Pt. Was seen for self care and mobility. Pt. Was seen and ed on increasing I and safety with ADLs and mobility. Pt. Was able to preform standing at sink for grooming for 2 min. Pt. To be followed by acute OT>   Follow Up Recommendations  Home health OT;Supervision - Intermittent    Equipment Recommendations  None recommended by OT    Recommendations for Other Services      Precautions / Restrictions Precautions Precautions: Fall Precaution Comments: monitor SpO2 Restrictions Weight Bearing Restrictions: No       Mobility Bed Mobility Overal bed mobility: Needs Assistance Bed Mobility: Supine to Sit;Sit to Supine     Supine to sit: Min assist Sit to supine: Supervision   General bed mobility comments: HOB elevated    Transfers Overall transfer level: Modified independent Equipment used: 4-wheeled walker Transfers: Sit to/from Stand Sit to Stand: Min assist Stand pivot transfers: Min assist       General transfer comment: Reliant on momentum to power into standing; mod indep with 1x verbal cues to lock rollator brakes, good carryover of this to subsequent trial    Balance Overall balance assessment: Needs assistance Sitting-balance support: No upper extremity supported;Feet supported Sitting balance-Leahy Scale: Good     Standing balance support: Bilateral upper extremity supported;Single extremity supported;During functional activity Standing balance-Leahy Scale: Fair Standing  balance comment: Can static stand without UE support; static and dynamic stability improved wtih at least single UE support                           ADL either performed or assessed with clinical judgement   ADL Overall ADL's : Needs assistance/impaired Eating/Feeding: Independent;Sitting   Grooming: Wash/dry hands;Wash/dry face;Standing;Min guard           Upper Body Dressing : Set up;Sitting   Lower Body Dressing: Minimal assistance;Sit to/from stand   Toilet Transfer: Minimal assistance;Ambulation;Regular Toilet;Moderate assistance   Toileting- Clothing Manipulation and Hygiene: Sit to/from stand;Min guard       Functional mobility during ADLs: Minimal assistance;Rolling walker General ADL Comments: Pt. ed on use of AE for LE ADLs.     Vision   Vision Assessment?: No apparent visual deficits   Perception     Praxis      Cognition Arousal/Alertness: Awake/alert Behavior During Therapy: WFL for tasks assessed/performed;Flat affect Overall Cognitive Status: Within Functional Limits for tasks assessed                                 General Comments: WFL for simple tasks, not formally assessed        Exercises     Shoulder Instructions       General Comments      Pertinent Vitals/ Pain       Pain Assessment: 0-10 Pain Score: 5  Faces Pain Scale: Hurts a little bit Pain Location: B LE Pain Descriptors / Indicators: Aching Pain  Intervention(s): Patient requesting pain meds-RN notified  Home Living                                          Prior Functioning/Environment              Frequency  Min 2X/week        Progress Toward Goals  OT Goals(current goals can now be found in the care plan section)  Progress towards OT goals: Progressing toward goals  Acute Rehab OT Goals Patient Stated Goal: to get stronger OT Goal Formulation: With patient Time For Goal Achievement: 05/21/21 Potential to  Achieve Goals: Good ADL Goals Pt Will Perform Grooming: with modified independence;standing Pt Will Perform Lower Body Bathing: sit to/from stand;with adaptive equipment;with supervision Pt Will Perform Lower Body Dressing: with modified independence;with adaptive equipment;sit to/from stand Pt Will Transfer to Toilet: with modified independence;ambulating Pt Will Perform Tub/Shower Transfer: Tub transfer;with set-up;ambulating;shower seat Additional ADL Goal #1: Pt to verbalize at least 2 fall prevention strategies to implement in home environment.  Plan Discharge plan remains appropriate    Co-evaluation                 AM-PAC OT "6 Clicks" Daily Activity     Outcome Measure   Help from another person eating meals?: None Help from another person taking care of personal grooming?: A Little Help from another person toileting, which includes using toliet, bedpan, or urinal?: A Little Help from another person bathing (including washing, rinsing, drying)?: A Lot Help from another person to put on and taking off regular upper body clothing?: A Little Help from another person to put on and taking off regular lower body clothing?: A Lot 6 Click Score: 17    End of Session Equipment Utilized During Treatment: Rolling walker;Gait belt  OT Visit Diagnosis: Unsteadiness on feet (R26.81);Other abnormalities of gait and mobility (R26.89)   Activity Tolerance Patient tolerated treatment well   Patient Left in chair;with chair alarm set;with family/visitor present;with call bell/phone within reach   Nurse Communication Mobility status        Time: 3086-5784 OT Time Calculation (min): 25 min  Charges: OT General Charges $OT Visit: 1 Visit OT Treatments $Self Care/Home Management : 23-37 mins  Reece Packer OT/L    Calene Paradiso 05/15/2021, 1:13 PM

## 2021-05-15 NOTE — Progress Notes (Addendum)
Progress Note  Patient Name: Scott [name] Date of Encounter: 05/15/2021  [facility] Cardiologist: Peter Martinique, MD   Subjective   Feeling better, lungs improved, still with LE edema and mild weeping on left leg, right leg wrapped  Inpatient Medications    Scheduled Meds:  ALPRAZolam  0.25 mg Oral QHS   aspirin  81 mg Oral QHS   atorvastatin  20 mg Oral Daily   cholecalciferol  2,000 Units Oral Q M,W,F-2000   colchicine  0.3 mg Oral Daily   febuxostat  40 mg Oral Daily   ferrous sulfate  325 mg Oral Q M,W,F   insulin aspart  0-9 Units Subcutaneous TID WC   latanoprost  1 drop Both Eyes QHS   multivitamin with minerals  1 tablet Oral Daily   mupirocin ointment   Nasal BID   sodium chloride flush  3 mL Intravenous Q12H   tamsulosin  0.4 mg Oral QHS   Continuous Infusions:  sodium chloride     furosemide (LASIX) 200 mg in dextrose 5% 100 mL (2mg /mL) infusion 15 mg/hr (05/15/21 0435)   PRN Meds: sodium chloride, acetaminophen **OR** acetaminophen, docusate sodium, fluticasone, nitroGLYCERIN, oxyCODONE, sodium chloride flush   Vital Signs    Vitals:   05/14/21 0852 05/14/21 1123 05/14/21 1953 05/15/21 0341  BP: 113/72 124/68 125/89 119/74  Pulse: 74 67 78 67  Resp:  18 20 20   Temp:  97.7 F (36.5 C)  97.6 F (36.4 C)  TempSrc:  Oral  Oral  SpO2: 100% 100% 91% 98%  Weight:    93.9 kg  Height:        Intake/Output Summary (Last 24 hours) at 05/15/2021 1115 Last data filed at 05/15/2021 0802 Gross per 24 hour  Intake 813.43 ml  Output 1750 ml  Net -936.57 ml   Last 3 Weights 05/15/2021 05/14/2021 05/13/2021  Weight (lbs) 207 lb 1.6 oz 208 lb 6.4 oz 212 lb  Weight (kg) 93.94 kg 94.53 kg 96.163 kg      Telemetry    Sinus with 1st degree AV block PACs - Personally Reviewed  ECG    Repeat pending - Personally Reviewed  Physical Exam   GEN: elderly male in no acute distress.   Neck: No JVD Cardiac: irregular rhythm, regular rate Respiratory: Clear to  auscultation bilaterally. GI: Soft, nontender, non-distended  MS: LE edema with mild weeping on left, right wrapped Neuro:  Nonfocal  Psych: Normal affect   Labs    High Sensitivity Troponin:  No results for input(s): TROPONINIHS in the last 720 hours.    Chemistry Recent Labs  Lab 05/13/21 0228 05/14/21 0139 05/15/21 0312  NA 136 134* 136  K 4.1 3.9 3.8  CL 95* 92* 92*  CO2 32 32 36*  GLUCOSE 130* 138* 127*  BUN 76* 79* 76*  CREATININE 2.36* 2.41* 2.34*  CALCIUM 8.8* 8.9 8.9  GFRNONAA 26* 26* 26*  ANIONGAP 9 10 8      Hematology Recent Labs  Lab 05/10/21 0300 05/12/21 0338 05/13/21 0228  WBC 6.1 5.3 7.0  RBC 3.48* 3.28* 3.65*  HGB 9.9* 9.6* 10.5*  HCT 33.4* 31.5* 34.9*  MCV 96.0 96.0 95.6  MCH 28.4 29.3 28.8  MCHC 29.6* 30.5 30.1  RDW 17.5* 17.6* 17.7*  PLT 140* 122* 139*    BNPNo results for input(s): BNP, PROBNP in the last 168 hours.   DDimer No results for input(s): DDIMER in the last 168 hours.   Radiology    No results  found.  Cardiac Studies   Echo 04/10/21: 1. Left ventricular ejection fraction, by estimation, is 25 to 30%. The  left ventricle has severely decreased function. The left ventricle  demonstrates global hypokinesis. The left ventricular internal cavity size  was moderately dilated. Left  ventricular diastolic parameters are consistent with Grade II diastolic  dysfunction (pseudonormalization). Elevated left ventricular end-diastolic  pressure.   2. Right ventricular systolic function is severely reduced. The right  ventricular size is severely enlarged. There is severely elevated  pulmonary artery systolic pressure.   3. Left atrial size was severely dilated.   4. Right atrial size was moderately dilated.   5. The mitral valve is degenerative. Mild mitral valve regurgitation.   6. Tricuspid valve regurgitation is mild to moderate.   7. The aortic valve is tricuspid. There is mild calcification of the  aortic valve. There is mild  thickening of the aortic valve. Aortic valve  regurgitation is mild.   LE CO2 angiography 05/11/21: Findings: The aorta and iliac segments appear patent.  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  Bilateral renal arteries are patent.  Left common femoral artery does have some calcific disease as well as the SFA but there is no flow-limiting stenosis.  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  Below the knee we did evaluate with contrast there is a 50% stenosis of the anterior tibial artery at the takeoff but there is brisk flow all the way to the foot filling the arch only via the anterior tibial artery.  The peroneal and posterior tibial artery are not visualized.  No intervention was undertaken.              Patient Profile     87 y.o. male with CAD, chronic systolic and diastolic heart failure, pulmonary hypertension, hypertension, hyperlipidemia, diabetes, CKD 4, OSA on CPAP, and PAD admitted with acute on chronic systolic diastolic heart failure.  Assessment & Plan    Acute on chronic systolic heart failure CKD stage IV Biventricular failure - echo wit LVEF 25-30% - diuresing on lasix gtt running at 15 mg/hr - overall net negative 6.8L with 2.2 L urine output yesterday - weight is down 14 lbs - sCr 2.34 with K 3.8, BUN 76, CO2 36 - creatinine near baseline - lung exam improved, but still volume up on exam - continue lasix gtt   CAD s/p CABG (LIMA-LAD, SVG-PLA, SVG-PDA) Hyperlipidemia with LDL goal < 70 - continue ASA, statin 05/12/2021: Cholesterol 57; HDL 29; LDL Cholesterol 20; Triglycerides 39; VLDL 8 - lipids well controlled   Hypertension - home ARB on hold for renal function - SBP 110s   PAD - foot ulcer - followed by [facility], aortogram 05/11/21 with no intervention, small popliteal aneurysm and 50% stenosis  of the left anterior tibial with good distal flow   DM OSA on CPAP ?liver cirrhosis - per primary       For questions or updates, please contact [place] Please consult [email or web address] for contact info under        Signed, Ledora Bottcher, PA  05/15/2021, 11:15 AM

## 2021-05-15 NOTE — Progress Notes (Signed)
Physical Therapy Treatment Patient Details Name: Scott [name] MRN: [id number] DOB: [birth year] Today's Date: 05/15/2021    History of Present Illness Pt is an 87 y.o. male admitted 05/04/2021 with weight gain, LE edema and SOB. Workup for acute on chronic heart failure exacerbation. Pt also with L foot ulceration; s/p LLE angiography, aortogram 8/1. PMH includes chronic systolic heart failure, CAD s/p CABG, HTN, CKD stage IV, HLD, GIB, anemia, gout, thrombocytopenia, PAD.   PT Comments    Pt progressing with mobility. Pt ambulating well with rollator at supervision-level, main limitation is c/o chronic bilateral foot pain which pt attributes to h/o neuropathy. Pt hopeful for d/c home soon. Will continue to follow acutely.    Follow Up Recommendations  Home health PT;Supervision - Intermittent     Equipment Recommendations  None recommended by PT    Recommendations for Other Services       Precautions / Restrictions Precautions Precautions: Fall Restrictions Weight Bearing Restrictions: No    Mobility  Bed Mobility Overal bed mobility: Modified Independent             General bed mobility comments: HOB elevated    Transfers Overall transfer level: Modified independent Equipment used: 4-wheeled walker Transfers: Sit to/from Stand           General transfer comment: Reliant on momentum to power into standing; mod indep with 1x verbal cues to lock rollator brakes, good carryover of this to subsequent trial  Ambulation/Gait Ambulation/Gait assistance: Supervision Gait Distance (Feet): 120 Feet Assistive device: 4-wheeled walker Gait Pattern/deviations: Step-through pattern;Decreased stride length;Trunk flexed;Shuffle Gait velocity: Decreased   General Gait Details: Very slow, but mostly steady gait with rollator and supervision for safety; verbal cues for upright posture and to increase gait speed, pt able to walk 'fast' briefly but reports mainly limited by  shoes   Stairs             Wheelchair Mobility    Modified Rankin (Stroke Patients Only)       Balance Overall balance assessment: Needs assistance Sitting-balance support: No upper extremity supported;Feet supported Sitting balance-Leahy Scale: Good     Standing balance support: Bilateral upper extremity supported;Single extremity supported;During functional activity Standing balance-Leahy Scale: Fair Standing balance comment: Can static stand without UE support; static and dynamic stability improved wtih at least single UE support                            Cognition Arousal/Alertness: Awake/alert Behavior During Therapy: WFL for tasks assessed/performed;Flat affect Overall Cognitive Status: Within Functional Limits for tasks assessed                                 General Comments: WFL for simple tasks, not formally assessed      Exercises      General Comments        Pertinent Vitals/Pain Pain Assessment: Faces Faces Pain Scale: Hurts a little bit Pain Location: Bilateral feet ("they always hurt") Pain Descriptors / Indicators: Discomfort Pain Intervention(s): Monitored during session    Home Living                      Prior Function            PT Goals (current goals can now be found in the care plan section) Progress towards PT goals: Progressing toward goals  Frequency    Min 3X/week      PT Plan Current plan remains appropriate    Co-evaluation              AM-PAC PT "6 Clicks" Mobility   Outcome Measure  Help needed turning from your back to your side while in a flat bed without using bedrails?: None Help needed moving from lying on your back to sitting on the side of a flat bed without using bedrails?: None Help needed moving to and from a bed to a chair (including a wheelchair)?: None Help needed standing up from a chair using your arms (e.g., wheelchair or bedside chair)?: None Help  needed to walk in hospital room?: A Little Help needed climbing 3-5 steps with a railing? : A Little 6 Click Score: 22    End of Session   Activity Tolerance: Patient tolerated treatment well Patient left: in chair;with call bell/phone within reach Nurse Communication: Mobility status PT Visit Diagnosis: Other abnormalities of gait and mobility (R26.89)     Time: 9122-5834 PT Time Calculation (min) (ACUTE ONLY): 21 min  Charges:  $Therapeutic Exercise: 8-22 mins                     Mabeline Caras, PT, DPT Acute Rehabilitation Services  Pager [phone] Office [place] 05/15/2021, 10:00 AM

## 2021-05-15 NOTE — Progress Notes (Signed)
Progress Note    Scott [name]  RKY:[id number] DOB: [birth year]  DOA: 05/04/2021 PCP: Reubin Milan, MD      Brief Narrative:    Medical records reviewed and are as summarized below:  Scott [name] is a 87 y.o. male with medical history significant for chronic systolic and diastolic heart failure, CAD status post CABG stent, hypertension, CKD stage IV, HLD, GI bleed, chronic iron deficiency anemia, gout, thrombocytopenia, PAD, who presented to the hospital because of weight gain, lower extremity swelling and increasing shortness of breath.      Assessment/Plan:   Principal Problem:   Acute exacerbation of CHF (congestive heart failure) (HCC) Active Problems:   Hypertension   Dyslipidemia   Arteriosclerotic coronary artery disease   Diabetes mellitus, type II, insulin dependent (HCC)   OSA (obstructive sleep apnea)   PAD (peripheral artery disease) (HCC)   CKD (chronic kidney disease) stage 4, GFR 15-29 ml/min (HCC)   Iron deficiency anemia   Thrombocytopenia (HCC)   Prolonged QT interval    Body mass index is 28.09 kg/m.    Acute on chronic systolic and diastolic CHF, anasarca: Slowly improving.  Continue IV Lasix infusion.  Monitor daily weights, urine output and BMP.  Follow-up with cardiologist.  2D echo showed EF estimated at 25 to 31%, grade 2 diastolic dysfunction.  Left foot ulcer: Appreciate recommendations from wound care nurse.  ABI showed moderate right lower extremity arterial disease but no significant left lower extremity arterial disease.  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  CKD stage IV: Creatinine is stable.  Possible liver cirrhosis by ultrasound.  Outpatient follow-up with gastroenterologist.  Thrombocytopenia: This is chronic.  Platelet count is stable.  Debility: Home PT, OT and RN recommended.  Orders have been placed.  Other comorbidities include hypertension,  dyslipidemia, type II DM, CAD, Gout, OSA    Diet Order             Diet Heart Room service appropriate? Yes; Fluid consistency: Thin; Fluid restriction: 1200 mL Fluid  Diet effective now                      Consultants: Cardiologist  Procedures: Lower extremity extremity CO2 angiography on 05/11/2021    Medications:    ALPRAZolam  0.25 mg Oral QHS   aspirin  81 mg Oral QHS   atorvastatin  20 mg Oral Daily   cholecalciferol  2,000 Units Oral Q M,W,F-2000   colchicine  0.3 mg Oral Daily   febuxostat  40 mg Oral Daily   ferrous sulfate  325 mg Oral Q M,W,F   insulin aspart  0-9 Units Subcutaneous TID WC   latanoprost  1 drop Both Eyes QHS   multivitamin with minerals  1 tablet Oral Daily   mupirocin ointment   Nasal BID   sodium chloride flush  3 mL Intravenous Q12H   tamsulosin  0.4 mg Oral QHS   Continuous Infusions:  sodium chloride     furosemide (LASIX) 200 mg in dextrose 5% 100 mL (2mg /mL) infusion 15 mg/hr (05/15/21 0435)     Anti-infectives (From admission, onward)    None              Family Communication/Anticipated D/C date and plan/Code Status   DVT prophylaxis: SCDs Start: 05/05/21 1256     Code Status: DNR  Family Communication: None Disposition Plan:    Status is: Inpatient  Remains  inpatient appropriate because:IV treatments appropriate due to intensity of illness or inability to take PO  Dispo: The patient is from: Home              Anticipated d/c is to: Home              Patient currently is not medically stable to d/c.   Difficult to place patient No           Subjective:   C/o shortness of breath with exertion and leg swelling.  Objective:    Vitals:   05/14/21 1123 05/14/21 1953 05/15/21 0341 05/15/21 1155  BP: 124/68 125/89 119/74 118/68  Pulse: 67 78 67 65  Resp: 18 20 20 14   Temp: 97.7 F (36.5 C)  97.6 F (36.4 C) 97.6 F (36.4 C)  TempSrc: Oral  Oral Oral  SpO2: 100% 91% 98% 94%   Weight:   93.9 kg   Height:       No data found.   Intake/Output Summary (Last 24 hours) at 05/15/2021 1202 Last data filed at 05/15/2021 1118 Gross per 24 hour  Intake 813.43 ml  Output 1950 ml  Net -1136.57 ml   Filed Weights   05/13/21 0332 05/14/21 0453 05/15/21 0341  Weight: 96.2 kg 94.5 kg 93.9 kg    Exam:  GEN: NAD SKIN: Warm and dry EYES: No pallor or icterus ENT: MMM CV: RRR PULM: CTA B ABD: soft, distended, abdominal hernia, NT, +BS CNS: AAO x 3, non focal EXT: Bilateral lower extremity edema from thighs to the feet slowly improving.  No erythema or tenderness.           Data Reviewed:   I have personally reviewed following labs and imaging studies:  Labs: Labs show the following:   Basic Metabolic Panel: Recent Labs  Lab 05/11/21 0239 05/12/21 0338 05/13/21 0228 05/14/21 0139 05/15/21 0312  NA 135 136 136 134* 136  K 4.2 4.0 4.1 3.9 3.8  CL 98 94* 95* 92* 92*  CO2 27 31 32 32 36*  GLUCOSE 132* 138* 130* 138* 127*  BUN 75* 76* 76* 79* 76*  CREATININE 2.29* 2.41* 2.36* 2.41* 2.34*  CALCIUM 8.8* 8.9 8.8* 8.9 8.9  MG  --   --   --   --  2.0   GFR Estimated Creatinine Clearance: 27 mL/min (A) (by C-G formula based on SCr of 2.34 mg/dL (H)). Liver Function Tests: No results for input(s): AST, ALT, ALKPHOS, BILITOT, PROT, ALBUMIN in the last 168 hours.  No results for input(s): LIPASE, AMYLASE in the last 168 hours. No results for input(s): AMMONIA in the last 168 hours. Coagulation profile No results for input(s): INR, PROTIME in the last 168 hours.   CBC: Recent Labs  Lab 05/09/21 0729 05/10/21 0300 05/12/21 0338 05/13/21 0228  WBC 5.2 6.1 5.3 7.0  HGB 9.7* 9.9* 9.6* 10.5*  HCT 32.9* 33.4* 31.5* 34.9*  MCV 97.1 96.0 96.0 95.6  PLT 130* 140* 122* 139*   Cardiac Enzymes: No results for input(s): CKTOTAL, CKMB, CKMBINDEX, TROPONINI in the last 168 hours. BNP (last 3 results) No results for input(s): PROBNP in the last 8760  hours. CBG: Recent Labs  Lab 05/14/21 1629 05/14/21 2058 05/14/21 2250 05/15/21 0541 05/15/21 1157  GLUCAP 198* 231* 169* 118* 156*   D-Dimer: No results for input(s): DDIMER in the last 72 hours. Hgb A1c: No results for input(s): HGBA1C in the last 72 hours. Lipid Profile: No results for input(s): CHOL, HDL, LDLCALC, TRIG,  CHOLHDL, LDLDIRECT in the last 72 hours.  Thyroid function studies: No results for input(s): TSH, T4TOTAL, T3FREE, THYROIDAB in the last 72 hours.  Invalid input(s): FREET3 Anemia work up: No results for input(s): VITAMINB12, FOLATE, FERRITIN, TIBC, IRON, RETICCTPCT in the last 72 hours. Sepsis Labs: Recent Labs  Lab 05/09/21 0729 05/10/21 0300 05/12/21 0338 05/13/21 0228  WBC 5.2 6.1 5.3 7.0    Microbiology No results found for this or any previous visit (from the past 240 hour(s)).   Procedures and diagnostic studies:  No results found.             LOS: 10 days   Ameliya Nicotra  [facility]   Pager on [email or web address]. If 7PM-7AM, please contact night-coverage at [email or web address]     05/15/2021, 12:02 PM

## 2021-05-16 DIAGNOSIS — I5043 Acute on chronic combined systolic (congestive) and diastolic (congestive) heart failure: Secondary | ICD-10-CM | POA: Diagnosis not present

## 2021-05-16 DIAGNOSIS — N184 Chronic kidney disease, stage 4 (severe): Secondary | ICD-10-CM | POA: Diagnosis not present

## 2021-05-16 LAB — CBC WITH DIFFERENTIAL/PLATELET
Abs Immature Granulocytes: 0.03 10*3/uL (ref 0.00–0.07)
Basophils Absolute: 0.1 10*3/uL (ref 0.0–0.1)
Basophils Relative: 1 %
Eosinophils Absolute: 0.3 10*3/uL (ref 0.0–0.5)
Eosinophils Relative: 5 %
HCT: 31.7 % — ABNORMAL LOW (ref 39.0–52.0)
Hemoglobin: 9.5 g/dL — ABNORMAL LOW (ref 13.0–17.0)
Immature Granulocytes: 1 %
Lymphocytes Relative: 18 %
Lymphs Abs: 1.1 10*3/uL (ref 0.7–4.0)
MCH: 28.4 pg (ref 26.0–34.0)
MCHC: 30 g/dL (ref 30.0–36.0)
MCV: 94.9 fL (ref 80.0–100.0)
Monocytes Absolute: 1.1 10*3/uL — ABNORMAL HIGH (ref 0.1–1.0)
Monocytes Relative: 19 %
Neutro Abs: 3.4 10*3/uL (ref 1.7–7.7)
Neutrophils Relative %: 56 %
Platelets: 94 10*3/uL — ABNORMAL LOW (ref 150–400)
RBC: 3.34 MIL/uL — ABNORMAL LOW (ref 4.22–5.81)
RDW: 17.9 % — ABNORMAL HIGH (ref 11.5–15.5)
WBC: 6 10*3/uL (ref 4.0–10.5)
nRBC: 0 % (ref 0.0–0.2)

## 2021-05-16 LAB — BASIC METABOLIC PANEL
Anion gap: 9 (ref 5–15)
BUN: 76 mg/dL — ABNORMAL HIGH (ref 8–23)
CO2: 36 mmol/L — ABNORMAL HIGH (ref 22–32)
Calcium: 9 mg/dL (ref 8.9–10.3)
Chloride: 90 mmol/L — ABNORMAL LOW (ref 98–111)
Creatinine, Ser: 2.38 mg/dL — ABNORMAL HIGH (ref 0.61–1.24)
GFR, Estimated: 26 mL/min — ABNORMAL LOW (ref >60)
Glucose, Bld: 120 mg/dL — ABNORMAL HIGH (ref 70–99)
Potassium: 3.6 mmol/L (ref 3.5–5.1)
Sodium: 135 mmol/L (ref 135–145)

## 2021-05-16 LAB — GLUCOSE, CAPILLARY
Glucose-Capillary: 119 mg/dL — ABNORMAL HIGH (ref 70–99)
Glucose-Capillary: 146 mg/dL — ABNORMAL HIGH (ref 70–99)
Glucose-Capillary: 189 mg/dL — ABNORMAL HIGH (ref 70–99)
Glucose-Capillary: 236 mg/dL — ABNORMAL HIGH (ref 70–99)

## 2021-05-16 LAB — MAGNESIUM: Magnesium: 2 mg/dL (ref 1.7–2.4)

## 2021-05-16 MED ORDER — METOLAZONE 5 MG PO TABS
10.0000 mg | ORAL_TABLET | Freq: Once | ORAL | Status: AC
  Administered 2021-05-16: 10 mg via ORAL
  Filled 2021-05-16: qty 2

## 2021-05-16 NOTE — Progress Notes (Signed)
Progress Note  Patient Name: Scott [name] Date of Encounter: 05/16/2021  Primary Cardiologist:   Peter Martinique, MD   Subjective   Some SOB but no pain.  No acute distress  Inpatient Medications    Scheduled Meds:  ALPRAZolam  0.25 mg Oral QHS   aspirin  81 mg Oral QHS   atorvastatin  20 mg Oral Daily   cholecalciferol  2,000 Units Oral Q M,W,F-2000   colchicine  0.3 mg Oral Daily   febuxostat  40 mg Oral Daily   ferrous sulfate  325 mg Oral Q M,W,F   insulin aspart  0-9 Units Subcutaneous TID WC   latanoprost  1 drop Both Eyes QHS   multivitamin with minerals  1 tablet Oral Daily   mupirocin ointment   Nasal BID   sodium chloride flush  3 mL Intravenous Q12H   tamsulosin  0.4 mg Oral QHS   Continuous Infusions:  sodium chloride     furosemide (LASIX) 200 mg in dextrose 5% 100 mL (2mg /mL) infusion 15 mg/hr (05/15/21 1915)   PRN Meds: sodium chloride, acetaminophen **OR** acetaminophen, docusate sodium, fluticasone, nitroGLYCERIN, oxyCODONE, sodium chloride flush   Vital Signs    Vitals:   05/15/21 0341 05/15/21 1155 05/15/21 2329 05/16/21 0331  BP: 119/74 118/68  118/69  Pulse: 67 65  70  Resp: 20 14  17   Temp: 97.6 F (36.4 C) 97.6 F (36.4 C)  97.7 F (36.5 C)  TempSrc: Oral Oral  Oral  SpO2: 98% 94% 99% 94%  Weight: 93.9 kg   92.4 kg  Height:        Intake/Output Summary (Last 24 hours) at 05/16/2021 1036 Last data filed at 05/16/2021 0925 Gross per 24 hour  Intake 770.61 ml  Output 1925 ml  Net -1154.39 ml   Filed Weights   05/14/21 0453 05/15/21 0341 05/16/21 0331  Weight: 94.5 kg 93.9 kg 92.4 kg    Telemetry    NSR, NSVT - Personally Reviewed  ECG    NA - Personally Reviewed  Physical Exam   GEN: No acute distress.   Neck: No  JVD Cardiac: RRR, no murmurs, rubs, or gallops.  (Distant heart sounds) Respiratory:    Bilateral basilar crackles GI: Soft, nontender, non-distended  MS:      Severe leg edema; No deformity. Neuro:  Nonfocal   Psych: Normal affect   Labs    Chemistry Recent Labs  Lab 05/14/21 0139 05/15/21 0312 05/16/21 0426  NA 134* 136 135  K 3.9 3.8 3.6  CL 92* 92* 90*  CO2 32 36* 36*  GLUCOSE 138* 127* 120*  BUN 79* 76* 76*  CREATININE 2.41* 2.34* 2.38*  CALCIUM 8.9 8.9 9.0  GFRNONAA 26* 26* 26*  ANIONGAP 10 8 9      Hematology Recent Labs  Lab 05/12/21 0338 05/13/21 0228 05/16/21 0426  WBC 5.3 7.0 6.0  RBC 3.28* 3.65* 3.34*  HGB 9.6* 10.5* 9.5*  HCT 31.5* 34.9* 31.7*  MCV 96.0 95.6 94.9  MCH 29.3 28.8 28.4  MCHC 30.5 30.1 30.0  RDW 17.6* 17.7* 17.9*  PLT 122* 139* 94*    Cardiac EnzymesNo results for input(s): TROPONINI in the last 168 hours. No results for input(s): TROPIPOC in the last 168 hours.   BNPNo results for input(s): BNP, PROBNP in the last 168 hours.   DDimer No results for input(s): DDIMER in the last 168 hours.   Radiology    No results found.  Cardiac Studies   Echo 04/10/21:  1. Left ventricular ejection fraction, by estimation, is 25 to 30%. The  left ventricle has severely decreased function. The left ventricle  demonstrates global hypokinesis. The left ventricular internal cavity size  was moderately dilated. Left  ventricular diastolic parameters are consistent with Grade II diastolic  dysfunction (pseudonormalization). Elevated left ventricular end-diastolic  pressure.   2. Right ventricular systolic function is severely reduced. The right  ventricular size is severely enlarged. There is severely elevated  pulmonary artery systolic pressure.   3. Left atrial size was severely dilated.   4. Right atrial size was moderately dilated.   5. The mitral valve is degenerative. Mild mitral valve regurgitation.   6. Tricuspid valve regurgitation is mild to moderate.   7. The aortic valve is tricuspid. There is mild calcification of the  aortic valve. There is mild thickening of the aortic valve. Aortic valve  regurgitation is mild.   LE CO2 angiography  05/11/21: The aorta and iliac segments appear patent.  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  Bilateral renal arteries are patent.  Left common femoral artery does have some calcific disease as well as the SFA but there is no flow-limiting stenosis.  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  Below the knee we did evaluate with contrast there is a 50% stenosis of the anterior tibial artery at the takeoff but there is brisk flow all the way to the foot filling the arch only via the anterior tibial artery.  The peroneal and posterior tibial artery are not visualized.  No intervention was undertaken.     Patient Profile     87 y.o. male with medical history significant for chronic systolic and diastolic heart failure, CAD status post CABG stent, hypertension, CKD stage IV, HLD, GI bleed, chronic iron deficiency anemia, gout, thrombocytopenia, PAD, who presented to the hospital because of weight gain, lower extremity swelling and increasing shortness of breath.  Assessment & Plan    Acute on chronic systolic and diastolic HF:   Given 10 mg Zaroxolyn yesterday.  Better urine out put yesterday.  1.5 liters plus.  Continue current diuresis.  Will give Zaroxolyn again today.   CKD IV:       Creat elevated but stable.  Holding on ARB.     PVD:    No intervention.  Results of angiogram as above.   For questions or updates, please contact [place] Please consult [email or web address] for contact info under Cardiology/STEMI.   Signed, Minus Breeding, MD  05/16/2021, 10:36 AM

## 2021-05-16 NOTE — Progress Notes (Signed)
Progress Note    Scott [name]  POE:[id number] DOB: [birth year]  DOA: 05/04/2021 PCP: Reubin Milan, MD      Brief Narrative:    Medical records reviewed and are as summarized below:  Scott [name] is a 87 y.o. male with medical history significant for chronic systolic and diastolic heart failure, CAD status post CABG stent, hypertension, CKD stage IV, HLD, GI bleed, chronic iron deficiency anemia, gout, thrombocytopenia, PAD, who presented to the hospital because of weight gain, lower extremity swelling and increasing shortness of breath.      Assessment/Plan:   Principal Problem:   Acute exacerbation of CHF (congestive heart failure) (HCC) Active Problems:   Hypertension   Dyslipidemia   Arteriosclerotic coronary artery disease   Diabetes mellitus, type II, insulin dependent (HCC)   OSA (obstructive sleep apnea)   PAD (peripheral artery disease) (HCC)   CKD (chronic kidney disease) stage 4, GFR 15-29 ml/min (HCC)   Iron deficiency anemia   Thrombocytopenia (HCC)   Prolonged QT interval   Ascites    Body mass index is 27.64 kg/m.    Acute on chronic systolic and diastolic CHF, anasarca: He is improving albeit slowly.  Continue IV Lasix infusion.  Monitor daily weights, urine output and BMP.  Follow-up with cardiologist.  2D echo showed EF estimated at 25 to 31%, grade 2 diastolic dysfunction.  Left foot ulcer: Appreciate recommendations from wound care nurse.  ABI showed moderate right lower extremity arterial disease but no significant left lower extremity arterial disease.  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  CKD stage IV: Creatinine is stable.  Possible liver cirrhosis by ultrasound.  Outpatient follow-up with gastroenterologist.  Thrombocytopenia: This is chronic.  Platelet count is stable.  Debility: Home PT, OT and RN recommended.  Orders have been placed.  Other comorbidities include  hypertension, dyslipidemia, type II DM, CAD, Gout, OSA    Diet Order             Diet Heart Room service appropriate? Yes; Fluid consistency: Thin; Fluid restriction: 1200 mL Fluid  Diet effective now                      Consultants: Cardiologist  Procedures: Lower extremity extremity CO2 angiography on 05/11/2021    Medications:    ALPRAZolam  0.25 mg Oral QHS   aspirin  81 mg Oral QHS   atorvastatin  20 mg Oral Daily   cholecalciferol  2,000 Units Oral Q M,W,F-2000   colchicine  0.3 mg Oral Daily   febuxostat  40 mg Oral Daily   ferrous sulfate  325 mg Oral Q M,W,F   insulin aspart  0-9 Units Subcutaneous TID WC   latanoprost  1 drop Both Eyes QHS   multivitamin with minerals  1 tablet Oral Daily   mupirocin ointment   Nasal BID   sodium chloride flush  3 mL Intravenous Q12H   tamsulosin  0.4 mg Oral QHS   Continuous Infusions:  sodium chloride     furosemide (LASIX) 200 mg in dextrose 5% 100 mL (2mg /mL) infusion 15 mg/hr (05/15/21 1915)     Anti-infectives (From admission, onward)    None              Family Communication/Anticipated D/C date and plan/Code Status   DVT prophylaxis: SCDs Start: 05/05/21 1256     Code Status: DNR  Family Communication: None Disposition Plan:  Status is: Inpatient  Remains inpatient appropriate because:IV treatments appropriate due to intensity of illness or inability to take PO  Dispo: The patient is from: Home              Anticipated d/c is to: Home              Patient currently is not medically stable to d/c.   Difficult to place patient No           Subjective:   C/o sensation of shortness of breath and leg swelling.  He feels a little better today.  Objective:    Vitals:   05/15/21 0341 05/15/21 1155 05/15/21 2329 05/16/21 0331  BP: 119/74 118/68  118/69  Pulse: 67 65  70  Resp: 20 14  17   Temp: 97.6 F (36.4 C) 97.6 F (36.4 C)  97.7 F (36.5 C)  TempSrc: Oral Oral   Oral  SpO2: 98% 94% 99% 94%  Weight: 93.9 kg   92.4 kg  Height:       No data found.   Intake/Output Summary (Last 24 hours) at 05/16/2021 1014 Last data filed at 05/16/2021 0925 Gross per 24 hour  Intake 770.61 ml  Output 1925 ml  Net -1154.39 ml   Filed Weights   05/14/21 0453 05/15/21 0341 05/16/21 0331  Weight: 94.5 kg 93.9 kg 92.4 kg    Exam:  GEN: NAD SKIN: No rash EYES: EOMI ENT: MMM CV: RRR PULM: CTA B ABD: soft, distended, abdominal hernia,, NT, +BS CNS: AAO x 3, non focal EXT: Bilateral lower extremity edema from the thighs to the feet but appears to be improving.  No erythema or tenderness            Data Reviewed:   I have personally reviewed following labs and imaging studies:  Labs: Labs show the following:   Basic Metabolic Panel: Recent Labs  Lab 05/12/21 0338 05/13/21 0228 05/14/21 0139 05/15/21 0312 05/16/21 0426  NA 136 136 134* 136 135  K 4.0 4.1 3.9 3.8 3.6  CL 94* 95* 92* 92* 90*  CO2 31 32 32 36* 36*  GLUCOSE 138* 130* 138* 127* 120*  BUN 76* 76* 79* 76* 76*  CREATININE 2.41* 2.36* 2.41* 2.34* 2.38*  CALCIUM 8.9 8.8* 8.9 8.9 9.0  MG  --   --   --  2.0 2.0   GFR Estimated Creatinine Clearance: 24.5 mL/min (A) (by C-G formula based on SCr of 2.38 mg/dL (H)). Liver Function Tests: No results for input(s): AST, ALT, ALKPHOS, BILITOT, PROT, ALBUMIN in the last 168 hours.  No results for input(s): LIPASE, AMYLASE in the last 168 hours. No results for input(s): AMMONIA in the last 168 hours. Coagulation profile No results for input(s): INR, PROTIME in the last 168 hours.   CBC: Recent Labs  Lab 05/10/21 0300 05/12/21 0338 05/13/21 0228 05/16/21 0426  WBC 6.1 5.3 7.0 6.0  NEUTROABS  --   --   --  3.4  HGB 9.9* 9.6* 10.5* 9.5*  HCT 33.4* 31.5* 34.9* 31.7*  MCV 96.0 96.0 95.6 94.9  PLT 140* 122* 139* 94*   Cardiac Enzymes: No results for input(s): CKTOTAL, CKMB, CKMBINDEX, TROPONINI in the last 168 hours. BNP (last  3 results) No results for input(s): PROBNP in the last 8760 hours. CBG: Recent Labs  Lab 05/15/21 0541 05/15/21 1157 05/15/21 1638 05/15/21 2058 05/16/21 0608  GLUCAP 118* 156* 160* 147* 119*   D-Dimer: No results for input(s): DDIMER in the last  72 hours. Hgb A1c: No results for input(s): HGBA1C in the last 72 hours. Lipid Profile: No results for input(s): CHOL, HDL, LDLCALC, TRIG, CHOLHDL, LDLDIRECT in the last 72 hours.  Thyroid function studies: No results for input(s): TSH, T4TOTAL, T3FREE, THYROIDAB in the last 72 hours.  Invalid input(s): FREET3 Anemia work up: No results for input(s): VITAMINB12, FOLATE, FERRITIN, TIBC, IRON, RETICCTPCT in the last 72 hours. Sepsis Labs: Recent Labs  Lab 05/10/21 0300 05/12/21 0338 05/13/21 0228 05/16/21 0426  WBC 6.1 5.3 7.0 6.0    Microbiology No results found for this or any previous visit (from the past 240 hour(s)).   Procedures and diagnostic studies:  No results found.             LOS: 11 days   [place] Copywriter, advertising on [email or web address]. If 7PM-7AM, please contact night-coverage at [email or web address]     05/16/2021, 10:14 AM

## 2021-05-16 NOTE — Plan of Care (Signed)
  Problem: Health Behavior/Discharge Planning: Goal: Ability to manage health-related needs will improve Outcome: Progressing   Problem: Clinical Measurements: Goal: Ability to maintain clinical measurements within normal limits will improve Outcome: Progressing   Problem: Activity: Goal: Risk for activity intolerance will decrease Outcome: Progressing   Problem: Nutrition: Goal: Adequate nutrition will be maintained Outcome: Progressing   

## 2021-05-17 DIAGNOSIS — N184 Chronic kidney disease, stage 4 (severe): Secondary | ICD-10-CM | POA: Diagnosis not present

## 2021-05-17 DIAGNOSIS — I5043 Acute on chronic combined systolic (congestive) and diastolic (congestive) heart failure: Secondary | ICD-10-CM | POA: Diagnosis not present

## 2021-05-17 LAB — BASIC METABOLIC PANEL
Anion gap: 9 (ref 5–15)
BUN: 79 mg/dL — ABNORMAL HIGH (ref 8–23)
CO2: 37 mmol/L — ABNORMAL HIGH (ref 22–32)
Calcium: 9 mg/dL (ref 8.9–10.3)
Chloride: 90 mmol/L — ABNORMAL LOW (ref 98–111)
Creatinine, Ser: 2.44 mg/dL — ABNORMAL HIGH (ref 0.61–1.24)
GFR, Estimated: 25 mL/min — ABNORMAL LOW (ref >60)
Glucose, Bld: 139 mg/dL — ABNORMAL HIGH (ref 70–99)
Potassium: 3.9 mmol/L (ref 3.5–5.1)
Sodium: 136 mmol/L (ref 135–145)

## 2021-05-17 LAB — GLUCOSE, CAPILLARY
Glucose-Capillary: 129 mg/dL — ABNORMAL HIGH (ref 70–99)
Glucose-Capillary: 177 mg/dL — ABNORMAL HIGH (ref 70–99)
Glucose-Capillary: 210 mg/dL — ABNORMAL HIGH (ref 70–99)
Glucose-Capillary: 199 mg/dL — ABNORMAL HIGH (ref 70–99)

## 2021-05-17 NOTE — Progress Notes (Addendum)
Progress Note    Scott [name]  RKY:[id number] DOB: 1933-04-27  DOA: 05/04/2021 PCP: Reubin Milan, MD      Brief Narrative:    Medical records reviewed and are as summarized below:  Scott [name] is a 87 y.o. male with medical history significant for chronic systolic and diastolic heart failure, CAD status post CABG stent, hypertension, CKD stage IV, HLD, GI bleed, chronic iron deficiency anemia, gout, thrombocytopenia, PAD, who presented to the hospital because of weight gain, lower extremity swelling and increasing shortness of breath.      Assessment/Plan:   Principal Problem:   Acute exacerbation of CHF (congestive heart failure) (HCC) Active Problems:   Hypertension   Dyslipidemia   Arteriosclerotic coronary artery disease   Diabetes mellitus, type II, insulin dependent (HCC)   OSA (obstructive sleep apnea)   Acute on chronic combined systolic and diastolic CHF (congestive heart failure) (HCC)   PAD (peripheral artery disease) (HCC)   CKD (chronic kidney disease) stage 4, GFR 15-29 ml/min (HCC)   Iron deficiency anemia   Thrombocytopenia (HCC)   Prolonged QT interval   Ascites    Body mass index is 24.78 kg/m.    Acute on chronic systolic and diastolic CHF, anasarca: Improving.  Continue IV Lasix infusion.  Monitor daily weights, urine output and BMP.  Follow-up with cardiologist.  2D echo showed EF estimated at 25 to 31%, grade 2 diastolic dysfunction.  Left foot ulcer: Appreciate recommendations from wound care nurse.  ABI showed moderate right lower extremity arterial disease but no significant left lower extremity arterial disease.  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  CKD stage IV: Creatinine is stable.  Possible liver cirrhosis by ultrasound.  Outpatient follow-up with gastroenterologist.  Thrombocytopenia: This is chronic.  Platelet is trending down.  Repeat CBC tomorrow  Debility:  Home PT, OT and RN recommended.  Orders have been placed.  Other comorbidities include hypertension, dyslipidemia, type II DM, CAD, Gout, OSA    Diet Order             Diet Heart Room service appropriate? Yes; Fluid consistency: Thin; Fluid restriction: 1200 mL Fluid  Diet effective now                      Consultants: Cardiologist  Procedures: Lower extremity extremity CO2 angiography on 05/11/2021    Medications:    ALPRAZolam  0.25 mg Oral QHS   aspirin  81 mg Oral QHS   atorvastatin  20 mg Oral Daily   cholecalciferol  2,000 Units Oral Q M,W,F-2000   colchicine  0.3 mg Oral Daily   febuxostat  40 mg Oral Daily   ferrous sulfate  325 mg Oral Q M,W,F   insulin aspart  0-9 Units Subcutaneous TID WC   latanoprost  1 drop Both Eyes QHS   multivitamin with minerals  1 tablet Oral Daily   mupirocin ointment   Nasal BID   sodium chloride flush  3 mL Intravenous Q12H   tamsulosin  0.4 mg Oral QHS   Continuous Infusions:  sodium chloride     furosemide (LASIX) 200 mg in dextrose 5% 100 mL (2mg /mL) infusion 15 mg/hr (05/17/21 0210)     Anti-infectives (From admission, onward)    None              Family Communication/Anticipated D/C date and plan/Code Status   DVT prophylaxis: SCDs Start: 05/05/21 1256  Code Status: DNR  Family Communication: None Disposition Plan:    Status is: Inpatient  Remains inpatient appropriate because:IV treatments appropriate due to intensity of illness or inability to take PO  Dispo: The patient is from: Home              Anticipated d/c is to: Home              Patient currently is not medically stable to d/c.   Difficult to place patient No           Subjective:   Interval events noted.  He feels a little better today although is still short of breath with exertion.  Leg swelling is improving.  Objective:    Vitals:   05/16/21 2325 05/17/21 0136 05/17/21 0343 05/17/21 1051  BP:   123/77  119/71  Pulse:   60 (!) 56  Resp:   17 19  Temp:   97.9 F (36.6 C) (!) 97.4 F (36.3 C)  TempSrc:   Oral Oral  SpO2: 98%  94% 93%  Weight:  82.9 kg    Height:       No data found.   Intake/Output Summary (Last 24 hours) at 05/17/2021 1242 Last data filed at 05/17/2021 2536 Gross per 24 hour  Intake 720 ml  Output 1625 ml  Net -905 ml   Filed Weights   05/15/21 0341 05/16/21 0331 05/17/21 0136  Weight: 93.9 kg 92.4 kg 82.9 kg    Exam:  GEN: NAD SKIN: Warm and dry EYES: No pallor or icterus ENT: MMM CV: RRR PULM: CTA B ABD: soft, distended, abdominal hernia, NT, +BS CNS: AAO x 3, non focal EXT: Bilateral lower extremity edema is improving           Data Reviewed:   I have personally reviewed following labs and imaging studies:  Labs: Labs show the following:   Basic Metabolic Panel: Recent Labs  Lab 05/13/21 0228 05/14/21 0139 05/15/21 0312 05/16/21 0426 05/17/21 0312  NA 136 134* 136 135 136  K 4.1 3.9 3.8 3.6 3.9  CL 95* 92* 92* 90* 90*  CO2 32 32 36* 36* 37*  GLUCOSE 130* 138* 127* 120* 139*  BUN 76* 79* 76* 76* 79*  CREATININE 2.36* 2.41* 2.34* 2.38* 2.44*  CALCIUM 8.8* 8.9 8.9 9.0 9.0  MG  --   --  2.0 2.0  --    GFR Estimated Creatinine Clearance: 23.9 mL/min (A) (by C-G formula based on SCr of 2.44 mg/dL (H)). Liver Function Tests: No results for input(s): AST, ALT, ALKPHOS, BILITOT, PROT, ALBUMIN in the last 168 hours.  No results for input(s): LIPASE, AMYLASE in the last 168 hours. No results for input(s): AMMONIA in the last 168 hours. Coagulation profile No results for input(s): INR, PROTIME in the last 168 hours.   CBC: Recent Labs  Lab 05/12/21 0338 05/13/21 0228 05/16/21 0426  WBC 5.3 7.0 6.0  NEUTROABS  --   --  3.4  HGB 9.6* 10.5* 9.5*  HCT 31.5* 34.9* 31.7*  MCV 96.0 95.6 94.9  PLT 122* 139* 94*   Cardiac Enzymes: No results for input(s): CKTOTAL, CKMB, CKMBINDEX, TROPONINI in the last 168 hours. BNP (last 3  results) No results for input(s): PROBNP in the last 8760 hours. CBG: Recent Labs  Lab 05/16/21 1154 05/16/21 1629 05/16/21 2114 05/17/21 0610 05/17/21 1053  GLUCAP 146* 189* 236* 129* 177*   D-Dimer: No results for input(s): DDIMER in the last 72 hours. Hgb A1c:  No results for input(s): HGBA1C in the last 72 hours. Lipid Profile: No results for input(s): CHOL, HDL, LDLCALC, TRIG, CHOLHDL, LDLDIRECT in the last 72 hours.  Thyroid function studies: No results for input(s): TSH, T4TOTAL, T3FREE, THYROIDAB in the last 72 hours.  Invalid input(s): FREET3 Anemia work up: No results for input(s): VITAMINB12, FOLATE, FERRITIN, TIBC, IRON, RETICCTPCT in the last 72 hours. Sepsis Labs: Recent Labs  Lab 05/12/21 0338 05/13/21 0228 05/16/21 0426  WBC 5.3 7.0 6.0    Microbiology No results found for this or any previous visit (from the past 240 hour(s)).   Procedures and diagnostic studies:  No results found.             LOS: 12 days   [place] Copywriter, advertising on [email or web address]. If 7PM-7AM, please contact night-coverage at [email or web address]     05/17/2021, 12:42 PM

## 2021-05-17 NOTE — Progress Notes (Signed)
Progress Note   Subjective   Doing well today, the patient denies CP.  Remains SOB.  Continues to have orthopnea.  No new concerns  Inpatient Medications    Scheduled Meds:  ALPRAZolam  0.25 mg Oral QHS   aspirin  81 mg Oral QHS   atorvastatin  20 mg Oral Daily   cholecalciferol  2,000 Units Oral Q M,W,F-2000   colchicine  0.3 mg Oral Daily   febuxostat  40 mg Oral Daily   ferrous sulfate  325 mg Oral Q M,W,F   insulin aspart  0-9 Units Subcutaneous TID WC   latanoprost  1 drop Both Eyes QHS   multivitamin with minerals  1 tablet Oral Daily   mupirocin ointment   Nasal BID   sodium chloride flush  3 mL Intravenous Q12H   tamsulosin  0.4 mg Oral QHS   Continuous Infusions:  sodium chloride     furosemide (LASIX) 200 mg in dextrose 5% 100 mL (2mg /mL) infusion 15 mg/hr (05/17/21 0210)   PRN Meds: sodium chloride, acetaminophen **OR** acetaminophen, docusate sodium, fluticasone, nitroGLYCERIN, oxyCODONE, sodium chloride flush   Vital Signs    Vitals:   05/16/21 1936 05/16/21 2325 05/17/21 0136 05/17/21 0343  BP: 125/71   123/77  Pulse: 68   60  Resp: 17   17  Temp: 97.8 F (36.6 C)   97.9 F (36.6 C)  TempSrc: Oral   Oral  SpO2: 97% 98%  94%  Weight:   82.9 kg   Height:        Intake/Output Summary (Last 24 hours) at 05/17/2021 1001 Last data filed at 05/17/2021 1194 Gross per 24 hour  Intake 720 ml  Output 1875 ml  Net -1155 ml   Filed Weights   05/15/21 0341 05/16/21 0331 05/17/21 0136  Weight: 93.9 kg 92.4 kg 82.9 kg    Telemetry    Sinus with mobitz I second degree AV block and frequent ectopy - Personally Reviewed  Physical Exam   GEN- The patient is elderly appearing, alert and oriented x 3 today.   Head- normocephalic, atraumatic Eyes-  Sclera clear, conjunctiva pink Ears- hearing intact Oropharynx- clear Neck- supple,  + JVD Lungs-  normal work of breathing, decreased BS at bases, + Rales Heart- Regular rate and rhythm  GI- soft   Extremities- no clubbing, cyanosis, + dependant edema  MS- no significant deformity or atrophy Skin- no rash or lesion Psych- euthymic mood, full affect Neuro- strength and sensation are intact   Labs    Chemistry Recent Labs  Lab 05/15/21 0312 05/16/21 0426 05/17/21 0312  NA 136 135 136  K 3.8 3.6 3.9  CL 92* 90* 90*  CO2 36* 36* 37*  GLUCOSE 127* 120* 139*  BUN 76* 76* 79*  CREATININE 2.34* 2.38* 2.44*  CALCIUM 8.9 9.0 9.0  GFRNONAA 26* 26* 25*  ANIONGAP 8 9 9      Hematology Recent Labs  Lab 05/12/21 0338 05/13/21 0228 05/16/21 0426  WBC 5.3 7.0 6.0  RBC 3.28* 3.65* 3.34*  HGB 9.6* 10.5* 9.5*  HCT 31.5* 34.9* 31.7*  MCV 96.0 95.6 94.9  MCH 29.3 28.8 28.4  MCHC 30.5 30.1 30.0  RDW 17.6* 17.7* 17.9*  PLT 122* 139* 94*     Patient ID  87 y.o. male with medical history significant for chronic systolic and diastolic heart failure, CAD status post CABG stent, hypertension, CKD stage IV, HLD, GI bleed, chronic iron deficiency anemia, gout, thrombocytopenia, PAD, who presented to the hospital  because of weight gain, lower extremity swelling and increasing shortness of breath  Assessment & Plan    1.  Acute on chronic combined systolic and diastolic dysfunction Remains volume overloaded Given zaroxylyn 10mg  the past 2 days.   Diuresing well.  Will reconsider zaroxylyn tomorrow Continue IV lasix drip  2. Acute on chronic renal failure, stage IV Creatinine remains elevated but stable ARB on hold  3. PVP Stable No change required today   Thompson Grayer MD, [facility] 05/17/2021 10:01 AM

## 2021-05-18 DIAGNOSIS — N184 Chronic kidney disease, stage 4 (severe): Secondary | ICD-10-CM | POA: Diagnosis not present

## 2021-05-18 DIAGNOSIS — D696 Thrombocytopenia, unspecified: Secondary | ICD-10-CM

## 2021-05-18 DIAGNOSIS — I5043 Acute on chronic combined systolic (congestive) and diastolic (congestive) heart failure: Secondary | ICD-10-CM | POA: Diagnosis not present

## 2021-05-18 DIAGNOSIS — I739 Peripheral vascular disease, unspecified: Secondary | ICD-10-CM | POA: Diagnosis not present

## 2021-05-18 DIAGNOSIS — I2510 Atherosclerotic heart disease of native coronary artery without angina pectoris: Secondary | ICD-10-CM | POA: Diagnosis not present

## 2021-05-18 LAB — GLUCOSE, CAPILLARY
Glucose-Capillary: 146 mg/dL — ABNORMAL HIGH (ref 70–99)
Glucose-Capillary: 177 mg/dL — ABNORMAL HIGH (ref 70–99)
Glucose-Capillary: 206 mg/dL — ABNORMAL HIGH (ref 70–99)
Glucose-Capillary: 227 mg/dL — ABNORMAL HIGH (ref 70–99)

## 2021-05-18 LAB — CBC WITH DIFFERENTIAL/PLATELET
Abs Immature Granulocytes: 0.05 10*3/uL (ref 0.00–0.07)
Basophils Absolute: 0 10*3/uL (ref 0.0–0.1)
Basophils Relative: 1 %
Eosinophils Absolute: 0.1 10*3/uL (ref 0.0–0.5)
Eosinophils Relative: 2 %
HCT: 36.7 % — ABNORMAL LOW (ref 39.0–52.0)
Hemoglobin: 11 g/dL — ABNORMAL LOW (ref 13.0–17.0)
Immature Granulocytes: 1 %
Lymphocytes Relative: 7 %
Lymphs Abs: 0.6 10*3/uL — ABNORMAL LOW (ref 0.7–4.0)
MCH: 28.4 pg (ref 26.0–34.0)
MCHC: 30 g/dL (ref 30.0–36.0)
MCV: 94.8 fL (ref 80.0–100.0)
Monocytes Absolute: 0.4 10*3/uL (ref 0.1–1.0)
Monocytes Relative: 4 %
Neutro Abs: 7.1 10*3/uL (ref 1.7–7.7)
Neutrophils Relative %: 85 %
Platelets: 133 10*3/uL — ABNORMAL LOW (ref 150–400)
RBC: 3.87 MIL/uL — ABNORMAL LOW (ref 4.22–5.81)
RDW: 18.3 % — ABNORMAL HIGH (ref 11.5–15.5)
WBC: 8.3 10*3/uL (ref 4.0–10.5)
nRBC: 0 % (ref 0.0–0.2)

## 2021-05-18 LAB — BASIC METABOLIC PANEL
Anion gap: 12 (ref 5–15)
BUN: 81 mg/dL — ABNORMAL HIGH (ref 8–23)
CO2: 36 mmol/L — ABNORMAL HIGH (ref 22–32)
Calcium: 9.4 mg/dL (ref 8.9–10.3)
Chloride: 87 mmol/L — ABNORMAL LOW (ref 98–111)
Creatinine, Ser: 2.57 mg/dL — ABNORMAL HIGH (ref 0.61–1.24)
GFR, Estimated: 24 mL/min — ABNORMAL LOW (ref >60)
Glucose, Bld: 165 mg/dL — ABNORMAL HIGH (ref 70–99)
Potassium: 3.8 mmol/L (ref 3.5–5.1)
Sodium: 135 mmol/L (ref 135–145)

## 2021-05-18 MED ORDER — METOLAZONE 2.5 MG PO TABS
2.5000 mg | ORAL_TABLET | Freq: Every day | ORAL | Status: DC
  Administered 2021-05-18 – 2021-05-20 (×3): 2.5 mg via ORAL
  Filled 2021-05-18 (×3): qty 1

## 2021-05-18 NOTE — Progress Notes (Signed)
Progress Note  Patient Name: Scott [name] Date of Encounter: 05/18/2021  Primary Cardiologist: Peter Martinique, MD   Subjective   No events over night.  Patient notes he is still having LE edema and orthopnea.   Inpatient Medications    Scheduled Meds:  ALPRAZolam  0.25 mg Oral QHS   aspirin  81 mg Oral QHS   atorvastatin  20 mg Oral Daily   cholecalciferol  2,000 Units Oral Q M,W,F-2000   colchicine  0.3 mg Oral Daily   febuxostat  40 mg Oral Daily   ferrous sulfate  325 mg Oral Q M,W,F   insulin aspart  0-9 Units Subcutaneous TID WC   latanoprost  1 drop Both Eyes QHS   multivitamin with minerals  1 tablet Oral Daily   mupirocin ointment   Nasal BID   sodium chloride flush  3 mL Intravenous Q12H   tamsulosin  0.4 mg Oral QHS   Continuous Infusions:  sodium chloride     furosemide (LASIX) 200 mg in dextrose 5% 100 mL (2mg /mL) infusion 15 mg/hr (05/18/21 0751)   PRN Meds: sodium chloride, acetaminophen **OR** acetaminophen, docusate sodium, fluticasone, nitroGLYCERIN, oxyCODONE, sodium chloride flush   Vital Signs    Vitals:   05/18/21 0043 05/18/21 0334 05/18/21 0500 05/18/21 0809  BP:  (!) 138/94  100/67  Pulse:  94  94  Resp:  18    Temp:  98.5 F (36.9 C)    TempSrc:  Oral    SpO2:  100%    Weight: 90.9 kg  90.9 kg   Height:        Intake/Output Summary (Last 24 hours) at 05/18/2021 3329 Last data filed at 05/18/2021 0815 Gross per 24 hour  Intake 938.82 ml  Output 600 ml  Net 338.82 ml   Filed Weights   05/17/21 0136 05/18/21 0043 05/18/21 0500  Weight: 82.9 kg 90.9 kg 90.9 kg    Telemetry    SR w Mobitz I HB, no ectopy - Personally Reviewed  ECG    No new - Personally Reviewed  Physical Exam   GEN: No acute distress.   Neck: JVD to mid neck Cardiac: RRR, no murmurs, rubs, or gallops but with distant heart sounds Respiratory: Clear to auscultation bilaterally. GI: Soft, nontender, abdominal distention with hernia noted MS: R leg +2 edema,  +1 left leg; legs well wrapped. Neuro:  Nonfocal  Psych: Normal affect   Labs    Chemistry Recent Labs  Lab 05/16/21 0426 05/17/21 0312 05/18/21 0246  NA 135 136 135  K 3.6 3.9 3.8  CL 90* 90* 87*  CO2 36* 37* 36*  GLUCOSE 120* 139* 165*  BUN 76* 79* 81*  CREATININE 2.38* 2.44* 2.57*  CALCIUM 9.0 9.0 9.4  GFRNONAA 26* 25* 24*  ANIONGAP 9 9 12      Hematology Recent Labs  Lab 05/13/21 0228 05/16/21 0426 05/18/21 0246  WBC 7.0 6.0 8.3  RBC 3.65* 3.34* 3.87*  HGB 10.5* 9.5* 11.0*  HCT 34.9* 31.7* 36.7*  MCV 95.6 94.9 94.8  MCH 28.8 28.4 28.4  MCHC 30.1 30.0 30.0  RDW 17.7* 17.9* 18.3*  PLT 139* 94* 133*    Cardiac EnzymesNo results for input(s): TROPONINI in the last 168 hours. No results for input(s): TROPIPOC in the last 168 hours.   BNPNo results for input(s): BNP, PROBNP in the last 168 hours.   DDimer No results for input(s): DDIMER in the last 168 hours.   Radiology    No  results found.  Cardiac Studies   Transthoracic Echocardiogram: Date: 04/10/21 Results:  1. Left ventricular ejection fraction, by estimation, is 25 to 30%. The  left ventricle has severely decreased function. The left ventricle  demonstrates global hypokinesis. The left ventricular internal cavity size  was moderately dilated. Left  ventricular diastolic parameters are consistent with Grade II diastolic  dysfunction (pseudonormalization). Elevated left ventricular end-diastolic  pressure.   2. Right ventricular systolic function is severely reduced. The right  ventricular size is severely enlarged. There is severely elevated  pulmonary artery systolic pressure.   3. Left atrial size was severely dilated.   4. Right atrial size was moderately dilated.   5. The mitral valve is degenerative. Mild mitral valve regurgitation.   6. Tricuspid valve regurgitation is mild to moderate.   7. The aortic valve is tricuspid. There is mild calcification of the  aortic valve. There is mild  thickening of the aortic valve. Aortic valve  regurgitation is mild.   Left/Right Heart Catheterizations: Date: 04/10/21 Results: Prox RCA to Dist RCA lesion, 100 %stenosed. Prox LAD lesion, 100 %stenosed. LIMA graft was visualized by angiography and is large and anatomically normal. SVG graft was visualized by angiography and is normal in caliber. Origin to Prox Graft lesion, 0 %stenosed. SVG graft was visualized by angiography and is normal in caliber. Origin lesion, 30 %stenosed. Origin to Prox Graft lesion, 0 %stenosed. There is moderate to severe left ventricular systolic dysfunction. LV end diastolic pressure is normal.   1. Severe 2 vessel occlusive CAD.    - 100% proximal LAD    - 100% mid RCA 2. Patent LIMA to the LAD 3. Patent SVG to PDA 4. Patent SVG to PLOM 5. Moderate to severe LV dysfunction. EF estimated at 35%. 6. Normal LVEDP.   Patient Profile     87 y.o. male with HF, CAD and Kidney disease who presents in acute HF  Assessment & Plan     Heart Failure reduced Ejection Fraction  AKI ON CKD Stage IV HTN with DM OSA on CPAP Gout - NYHA class III, Stage C, hypervolemic, etiology from CAD on DDX - Diuretic regimen: Lasix drip 15 mg/HR will need high dose torsemide regimen at DC - all other GDMT has been held in the setting of his bradycardia or his AKI on CKD Stage IV (only on Coreg 12.5 mg PO BID prior - given ectopy; K goal 4 and Mg goal of 2 are important - Given his multi-organ disease, GOC and Palliative Care discussions would be reasonable for long term goal - 2.5 metolazone for today; if no further ectopy we will continue this  PAD CAD s/p CABG, Prior PCI HLD 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      Signed, Werner Lean, MD  05/18/2021, 9:38 AM

## 2021-05-18 NOTE — Progress Notes (Signed)
Pt had an episode of generalized tremors which he stated "I am cold", pt given multiple warm blankets and heat turned up in room but pt still continued to have episodes of tremors and shaking, VSS, no ECG changes noted by RN or reported by CCMD, pt BLE dsg changes completed per order. Pt resting comfortably in bed but keeps on removing his oxygen. Reported off to oncoming RN. Delia Heady RN

## 2021-05-18 NOTE — Progress Notes (Addendum)
Progress Note    Scott [name]  IBB:[id number] DOB: [birth year]  DOA: 05/04/2021 PCP: Scott Milan, MD      Brief Narrative:    Medical records reviewed and are as summarized below:  Scott [name] is a 87 y.o. male with medical history significant for chronic systolic and diastolic heart failure, CAD status post CABG stent, hypertension, CKD stage IV, HLD, GI bleed, chronic iron deficiency anemia, gout, thrombocytopenia, PAD, who presented to the hospital because of weight gain, lower extremity swelling and increasing shortness of breath.    Assessment/Plan:   Principal Problem:   Acute exacerbation of CHF (congestive heart failure) (HCC) Active Problems:   Hypertension   Dyslipidemia   Arteriosclerotic coronary artery disease   Diabetes mellitus, type II, insulin dependent (HCC)   OSA (obstructive sleep apnea)   Acute on chronic combined systolic and diastolic CHF (congestive heart failure) (HCC)   PAD (peripheral artery disease) (HCC)   CKD (chronic kidney disease) stage 4, GFR 15-29 ml/min (HCC)   Iron deficiency anemia   Thrombocytopenia (HCC)   Prolonged QT interval   Ascites    Body mass index is 27.18 kg/m.    Acute on chronic systolic and diastolic CHF, anasarca: Slowly improving.  Continue IV Lasix infusion.  Metolazone was restarted today.  Monitor BMP, daily weights and urine output. Follow-up with cardiologist.  2D echo showed EF estimated at 25 to 45%, grade 2 diastolic dysfunction.  Left foot ulcer: Appreciate recommendations from wound care nurse.  ABI showed moderate right lower extremity arterial disease but no significant left lower extremity arterial disease.  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  CKD stage IV: Creatinine is trending upward slowly but he's still in stage IV CKD.  Monitor BMP.  Possible liver cirrhosis by ultrasound.  Outpatient follow-up with  gastroenterologist.  Thrombocytopenia: This is chronic.  Platelet count is stable.  Debility: Home PT, OT and RN recommended.  Orders have been placed.  Other comorbidities include hypertension, dyslipidemia, type II DM, CAD, Gout, OSA on CPAP at night    Diet Order             Diet Heart Room service appropriate? Yes; Fluid consistency: Thin; Fluid restriction: 1200 mL Fluid  Diet effective now                      Consultants: Cardiologist  Procedures: Lower extremity extremity CO2 angiography on 05/11/2021    Medications:    ALPRAZolam  0.25 mg Oral QHS   aspirin  81 mg Oral QHS   atorvastatin  20 mg Oral Daily   cholecalciferol  2,000 Units Oral Q M,W,F-2000   colchicine  0.3 mg Oral Daily   febuxostat  40 mg Oral Daily   ferrous sulfate  325 mg Oral Q M,W,F   insulin aspart  0-9 Units Subcutaneous TID WC   latanoprost  1 drop Both Eyes QHS   metolazone  2.5 mg Oral Daily   multivitamin with minerals  1 tablet Oral Daily   mupirocin ointment   Nasal BID   sodium chloride flush  3 mL Intravenous Q12H   tamsulosin  0.4 mg Oral QHS   Continuous Infusions:  sodium chloride     furosemide (LASIX) 200 mg in dextrose 5% 100 mL (2mg /mL) infusion 15 mg/hr (05/18/21 0751)     Anti-infectives (From admission, onward)    None  Family Communication/Anticipated D/C date and plan/Code Status   DVT prophylaxis: SCDs Start: 05/05/21 1256     Code Status: DNR  Family Communication: None Disposition Plan:    Status is: Inpatient  Remains inpatient appropriate because:IV treatments appropriate due to intensity of illness or inability to take PO  Dispo: The patient is from: Home              Anticipated d/c is to: Home              Patient currently is not medically stable to d/c.   Difficult to place patient No           Subjective:   No acute events overnight. Leg swelling is slowly improving.  Breathing is a little  better but he's still short of breath with exertion.   Objective:    Vitals:   05/18/21 0334 05/18/21 0500 05/18/21 0809 05/18/21 1115  BP: (!) 138/94  100/67 (!) 92/55  Pulse: 94  94 75  Resp: 18   16  Temp: 98.5 F (36.9 C)   98 F (36.7 C)  TempSrc: Oral     SpO2: 100%   92%  Weight:  90.9 kg    Height:       No data found.   Intake/Output Summary (Last 24 hours) at 05/18/2021 1317 Last data filed at 05/18/2021 1121 Gross per 24 hour  Intake 938.82 ml  Output 725 ml  Net 213.82 ml   Filed Weights   05/17/21 0136 05/18/21 0043 05/18/21 0500  Weight: 82.9 kg 90.9 kg 90.9 kg    Exam:  GEN: NAD SKIN: No rash EYES: EOMI ENT: MMM CV: RRR PULM: Bibasilar rales. No wheezing ABD: soft, distended, abdominal hernia, NT, +BS CNS: AAO x 3, non focal EXT: B/l lower extremity edema from the thighs to the feet.  Edema is slowly improving.  No erythema or tenderness         Data Reviewed:   I have personally reviewed following labs and imaging studies:  Labs: Labs show the following:   Basic Metabolic Panel: Recent Labs  Lab 05/14/21 0139 05/15/21 0312 05/16/21 0426 05/17/21 0312 05/18/21 0246  NA 134* 136 135 136 135  K 3.9 3.8 3.6 3.9 3.8  CL 92* 92* 90* 90* 87*  CO2 32 36* 36* 37* 36*  GLUCOSE 138* 127* 120* 139* 165*  BUN 79* 76* 76* 79* 81*  CREATININE 2.41* 2.34* 2.38* 2.44* 2.57*  CALCIUM 8.9 8.9 9.0 9.0 9.4  MG  --  2.0 2.0  --   --    GFR Estimated Creatinine Clearance: 22.6 mL/min (A) (by C-G formula based on SCr of 2.57 mg/dL (H)). Liver Function Tests: No results for input(s): AST, ALT, ALKPHOS, BILITOT, PROT, ALBUMIN in the last 168 hours.  No results for input(s): LIPASE, AMYLASE in the last 168 hours. No results for input(s): AMMONIA in the last 168 hours. Coagulation profile No results for input(s): INR, PROTIME in the last 168 hours.   CBC: Recent Labs  Lab 05/12/21 0338 05/13/21 0228 05/16/21 0426 05/18/21 0246  WBC 5.3  7.0 6.0 8.3  NEUTROABS  --   --  3.4 7.1  HGB 9.6* 10.5* 9.5* 11.0*  HCT 31.5* 34.9* 31.7* 36.7*  MCV 96.0 95.6 94.9 94.8  PLT 122* 139* 94* 133*   Cardiac Enzymes: No results for input(s): CKTOTAL, CKMB, CKMBINDEX, TROPONINI in the last 168 hours. BNP (last 3 results) No results for input(s): PROBNP in the last 8760  hours. CBG: Recent Labs  Lab 05/17/21 1053 05/17/21 1612 05/17/21 2106 05/18/21 0602 05/18/21 1116  GLUCAP 177* 210* 199* 146* 177*   D-Dimer: No results for input(s): DDIMER in the last 72 hours. Hgb A1c: No results for input(s): HGBA1C in the last 72 hours. Lipid Profile: No results for input(s): CHOL, HDL, LDLCALC, TRIG, CHOLHDL, LDLDIRECT in the last 72 hours.  Thyroid function studies: No results for input(s): TSH, T4TOTAL, T3FREE, THYROIDAB in the last 72 hours.  Invalid input(s): FREET3 Anemia work up: No results for input(s): VITAMINB12, FOLATE, FERRITIN, TIBC, IRON, RETICCTPCT in the last 72 hours. Sepsis Labs: Recent Labs  Lab 05/12/21 0338 05/13/21 0228 05/16/21 0426 05/18/21 0246  WBC 5.3 7.0 6.0 8.3    Microbiology No results found for this or any previous visit (from the past 240 hour(s)).   Procedures and diagnostic studies:  No results found.             LOS: 13 days   [place] Copywriter, advertising on [email or web address]. If 7PM-7AM, please contact night-coverage at [email or web address]     05/18/2021, 1:17 PM

## 2021-05-18 NOTE — Progress Notes (Signed)
  Mobility Specialist Criteria Algorithm Info.  Mobility Team:  HOB elevated: Activity: Ambulated in room; Ambulated to bathroom; Dangled on edge of bed; Refused mobility (Declined hallway ambulation) Range of motion: Active; All extremities Level of assistance: Standby assist, set-up cues, supervision of patient - no hands on Assistive device: Front wheel walker Minutes sitting in chair:  Minutes stood: 3 minutes Minutes ambulated: 3 minutes Distance ambulated (ft): 30 ft Mobility response: Tolerated well Bed Position: Semi-fowlers  Patient declined hallway ambulation but agreeable to ambulate in room/restroom. Anticipating getting discharged soon. Ambulated in room 30 feet at supervision with slow steady gait. Tolerated ambulation well without complaint or incident. Was left dangling EOB with all needs met.   05/18/2021 10:27 AM

## 2021-05-18 NOTE — Plan of Care (Signed)
°  Problem: Clinical Measurements: °Goal: Ability to maintain clinical measurements within normal limits will improve °Outcome: Progressing °Goal: Diagnostic test results will improve °Outcome: Progressing °Goal: Cardiovascular complication will be avoided °Outcome: Progressing °  °

## 2021-05-18 NOTE — Progress Notes (Signed)
Physical Therapy Treatment Patient Details Name: Scott [name] MRN: [id number] DOB: [birth year] Today's Date: 05/18/2021    History of Present Illness Pt is an 87 y.o. male admitted 05/04/2021 with weight gain, LE edema and SOB. Workup for acute on chronic heart failure exacerbation. Pt also with L foot ulceration; s/p LLE angiography, aortogram 8/1. PMH includes chronic systolic heart failure, CAD s/p CABG, HTN, CKD stage IV, HLD, GIB, anemia, gout, thrombocytopenia, PAD.    PT Comments    Today's session continued to focus on mobility progression. Cues needed with tight space of room for rollator use/negotiation with no assistance or loss of balance noted. Acute PT to continue during pt's hospital stay.     Follow Up Recommendations  Home health PT;Supervision - Intermittent     Equipment Recommendations  None recommended by PT    Recommendations for Other Services       Precautions / Restrictions Precautions Precautions: Fall Precaution Comments: monitor SpO2 Restrictions Weight Bearing Restrictions: No    Mobility  Bed Mobility               General bed mobility comments: pt seated edge of bed before and after session    Transfers Overall transfer level: Needs assistance   Transfers: Sit to/from Stand Sit to Stand: Supervision         General transfer comment: pt checked to ensure brakes were locked prior to standing and then sitting back to bed after gait. use of rocking/momentum to achieve standing from bed in lowest position  Ambulation/Gait Ambulation/Gait assistance: Supervision Gait Distance (Feet): 30 Feet Assistive device: 4-wheeled walker Gait Pattern/deviations: Step-through pattern;Decreased stride length;Trunk flexed;Shuffle Gait velocity: Decreased   General Gait Details: agreeable to gait in room only this session. cues needed for navigation with rollator in tight spaces of room/around furniture with no balance loss noted. Pt with shuffled  steps, hoever also has slippers on that could be contributing to shuffled steps as well. Maintained 3 lpm via [place] with gait with VSS.          Cognition Arousal/Alertness: Awake/alert Behavior During Therapy: WFL for tasks assessed/performed;Flat affect Overall Cognitive Status: Within Functional Limits for tasks assessed                     Pertinent Vitals/Pain Pain Assessment: No/denies pain     PT Goals (current goals can now be found in the care plan section) Acute Rehab PT Goals Patient Stated Goal: to get stronger PT Goal Formulation: With patient Time For Goal Achievement: 05/21/21 Additional Goals Additional Goal #1: Pt will ambulate for >150' reporting a DOE of 3/10 or less to indicate improved tolerance for household mobility. Progress towards PT goals: Progressing toward goals    Frequency    Min 3X/week      PT Plan Current plan remains appropriate    AM-PAC PT "6 Clicks" Mobility   Outcome Measure  Help needed turning from your back to your side while in a flat bed without using bedrails?: None Help needed moving from lying on your back to sitting on the side of a flat bed without using bedrails?: None Help needed moving to and from a bed to a chair (including a wheelchair)?: None Help needed standing up from a chair using your arms (e.g., wheelchair or bedside chair)?: None Help needed to walk in hospital room?: A Little Help needed climbing 3-5 steps with a railing? : A Little 6 Click Score: 22    End of  Session Equipment Utilized During Treatment: Gait belt;Oxygen Activity Tolerance: Patient tolerated treatment well Patient left: in bed;with call bell/phone within reach;with bed alarm set (seated EOB) Nurse Communication: Mobility status PT Visit Diagnosis: Other abnormalities of gait and mobility (R26.89)     Time: 8168-3870 PT Time Calculation (min) (ACUTE ONLY): 12 min  Charges:  $Gait Training: 8-22 mins                     Scott [name], PTA, [place] Office- [phone] 05/18/21, 12:10 PM   Scott [name] 05/18/2021, 12:09 PM

## 2021-05-19 DIAGNOSIS — I5043 Acute on chronic combined systolic (congestive) and diastolic (congestive) heart failure: Secondary | ICD-10-CM | POA: Diagnosis not present

## 2021-05-19 DIAGNOSIS — N184 Chronic kidney disease, stage 4 (severe): Secondary | ICD-10-CM | POA: Diagnosis not present

## 2021-05-19 LAB — MAGNESIUM: Magnesium: 2.1 mg/dL (ref 1.7–2.4)

## 2021-05-19 LAB — GLUCOSE, CAPILLARY
Glucose-Capillary: 131 mg/dL — ABNORMAL HIGH (ref 70–99)
Glucose-Capillary: 158 mg/dL — ABNORMAL HIGH (ref 70–99)
Glucose-Capillary: 174 mg/dL — ABNORMAL HIGH (ref 70–99)
Glucose-Capillary: 201 mg/dL — ABNORMAL HIGH (ref 70–99)

## 2021-05-19 LAB — BASIC METABOLIC PANEL
Anion gap: 10 (ref 5–15)
BUN: 93 mg/dL — ABNORMAL HIGH (ref 8–23)
CO2: 35 mmol/L — ABNORMAL HIGH (ref 22–32)
Calcium: 8.7 mg/dL — ABNORMAL LOW (ref 8.9–10.3)
Chloride: 87 mmol/L — ABNORMAL LOW (ref 98–111)
Creatinine, Ser: 2.97 mg/dL — ABNORMAL HIGH (ref 0.61–1.24)
GFR, Estimated: 20 mL/min — ABNORMAL LOW (ref >60)
Glucose, Bld: 153 mg/dL — ABNORMAL HIGH (ref 70–99)
Potassium: 3.5 mmol/L (ref 3.5–5.1)
Sodium: 132 mmol/L — ABNORMAL LOW (ref 135–145)

## 2021-05-19 MED ORDER — TORSEMIDE 20 MG PO TABS
40.0000 mg | ORAL_TABLET | Freq: Two times a day (BID) | ORAL | Status: DC
  Filled 2021-05-19: qty 2

## 2021-05-19 NOTE — Progress Notes (Signed)
Physical Therapy Treatment Patient Details Name: Scott [name] MRN: [id number] DOB: [birth year] Today's Date: 05/19/2021    History of Present Illness Pt is an 87 y.o. male admitted 05/04/2021 with weight gain, LE edema and SOB. Workup for acute on chronic heart failure exacerbation. Pt also with L foot ulceration; s/p LLE angiography, aortogram 8/1. PMH includes chronic systolic heart failure, CAD s/p CABG, HTN, CKD stage IV, HLD, GIB, anemia, gout, thrombocytopenia, PAD.    PT Comments    Patient received sitting up on side of bed. He is agreeable to PT session. Patient performed sit to stand with supervision, ambulated 75 feet with RW, min guard. Slow steady pace. No lob. No difficulties reported other than foot pain. Patient will continue to benefit from skilled PT while here to improve strength and safety with mobility.     Follow Up Recommendations  Home health PT;Supervision - Intermittent     Equipment Recommendations  None recommended by PT    Recommendations for Other Services       Precautions / Restrictions Precautions Precautions: Fall Restrictions Weight Bearing Restrictions: No    Mobility  Bed Mobility               General bed mobility comments: pt seated edge of bed before and after session    Transfers Overall transfer level: Needs assistance Equipment used: Rolling walker (2 wheeled) Transfers: Sit to/from Stand Sit to Stand: Supervision            Ambulation/Gait Ambulation/Gait assistance: Min guard;Supervision Gait Distance (Feet): 75 Feet   Gait Pattern/deviations: Step-through pattern;Decreased step length - right;Decreased step length - left;Shuffle;Trunk flexed Gait velocity: Decreased   General Gait Details: patient ambulates with shuffle steps, slow pace. Steady with mobility   Stairs             Wheelchair Mobility    Modified Rankin (Stroke Patients Only)       Balance Overall balance assessment: Modified  Independent Sitting-balance support: Feet supported Sitting balance-Leahy Scale: Normal     Standing balance support: Bilateral upper extremity supported;During functional activity Standing balance-Leahy Scale: Good Standing balance comment: uses B UE support with ambulation.                            Cognition Arousal/Alertness: Awake/alert Behavior During Therapy: WFL for tasks assessed/performed Overall Cognitive Status: Within Functional Limits for tasks assessed                                        Exercises      General Comments        Pertinent Vitals/Pain Faces Pain Scale: Hurts a little bit Pain Location: B feet Pain Descriptors / Indicators: Sore;Discomfort Pain Intervention(s): Monitored during session    Home Living                      Prior Function            PT Goals (current goals can now be found in the care plan section) Acute Rehab PT Goals Patient Stated Goal: to get stronger PT Goal Formulation: With patient Time For Goal Achievement: 05/21/21 Potential to Achieve Goals: Good Additional Goals Additional Goal #1: Pt will ambulate for >150' reporting a DOE of 3/10 or less to indicate improved tolerance for household mobility. Progress towards PT goals:  Progressing toward goals    Frequency    Min 3X/week      PT Plan Current plan remains appropriate    Co-evaluation              AM-PAC PT "6 Clicks" Mobility   Outcome Measure  Help needed turning from your back to your side while in a flat bed without using bedrails?: None Help needed moving from lying on your back to sitting on the side of a flat bed without using bedrails?: None Help needed moving to and from a bed to a chair (including a wheelchair)?: None Help needed standing up from a chair using your arms (e.g., wheelchair or bedside chair)?: None Help needed to walk in hospital room?: A Little Help needed climbing 3-5 steps with a  railing? : A Little 6 Click Score: 22    End of Session Equipment Utilized During Treatment: Gait belt Activity Tolerance: Patient tolerated treatment well Patient left: in bed;with call bell/phone within reach;with bed alarm set Nurse Communication: Mobility status PT Visit Diagnosis: Difficulty in walking, not elsewhere classified (R26.2)     Time: 1120-1140 PT Time Calculation (min) (ACUTE ONLY): 20 min  Charges:  $Gait Training: 8-22 mins                     Venus Gilles, PT, GCS 05/19/21,11:52 AM

## 2021-05-19 NOTE — Plan of Care (Signed)

## 2021-05-19 NOTE — Progress Notes (Addendum)
Progress Note    Scott [name]  ZOX:[id number] DOB: 01-13-1933  DOA: 05/04/2021 PCP: Reubin Milan, MD      Brief Narrative:    Medical records reviewed and are as summarized below:  Scott [name] is a 87 y.o. male with medical history significant for chronic systolic and diastolic heart failure, CAD status post CABG stent, hypertension, CKD stage IV, HLD, GI bleed, chronic iron deficiency anemia, gout, thrombocytopenia, PAD, who presented to the hospital because of weight gain, lower extremity swelling and increasing shortness of breath.  He was admitted to the hospital for acute exacerbation of chronic combined systolic and diastolic CHF.  He was treated with IV Lasix infusion and oral metolazone.  Cardiologist was consulted to assist with management.    Assessment/Plan:   Principal Problem:   Acute exacerbation of CHF (congestive heart failure) (HCC) Active Problems:   Hypertension   Dyslipidemia   Arteriosclerotic coronary artery disease   Diabetes mellitus, type II, insulin dependent (HCC)   OSA (obstructive sleep apnea)   Acute on chronic combined systolic and diastolic CHF (congestive heart failure) (HCC)   PAD (peripheral artery disease) (HCC)   CKD (chronic kidney disease) stage 4, GFR 15-29 ml/min (HCC)   Iron deficiency anemia   Thrombocytopenia (HCC)   Prolonged QT interval   Ascites    Body mass index is 27.14 kg/m.    Acute on chronic systolic and diastolic CHF, anasarca: IV Lasix infusion has been held because of increasing creatinine.  Plan to start oral torsemide tomorrow if creatinine is stable.  Monitor BMP, daily weight and urine output.  2D echo showed EF estimated at 25 to 81%, grade 2 diastolic dysfunction.  Left foot ulcer: Appreciate recommendations from wound care nurse.  ABI showed moderate right lower extremity arterial disease but no significant left lower extremity arterial disease.  Ectatic aorta and bilateral common iliac arteries  and lower extremity CO2 angiography on 05/11/2021.  AKI on CKD stage IV: Creatinine continues to increase.  IV Lasix has been held.  Repeat creatinine tomorrow.  Discussed with cardiologist, Dr. Gasper Sells  Possible liver cirrhosis by ultrasound.  Outpatient follow-up with gastroenterologist.  Thrombocytopenia: This is chronic.  Platelet count is stable.  Debility: Home PT, OT and RN recommended.  Orders have been placed.  Other comorbidities include hypertension, dyslipidemia, type II DM, CAD, Gout, OSA on CPAP at night  Consult palliative care to establish goals of care because of advanced age and multiple comorbidities  Diet Order             Diet Heart Room service appropriate? Yes; Fluid consistency: Thin; Fluid restriction: 1200 mL Fluid  Diet effective now                      Consultants: Cardiologist  Procedures: Lower extremity extremity CO2 angiography on 05/11/2021    Medications:    ALPRAZolam  0.25 mg Oral QHS   aspirin  81 mg Oral QHS   atorvastatin  20 mg Oral Daily   cholecalciferol  2,000 Units Oral Q M,W,F-2000   colchicine  0.3 mg Oral Daily   febuxostat  40 mg Oral Daily   ferrous sulfate  325 mg Oral Q M,W,F   insulin aspart  0-9 Units Subcutaneous TID WC   latanoprost  1 drop Both Eyes QHS   metolazone  2.5 mg Oral Daily   multivitamin with minerals  1 tablet Oral Daily   mupirocin ointment   Nasal  BID   sodium chloride flush  3 mL Intravenous Q12H   tamsulosin  0.4 mg Oral QHS   [START ON 05/20/2021] torsemide  40 mg Oral BID   Continuous Infusions:  sodium chloride       Anti-infectives (From admission, onward)    None              Family Communication/Anticipated D/C date and plan/Code Status   DVT prophylaxis: SCDs Start: 05/05/21 1256     Code Status: DNR  Family Communication: None Disposition Plan:    Status is: Inpatient  Remains inpatient appropriate because:IV treatments appropriate due to intensity  of illness or inability to take PO  Dispo: The patient is from: Home              Anticipated d/c is to: Home              Patient currently is not medically stable to d/c.   Difficult to place patient No           Subjective:   C/o exertional shortness of breath.  No chest pain.  He still has swelling in the legs.  Objective:    Vitals:   05/18/21 2300 05/19/21 0552 05/19/21 0849 05/19/21 1106  BP:  (!) 118/93 (!) 93/58 117/80  Pulse: 60 76 62 (!) 41  Resp: 18 20  18   Temp:  98.2 F (36.8 C)  98 F (36.7 C)  TempSrc:  Oral    SpO2: 98% 95%  95%  Weight:  90.8 kg    Height:       No data found.   Intake/Output Summary (Last 24 hours) at 05/19/2021 1153 Last data filed at 05/19/2021 1124 Gross per 24 hour  Intake 1126.97 ml  Output 550 ml  Net 576.97 ml   Filed Weights   05/18/21 0043 05/18/21 0500 05/19/21 0552  Weight: 90.9 kg 90.9 kg 90.8 kg    Exam:  GEN: NAD SKIN: Warm and dry EYES: No pallor or icterus ENT: MMM CV: RRR PULM: Bibasilar rales.  No wheezing heard. ABD: soft, distended, abdominal hernia,, NT, +BS CNS: AAO x 3, non focal EXT: No bilateral lower extremity edema from the thighs to the feet (2+) no erythema or tenderness.         Data Reviewed:   I have personally reviewed following labs and imaging studies:  Labs: Labs show the following:   Basic Metabolic Panel: Recent Labs  Lab 05/15/21 0312 05/16/21 0426 05/17/21 0312 05/18/21 0246 05/19/21 0218  NA 136 135 136 135 132*  K 3.8 3.6 3.9 3.8 3.5  CL 92* 90* 90* 87* 87*  CO2 36* 36* 37* 36* 35*  GLUCOSE 127* 120* 139* 165* 153*  BUN 76* 76* 79* 81* 93*  CREATININE 2.34* 2.38* 2.44* 2.57* 2.97*  CALCIUM 8.9 9.0 9.0 9.4 8.7*  MG 2.0 2.0  --   --  2.1   GFR Estimated Creatinine Clearance: 19.6 mL/min (A) (by C-G formula based on SCr of 2.97 mg/dL (H)). Liver Function Tests: No results for input(s): AST, ALT, ALKPHOS, BILITOT, PROT, ALBUMIN in the last 168  hours.  No results for input(s): LIPASE, AMYLASE in the last 168 hours. No results for input(s): AMMONIA in the last 168 hours. Coagulation profile No results for input(s): INR, PROTIME in the last 168 hours.   CBC: Recent Labs  Lab 05/13/21 0228 05/16/21 0426 05/18/21 0246  WBC 7.0 6.0 8.3  NEUTROABS  --  3.4 7.1  HGB  10.5* 9.5* 11.0*  HCT 34.9* 31.7* 36.7*  MCV 95.6 94.9 94.8  PLT 139* 94* 133*   Cardiac Enzymes: No results for input(s): CKTOTAL, CKMB, CKMBINDEX, TROPONINI in the last 168 hours. BNP (last 3 results) No results for input(s): PROBNP in the last 8760 hours. CBG: Recent Labs  Lab 05/18/21 1116 05/18/21 1627 05/18/21 2106 05/19/21 0559 05/19/21 1107  GLUCAP 177* 206* 227* 131* 158*   D-Dimer: No results for input(s): DDIMER in the last 72 hours. Hgb A1c: No results for input(s): HGBA1C in the last 72 hours. Lipid Profile: No results for input(s): CHOL, HDL, LDLCALC, TRIG, CHOLHDL, LDLDIRECT in the last 72 hours.  Thyroid function studies: No results for input(s): TSH, T4TOTAL, T3FREE, THYROIDAB in the last 72 hours.  Invalid input(s): FREET3 Anemia work up: No results for input(s): VITAMINB12, FOLATE, FERRITIN, TIBC, IRON, RETICCTPCT in the last 72 hours. Sepsis Labs: Recent Labs  Lab 05/13/21 0228 05/16/21 0426 05/18/21 0246  WBC 7.0 6.0 8.3    Microbiology No results found for this or any previous visit (from the past 240 hour(s)).   Procedures and diagnostic studies:  No results found.             LOS: 14 days   [place] Copywriter, advertising on [email or web address]. If 7PM-7AM, please contact night-coverage at [email or web address]     05/19/2021, 11:53 AM

## 2021-05-19 NOTE — Progress Notes (Signed)
Progress Note  Patient Name: Scott [name] Date of Encounter: 05/19/2021  Primary Cardiologist: Peter Martinique, MD   Subjective   Over 5.5 L negative this admission.  Patient notes that he feels better from when he came in.  Notes that there is no long term plan for dialysis or in regard to his CKD stage IV  Inpatient Medications    Scheduled Meds:  ALPRAZolam  0.25 mg Oral QHS   aspirin  81 mg Oral QHS   atorvastatin  20 mg Oral Daily   cholecalciferol  2,000 Units Oral Q M,W,F-2000   colchicine  0.3 mg Oral Daily   febuxostat  40 mg Oral Daily   ferrous sulfate  325 mg Oral Q M,W,F   insulin aspart  0-9 Units Subcutaneous TID WC   latanoprost  1 drop Both Eyes QHS   metolazone  2.5 mg Oral Daily   multivitamin with minerals  1 tablet Oral Daily   mupirocin ointment   Nasal BID   sodium chloride flush  3 mL Intravenous Q12H   tamsulosin  0.4 mg Oral QHS   Continuous Infusions:  sodium chloride     furosemide (LASIX) 200 mg in dextrose 5% 100 mL (2mg /mL) infusion 15 mg/hr (05/19/21 0004)   PRN Meds: sodium chloride, acetaminophen **OR** acetaminophen, docusate sodium, fluticasone, nitroGLYCERIN, oxyCODONE, sodium chloride flush   Vital Signs    Vitals:   05/18/21 1954 05/18/21 2300 05/19/21 0552 05/19/21 0849  BP: 113/60  (!) 118/93 (!) 93/58  Pulse: 61 60 76 62  Resp: 17 18 20    Temp: (!) 97.4 F (36.3 C)  98.2 F (36.8 C)   TempSrc: Oral  Oral   SpO2: 100% 98% 95%   Weight:   90.8 kg   Height:        Intake/Output Summary (Last 24 hours) at 05/19/2021 1024 Last data filed at 05/19/2021 0910 Gross per 24 hour  Intake 1126.97 ml  Output 475 ml  Net 651.97 ml   Filed Weights   05/18/21 0043 05/18/21 0500 05/19/21 0552  Weight: 90.9 kg 90.9 kg 90.8 kg    Telemetry    One four beat run on NSVT - Personally Reviewed  ECG    No new - Personally Reviewed  Physical Exam   GEN: No acute distress.   Neck: JVD to lower third of neck but improved Cardiac:  RRR with  Respiratory: Clear to auscultation bilaterally. GI: Soft, nontender, abdominal distention with hernia noted MS: R leg +2 edema, +1 left leg; legs well wrapped. Neuro:  Nonfocal  Psych: Normal affect   Labs    Chemistry Recent Labs  Lab 05/17/21 0312 05/18/21 0246 05/19/21 0218  NA 136 135 132*  K 3.9 3.8 3.5  CL 90* 87* 87*  CO2 37* 36* 35*  GLUCOSE 139* 165* 153*  BUN 79* 81* 93*  CREATININE 2.44* 2.57* 2.97*  CALCIUM 9.0 9.4 8.7*  GFRNONAA 25* 24* 20*  ANIONGAP 9 12 10      Hematology Recent Labs  Lab 05/13/21 0228 05/16/21 0426 05/18/21 0246  WBC 7.0 6.0 8.3  RBC 3.65* 3.34* 3.87*  HGB 10.5* 9.5* 11.0*  HCT 34.9* 31.7* 36.7*  MCV 95.6 94.9 94.8  MCH 28.8 28.4 28.4  MCHC 30.1 30.0 30.0  RDW 17.7* 17.9* 18.3*  PLT 139* 94* 133*    Cardiac EnzymesNo results for input(s): TROPONINI in the last 168 hours. No results for input(s): TROPIPOC in the last 168 hours.   BNPNo results for  input(s): BNP, PROBNP in the last 168 hours.   DDimer No results for input(s): DDIMER in the last 168 hours.   Radiology    No results found.  Cardiac Studies   Transthoracic Echocardiogram: Date: 04/10/21 Results:  1. Left ventricular ejection fraction, by estimation, is 25 to 30%. The  left ventricle has severely decreased function. The left ventricle  demonstrates global hypokinesis. The left ventricular internal cavity size  was moderately dilated. Left  ventricular diastolic parameters are consistent with Grade II diastolic  dysfunction (pseudonormalization). Elevated left ventricular end-diastolic  pressure.   2. Right ventricular systolic function is severely reduced. The right  ventricular size is severely enlarged. There is severely elevated  pulmonary artery systolic pressure.   3. Left atrial size was severely dilated.   4. Right atrial size was moderately dilated.   5. The mitral valve is degenerative. Mild mitral valve regurgitation.   6. Tricuspid  valve regurgitation is mild to moderate.   7. The aortic valve is tricuspid. There is mild calcification of the  aortic valve. There is mild thickening of the aortic valve. Aortic valve  regurgitation is mild.   Left/Right Heart Catheterizations: Date: 04/10/21 Results: Prox RCA to Dist RCA lesion, 100 %stenosed. Prox LAD lesion, 100 %stenosed. LIMA graft was visualized by angiography and is large and anatomically normal. SVG graft was visualized by angiography and is normal in caliber. Origin to Prox Graft lesion, 0 %stenosed. SVG graft was visualized by angiography and is normal in caliber. Origin lesion, 30 %stenosed. Origin to Prox Graft lesion, 0 %stenosed. There is moderate to severe left ventricular systolic dysfunction. LV end diastolic pressure is normal.   1. Severe 2 vessel occlusive CAD.    - 100% proximal LAD    - 100% mid RCA 2. Patent LIMA to the LAD 3. Patent SVG to PDA 4. Patent SVG to PLOM 5. Moderate to severe LV dysfunction. EF estimated at 35%. 6. Normal LVEDP.   Patient Profile     87 y.o. male with HF, CAD and Kidney disease who presents in acute HF  Assessment & Plan    Heart Failure reduced Ejection Fraction  AKI ON CKD Stage IV HTN with DM OSA on CPAP Gout - NYHA class III, Stage C, hypervolemic, etiology from CAD on DDX - Diuretic regimen: Will DC lasix drip and plan on 40 mg Torsemide short term (this may need to be increased as outpatient) - will need GOC discussion as to the long term plans for his kidney function; we are not further diuresing him with IV medication because of concerns for worsening AKI and need HD - all other GDMT has been held in the setting of his bradycardia, and hypotension and his AKI on CKD Stage IV (only on Coreg 12.5 mg PO BID prior as outpatient given low BP)  PAD CAD s/p CABG, Prior PCI HLD Mobitz Type HB and bradycardia and RBBB (Bifasicular block) Hx of GI Bleed and chronic IDA Thrombocytopenia (improving) -  continue ASA and statin - no plans for PAD intervention; VVS seen during this admission with CO2 angiography  Discussed with patient and Primary MD  Potential DC either 05/19/21 or 05/20/21 to see kidney rebound We will work on follow up at this time  For questions or updates, please contact [facility] Please consult [email or web address] for contact info under Cardiology/STEMI.      Signed, Werner Lean, MD  05/19/2021, 10:24 AM

## 2021-05-19 NOTE — Progress Notes (Signed)
   05/19/21 0931  Mobility  Activity Refused mobility (Pt declined for unspecified reasons)

## 2021-05-19 NOTE — Progress Notes (Signed)
Heart Failure Navigator Progress Note  Assessed for Heart & Vascular TOC clinic readiness.  Unfortunately at this time the patient does not meet criteria due to AKI on CKD IV (CrCl <20).   Navigator available for reassessment of patient.   Kerby Nora, PharmD, BCPS Heart Failure Stewardship Pharmacist Phone [phone]

## 2021-05-20 DIAGNOSIS — I5043 Acute on chronic combined systolic (congestive) and diastolic (congestive) heart failure: Secondary | ICD-10-CM | POA: Diagnosis not present

## 2021-05-20 DIAGNOSIS — E119 Type 2 diabetes mellitus without complications: Secondary | ICD-10-CM

## 2021-05-20 DIAGNOSIS — Z794 Long term (current) use of insulin: Secondary | ICD-10-CM

## 2021-05-20 LAB — RENAL FUNCTION PANEL
Albumin: 2.6 g/dL — ABNORMAL LOW (ref 3.5–5.0)
Anion gap: 11 (ref 5–15)
BUN: 100 mg/dL — ABNORMAL HIGH (ref 8–23)
CO2: 34 mmol/L — ABNORMAL HIGH (ref 22–32)
Calcium: 8.7 mg/dL — ABNORMAL LOW (ref 8.9–10.3)
Chloride: 86 mmol/L — ABNORMAL LOW (ref 98–111)
Creatinine, Ser: 2.93 mg/dL — ABNORMAL HIGH (ref 0.61–1.24)
GFR, Estimated: 20 mL/min — ABNORMAL LOW (ref >60)
Glucose, Bld: 158 mg/dL — ABNORMAL HIGH (ref 70–99)
Phosphorus: 3 mg/dL (ref 2.5–4.6)
Potassium: 3.9 mmol/L (ref 3.5–5.1)
Sodium: 131 mmol/L — ABNORMAL LOW (ref 135–145)

## 2021-05-20 LAB — GLUCOSE, CAPILLARY
Glucose-Capillary: 143 mg/dL — ABNORMAL HIGH (ref 70–99)
Glucose-Capillary: 188 mg/dL — ABNORMAL HIGH (ref 70–99)

## 2021-05-20 MED ORDER — TORSEMIDE 20 MG PO TABS: 20.0000 mg | ORAL_TABLET | Freq: Two times a day (BID) | ORAL | 0 refills | Status: AC

## 2021-05-20 MED ORDER — TORSEMIDE 20 MG PO TABS: 20.0000 mg | ORAL_TABLET | Freq: Two times a day (BID) | ORAL | Status: DC

## 2021-05-20 MED ORDER — METOLAZONE 2.5 MG PO TABS: 2.5000 mg | ORAL_TABLET | Freq: Every day | ORAL | 0 refills | Status: AC

## 2021-05-20 NOTE — Progress Notes (Signed)
Progress Note  Patient Name: Scott [name] Date of Encounter: 05/20/2021  Primary Cardiologist: Peter Martinique, MD   Subjective   No events overnight.  Patient feels well and is off O2.  Patient notes that [facility] and isn't sure he would want it if he needed it.  Inpatient Medications    Scheduled Meds:  ALPRAZolam  0.25 mg Oral QHS   aspirin  81 mg Oral QHS   atorvastatin  20 mg Oral Daily   cholecalciferol  2,000 Units Oral Q M,W,F-2000   colchicine  0.3 mg Oral Daily   febuxostat  40 mg Oral Daily   ferrous sulfate  325 mg Oral Q M,W,F   insulin aspart  0-9 Units Subcutaneous TID WC   latanoprost  1 drop Both Eyes QHS   metolazone  2.5 mg Oral Daily   multivitamin with minerals  1 tablet Oral Daily   mupirocin ointment   Nasal BID   sodium chloride flush  3 mL Intravenous Q12H   tamsulosin  0.4 mg Oral QHS   torsemide  20 mg Oral BID   Continuous Infusions:  sodium chloride     PRN Meds: sodium chloride, acetaminophen **OR** acetaminophen, docusate sodium, fluticasone, nitroGLYCERIN, oxyCODONE, sodium chloride flush   Vital Signs    Vitals:   05/19/21 2346 05/20/21 0255 05/20/21 0323 05/20/21 1033  BP:   110/68 110/66  Pulse: 76  62 62  Resp: 14  18   Temp:   97.6 F (36.4 C) 98 F (36.7 C)  TempSrc:   Oral Oral  SpO2: 96%  100% 97%  Weight:  91.5 kg    Height:        Intake/Output Summary (Last 24 hours) at 05/20/2021 1106 Last data filed at 05/20/2021 1056 Gross per 24 hour  Intake 840 ml  Output 975 ml  Net -135 ml   Filed Weights   05/18/21 0500 05/19/21 0552 05/20/21 0255  Weight: 90.9 kg 90.8 kg 91.5 kg    Telemetry    One four beat run on NSVT (10 beats) - Personally Reviewed  ECG    No new - Personally Reviewed  Physical Exam   GEN: No acute distress.   Neck: No JVD Cardiac: RRR with no murmurs rubs or gallobs Respiratory: Clear to auscultation bilaterally. GI: Soft, nontender, abdominal distention with  hernia noted MS: R leg +1 edema, +1 left leg; legs well wrapped. Neuro:  Nonfocal  Psych: Normal affect   Labs    Chemistry Recent Labs  Lab 05/18/21 0246 05/19/21 0218 05/20/21 0300  NA 135 132* 131*  K 3.8 3.5 3.9  CL 87* 87* 86*  CO2 36* 35* 34*  GLUCOSE 165* 153* 158*  BUN 81* 93* 100*  CREATININE 2.57* 2.97* 2.93*  CALCIUM 9.4 8.7* 8.7*  ALBUMIN  --   --  2.6*  GFRNONAA 24* 20* 20*  ANIONGAP 12 10 11      Hematology Recent Labs  Lab 05/16/21 0426 05/18/21 0246  WBC 6.0 8.3  RBC 3.34* 3.87*  HGB 9.5* 11.0*  HCT 31.7* 36.7*  MCV 94.9 94.8  MCH 28.4 28.4  MCHC 30.0 30.0  RDW 17.9* 18.3*  PLT 94* 133*    Cardiac EnzymesNo results for input(s): TROPONINI in the last 168 hours. No results for input(s): TROPIPOC in the last 168 hours.   BNPNo results for input(s): BNP, PROBNP in the last 168 hours.   DDimer No results for input(s): DDIMER  in the last 168 hours.   Radiology    No results found.  Cardiac Studies   Transthoracic Echocardiogram: Date: 04/10/21 Results:  1. Left ventricular ejection fraction, by estimation, is 25 to 30%. The  left ventricle has severely decreased function. The left ventricle  demonstrates global hypokinesis. The left ventricular internal cavity size  was moderately dilated. Left  ventricular diastolic parameters are consistent with Grade II diastolic  dysfunction (pseudonormalization). Elevated left ventricular end-diastolic  pressure.   2. Right ventricular systolic function is severely reduced. The right  ventricular size is severely enlarged. There is severely elevated  pulmonary artery systolic pressure.   3. Left atrial size was severely dilated.   4. Right atrial size was moderately dilated.   5. The mitral valve is degenerative. Mild mitral valve regurgitation.   6. Tricuspid valve regurgitation is mild to moderate.   7. The aortic valve is tricuspid. There is mild calcification of the  aortic valve. There is mild  thickening of the aortic valve. Aortic valve  regurgitation is mild.   Left/Right Heart Catheterizations: Date: 04/10/21 Results: Prox RCA to Dist RCA lesion, 100 %stenosed. Prox LAD lesion, 100 %stenosed. LIMA graft was visualized by angiography and is large and anatomically normal. SVG graft was visualized by angiography and is normal in caliber. Origin to Prox Graft lesion, 0 %stenosed. SVG graft was visualized by angiography and is normal in caliber. Origin lesion, 30 %stenosed. Origin to Prox Graft lesion, 0 %stenosed. There is moderate to severe left ventricular systolic dysfunction. LV end diastolic pressure is normal.   1. Severe 2 vessel occlusive CAD.    - 100% proximal LAD    - 100% mid RCA 2. Patent LIMA to the LAD 3. Patent SVG to PDA 4. Patent SVG to PLOM 5. Moderate to severe LV dysfunction. EF estimated at 35%. 6. Normal LVEDP.   Patient Profile     87 y.o. male with HF, CAD and Kidney disease who presents in acute HF  Assessment & Plan    Heart Failure reduced Ejection Fraction  AKI ON CKD Stage IV HTN with DM OSA on CPAP Gout - NYHA class III, Stage C, hypervolemic, etiology from CAD on DDX - Diuretic regimen: no improvement in kidney fx; will decrease to torsemide 20 mg PO BID - patient will need early follow up with [facility] for safe discharge given that we have had to back off on his diuresis for his kidney; consider GOD discussion if appropriate - high risk of readmission - all other GDMT has been held in the setting of his bradycardia, and hypotension and his AKI on CKD Stage IV (only on Coreg 12.5 mg PO BID prior as outpatient given low BP)  PAD CAD s/p CABG, Prior PCI HLD Mobitz Type HB and bradycardia and RBBB (Bifasicular block) Hx of GI Bleed and chronic IDA Thrombocytopenia (improving) - continue ASA and statin - no plans for PAD intervention; VVS seen during this admission with CO2 angiography   For questions or updates, please  contact [facility] Please consult [email or web address] for contact info under Cardiology/STEMI.      Signed, Werner Lean, MD  05/20/2021, 11:06 AM

## 2021-05-20 NOTE — TOC Transition Note (Signed)
Transition of Care [facility]) - CM/SW Discharge Note   Patient Details  Name: Scott [name] MRN: [id number] Date of Birth: 1933-01-14  Transition of Care [facility]) CM/SW Contact:  Zenon Mayo, RN Phone Number: 05/20/2021, 4:31 PM   Clinical Narrative:    Patient is for dc to day, NCM notified Lattie Haw with [facility].     Final next level of care: [place] Barriers to Discharge: No Barriers Identified   Patient Goals and CMS Choice Patient states their goals for this hospitalization and ongoing recovery are:: return home CMS Medicare.gov Compare Post Acute Care list provided to:: Patient Choice offered to / list presented to : Patient  Discharge Placement                       Discharge Plan and Services   Discharge Planning Services: CM Consult              DME Agency: NA       HH Arranged: RN, PT, OT [facility] Agency: [facility] Date [facility] Agency Contacted: 05/07/21 Time [place]: 1722 Representative spoke with at [place]: [place] ([place]) Interventions     Readmission Risk Interventions Readmission Risk Prevention Plan 05/20/2021 05/07/2021 04/14/2021  Transportation Screening Complete Complete Complete  PCP or Specialist Appt within 5-7 Days - - -  PCP or Specialist Appt within 3-5 Days - - Complete  Home Care Screening - - -  Medication Review (RN CM) - - -  [place] or [place] - - Complete  Social Work Consult for [place] Planning/Counseling - - Complete  Palliative Care Screening - - Not Applicable  Medication Review Press photographer) Complete Complete Complete  PCP or Specialist appointment within 3-5 days of discharge Complete - -  [place] or Home Care Consult Complete Complete -  SW Recovery Care/Counseling Consult Complete Complete -  Palliative Care Screening Not Applicable Not Applicable -  [place] Complete Not Applicable -  Some recent data might be hidden

## 2021-05-20 NOTE — Progress Notes (Addendum)
Occupational Therapy Treatment Patient Details Name: Scott [name] MRN: [id number] DOB: [birth year] Today's Date: 05/20/2021    History of present illness Pt is an 87 y.o. male admitted 05/04/2021 with weight gain, LE edema and SOB. Workup for acute on chronic heart failure exacerbation. Pt also with L foot ulceration; s/p LLE angiography, aortogram 8/1. PMH includes chronic systolic heart failure, CAD s/p CABG, HTN, CKD stage IV, HLD, GIB, anemia, gout, thrombocytopenia, PAD.   OT comments  Pt progressing well, completing transfers and mobility with RW with supervision. Grooming standing at sink with supervision (no rest breaks required between grooming and hallway ambulation).  On RA with VSS. Reviewed energy conservation and fall prevention techniques.  Will follow acutely and continue to recommend [facility] services at dc.    Follow Up Recommendations  Home health OT;Supervision - Intermittent    Equipment Recommendations  None recommended by OT    Recommendations for Other Services      Precautions / Restrictions Precautions Precautions: Fall Restrictions Weight Bearing Restrictions: No       Mobility Bed Mobility               General bed mobility comments: EOB upon entry    Transfers Overall transfer level: Needs assistance Equipment used: Rolling walker (2 wheeled) Transfers: Sit to/from Stand Sit to Stand: Supervision         General transfer comment: for safety, cueing for hand placement    Balance Overall balance assessment: Mild deficits observed, not formally tested                                         ADL either performed or assessed with clinical judgement   ADL Overall ADL's : Needs assistance/impaired     Grooming: Brushing hair;Oral care;Supervision/safety;Standing               Lower Body Dressing: Minimal assistance;Sit to/from stand Lower Body Dressing Details (indicate cue type and reason): to fully don slippers,  supervision sit to stand Toilet Transfer: Supervision/safety;Ambulation;RW Toilet Transfer Details (indicate cue type and reason): simulated in room         Functional mobility during ADLs: Supervision/safety;Rolling walker       Vision       Perception     Praxis      Cognition Arousal/Alertness: Awake/alert Behavior During Therapy: WFL for tasks assessed/performed Overall Cognitive Status: Within Functional Limits for tasks assessed                                          Exercises     Shoulder Instructions       General Comments SpO2 94% on RA during mobiltiy    Pertinent Vitals/ Pain       Pain Assessment: 0-10 Pain Score: 5  Pain Location: B feet Pain Descriptors / Indicators: Sore;Discomfort Pain Intervention(s): Monitored during session  Home Living                                          Prior Functioning/Environment              Frequency  Min 2X/week        Progress Toward  Goals  OT Goals(current goals can now be found in the care plan section)  Progress towards OT goals: Progressing toward goals  Acute Rehab OT Goals Patient Stated Goal: home OT Goal Formulation: With patient Time For Goal Achievement: 06/04/21 Potential to Achieve Goals: Good ADL Goals Pt Will Perform Grooming: with modified independence;standing Pt Will Perform Lower Body Bathing: with modified independence;sit to/from stand;with adaptive equipment Pt Will Perform Lower Body Dressing: with modified independence;sit to/from stand;with adaptive equipment Pt Will Transfer to Toilet: with modified independence;ambulating Pt Will Perform Tub/Shower Transfer: Tub transfer;with set-up;ambulating;shower seat  Plan Discharge plan remains appropriate;Frequency remains appropriate    Co-evaluation                 AM-PAC OT "6 Clicks" Daily Activity     Outcome Measure   Help from another person eating meals?: None Help from  another person taking care of personal grooming?: A Little Help from another person toileting, which includes using toliet, bedpan, or urinal?: A Little Help from another person bathing (including washing, rinsing, drying)?: A Lot Help from another person to put on and taking off regular upper body clothing?: A Little Help from another person to put on and taking off regular lower body clothing?: A Lot 6 Click Score: 17    End of Session Equipment Utilized During Treatment: Rolling walker  OT Visit Diagnosis: Unsteadiness on feet (R26.81);Other abnormalities of gait and mobility (R26.89)   Activity Tolerance Patient tolerated treatment well   Patient Left with call bell/phone within reach;with bed alarm set;Other (comment) (seated EOB)   Nurse Communication Mobility status        Time: 1254-1320 OT Time Calculation (min): 26 min  Charges: OT General Charges $OT Visit: 1 Visit OT Treatments $Self Care/Home Management : 23-37 mins  [place] Pager [phone] Office [phone]    Delight Stare 05/20/2021, 1:36 PM

## 2021-05-20 NOTE — Progress Notes (Signed)
Went over discharge instructions with the patient and patient's family members at the bedside. Patient is aware of the follow up appointment with cardiology and is aware of the [facility], [place], and HHOT from [facility] services. Tele monitor has been removed and CCMD has been notified. Patient PIV has been removed. Patient is dressed. Transport is at the bedside.

## 2021-05-20 NOTE — Discharge Summary (Signed)
Physician Discharge Summary  Scott [name] WJX:[id number] DOB: 1933/03/11 DOA: 05/04/2021  PCP: Reubin Milan, MD  Admit date: 05/04/2021 Discharge date: 05/20/2021  Admitted From: Home  Disposition: Home   Recommendations for Outpatient Follow-up and new medication changes:  Follow up with Dr. Marjo Bicker in 7 to 10 days.  Follow up with Cardiology as scheduled. Follow up with Nephrology at the [facility] on 06/04/21 at 11:20 am.  Continue torsemide 20 mg bid and to increase to tid in case of volume overload.    Home Health: yes   Equipment/Devices: na    Discharge Condition: stable  CODE STATUS: DNR   Diet recommendation:  heart healthy   Brief/Interim Summary: Scott [name] was admitted to the hospital with the working diagnosis of acute on chronic decompensation of heart failure.   87 year old male past medical history for systolic heart failure, coronary artery disease, hypertension, chronic kidney disease, dyslipidemia, peripheral vascular disease, GERD, chronic iron deficiency, thrombocytopenia, and gout who presented with dyspnea.  At home progressive weight gain, along with dyspnea, no orthopnea.  His symptoms were refractive to increased dose of torsemide as outpatient.  On his initial physical examination blood pressure 129/73, heart rate 71, respirate 17, oxygen saturation 97% on room air, his lungs are rales, expiratory wheezing, heart S1-S2, present, rhythmic, soft abdomen, bilateral extremity edema.  Sodium 134, potassium 4.4, chloride 96, bicarb 27, glucose 169, BUN 71, creatinine 2.42, BNP 2626, white count 7.3, hemoglobin 10.0, hematocrit 35.2, platelets 122. SARS COVID-19 negative.  Chest radiograph with cardiomegaly, hilar vascular congestion, bibasilar reticular interstitial infiltrates.  EKG 71 bpm, rightward axis, first-degree AV block, sinus rhythm, no significant ST segment changes, negative T wave lead II-III, aVF  Patient was admitted to the telemetry ward, received  intravenous furosemide for diuresis along with oral metolazone.  For his peripheral vascular disease he underwent angiography and ankle-brachial index.  No significant stenosis.  Discharge Diagnoses:  Principal Problem:   Acute exacerbation of CHF (congestive heart failure) (HCC) Active Problems:   Hypertension   Dyslipidemia   Arteriosclerotic coronary artery disease   Diabetes mellitus, type II, insulin dependent (HCC)   OSA (obstructive sleep apnea)   Acute on chronic combined systolic and diastolic CHF (congestive heart failure) (HCC)   PAD (peripheral artery disease) (HCC)   CKD (chronic kidney disease) stage 4, GFR 15-29 ml/min (HCC)   Iron deficiency anemia   Thrombocytopenia (HCC)   Prolonged QT interval   Ascites   Acute on chronic systolic heart failure exacerbation.  Patient was admitted to the medical ward, he received intravenous furosemide for hypervolemia. Negative fluid balance was achieved, since admission -7458 mL with significant improvement of his symptoms.  Ultrasonography of the abdomen showed increased hepatic parenchymal echogenicity suggesting cirrhosis, small volume ascites in the upper abdomen, small to moderate ascites in both upper quadrants. Possible congestive hepatopathy related to chronic heart failure.  Medical regimen for heart failure is limited due to decreased GFR. Patient been discharged on torsemide 20 mg twice daily, he has Unna boots in place. Instructions to increase torsemide to 3 times daily increase of weight gain, 3 pounds and 48 hours or 5 pounds in 7 days, worsening dyspnea and lower extremity edema. Metolazone 2,5 mg daily.   Continue carvedilol.  2.  Acute kidney injury and chronic kidney disease stage IV.  Patient underwent aggressive diuresis, his creatinine peaked at 2.97. At discharge is 2.93. Plan to continue torsemide 20 g daily, he does have follow-up as an outpatient with nephrology in  2 weeks. Continue metolazone 2.5 g  daily.  3.  Type 2 diabetes mellitus.  His glucose remained stable, patient received insulin sliding scale during his hospitalization.  4.  Chronic thrombocytopenia/ iron deficiency anemia.  Follow-up as an outpatient. Continue oral iron supplementation.   5.  Peripheral vascular disease continue medical therapy.   Discharge Instructions  Discharge Instructions     Face-to-face encounter (required for Medicare/Medicaid patients)   Complete by: As directed    I Scott [name] certify that this patient is under my care and that I, or a nurse practitioner or physician's assistant working with me, had a face-to-face encounter that meets the physician face-to-face encounter requirements with this patient on 05/15/2021. The encounter with the patient was in whole, or in part for the following medical condition(s) which is the primary reason for home health care (List medical condition): Debility, CHF   The encounter with the patient was in whole, or in part, for the following medical condition, which is the primary reason for home health care: Debility, CHF   I certify that, based on my findings, the following services are medically necessary home health services:  Nursing Physical therapy     Reason for Medically Necessary Home Health Services:  Skilled Nursing- Change/Decline in Patient Status Skilled Nursing- Skilled Assessment/Observation Therapy- Personnel officer, Training and development officer and Stair Training     My clinical findings support the need for the above services: Shortness of breath with activity   Further, I certify that my clinical findings support that this patient is homebound due to: Shortness of Breath with activity   Home Health   Complete by: As directed    To provide the following care/treatments:  PT OT RN        Allergies as of 05/20/2021   No Known Allergies      Medication List     TAKE these medications    ALPRAZolam 0.25 MG tablet Commonly known as: XANAX Take  0.25 mg by mouth at bedtime.   aspirin 81 MG EC tablet Take 81 mg by mouth at bedtime.   atorvastatin 20 MG tablet Commonly known as: LIPITOR Take 1 tablet (20 mg total) by mouth daily.   carvedilol 12.5 MG tablet Commonly known as: COREG Take 6.25 mg by mouth 2 (two) times daily.   colchicine 0.6 MG tablet Take 0.6-1.2 mg by mouth See admin instructions. Take 2 tablets (1.2 mg) by mouth at onset of gout attack, may take 1 tablet (0.6 mg) one hour later if still needed. Max 3 tablets in 3 days   docusate sodium 100 MG capsule Commonly known as: COLACE Take 100 mg by mouth daily as needed for mild constipation.   Febuxostat 80 MG Tabs Take 40 mg by mouth daily.   ferrous sulfate 325 (65 FE) MG tablet Take 325 mg by mouth every Monday, Wednesday, and Friday.   fluticasone 50 MCG/ACT nasal spray Commonly known as: FLONASE Place 1 spray into both nostrils daily as needed for allergies.   insulin aspart protamine- aspart (70-30) 100 UNIT/ML injection Commonly known as: NOVOLOG MIX 70/30 Inject 30 Units into the skin daily as needed (as per blood sugar level).   latanoprost 0.005 % ophthalmic solution Commonly known as: XALATAN Place 1 drop into both eyes at bedtime.   metolazone 2.5 MG tablet Commonly known as: ZAROXOLYN Take 1 tablet (2.5 mg total) by mouth daily. Start taking on: May 21, 2021   multivitamin with minerals Tabs tablet Take 1 tablet by  mouth daily.   nitroGLYCERIN 0.4 MG SL tablet Commonly known as: NITROSTAT Place 1 tablet (0.4 mg total) under the tongue every 5 (five) minutes as needed for chest pain (up to 3 doses).   oxyCODONE-acetaminophen 5-325 MG tablet Commonly known as: PERCOCET/ROXICET Take 1 tablet by mouth every 6 (six) hours as needed for severe pain.   PRESCRIPTION MEDICATION Inhale into the lungs at bedtime. CPAP   tamsulosin 0.4 MG Caps capsule Commonly known as: FLOMAX Take 0.4 mg by mouth at bedtime.   torsemide 20 MG  tablet Commonly known as: DEMADEX Take 1 tablet (20 mg total) by mouth 2 (two) times daily. Take 3 times daily in case of worsening leg swelling, shortness of breath or weigh gain 3 lbs in 2 days or 5 lbs in seven days. What changed:  how much to take how to take this when to take this additional instructions   Vitamin D 50 MCG (2000 UT) tablet Take 2,000 Units by mouth every Monday, Wednesday, and Friday at [place] [place] Follow up.   Specialty: Home Health Services Why: HHRN,HHPT, [facility] Contact information: [place] [address] [phone]                No Known Allergies  Consultations: Cardiology  Vascular surgery    Procedures/Studies: DG Chest 1 View  Result Date: 05/04/2021 CLINICAL DATA:  Shortness of breath and chest pain EXAM: CHEST  1 VIEW COMPARISON:  None. FINDINGS: Unchanged, enlarged cardiac silhouette with postsurgical changes of CABG. There is pulmonary vascular congestion and continued bibasilar reticular and airspace opacities. Small right pleural effusions. No visible pneumothorax. No acute osseous abnormality. IMPRESSION: Cardiac with pulmonary vascular congestion and persistent bibasilar reticular and airspace opacities likely representing edema. Small right pleural effusion. Electronically Signed   By: Maurine Simmering   On: 05/04/2021 19:56   PERIPHERAL VASCULAR CATHETERIZATION  Result Date: 05/11/2021 Images from the original result were not included. Patient name: Scott [name] MRN: [id number] DOB: [birth year] Sex: male 05/11/2021 Pre-operative Diagnosis: Peripheral arterial disease with left foot ulceration Post-operative diagnosis:  Same Surgeon:  Eda Paschal. Donzetta Matters, MD Procedure Performed: 1.  Ultrasound-guided cannulation right common femoral artery 2.  CO2 aortogram and left lower extremity angiography 3.  Selection of left popliteal artery and below the knee  contrasted angiogram 4.  Mynx device closure right common femoral artery Indications: 87 year old male with new ulceration of his left foot.  He does have venous stasis ulcer of the right lower extremity he has undergone angiography there with no intervention.  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  He is indicated for angiography with possible intervention and given chronic kidney disease we will plan to use CO2. Findings: The aorta and iliac segments appear patent.  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  Bilateral renal arteries are patent.  Left common femoral artery does have some calcific disease as well as the SFA but there is no flow-limiting stenosis.  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  Below the knee we did evaluate with contrast there is a 50% stenosis of the anterior tibial artery at the takeoff but there is brisk flow all the way to the foot filling the arch only via the anterior tibial artery.  The peroneal and posterior tibial artery are not visualized.  No intervention was undertaken.  Procedure:  The patient was identified in the holding area and taken to room 8.  The patient was then placed supine on the table and prepped and draped in the usual sterile fashion.  A time out was called.  Ultrasound was used to evaluate the right common femoral artery.  This was noted to be somewhat ectatic.  The areas anesthetized 1% lidocaine cannulated micropuncture needle followed by wire and a sheath.  Images saved the permanent record.  Bentson wires placed followed by 5 [place] sheath.  Omni catheter was placed to the level of L1 aortogram performed with CO2.  We then crossed bifurcation with Omni catheter Bentson wire placed the Omni catheter in the left common femoral artery and left lower extremity angiography was performed with CO2.  We then used a Glidewire straight catheter to the level of the below-knee popliteal artery  and used contrast with 2 injections below the knee with the above findings.  We elected for no intervention.  The catheter was removed.  Main device was deployed.  He tolerated procedure without any complication. Contrast: 10 cc Brandon C. Donzetta Matters, MD [facility] Office: [phone] Pager: [phone]   VAS [place] ABI WITH/WO TBI  Result Date: 05/08/2021  LOWER EXTREMITY DOPPLER STUDY Patient Name:  Scott [name]  Date of Exam:   05/08/2021 Medical Rec #: [id number]     Accession #:    [id number] Date of Birth: Oct 15, 1932     Patient Gender: M Patient Age:   88Y Exam Location:  [facility] Procedure:      VAS [place] ABI WITH/WO TBI Referring Phys: [id number] Karoline Caldwell --------------------------------------------------------------------------------  Indications: Ulceration. High Risk Factors: Hypertension, Diabetes.  Limitations: Today's exam was limited due to patient positioning, involuntary              patient movement and Poor ultrasound/tissue interface. Comparison Study: 09/24/2020 - ABI - R Emmett L Buckhorn w/ Monophasic flow Performing Technologist: Carlos Levering RVT  Examination Guidelines: A complete evaluation includes at minimum, Doppler waveform signals and systolic blood pressure reading at the level of bilateral brachial, anterior tibial, and posterior tibial arteries, when vessel segments are accessible. Bilateral testing is considered an integral part of a complete examination. Photoelectric Plethysmograph (PPG) waveforms and toe systolic pressure readings are included as required and additional duplex testing as needed. Limited examinations for reoccurring indications may be performed as noted.  ABI Findings: +---------+------------------+-----+----------+--------------------+ Right    Rt Pressure (mmHg)IndexWaveform  Comment              +---------+------------------+-----+----------+--------------------+ Brachial 118                    biphasic                        +---------+------------------+-----+----------+--------------------+ PTA      71                0.59 monophasic                     +---------+------------------+-----+----------+--------------------+ DP       254               2.12 monophasic                     +---------+------------------+-----+----------+--------------------+ Great Toe  Great toe amputation +---------+------------------+-----+----------+--------------------+ +---------+------------------+-----+----------+-------+ Left     Lt Pressure (mmHg)IndexWaveform  Comment +---------+------------------+-----+----------+-------+ Brachial 120                    biphasic          +---------+------------------+-----+----------+-------+ PTA      74                0.62 monophasic        +---------+------------------+-----+----------+-------+ DP       152               1.27 monophasic        +---------+------------------+-----+----------+-------+ Great Toe0                 0.00                   +---------+------------------+-----+----------+-------+ +-------+-----------+-----------+------------+------------+ ABI/TBIToday's ABIToday's TBIPrevious ABIPrevious TBI +-------+-----------+-----------+------------+------------+ Right  0.59                  [place]                       +-------+-----------+-----------+------------+------------+ Left   1.27                  [place]                       +-------+-----------+-----------+------------+------------+  Summary: Right: Resting right ankle-brachial index indicates moderate right lower extremity arterial disease. 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 Left: Resting left ankle-brachial index is within normal range. No evidence of significant left lower extremity arterial disease. Values are likely falsely elevated due to medial calcification. Unable to obtain TBI due to low amplitude/absent waveforms.  *See  table(s) above for measurements and observations.  Electronically signed by Deitra Mayo MD on 05/08/2021 at 5:24:59 PM.    Final    VAS [place] LOWER EXTREMITY ARTERIAL DUPLEX  Result Date: 05/08/2021 LOWER EXTREMITY ARTERIAL DUPLEX STUDY Patient Name:  KHADEEM ROCKETT Delahoussaye  Date of Exam:   05/08/2021 Medical Rec #: [id number]     Accession #:    [id number] Date of Birth: Jun 27, 1933     Patient Gender: M Patient Age:   88Y Exam [facility]:  [facility] Procedure:      VAS [place] LOWER EXTREMITY ARTERIAL DUPLEX Referring Phys: [id number] Karoline Caldwell --------------------------------------------------------------------------------  Indications: Ulceration. High Risk Factors: Hypertension, Diabetes.  Current ABI: R 0.59 L 1.27 Limitations: Poor ultrasound/tissue interface, patient positioning, patient              movement Comparison Study: No prior studies. Performing Technologist: Oliver Hum RVT  Examination Guidelines: A complete evaluation includes B-mode imaging, spectral Doppler, color Doppler, and power Doppler as needed of all accessible portions of each vessel. Bilateral testing is considered an integral part of a complete examination. Limited examinations for reoccurring indications may be performed as noted.   +-----------+--------+-----+--------+----------+--------+ LEFT       PSV cm/sRatioStenosisWaveform  Comments +-----------+--------+-----+--------+----------+--------+ CFA Distal 67                   biphasic           +-----------+--------+-----+--------+----------+--------+ DFA        58                   biphasic           +-----------+--------+-----+--------+----------+--------+ SFA Prox   102  monophasic         +-----------+--------+-----+--------+----------+--------+ SFA Mid    98                   monophasic         +-----------+--------+-----+--------+----------+--------+ SFA Distal 49                   monophasic          +-----------+--------+-----+--------+----------+--------+ POP Prox   31                   monophasic         +-----------+--------+-----+--------+----------+--------+ POP Distal 76                   monophasic         +-----------+--------+-----+--------+----------+--------+ ATA Prox   91                   monophasic         +-----------+--------+-----+--------+----------+--------+ ATA Mid    56                   monophasic         +-----------+--------+-----+--------+----------+--------+ ATA Distal 57                   monophasic         +-----------+--------+-----+--------+----------+--------+ PTA Prox   44                   monophasic         +-----------+--------+-----+--------+----------+--------+ PTA Mid    62                   monophasic         +-----------+--------+-----+--------+----------+--------+ PTA Distal 19                   monophasic         +-----------+--------+-----+--------+----------+--------+ PERO Prox  34                   monophasic         +-----------+--------+-----+--------+----------+--------+ PERO Mid   23                   monophasic         +-----------+--------+-----+--------+----------+--------+ PERO Distal0                    monophasic         +-----------+--------+-----+--------+----------+--------+  Summary: Left: Monophasic waveforms are noted throughout the left arterial system starting in the proximal superficial femoral artery.  See table(s) above for measurements and observations. Electronically signed by Deitra Mayo MD on 05/08/2021 at 5:24:28 PM.    Final    US Abdomen Limited RUQ (LIVER/GB)  Result Date: 05/07/2021 CLINICAL DATA:  Ascites. EXAM: ULTRASOUND ABDOMEN LIMITED RIGHT UPPER QUADRANT COMPARISON:  Abdominopelvic CT 05/08/2020 FINDINGS: Gallbladder: Surgically absent. Common bile duct: Diameter: 4 mm, normal. Liver: Heterogeneous and mildly increased parenchymal echogenicity.  There is slight capsular nodularity. No evidence of focal lesion. Pneumobilia on prior exam is not confidently seen. Portal vein is patent on color Doppler imaging with normal direction of blood flow towards the liver. Other: Small volume ascites in the right and left upper quadrants, small to moderate ascites in the right and left lower quadrants. IMPRESSION: 1. Slight capsular nodularity and increased hepatic parenchymal echogenicity suggesting cirrhosis. No focal lesion. Pneumobilia on prior CT is not demonstrated  on the current exam. 2. Small volume ascites in the upper abdomen, small to moderate ascites in both lower quadrants. 3. Cholecystectomy without biliary dilatation. Electronically Signed   By: Keith Rake M.D.   On: 05/07/2021 19:49     Procedures: CO2 arteriogram and left lower extremity angiography.  Subjective: Patient is feeling better, no dyspnea or chest pain, no nausea or vomiting.   Discharge Exam: Vitals:   05/20/21 0323 05/20/21 1033  BP: 110/68 110/66  Pulse: 62 62  Resp: 18   Temp: 97.6 F (36.4 C) 98 F (36.7 C)  SpO2: 100% 97%   Vitals:   05/19/21 2346 05/20/21 0255 05/20/21 0323 05/20/21 1033  BP:   110/68 110/66  Pulse: 76  62 62  Resp: 14  18   Temp:   97.6 F (36.4 C) 98 F (36.7 C)  TempSrc:   Oral Oral  SpO2: 96%  100% 97%  Weight:  91.5 kg    Height:        General: Not in pain or dyspnea  Neurology: Awake and alert, non focal  E ENT: no pallor, no icterus, oral mucosa moist Cardiovascular: No JVD. S1-S2 present, rhythmic, no gallops, rubs, or murmurs. Unna boots in place, 1+ pitting lower extremity edema. Pulmonary: positive breath sounds bilaterally, adequate air movement, no wheezing, rhonchi or rales. Gastrointestinal. Abdomen soft and non tender Skin. No rashes Musculoskeletal: no joint deformities   The results of significant diagnostics from this hospitalization (including imaging, microbiology, ancillary and laboratory) are  listed below for reference.     Microbiology: No results found for this or any previous visit (from the past 240 hour(s)).   Labs: BNP (last 3 results) Recent Labs    04/09/21 1208 04/27/21 1237 05/04/21 1931  BNP 2,193.6* 5,784.6* 9,629.5*   Basic Metabolic Panel: Recent Labs  Lab 05/15/21 2841 05/16/21 0426 05/17/21 0312 05/18/21 0246 05/19/21 0218 05/20/21 0300  NA 136 135 136 135 132* 131*  K 3.8 3.6 3.9 3.8 3.5 3.9  CL 92* 90* 90* 87* 87* 86*  CO2 36* 36* 37* 36* 35* 34*  GLUCOSE 127* 120* 139* 165* 153* 158*  BUN 76* 76* 79* 81* 93* 100*  CREATININE 2.34* 2.38* 2.44* 2.57* 2.97* 2.93*  CALCIUM 8.9 9.0 9.0 9.4 8.7* 8.7*  MG 2.0 2.0  --   --  2.1  --   PHOS  --   --   --   --   --  3.0   Liver Function Tests: Recent Labs  Lab 05/20/21 0300  ALBUMIN 2.6*   No results for input(s): LIPASE, AMYLASE in the last 168 hours. No results for input(s): AMMONIA in the last 168 hours. CBC: Recent Labs  Lab 05/16/21 0426 05/18/21 0246  WBC 6.0 8.3  NEUTROABS 3.4 7.1  HGB 9.5* 11.0*  HCT 31.7* 36.7*  MCV 94.9 94.8  PLT 94* 133*   Cardiac Enzymes: No results for input(s): CKTOTAL, CKMB, CKMBINDEX, TROPONINI in the last 168 hours. BNP: Invalid input(s): POCBNP CBG: Recent Labs  Lab 05/19/21 0559 05/19/21 1107 05/19/21 1625 05/19/21 2106 05/20/21 0611  GLUCAP 131* 158* 174* 201* 143*   D-Dimer No results for input(s): DDIMER in the last 72 hours. Hgb A1c No results for input(s): HGBA1C in the last 72 hours. Lipid Profile No results for input(s): CHOL, HDL, LDLCALC, TRIG, CHOLHDL, LDLDIRECT in the last 72 hours. Thyroid function studies No results for input(s): TSH, T4TOTAL, T3FREE, THYROIDAB in the last 72 hours.  Invalid input(s): FREET3 Anemia  work up No results for input(s): VITAMINB12, FOLATE, FERRITIN, TIBC, IRON, RETICCTPCT in the last 72 hours. Urinalysis    Component Value Date/Time   COLORURINE YELLOW 03/10/2021 1029   APPEARANCEUR  CLEAR 03/10/2021 1029   LABSPEC 1.010 03/10/2021 1029   PHURINE 5.0 03/10/2021 1029   GLUCOSEU NEGATIVE 03/10/2021 1029   [place] 03/10/2021 [place] 03/10/2021 [place] 03/10/2021 [place] 03/10/2021 1029   UROBILINOGEN 1.0 08/31/2012 1735   NITRITE NEGATIVE 03/10/2021 1029   LEUKOCYTESUR NEGATIVE 03/10/2021 1029   Sepsis Labs Invalid input(s): PROCALCITONIN,  WBC,  LACTICIDVEN Microbiology No results found for this or any previous visit (from the past 240 hour(s)).   Time coordinating discharge: 45 minutes  SIGNED:   Tawni Millers, MD  [facility] 05/20/2021, 10:39 AM

## 2021-05-21 ENCOUNTER — Other Ambulatory Visit: Payer: Self-pay

## 2021-05-21 NOTE — Patient Outreach (Signed)
[facility]) Care Management  05/21/2021  Scott [name] 1933/01/02 [id number]   Date of Review: 05/21/21 Reason:  Readmission  PCP: Dr. Reubin Milan Insurance: [employer]  Medical Info: Scott [name] is a 87 y.o. male past medical history of chronic thrombocytopenia, diabetes mellitus type 2, essential hypertension chronic kidney disease stage III, chronic combined systolic and diastolic heart failure iron deficiency anemia. Patient lives in the home alone but has support of brother Scott [name] who lives nearby. Brother Scott [name] takes him to appointments and does grocery shopping.  Patient uses walker and cane for ambulation. He is independent with ADL's. Patient manages his medications independently. Patient also has pending aides through the [place].    Admissions:  Patient admitted 03/09/21 to 03/16/21 presented to the hospital complaining of comes into the hospital for shortness of breath and several days of black tarry stool.  In the ED was found to have a hemoglobin of 6 GI was consulted and admitted to the hospital. Patient found to have acute upper GI bleed and worsening renal function.   Patient discharged to SNF for strengthening.  He was discharged from SNF on 04/04/21 home with home health.      Patient admitted on 04/09/21 to 04/14/21 presented with dyspnea and lower extremity edema after advisement from cardiology. Patient diagnosed with acute congestive heart failure   Patient treated with IV Lasix and switched to torsemide for heart failure management. He was discharged home with home health.   Patient re-admitted again in 05/04/21 for heart failure. Patient weight up to 225 lbs despite increasing diuretics several times by the cardiologist.  Diuresing patient has still been an issue in the hospital.  Patient finally responded to Lasix drip around hospital day number 5.  Patient is a DNR.  Recommendation is Home with Home Health.  05/18/21 Patient remains hospitalized on Lasix drip.  Patient  end stage heart failure. GOC and Palliative Care discussions recommended.   05/20/21 Discharged to home  Disposition: Patient discharged home with home health.  Recommendation: CM to have conversation about dialysis vs. palliative care.  Jone Baseman, RN, MSN [place] Management Care Management Coordinator Direct Line [phone] Cell [phone] Toll Free: [phone]  Fax: [phone]

## 2021-05-21 NOTE — Patient Outreach (Signed)
[place]) Care Management  05/21/2021  KELCY BAETEN Mar 01, 1933 [id number]   Telephone call to patient for follow up from hospitalization.  No answer. HIPAA compliant voice message left.    Plan: RN CM will attempt again within 4 business days.    Jone Baseman, RN, MSN [place] Management Care Management Coordinator Direct Line [phone] Cell [phone] Toll Free: [phone]  Fax: [phone]

## 2021-05-25 ENCOUNTER — Other Ambulatory Visit: Payer: Self-pay

## 2021-05-25 NOTE — Patient Outreach (Signed)
[name] [facility]) Care Management  05/25/2021  Scott [name] September 22, 1933 [id number]   Telephone call to patient for follow up. No answer.  Unable to leave a voice message.  Plan: RN CM will attempt again within 4 business days and send letter.  Jone Baseman, RN, MSN [place] Management Care Management Coordinator Direct Line [phone] Cell [phone] Toll Free: [phone]  Fax: [phone]

## 2021-05-27 ENCOUNTER — Other Ambulatory Visit: Payer: Self-pay

## 2021-05-27 NOTE — Patient Outreach (Signed)
[facility]) Care Management  05/27/2021  MATHAYUS STANBERY March 15, 1933 [id number]   Telephone call to patient no answer. Number not in service.  Telephone call to brother Dwon Sky. He states patient deceased on 06/08/2021.  Condolence offered.    Plan: RN CM will close case.    Jone Baseman, RN, MSN [place] Management Care Management Coordinator Direct Line [phone] Cell [phone] Toll Free: [phone]  Fax: [phone]

## 2021-06-04 ENCOUNTER — Ambulatory Visit: Payer: Medicare HMO | Admitting: Cardiology

## 2021-06-11 DIAGNOSIS — 419620001 Death: Secondary | SNOMED CT | POA: Diagnosis not present

## 2021-06-11 DEATH — deceased

## 2021-12-22 IMAGING — CT CT ABD-PELV W/O CM
2 of 4 series · 15 of 46 positions shown, 17 images · non-contrast
Comparison: CT abdomen pelvis dated 12/30/2018.

CLINICAL DATA: 87-year-old male with abdominal pain.

EXAM:
CT ABDOMEN AND PELVIS WITHOUT CONTRAST
TECHNIQUE: Multidetector CT imaging of the abdomen and pelvis was performed
following the standard protocol without IV contrast.

[Series 3: a/p w/o 5mm · axial · non-contrast · 0.85mm/px · z∈[+232,+712]mm · 12 of 108 slices shown, 14 images]
[im 6/108  soft-tissue]
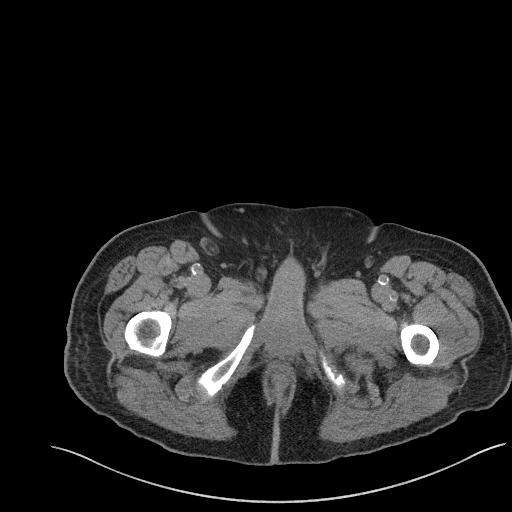
[im 6/108  bone]
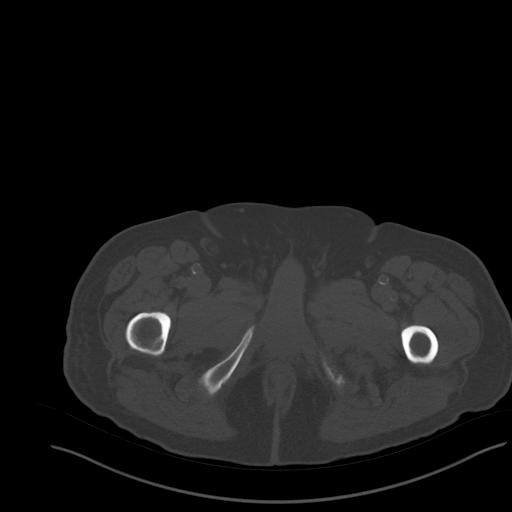
[im 16/108  soft-tissue]
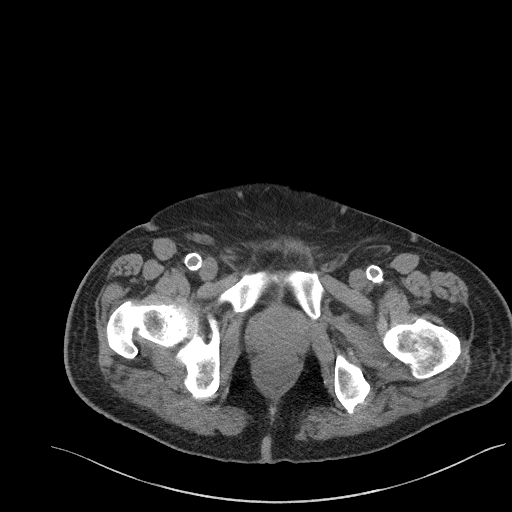
[im 26/108  soft-tissue]
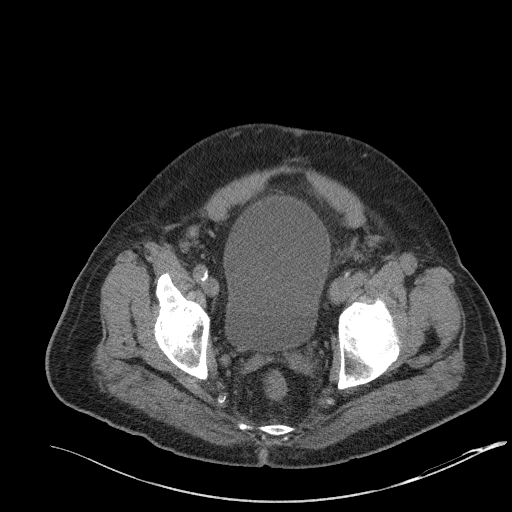
[im 31/108  soft-tissue]
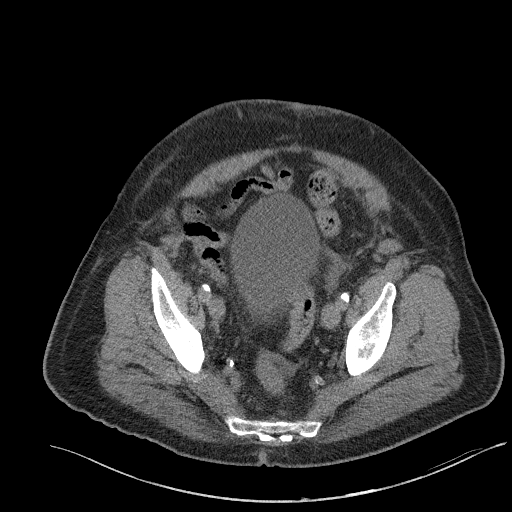
[im 41/108  soft-tissue]
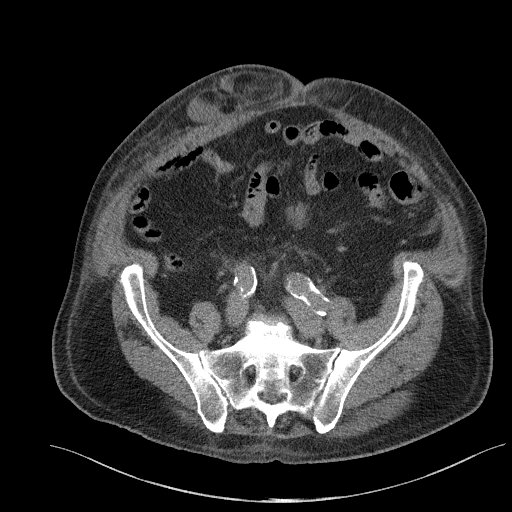
[im 51/108  soft-tissue]
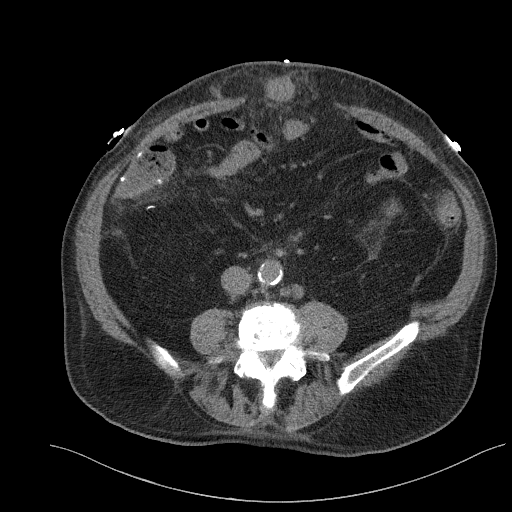
[im 57/108  soft-tissue]
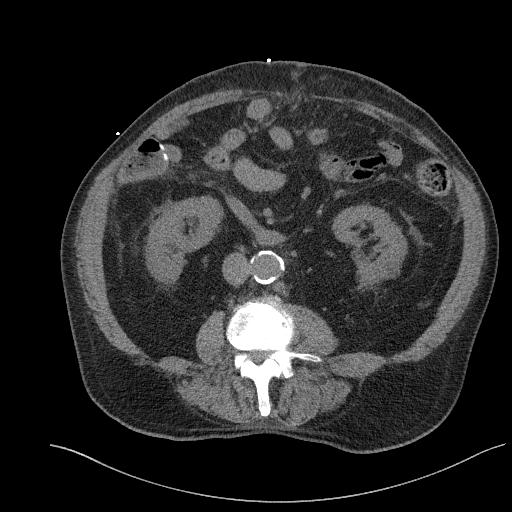
[im 67/108  soft-tissue]
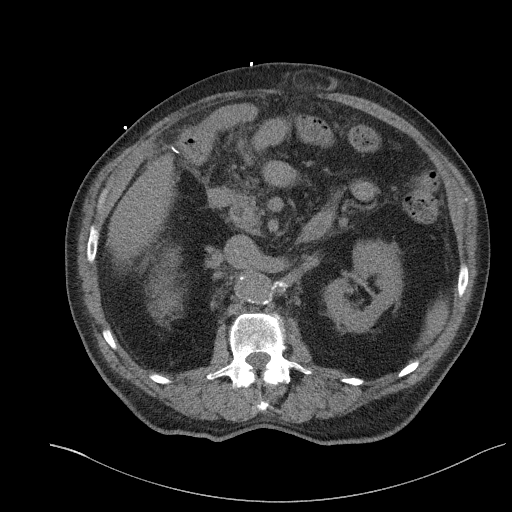
[im 77/108  soft-tissue]
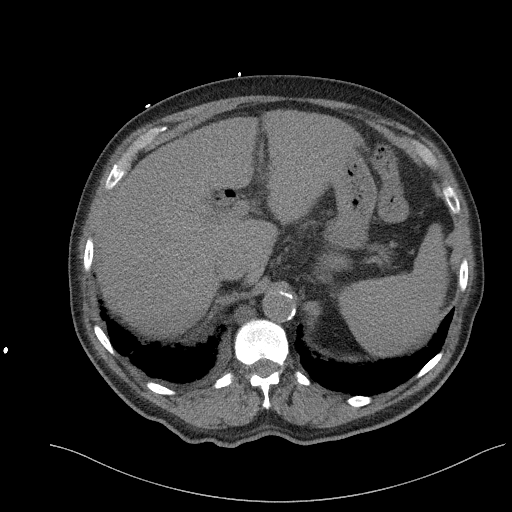
[im 77/108  bone]
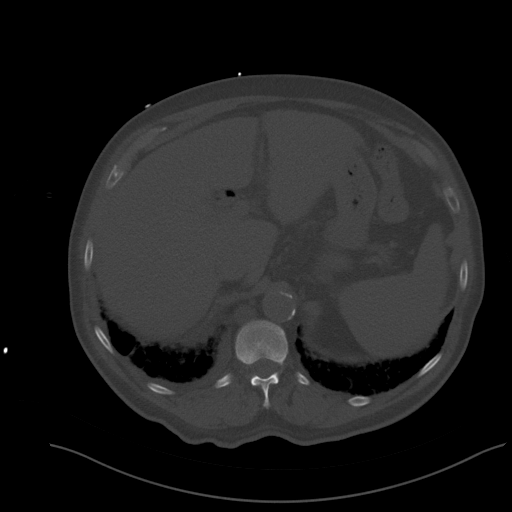
[im 82/108  soft-tissue]
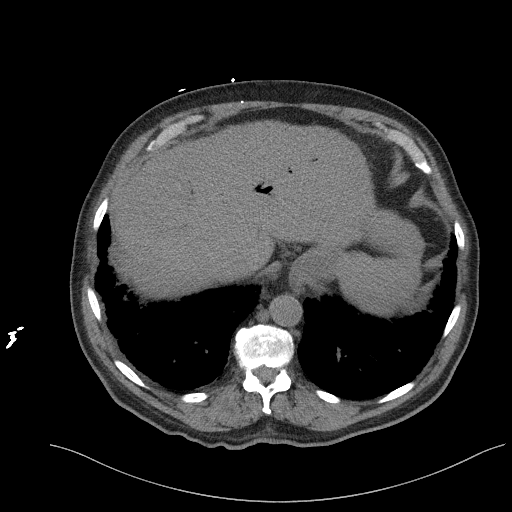
[im 92/108  soft-tissue]
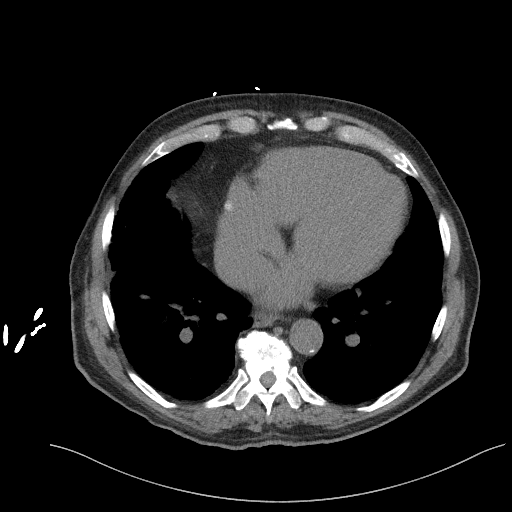
[im 102/108  soft-tissue]
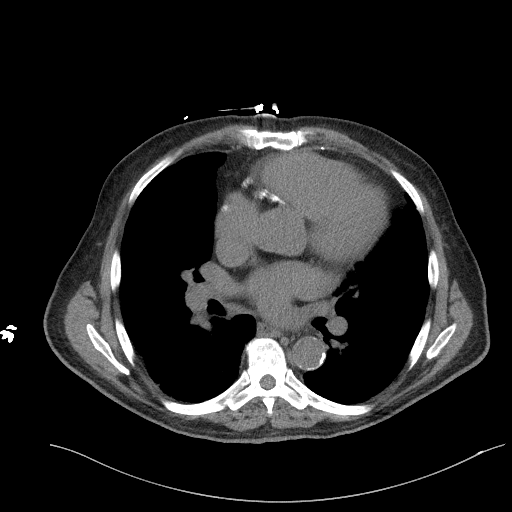

[Series 6: a/p w/o cor · coronal · non-contrast · 0.91mm/px · 3 of 166 slices shown]
[im 56/166  soft-tissue]
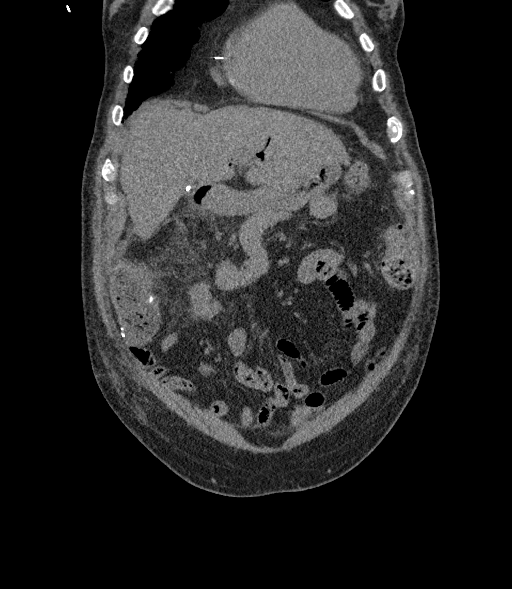
[im 74/166  soft-tissue]
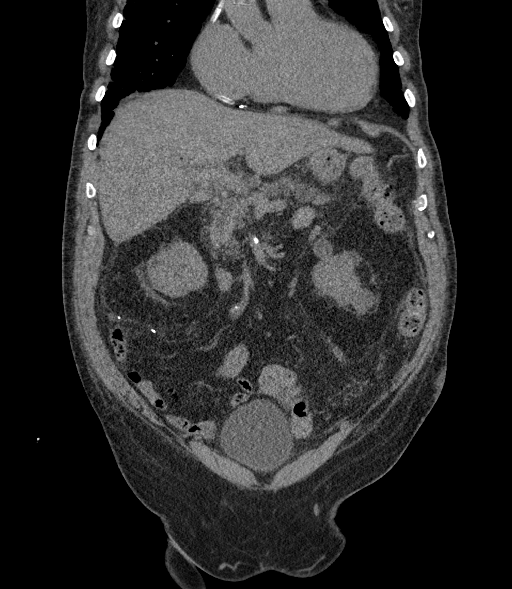
[im 92/166  soft-tissue]
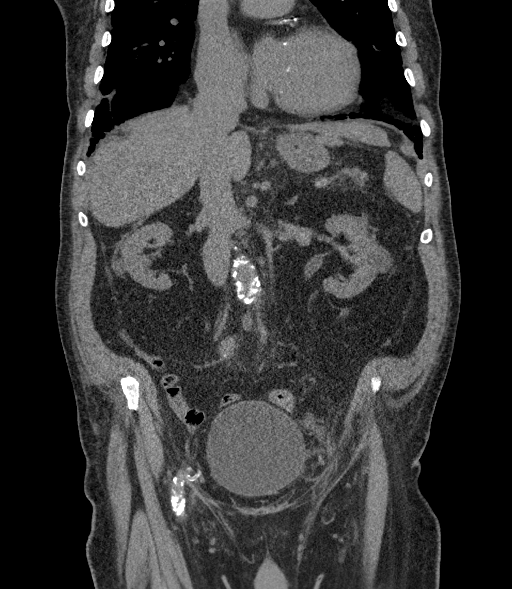

[15 of 46 positions shown; findings below may reference images not displayed]

FINDINGS: Evaluation of this exam is limited in the absence of intravenous
contrast.

Lower chest: There is diffuse interstitial coarsening and subpleural
reticulation and mild bibasilar bronchiectasis, likely representing
interstitial lung disease. There is mild cardiomegaly with coronary
vascular calcification and postsurgical changes of CABG.

No intra-abdominal free air or free fluid.

Hepatobiliary: Mild irregularity of the liver contour may represent
early changes of cirrhosis. Clinical correlation is recommended.
There is cholecystectomy and pneumobilia. Faint high attenuation in
the central CBD (coronal 74/6) is suboptimally evaluated and may be
artifactual or represent noncalcified stone or sludge within the
central CBD. An ampullary lesion is not excluded. MRCP may provide
better evaluation.

Pancreas: Mild haziness of the peripancreatic fat, likely related to
diffuse mesenteric haziness. Correlation with pancreatic enzymes
recommended to exclude acute pancreatitis.

Spleen: Normal in size without focal abnormality.

Adrenals/Urinary Tract: The adrenal glands unremarkable. There is no
hydronephrosis or nephrolithiasis on either side. Renal vascular
calcifications noted. Multiple bilateral renal cysts measuring up to
2.5 cm. These are similar or slightly increased in size since the
prior CT. Ultrasound may provide better evaluation. The visualized
ureters and urinary bladder appear unremarkable.

Stomach/Bowel: There is postsurgical changes of bowel resection with
anastomotic suture in the right hemiabdomen. There is no bowel
obstruction or active inflammation. There is herniation of a short
segment of small bowel into the ventral hernia as seen previously.
No associated obstruction.

Vascular/Lymphatic: Advanced aortoiliac atherosclerotic disease. The
aorta is aneurysmal measuring up to 3 cm in diameter. The IVC is
unremarkable. No portal venous gas. There is no adenopathy.

Reproductive: The prostate and seminal vesicles are grossly
unremarkable.

Other: There is diffuse mesenteric edema and stranding. There is
midline vertical anterior abdominal wall incisional scar. There is a
multiloculated ventral hernia with the neck of the hernia measuring
approximately 7.5 cm. There is stranding of the herniated fat and
small amount of fluid within the hernia.

Musculoskeletal: Osteopenia with degenerative changes of the spine.
No acute osseous pathology.
IMPRESSION: 1. Ventral hernia containing a short segment of small bowel without
associated obstruction.
2. Postsurgical changes of bowel resection with anastomotic suture
in the right hemiabdomen. No bowel obstruction.
3. Diffuse mesenteric stranding and edema of indeterminate etiology,
possibly related to underlying liver disease/cirrhosis. Clinical
correlation is recommended.
4. Mild haziness of the peripancreatic fat, likely related to
diffuse mesenteric haziness. Correlation with pancreatic enzymes
recommended to exclude acute pancreatitis.
5. Faint high attenuation in the central CBD may be artifactual or
represent noncalcified stone or sludge within the central CBD. An
ampullary lesion is not excluded. MRCP may provide better
evaluation.
6. Aortic Atherosclerosis ([id number]).

## 2022-05-04 NOTE — Telephone Encounter (Signed)
error
